# Patient Record
Sex: Female | Born: 1960 | Race: White | Hispanic: No | Marital: Married | Smoking: Former smoker
Health system: Southern US, Community
[De-identification: ages and names within clinical notes are randomized; demographics above are authoritative.]

## PROBLEM LIST (undated history)

## (undated) DIAGNOSIS — N879 Dysplasia of cervix uteri, unspecified: Secondary | ICD-10-CM

## (undated) DIAGNOSIS — R51 Headache: Secondary | ICD-10-CM

## (undated) DIAGNOSIS — G894 Chronic pain syndrome: Secondary | ICD-10-CM

## (undated) DIAGNOSIS — R1110 Vomiting, unspecified: Secondary | ICD-10-CM

## (undated) DIAGNOSIS — I10 Essential (primary) hypertension: Secondary | ICD-10-CM

## (undated) DIAGNOSIS — J449 Chronic obstructive pulmonary disease, unspecified: Secondary | ICD-10-CM

## (undated) DIAGNOSIS — G2581 Restless legs syndrome: Secondary | ICD-10-CM

## (undated) DIAGNOSIS — N200 Calculus of kidney: Secondary | ICD-10-CM

## (undated) DIAGNOSIS — K219 Gastro-esophageal reflux disease without esophagitis: Secondary | ICD-10-CM

## (undated) DIAGNOSIS — E785 Hyperlipidemia, unspecified: Secondary | ICD-10-CM

## (undated) DIAGNOSIS — F329 Major depressive disorder, single episode, unspecified: Secondary | ICD-10-CM

## (undated) DIAGNOSIS — F419 Anxiety disorder, unspecified: Secondary | ICD-10-CM

## (undated) DIAGNOSIS — F32A Depression, unspecified: Secondary | ICD-10-CM

## (undated) DIAGNOSIS — K5792 Diverticulitis of intestine, part unspecified, without perforation or abscess without bleeding: Secondary | ICD-10-CM

## (undated) DIAGNOSIS — J189 Pneumonia, unspecified organism: Secondary | ICD-10-CM

## (undated) DIAGNOSIS — E119 Type 2 diabetes mellitus without complications: Secondary | ICD-10-CM

## (undated) DIAGNOSIS — M797 Fibromyalgia: Secondary | ICD-10-CM

## (undated) DIAGNOSIS — J42 Unspecified chronic bronchitis: Secondary | ICD-10-CM

## (undated) DIAGNOSIS — C541 Malignant neoplasm of endometrium: Secondary | ICD-10-CM

## (undated) HISTORY — DX: Pneumonia, unspecified organism: J18.9

## (undated) HISTORY — PX: PARTIAL HYSTERECTOMY: SHX80

## (undated) HISTORY — DX: Chronic obstructive pulmonary disease, unspecified: J44.9

## (undated) HISTORY — DX: Dysplasia of cervix uteri, unspecified: N87.9

## (undated) HISTORY — PX: COLOSTOMY: SHX63

## (undated) HISTORY — PX: APPENDECTOMY: SHX54

## (undated) HISTORY — DX: Gastro-esophageal reflux disease without esophagitis: K21.9

## (undated) HISTORY — PX: CHOLECYSTECTOMY: SHX55

## (undated) HISTORY — PX: ABDOMINAL HYSTERECTOMY: SHX81

---

## 2003-10-21 ENCOUNTER — Other Ambulatory Visit: Admission: RE | Admit: 2003-10-21 | Discharge: 2003-10-21 | Payer: Self-pay | Admitting: *Deleted

## 2007-03-08 ENCOUNTER — Emergency Department (HOSPITAL_COMMUNITY): Admission: EM | Admit: 2007-03-08 | Discharge: 2007-03-08 | Payer: Self-pay | Admitting: Emergency Medicine

## 2007-03-10 ENCOUNTER — Inpatient Hospital Stay (HOSPITAL_COMMUNITY): Admission: AD | Admit: 2007-03-10 | Discharge: 2007-03-14 | Payer: Self-pay | Admitting: Endocrinology

## 2009-06-22 ENCOUNTER — Inpatient Hospital Stay (HOSPITAL_COMMUNITY): Admission: EM | Admit: 2009-06-22 | Discharge: 2009-07-10 | Payer: Self-pay | Admitting: Emergency Medicine

## 2009-07-04 ENCOUNTER — Encounter (INDEPENDENT_AMBULATORY_CARE_PROVIDER_SITE_OTHER): Payer: Self-pay | Admitting: General Surgery

## 2009-11-20 ENCOUNTER — Encounter: Admission: RE | Admit: 2009-11-20 | Discharge: 2009-11-20 | Payer: Self-pay | Admitting: General Surgery

## 2010-03-20 ENCOUNTER — Emergency Department (HOSPITAL_COMMUNITY): Admission: EM | Admit: 2010-03-20 | Discharge: 2010-03-20 | Payer: Self-pay | Admitting: Emergency Medicine

## 2010-03-23 ENCOUNTER — Emergency Department (HOSPITAL_COMMUNITY): Admission: EM | Admit: 2010-03-23 | Discharge: 2010-03-23 | Payer: Self-pay | Admitting: Emergency Medicine

## 2010-04-26 ENCOUNTER — Emergency Department (HOSPITAL_COMMUNITY): Admission: EM | Admit: 2010-04-26 | Discharge: 2010-04-26 | Payer: Self-pay | Admitting: Emergency Medicine

## 2010-04-30 ENCOUNTER — Emergency Department (HOSPITAL_COMMUNITY): Admission: EM | Admit: 2010-04-30 | Discharge: 2010-04-30 | Payer: Self-pay | Admitting: Emergency Medicine

## 2010-10-11 ENCOUNTER — Encounter: Payer: Self-pay | Admitting: General Surgery

## 2010-11-30 ENCOUNTER — Emergency Department (HOSPITAL_COMMUNITY)
Admission: EM | Admit: 2010-11-30 | Discharge: 2010-11-30 | Disposition: A | Discharge status: Home / Self Care | Payer: 59 | Attending: Emergency Medicine | Admitting: Emergency Medicine

## 2010-11-30 ENCOUNTER — Emergency Department (HOSPITAL_COMMUNITY): Payer: 59

## 2010-11-30 DIAGNOSIS — R109 Unspecified abdominal pain: Secondary | ICD-10-CM | POA: Insufficient documentation

## 2010-11-30 DIAGNOSIS — Z79899 Other long term (current) drug therapy: Secondary | ICD-10-CM | POA: Insufficient documentation

## 2010-11-30 DIAGNOSIS — N201 Calculus of ureter: Secondary | ICD-10-CM | POA: Insufficient documentation

## 2010-11-30 DIAGNOSIS — R112 Nausea with vomiting, unspecified: Secondary | ICD-10-CM | POA: Insufficient documentation

## 2010-11-30 DIAGNOSIS — N200 Calculus of kidney: Secondary | ICD-10-CM | POA: Insufficient documentation

## 2010-11-30 DIAGNOSIS — E669 Obesity, unspecified: Secondary | ICD-10-CM | POA: Insufficient documentation

## 2010-11-30 DIAGNOSIS — K219 Gastro-esophageal reflux disease without esophagitis: Secondary | ICD-10-CM | POA: Insufficient documentation

## 2010-11-30 LAB — DIFFERENTIAL
Basophils Absolute: 0 10*3/uL (ref 0.0–0.1)
Basophils Relative: 0 % (ref 0–1)
Eosinophils Absolute: 0.1 10*3/uL (ref 0.0–0.7)
Eosinophils Relative: 1 % (ref 0–5)
Lymphocytes Relative: 16 % (ref 12–46)
Lymphs Abs: 1.5 10*3/uL (ref 0.7–4.0)
Monocytes Absolute: 0.8 10*3/uL (ref 0.1–1.0)
Monocytes Relative: 8 % (ref 3–12)
Neutro Abs: 7.3 10*3/uL (ref 1.7–7.7)
Neutrophils Relative %: 75 % (ref 43–77)

## 2010-11-30 LAB — BASIC METABOLIC PANEL
BUN: 12 mg/dL (ref 6–23)
CO2: 22 mEq/L (ref 19–32)
Calcium: 8.6 mg/dL (ref 8.4–10.5)
Chloride: 109 mEq/L (ref 96–112)
Creatinine, Ser: 0.72 mg/dL (ref 0.4–1.2)
GFR calc Af Amer: >60 mL/min (ref >60)
GFR calc non Af Amer: >60 mL/min (ref >60)
Glucose, Bld: 147 mg/dL — ABNORMAL HIGH (ref 70–99)
Potassium: 3.7 mEq/L (ref 3.5–5.1)
Sodium: 139 mEq/L (ref 135–145)

## 2010-11-30 LAB — CBC
HCT: 40.6 % (ref 36.0–46.0)
Hemoglobin: 13.9 g/dL (ref 12.0–15.0)
MCH: 29.8 pg (ref 26.0–34.0)
MCHC: 34.2 g/dL (ref 30.0–36.0)
MCV: 86.9 fL (ref 78.0–100.0)
Platelets: 268 10*3/uL (ref 150–400)
RBC: 4.67 MIL/uL (ref 3.87–5.11)
RDW: 13.9 % (ref 11.5–15.5)
WBC: 9.8 10*3/uL (ref 4.0–10.5)

## 2010-11-30 LAB — URINALYSIS, ROUTINE W REFLEX MICROSCOPIC
Bilirubin Urine: NEGATIVE
Glucose, UA: NEGATIVE mg/dL
Ketones, ur: 15 mg/dL — AB
Nitrite: NEGATIVE
Protein, ur: 100 mg/dL — AB
Specific Gravity, Urine: 1.021 (ref 1.005–1.030)
Urobilinogen, UA: 0.2 mg/dL (ref 0.0–1.0)
pH: 5 (ref 5.0–8.0)

## 2010-11-30 LAB — URINE MICROSCOPIC-ADD ON

## 2010-12-06 LAB — LIPASE, BLOOD: Lipase: 36 U/L (ref 11–59)

## 2010-12-06 LAB — COMPREHENSIVE METABOLIC PANEL
ALT: <8 U/L (ref 0–35)
AST: 18 U/L (ref 0–37)
Albumin: 3.6 g/dL (ref 3.5–5.2)
Alkaline Phosphatase: 83 U/L (ref 39–117)
BUN: 13 mg/dL (ref 6–23)
CO2: 26 mEq/L (ref 19–32)
Calcium: 9.3 mg/dL (ref 8.4–10.5)
Chloride: 109 mEq/L (ref 96–112)
Creatinine, Ser: 1.05 mg/dL (ref 0.4–1.2)
GFR calc Af Amer: >60 mL/min (ref >60)
GFR calc non Af Amer: 55 mL/min — ABNORMAL LOW (ref >60)
Glucose, Bld: 118 mg/dL — ABNORMAL HIGH (ref 70–99)
Potassium: 3.8 mEq/L (ref 3.5–5.1)
Sodium: 144 mEq/L (ref 135–145)
Total Bilirubin: 0.5 mg/dL (ref 0.3–1.2)
Total Protein: 6.6 g/dL (ref 6.0–8.3)

## 2010-12-06 LAB — DIFFERENTIAL
Basophils Absolute: 0.1 10*3/uL (ref 0.0–0.1)
Basophils Relative: 1 % (ref 0–1)
Eosinophils Absolute: 0.1 10*3/uL (ref 0.0–0.7)
Eosinophils Relative: 1 % (ref 0–5)
Lymphocytes Relative: 26 % (ref 12–46)
Lymphs Abs: 2.1 10*3/uL (ref 0.7–4.0)
Monocytes Absolute: 0.6 10*3/uL (ref 0.1–1.0)
Monocytes Relative: 8 % (ref 3–12)
Neutro Abs: 5.3 10*3/uL (ref 1.7–7.7)
Neutrophils Relative %: 65 % (ref 43–77)

## 2010-12-06 LAB — URINALYSIS, ROUTINE W REFLEX MICROSCOPIC
Bilirubin Urine: NEGATIVE
Glucose, UA: NEGATIVE mg/dL
Hgb urine dipstick: NEGATIVE
Ketones, ur: NEGATIVE mg/dL
Nitrite: NEGATIVE
Protein, ur: NEGATIVE mg/dL
Specific Gravity, Urine: 1.023 (ref 1.005–1.030)
Urobilinogen, UA: 0.2 mg/dL (ref 0.0–1.0)
pH: 5 (ref 5.0–8.0)

## 2010-12-06 LAB — CBC
HCT: 42.8 % (ref 36.0–46.0)
Hemoglobin: 14.2 g/dL (ref 12.0–15.0)
MCH: 29.9 pg (ref 26.0–34.0)
MCHC: 33.2 g/dL (ref 30.0–36.0)
MCV: 90 fL (ref 78.0–100.0)
Platelets: 262 10*3/uL (ref 150–400)
RBC: 4.75 MIL/uL (ref 3.87–5.11)
RDW: 13.4 % (ref 11.5–15.5)
WBC: 8.2 10*3/uL (ref 4.0–10.5)

## 2010-12-06 LAB — URINE CULTURE
Colony Count: NO GROWTH
Culture: NO GROWTH

## 2010-12-21 ENCOUNTER — Emergency Department (HOSPITAL_COMMUNITY)
Admission: EM | Admit: 2010-12-21 | Discharge: 2010-12-21 | Disposition: A | Discharge status: Home / Self Care | Payer: 59 | Attending: Emergency Medicine | Admitting: Emergency Medicine

## 2010-12-21 DIAGNOSIS — N23 Unspecified renal colic: Secondary | ICD-10-CM | POA: Insufficient documentation

## 2010-12-21 DIAGNOSIS — R110 Nausea: Secondary | ICD-10-CM | POA: Insufficient documentation

## 2010-12-21 DIAGNOSIS — Z79899 Other long term (current) drug therapy: Secondary | ICD-10-CM | POA: Insufficient documentation

## 2010-12-21 DIAGNOSIS — R109 Unspecified abdominal pain: Secondary | ICD-10-CM | POA: Insufficient documentation

## 2010-12-21 DIAGNOSIS — R319 Hematuria, unspecified: Secondary | ICD-10-CM | POA: Insufficient documentation

## 2010-12-21 DIAGNOSIS — IMO0001 Reserved for inherently not codable concepts without codable children: Secondary | ICD-10-CM | POA: Insufficient documentation

## 2010-12-21 DIAGNOSIS — K219 Gastro-esophageal reflux disease without esophagitis: Secondary | ICD-10-CM | POA: Insufficient documentation

## 2010-12-21 LAB — POCT I-STAT, CHEM 8
BUN: 18 mg/dL (ref 6–23)
Calcium, Ion: 1.18 mmol/L (ref 1.12–1.32)
Chloride: 107 mEq/L (ref 96–112)
Creatinine, Ser: 0.9 mg/dL (ref 0.4–1.2)
Glucose, Bld: 105 mg/dL — ABNORMAL HIGH (ref 70–99)
HCT: 43 % (ref 36.0–46.0)
Hemoglobin: 14.6 g/dL (ref 12.0–15.0)
Potassium: 3.8 mEq/L (ref 3.5–5.1)
Sodium: 141 mEq/L (ref 135–145)
TCO2: 24 mmol/L (ref 0–100)

## 2010-12-21 LAB — POCT PREGNANCY, URINE: Preg Test, Ur: NEGATIVE

## 2010-12-21 LAB — URINALYSIS, ROUTINE W REFLEX MICROSCOPIC
Bilirubin Urine: NEGATIVE
Glucose, UA: NEGATIVE mg/dL
Ketones, ur: NEGATIVE mg/dL
Leukocytes, UA: NEGATIVE
Nitrite: NEGATIVE
Protein, ur: NEGATIVE mg/dL
Specific Gravity, Urine: 1.025 (ref 1.005–1.030)
Urobilinogen, UA: 0.2 mg/dL (ref 0.0–1.0)
pH: 5.5 (ref 5.0–8.0)

## 2010-12-21 LAB — URINE MICROSCOPIC-ADD ON

## 2010-12-24 LAB — CBC
HCT: 26.7 % — ABNORMAL LOW (ref 36.0–46.0)
HCT: 28.9 % — ABNORMAL LOW (ref 36.0–46.0)
HCT: 33.8 % — ABNORMAL LOW (ref 36.0–46.0)
HCT: 29.7 % — ABNORMAL LOW (ref 36.0–46.0)
HCT: 30 % — ABNORMAL LOW (ref 36.0–46.0)
HCT: 30.5 % — ABNORMAL LOW (ref 36.0–46.0)
HCT: 30.7 % — ABNORMAL LOW (ref 36.0–46.0)
HCT: 31.6 % — ABNORMAL LOW (ref 36.0–46.0)
HCT: 32.2 % — ABNORMAL LOW (ref 36.0–46.0)
HCT: 33 % — ABNORMAL LOW (ref 36.0–46.0)
HCT: 33.7 % — ABNORMAL LOW (ref 36.0–46.0)
HCT: 34.8 % — ABNORMAL LOW (ref 36.0–46.0)
HCT: 34.8 % — ABNORMAL LOW (ref 36.0–46.0)
HCT: 39.7 % (ref 36.0–46.0)
Hemoglobin: 11.4 g/dL — ABNORMAL LOW (ref 12.0–15.0)
Hemoglobin: 8.9 g/dL — ABNORMAL LOW (ref 12.0–15.0)
Hemoglobin: 9.5 g/dL — ABNORMAL LOW (ref 12.0–15.0)
Hemoglobin: 10 g/dL — ABNORMAL LOW (ref 12.0–15.0)
Hemoglobin: 10.2 g/dL — ABNORMAL LOW (ref 12.0–15.0)
Hemoglobin: 10.3 g/dL — ABNORMAL LOW (ref 12.0–15.0)
Hemoglobin: 10.4 g/dL — ABNORMAL LOW (ref 12.0–15.0)
Hemoglobin: 10.7 g/dL — ABNORMAL LOW (ref 12.0–15.0)
Hemoglobin: 11 g/dL — ABNORMAL LOW (ref 12.0–15.0)
Hemoglobin: 11.1 g/dL — ABNORMAL LOW (ref 12.0–15.0)
Hemoglobin: 11.5 g/dL — ABNORMAL LOW (ref 12.0–15.0)
Hemoglobin: 11.6 g/dL — ABNORMAL LOW (ref 12.0–15.0)
Hemoglobin: 13.2 g/dL (ref 12.0–15.0)
Hemoglobin: 9.9 g/dL — ABNORMAL LOW (ref 12.0–15.0)
MCHC: 32.8 g/dL (ref 30.0–36.0)
MCHC: 33.2 g/dL (ref 30.0–36.0)
MCHC: 33.7 g/dL (ref 30.0–36.0)
MCHC: 33 g/dL (ref 30.0–36.0)
MCHC: 33 g/dL (ref 30.0–36.0)
MCHC: 33 g/dL (ref 30.0–36.0)
MCHC: 33.2 g/dL (ref 30.0–36.0)
MCHC: 33.2 g/dL (ref 30.0–36.0)
MCHC: 33.3 g/dL (ref 30.0–36.0)
MCHC: 33.3 g/dL (ref 30.0–36.0)
MCHC: 33.3 g/dL (ref 30.0–36.0)
MCHC: 33.4 g/dL (ref 30.0–36.0)
MCHC: 33.5 g/dL (ref 30.0–36.0)
MCHC: 33.7 g/dL (ref 30.0–36.0)
MCV: 84.8 fL (ref 78.0–100.0)
MCV: 85.1 fL (ref 78.0–100.0)
MCV: 85.1 fL (ref 78.0–100.0)
MCV: 84.5 fL (ref 78.0–100.0)
MCV: 84.8 fL (ref 78.0–100.0)
MCV: 84.8 fL (ref 78.0–100.0)
MCV: 84.9 fL (ref 78.0–100.0)
MCV: 85 fL (ref 78.0–100.0)
MCV: 85.1 fL (ref 78.0–100.0)
MCV: 85.3 fL (ref 78.0–100.0)
MCV: 85.3 fL (ref 78.0–100.0)
MCV: 85.5 fL (ref 78.0–100.0)
MCV: 85.8 fL (ref 78.0–100.0)
MCV: 86 fL (ref 78.0–100.0)
Platelets: 308 10*3/uL (ref 150–400)
Platelets: 390 10*3/uL (ref 150–400)
Platelets: ADEQUATE 10*3/uL (ref 150–400)
Platelets: 197 10*3/uL (ref 150–400)
Platelets: 202 10*3/uL (ref 150–400)
Platelets: 282 10*3/uL (ref 150–400)
Platelets: 294 10*3/uL (ref 150–400)
Platelets: 301 10*3/uL (ref 150–400)
Platelets: 302 10*3/uL (ref 150–400)
Platelets: 314 10*3/uL (ref 150–400)
Platelets: 326 10*3/uL (ref 150–400)
Platelets: 346 10*3/uL (ref 150–400)
Platelets: 360 10*3/uL (ref 150–400)
Platelets: 377 10*3/uL (ref 150–400)
RBC: 3.15 MIL/uL — ABNORMAL LOW (ref 3.87–5.11)
RBC: 3.39 MIL/uL — ABNORMAL LOW (ref 3.87–5.11)
RBC: 3.97 MIL/uL (ref 3.87–5.11)
RBC: 3.49 MIL/uL — ABNORMAL LOW (ref 3.87–5.11)
RBC: 3.55 MIL/uL — ABNORMAL LOW (ref 3.87–5.11)
RBC: 3.59 MIL/uL — ABNORMAL LOW (ref 3.87–5.11)
RBC: 3.61 MIL/uL — ABNORMAL LOW (ref 3.87–5.11)
RBC: 3.7 MIL/uL — ABNORMAL LOW (ref 3.87–5.11)
RBC: 3.74 MIL/uL — ABNORMAL LOW (ref 3.87–5.11)
RBC: 3.86 MIL/uL — ABNORMAL LOW (ref 3.87–5.11)
RBC: 3.93 MIL/uL (ref 3.87–5.11)
RBC: 4.08 MIL/uL (ref 3.87–5.11)
RBC: 4.09 MIL/uL (ref 3.87–5.11)
RBC: 4.68 MIL/uL (ref 3.87–5.11)
RDW: 14.1 % (ref 11.5–15.5)
RDW: 14.4 % (ref 11.5–15.5)
RDW: 14.5 % (ref 11.5–15.5)
RDW: 14 % (ref 11.5–15.5)
RDW: 14 % (ref 11.5–15.5)
RDW: 14 % (ref 11.5–15.5)
RDW: 14.1 % (ref 11.5–15.5)
RDW: 14.1 % (ref 11.5–15.5)
RDW: 14.1 % (ref 11.5–15.5)
RDW: 14.1 % (ref 11.5–15.5)
RDW: 14.2 % (ref 11.5–15.5)
RDW: 14.2 % (ref 11.5–15.5)
RDW: 14.4 % (ref 11.5–15.5)
RDW: 14.5 % (ref 11.5–15.5)
WBC: 18.3 10*3/uL — ABNORMAL HIGH (ref 4.0–10.5)
WBC: 7.5 10*3/uL (ref 4.0–10.5)
WBC: 9 10*3/uL (ref 4.0–10.5)
WBC: 11.3 10*3/uL — ABNORMAL HIGH (ref 4.0–10.5)
WBC: 12 10*3/uL — ABNORMAL HIGH (ref 4.0–10.5)
WBC: 18 10*3/uL — ABNORMAL HIGH (ref 4.0–10.5)
WBC: 7.6 10*3/uL (ref 4.0–10.5)
WBC: 7.7 10*3/uL (ref 4.0–10.5)
WBC: 7.9 10*3/uL (ref 4.0–10.5)
WBC: 8.6 10*3/uL (ref 4.0–10.5)
WBC: 9.1 10*3/uL (ref 4.0–10.5)
WBC: 9.4 10*3/uL (ref 4.0–10.5)
WBC: 9.4 10*3/uL (ref 4.0–10.5)
WBC: 9.8 10*3/uL (ref 4.0–10.5)

## 2010-12-24 LAB — BASIC METABOLIC PANEL
BUN: 5 mg/dL — ABNORMAL LOW (ref 6–23)
BUN: 6 mg/dL (ref 6–23)
BUN: 7 mg/dL (ref 6–23)
BUN: 5 mg/dL — ABNORMAL LOW (ref 6–23)
BUN: 5 mg/dL — ABNORMAL LOW (ref 6–23)
BUN: 6 mg/dL (ref 6–23)
BUN: 6 mg/dL (ref 6–23)
BUN: 6 mg/dL (ref 6–23)
BUN: 7 mg/dL (ref 6–23)
BUN: 7 mg/dL (ref 6–23)
BUN: 7 mg/dL (ref 6–23)
CO2: 27 mEq/L (ref 19–32)
CO2: 27 mEq/L (ref 19–32)
CO2: 29 mEq/L (ref 19–32)
CO2: 26 mEq/L (ref 19–32)
CO2: 26 mEq/L (ref 19–32)
CO2: 27 mEq/L (ref 19–32)
CO2: 28 mEq/L (ref 19–32)
CO2: 28 mEq/L (ref 19–32)
CO2: 29 mEq/L (ref 19–32)
CO2: 30 mEq/L (ref 19–32)
CO2: 30 mEq/L (ref 19–32)
Calcium: 8 mg/dL — ABNORMAL LOW (ref 8.4–10.5)
Calcium: 8.1 mg/dL — ABNORMAL LOW (ref 8.4–10.5)
Calcium: 8.7 mg/dL (ref 8.4–10.5)
Calcium: 8.2 mg/dL — ABNORMAL LOW (ref 8.4–10.5)
Calcium: 8.3 mg/dL — ABNORMAL LOW (ref 8.4–10.5)
Calcium: 8.5 mg/dL (ref 8.4–10.5)
Calcium: 8.6 mg/dL (ref 8.4–10.5)
Calcium: 8.7 mg/dL (ref 8.4–10.5)
Calcium: 8.7 mg/dL (ref 8.4–10.5)
Calcium: 8.7 mg/dL (ref 8.4–10.5)
Calcium: 8.8 mg/dL (ref 8.4–10.5)
Chloride: 101 mEq/L (ref 96–112)
Chloride: 104 mEq/L (ref 96–112)
Chloride: 105 mEq/L (ref 96–112)
Chloride: 100 mEq/L (ref 96–112)
Chloride: 101 mEq/L (ref 96–112)
Chloride: 101 mEq/L (ref 96–112)
Chloride: 102 mEq/L (ref 96–112)
Chloride: 103 mEq/L (ref 96–112)
Chloride: 104 mEq/L (ref 96–112)
Chloride: 104 mEq/L (ref 96–112)
Chloride: 107 mEq/L (ref 96–112)
Creatinine, Ser: 0.57 mg/dL (ref 0.4–1.2)
Creatinine, Ser: 0.6 mg/dL (ref 0.4–1.2)
Creatinine, Ser: 0.74 mg/dL (ref 0.4–1.2)
Creatinine, Ser: 0.61 mg/dL (ref 0.4–1.2)
Creatinine, Ser: 0.62 mg/dL (ref 0.4–1.2)
Creatinine, Ser: 0.65 mg/dL (ref 0.4–1.2)
Creatinine, Ser: 0.71 mg/dL (ref 0.4–1.2)
Creatinine, Ser: 0.76 mg/dL (ref 0.4–1.2)
Creatinine, Ser: 0.79 mg/dL (ref 0.4–1.2)
Creatinine, Ser: 0.86 mg/dL (ref 0.4–1.2)
Creatinine, Ser: 0.86 mg/dL (ref 0.4–1.2)
GFR calc Af Amer: >60 mL/min (ref >60)
GFR calc Af Amer: >60 mL/min (ref >60)
GFR calc Af Amer: >60 mL/min (ref >60)
GFR calc Af Amer: >60 mL/min (ref >60)
GFR calc Af Amer: >60 mL/min (ref >60)
GFR calc Af Amer: >60 mL/min (ref >60)
GFR calc Af Amer: >60 mL/min (ref >60)
GFR calc Af Amer: >60 mL/min (ref >60)
GFR calc Af Amer: >60 mL/min (ref >60)
GFR calc Af Amer: >60 mL/min (ref >60)
GFR calc Af Amer: >60 mL/min (ref >60)
GFR calc non Af Amer: >60 mL/min (ref >60)
GFR calc non Af Amer: >60 mL/min (ref >60)
GFR calc non Af Amer: >60 mL/min (ref >60)
GFR calc non Af Amer: >60 mL/min (ref >60)
GFR calc non Af Amer: >60 mL/min (ref >60)
GFR calc non Af Amer: >60 mL/min (ref >60)
GFR calc non Af Amer: >60 mL/min (ref >60)
GFR calc non Af Amer: >60 mL/min (ref >60)
GFR calc non Af Amer: >60 mL/min (ref >60)
GFR calc non Af Amer: >60 mL/min (ref >60)
GFR calc non Af Amer: >60 mL/min (ref >60)
Glucose, Bld: 127 mg/dL — ABNORMAL HIGH (ref 70–99)
Glucose, Bld: 87 mg/dL (ref 70–99)
Glucose, Bld: 97 mg/dL (ref 70–99)
Glucose, Bld: 100 mg/dL — ABNORMAL HIGH (ref 70–99)
Glucose, Bld: 105 mg/dL — ABNORMAL HIGH (ref 70–99)
Glucose, Bld: 106 mg/dL — ABNORMAL HIGH (ref 70–99)
Glucose, Bld: 112 mg/dL — ABNORMAL HIGH (ref 70–99)
Glucose, Bld: 117 mg/dL — ABNORMAL HIGH (ref 70–99)
Glucose, Bld: 120 mg/dL — ABNORMAL HIGH (ref 70–99)
Glucose, Bld: 123 mg/dL — ABNORMAL HIGH (ref 70–99)
Glucose, Bld: 132 mg/dL — ABNORMAL HIGH (ref 70–99)
Potassium: 3.1 mEq/L — ABNORMAL LOW (ref 3.5–5.1)
Potassium: 3.5 mEq/L (ref 3.5–5.1)
Potassium: 4.2 mEq/L (ref 3.5–5.1)
Potassium: 3.2 mEq/L — ABNORMAL LOW (ref 3.5–5.1)
Potassium: 3.4 mEq/L — ABNORMAL LOW (ref 3.5–5.1)
Potassium: 3.6 mEq/L (ref 3.5–5.1)
Potassium: 3.7 mEq/L (ref 3.5–5.1)
Potassium: 3.8 mEq/L (ref 3.5–5.1)
Potassium: 4 mEq/L (ref 3.5–5.1)
Potassium: 4 mEq/L (ref 3.5–5.1)
Potassium: 4.1 mEq/L (ref 3.5–5.1)
Sodium: 137 mEq/L (ref 135–145)
Sodium: 139 mEq/L (ref 135–145)
Sodium: 141 mEq/L (ref 135–145)
Sodium: 135 mEq/L (ref 135–145)
Sodium: 136 mEq/L (ref 135–145)
Sodium: 137 mEq/L (ref 135–145)
Sodium: 138 mEq/L (ref 135–145)
Sodium: 138 mEq/L (ref 135–145)
Sodium: 139 mEq/L (ref 135–145)
Sodium: 139 mEq/L (ref 135–145)
Sodium: 140 mEq/L (ref 135–145)

## 2010-12-24 LAB — COMPREHENSIVE METABOLIC PANEL
ALT: <8 U/L (ref 0–35)
ALT: <8 U/L (ref 0–35)
ALT: <8 U/L (ref 0–35)
AST: 11 U/L (ref 0–37)
AST: 12 U/L (ref 0–37)
AST: 13 U/L (ref 0–37)
Albumin: 2.2 g/dL — ABNORMAL LOW (ref 3.5–5.2)
Albumin: 2.4 g/dL — ABNORMAL LOW (ref 3.5–5.2)
Albumin: 3.1 g/dL — ABNORMAL LOW (ref 3.5–5.2)
Alkaline Phosphatase: 54 U/L (ref 39–117)
Alkaline Phosphatase: 60 U/L (ref 39–117)
Alkaline Phosphatase: 89 U/L (ref 39–117)
BUN: 10 mg/dL (ref 6–23)
BUN: 5 mg/dL — ABNORMAL LOW (ref 6–23)
BUN: 6 mg/dL (ref 6–23)
CO2: 27 mEq/L (ref 19–32)
CO2: 29 mEq/L (ref 19–32)
CO2: 29 mEq/L (ref 19–32)
Calcium: 8.5 mg/dL (ref 8.4–10.5)
Calcium: 8.9 mg/dL (ref 8.4–10.5)
Calcium: 9.2 mg/dL (ref 8.4–10.5)
Chloride: 102 mEq/L (ref 96–112)
Chloride: 104 mEq/L (ref 96–112)
Chloride: 106 mEq/L (ref 96–112)
Creatinine, Ser: 0.74 mg/dL (ref 0.4–1.2)
Creatinine, Ser: 0.78 mg/dL (ref 0.4–1.2)
Creatinine, Ser: 0.86 mg/dL (ref 0.4–1.2)
GFR calc Af Amer: >60 mL/min (ref >60)
GFR calc Af Amer: >60 mL/min (ref >60)
GFR calc Af Amer: >60 mL/min (ref >60)
GFR calc non Af Amer: >60 mL/min (ref >60)
GFR calc non Af Amer: >60 mL/min (ref >60)
GFR calc non Af Amer: >60 mL/min (ref >60)
Glucose, Bld: 115 mg/dL — ABNORMAL HIGH (ref 70–99)
Glucose, Bld: 121 mg/dL — ABNORMAL HIGH (ref 70–99)
Glucose, Bld: 94 mg/dL (ref 70–99)
Potassium: 3.6 mEq/L (ref 3.5–5.1)
Potassium: 3.9 mEq/L (ref 3.5–5.1)
Potassium: 4.1 mEq/L (ref 3.5–5.1)
Sodium: 137 mEq/L (ref 135–145)
Sodium: 140 mEq/L (ref 135–145)
Sodium: 141 mEq/L (ref 135–145)
Total Bilirubin: 0.3 mg/dL (ref 0.3–1.2)
Total Bilirubin: 0.4 mg/dL (ref 0.3–1.2)
Total Bilirubin: 0.6 mg/dL (ref 0.3–1.2)
Total Protein: 5.5 g/dL — ABNORMAL LOW (ref 6.0–8.3)
Total Protein: 6.3 g/dL (ref 6.0–8.3)
Total Protein: 7.6 g/dL (ref 6.0–8.3)

## 2010-12-24 LAB — URINALYSIS, ROUTINE W REFLEX MICROSCOPIC
Glucose, UA: NEGATIVE mg/dL
Ketones, ur: NEGATIVE mg/dL
Leukocytes, UA: NEGATIVE
Nitrite: NEGATIVE
Protein, ur: NEGATIVE mg/dL
Specific Gravity, Urine: 1.022 (ref 1.005–1.030)
Urobilinogen, UA: 2 mg/dL — ABNORMAL HIGH (ref 0.0–1.0)
pH: 5.5 (ref 5.0–8.0)

## 2010-12-24 LAB — CULTURE, ROUTINE-ABSCESS

## 2010-12-24 LAB — URINE MICROSCOPIC-ADD ON

## 2010-12-24 LAB — RETICULOCYTES
RBC.: 4.26 MIL/uL (ref 3.87–5.11)
Retic Count, Absolute: 59.6 10*3/uL (ref 19.0–186.0)
Retic Ct Pct: 1.4 % (ref 0.4–3.1)

## 2010-12-24 LAB — ABO/RH: ABO/RH(D): A POS

## 2010-12-24 LAB — DIFFERENTIAL
Basophils Absolute: 0.1 10*3/uL (ref 0.0–0.1)
Basophils Relative: 1 % (ref 0–1)
Eosinophils Absolute: 0.1 10*3/uL (ref 0.0–0.7)
Eosinophils Relative: 1 % (ref 0–5)
Lymphocytes Relative: 9 % — ABNORMAL LOW (ref 12–46)
Lymphs Abs: 1.6 10*3/uL (ref 0.7–4.0)
Monocytes Absolute: 1.2 10*3/uL — ABNORMAL HIGH (ref 0.1–1.0)
Monocytes Relative: 7 % (ref 3–12)
Neutro Abs: 14.9 10*3/uL — ABNORMAL HIGH (ref 1.7–7.7)
Neutrophils Relative %: 83 % — ABNORMAL HIGH (ref 43–77)

## 2010-12-24 LAB — LIPASE, BLOOD: Lipase: 15 U/L (ref 11–59)

## 2010-12-24 LAB — TYPE AND SCREEN
ABO/RH(D): A POS
Antibody Screen: NEGATIVE

## 2010-12-24 LAB — ANAEROBIC CULTURE

## 2010-12-24 LAB — IRON AND TIBC
Iron: 24 ug/dL — ABNORMAL LOW (ref 42–135)
Saturation Ratios: 12 % — ABNORMAL LOW (ref 20–55)
TIBC: 203 ug/dL — ABNORMAL LOW (ref 250–470)
UIBC: 179 ug/dL

## 2010-12-24 LAB — PROTIME-INR
INR: 1.05 (ref 0.00–1.49)
Prothrombin Time: 13.6 seconds (ref 11.6–15.2)

## 2010-12-24 LAB — FERRITIN: Ferritin: 157 ng/mL (ref 10–291)

## 2010-12-24 LAB — FOLATE: Folate: 10.6 ng/mL

## 2010-12-24 LAB — VITAMIN B12: Vitamin B-12: 362 pg/mL (ref 211–911)

## 2010-12-24 LAB — APTT: aPTT: 39 seconds — ABNORMAL HIGH (ref 24–37)

## 2011-02-02 NOTE — Consult Note (Signed)
NAMEBETUL, [name] NO.:  [id number]   MEDICAL RECORD NO.:  [id number]          PATIENT TYPE:  INP   LOCATION:  6703                         FACILITY:  [facility]   PHYSICIAN:  Cherylynn Ridges, M.D.    DATE OF BIRTH:  Jun 09, 1961   DATE OF CONSULTATION:  03/10/2007  DATE OF DISCHARGE:                                 CONSULTATION   REASON FOR CONSULTATION:  Thank you for asking me to see [name], a  50 year old recently diagnosed with acute diverticulitis, who comes in  because of pain, unresponsive to outpatient management of  diverticulitis.   HISTORY OF PRESENT ILLNESS:  The patient started having these discovered  2-3 days ago, has worsened, even with outpatient oral antibiotic  management.  Repeat CAT scan has not done, but plain films recently  showed no free air or evidence of peritonitis.  A surgical consultation  was obtained.   PAST MEDICAL HISTORY:  1. Reflux disease.  2. Irritable bowel syndrome.  3. Asthma.  4. Fibromyalgia.   MEDICATIONS:  1. Albuterol p.r.n.  2. Hydrocodone.  3. Some type of medicine for reflux disease.   PAST SURGICAL HISTORY:  1. Appendectomy.  2. Partial oophorectomy and salpingectomy on the right side.  3. Partial hysterectomy.   REVIEW OF SYSTEMS:  No blood in the stool, no constipation.  She wants  to eat and has a normal appetite.  She says the pain is severe.  However, she laughs and coughs and jumps around in bed as if she were  normal.   PHYSICAL EXAMINATION:  VITAL SIGNS:  Afebrile.  Vital signs stable.  GENERAL:  She does not look toxic.  SKIN:  Normal.  LUNGS:  Clear to auscultation.  CARDIAC:  Regular rhythm and rate with no murmurs, gallops, thrills or  heaves.  ABDOMEN:  Soft with no palpable masses.  She is minimally tender in the  left lower quadrant without rebound or guarding.  RECTAL:  Exam was not performed.   LABORATORIES:  Normal white blood cell count of 9.2, hemoglobin of 14.5,  hematocrit  42.  Her electrolytes were within normal.   IMPRESSION:  Very mild diverticulitis with pain out of proportion to her  objective findings.  With a history of other processes requiring pain  medication, her response to this acute inflammation may be more than  what would normally be seen.  She certainly does not fit into the  category of someone needing acute surgical intervention.  However, we  will follow her peripherally.  If she should change in her presentation,  she may require operative management.  At this point, if it is acute  disease perforation, would require a colostomy.  I did not discuss that  with the patient this time as she seems totally benign on our  examination, likely not even requiring an elective procedure.  She has  been placed on antibiotics, and this can be continued.  However, I would  be prudent with the use it in that the patient does not appear to be  acutely infected.Cherylynn Ridges, M.D.  Electronically Signed     JOW/MEDQ  D:  03/10/2007  T:  03/11/2007  Job:  [id number]   cc:   Jeannett Senior A. Evlyn Kanner, M.D.

## 2011-02-02 NOTE — H&P (Signed)
NAMESKYELER, SCALESE NO.:  [id number]   MEDICAL RECORD NO.:  [id number]          PATIENT TYPE:  INP   LOCATION:  6703                         FACILITY:  [facility]   PHYSICIAN:  Tera Mater. Evlyn Kanner, M.D. DATE OF BIRTH:  October 08, 1960   DATE OF ADMISSION:  03/10/2007  DATE OF DISCHARGE:                              HISTORY & PHYSICAL   [name] is a 50 year old white female who presents to the office  today.  She was seen two days ago in the emergency room as well and  yesterday in the office.  She awakened early Wednesday morning at 2:00  a.m. with abdominal pain, nausea and vomiting.  Her emergency room  evaluation was several hours and included a CT which showed a left lower  quadrant diverticulitis.  She was given Cipro, Flagyl and fluids as well  as pain medicines at that time.  Yesterday in our office, she had an  increase in her Cipro and Flagyl dosing, pain medicines were increased  and she was to call if she got worse.  In fact, she slept very little  last night.  She has had little p.o. intake except for a little fluids  to take her medicines.  She had no solid intake.  She is still  nauseated.  Her left lower quadrant pain has caused her significant  difficulties.  She is unable to really sit still for an extended period  of time.  She has had  some sweats, but no chills or fevers measured.  Her breathing has been stable.  She has no pleuritic chest pain.  She  has no cardiac chest pain.  She has no neurologic pain.  She has had  little relief with this.  Lying down is about the same as sitting up.  Her bowels have not moved.  She has had no unusual travels or exposure.   PAST MEDICAL HISTORY:  Includes:  1. Chronic bronchitis.  2. Gastroesophageal reflux.  3. Anxiety.  4. Restless legs.  5. Prior abnormal blood pressure.  6. Nephrolithiasis.  7. Fibromyalgia.  8. Hyperlipidemia.   SURGICAL HISTORY:  Includes cholecystectomy and USO as well as cervical  dysplasia.   SOCIAL HISTORY:  She is married without children.  She smokes two packs  a day and continues to.   FAMILY HISTORY:  Mom has diabetes, dad is an alcohol abuser, two sisters  are drug abusers.   MEDICATIONS:  Include Cipro, Flagyl, hydrocodone, Zegerid.   ALLERGIES:  None.   PHYSICAL EXAMINATION:  VITAL SIGNS:  Blood pressure 166/98, pulse 64,  respirations 18, temperature 98, weight is 218 pounds.  GENERAL:  We have an older than stated age-appearing white female  sitting up in mild distress.  HEENT:  Sclerae anicteric.  Face is symmetric.  Extraocular movements  are intact.  No nystagmus.  Oral mucous membranes are moist without  lesions.  Dentition is in fair repair.  NECK:  Supple without JVD, bruits or thyromegaly.  LUNGS:  Clear without wheezes, rales or rhonchi.  No accessory muscle  use.  HEART:  Regular without murmurs.  ABDOMEN:  Minimally distended.  Hypoactive present bowel sounds are  present in all quadrants.  She is tender mildly in the left lower  quadrant.  No rebound is present.  No masses or pulsations could be  detected.  EXTREMITIES:  Shows strong distal pulses with no edema.  Nailbed  perfusion is good.  NEUROLOGICAL:  Patient is awake, alert, maintaining well.  Speech is  clear.  Slight tremor is present.  Mentation appears intact.  SKIN:  Tan with multiple tattoos, it is also warm and dry.   LAB DATA:  We do not have any at the present.   In summary, a 50 year old white female presenting with known  diverticulitis now in a worsening status.  She clearly is not responding  to outpatient therapy.  Fortunately, there is no evidence of perforation  or surgical abdomen at the moment on clinical exam, but clearly an x-ray  needs to be done to rule out free air.  I am going to change her  medications to Zosyn, continue the Flagyl, get a surgical consult  tonight.  She may need a repeat CT depending what the x-ray shows.  She  will have bowel rest,  IV Dilaudid, IV Zofran and Nicotine patch.  She  was given Demerol 50 and 25 Phenergan here in the office for comfort.  I  will be seeing her and following her as the hospitalization continues.           ______________________________  Tera Mater Evlyn Kanner, M.D.     SAS/MEDQ  D:  03/10/2007  T:  03/11/2007  Job:  [id number]

## 2011-02-05 NOTE — Discharge Summary (Signed)
Christina, Braun NO.:  [id number]   MEDICAL RECORD NO.:  [id number]          PATIENT TYPE:  INP   LOCATION:  6703                         FACILITY:  [facility]   PHYSICIAN:  [name], M.D.  DATE OF BIRTH:  Jan 25, 1961   DATE OF ADMISSION:  03/10/2007  DATE OF DISCHARGE:  03/14/2007                               DISCHARGE SUMMARY   DISCHARGE DIAGNOSES:  1. Acute diverticulitis, improving.  2. Fibromyalgia/chronic pain syndrome.  3. Nicotine addiction.  4. Chronic bronchitis.  5. Gastroesophageal reflux disease.  6. Anxiety disorder.  7. Restless leg syndrome.  8. History of elevated blood pressure.  9. History of nephrolithiasis.  10.Hyperlipidemia.   DISCHARGE MEDICATIONS:  1. Cipro 500 mg b.i.d. for 8 days.  2. Flagyl 500 mg q.8 h. for 8 days.  3. Phenergan 25 mg q.6 h. p.r.n. nausea.  4. Vicodin one q.6 h. p.r.n. severe pain.  5. Tylenol 650 mg p.r.n. mild to moderate pain.  6. Zegerid daily.   HOSPITAL PROCEDURES:  CT of the abdomen and pelvis with contrast (March 11, 2007):  Stable distal descending colon diverticulitis without  evidence of perforation or abscess.  No change compared to prior CT.   HISTORY OF PRESENT ILLNESS:  For full details, please see dictated  history and physical by Dr. Evlyn Kanner.  Briefly, [name] is a 50 year old  white female with a history of nicotine addiction, fibromyalgia/chronic  pain syndrome, and history of nephrolithiasis, who presented to the  office on June 20 with left lower quadrant abdominal pain.  She was seen  in the emergency department 2 days earlier with similar symptoms and had  a CT performed at that time that showed findings consistent with  diverticulitis without perforation.  She was given Cipro and Flagyl at  that time.  However, her symptoms persisted despite treatment.  She had  severe pain, prompting her to return to the office.  She was admitted  for further management.   HOSPITAL COURSE:   The patient was admitted to the medical bed.  Her  antibiotics were initially changed to IV Zosyn and Flagyl.  Given the  severity of her pain, a repeat CT and surgical consult were obtained.  She was requiring IV Dilaudid and Zofran for her pain initially.  General Surgery saw the patient and felt that she did have mild  diverticulitis which was not surgical in nature.  They recommended  continuing conservative management.  The patient ultimately did show  improvement with IV antibiotics and Zofran.  She was ambulating in the  halls.  At times, she still requested pain medications, despite her  appearance of adequate pain control.  She was requesting to leave the  floor to smoke; this was discouraged and a nicotine patch was placed.  Smoking cessation was advised.  With conservative management and slow  advancing of her diet, her pain improved to the point that she was able  to return home.  Her p.o. antibiotics were resumed prior to admission  and she will complete 14 days of antibiotics.   DISCHARGE INSTRUCTIONS:  She was instructed to call  if she has  increasing abdominal pain, fever, blood in stools.  She was instructed  to stop smoking.   HOSPITAL FOLLOWUP:  She will follow up with Dr. Clelia Croft in 1 week.  She was  given a note to remain out of work until this followup visit due to the  severity of her pain.   DISPOSITION:  To home.      [name], M.D.  Electronically Signed     WS/MEDQ  D:  04/06/2007  T:  04/07/2007  Job:  [id number]

## 2011-07-07 LAB — CBC
HCT: 38
HCT: 38.7
HCT: 41.4
HCT: 42.9
HCT: 45.6
Hemoglobin: 12.8
Hemoglobin: 12.9
Hemoglobin: 13.8
Hemoglobin: 14.5
Hemoglobin: 15.3 — ABNORMAL HIGH
MCHC: 33.3
MCHC: 33.3
MCHC: 33.5
MCHC: 33.8
MCHC: 33.8
MCV: 88.7
MCV: 89
MCV: 89.9
MCV: 90.1
MCV: 90.6
Platelets: 217
Platelets: 230
Platelets: 245
Platelets: 256
Platelets: 277
RBC: 4.27
RBC: 4.27
RBC: 4.6
RBC: 4.84
RBC: 5.07
RDW: 12.9
RDW: 13
RDW: 13.1
RDW: 13.1
RDW: 13.3
WBC: 6
WBC: 6.8
WBC: 7.5
WBC: 7.8
WBC: 9.2

## 2011-07-07 LAB — HEPATIC FUNCTION PANEL
ALT: 9
ALT: <8
ALT: <8
AST: 12
AST: 15
AST: 20
Albumin: 2.8 — ABNORMAL LOW
Albumin: 3 — ABNORMAL LOW
Albumin: 3.5
Alkaline Phosphatase: 55
Alkaline Phosphatase: 58
Alkaline Phosphatase: 66
Bilirubin, Direct: 0.1
Bilirubin, Direct: 0.1
Bilirubin, Direct: 0.2
Indirect Bilirubin: 0.1 — ABNORMAL LOW
Indirect Bilirubin: 0.1 — ABNORMAL LOW
Indirect Bilirubin: 0.5
Total Bilirubin: 0.2 — ABNORMAL LOW
Total Bilirubin: 0.2 — ABNORMAL LOW
Total Bilirubin: 0.7
Total Protein: 5.4 — ABNORMAL LOW
Total Protein: 5.5 — ABNORMAL LOW
Total Protein: 6.4

## 2011-07-07 LAB — BASIC METABOLIC PANEL
BUN: 11
BUN: 14
BUN: 14
BUN: 7
CO2: 25
CO2: 28
CO2: 29
CO2: 29
Calcium: 8.7
Calcium: 8.7
Calcium: 8.9
Calcium: 9.4
Chloride: 105
Chloride: 107
Chloride: 107
Chloride: 108
Creatinine, Ser: 0.73
Creatinine, Ser: 0.79
Creatinine, Ser: 0.84
Creatinine, Ser: 0.91
GFR calc Af Amer: >60
GFR calc Af Amer: >60
GFR calc Af Amer: >60
GFR calc Af Amer: >60
GFR calc non Af Amer: >60
GFR calc non Af Amer: >60
GFR calc non Af Amer: >60
GFR calc non Af Amer: >60
Glucose, Bld: 103 — ABNORMAL HIGH
Glucose, Bld: 115 — ABNORMAL HIGH
Glucose, Bld: 129 — ABNORMAL HIGH
Glucose, Bld: 94
Potassium: 3.5
Potassium: 3.9
Potassium: 4
Potassium: 4
Sodium: 136
Sodium: 140
Sodium: 143
Sodium: 144

## 2011-07-07 LAB — DIFFERENTIAL
Basophils Absolute: 0
Basophils Relative: 1
Eosinophils Absolute: 0.1
Eosinophils Relative: 1
Lymphocytes Relative: 24
Lymphs Abs: 1.8
Monocytes Absolute: 0.5
Monocytes Relative: 7
Neutro Abs: 5.3
Neutrophils Relative %: 68

## 2011-07-07 LAB — CULTURE, BLOOD (ROUTINE X 2)
Culture: NO GROWTH
Culture: NO GROWTH

## 2011-07-07 LAB — I-STAT 8, (EC8 V) (CONVERTED LAB)
Acid-Base Excess: 1
BUN: 15
Bicarbonate: 27.6 — ABNORMAL HIGH
Chloride: 108
Glucose, Bld: 95
HCT: 49 — ABNORMAL HIGH
Hemoglobin: 16.7 — ABNORMAL HIGH
Operator id: 285841
Potassium: 4.3
Sodium: 141
TCO2: 29
pCO2, Ven: 50.7 — ABNORMAL HIGH
pH, Ven: 7.344 — ABNORMAL HIGH

## 2011-07-07 LAB — URINALYSIS, ROUTINE W REFLEX MICROSCOPIC
Bilirubin Urine: NEGATIVE
Glucose, UA: NEGATIVE
Hgb urine dipstick: NEGATIVE
Ketones, ur: NEGATIVE
Nitrite: NEGATIVE
Protein, ur: NEGATIVE
Specific Gravity, Urine: 1.018
Urobilinogen, UA: 0.2
pH: 6.5

## 2011-07-07 LAB — POCT I-STAT CREATININE
Creatinine, Ser: 0.8
Operator id: 285841

## 2011-08-23 ENCOUNTER — Emergency Department (HOSPITAL_COMMUNITY)
Admission: EM | Admit: 2011-08-23 | Discharge: 2011-08-24 | Disposition: A | Discharge status: Home / Self Care | Payer: 59 | Attending: Emergency Medicine | Admitting: Emergency Medicine

## 2011-08-23 ENCOUNTER — Encounter: Payer: Self-pay | Admitting: *Deleted

## 2011-08-23 DIAGNOSIS — K529 Noninfective gastroenteritis and colitis, unspecified: Secondary | ICD-10-CM

## 2011-08-23 DIAGNOSIS — R1032 Left lower quadrant pain: Secondary | ICD-10-CM | POA: Insufficient documentation

## 2011-08-23 DIAGNOSIS — Z933 Colostomy status: Secondary | ICD-10-CM | POA: Insufficient documentation

## 2011-08-23 DIAGNOSIS — Z79899 Other long term (current) drug therapy: Secondary | ICD-10-CM | POA: Insufficient documentation

## 2011-08-23 DIAGNOSIS — K922 Gastrointestinal hemorrhage, unspecified: Secondary | ICD-10-CM | POA: Insufficient documentation

## 2011-08-23 DIAGNOSIS — K5289 Other specified noninfective gastroenteritis and colitis: Secondary | ICD-10-CM | POA: Insufficient documentation

## 2011-08-23 DIAGNOSIS — R10819 Abdominal tenderness, unspecified site: Secondary | ICD-10-CM | POA: Insufficient documentation

## 2011-08-23 DIAGNOSIS — L98499 Non-pressure chronic ulcer of skin of other sites with unspecified severity: Secondary | ICD-10-CM | POA: Insufficient documentation

## 2011-08-23 DIAGNOSIS — R110 Nausea: Secondary | ICD-10-CM | POA: Insufficient documentation

## 2011-08-23 HISTORY — DX: Diverticulitis of intestine, part unspecified, without perforation or abscess without bleeding: K57.92

## 2011-08-23 LAB — CBC
HCT: 41.7 % (ref 36.0–46.0)
Hemoglobin: 14.2 g/dL (ref 12.0–15.0)
MCH: 29.8 pg (ref 26.0–34.0)
MCHC: 34.1 g/dL (ref 30.0–36.0)
MCV: 87.4 fL (ref 78.0–100.0)
Platelets: 221 10*3/uL (ref 150–400)
RBC: 4.77 MIL/uL (ref 3.87–5.11)
RDW: 14.3 % (ref 11.5–15.5)
WBC: 8.9 10*3/uL (ref 4.0–10.5)

## 2011-08-23 LAB — DIFFERENTIAL
Basophils Absolute: 0.1 10*3/uL (ref 0.0–0.1)
Basophils Relative: 1 % (ref 0–1)
Eosinophils Absolute: 0.2 10*3/uL (ref 0.0–0.7)
Eosinophils Relative: 2 % (ref 0–5)
Lymphocytes Relative: 35 % (ref 12–46)
Lymphs Abs: 3.1 10*3/uL (ref 0.7–4.0)
Monocytes Absolute: 0.7 10*3/uL (ref 0.1–1.0)
Monocytes Relative: 8 % (ref 3–12)
Neutro Abs: 4.8 10*3/uL (ref 1.7–7.7)
Neutrophils Relative %: 54 % (ref 43–77)

## 2011-08-23 LAB — OCCULT BLOOD, POC DEVICE: Fecal Occult Bld: POSITIVE

## 2011-08-23 LAB — PROTIME-INR
INR: 0.98 (ref 0.00–1.49)
Prothrombin Time: 13.2 seconds (ref 11.6–15.2)

## 2011-08-23 MED ORDER — FENTANYL CITRATE 0.05 MG/ML IJ SOLN
100.0000 ug | Freq: Once | INTRAMUSCULAR | Status: AC
  Administered 2011-08-23: 100 ug via INTRAVENOUS
  Filled 2011-08-23: qty 2

## 2011-08-23 MED ORDER — ONDANSETRON HCL 4 MG/2ML IJ SOLN
4.0000 mg | Freq: Once | INTRAMUSCULAR | Status: AC
  Administered 2011-08-23: 4 mg via INTRAVENOUS
  Filled 2011-08-23: qty 2

## 2011-08-23 MED ORDER — SODIUM CHLORIDE 0.9 % IV SOLN
20.0000 mL | INTRAVENOUS | Status: DC
  Administered 2011-08-23: 20 mL via INTRAVENOUS

## 2011-08-23 NOTE — ED Notes (Signed)
Patient complaining of bleeding at ostomy site; states that she has had ostomy for two years.  Patient denies taking blood thinners/medications.  States that the bleeding began last night -- patient began to have a stomach ache and diarrhea when the bleeding started.  Patient has a history of diverticulitis, which is the reason she has the ostomy in place (25 inches of colon removed).  Patient describes abdominal pain as "generalized all over", with the worse pain in the lower right and left quadrants.  Describes pain as "cramping", "burning", and "sharp".  Rates pain 10/10 on the numerical pain scale; also reports nausea.  Denies vomiting.  Patient alert and oriented x4; PERRL present.  Upon arrival to room, patient placed in gown and connected to continuous cardiac, pulse ox, and blood pressure monitor.  Will continue to monitor.

## 2011-08-23 NOTE — ED Notes (Signed)
Dr Otter at bedside  

## 2011-08-23 NOTE — ED Notes (Signed)
Patient currently resting quietly in bed; no respiratory or acute distress noted.  Family present at bedside.  Patient updated on plan of care; informed patient that we are currently waiting on the EDP to come and see patient.  Patient has no other questions or concerns at this time.  Will continue to monitor.

## 2011-08-23 NOTE — ED Notes (Signed)
Patient currently sitting up in bed; no respiratory or acute distress noted.  Patient updated on plan of care; informed patient that labs have been put in for blood work.  Dr. Norlene Campbell currently at bedside.  Will continue to monitor.

## 2011-08-23 NOTE — ED Notes (Signed)
To ed for eval of 'straight blood' in colostomy. Pain in lower abd and around ostomy. Pt states this pain started after eating last night.

## 2011-08-24 ENCOUNTER — Emergency Department (HOSPITAL_COMMUNITY): Payer: 59

## 2011-08-24 LAB — COMPREHENSIVE METABOLIC PANEL
ALT: <5 U/L (ref 0–35)
AST: 11 U/L (ref 0–37)
Albumin: 3.3 g/dL — ABNORMAL LOW (ref 3.5–5.2)
Alkaline Phosphatase: 85 U/L (ref 39–117)
BUN: 16 mg/dL (ref 6–23)
CO2: 24 mEq/L (ref 19–32)
Calcium: 9.2 mg/dL (ref 8.4–10.5)
Chloride: 107 mEq/L (ref 96–112)
Creatinine, Ser: 0.77 mg/dL (ref 0.50–1.10)
GFR calc Af Amer: >90 mL/min (ref >90)
GFR calc non Af Amer: >90 mL/min (ref >90)
Glucose, Bld: 142 mg/dL — ABNORMAL HIGH (ref 70–99)
Potassium: 3.2 mEq/L — ABNORMAL LOW (ref 3.5–5.1)
Sodium: 143 mEq/L (ref 135–145)
Total Bilirubin: 0.1 mg/dL — ABNORMAL LOW (ref 0.3–1.2)
Total Protein: 6.5 g/dL (ref 6.0–8.3)

## 2011-08-24 LAB — TYPE AND SCREEN
ABO/RH(D): A POS
Antibody Screen: NEGATIVE

## 2011-08-24 MED ORDER — METRONIDAZOLE 500 MG PO TABS: 500.0000 mg | ORAL_TABLET | Freq: Four times a day (QID) | ORAL | Status: AC

## 2011-08-24 MED ORDER — OXYCODONE-ACETAMINOPHEN 5-325 MG PO TABS: 2.0000 | ORAL_TABLET | ORAL | Status: AC | PRN

## 2011-08-24 MED ORDER — ONDANSETRON HCL 4 MG PO TABS: 4.0000 mg | ORAL_TABLET | Freq: Four times a day (QID) | ORAL | Status: AC

## 2011-08-24 MED ORDER — IOHEXOL 300 MG/ML  SOLN
100.0000 mL | Freq: Once | INTRAMUSCULAR | Status: AC | PRN
  Administered 2011-08-24: 100 mL via INTRAVENOUS

## 2011-08-24 MED ORDER — METRONIDAZOLE IN NACL 5-0.79 MG/ML-% IV SOLN
500.0000 mg | Freq: Once | INTRAVENOUS | Status: AC
  Administered 2011-08-24: 500 mg via INTRAVENOUS
  Filled 2011-08-24: qty 100

## 2011-08-24 MED ORDER — CIPROFLOXACIN IN D5W 400 MG/200ML IV SOLN
400.0000 mg | Freq: Once | INTRAVENOUS | Status: AC
  Administered 2011-08-24: 400 mg via INTRAVENOUS
  Filled 2011-08-24: qty 200

## 2011-08-24 MED ORDER — FENTANYL CITRATE 0.05 MG/ML IJ SOLN
INTRAMUSCULAR | Status: AC
  Filled 2011-08-24: qty 2

## 2011-08-24 MED ORDER — CIPROFLOXACIN HCL 500 MG PO TABS: 750.0000 mg | ORAL_TABLET | Freq: Two times a day (BID) | ORAL | Status: AC

## 2011-08-24 MED ORDER — OXYCODONE-ACETAMINOPHEN 5-325 MG PO TABS
2.0000 | ORAL_TABLET | Freq: Once | ORAL | Status: AC
  Administered 2011-08-24: 2 via ORAL
  Filled 2011-08-24: qty 2

## 2011-08-24 MED ORDER — FENTANYL CITRATE 0.05 MG/ML IJ SOLN
100.0000 ug | Freq: Once | INTRAMUSCULAR | Status: AC
  Administered 2011-08-24: 100 ug via INTRAVENOUS

## 2011-08-24 NOTE — ED Notes (Signed)
Patient being moved from exam room 07 to CDU 01.  Patient updated on plan of care; informed patient that she will be sent for a CT scan in about 30-45 minutes.  Patient has no other questions or concerns at this time.  Family present at bedside.  Will continue to monitor.

## 2011-08-24 NOTE — ED Notes (Signed)
Pt requested pain medication.  Dr. Norlene Campbell notified and orders received.  Pt eating at this time.

## 2011-08-24 NOTE — ED Provider Notes (Addendum)
History     CSN: [id number] Arrival date & time: 08/23/2011  5:25 PM   First MD Initiated Contact with Patient 08/23/11 2304      Chief Complaint  Patient presents with  . GI Bleeding    (Consider location/radiation/quality/duration/timing/severity/associated sxs/prior treatment) HPI 50 year old female presents to emergency department with left lower quadrant pain for 2 days and onset of bleeding per her ostomy. Patient reports she had a meal yesterday and soon after developed painful cramps. Patient initially had very loose stools through her ostomy followed by blood. Patient is also noted bleeding from her umbilicus today. Patient reports had ostomy done 2 years ago secondary to diverticulitis. She's has had one week of mild abdominal pain that she feels like her prior diverticulitis. No fevers, no vomiting, but has had nausea. She has not had diverticulitis since her surgery 2 years ago. Patient is unsure what part of her colon was removed. Surgery was done by Dr. Derrell Lolling. Patient does not have a gastroenterologist. She is followed by Dr. Tiburcio Pea for her primary care. Past Medical History  Diagnosis Date  . Diverticulitis     Past Surgical History  Procedure Date  . Abdominal surgery   . Colostomy     History reviewed. No pertinent family history.  History  Substance Use Topics  . Smoking status: Not on file  . Smokeless tobacco: Not on file  . Alcohol Use:     OB History    Grav Para Term Preterm Abortions TAB SAB Ect Mult Living                  Review of Systems  All other systems reviewed and are negative.    Allergies  Review of patient's allergies indicates no known allergies.  Home Medications   Current Outpatient Rx  Name Route Sig Dispense Refill  . AMITRIPTYLINE HCL 25 MG PO TABS Oral Take 25 mg by mouth at bedtime.      Marland Kitchen DIAZEPAM 10 MG PO TABS Oral Take 10 mg by mouth 3 (three) times daily.      Marland Kitchen OMEPRAZOLE 20 MG PO CPDR Oral Take 20 mg by mouth  daily.      . TRAMADOL HCL 50 MG PO TABS Oral Take 50 mg by mouth every 8 (eight) hours as needed. Maximum dose= 8 tablets per day. pain       BP 128/70  Pulse 82  Temp(Src) 98.2 F (36.8 C) (Oral)  Resp 18  Ht 5\' 5"  (1.651 m)  SpO2 98%  Physical Exam  Nursing note and vitals reviewed. Constitutional: She is oriented to person, place, and time. She appears well-developed and well-nourished.  HENT:  Head: Normocephalic and atraumatic.  Right Ear: External ear normal.  Left Ear: External ear normal.  Nose: Nose normal.  Mouth/Throat: Oropharynx is clear and moist.  Eyes: Conjunctivae and EOM are normal. Pupils are equal, round, and reactive to light.  Neck: Normal range of motion. Neck supple. No JVD present. No tracheal deviation present. No thyromegaly present.  Cardiovascular: Normal rate, regular rhythm, normal heart sounds and intact distal pulses.  Exam reveals no gallop and no friction rub.   No murmur heard. Pulmonary/Chest: Effort normal and breath sounds normal. No stridor. No respiratory distress. She has no wheezes. She has no rales. She exhibits no tenderness.  Abdominal: Soft. Bowel sounds are normal. She exhibits no distension and no mass. There is tenderness (diffuse tenderness across the abdomen slightly more in the left lower quadrant). There  is no rebound and no guarding.       Dried blood noted around the umbilicus, small ulceration seen at the base of the umbilicus  Musculoskeletal: Normal range of motion. She exhibits no edema and no tenderness.  Lymphadenopathy:    She has no cervical adenopathy.  Neurological: She is oriented to person, place, and time. She exhibits normal muscle tone. Coordination normal.  Skin: Skin is dry. No rash noted. No erythema. No pallor.  Psychiatric: She has a normal mood and affect. Her behavior is normal. Judgment and thought content normal.    ED Course  Procedures (including critical care time)  Labs Reviewed  COMPREHENSIVE  METABOLIC PANEL - Abnormal; Notable for the following:    Potassium 3.2 (*)    Glucose, Bld 142 (*)    Albumin 3.3 (*)    Total Bilirubin 0.1 (*)    All other components within normal limits  CBC  DIFFERENTIAL  TYPE AND SCREEN  PROTIME-INR  OCCULT BLOOD, POC DEVICE  POCT OCCULT BLOOD STOOL, DEVICE   Ct Abdomen Pelvis W Contrast  08/24/2011  *RADIOLOGY REPORT*  Clinical Data: Left lower quadrant abdominal pain; history of diverticulitis.  Bright red blood from ostomy.  CT ABDOMEN AND PELVIS WITH CONTRAST  Technique:  Multidetector CT imaging of the abdomen and pelvis was performed following the standard protocol during bolus administration of intravenous contrast.  Contrast: OMNIPAQUE IOHEXOL 300 MG/ML IV SOLN  Comparison: CT of the abdomen and pelvis performed 11/30/2010  Findings: The visualized lung bases are clear.  The liver and spleen are unremarkable in appearance. The patient is status post cholecystectomy, with clips noted at the gallbladder fossa.  The pancreas and adrenal glands are unremarkable.  There is minimal right renal scarring.  A 3 mm stone is noted at the interpole region of the left kidney.  A 0.8 cm cyst is noted at the interpole region of the left kidney on delayed images.  The kidneys are otherwise unremarkable in appearance.  There is no evidence of hydronephrosis.  No ureteral stones are seen.  Note is again made of herniation of a large amount of the small bowel into the patient's left lower quadrant colostomy defect. These loops are relatively stable in appearance, without evidence for obstruction.  The stomach is filled with contrast and is within normal limits. No acute vascular abnormalities are seen.  Mild scattered calcification is noted along the abdominal aorta and its branches.  The appendix is not definitely seen; the patient may be status post appendectomy, or the appendix may be decompressed.  There is no evidence for appendicitis.  There is mild segmental  wall thickening involving the distal transverse colon within the patient's colostomy herniation, with mild associated soft tissue stranding; this could reflect mild colitis.  Given adjacent diverticula, mild diverticulitis might have a similar appearance, though no significant diverticular inflammation is seen.  The bladder is decompressed and not well assessed.  The uterus is grossly unremarkable in appearance.  The ovaries are relatively symmetric, though the right ovary is difficult to fully characterize.  No suspicious adnexal masses are seen.  No inguinal lymphadenopathy is seen.  No acute osseous abnormalities are identified.  IMPRESSION:  1.  Mild segmental wall thickening involving only the distal transverse colon within the patient's colostomy herniation, with mild associated soft tissue stranding; this could reflect mild colitis.  Given adjacent diverticula, mild diverticulitis might have a similar appearance. 2.  Stable herniation of a large amount of the small bowel  into the colostomy defect; no evidence for obstruction. 3.  Small 3 mm left renal stone and small left renal cyst noted. 4.  Mild scattered calcification along the abdominal aorta and its branches.  Original Report Authenticated By: Tonia Ghent, M.D.     No diagnosis found.    MDM  50 year old female with blood in ostomy bag and abdominal pain. Will get full labs and CT abdomen pelvis for further evaluation. The differential includes diverticulitis, worsening of chronic hernia, small bowel obstruction, among others        Olivia Mackie, MD 08/24/11 0126  2:03 AM Patient has had CT scan, awaiting results. Patient with return of pain will redose fentanyl  Olivia Mackie, MD 08/24/11 [id number]  Olivia Mackie, MD 08/24/11 [phone]

## 2011-08-24 NOTE — ED Notes (Signed)
Patient currently resting quietly in bed; no respiratory or acute distress noted.  Family present at bedside.  Will continue to monitor. 

## 2011-08-24 NOTE — ED Notes (Signed)
Gave report to Karen, RN  

## 2011-08-24 NOTE — ED Notes (Signed)
Called CT and notified them that patient has finished drinking her contrast/water cups.  States that they will be on their way to transport patient in 30-45 minutes.

## 2011-08-24 NOTE — ED Notes (Signed)
Ambulatory to bathroom without assist after returning from CT.

## 2011-09-21 HISTORY — PX: COLONOSCOPY: SHX174

## 2011-10-25 ENCOUNTER — Other Ambulatory Visit (HOSPITAL_COMMUNITY): Payer: 59

## 2011-12-13 ENCOUNTER — Inpatient Hospital Stay (HOSPITAL_COMMUNITY): Admission: RE | Admit: 2011-12-13 | Payer: 59 | Source: Ambulatory Visit

## 2011-12-14 ENCOUNTER — Encounter (HOSPITAL_COMMUNITY)
Admission: RE | Admit: 2011-12-14 | Discharge: 2011-12-14 | Disposition: A | Discharge status: Home / Self Care | Payer: 59 | Source: Ambulatory Visit | Attending: Gastroenterology | Admitting: Gastroenterology

## 2011-12-14 ENCOUNTER — Encounter (HOSPITAL_COMMUNITY): Payer: Self-pay | Admitting: *Deleted

## 2011-12-14 NOTE — Anesthesia Preprocedure Evaluation (Addendum)
Anesthesia Evaluation    Airway       Dental   Pulmonary asthma , Current Smoker,          Cardiovascular     Neuro/Psych  Headaches, Anxiety  Neuromuscular disease    GI/Hepatic GERD-  Medicated,  Endo/Other    Renal/GU      Musculoskeletal  (+) Fibromyalgia -  Abdominal (+) + obese,   Peds  Hematology   Anesthesia Other Findings   Reproductive/Obstetrics                           Anesthesia Physical Anesthesia Plan  ASA: II  Anesthesia Plan: MAC   Post-op Pain Management:    Induction:   Airway Management Planned: Nasal Cannula  Additional Equipment:   Intra-op Plan:   Post-operative Plan:   Informed Consent:   Plan Discussed with:   Anesthesia Plan Comments:         Anesthesia Quick Evaluation

## 2011-12-15 ENCOUNTER — Encounter (HOSPITAL_COMMUNITY): Payer: Self-pay | Admitting: *Deleted

## 2011-12-15 ENCOUNTER — Encounter (HOSPITAL_COMMUNITY): Payer: Self-pay | Admitting: Anesthesiology

## 2011-12-15 ENCOUNTER — Encounter (HOSPITAL_COMMUNITY)
Admission: RE | Disposition: A | Discharge status: Home / Self Care | Payer: Self-pay | Source: Ambulatory Visit | Attending: Gastroenterology

## 2011-12-15 ENCOUNTER — Ambulatory Visit (HOSPITAL_COMMUNITY): Payer: 59 | Admitting: Certified Registered Nurse Anesthetist

## 2011-12-15 ENCOUNTER — Ambulatory Visit (HOSPITAL_COMMUNITY)
Admission: RE | Admit: 2011-12-15 | Discharge: 2011-12-15 | Disposition: A | Discharge status: Home / Self Care | Payer: 59 | Source: Ambulatory Visit | Attending: Gastroenterology | Admitting: Gastroenterology

## 2011-12-15 ENCOUNTER — Ambulatory Visit (HOSPITAL_COMMUNITY): Admission: RE | Admit: 2011-12-15 | Payer: 59 | Source: Ambulatory Visit | Admitting: Gastroenterology

## 2011-12-15 ENCOUNTER — Encounter (HOSPITAL_COMMUNITY): Admission: RE | Payer: Self-pay | Source: Ambulatory Visit

## 2011-12-15 DIAGNOSIS — Z79899 Other long term (current) drug therapy: Secondary | ICD-10-CM | POA: Insufficient documentation

## 2011-12-15 DIAGNOSIS — IMO0001 Reserved for inherently not codable concepts without codable children: Secondary | ICD-10-CM | POA: Insufficient documentation

## 2011-12-15 DIAGNOSIS — Z933 Colostomy status: Secondary | ICD-10-CM | POA: Insufficient documentation

## 2011-12-15 DIAGNOSIS — K5730 Diverticulosis of large intestine without perforation or abscess without bleeding: Secondary | ICD-10-CM | POA: Insufficient documentation

## 2011-12-15 DIAGNOSIS — K219 Gastro-esophageal reflux disease without esophagitis: Secondary | ICD-10-CM | POA: Insufficient documentation

## 2011-12-15 DIAGNOSIS — R58 Hemorrhage, not elsewhere classified: Secondary | ICD-10-CM | POA: Insufficient documentation

## 2011-12-15 DIAGNOSIS — F17200 Nicotine dependence, unspecified, uncomplicated: Secondary | ICD-10-CM | POA: Insufficient documentation

## 2011-12-15 HISTORY — DX: Anxiety disorder, unspecified: F41.9

## 2011-12-15 HISTORY — DX: Gastro-esophageal reflux disease without esophagitis: K21.9

## 2011-12-15 HISTORY — DX: Calculus of kidney: N20.0

## 2011-12-15 HISTORY — DX: Unspecified chronic bronchitis: J42

## 2011-12-15 HISTORY — DX: Fibromyalgia: M79.7

## 2011-12-15 HISTORY — DX: Headache: R51

## 2011-12-15 HISTORY — DX: Restless legs syndrome: G25.81

## 2011-12-15 SURGERY — COLONOSCOPY WITH PROPOFOL
Anesthesia: Monitor Anesthesia Care

## 2011-12-15 MED ORDER — FENTANYL CITRATE 0.05 MG/ML IJ SOLN
INTRAMUSCULAR | Status: DC | PRN
  Administered 2011-12-15: 100 ug via INTRAVENOUS

## 2011-12-15 MED ORDER — LACTATED RINGERS IV SOLN
INTRAVENOUS | Status: DC | PRN
  Administered 2011-12-15: 08:00:00 via INTRAVENOUS

## 2011-12-15 MED ORDER — PROPOFOL 10 MG/ML IV EMUL
INTRAVENOUS | Status: DC | PRN
  Administered 2011-12-15: 140 ug/kg/min via INTRAVENOUS

## 2011-12-15 MED ORDER — KETAMINE HCL 10 MG/ML IJ SOLN
INTRAMUSCULAR | Status: DC | PRN
  Administered 2011-12-15: 20 mg via INTRAVENOUS

## 2011-12-15 MED ORDER — LACTATED RINGERS IV SOLN
INTRAVENOUS | Status: DC
  Administered 2011-12-15: 08:00:00 via INTRAVENOUS

## 2011-12-15 MED ORDER — MIDAZOLAM HCL 5 MG/5ML IJ SOLN
INTRAMUSCULAR | Status: DC | PRN
  Administered 2011-12-15: 2 mg via INTRAVENOUS

## 2011-12-15 SURGICAL SUPPLY — 22 items

## 2011-12-15 NOTE — Op Note (Signed)
[facility] [address] [place], [place]  [address]  COLONOSCOPY PROCEDURE REPORT  PATIENT:  [name], [name]  MR#:  [id number] BIRTHDATE:  05/21/1961, 50 yrs. old  GENDER:  female ENDOSCOPIST:  Willis Modena REF. BY:  [name], M.D. Claud Kelp, M.D. PROCEDURE DATE:  12/15/2011 PROCEDURE:  Colon through ostomy ASA CLASS:  Class II INDICATIONS:  blood per ostomy MEDICATIONS:  MAC per CRNA DESCRIPTION OF PROCEDURE:   After the risks benefits and alternatives of the procedure were thoroughly explained, informed consent was obtained.  Digital rectal exam was performed and revealed no abnormalities.   The [id number] ([id number]) endoscope was introduced through the anus and advanced to the cecum, which was identified by both the appendix and ileocecal valve, without limitations.  The quality of the prep was fair.  The instrument was then slowly withdrawn as the colon was fully examined. <<PROCEDUREIMAGES>> FINDINGS:  Somewhat diminished anal sphincter tone, otherwise normal digital rectal exam.  First, evaluation transanally of the Hartman's pouch was undertaken.  Rectal mucosa, including retroflexed views, was normal.  There was lots of mucous plugging in the proximal portion of the pouch, precluding complete views of this area.  Subsequently, colonoscopy  via the colostomy was done. Prep quality was fair, especially in the proximal colon, where diminutive or subtle lesions could certainly have been missed. About 10 cm from the colostomy orifice, there was a tight angulation, which required use of an endoscope to traverse.  Once this angulation was traversed, the cecum was subsequently  reached with ease with the endoscope.  There were few residual left-sided diverticula seen.  No polyps, masses, vascular ectasias, or inflammatory changes were sen.  ENDOSCOPIC IMPRESSION:    1.  Tight angulation near colostomy insertion, requiring use of endoscope to reach  cecum. 2.  Few residual left-sided diverticula. 3.  Otherwise normal colonoscopy, with limitations from fair bowel prep quality.  RECOMMENDATIONS:      1.  Watch for potential complications of procedure. 2.  High fiber diet. 3.  OKto reverse colostomy from my perspective. 4.  Repeat colonoscopy in 5 years.  REPEAT EXAM:  5 years  ______________________________ Willis Modena  CC:  n. eSIGNEDWillis Modena at 12/15/2011 09:25 AM  Fredna Dow, [phone]

## 2011-12-15 NOTE — H&P (Signed)
Patient interval history reviewed.  Patient examined again.  There has been no change from documented H/P dated 12/14/11 and 10/04/11 (scanned into chart from our office) except as documented above.  Assessment:  Blood per colostomy.  Plan:  Colonoscopy per Hartmann's pouch and via colostomy.  Risks (bleeding, infection, bowel perforation that could require surgery, sedation-related changes in cardiopulmonary systems), benefits (identification and possible treatment of source of symptoms, exclusion of certain causes of symptoms), and alternatives (watchful waiting, radiographic imaging studies, empiric medical treatment) of colonoscopy were explained to patient in detail and she wishes to proceed.

## 2011-12-15 NOTE — Anesthesia Postprocedure Evaluation (Signed)
  Anesthesia Post-op Note  Patient: [name]  Procedure(s) Performed: Procedure(s) (LRB): COLONOSCOPY WITH PROPOFOL (N/A)  Patient Location: PACU  Anesthesia Type: MAC  Level of Consciousness: oriented and sedated  Airway and Oxygen Therapy: Patient Spontanous Breathing  Post-op Pain: none  Post-op Assessment: Post-op Vital signs reviewed, Patient's Cardiovascular Status Stable, Respiratory Function Stable and Patent Airway  Post-op Vital Signs: stable  Complications: No apparent anesthesia complications

## 2011-12-15 NOTE — Transfer of Care (Signed)
Immediate Anesthesia Transfer of Care Note  Patient: [name]  Procedure(s) Performed: Procedure(s) (LRB): COLONOSCOPY WITH PROPOFOL (N/A)  Patient Location: PACU, PICU and Endoscopy Unit  Anesthesia Type: MAC  Level of Consciousness: awake and alert   Airway & Oxygen Therapy: Patient Spontanous Breathing and Patient connected to face mask oxygen  Post-op Assessment: Report given to PACU RN and Post -op Vital signs reviewed and stable  Post vital signs: Reviewed and stable  Complications: No apparent anesthesia complications

## 2011-12-15 NOTE — Discharge Instructions (Signed)
Colonoscopy Care After Read the instructions outlined below and refer to this sheet in the next few weeks. These discharge instructions provide you with general information on caring for yourself after you leave the hospital. Your doctor may also give you specific instructions. While your treatment has been planned according to the most current medical practices available, unavoidable complications occasionally occur. If you have any problems or questions after discharge, call your doctor. HOME CARE INSTRUCTIONS ACTIVITY:  You may resume your regular activity, but move at a slower pace for the next 24 hours.   Take frequent rest periods for the next 24 hours.   Walking will help get rid of the air and reduce the bloated feeling in your belly (abdomen).   No driving for 24 hours (because of the medicine (anesthesia) used during the test).   You may shower.   Do not sign any important legal documents or operate any machinery for 24 hours (because of the anesthesia used during the test).  NUTRITION:  Drink plenty of fluids.   You may resume your normal diet as instructed by your doctor.   Begin with a light meal and progress to your normal diet. Heavy or fried foods are harder to digest and may make you feel sick to your stomach (nauseated).   Avoid alcoholic beverages for 24 hours or as instructed.  MEDICATIONS:  You may resume your normal medications unless your doctor tells you otherwise.  WHAT TO EXPECT TODAY:  Some feelings of bloating in the abdomen.   Passage of more gas than usual.   Spotting of blood in your stool or on the toilet paper.  IF YOU HAD POLYPS REMOVED DURING THE COLONOSCOPY:  No aspirin products for 7 days or as instructed.   No alcohol for 7 days or as instructed.   Eat a soft diet for the next 24 hours.  FINDING OUT THE RESULTS OF YOUR TEST Not all test results are available during your visit. If your test results are not back during the visit, make an  appointment with your caregiver to find out the results. Do not assume everything is normal if you have not heard from your caregiver or the medical facility. It is important for you to follow up on all of your test results.  SEEK IMMEDIATE MEDICAL CARE IF:  You have more than a spotting of blood in your stool.   Your belly is swollen (abdominal distention).   You are nauseated or vomiting.   You have a fever.   You have abdominal pain or discomfort that is severe or gets worse throughout the day.

## 2012-10-31 ENCOUNTER — Encounter (HOSPITAL_COMMUNITY): Payer: Self-pay | Admitting: Emergency Medicine

## 2012-10-31 ENCOUNTER — Inpatient Hospital Stay (HOSPITAL_COMMUNITY)
Admission: EM | Admit: 2012-10-31 | Discharge: 2012-11-11 | DRG: 335 | Disposition: A | Discharge status: Home / Self Care | Payer: 59 | Attending: Surgery | Admitting: Surgery

## 2012-10-31 ENCOUNTER — Emergency Department (HOSPITAL_COMMUNITY): Payer: 59

## 2012-10-31 DIAGNOSIS — R5381 Other malaise: Secondary | ICD-10-CM | POA: Diagnosis present

## 2012-10-31 DIAGNOSIS — R5383 Other fatigue: Secondary | ICD-10-CM | POA: Diagnosis present

## 2012-10-31 DIAGNOSIS — K219 Gastro-esophageal reflux disease without esophagitis: Secondary | ICD-10-CM | POA: Diagnosis present

## 2012-10-31 DIAGNOSIS — IMO0001 Reserved for inherently not codable concepts without codable children: Secondary | ICD-10-CM | POA: Diagnosis present

## 2012-10-31 DIAGNOSIS — K432 Incisional hernia without obstruction or gangrene: Secondary | ICD-10-CM | POA: Diagnosis present

## 2012-10-31 DIAGNOSIS — N179 Acute kidney failure, unspecified: Secondary | ICD-10-CM

## 2012-10-31 DIAGNOSIS — Z7982 Long term (current) use of aspirin: Secondary | ICD-10-CM

## 2012-10-31 DIAGNOSIS — J45909 Unspecified asthma, uncomplicated: Secondary | ICD-10-CM | POA: Diagnosis present

## 2012-10-31 DIAGNOSIS — G2581 Restless legs syndrome: Secondary | ICD-10-CM

## 2012-10-31 DIAGNOSIS — J9601 Acute respiratory failure with hypoxia: Secondary | ICD-10-CM

## 2012-10-31 DIAGNOSIS — F17200 Nicotine dependence, unspecified, uncomplicated: Secondary | ICD-10-CM | POA: Diagnosis present

## 2012-10-31 DIAGNOSIS — F411 Generalized anxiety disorder: Secondary | ICD-10-CM

## 2012-10-31 DIAGNOSIS — J189 Pneumonia, unspecified organism: Secondary | ICD-10-CM

## 2012-10-31 DIAGNOSIS — Z79899 Other long term (current) drug therapy: Secondary | ICD-10-CM

## 2012-10-31 DIAGNOSIS — R739 Hyperglycemia, unspecified: Secondary | ICD-10-CM

## 2012-10-31 DIAGNOSIS — D72829 Elevated white blood cell count, unspecified: Secondary | ICD-10-CM

## 2012-10-31 DIAGNOSIS — J9600 Acute respiratory failure, unspecified whether with hypoxia or hypercapnia: Secondary | ICD-10-CM | POA: Diagnosis present

## 2012-10-31 DIAGNOSIS — L98499 Non-pressure chronic ulcer of skin of other sites with unspecified severity: Secondary | ICD-10-CM | POA: Diagnosis present

## 2012-10-31 DIAGNOSIS — IMO0002 Reserved for concepts with insufficient information to code with codable children: Secondary | ICD-10-CM

## 2012-10-31 DIAGNOSIS — J9819 Other pulmonary collapse: Secondary | ICD-10-CM | POA: Diagnosis present

## 2012-10-31 DIAGNOSIS — J9811 Atelectasis: Secondary | ICD-10-CM

## 2012-10-31 DIAGNOSIS — F19939 Other psychoactive substance use, unspecified with withdrawal, unspecified: Secondary | ICD-10-CM | POA: Diagnosis present

## 2012-10-31 DIAGNOSIS — E876 Hypokalemia: Secondary | ICD-10-CM | POA: Diagnosis not present

## 2012-10-31 DIAGNOSIS — J441 Chronic obstructive pulmonary disease with (acute) exacerbation: Secondary | ICD-10-CM

## 2012-10-31 DIAGNOSIS — Y95 Nosocomial condition: Secondary | ICD-10-CM

## 2012-10-31 DIAGNOSIS — F19239 Other psychoactive substance dependence with withdrawal, unspecified: Secondary | ICD-10-CM | POA: Diagnosis present

## 2012-10-31 DIAGNOSIS — Z6838 Body mass index (BMI) 38.0-38.9, adult: Secondary | ICD-10-CM

## 2012-10-31 DIAGNOSIS — R109 Unspecified abdominal pain: Secondary | ICD-10-CM

## 2012-10-31 DIAGNOSIS — M797 Fibromyalgia: Secondary | ICD-10-CM

## 2012-10-31 DIAGNOSIS — K660 Peritoneal adhesions (postprocedural) (postinfection): Secondary | ICD-10-CM | POA: Diagnosis present

## 2012-10-31 DIAGNOSIS — K435 Parastomal hernia without obstruction or  gangrene: Secondary | ICD-10-CM

## 2012-10-31 DIAGNOSIS — R7309 Other abnormal glucose: Secondary | ICD-10-CM | POA: Diagnosis present

## 2012-10-31 DIAGNOSIS — E669 Obesity, unspecified: Secondary | ICD-10-CM

## 2012-10-31 DIAGNOSIS — T17408A Unspecified foreign body in trachea causing other injury, initial encounter: Secondary | ICD-10-CM | POA: Diagnosis present

## 2012-10-31 DIAGNOSIS — J45901 Unspecified asthma with (acute) exacerbation: Secondary | ICD-10-CM | POA: Diagnosis present

## 2012-10-31 DIAGNOSIS — F17213 Nicotine dependence, cigarettes, with withdrawal: Secondary | ICD-10-CM

## 2012-10-31 DIAGNOSIS — J810 Acute pulmonary edema: Secondary | ICD-10-CM

## 2012-10-31 DIAGNOSIS — K56609 Unspecified intestinal obstruction, unspecified as to partial versus complete obstruction: Secondary | ICD-10-CM

## 2012-10-31 LAB — URINALYSIS, MICROSCOPIC ONLY
Bilirubin Urine: NEGATIVE
Glucose, UA: NEGATIVE mg/dL
Hgb urine dipstick: NEGATIVE
Ketones, ur: NEGATIVE mg/dL
Nitrite: NEGATIVE
Protein, ur: NEGATIVE mg/dL
Specific Gravity, Urine: 1.018 (ref 1.005–1.030)
Urobilinogen, UA: 0.2 mg/dL (ref 0.0–1.0)
pH: 5 (ref 5.0–8.0)

## 2012-10-31 LAB — CBC WITH DIFFERENTIAL/PLATELET
Basophils Absolute: 0 10*3/uL (ref 0.0–0.1)
Basophils Relative: 0 % (ref 0–1)
Eosinophils Absolute: 0.1 10*3/uL (ref 0.0–0.7)
Eosinophils Relative: 1 % (ref 0–5)
HCT: 46.3 % — ABNORMAL HIGH (ref 36.0–46.0)
Hemoglobin: 15.7 g/dL — ABNORMAL HIGH (ref 12.0–15.0)
Lymphocytes Relative: 26 % (ref 12–46)
Lymphs Abs: 2.7 10*3/uL (ref 0.7–4.0)
MCH: 30.3 pg (ref 26.0–34.0)
MCHC: 33.9 g/dL (ref 30.0–36.0)
MCV: 89.2 fL (ref 78.0–100.0)
Monocytes Absolute: 0.9 10*3/uL (ref 0.1–1.0)
Monocytes Relative: 9 % (ref 3–12)
Neutro Abs: 6.6 10*3/uL (ref 1.7–7.7)
Neutrophils Relative %: 64 % (ref 43–77)
Platelets: 266 10*3/uL (ref 150–400)
RBC: 5.19 MIL/uL — ABNORMAL HIGH (ref 3.87–5.11)
RDW: 13.1 % (ref 11.5–15.5)
WBC: 10.3 10*3/uL (ref 4.0–10.5)

## 2012-10-31 LAB — COMPREHENSIVE METABOLIC PANEL
ALT: <5 U/L (ref 0–35)
AST: 12 U/L (ref 0–37)
Albumin: 3.6 g/dL (ref 3.5–5.2)
Alkaline Phosphatase: 90 U/L (ref 39–117)
BUN: 20 mg/dL (ref 6–23)
CO2: 23 mEq/L (ref 19–32)
Calcium: 9 mg/dL (ref 8.4–10.5)
Chloride: 102 mEq/L (ref 96–112)
Creatinine, Ser: 0.71 mg/dL (ref 0.50–1.10)
GFR calc Af Amer: >90 mL/min (ref >90)
GFR calc non Af Amer: >90 mL/min (ref >90)
Glucose, Bld: 128 mg/dL — ABNORMAL HIGH (ref 70–99)
Potassium: 3.5 mEq/L (ref 3.5–5.1)
Sodium: 137 mEq/L (ref 135–145)
Total Bilirubin: 0.2 mg/dL — ABNORMAL LOW (ref 0.3–1.2)
Total Protein: 7.3 g/dL (ref 6.0–8.3)

## 2012-10-31 LAB — PROTIME-INR
INR: 1.01 (ref 0.00–1.49)
Prothrombin Time: 13.2 seconds (ref 11.6–15.2)

## 2012-10-31 LAB — LIPASE, BLOOD: Lipase: 38 U/L (ref 11–59)

## 2012-10-31 LAB — APTT: aPTT: 33 seconds (ref 24–37)

## 2012-10-31 MED ORDER — HYDROMORPHONE HCL PF 1 MG/ML IJ SOLN
1.0000 mg | Freq: Once | INTRAMUSCULAR | Status: AC
  Administered 2012-10-31: 1 mg via INTRAVENOUS
  Filled 2012-10-31: qty 1

## 2012-10-31 MED ORDER — ONDANSETRON HCL 4 MG/2ML IJ SOLN
4.0000 mg | Freq: Once | INTRAMUSCULAR | Status: AC
  Administered 2012-10-31: 4 mg via INTRAVENOUS
  Filled 2012-10-31: qty 2

## 2012-10-31 MED ORDER — SODIUM CHLORIDE 0.9 % IV SOLN
INTRAVENOUS | Status: DC
  Administered 2012-10-31 – 2012-11-02 (×4): via INTRAVENOUS

## 2012-10-31 MED ORDER — IOHEXOL 300 MG/ML  SOLN
50.0000 mL | Freq: Once | INTRAMUSCULAR | Status: AC | PRN
  Administered 2012-10-31: 50 mL via ORAL

## 2012-10-31 MED ORDER — MORPHINE SULFATE 2 MG/ML IJ SOLN
1.0000 mg | INTRAMUSCULAR | Status: DC | PRN
  Administered 2012-10-31 – 2012-11-01 (×3): 4 mg via INTRAVENOUS
  Filled 2012-10-31 (×3): qty 2

## 2012-10-31 MED ORDER — ALVIMOPAN 12 MG PO CAPS
12.0000 mg | ORAL_CAPSULE | Freq: Once | ORAL | Status: AC
  Administered 2012-11-01: 12 mg via ORAL
  Filled 2012-10-31: qty 1

## 2012-10-31 MED ORDER — INFLUENZA VIRUS VACC SPLIT PF IM SUSP
0.5000 mL | INTRAMUSCULAR | Status: AC
  Filled 2012-10-31 (×2): qty 0.5

## 2012-10-31 MED ORDER — IOHEXOL 300 MG/ML  SOLN
100.0000 mL | Freq: Once | INTRAMUSCULAR | Status: AC | PRN
  Administered 2012-10-31: 100 mL via INTRAVENOUS

## 2012-10-31 MED ORDER — ONDANSETRON HCL 4 MG/2ML IJ SOLN
4.0000 mg | Freq: Four times a day (QID) | INTRAMUSCULAR | Status: DC | PRN
  Administered 2012-10-31 – 2012-11-01 (×2): 4 mg via INTRAVENOUS
  Filled 2012-10-31 (×2): qty 2

## 2012-10-31 MED ORDER — DEXTROSE 5 % IV SOLN: 2.0000 g | INTRAVENOUS | Status: DC

## 2012-10-31 MED ORDER — ACETAMINOPHEN 650 MG RE SUPP: 650.0000 mg | Freq: Four times a day (QID) | RECTAL | Status: DC | PRN

## 2012-10-31 MED ORDER — DIPHENHYDRAMINE HCL 50 MG/ML IJ SOLN: 12.5000 mg | Freq: Four times a day (QID) | INTRAMUSCULAR | Status: DC | PRN

## 2012-10-31 MED ORDER — ACETAMINOPHEN 325 MG PO TABS: 650.0000 mg | ORAL_TABLET | Freq: Four times a day (QID) | ORAL | Status: DC | PRN

## 2012-10-31 MED ORDER — DIPHENHYDRAMINE HCL 12.5 MG/5ML PO ELIX: 12.5000 mg | ORAL_SOLUTION | Freq: Four times a day (QID) | ORAL | Status: DC | PRN

## 2012-10-31 MED ORDER — HEPARIN SODIUM (PORCINE) 5000 UNIT/ML IJ SOLN
5000.0000 [IU] | Freq: Three times a day (TID) | INTRAMUSCULAR | Status: DC
  Administered 2012-10-31 – 2012-11-01 (×2): 5000 [IU] via SUBCUTANEOUS
  Filled 2012-10-31 (×5): qty 1

## 2012-10-31 MED ORDER — NICOTINE 14 MG/24HR TD PT24
14.0000 mg | MEDICATED_PATCH | Freq: Every day | TRANSDERMAL | Status: DC
  Administered 2012-10-31: 14 mg via TRANSDERMAL
  Filled 2012-10-31 (×2): qty 1

## 2012-10-31 MED ORDER — KCL IN DEXTROSE-NACL 40-5-0.45 MEQ/L-%-% IV SOLN
INTRAVENOUS | Status: DC
  Administered 2012-10-31 – 2012-11-01 (×2): via INTRAVENOUS
  Filled 2012-10-31 (×4): qty 1000

## 2012-10-31 MED ORDER — PANTOPRAZOLE SODIUM 40 MG IV SOLR
40.0000 mg | Freq: Every day | INTRAVENOUS | Status: DC
  Administered 2012-10-31: 40 mg via INTRAVENOUS
  Filled 2012-10-31 (×2): qty 40

## 2012-10-31 NOTE — ED Notes (Signed)
Patient's husband requesting to get to waiting room for vending machine.  Patient's husband came back in room and closed the door and curtain.  Patient and husband informed that the curtain in the window must stay open.

## 2012-10-31 NOTE — Progress Notes (Signed)
[facility] ED CM noted CM consult Spoke with pt about Cm Consult Pt informed CM she did not need home health and her mother would be available to take care of her and her wound at home if needed.  Services refused.  Pt states this "surgery should not be like my last one where I was left open" Pt states [facility] assisted after previous surgery. Cm provided pt with list of [place] home health agencies for choice if admitting MD orders services prior to d/c Pt asked CM about who to call for a "complaint"  CM provided [facility] main number. CM spoke with ED patient advocate about pt request Cm updated pt that ED patient advocate would speak with her further.  CM spoke with ED patient advocate and TCU RN.

## 2012-10-31 NOTE — ED Notes (Signed)
Patient transported to CT 

## 2012-10-31 NOTE — ED Notes (Signed)
Pt c/o abd pain, N/V from hernia and diverticulitis. Pt has a colostomy in LLQ.pt belives she is having a flare up.

## 2012-10-31 NOTE — ED Provider Notes (Signed)
History     CSN: [id number]  Arrival date & time 10/31/12  0446   First MD Initiated Contact with Patient 10/31/12 [phone]      Chief Complaint  Patient presents with  . Abdominal Pain    (Consider location/radiation/quality/duration/timing/severity/associated sxs/prior treatment) HPI Hx per PT. LLQ ABD pain and N/V with h/o hernia and colostomy. She passed blood a few days ago but not since. Pain is sharp and not radiating, mod to severe. She has had many CT scans in the past and after a discussion regarding the same, she very much wants to have another CT scan tonight. No F/C. Pain is constant without known alleviating factors.  Past Medical History  Diagnosis Date  . Diverticulitis   . Fibromyalgia   . Restless leg syndrome     Bilateral  . Asthma   . Bronchitis, chronic     Effects worse with URI  . Kidney stones     at least 6 stones in past  . Headache     migraine headache  . Acid reflux disease   . Anxiety     Past Surgical History  Procedure Laterality Date  . Colostomy    . Appendectomy    . Cholecystectomy    . Partial hysterectomy      partial    No family history on file.  History  Substance Use Topics  . Smoking status: Current Every Day Smoker -- 1.00 packs/day  . Smokeless tobacco: Not on file  . Alcohol Use: No    OB History   Grav Para Term Preterm Abortions TAB SAB Ect Mult Living                  Review of Systems  Constitutional: Negative for fever and chills.  HENT: Negative for neck pain and neck stiffness.   Eyes: Negative for pain.  Respiratory: Negative for shortness of breath.   Cardiovascular: Negative for chest pain.  Gastrointestinal: Positive for nausea, vomiting and abdominal pain.  Genitourinary: Negative for dysuria.  Musculoskeletal: Negative for back pain.  Skin: Negative for rash.  Neurological: Negative for headaches.  All other systems reviewed and are negative.    Allergies  Review of patient's allergies  indicates no known allergies.  Home Medications   Current Outpatient Rx  Name  Route  Sig  Dispense  Refill  . amitriptyline (ELAVIL) 25 MG tablet   Oral   Take 25 mg by mouth at bedtime.           . Aspirin-Salicylamide-Caffeine (BC HEADACHE POWDER PO)   Oral   Take 1 packet by mouth daily as needed. Back pain         . diazepam (VALIUM) 10 MG tablet   Oral   Take 10 mg by mouth every 8 (eight) hours as needed for anxiety.          Marland Kitchen omeprazole (PRILOSEC) 20 MG capsule   Oral   Take 20 mg by mouth daily.           . traMADol (ULTRAM) 50 MG tablet   Oral   Take 50 mg by mouth every 8 (eight) hours as needed. Maximum dose= 8 tablets per day. pain            BP 159/93  Pulse 94  Temp(Src) 98 F (36.7 C) (Oral)  Resp 18  SpO2 100%  LMP 09/21/2011  Physical Exam  Constitutional: She is oriented to person, place, and time. She appears well-developed and  well-nourished.  HENT:  Head: Normocephalic and atraumatic.  Eyes: EOM are normal. Pupils are equal, round, and reactive to light. No scleral icterus.  Neck: Neck supple.  Cardiovascular: Normal rate, regular rhythm and intact distal pulses.   Pulmonary/Chest: Effort normal and breath sounds normal. No respiratory distress.  Abdominal: Soft. Bowel sounds are normal. She exhibits no distension. There is no rebound and no guarding.  LLQ tenderness in region of ostomy which appears well without erythema/ warmth or skin breakdown - there is an area adjacent to this which is nontender with open area of granulation tissue that PT describes as chronic.   Musculoskeletal: Normal range of motion. She exhibits no edema.  Neurological: She is alert and oriented to person, place, and time.  Skin: Skin is warm and dry.    ED Course  Procedures (including critical care time)  Results for orders placed during the hospital encounter of 10/31/12  CBC WITH DIFFERENTIAL      Result Value Range   WBC 10.3  4.0 - 10.5 K/uL    RBC 5.19 (*) 3.87 - 5.11 MIL/uL   Hemoglobin 15.7 (*) 12.0 - 15.0 g/dL   HCT 16.1 (*) 09.6 - 04.5 %   MCV 89.2  78.0 - 100.0 fL   MCH 30.3  26.0 - 34.0 pg   MCHC 33.9  30.0 - 36.0 g/dL   RDW 40.9  81.1 - 91.4 %   Platelets 266  150 - 400 K/uL   Neutrophils Relative 64  43 - 77 %   Neutro Abs 6.6  1.7 - 7.7 K/uL   Lymphocytes Relative 26  12 - 46 %   Lymphs Abs 2.7  0.7 - 4.0 K/uL   Monocytes Relative 9  3 - 12 %   Monocytes Absolute 0.9  0.1 - 1.0 K/uL   Eosinophils Relative 1  0 - 5 %   Eosinophils Absolute 0.1  0.0 - 0.7 K/uL   Basophils Relative 0  0 - 1 %   Basophils Absolute 0.0  0.0 - 0.1 K/uL  COMPREHENSIVE METABOLIC PANEL      Result Value Range   Sodium 137  135 - 145 mEq/L   Potassium 3.5  3.5 - 5.1 mEq/L   Chloride 102  96 - 112 mEq/L   CO2 23  19 - 32 mEq/L   Glucose, Bld 128 (*) 70 - 99 mg/dL   BUN 20  6 - 23 mg/dL   Creatinine, Ser 7.82  0.50 - 1.10 mg/dL   Calcium 9.0  8.4 - 95.6 mg/dL   Total Protein 7.3  6.0 - 8.3 g/dL   Albumin 3.6  3.5 - 5.2 g/dL   AST 12  0 - 37 U/L   ALT <5  0 - 35 U/L   Alkaline Phosphatase 90  39 - 117 U/L   Total Bilirubin 0.2 (*) 0.3 - 1.2 mg/dL   GFR calc non Af Amer >90  >90 mL/min   GFR calc Af Amer >90  >90 mL/min  LIPASE, BLOOD      Result Value Range   Lipase 38  11 - 59 U/L    IV Dilaudid. IV fluids   MDM  ABD pain evaluated with labs and Ct scan, pending at 7am  treated IVfs and IV narcotics        Sunnie Nielsen, MD 10/31/12 [phone]

## 2012-10-31 NOTE — ED Notes (Addendum)
Patient requesting to eat.  Patient's husband yelling at nurses station, states, "My wife has been here since 4 o'clock this morning.  She needs to eat.  Ya'll aren't telling us nothing."  This nurse informed patient that she is NPO and is receiving IVF for hydration.  This nurse informed patient that we were waiting on a room to transfer her to and that the doctor would be by to discuss when her surgery would take place.  On the way out of the room, the patient states, "Don't talk to her.  She's a smart ass."  This nurse informed the patient that her language would not be tolerated and she could leave if she felt like she needed to resort to name calling."

## 2012-10-31 NOTE — H&P (Signed)
Pt seen and examined.  PSBO  from large chronic parastomal hernia.  Given obstruction,  Not candidate for colostomy closure.  Abdomen is non acute but she will require ex lap in am for reapair or move colostomy.  WOCN has marked  Pt.  Risk of bleeding,  Infection,  Open wound,  DVT,  Bowel injury.  She agrees to proceed.

## 2012-10-31 NOTE — H&P (Signed)
[name] is an 52 y.o. female.   Chief Complaint: Abdominal pain nausea and vomiting.  Nonhealing skin ulcer left lower quadrant Primary care: Dr. Buren Kos HPI: Patient is a 52 year old female with a history of diverticulitis. She was 06/22/09 by Dr. Sherryll Burger with diverticulitis and underwent drainage by IR.  This was followed later with a left colectomy and colostomy take down the splenic flexure on 07/04/09 by Dr. Claud Kelp. Since that time she has done well. She never returned for a reversal of her colostomy, for financial reasons. Following her E. 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 It  has progressed on CT scan 11/30/10 and again on 08/23/11. She never had an obstruction from this.    She presents today with complaints of worsening abdominal pain. She notes this started approximately 2 weeks ago. It is in the midportion of her abdomen and extends to both the left and the right lower quadrants. It's fairly nonspecific. She's had episodes of nausea for 2 weeks, and some intermittent episodes of vomiting, approximately 4 times over the last 2 weeks. She complains of ongoing problems with eating and then being bloated for the last 4-5 months.  She reports taking double doses of Metamucil and MiraLAX. She will have small bowel movements before the MiraLAX, and then large volume movements which are quite loose in character. This occurs every 3-4 days. Her last BM was last night . She also reports a recurring skin ulcer to the left of her ostomy. Is usually smaller than the current ulcer. This is also very painful area and a part of her current symptoms. It is approximately 4 cm x 5 cm oblong in shape and is just left of the ostomy. It is not in the area of the ostomy appliance. This is been present before but much smaller and she says Dr. Clelia Croft believes it is secondary to ischemia. Because her ongoing pain, nausea and intermittent vomiting she felt she was  obstructed, and presented to the ER for evaluation. She apparently has not returned to see Dr. Derrell Lolling who did her original colostomy secondary to financial issues.  Past Medical History  Diagnosis Date  . Diverticulitis 2010 with percutaneous drain and followed by colostomy/left colectomy.   . Fibromyalgia Chronic pain mostly in legs and back   . Restless leg syndrome/insomnia-  Denies sleep apnea     Bilateral  . Asthma   . Bronchitis, chronic     Effects worse with URI  None for 5 years  . Kidney stones     at least 6 stones in past  . Headache     migraine headache  . Acid reflux disease/ stable on PPI   . Anxiety     Past Surgical History  Procedure Laterality Date  . Colostomy Colonoscopy showed tight angulation near colostomy insertion, few diverticula, nothing else was abnormal  12/15/11 Dr. Dulce Sellar  . Appendectomy    . Cholecystectomy    . Partial hysterectomy      partial    No family history on file. Social History:  reports that she has been smoking.  She does not have any smokeless tobacco history on file. She reports that she does not drink alcohol or use illicit drugs.  Allergies: No Known Allergies Prior to Admission medications   Medication Sig Start Date End Date Taking? Authorizing Provider  amitriptyline (ELAVIL) 25 MG tablet Take 25 mg by mouth at bedtime.     Yes  Historical Provider, MD  Aspirin-Salicylamide-Caffeine (BC HEADACHE POWDER PO) Take 1 packet by mouth daily as needed. Back pain   Yes Historical Provider, MD  diazepam (VALIUM) 10 MG tablet Take 10 mg by mouth every 8 (eight) hours as needed for anxiety.    Yes Historical Provider, MD  omeprazole (PRILOSEC) 20 MG capsule Take 20 mg by mouth daily.     Yes Historical Provider, MD  traMADol (ULTRAM) 50 MG tablet Take 50 mg by mouth every 8 (eight) hours as needed. Maximum dose= 8 tablets per day. pain    Yes Historical Provider, MD     (Not in a hospital admission)  Results for orders placed  during the hospital encounter of 10/31/12 (from the past 48 hour(s))  CBC WITH DIFFERENTIAL     Status: Abnormal   Collection Time    10/31/12  5:45 AM      Result Value Range   WBC 10.3  4.0 - 10.5 K/uL   RBC 5.19 (*) 3.87 - 5.11 MIL/uL   Hemoglobin 15.7 (*) 12.0 - 15.0 g/dL   HCT 16.1 (*) 09.6 - 04.5 %   MCV 89.2  78.0 - 100.0 fL   MCH 30.3  26.0 - 34.0 pg   MCHC 33.9  30.0 - 36.0 g/dL   RDW 40.9  81.1 - 91.4 %   Platelets 266  150 - 400 K/uL   Neutrophils Relative 64  43 - 77 %   Neutro Abs 6.6  1.7 - 7.7 K/uL   Lymphocytes Relative 26  12 - 46 %   Lymphs Abs 2.7  0.7 - 4.0 K/uL   Monocytes Relative 9  3 - 12 %   Monocytes Absolute 0.9  0.1 - 1.0 K/uL   Eosinophils Relative 1  0 - 5 %   Eosinophils Absolute 0.1  0.0 - 0.7 K/uL   Basophils Relative 0  0 - 1 %   Basophils Absolute 0.0  0.0 - 0.1 K/uL  COMPREHENSIVE METABOLIC PANEL     Status: Abnormal   Collection Time    10/31/12  5:45 AM      Result Value Range   Sodium 137  135 - 145 mEq/L   Potassium 3.5  3.5 - 5.1 mEq/L   Chloride 102  96 - 112 mEq/L   CO2 23  19 - 32 mEq/L   Glucose, Bld 128 (*) 70 - 99 mg/dL   BUN 20  6 - 23 mg/dL   Creatinine, Ser 7.82  0.50 - 1.10 mg/dL   Calcium 9.0  8.4 - 95.6 mg/dL   Total Protein 7.3  6.0 - 8.3 g/dL   Albumin 3.6  3.5 - 5.2 g/dL   AST 12  0 - 37 U/L   ALT <5  0 - 35 U/L   Comment: REPEATED TO VERIFY   Alkaline Phosphatase 90  39 - 117 U/L   Total Bilirubin 0.2 (*) 0.3 - 1.2 mg/dL   GFR calc non Af Amer >90  >90 mL/min   GFR calc Af Amer >90  >90 mL/min   Comment:            The eGFR has been calculated     using the CKD EPI equation.     This calculation has not been     validated in all clinical     situations.     eGFR's persistently     <90 mL/min signify     possible Chronic Kidney Disease.  LIPASE, BLOOD  Status: None   Collection Time    10/31/12  5:45 AM      Result Value Range   Lipase 38  11 - 59 U/L  URINALYSIS, MICROSCOPIC ONLY     Status:  Abnormal   Collection Time    10/31/12  7:20 AM      Result Value Range   Color, Urine YELLOW  YELLOW   APPearance CLOUDY (*) CLEAR   Specific Gravity, Urine 1.018  1.005 - 1.030   pH 5.0  5.0 - 8.0   Glucose, UA NEGATIVE  NEGATIVE mg/dL   Hgb urine dipstick NEGATIVE  NEGATIVE   Bilirubin Urine NEGATIVE  NEGATIVE   Ketones, ur NEGATIVE  NEGATIVE mg/dL   Protein, ur NEGATIVE  NEGATIVE mg/dL   Urobilinogen, UA 0.2  0.0 - 1.0 mg/dL   Nitrite NEGATIVE  NEGATIVE   Leukocytes, UA SMALL (*) NEGATIVE   WBC, UA 3-6  <3 WBC/hpf   RBC / HPF 0-2  <3 RBC/hpf   Bacteria, UA FEW (*) RARE   Squamous Epithelial / LPF RARE  RARE   Crystals CA OXALATE CRYSTALS (*) NEGATIVE   Ct Abdomen Pelvis W Contrast  10/31/2012  *RADIOLOGY REPORT*  Clinical Data: Left lower quadrant pain  CT ABDOMEN AND PELVIS WITH CONTRAST  Technique:  Multidetector CT imaging of the abdomen and pelvis was performed following the standard protocol during bolus administration of intravenous contrast.  Contrast: OMNIPAQUE IOHEXOL 300 MG/ML  SOLN  Comparison: 08/24/2011  Findings: No pleural or pericardial effusion.  Scattered small pulmonary nodules are identified within the lung bases. These appears similar to the previous exam.  Similar appearance of the low attenuation structure within the right hepatic lobe measuring 5 mm, image 25.  Previous cholecystectomy.  Mild prominence of the common bile duct which measures up to 6 mm, image 31.   Normal appearance of the pancreas. The spleen is normal.  Bilateral adrenal glands are unremarkable.  The kidneys are both unremarkable. The urinary bladder is normal.  Uterus containing two fibroids identified, similar to previous exam.  There is no free fluid or fluid collections.  Calcified atherosclerotic change affects the abdominal aorta and its branches.  No aneurysm. There is no adenopathy within the upper abdomen.  No pelvic or inguinal adenopathy.  The stomach appears normal.  Abnormal  dilatation of the proximal and mid small bowel loops identified.  The patient  has a left lower quadrant colostomy.  There is a large parastomal hernia containing obstructed loops of small bowel.  Transition point is identified, on image 52/series 2.  The colon appears normal in caliber.  No evidence for bowel perforation or abscess formation. No pneumatosis or free intraperitoneal air noted.  No complications associated with the rectal pouch.  Review of the visualized osseous structures is unremarkable.  IMPRESSION:  1. Small bowel obstruction.  There is a left lower quadrant colostomy with peristomal hernia containing obstructed loops of small bowel. 2.  Similar appearance of innumerable small pulmonary nodules within the lung bases. 3.  Multiple small pulmonary nodules within the lung bases are stable from previous exam.   Original Report Authenticated By: Signa Kell, M.D.     Review of Systems  Constitutional: Negative for fever, chills, weight loss, malaise/fatigue and diaphoresis.  HENT: Negative.   Eyes: Negative.   Respiratory: Negative for cough, hemoptysis, sputum production, shortness of breath and wheezing.   Cardiovascular: Positive for leg swelling (she stands for 8-9 hours per day.).  Gastrointestinal: Positive for  heartburn (OK on PPI), vomiting (4 episodes of vomiting over last 4 weeks with intermitent nausea), abdominal pain (2 areas of pain, first is mid abdomen, and hurts on and off.   2nd is the LLQ, which has had skin breakdown that has enlarged over the last 2 weeks), diarrhea (she has days where she has a small amount of stool, then she will have an episode of large amout of discharge that is very loose.) and blood in stool (she thinks she may have had some last week with an episode of diarrhea). Negative for nausea (on and off for 2 weeks), constipation and melena.  Genitourinary: Negative.   Musculoskeletal: Positive for back pain and joint pain.       Chronic pain legs and  back with being up and walking.   Skin:       She has an area that ulcerates every so often, and it has reoccurred and is larger than it has ever been before.  Neurological: Negative.  Negative for weakness.  Endo/Heme/Allergies: Negative.   Psychiatric/Behavioral: The patient is nervous/anxious (says this is worse with pain last couple weeks.).     Blood pressure 112/91, pulse 64, temperature 98 F (36.7 C), temperature source Oral, resp. rate 18, last menstrual period 09/21/2011, SpO2 97.00%. Physical Exam  Constitutional: She is oriented to person, place, and time. She appears well-developed and well-nourished.  Obese, BMI is about 38, she's not sure of weight.   HENT:  Head: Normocephalic and atraumatic.  Nose: Nose normal.  Eyes: Conjunctivae and EOM are normal. Pupils are equal, round, and reactive to light. Right eye exhibits no discharge. Left eye exhibits no discharge. No scleral icterus.  Neck: Normal range of motion. Neck supple. No JVD present. No tracheal deviation present. No thyromegaly present.  Cardiovascular: Normal rate, regular rhythm, normal heart sounds and intact distal pulses.  Exam reveals no gallop.   No murmur heard. Respiratory: Effort normal and breath sounds normal. No respiratory distress. She has no wheezes. She has no rales. She exhibits no tenderness.  GI: Soft. She exhibits no distension. There is tenderness. There is guarding. There is no rebound.  Bowel sounds are hypoactive She has an ostomy that I can place finger in, currently, no stool. She has bag off ostomy currently. She has an ulcerated looking round area LLQ to the left of the ostomy oblong that is 5 x 4 cm, with some drainage on her no stick dressing.  Musculoskeletal: She exhibits no edema.  Lymphadenopathy:    She has no cervical adenopathy.  Neurological: She is alert and oriented to person, place, and time. No cranial nerve deficit.  Skin: Skin is warm and dry. No rash noted. No  erythema. No pallor.  Psychiatric: She has a normal mood and affect. Her behavior is normal. Judgment and thought content normal.     Assessment/Plan 1. 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 Claud Kelp. 2. 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 3. Nonhealing skin ulcer left lower quadrant. 4. Fibromyalgia 5. Anxiety 6. Restless leg syndrome 7. Ongoing tobacco 8. History of nephrolithiasis 9. BMI of approximately 38.  Plan: By CT is clear that there is a parastomal hernia with loops of small bowel with in the hernia. We know this is ongoing issue, but the  patient's symptoms currently appear to be more acute. Currently she is not having any significant pain or nausea. We'll plan  to admit her and consider elective repair of her ostomy and parastomal hernia tomorrow.  [facility] physician assistant for Dr. Marca Ancona.  Harlo Jaso 10/31/2012, 12:22 PM

## 2012-10-31 NOTE — Consult Note (Addendum)
WOC ostomy consult  Stoma type/location: LLQ Colostomy-patient is partially obstructed and is seen today with Zola Button, PA for preoperative stoma site selection and wound care input Stomal assessment/size: 1 and 3/4 inch round, flat stoma with no function.  Pale, tacky. Peristomal assessment: intact.  Large parastomal hernia laterally with surface wound measuring 4 x 3 x .02cm Treatment options for stomal/peristomal skin: we will implement a twice daily saline dressing to the hernia wound and a petrolatum gauze over the LLQ colostomy until and unless function returns.  If not function, I suspect surgery is imminent. Output None Ostomy pouching: Patient wears a two-piece system at home, but I will implement a 1-piece here for flexibility if function returns.  Stoma site selected on right side should relocation be required.  Site selected is 8.5cm to the right of the umbilicus and 1cm below-essentially in line with her existing stoma. Skin marking pen used and site marking covered with a transparent thin film dressing. I will not follow.  Please re-consult if needed. Thanks, Ladona Mow, MSN, RN, [facility], CWOCN [phone])

## 2012-10-31 NOTE — ED Notes (Signed)
Per pt IV need to be removed, "significant amount of pain".
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
## 2012-10-31 NOTE — ED Notes (Signed)
Ophelia Shoulder, Director at bedside to discuss complaint

## 2012-11-01 ENCOUNTER — Inpatient Hospital Stay (HOSPITAL_COMMUNITY): Payer: 59

## 2012-11-01 ENCOUNTER — Inpatient Hospital Stay (HOSPITAL_COMMUNITY): Payer: 59 | Admitting: Anesthesiology

## 2012-11-01 ENCOUNTER — Encounter (HOSPITAL_COMMUNITY): Payer: Self-pay | Admitting: Anesthesiology

## 2012-11-01 ENCOUNTER — Encounter (HOSPITAL_COMMUNITY): Admission: EM | Disposition: A | Discharge status: Home / Self Care | Payer: Self-pay | Source: Home / Self Care

## 2012-11-01 DIAGNOSIS — K432 Incisional hernia without obstruction or gangrene: Secondary | ICD-10-CM

## 2012-11-01 DIAGNOSIS — K660 Peritoneal adhesions (postprocedural) (postinfection): Secondary | ICD-10-CM

## 2012-11-01 HISTORY — PX: LAPAROTOMY: SHX154

## 2012-11-01 HISTORY — PX: LYSIS OF ADHESION: SHX5961

## 2012-11-01 HISTORY — PX: APPLICATION OF WOUND VAC: SHX5189

## 2012-11-01 HISTORY — PX: INCISIONAL HERNIA REPAIR: SHX193

## 2012-11-01 HISTORY — PX: COLOSTOMY CLOSURE: SHX1381

## 2012-11-01 HISTORY — PX: PARASTOMAL HERNIA REPAIR: SHX2162

## 2012-11-01 LAB — CBC
HCT: 41.7 % (ref 36.0–46.0)
Hemoglobin: 13.8 g/dL (ref 12.0–15.0)
MCH: 29.9 pg (ref 26.0–34.0)
MCHC: 33.1 g/dL (ref 30.0–36.0)
MCV: 90.5 fL (ref 78.0–100.0)
Platelets: 235 10*3/uL (ref 150–400)
RBC: 4.61 MIL/uL (ref 3.87–5.11)
RDW: 13.1 % (ref 11.5–15.5)
WBC: 5.8 10*3/uL (ref 4.0–10.5)

## 2012-11-01 LAB — BASIC METABOLIC PANEL
BUN: 9 mg/dL (ref 6–23)
CO2: 25 mEq/L (ref 19–32)
Calcium: 8.5 mg/dL (ref 8.4–10.5)
Chloride: 108 mEq/L (ref 96–112)
Creatinine, Ser: 0.71 mg/dL (ref 0.50–1.10)
GFR calc Af Amer: >90 mL/min (ref >90)
GFR calc non Af Amer: >90 mL/min (ref >90)
Glucose, Bld: 127 mg/dL — ABNORMAL HIGH (ref 70–99)
Potassium: 4.1 mEq/L (ref 3.5–5.1)
Sodium: 138 mEq/L (ref 135–145)

## 2012-11-01 LAB — URINE CULTURE
Colony Count: NO GROWTH
Culture: NO GROWTH

## 2012-11-01 LAB — SURGICAL PCR SCREEN
MRSA, PCR: NEGATIVE
Staphylococcus aureus: NEGATIVE

## 2012-11-01 LAB — MAGNESIUM: Magnesium: 1.8 mg/dL (ref 1.5–2.5)

## 2012-11-01 SURGERY — COLOSTOMY CLOSURE
Anesthesia: General | Site: Abdomen

## 2012-11-01 MED ORDER — DEXAMETHASONE SODIUM PHOSPHATE 4 MG/ML IJ SOLN
INTRAMUSCULAR | Status: DC | PRN
  Administered 2012-11-01: 10 mg via INTRAVENOUS

## 2012-11-01 MED ORDER — MIDAZOLAM HCL 5 MG/5ML IJ SOLN
INTRAMUSCULAR | Status: DC | PRN
  Administered 2012-11-01: 1 mg via INTRAVENOUS

## 2012-11-01 MED ORDER — ENOXAPARIN SODIUM 40 MG/0.4ML ~~LOC~~ SOLN
40.0000 mg | SUBCUTANEOUS | Status: DC
  Administered 2012-11-02 – 2012-11-06 (×5): 40 mg via SUBCUTANEOUS
  Filled 2012-11-01 (×7): qty 0.4

## 2012-11-01 MED ORDER — PROPOFOL 10 MG/ML IV EMUL
INTRAVENOUS | Status: DC | PRN
  Administered 2012-11-01: 200 mg via INTRAVENOUS

## 2012-11-01 MED ORDER — OXYCODONE HCL 5 MG/5ML PO SOLN: 5.0000 mg | Freq: Once | ORAL | Status: DC | PRN

## 2012-11-01 MED ORDER — FENTANYL CITRATE 0.05 MG/ML IJ SOLN
INTRAMUSCULAR | Status: DC | PRN
  Administered 2012-11-01 (×2): 50 ug via INTRAVENOUS
  Administered 2012-11-01: 25 ug via INTRAVENOUS
  Administered 2012-11-01 (×2): 50 ug via INTRAVENOUS
  Administered 2012-11-01: 25 ug via INTRAVENOUS

## 2012-11-01 MED ORDER — 0.9 % SODIUM CHLORIDE (POUR BTL) OPTIME
TOPICAL | Status: DC | PRN
  Administered 2012-11-01: 3000 mL

## 2012-11-01 MED ORDER — HYDROMORPHONE HCL PF 1 MG/ML IJ SOLN
0.2500 mg | INTRAMUSCULAR | Status: DC | PRN
  Administered 2012-11-01 (×2): 0.5 mg via INTRAVENOUS

## 2012-11-01 MED ORDER — PROMETHAZINE HCL 25 MG/ML IJ SOLN
6.2500 mg | INTRAMUSCULAR | Status: DC | PRN
  Administered 2012-11-01: 12.5 mg via INTRAVENOUS

## 2012-11-01 MED ORDER — NALOXONE HCL 0.4 MG/ML IJ SOLN: 0.4000 mg | INTRAMUSCULAR | Status: DC | PRN

## 2012-11-01 MED ORDER — NEOSTIGMINE METHYLSULFATE 1 MG/ML IJ SOLN
INTRAMUSCULAR | Status: DC | PRN
  Administered 2012-11-01: 3 mg via INTRAVENOUS

## 2012-11-01 MED ORDER — MENTHOL 3 MG MT LOZG: 1.0000 | LOZENGE | OROMUCOSAL | Status: DC | PRN

## 2012-11-01 MED ORDER — SUCCINYLCHOLINE CHLORIDE 20 MG/ML IJ SOLN
INTRAMUSCULAR | Status: DC | PRN
  Administered 2012-11-01: 140 mg via INTRAVENOUS

## 2012-11-01 MED ORDER — KETOROLAC TROMETHAMINE 15 MG/ML IJ SOLN
15.0000 mg | Freq: Four times a day (QID) | INTRAMUSCULAR | Status: DC | PRN
  Administered 2012-11-03 (×2): 15 mg via INTRAVENOUS
  Filled 2012-11-01 (×2): qty 1

## 2012-11-01 MED ORDER — OXYCODONE HCL 5 MG PO TABS: 5.0000 mg | ORAL_TABLET | Freq: Once | ORAL | Status: DC | PRN

## 2012-11-01 MED ORDER — CEFOXITIN SODIUM 1 G IV SOLR
1.0000 g | Freq: Four times a day (QID) | INTRAVENOUS | Status: AC
  Administered 2012-11-01 – 2012-11-02 (×2): 1 g via INTRAVENOUS
  Filled 2012-11-01 (×6): qty 1

## 2012-11-01 MED ORDER — DIPHENHYDRAMINE HCL 12.5 MG/5ML PO ELIX: 12.5000 mg | ORAL_SOLUTION | Freq: Four times a day (QID) | ORAL | Status: DC | PRN

## 2012-11-01 MED ORDER — ONDANSETRON HCL 4 MG/2ML IJ SOLN
INTRAMUSCULAR | Status: DC | PRN
  Administered 2012-11-01 (×2): 2 mg via INTRAVENOUS

## 2012-11-01 MED ORDER — MEPERIDINE HCL 50 MG/ML IJ SOLN: 6.2500 mg | INTRAMUSCULAR | Status: DC | PRN

## 2012-11-01 MED ORDER — LIDOCAINE HCL (CARDIAC) 20 MG/ML IV SOLN
INTRAVENOUS | Status: DC | PRN
  Administered 2012-11-01: 30 mg via INTRAVENOUS

## 2012-11-01 MED ORDER — ONDANSETRON HCL 4 MG/2ML IJ SOLN
4.0000 mg | Freq: Four times a day (QID) | INTRAMUSCULAR | Status: DC | PRN
  Administered 2012-11-01: 4 mg via INTRAVENOUS
  Filled 2012-11-01: qty 2

## 2012-11-01 MED ORDER — LACTATED RINGERS IV SOLN
INTRAVENOUS | Status: DC | PRN
  Administered 2012-11-01 (×2): via INTRAVENOUS

## 2012-11-01 MED ORDER — ACETAMINOPHEN 10 MG/ML IV SOLN: 1000.0000 mg | Freq: Once | INTRAVENOUS | Status: DC | PRN

## 2012-11-01 MED ORDER — HYDROMORPHONE 0.3 MG/ML IV SOLN
INTRAVENOUS | Status: DC
  Administered 2012-11-01: 0.9 mg via INTRAVENOUS
  Administered 2012-11-01: 0.6 mg via INTRAVENOUS
  Administered 2012-11-01: 13:00:00 via INTRAVENOUS
  Administered 2012-11-02: 2.1 mg via INTRAVENOUS

## 2012-11-01 MED ORDER — ACETAMINOPHEN 10 MG/ML IV SOLN
INTRAVENOUS | Status: DC | PRN
  Administered 2012-11-01: 1000 mg via INTRAVENOUS

## 2012-11-01 MED ORDER — ONDANSETRON HCL 4 MG/2ML IJ SOLN
4.0000 mg | Freq: Four times a day (QID) | INTRAMUSCULAR | Status: DC | PRN
  Administered 2012-11-02 (×2): 4 mg via INTRAVENOUS
  Filled 2012-11-01: qty 2

## 2012-11-01 MED ORDER — LIP MEDEX EX OINT
TOPICAL_OINTMENT | CUTANEOUS | Status: DC | PRN
  Administered 2012-11-03: 18:00:00 via TOPICAL
  Filled 2012-11-01 (×2): qty 7

## 2012-11-01 MED ORDER — ROCURONIUM BROMIDE 100 MG/10ML IV SOLN
INTRAVENOUS | Status: DC | PRN
  Administered 2012-11-01: 10 mg via INTRAVENOUS
  Administered 2012-11-01: 20 mg via INTRAVENOUS
  Administered 2012-11-01 (×2): 10 mg via INTRAVENOUS

## 2012-11-01 MED ORDER — DEXTROSE 5 % IV SOLN
2.0000 g | INTRAVENOUS | Status: DC | PRN
  Administered 2012-11-01: 2 g via INTRAVENOUS

## 2012-11-01 MED ORDER — GLYCOPYRROLATE 0.2 MG/ML IJ SOLN
INTRAMUSCULAR | Status: DC | PRN
  Administered 2012-11-01: .5 mg via INTRAVENOUS

## 2012-11-01 MED ORDER — ONDANSETRON HCL 4 MG PO TABS: 4.0000 mg | ORAL_TABLET | Freq: Four times a day (QID) | ORAL | Status: DC | PRN

## 2012-11-01 MED ORDER — HYDROMORPHONE HCL PF 1 MG/ML IJ SOLN
INTRAMUSCULAR | Status: DC | PRN
  Administered 2012-11-01 (×2): 1 mg via INTRAVENOUS

## 2012-11-01 MED ORDER — LACTATED RINGERS IV SOLN
INTRAVENOUS | Status: DC | PRN
  Administered 2012-11-01: 09:00:00 via INTRAVENOUS

## 2012-11-01 MED ORDER — DIPHENHYDRAMINE HCL 50 MG/ML IJ SOLN: 12.5000 mg | Freq: Four times a day (QID) | INTRAMUSCULAR | Status: DC | PRN

## 2012-11-01 MED ORDER — PANTOPRAZOLE SODIUM 40 MG IV SOLR
40.0000 mg | INTRAVENOUS | Status: DC
  Administered 2012-11-01 – 2012-11-06 (×6): 40 mg via INTRAVENOUS
  Filled 2012-11-01 (×9): qty 40

## 2012-11-01 MED ORDER — LORAZEPAM 2 MG/ML IJ SOLN
1.0000 mg | Freq: Four times a day (QID) | INTRAMUSCULAR | Status: DC | PRN
  Administered 2012-11-03: 1 mg via INTRAVENOUS
  Filled 2012-11-01: qty 1

## 2012-11-01 MED ORDER — SODIUM CHLORIDE 0.9 % IJ SOLN: 9.0000 mL | INTRAMUSCULAR | Status: DC | PRN

## 2012-11-01 SURGICAL SUPPLY — 59 items
APPLICATOR COTTON TIP 6IN STRL (MISCELLANEOUS) ×8 IMPLANT
BLADE EXTENDED COATED 6.5IN (ELECTRODE) ×1 IMPLANT
BLADE HEX COATED 2.75 (ELECTRODE) ×4 IMPLANT
BLADE SURG SZ10 CARB STEEL (BLADE) ×3 IMPLANT
CANISTER SUCTION 2500CC (MISCELLANEOUS) ×3 IMPLANT
CLAMP POUCH DRAINAGE QUIET (OSTOMY) ×1 IMPLANT
CLIP TI LARGE 6 (CLIP) IMPLANT
CLOTH BEACON ORANGE TIMEOUT ST (SAFETY) ×4 IMPLANT
COVER MAYO STAND STRL (DRAPES) ×4 IMPLANT
COVER SURGICAL LIGHT HANDLE (MISCELLANEOUS) ×3 IMPLANT
DRAIN CHANNEL 19F RND (DRAIN) ×1 IMPLANT
DRAPE LAPAROSCOPIC ABDOMINAL (DRAPES) ×4 IMPLANT
DRAPE LG THREE QUARTER DISP (DRAPES) IMPLANT
DRAPE WARM FLUID 44X44 (DRAPE) ×4 IMPLANT
DRSG VAC ATS MED SENSATRAC (GAUZE/BANDAGES/DRESSINGS) ×2 IMPLANT
ELECT REM PT RETURN 9FT ADLT (ELECTROSURGICAL) ×4
ELECTRODE REM PT RTRN 9FT ADLT (ELECTROSURGICAL) ×3 IMPLANT
EVACUATOR SILICONE 100CC (DRAIN) ×1 IMPLANT
GLOVE BIOGEL PI IND STRL 7.0 (GLOVE) ×3 IMPLANT
GLOVE BIOGEL PI INDICATOR 7.0 (GLOVE) ×1
GLOVE INDICATOR 8.0 STRL GRN (GLOVE) ×8 IMPLANT
GLOVE SS BIOGEL STRL SZ 8 (GLOVE) ×3 IMPLANT
GLOVE SUPERSENSE BIOGEL SZ 8 (GLOVE) ×1
GOWN STRL NON-REIN LRG LVL3 (GOWN DISPOSABLE) ×3 IMPLANT
GOWN STRL REIN XL XLG (GOWN DISPOSABLE) ×9 IMPLANT
KIT BASIN OR (CUSTOM PROCEDURE TRAY) ×4 IMPLANT
LEGGING LITHOTOMY PAIR STRL (DRAPES) IMPLANT
LIGASURE IMPACT 36 18CM CVD LR (INSTRUMENTS) IMPLANT
MANIFOLD NEPTUNE II (INSTRUMENTS) ×1 IMPLANT
NS IRRIG 1000ML POUR BTL (IV SOLUTION) ×8 IMPLANT
PACK GENERAL/GYN (CUSTOM PROCEDURE TRAY) ×4 IMPLANT
POUCH OSTOMY 1 3/4  H (OSTOMY) ×1 IMPLANT
SCALPEL HARMONIC ACE (MISCELLANEOUS) IMPLANT
SHEARS FOC LG CVD HARMONIC 17C (MISCELLANEOUS) IMPLANT
SPONGE DRAIN TRACH 4X4 STRL 2S (GAUZE/BANDAGES/DRESSINGS) ×1 IMPLANT
SPONGE GAUZE 4X4 12PLY (GAUZE/BANDAGES/DRESSINGS) ×3 IMPLANT
STAPLER VISISTAT 35W (STAPLE) ×4 IMPLANT
SUCTION POOLE TIP (SUCTIONS) ×4 IMPLANT
SUT ETHILON 2 0 PS N (SUTURE) ×1 IMPLANT
SUT NOVA 1 T20/GS 25DT (SUTURE) ×2 IMPLANT
SUT PDS AB 1 CTX 36 (SUTURE) IMPLANT
SUT PDS AB 1 TP1 96 (SUTURE) ×2 IMPLANT
SUT PDS AB 3-0 SH 27 (SUTURE) IMPLANT
SUT PDS AB 4-0 SH 27 (SUTURE) IMPLANT
SUT PROLENE 2 0 BLUE (SUTURE) IMPLANT
SUT SILK 2 0 (SUTURE) ×4
SUT SILK 2 0 SH CR/8 (SUTURE) ×1 IMPLANT
SUT SILK 2 0SH CR/8 30 (SUTURE) IMPLANT
SUT SILK 2-0 18XBRD TIE 12 (SUTURE) ×3 IMPLANT
SUT SILK 2-0 30XBRD TIE 12 (SUTURE) IMPLANT
SUT SILK 3 0 (SUTURE) ×4
SUT SILK 3 0 SH CR/8 (SUTURE) ×1 IMPLANT
SUT SILK 3-0 18XBRD TIE 12 (SUTURE) ×3 IMPLANT
SUT VIC AB 2-0 SH 18 (SUTURE) ×5 IMPLANT
SUT VIC AB 3-0 SH 18 (SUTURE) ×3 IMPLANT
TOWEL OR 17X26 10 PK STRL BLUE (TOWEL DISPOSABLE) ×4 IMPLANT
TOWEL OR NON WOVEN STRL DISP B (DISPOSABLE) ×1 IMPLANT
TRAY FOLEY CATH 14FRSI W/METER (CATHETERS) ×4 IMPLANT
YANKAUER SUCT BULB TIP NO VENT (SUCTIONS) ×4 IMPLANT

## 2012-11-01 NOTE — Brief Op Note (Signed)
10/31/2012 - 11/01/2012  11:55 AM  PATIENT:  [name]  52 y.o. female  PRE-OPERATIVE DIAGNOSIS:  Parastomal hernia and small bowel obstruction  POST-OPERATIVE DIAGNOSIS:  Parastomal hernia and small bowel obstruction  PROCEDURE:  Procedure(s) with comments: COLOSTOMY CLOSURE (N/A) - REPAIR PARASTOMAL HERNIA; REVISION OF COLOSTOMY EXPLORATORY LAPAROTOMY - exploratory laparotomy,repair of parastomal hernia and incisional hernia, lysis of adhesions, and application of wound V.A.C. HERNIA REPAIR PARASTOMAL (N/A) HERNIA REPAIR INCISIONAL (N/A) LYSIS OF ADHESION (N/A) APPLICATION OF WOUND VAC (N/A)  SURGEON:  Surgeon(s) and Role:    * Raun Routh A. Nakkia Mackiewicz, MD - Primary    * Emelia Loron, MD - Assisting   ASSISTANTS: none   ANESTHESIA:   general  EBL:  Total I/O In: 1500 [I.V.:1500] Out: 700 [Urine:700]  BLOOD ADMINISTERED:none  DRAINS: (19 F) Jackson-Pratt drain(s) with closed bulb suction in the LEFT LOWER QUADRANT   LOCAL MEDICATIONS USED:  NONE  SPECIMEN:  No Specimen  DISPOSITION OF SPECIMEN:  N/A  COUNTS:  YES  TOURNIQUET:  * No tourniquets in log *  DICTATION: .Other Dictation: Dictation Number  [id number]  PLAN OF CARE: Admit to inpatient   PATIENT DISPOSITION:  PACU - hemodynamically stable.   Delay start of Pharmacological VTE agent (>24hrs) due to surgical blood loss or risk of bleeding: no

## 2012-11-01 NOTE — Transfer of Care (Signed)
Immediate Anesthesia Transfer of Care Note  Patient: [name]  Procedure(s) Performed: Procedure(s) with comments: COLOSTOMY CLOSURE (N/A) - REPAIR PARASTOMAL HERNIA; REVISION OF COLOSTOMY EXPLORATORY LAPAROTOMY - exploratory laparotomy,repair of parastomal hernia and incisional hernia, lysis of adhesions, and application of wound V.A.C. HERNIA REPAIR PARASTOMAL (N/A) HERNIA REPAIR INCISIONAL (N/A) LYSIS OF ADHESION (N/A) APPLICATION OF WOUND VAC (N/A)  Patient Location: PACU  Anesthesia Type:General  Level of Consciousness: awake, oriented and patient cooperative  Airway & Oxygen Therapy: Patient Spontanous Breathing and Patient connected to face mask oxygen  Post-op Assessment: Report given to PACU RN, Post -op Vital signs reviewed and stable and Patient moving all extremities  Post vital signs: Reviewed and stable  Complications: No apparent anesthesia complications

## 2012-11-01 NOTE — Progress Notes (Signed)
Spoke to PACU Nurse about EKG with sinus rhythm with arrhythmia, awaiting call back from Dub Mikes RN 11-01-2012 8:13am

## 2012-11-01 NOTE — Interval H&P Note (Signed)
History and Physical Interval Note:  11/01/2012 8:56 AM  [name]  has presented today for surgery, with the diagnosis of Parastomal hernia  The various methods of treatment have been discussed with the patient and family. After consideration of risks, benefits and other options for treatment, the patient has consented to  Procedure(s) with comments: COLOSTOMY CLOSURE (N/A) - REPAIR PARASTOMAL HERNIA; REVISION OF COLOSTOMY as a surgical intervention .  The patient's history has been reviewed, patient examined, no change in status, stable for surgery.  I have reviewed the patient's chart and labs.  Questions were answered to the patient's satisfaction.     [name] A.

## 2012-11-01 NOTE — Op Note (Signed)
NAMESHERROL, VICARS NO.:  [id number]  MEDICAL RECORD NO.:  [id number]  LOCATION:  1529                         FACILITY:  [facility]  PHYSICIAN:  Maisie Fus A. 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  POSTOPERATIVE DIAGNOSIS: 1. 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 2. Incisional hernia. 3. Dense intra-abdominal adhesions.  PROCEDURE: 1. Exploratory laparotomy with closure of parastomal hernia primarily. 2. Extensive lysis of adhesions taking 1 hour. 3. Repair of multiple small incisional hernia, which was primary.  SURGEON:  Maisie Fus A. Generoso Cropper, M.D.  ASSISTANT:  Juanetta Gosling, MD  ESTIMATED BLOOD LOSS:  Approximately 300 mL.  SPECIMENS:  None.  DRAINS:  A 19 round Blake drain to the left lower quadrant hernia sac region.  IV FLUIDS:  Approximately 2.5 L crystalloid.  SPECIMENS:  None.  INDICATIONS FOR PROCEDURE:  The patient is a 52 year old female, who underwent a sigmoid colectomy and colostomy in 2010 for perforated diverticulitis.  She never had her colostomy reversed due to financial reasons.  She has been seen off and on throughout the last 2-3 years for intermittent abdominal pain and partial small bowel obstructions.  CT scans have been obtained, which shows a chronic peristomal hernia with a significant amount of small bowel within it.  This has resolved without operation, and she has not desired repair.  She came to the emergency room with increasing abdominal pain for 2 weeks.  She has had more bloating at the site of her ostomy.  CT scan showed a large amount of small bowel incarcerated in her peristomal hernia in the left lower quadrant with small bowel obstruction.  I met  with the patient. Discussed her options.  This could be managed non-operatively to see if this would resolve.  Once that she could be prepped and potentially have her colostomy reversed.  Since she had a bowel obstruction, this was not possible.  I recommended exploratory laparotomy with reduction of her hernia and either relocation of it or primary closure with the hope that we could close her colostomy sometime in the next year or so.  Risk of bleeding, infection, bowel injury, enterocutaneous fistula, wound breakdown, recurrent hernia, bowel obstruction, death, DVT, sepsis, respiratory failure, organ failure, the need for further operative procedures were discussed with the patient and her husband at the bedside.  After lengthy discussion of the above, they wished to proceed since she did have a bowel obstruction ans was having considerable amount of pain.  DESCRIPTION OF PROCEDURE:  The patient was brought to the operating room.  Questions were answered.  She was taken back and placed supine on the operating table.  After induction of anesthesia, Foley catheter was placed under sterile conditions, and then the stoma was closed with pursestring suture of 2-0 silk.  She had an ulceration to the lateral of this, which is where her large parastomal hernia was.  The abdomen was prepped and draped in the sterile fashion.  Cefoxitin was given 1 g. Time-out was done.  Midline incision was then used.  Fascia was encountered, and she had multiple small incisional hernias with incarcerated omentum.  We irrigated the abdominal cavity, and then it took about an hour to take all the adhesions down around this large parastomal hernia.  I reduced the small bowel out of this, and she had a small bowel obstruction from adhesions right at the small bowel into the parastomal hernia, which looked like it was adhered into the hernia sac. We carefully took all this down.  I then ran the small bowel from  the ligament of Treitz to the ileocecal valve.  She had some pelvic adhesions, but these were not obstructing.  Given the condition of her abdominal wall, I had some concerns about moving her ostomy since it was very thin, and she had multiple incisional hernia.  Ideally, it would be best to close her colostomy, but given her obstruction, I did not feel it was prudent.  She was not prepped either.  I felt that closure of the parastomal hernia was her best option and felt that using sutures realizing this would breakdown, but with the hope that this would be performed after all if we could come back and close her colostomy in 3-6 months.  We were able to grasp the edges of the parastomal hernia.  Care was taken to protect the colostomy itself, and then sutures of #1 Novafil were used to narrow down the opening.  We put these on both the inferior and superior sides of the parastomal hernia.  It was narrowed down to where I can place the 2 fingers through the new fascial defect. Care was taken not to injure the colon.  Once this was narrowed down, I felt it was adequate enough to where this could by sometime let this heal up and then return down the road to take all this down and hopefully close all this at a later time.  The transition point was identified in the hernia sac.  We then examined the small bowel carefully.  There was a small area where the serosa was bleeding, and I over sewed this with 3-0 Vicryl pop offs.  Remainder of small bowel was without injury.  The transverse colon and descending colon were intact. She had adequate colonic length.  The irrigation was used and suctioned out.  We then carefully closed the fascia with #1 running PDS suture. Care was taken not to get too close the colostomy, and I placed my finger by to make sure the suture was well away from that.  It did actually pull the colostomy over and narrowed the opening through the abdominal wall a little bit  more, which made very snug but not obstructing opening for the colon.  Once the fascia was then closed, we also closed the multiple small hernia defects using the suture, which she had 3 along the inferior portion of her fascia.  These were all about 1.5 cm in size.  We then irrigated the wound and placed a wound VAC.  She had a large hernia sac, therefore with separate stab incision just lateral, placed a 19 round Blake drain in this hernia sac area, which was adjacent to the colon care taken not to injure the colon.  I then carefully took out my pursestring suture at the colostomy site and used retractors to make sure we had not snagged the colon in our repair. I could feel the suture line, but  upon examining the mucosa, was intact over the sutures, and there was ample room for my finger to slide through the defect, but it was not too loose to where I felt it might hernia around it.  This was carefully examined with no signs of any violation of the colonic wall mucosa, but I could feel the sutures at the very bottom of the colostomy at the fascia but did not feel or could not see that these were penetrating the colon, but this was examined very carefully at the end of the case.  We then reapplied the appliance. She had small skin ulceration, which was probably from pressure from the colostomy, and this was cleaned and placed antibiotic ointment over and dry dressing.  Drain was placed to bulb suction.  All final counts of sponge, needle, and instruments were found to be correct at this portion of the case.  The patient was then awoke, extubated, taken to recovery in stable condition.     Kerington Hildebrant A. Maebell Lyvers, M.D.     TAC/MEDQ  D:  11/01/2012  T:  11/01/2012  Job:  [id number]

## 2012-11-01 NOTE — Anesthesia Postprocedure Evaluation (Signed)
Anesthesia Post Note  Patient: [name]  Procedure(s) Performed: Procedure(s) (LRB): COLOSTOMY CLOSURE (N/A) EXPLORATORY LAPAROTOMY HERNIA REPAIR PARASTOMAL (N/A) HERNIA REPAIR INCISIONAL (N/A) LYSIS OF ADHESION (N/A) APPLICATION OF WOUND VAC (N/A)  Anesthesia type: General  Patient location: PACU  Post pain: Pain level controlled  Post assessment: Post-op Vital signs reviewed  Last Vitals: BP 155/79  Pulse 80  Temp(Src) 36.7 C (Oral)  Resp 19  Ht 5\' 9"  (1.753 m)  Wt 230 lb (104.327 kg)  BMI 33.95 kg/m2  SpO2 94%  LMP 09/21/2011  Post vital signs: Reviewed  Level of consciousness: sedated  Complications: No apparent anesthesia complications

## 2012-11-01 NOTE — Progress Notes (Signed)
Paged MD Daphine Deutscher regarding patient's EKG result Stanford Breed RN 11-01-2012 7:55pm

## 2012-11-01 NOTE — Anesthesia Preprocedure Evaluation (Addendum)
Anesthesia Evaluation  Patient identified by MRN, date of birth, ID band Patient awake    Reviewed: Allergy & Precautions, H&P , NPO status , Patient's Chart, lab work & pertinent test results  Airway       Dental  (+) Dental Advisory Given and Missing All except two teeth missing upper.  Nothing loose:   Pulmonary asthma , Current Smoker,    Pulmonary exam normal       Cardiovascular - Past MI     Neuro/Psych  Headaches, Anxiety  Neuromuscular disease    GI/Hepatic Neg liver ROS, GERD-  Medicated and Controlled,  Endo/Other  negative endocrine ROS  Renal/GU Renal diseasenegative Renal ROS     Musculoskeletal  (+) Fibromyalgia -  Abdominal (+) + obese,   Peds  Hematology negative hematology ROS (+)   Anesthesia Other Findings   Reproductive/Obstetrics                         Anesthesia Physical  Anesthesia Plan  ASA: II  Anesthesia Plan: General   Post-op Pain Management:    Induction: Intravenous  Airway Management Planned: Oral ETT  Additional Equipment:   Intra-op Plan:   Post-operative Plan: Extubation in OR  Informed Consent: I have reviewed the patients History and Physical, chart, labs and discussed the procedure including the risks, benefits and alternatives for the proposed anesthesia with the patient or authorized representative who has indicated his/her understanding and acceptance.   Dental advisory given  Plan Discussed with: CRNA  Anesthesia Plan Comments:       Anesthesia Quick Evaluation

## 2012-11-01 NOTE — Progress Notes (Deleted)
Patient complains of sore throat and would like lozenges ordered, ordered received from [place] MD Stanford Breed RN 11-01-2012 16:24pm

## 2012-11-02 ENCOUNTER — Inpatient Hospital Stay (HOSPITAL_COMMUNITY): Payer: 59

## 2012-11-02 ENCOUNTER — Encounter (HOSPITAL_COMMUNITY): Payer: Self-pay | Admitting: Internal Medicine

## 2012-11-02 DIAGNOSIS — K435 Parastomal hernia without obstruction or  gangrene: Secondary | ICD-10-CM

## 2012-11-02 DIAGNOSIS — F411 Generalized anxiety disorder: Secondary | ICD-10-CM

## 2012-11-02 DIAGNOSIS — F19239 Other psychoactive substance dependence with withdrawal, unspecified: Secondary | ICD-10-CM

## 2012-11-02 DIAGNOSIS — M797 Fibromyalgia: Secondary | ICD-10-CM

## 2012-11-02 DIAGNOSIS — R7309 Other abnormal glucose: Secondary | ICD-10-CM

## 2012-11-02 DIAGNOSIS — J9601 Acute respiratory failure with hypoxia: Secondary | ICD-10-CM

## 2012-11-02 DIAGNOSIS — J441 Chronic obstructive pulmonary disease with (acute) exacerbation: Secondary | ICD-10-CM

## 2012-11-02 DIAGNOSIS — F17200 Nicotine dependence, unspecified, uncomplicated: Secondary | ICD-10-CM

## 2012-11-02 DIAGNOSIS — E66812 Obesity, class 2: Secondary | ICD-10-CM

## 2012-11-02 DIAGNOSIS — D72829 Elevated white blood cell count, unspecified: Secondary | ICD-10-CM

## 2012-11-02 DIAGNOSIS — J9600 Acute respiratory failure, unspecified whether with hypoxia or hypercapnia: Secondary | ICD-10-CM

## 2012-11-02 DIAGNOSIS — R0902 Hypoxemia: Secondary | ICD-10-CM

## 2012-11-02 DIAGNOSIS — J810 Acute pulmonary edema: Secondary | ICD-10-CM

## 2012-11-02 DIAGNOSIS — F17213 Nicotine dependence, cigarettes, with withdrawal: Secondary | ICD-10-CM

## 2012-11-02 DIAGNOSIS — R739 Hyperglycemia, unspecified: Secondary | ICD-10-CM

## 2012-11-02 DIAGNOSIS — G2581 Restless legs syndrome: Secondary | ICD-10-CM

## 2012-11-02 DIAGNOSIS — E669 Obesity, unspecified: Secondary | ICD-10-CM

## 2012-11-02 DIAGNOSIS — J45901 Unspecified asthma with (acute) exacerbation: Secondary | ICD-10-CM

## 2012-11-02 DIAGNOSIS — J9811 Atelectasis: Secondary | ICD-10-CM

## 2012-11-02 DIAGNOSIS — IMO0001 Reserved for inherently not codable concepts without codable children: Secondary | ICD-10-CM

## 2012-11-02 DIAGNOSIS — F19939 Other psychoactive substance use, unspecified with withdrawal, unspecified: Secondary | ICD-10-CM

## 2012-11-02 DIAGNOSIS — K56609 Unspecified intestinal obstruction, unspecified as to partial versus complete obstruction: Secondary | ICD-10-CM

## 2012-11-02 LAB — BASIC METABOLIC PANEL
BUN: 8 mg/dL (ref 6–23)
CO2: 25 mEq/L (ref 19–32)
Calcium: 8.7 mg/dL (ref 8.4–10.5)
Chloride: 101 mEq/L (ref 96–112)
Creatinine, Ser: 0.58 mg/dL (ref 0.50–1.10)
GFR calc Af Amer: >90 mL/min (ref >90)
GFR calc non Af Amer: >90 mL/min (ref >90)
Glucose, Bld: 169 mg/dL — ABNORMAL HIGH (ref 70–99)
Potassium: 3.8 mEq/L (ref 3.5–5.1)
Sodium: 135 mEq/L (ref 135–145)

## 2012-11-02 LAB — CBC
HCT: 45.1 % (ref 36.0–46.0)
Hemoglobin: 15 g/dL (ref 12.0–15.0)
MCH: 29.6 pg (ref 26.0–34.0)
MCHC: 33.3 g/dL (ref 30.0–36.0)
MCV: 89 fL (ref 78.0–100.0)
Platelets: 251 10*3/uL (ref 150–400)
RBC: 5.07 MIL/uL (ref 3.87–5.11)
RDW: 12.6 % (ref 11.5–15.5)
WBC: 19.1 10*3/uL — ABNORMAL HIGH (ref 4.0–10.5)

## 2012-11-02 LAB — URINALYSIS, ROUTINE W REFLEX MICROSCOPIC
Bilirubin Urine: NEGATIVE
Glucose, UA: NEGATIVE mg/dL
Ketones, ur: NEGATIVE mg/dL
Leukocytes, UA: NEGATIVE
Nitrite: NEGATIVE
Protein, ur: NEGATIVE mg/dL
Specific Gravity, Urine: 1.031 — ABNORMAL HIGH (ref 1.005–1.030)
Urobilinogen, UA: 0.2 mg/dL (ref 0.0–1.0)
pH: 5 (ref 5.0–8.0)

## 2012-11-02 LAB — TROPONIN I
Troponin I: <0.3 ng/mL (ref <0.30)
Troponin I: <0.3 ng/mL (ref <0.30)

## 2012-11-02 LAB — GLUCOSE, CAPILLARY
Glucose-Capillary: 121 mg/dL — ABNORMAL HIGH (ref 70–99)
Glucose-Capillary: 117 mg/dL — ABNORMAL HIGH (ref 70–99)

## 2012-11-02 LAB — URINE MICROSCOPIC-ADD ON

## 2012-11-02 MED ORDER — HYDROMORPHONE 0.3 MG/ML IV SOLN: INTRAVENOUS | Status: DC

## 2012-11-02 MED ORDER — IOHEXOL 350 MG/ML SOLN
100.0000 mL | Freq: Once | INTRAVENOUS | Status: AC | PRN
  Administered 2012-11-02: 100 mL via INTRAVENOUS

## 2012-11-02 MED ORDER — ONDANSETRON HCL 4 MG/2ML IJ SOLN: 4.0000 mg | Freq: Four times a day (QID) | INTRAMUSCULAR | Status: DC | PRN

## 2012-11-02 MED ORDER — INSULIN ASPART 100 UNIT/ML ~~LOC~~ SOLN
0.0000 [IU] | Freq: Four times a day (QID) | SUBCUTANEOUS | Status: DC
  Administered 2012-11-02 – 2012-11-03 (×4): 1 [IU] via SUBCUTANEOUS
  Administered 2012-11-06: 2 [IU] via SUBCUTANEOUS
  Administered 2012-11-06 – 2012-11-10 (×10): 1 [IU] via SUBCUTANEOUS
  Administered 2012-11-11: 2 [IU] via SUBCUTANEOUS
  Administered 2012-11-11: 1 [IU] via SUBCUTANEOUS

## 2012-11-02 MED ORDER — DIPHENHYDRAMINE HCL 12.5 MG/5ML PO ELIX: 12.5000 mg | ORAL_SOLUTION | Freq: Four times a day (QID) | ORAL | Status: DC | PRN

## 2012-11-02 MED ORDER — NALOXONE HCL 0.4 MG/ML IJ SOLN: 0.4000 mg | INTRAMUSCULAR | Status: DC | PRN

## 2012-11-02 MED ORDER — DIPHENHYDRAMINE HCL 50 MG/ML IJ SOLN: 12.5000 mg | Freq: Four times a day (QID) | INTRAMUSCULAR | Status: DC | PRN

## 2012-11-02 MED ORDER — SODIUM CHLORIDE 0.9 % IJ SOLN: 9.0000 mL | INTRAMUSCULAR | Status: DC | PRN

## 2012-11-02 MED ORDER — HYDROMORPHONE HCL PF 1 MG/ML IJ SOLN
0.5000 mg | INTRAMUSCULAR | Status: DC | PRN
  Administered 2012-11-02 – 2012-11-10 (×25): 0.5 mg via INTRAVENOUS
  Filled 2012-11-02 (×25): qty 1

## 2012-11-02 MED ORDER — HYDROMORPHONE 0.3 MG/ML IV SOLN
INTRAVENOUS | Status: DC
  Administered 2012-11-02: 1.09 mg via INTRAVENOUS

## 2012-11-02 MED ORDER — ALBUTEROL SULFATE (5 MG/ML) 0.5% IN NEBU
2.5000 mg | INHALATION_SOLUTION | Freq: Once | RESPIRATORY_TRACT | Status: AC
  Administered 2012-11-02: 2.5 mg via RESPIRATORY_TRACT
  Filled 2012-11-02: qty 0.5

## 2012-11-02 MED ORDER — MIDAZOLAM HCL 5 MG/ML IJ SOLN
INTRAMUSCULAR | Status: AC
  Filled 2012-11-02: qty 1

## 2012-11-02 MED ORDER — IPRATROPIUM BROMIDE 0.02 % IN SOLN
0.5000 mg | RESPIRATORY_TRACT | Status: DC
  Administered 2012-11-02 – 2012-11-07 (×31): 0.5 mg via RESPIRATORY_TRACT
  Filled 2012-11-02 (×30): qty 2.5

## 2012-11-02 MED ORDER — BUDESONIDE 0.5 MG/2ML IN SUSP
0.5000 mg | Freq: Two times a day (BID) | RESPIRATORY_TRACT | Status: DC
  Administered 2012-11-03 – 2012-11-11 (×17): 0.5 mg via RESPIRATORY_TRACT
  Filled 2012-11-02 (×24): qty 2

## 2012-11-02 MED ORDER — ALBUTEROL SULFATE (5 MG/ML) 0.5% IN NEBU
2.5000 mg | INHALATION_SOLUTION | RESPIRATORY_TRACT | Status: DC | PRN
  Filled 2012-11-02: qty 0.5

## 2012-11-02 MED ORDER — MIDAZOLAM HCL 5 MG/ML IJ SOLN
INTRAMUSCULAR | Status: AC
  Administered 2012-11-02: 4 mg
  Filled 2012-11-02: qty 1

## 2012-11-02 MED ORDER — FUROSEMIDE 10 MG/ML IJ SOLN
20.0000 mg | Freq: Once | INTRAMUSCULAR | Status: AC
  Administered 2012-11-02: 20 mg via INTRAVENOUS
  Filled 2012-11-02: qty 2

## 2012-11-02 MED ORDER — ONDANSETRON HCL 4 MG/2ML IJ SOLN
4.0000 mg | Freq: Four times a day (QID) | INTRAMUSCULAR | Status: DC | PRN
  Filled 2012-11-02: qty 2

## 2012-11-02 MED ORDER — ACETAMINOPHEN 10 MG/ML IV SOLN
1000.0000 mg | Freq: Four times a day (QID) | INTRAVENOUS | Status: AC
  Administered 2012-11-02 (×4): 1000 mg via INTRAVENOUS
  Filled 2012-11-02 (×4): qty 100

## 2012-11-02 MED ORDER — ALBUTEROL SULFATE (5 MG/ML) 0.5% IN NEBU
2.5000 mg | INHALATION_SOLUTION | RESPIRATORY_TRACT | Status: DC
  Administered 2012-11-02 – 2012-11-07 (×31): 2.5 mg via RESPIRATORY_TRACT
  Filled 2012-11-02 (×29): qty 0.5

## 2012-11-02 MED ORDER — FENTANYL CITRATE 0.05 MG/ML IJ SOLN
INTRAMUSCULAR | Status: AC
  Administered 2012-11-02: 100 ug
  Filled 2012-11-02: qty 4

## 2012-11-02 NOTE — Procedures (Signed)
Bronchoscopy Procedure Note [name] [id number] 1961-08-28  Procedure: Bronchoscopy Indications: Remove secretions  Procedure Details Consent: Risks of procedure as well as the alternatives and risks of each were explained to the (patient/caregiver).  Consent for procedure obtained.  Time Out: Verified patient identification, verified procedure, site/side was marked, verified correct patient position, special equipment/implants available, medications/allergies/relevent history reviewed, required imaging and test results available.  Performed  In preparation for procedure, Given nebulized lidocaine.. Sedation: 4 mg versed, 100 mcg fentanyl  Bronchoscope entered orally.  Vocal cord visualized with normal motion.  1% lidocaine instilled as needed for topical anesthesia of airways.  Bronchoscope entered into trachea, and mucosa was normal.  Bronchoscope entered into left main bronchus.  Left upper, lingular, and lower lobe orifices visualized.  No endobronchial lesions, and normal mucosa.    Bronchoscope entered into right main bronchus.  Thick, yellow mucus eminating from Rt upper/middle/lower lobes.   Vigorous suction done with removal of mucus.  Rt upper/middle/lower lobe orifices visualized w/o endobronchial lesions and normal mucosa.  Bronchoscope wedged into right middle lobe for BAL.  Instilled 60 ml of saline with 15 ml of cloudy white to yellow fluid returned.  She had transient decrease in oxygenation after procedure.  Otherwise no complications.    Will send BAL for gram stain and culture.  Updated family about findings.  Coralyn Helling, MD [facility] Pulmonary/Critical Care 11/02/2012, 2:02 PM Pager:  [phone] After 3pm call: [phone]

## 2012-11-02 NOTE — Progress Notes (Signed)
Will Marlyne Beards, PA with [facility] has been up to see patient this am, currently Dr. Elesa Hacker at bedside, informed that patient was assisted up to [facility] and desated to 76% on venti-mask at 50% and lips became cyanotic, the highest her O2 sat came up to while sitting on BSC was 85% and patient returned to bed, tachypnic and c/o SOB. Once back in bed lips pinkened and O2 sat up to 92%. RT called and in room to assess, MD states will write for further nebs and move patient to step-down unit

## 2012-11-02 NOTE — Progress Notes (Signed)
1 Day Post-Op  Subjective: Sedated, not breathing very deeply, unhappy with nurses trying to move her.  Still on venti mask.  Objective: Vital signs in last 24 hours: Temp:  [97.4 F (36.3 C)-98.8 F (37.1 C)] 98.8 F (37.1 C) (02/13 0527) Pulse Rate:  [72-96] 76 (02/13 0527) Resp:  [13-20] 18 (02/13 0527) BP: (138-176)/(63-96) 138/75 mmHg (02/13 0527) SpO2:  [87 %-100 %] 93 % (02/13 0527) FiO2 (%):  [50 %] 50 % (02/13 0406) Last BM Date: 10/30/12  O2 sats down last PM, Nothing from drain, 150 recorded from NG. Afebrile, BP was up with low sats, WBC is up BMP is normal except for her glucose 122/71  RR 20, sat 91%, HR 93  On ventimask. Intake/Output from previous day: 02/12 0701 - 02/13 0700 In: 3710 [I.V.:3500; NG/GT:110; IV Piggyback:100] Out: 2675 [Urine:2525; Emesis/NG output:150] Intake/Output this shift:    General appearance: alert and sedated and very unhappy.  Complaining of pain does not want to move. Resp: clear to auscultation bilaterally and BS down in both bases. GI: soft nothing from the drain or the ostomy.  No bowel sounds, wound vac in place  Lab Results:   Recent Labs  11/01/12 0439 11/02/12 0428  WBC 5.8 19.1*  HGB 13.8 15.0  HCT 41.7 45.1  PLT 235 251    BMET  Recent Labs  11/01/12 0439 11/02/12 0428  NA 138 135  K 4.1 3.8  CL 108 101  CO2 25 25  GLUCOSE 127* 169*  BUN 9 8  CREATININE 0.71 0.58  CALCIUM 8.5 8.7   PT/INR  Recent Labs  10/31/12 1322  LABPROT 13.2  INR 1.01     Recent Labs Lab 10/31/12 0545  AST 12  ALT <5  ALKPHOS 90  BILITOT 0.2*  PROT 7.3  ALBUMIN 3.6     Lipase     Component Value Date/Time   LIPASE 38 10/31/2012 0545     Studies/Results: Dg Chest 2 View  11/01/2012  *RADIOLOGY REPORT*  Clinical Data: Preop.  Tobacco use.  CHEST - 2 VIEW  Comparison: 07/04/2009  Findings: Normal heart size. Left upper lobe calcified granuloma. Chronic pleural changes at the lung apices.  No pneumothorax.  No  pleural effusion. Left subclavian central venous catheter removed. Mild hyperaeration.  IMPRESSION: No active cardiopulmonary disease.   Original Report Authenticated By: Jolaine Click, M.D.    Ct Abdomen Pelvis W Contrast  10/31/2012  *RADIOLOGY REPORT*  Clinical Data: Left lower quadrant pain  CT ABDOMEN AND PELVIS WITH CONTRAST  Technique:  Multidetector CT imaging of the abdomen and pelvis was performed following the standard protocol during bolus administration of intravenous contrast.  Contrast: OMNIPAQUE IOHEXOL 300 MG/ML  SOLN  Comparison: 08/24/2011  Findings: No pleural or pericardial effusion.  Scattered small pulmonary nodules are identified within the lung bases. These appears similar to the previous exam.  Similar appearance of the low attenuation structure within the right hepatic lobe measuring 5 mm, image 25.  Previous cholecystectomy.  Mild prominence of the common bile duct which measures up to 6 mm, image 31.   Normal appearance of the pancreas. The spleen is normal.  Bilateral adrenal glands are unremarkable.  The kidneys are both unremarkable. The urinary bladder is normal.  Uterus containing two fibroids identified, similar to previous exam.  There is no free fluid or fluid collections.  Calcified atherosclerotic change affects the abdominal aorta and its branches.  No aneurysm. There is no adenopathy within the upper abdomen.  No pelvic or inguinal adenopathy.  The stomach appears normal.  Abnormal dilatation of the proximal and mid small bowel loops identified.  The patient  has a left lower quadrant colostomy.  There is a large parastomal hernia containing obstructed loops of small bowel.  Transition point is identified, on image 52/series 2.  The colon appears normal in caliber.  No evidence for bowel perforation or abscess formation. No pneumatosis or free intraperitoneal air noted.  No complications associated with the rectal pouch.  Review of the visualized osseous structures is  unremarkable.  IMPRESSION:  1. Small bowel obstruction.  There is a left lower quadrant colostomy with peristomal hernia containing obstructed loops of small bowel. 2.  Similar appearance of innumerable small pulmonary nodules within the lung bases. 3.  Multiple small pulmonary nodules within the lung bases are stable from previous exam.   Original Report Authenticated By: Signa Kell, M.D.     Medications: . cefOXitin  1 g Intravenous Q6H  . enoxaparin  40 mg Subcutaneous Q24H  . HYDROmorphone PCA 0.3 mg/mL   Intravenous Q4H  . influenza  inactive virus vaccine  0.5 mL Intramuscular Tomorrow-1000  . pantoprazole (PROTONIX) IV  40 mg Intravenous Q24H    Assessment/Plan 1. 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  2. Incisional hernia. 3. Dense intra-abdominal adhesions.                     S/P 1. Exploratory laparotomy with closure of parastomal hernia primarily.  2. Extensive lysis of adhesions taking 1 hour.  3. Repair of multiple small incisional hernia, which was primary.  1. 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 Claud Kelp.  2. 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  3. Nonhealing skin ulcer left lower quadrant.  4. Fibromyalgia  5. Anxiety  6. Restless leg syndrome  7. Ongoing tobacco  8. History of nephrolithiasis  9. BMI of approximately 38.  Plan:  Decrease PCA, add IV tylenol, IS, Flutter valve, OOB, CXR, lasix 20, d/c foley, ask medicine to see and assist.  Labs in AM.    LOS: 2 days    [name] 11/02/2012

## 2012-11-02 NOTE — Consult Note (Addendum)
[facility] Medical Consultation  [name] ZOX:[id number] DOB: November 03, 1960 DOA: 10/31/2012 PCP: [name], MD   Requesting physician: Sherrie George, PA Date of consultation: 11/02/12 Reason for consultation: Hypoxia, difficulty breathing  Impression/Recommendations Principal Problem:   Acute exacerbation of COPD with asthma Active Problems:   Acute respiratory failure with hypoxia   Parastomal hernia   Fibromyalgia   Generalized anxiety disorder   Restless legs syndrome   Obesity (BMI 35.0-39.9 without comorbidity)   Leukocytosis, unspecified   Hyperglycemia   Cigarette nicotine dependence with withdrawal   Acute hypoxic respiratory failure.  Differential includes acute COPD exacerbation due to chronic smoking and has history of asthma/chronic bronchitis, pulmonary edema due to diastolic dysfunction or third spacing post-operatively, aspiration pneumonia, splinting with atelectasis, or PE.  Given her obesity, she may also have some underlying OSA or OHS.  She has never had a asleep study.  Her pulmonary exam is remarkably clear given her level of hypoxia if COPD were the only cause of her symptoms.  Given her acute decompensation and currently requiring venti mask at 50% and unclear etiology, will transfer to stepdown unit for further evaluation and management.  Would avoid bipap if possible given recent abdominal surgery.  If decompensates further, would recommend PCCM consult.  **CT demonstrates mucous plug obstructing the RLL and RML.  Ordered chest PT with right side up and spoke with PCCM about whether bronchoscopy might be needed.**  Acute COPD exacerbation -  Avoid steroids if possible -  Peak flow -  Duonebs q4h with albuterol prn -  Budesonide -  Pt received perioperative antibiotics and will defer starting more abx at this time pending CXR  ?PNA that is not audible on exam or pulmonary edema -  CXR demonstrates collapse of the right ML and RLL. -   Lasix 20mg  IV once (given prior to results above)  Rule out PE.  Pt has been on DVT prophylaxis, however, she was immobilized for a prolonged surgery yesterday.  No FHx or personal hx of PE/DVT. -  D-dimer would be elevated post-operatively -  CTa neg for PE  Consider ACS -  ECG pending -  Cycle troponins (d/c given CXR and CT findings)  Atelectasis/splinting -  Patient is able to take deep breaths on her current pain regimen -  Continue flutter/incentive spirometry.  -  Encourage OOB as able given degree of hypoxia -  Control pain.  Okay to increase pain medication keeping RASS around 1.  Large parastomal hernia with small bowel obstruction secondary to adhesions and partial incarceration in the hernia sac.  POD 1 s/p lysis of adhesions and primary closure of hernias.   -  Agree with management per surgery -  Continue PPI and NG to LIS  Fibromyalgia:   -  Hold amitryptiline  Anxiety: -  Continue ativan prn -  Consider CIWA if starting to exhibit signs of withdrawal  Restless Legs Syndrome:   -  Defer to PCP.  No anemia or microcytosis to suggest iron deficiency  Cigarette abuse:  Encouraged smoking cessation -  Nursing to provide education  Obesity -  In acute setting, especially post-operatively, pt at risk for protein malnutrition.   -  Defer weight loss counseling at this time, but pt would benefit from weight loss in future.   -  Consider outpatient sleep study.  Leukocytosis, likely reactive post-operatively -  F/u CXR -  Repeat UA  Hyperglycemia, mild and may be stress related post-operatively -  A1c -  SSI low dose q6h  I will followup again tomorrow. Please contact me if I can be of assistance in the meanwhile. Thank you for this consultation.  Chief Complaint: SOB  HPI:  The patient is a 52 year old female with history of asthma, RLS, fibromyalgia, anxiety, obesity and history of diverticulitis in 2010 resulting in left colectomy and colostomy.  She did  not return for subsequent reanastomosis to clinic.  She developed a parastomal hernia that has been monitored for the last several years and which has been getting bigger.  She presented to the ER with a 2-week history of increasing abdominal pain with nausea and vomiting due to possible incarceration of small bowel in the parastomal hernia.  She underwent surgery on 2/12 with lysis of adhesions and primary correction of the hernia and several other smaller incisional hernias.  On 2/12 to 2/13, she developed intermittent hypoxia that improved with coughing and deep breathing, but then she suddenly desaturated to the mid-80s and was unable to get her oxygen levels to the 90s with nasal canula.  She was started on venturi mask 50% and her O2 saturations increased to the low tho mid 90s.  She was given an albuterol treatment which helped symptomatically.  She later attempted to get to the bedside commode with nursing staff and desaturated to the mid-70s.  [facility] was consulted to assist with management of her acute hypoxic respiratory failure.  CXR and lasix 20mg  have been ordered and are pending.    Review of Systems:   Denies fevers, chills.  Has some nausea without vomiting.  Denies sore throat, sinus congestion.  Denies wheeze.  Has been coughing some.   Denies CP/palpitations.  No blood in stools.  No dysuria and making large amount of urine while on IVF.  Healing ulcer on abdomen.  No focal weakness or numbness.  States that breathing is getting better somewhat.    Past Medical History  Diagnosis Date  . Diverticulitis   . Fibromyalgia   . Restless leg syndrome     Bilateral  . Asthma   . Bronchitis, chronic     Effects worse with URI  . Kidney stones     at least 6 stones in past  . Headache     migraine headache  . Acid reflux disease   . Anxiety    Past Surgical History  Procedure Laterality Date  . Colostomy    . Appendectomy    . Cholecystectomy    . Partial hysterectomy       partial   Social History:  reports that she has been smoking.  She has never used smokeless tobacco. She reports that she does not drink alcohol or use illicit drugs.  No Known Allergies Family History  Problem Relation Age of Onset  . Deep vein thrombosis Neg Hx   . Pulmonary embolism Neg Hx     Prior to Admission medications   Medication Sig Start Date End Date Taking? Authorizing Provider  amitriptyline (ELAVIL) 25 MG tablet Take 25 mg by mouth at bedtime.     Yes Historical Provider, MD  Aspirin-Salicylamide-Caffeine (BC HEADACHE POWDER PO) Take 1 packet by mouth daily as needed. Back pain   Yes Historical Provider, MD  diazepam (VALIUM) 10 MG tablet Take 10 mg by mouth every 8 (eight) hours as needed for anxiety.    Yes Historical Provider, MD  omeprazole (PRILOSEC) 20 MG capsule Take 20 mg by mouth daily.     Yes Historical Provider, MD  traMADol (ULTRAM) 50 MG tablet Take 50 mg by mouth every 8 (eight) hours as needed. Maximum dose= 8 tablets per day. pain    Yes Historical Provider, MD   Physical Exam: Blood pressure 138/75, pulse 76, temperature 98.8 F (37.1 C), temperature source Axillary, resp. rate 21, height 5\' 9"  (1.753 m), weight 104.327 kg (230 lb), last menstrual period 09/21/2011, SpO2 93.00%. Filed Vitals:   11/02/12 0406 11/02/12 0446 11/02/12 0527 11/02/12 0756  BP:   138/75   Pulse:   76   Temp:   98.8 F (37.1 C)   TempSrc:   Axillary   Resp:  20 18 21   Height:      Weight:      SpO2: 93% 93% 93% 93%     General:  Obese CF, moderate respiratory distress with pursed lip breathing, mild perioral cyanosis and tachypnea  Eyes: PERRL, anicteric, noninjected  ENT: venturi mask in place with CO2 monitor in place and NG in the right nare  Neck: supple, no discernable thyromegaly  Cardiovascular: RRR, no mrg, 2+ pulses, warm extremities  Respiratory:  Diminished at the bilateral bases, but otherwise, no focal rales, rhonchi, or wheeze.  Excellent breath  sounds heard anteriorly.    Abdomen:  Hypoactive BS, soft, but TTP without rebound or guarding.  Ostomy intake, drain in place  Skin: warm and dry  Musculoskeletal: normal tone and bulk, no LEE  Psychiatric: A&Ox4  Neurologic: grossly intact  Labs on Admission:  Basic Metabolic Panel:  Recent Labs Lab 10/31/12 0545 11/01/12 0439 11/02/12 0428  NA 137 138 135  K 3.5 4.1 3.8  CL 102 108 101  CO2 23 25 25   GLUCOSE 128* 127* 169*  BUN 20 9 8   CREATININE 0.71 0.71 0.58  CALCIUM 9.0 8.5 8.7  MG  --  1.8  --    Liver Function Tests:  Recent Labs Lab 10/31/12 0545  AST 12  ALT <5  ALKPHOS 90  BILITOT 0.2*  PROT 7.3  ALBUMIN 3.6    Recent Labs Lab 10/31/12 0545  LIPASE 38   No results found for this basename: AMMONIA,  in the last 168 hours CBC:  Recent Labs Lab 10/31/12 0545 11/01/12 0439 11/02/12 0428  WBC 10.3 5.8 19.1*  NEUTROABS 6.6  --   --   HGB 15.7* 13.8 15.0  HCT 46.3* 41.7 45.1  MCV 89.2 90.5 89.0  PLT 266 235 251   Cardiac Enzymes: No results found for this basename: CKTOTAL, CKMB, CKMBINDEX, TROPONINI,  in the last 168 hours BNP: No components found with this basename: POCBNP,  CBG: No results found for this basename: GLUCAP,  in the last 168 hours  Radiological Exams on Admission: Dg Chest 2 View  11/01/2012  *RADIOLOGY REPORT*  Clinical Data: Preop.  Tobacco use.  CHEST - 2 VIEW  Comparison: 07/04/2009  Findings: Normal heart size. Left upper lobe calcified granuloma. Chronic pleural changes at the lung apices.  No pneumothorax.  No pleural effusion. Left subclavian central venous catheter removed. Mild hyperaeration.  IMPRESSION: No active cardiopulmonary disease.   Original Report Authenticated By: Jolaine Click, M.D.    Time spent: 60 min  Renae Fickle [facility] Pager [phone]  If 7PM-7AM, please contact night-coverage [email or web address] Password [facility] 11/02/2012, 9:35 AM

## 2012-11-02 NOTE — Progress Notes (Signed)
Care of patient turned over to Izell Garwin, RN

## 2012-11-02 NOTE — Consult Note (Signed)
PULMONARY  / CRITICAL CARE MEDICINE  Name: [name] MRN: [id number] DOB: Sep 23, 1960    ADMISSION DATE:  10/31/2012 CONSULTATION DATE:  11/02/2012  REFERRING MD :  [facility]  CHIEF COMPLAINT:  Short of breath  BRIEF PATIENT DESCRIPTION:  52 yo smoker admitted with abdominal pain, nausea/vomiting from partial SBP 2nd to chronic parastomal hernia, and had repair 2/12.  Transfer to SDU 2/13 due to lethargy, hypoxia and found to have mucus plugging of Rt middle and Rt lower lobes, and PCCM consulted.  SIGNIFICANT EVENTS / STUDIES:  2/12 repair of hernia and SBO 2/13 transfer to SDU, hypoxia  LINES / TUBES: None  CULTURES: Urine 2/11 >> negative  ANTIBIOTICS: Cefoxitin 2/12 >> 2/12   PAST MEDICAL HISTORY :  Past Medical History  Diagnosis Date  . Diverticulitis   . Fibromyalgia   . Restless leg syndrome     Bilateral  . Asthma   . Bronchitis, chronic     Effects worse with URI  . Kidney stones     at least 6 stones in past  . Headache     migraine headache  . Acid reflux disease   . Anxiety    Past Surgical History  Procedure Laterality Date  . Colostomy    . Appendectomy    . Cholecystectomy    . Partial hysterectomy      partial   Prior to Admission medications   Medication Sig Start Date End Date Taking? Authorizing Provider  amitriptyline (ELAVIL) 25 MG tablet Take 25 mg by mouth at bedtime.     Yes Historical Provider, MD  Aspirin-Salicylamide-Caffeine (BC HEADACHE POWDER PO) Take 1 packet by mouth daily as needed. Back pain   Yes Historical Provider, MD  diazepam (VALIUM) 10 MG tablet Take 10 mg by mouth every 8 (eight) hours as needed for anxiety.    Yes Historical Provider, MD  omeprazole (PRILOSEC) 20 MG capsule Take 20 mg by mouth daily.     Yes Historical Provider, MD  traMADol (ULTRAM) 50 MG tablet Take 50 mg by mouth every 8 (eight) hours as needed. Maximum dose= 8 tablets per day. pain    Yes Historical Provider, MD   No Known Allergies  FAMILY  HISTORY:  Family History  Problem Relation Age of Onset  . Deep vein thrombosis Neg Hx   . Pulmonary embolism Neg Hx    SOCIAL HISTORY:  reports that she has been smoking.  She has never used smokeless tobacco. She reports that she does not drink alcohol or use illicit drugs.  REVIEW OF SYSTEMS: Still has abdominal pain.  Denies chest pain, fever.  SUBJECTIVE:  Has trouble with cough >> causes abd pain.  VITAL SIGNS: Temp:  [97.4 F (36.3 C)-98.8 F (37.1 C)] 98.1 F (36.7 C) (02/13 1030) Pulse Rate:  [72-96] 91 (02/13 1100) Resp:  [13-22] 22 (02/13 1100) BP: (124-176)/(63-96) 126/76 mmHg (02/13 1100) SpO2:  [87 %-100 %] 94 % (02/13 1106) FiO2 (%):  [50 %] 50 % (02/13 1106) HEMODYNAMICS:   VENTILATOR SETTINGS: Vent Mode:  [-]  FiO2 (%):  [50 %] 50 % INTAKE / OUTPUT: Intake/Output     02/12 0701 - 02/13 0700 02/13 0701 - 02/14 0700   P.O. 0    I.V. (mL/kg) 3500 (33.5) 400.2 (3.8)   NG/GT 110    IV Piggyback 100 100   Total Intake(mL/kg) 3710 (35.6) 500.2 (4.8)   Urine (mL/kg/hr) 2525 (1)    Emesis/NG output 150 (0.1) 300 (0.6)  Total Output 2675 300   Net +1035 +200.2        Urine Occurrence  1 x     PHYSICAL EXAMINATION: General:  Ill appearing, speaks in full sentences Neuro:  Alert, follows commands, normal strength HEENT:  No sinus tenderness, MP 3 airway Cardiovascular:  s1s2 regular, no murmur Lungs:  Decreased breath sounds Rt base, faint b/l wheezing Abdomen:  Wound vac and LLQ colostomy in place Musculoskeletal:  No edema Skin:  No rashes  LABS:  Recent Labs Lab 10/31/12 0545 10/31/12 1322 11/01/12 0439 11/02/12 0428  HGB 15.7*  --  13.8 15.0  WBC 10.3  --  5.8 19.1*  PLT 266  --  235 251  NA 137  --  138 135  K 3.5  --  4.1 3.8  CL 102  --  108 101  CO2 23  --  25 25  GLUCOSE 128*  --  127* 169*  BUN 20  --  9 8  CREATININE 0.71  --  0.71 0.58  CALCIUM 9.0  --  8.5 8.7  MG  --   --  1.8  --   AST 12  --   --   --   ALT <5  --   --    --   ALKPHOS 90  --   --   --   BILITOT 0.2*  --   --   --   PROT 7.3  --   --   --   ALBUMIN 3.6  --   --   --   APTT  --  33  --   --   INR  --  1.01  --   --    No results found for this basename: GLUCAP,  in the last 168 hours  Imaging: Dg Chest 1 View  11/02/2012  *RADIOLOGY REPORT*  Clinical Data: Postoperative hypoxia.  CHEST - 1 VIEW  Comparison: CT chest 11/02/2012 and chest radiograph 11/01/2012.  Findings: Nasogastric tube terminates in the stomach.  Trachea is midline.  Heart size normal.  There is collapse of the right middle and right lower lobes.  Scattered air space disease in the left lung.  No definite left pleural fluid.  IMPRESSION:  1.  Interval right middle and right lower lobe collapse, secondary to a mucous plugging. 2.  Scattered left lung air space disease.   Original Report Authenticated By: Leanna Battles, M.D.    Dg Chest 2 View  11/01/2012  *RADIOLOGY REPORT*  Clinical Data: Preop.  Tobacco use.  CHEST - 2 VIEW  Comparison: 07/04/2009  Findings: Normal heart size. Left upper lobe calcified granuloma. Chronic pleural changes at the lung apices.  No pneumothorax.  No pleural effusion. Left subclavian central venous catheter removed. Mild hyperaeration.  IMPRESSION: No active cardiopulmonary disease.   Original Report Authenticated By: Jolaine Click, M.D.    Ct Angio Chest Pe W/cm &/or Wo Cm  11/02/2012  *RADIOLOGY REPORT*  Clinical Data: acute onset of hypoxia  CT ANGIOGRAPHY CHEST  Technique:  Multidetector CT imaging of the chest using the standard protocol during bolus administration of intravenous contrast. Multiplanar reconstructed images including MIPs were obtained and reviewed to evaluate the vascular anatomy.  Contrast: OMNIPAQUE IOHEXOL 350 MG/ML SOLN the  Comparison: None  Findings: Lungs/pleura: There is no pleural effusion.  There is complete collapse / consolidation of the right lower lobe and right middle lobe. There is occlusion of the airway to the  right middle lobe and  right lower lobe.  Patchy areas of ground-glass attenuation are identified within the basilar portion of the right upper lobe and scattered throughout the left lung.  Small nonspecific pulmonary nodules are identified bilaterally.  Index nodule within the left lower lobe measures 3 mm, image 56.  Within the right upper lobe there is a 3 mm nodule, image number 36. Calcified granuloma in the superior segment left lower lobe identified.  Heart/Mediastinum: Main pulmonary artery and its branches are patent.  No evidence for acute pulmonary embolus.Normal heart size. No pericardial effusion.  Prominent mediastinal lymph nodes are identified.  Index right paratracheal lymph node measures 1.3 cm, image 34.  There is a prevascular lymph node which measures 9 mm, image 31. No enlarged axillary or supraclavicular lymph nodes identified.  Thyroid gland appears normal.  Upper abdomen: Mild diffuse low attenuation throughout the liver parenchyma noted.  Bones/Musculoskeletal:  Review of the visualized osseous structures is significant for mild thoracic spondylosis.  IMPRESSION:  1.  No evidence for acute pulmonary embolus. 2. Occlusion of the airway to the right middle lobe and right lower lobe with complete collapse of these areas.  Findings may be due to aspiration and/or mucous plugging.  This is a new finding since 10/31/2012. 3.  Scattered nonspecific pulmonary nodules.If the patient is at high risk for bronchogenic carcinoma, follow-up chest CT at 1 year is recommended.  If the patient is at low risk, no follow-up is needed.  This recommendation follows the consensus statement: Guidelines for Management of Small Pulmonary Nodules Detected on CT Scans:  A Statement from the Fleischner Society as published in Radiology 2005; 237:395-400.  4.  Prior granulomatous disease 5.  Hepatic steatosis.   Original Report Authenticated By: Signa Kell, M.D.      ASSESSMENT / PLAN:  A: Acute hypoxic  respiratory failure 2nd to mucus plugging. Hx of asthma and tobacco abuse. P:   Will need bronchoscopy 2/13 at 130 pm Will send BAL for culture >> defer Abx for now Bronchial hygiene with flutter valve Continue albuterol/ipratopium/pulmicort F/u CXR  A:  Partial SBO s/p repair. P:   Post-op care, nutrition per [facility]  A:  Lethargy likely related to narcotics and hypoxia P:  Limit narcotics Monitor mental status  Bronchoscopy procedure explained to pt.  Risks detailed as bleeding, infection, and pneumothorax.  Coralyn Helling, MD [facility] Pulmonary/Critical Care 11/02/2012, 12:03 PM Pager:  [phone] After 3pm call: [phone]

## 2012-11-02 NOTE — Progress Notes (Signed)
Pulmonary toilet and try non opiate analgesia.

## 2012-11-02 NOTE — Progress Notes (Signed)
Report called and given to ICU nurse, patient to go from radiology to room 1224 Stanford Breed RN 11-02-2012

## 2012-11-02 NOTE — Procedures (Signed)
Bedside Bronchoscopy Procedure Note [name] [id number] 08-11-1961  Procedure: Bronchoscopy Indications: Remove secretions  Procedure Details: ET Tube Size: ET Tube secured at lip (cm): Bite block in place: Yes In preparation for procedure, Patient hyper-oxygenated with 50 % FiO2 Airway entered and the following bronchi were examined: Bronchi.   Bronchoscope removed.    Evaluation BP 120/66  Pulse 101  Temp(Src) 99.4 F (37.4 C) (Axillary)  Resp 21  Ht 5\' 9"  (1.753 m)  Wt 230 lb (104.327 kg)  BMI 33.95 kg/m2  SpO2 99%  LMP 09/21/2011 Breath Sounds:Diminished O2 sats: stable throughout Patient's Current Condition: stable Specimens:  BAL specimen given to RN Complications: No apparent complications Patient did tolerate procedure well.   Jennette Kettle 11/02/2012, 2:26 PM

## 2012-11-02 NOTE — Progress Notes (Signed)
Came back from break and nurse let me know she was unable to get her O2 sats above 85%.  Day nurse had let me know that she did desat during the day but this was easily fixed by having her cough.  Was on nasal cannula at 4L/min.  Earlier this evening, pt did drop down to 89%.  Had pt cough, O2 went up to 95%.  After having pt cough, use IS, sit up, deep breathing, O2 sats would not budge above 85%.  Paged MD on call.  Ordered an albuterol treatment and if that did not maintain O2 at 92% or above, able to apply face mask.  During albuterol treatment, O2 sats reached up to 96%.  After treatment, O2 went down to 90%.  Venti-mask applied with 50% O2.  Pt's current O2 sats are 93%.  Talked with RT, will continue to monitor patient.  If sats hold, will wait for regular MD to put in orders.  If unable to keep sats at 92% or above with Venti-mask, will call [facility].    Sherron Monday

## 2012-11-03 ENCOUNTER — Encounter (HOSPITAL_COMMUNITY): Payer: Self-pay | Admitting: Surgery

## 2012-11-03 ENCOUNTER — Inpatient Hospital Stay (HOSPITAL_COMMUNITY): Payer: 59

## 2012-11-03 DIAGNOSIS — J810 Acute pulmonary edema: Secondary | ICD-10-CM

## 2012-11-03 DIAGNOSIS — J189 Pneumonia, unspecified organism: Secondary | ICD-10-CM

## 2012-11-03 DIAGNOSIS — J9819 Other pulmonary collapse: Secondary | ICD-10-CM

## 2012-11-03 DIAGNOSIS — Y95 Nosocomial condition: Secondary | ICD-10-CM

## 2012-11-03 LAB — BASIC METABOLIC PANEL
BUN: 9 mg/dL (ref 6–23)
CO2: 30 mEq/L (ref 19–32)
Calcium: 8.8 mg/dL (ref 8.4–10.5)
Chloride: 101 mEq/L (ref 96–112)
Creatinine, Ser: 0.66 mg/dL (ref 0.50–1.10)
GFR calc Af Amer: >90 mL/min (ref >90)
GFR calc non Af Amer: >90 mL/min (ref >90)
Glucose, Bld: 138 mg/dL — ABNORMAL HIGH (ref 70–99)
Potassium: 3.7 mEq/L (ref 3.5–5.1)
Sodium: 138 mEq/L (ref 135–145)

## 2012-11-03 LAB — HEMOGLOBIN A1C
Hgb A1c MFr Bld: 6.2 % — ABNORMAL HIGH (ref <5.7)
Mean Plasma Glucose: 131 mg/dL — ABNORMAL HIGH (ref <117)

## 2012-11-03 LAB — GLUCOSE, CAPILLARY
Glucose-Capillary: 128 mg/dL — ABNORMAL HIGH (ref 70–99)
Glucose-Capillary: 137 mg/dL — ABNORMAL HIGH (ref 70–99)
Glucose-Capillary: 139 mg/dL — ABNORMAL HIGH (ref 70–99)
Glucose-Capillary: 96 mg/dL (ref 70–99)

## 2012-11-03 LAB — CBC
HCT: 43.4 % (ref 36.0–46.0)
Hemoglobin: 14.6 g/dL (ref 12.0–15.0)
MCH: 30.3 pg (ref 26.0–34.0)
MCHC: 33.6 g/dL (ref 30.0–36.0)
MCV: 90 fL (ref 78.0–100.0)
Platelets: 223 10*3/uL (ref 150–400)
RBC: 4.82 MIL/uL (ref 3.87–5.11)
RDW: 12.9 % (ref 11.5–15.5)
WBC: 17.6 10*3/uL — ABNORMAL HIGH (ref 4.0–10.5)

## 2012-11-03 LAB — TROPONIN I: Troponin I: <0.3 ng/mL (ref <0.30)

## 2012-11-03 LAB — MAGNESIUM: Magnesium: 1.7 mg/dL (ref 1.5–2.5)

## 2012-11-03 MED ORDER — CHLORHEXIDINE GLUCONATE 0.12 % MT SOLN
15.0000 mL | Freq: Two times a day (BID) | OROMUCOSAL | Status: DC
  Administered 2012-11-04 – 2012-11-06 (×5): 15 mL via OROMUCOSAL
  Filled 2012-11-03 (×5): qty 15

## 2012-11-03 MED ORDER — LEVOFLOXACIN IN D5W 750 MG/150ML IV SOLN
750.0000 mg | INTRAVENOUS | Status: DC
  Filled 2012-11-03: qty 150

## 2012-11-03 MED ORDER — VANCOMYCIN HCL IN DEXTROSE 1-5 GM/200ML-% IV SOLN
1000.0000 mg | Freq: Two times a day (BID) | INTRAVENOUS | Status: DC
  Administered 2012-11-03 – 2012-11-04 (×2): 1000 mg via INTRAVENOUS
  Filled 2012-11-03 (×3): qty 200

## 2012-11-03 MED ORDER — PHENOL 1.4 % MT LIQD
1.0000 | OROMUCOSAL | Status: DC | PRN
  Administered 2012-11-03 (×3): 1 via OROMUCOSAL
  Filled 2012-11-03: qty 177

## 2012-11-03 MED ORDER — VANCOMYCIN HCL 10 G IV SOLR
2000.0000 mg | Freq: Once | INTRAVENOUS | Status: AC
  Administered 2012-11-03: 2000 mg via INTRAVENOUS
  Filled 2012-11-03 (×2): qty 2000

## 2012-11-03 MED ORDER — FUROSEMIDE 10 MG/ML IJ SOLN
40.0000 mg | Freq: Once | INTRAMUSCULAR | Status: AC
  Administered 2012-11-03: 40 mg via INTRAVENOUS
  Filled 2012-11-03: qty 4

## 2012-11-03 MED ORDER — PIPERACILLIN-TAZOBACTAM 3.375 G IVPB
3.3750 g | Freq: Three times a day (TID) | INTRAVENOUS | Status: DC
  Administered 2012-11-03 – 2012-11-10 (×22): 3.375 g via INTRAVENOUS
  Filled 2012-11-03 (×24): qty 50

## 2012-11-03 MED ORDER — BIOTENE DRY MOUTH MT LIQD
15.0000 mL | Freq: Two times a day (BID) | OROMUCOSAL | Status: DC
  Administered 2012-11-04 – 2012-11-05 (×4): 15 mL via OROMUCOSAL

## 2012-11-03 MED ORDER — ONDANSETRON HCL 4 MG/2ML IJ SOLN
4.0000 mg | Freq: Three times a day (TID) | INTRAMUSCULAR | Status: DC | PRN
  Administered 2012-11-03 – 2012-11-08 (×6): 4 mg via INTRAVENOUS
  Filled 2012-11-03 (×6): qty 2

## 2012-11-03 NOTE — Progress Notes (Signed)
Talked with husband.  Pulmonary an issue.  Vac change on sat.  Ostomy with small amount of output.

## 2012-11-03 NOTE — Consult Note (Signed)
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  Familiar with pouching routines, application, and emptying prior to admission.  Hernia repair surgery has changed appearance of stoma. Stomal assessment/size: 11/4inches, red and viable, flush with skin level.  Size is unchanged since surgery.  Pt states previously, stoma was above skin level.  Peristomal assessment: Intact skin surrounding.  Pt has full thickness wound next to stoma site which [facility] is following for assessment and plan of care. Bedside nurse states [facility] is also following for vac to midline abd and this team plans to change on Sat. Output Small amt liquid brown stool in pouch. Ostomy pouching: 1pc with barrier ring Education provided: Applied barrier ring and one piece pouch.  This is the same system pt was using prior to admission.  She states she has tried convex pouches and had breakdown to her skin so she is unable to use this type of appliance. Supplies at bedside for pt and bedside nurse use.  Pt denies further questions at this time.  Cammie Mcgee, RN, MSN, [employer]  [phone]

## 2012-11-03 NOTE — Progress Notes (Signed)
[facility] PROGRESS NOTE  [name] WUJ:[id number] DOB: 1961/01/27 DOA: 10/31/2012 PCP: [name], MD  Assessment/Plan  Acute hypoxic respiratory failure due to mucous plugging.  CXR from this morning demonstrates a possible aspiration pneumonia versus diffuse atelectasis or pulmonary edema.  Still requiring venturi mask to keep saturations in the high 80s and low 90s.  Continue to treat for mild acute COPD exacerbation.  Troponins neg and telemetry unremarkable  Mucous plug with atelectasis -  S/p bronchoscopy with removal of mucous plug on 2/13 -  CXR appears improved in terms of RML and RLL collapse this AM  Acute COPD exacerbation  - Avoid systemic steroids if possible  - Duonebs q4h with albuterol prn  - Budesonide   Bilateral pneumonia, possible aspiration pneumonia versus HAP - Start vanc and zosyn  Pulmonary edema: - Lasix 40 mg IV once  Atelectasis/splinting  - Continue flutter/incentive spirometry.  - Encourage OOB as able given degree of hypoxia  - Control pain. Okay to increase pain medication keeping RASS around 1.   Large parastomal hernia with small bowel obstruction secondary to adhesions and partial incarceration in the hernia sac. POD 1 s/p lysis of adhesions and primary closure of hernias.  - Agree with management per surgery  - Continue PPI and NG to LIS   Fibromyalgia, stable.  - Hold amitryptiline   Anxiety, stable.  - Continue ativan prn  - Consider CIWA if starting to exhibit signs of withdrawal   Restless Legs Syndrome, stable:  - Defer to PCP. No anemia or microcytosis to suggest iron deficiency   Cigarette abuse: - Nursing to provide education   Obesity  - In acute setting, especially post-operatively, pt at risk for protein malnutrition.  - Defer weight loss counseling at this time, but pt would benefit from weight loss in future.  - Consider outpatient sleep study.   Leukocytosis, trending down.  likely reactive  post-operatively vs. Pneumonia as above - trend  Hyperglycemia, mild and may be stress related post-operatively, well controlled. - A1c pending - SSI low dose q6h   I will sign off as PCCM is managing respiratory problems at this time. Thank you for this consultation and please reconsult if needed.     HPI/Subjective: Patient states that she continues to have uncontrolled abdominal pain.  She denies chest pain.  Feels somewhat [name] of breath and cannot breath through her nose.  Cough.  Has had nausea that improved with zofran.    Objective: Filed Vitals:   11/03/12 0400 11/03/12 0447 11/03/12 0600 11/03/12 0803  BP: 143/81  124/66   Pulse: 100  104   Temp: 97.6 F (36.4 C)     TempSrc: Oral     Resp: 24  27   Height:      Weight:      SpO2: 96% 98% 98% 95%    Intake/Output Summary (Last 24 hours) at 11/03/12 0805 Last data filed at 11/03/12 0700  Gross per 24 hour  Intake 890.17 ml  Output    925 ml  Net -34.83 ml   Filed Weights   10/31/12 1700  Weight: 104.327 kg (230 lb)    Exam:  General: Obese CF, moderate respiratory distress with tachypnea.   O2 saturations on 6L [place] are in the mid-80s.  Increased to face mask during exam.   Eyes: PERRL, anicteric, noninjected  ENT:  NG in the right nare  Neck: supple, no discernable thyromegaly  Cardiovascular: RRR, no mrg, 2+ pulses, warm extremities  Respiratory:  Diminished at the bilateral bases, Course rales to the apices.  No wheeze, diffuse rhonchi.   Abdomen:  Hypoactive BS, soft, but TTP without rebound or guarding diffusely. Ostomy in place, wound vac in place.  Wound edges are c/d/i.  No erythema  Skin: warm and dry  Musculoskeletal: normal tone and bulk, no LEE  Psychiatric: Somewhat sleepy and groggy, but able to answer questions.   Neurologic: grossly intact   Data Reviewed: Basic Metabolic Panel:  Recent Labs Lab 10/31/12 0545 11/01/12 0439 11/02/12 0428 11/03/12 0337  NA 137 138 135 138  K 3.5  4.1 3.8 3.7  CL 102 108 101 101  CO2 23 25 25 30   GLUCOSE 128* 127* 169* 138*  BUN 20 9 8 9   CREATININE 0.71 0.71 0.58 0.66  CALCIUM 9.0 8.5 8.7 8.8  MG  --  1.8  --  1.7   Liver Function Tests:  Recent Labs Lab 10/31/12 0545  AST 12  ALT <5  ALKPHOS 90  BILITOT 0.2*  PROT 7.3  ALBUMIN 3.6    Recent Labs Lab 10/31/12 0545  LIPASE 38   No results found for this basename: AMMONIA,  in the last 168 hours CBC:  Recent Labs Lab 10/31/12 0545 11/01/12 0439 11/02/12 0428 11/03/12 0337  WBC 10.3 5.8 19.1* 17.6*  NEUTROABS 6.6  --   --   --   HGB 15.7* 13.8 15.0 14.6  HCT 46.3* 41.7 45.1 43.4  MCV 89.2 90.5 89.0 90.0  PLT 266 235 251 223   Cardiac Enzymes:  Recent Labs Lab 11/02/12 1850 11/02/12 2050 11/02/12 2358  TROPONINI <0.30 <0.30 <0.30   BNP (last 3 results) No results found for this basename: PROBNP,  in the last 8760 hours CBG:  Recent Labs Lab 11/02/12 1155 11/02/12 1814 11/02/12 2345 11/03/12 0542  GLUCAP 121* 117* 128* 139*    Recent Results (from the past 240 hour(s))  URINE CULTURE     Status: None   Collection Time    10/31/12  7:20 AM      Result Value Range Status   Specimen Description URINE, CLEAN CATCH   Final   Special Requests NONE   Final   Culture  Setup Time 10/31/2012 15:12   Final   Colony Count NO GROWTH   Final   Culture NO GROWTH   Final   Report Status 11/01/2012 FINAL   Final  SURGICAL PCR SCREEN     Status: None   Collection Time    11/01/12  5:22 AM      Result Value Range Status   MRSA, PCR NEGATIVE  NEGATIVE Final   Staphylococcus aureus NEGATIVE  NEGATIVE Final   Comment:            The Xpert SA Assay (FDA     approved for NASAL specimens     in patients over 39 years of age),     is one component of     a comprehensive surveillance     program.  Test performance has     been validated by [employer] for patients greater     than or equal to 79 year old.     It is not intended     to diagnose  infection nor to     guide or monitor treatment.  CULTURE, BAL-QUANTITATIVE     Status: None   Collection Time    11/02/12  3:05 PM  Result Value Range Status   Specimen Description BRONCHIAL ALVEOLAR LAVAGE   Final   Special Requests NONE   Final   Gram Stain     Final   Value: ABUNDANT WBC PRESENT, PREDOMINANTLY PMN     NO SQUAMOUS EPITHELIAL CELLS SEEN     RARE GRAM POSITIVE COCCI     IN PAIRS IN CHAINS   Colony Count PENDING   Incomplete   Culture PENDING   Incomplete   Report Status PENDING   Incomplete     Studies: Dg Chest 1 View  11/02/2012  *RADIOLOGY REPORT*  Clinical Data: Postoperative hypoxia.  CHEST - 1 VIEW  Comparison: CT chest 11/02/2012 and chest radiograph 11/01/2012.  Findings: Nasogastric tube terminates in the stomach.  Trachea is midline.  Heart size normal.  There is collapse of the right middle and right lower lobes.  Scattered air space disease in the left lung.  No definite left pleural fluid.  IMPRESSION:  1.  Interval right middle and right lower lobe collapse, secondary to a mucous plugging. 2.  Scattered left lung air space disease.   Original Report Authenticated By: Leanna Battles, M.D.    Dg Chest 2 View  11/01/2012  *RADIOLOGY REPORT*  Clinical Data: Preop.  Tobacco use.  CHEST - 2 VIEW  Comparison: 07/04/2009  Findings: Normal heart size. Left upper lobe calcified granuloma. Chronic pleural changes at the lung apices.  No pneumothorax.  No pleural effusion. Left subclavian central venous catheter removed. Mild hyperaeration.  IMPRESSION: No active cardiopulmonary disease.   Original Report Authenticated By: Jolaine Click, M.D.    Ct Angio Chest Pe W/cm &/or Wo Cm  11/02/2012  *RADIOLOGY REPORT*  Clinical Data: acute onset of hypoxia  CT ANGIOGRAPHY CHEST  Technique:  Multidetector CT imaging of the chest using the standard protocol during bolus administration of intravenous contrast. Multiplanar reconstructed images including MIPs were obtained and  reviewed to evaluate the vascular anatomy.  Contrast: OMNIPAQUE IOHEXOL 350 MG/ML SOLN the  Comparison: None  Findings: Lungs/pleura: There is no pleural effusion.  There is complete collapse / consolidation of the right lower lobe and right middle lobe. There is occlusion of the airway to the right middle lobe and right lower lobe.  Patchy areas of ground-glass attenuation are identified within the basilar portion of the right upper lobe and scattered throughout the left lung.  Small nonspecific pulmonary nodules are identified bilaterally.  Index nodule within the left lower lobe measures 3 mm, image 56.  Within the right upper lobe there is a 3 mm nodule, image number 36. Calcified granuloma in the superior segment left lower lobe identified.  Heart/Mediastinum: Main pulmonary artery and its branches are patent.  No evidence for acute pulmonary embolus.Normal heart size. No pericardial effusion.  Prominent mediastinal lymph nodes are identified.  Index right paratracheal lymph node measures 1.3 cm, image 34.  There is a prevascular lymph node which measures 9 mm, image 31. No enlarged axillary or supraclavicular lymph nodes identified.  Thyroid gland appears normal.  Upper abdomen: Mild diffuse low attenuation throughout the liver parenchyma noted.  Bones/Musculoskeletal:  Review of the visualized osseous structures is significant for mild thoracic spondylosis.  IMPRESSION:  1.  No evidence for acute pulmonary embolus. 2. Occlusion of the airway to the right middle lobe and right lower lobe with complete collapse of these areas.  Findings may be due to aspiration and/or mucous plugging.  This is a new finding since 10/31/2012. 3.  Scattered nonspecific pulmonary  nodules.If the patient is at high risk for bronchogenic carcinoma, follow-up chest CT at 1 year is recommended.  If the patient is at low risk, no follow-up is needed.  This recommendation follows the consensus statement: Guidelines for Management  of Small Pulmonary Nodules Detected on CT Scans:  A Statement from the Fleischner Society as published in Radiology 2005; 237:395-400.  4.  Prior granulomatous disease 5.  Hepatic steatosis.   Original Report Authenticated By: Signa Kell, M.D.    Dg Chest Port 1 View  11/03/2012  *RADIOLOGY REPORT*  Clinical Data: Mucous plugging  PORTABLE CHEST - 1 VIEW  Comparison: Chest radiograph 11/02/2012  Findings: Endotracheal tube unchanged.  There is significant improvement in atelectasis at the right lung base.  There is increased perihilar air space disease compared to prior.  Low lung volumes.  No pneumothorax.  IMPRESSION:  1.  Significant improvement in right lower lobe atelectasis. 2.  New bilateral air space disease consistent with pulmonary edema versus infiltrates or potentially aspiration pneumonitis.   Original Report Authenticated By: Genevive Bi, M.D.     Scheduled Meds: . ipratropium  0.5 mg Nebulization Q4H   And  . albuterol  2.5 mg Nebulization Q4H  . budesonide (PULMICORT) nebulizer solution  0.5 mg Nebulization BID  . enoxaparin  40 mg Subcutaneous Q24H  . furosemide  40 mg Intravenous Once  . insulin aspart  0-9 Units Subcutaneous Q6H  . levofloxacin (LEVAQUIN) IV  750 mg Intravenous Q24H  . pantoprazole (PROTONIX) IV  40 mg Intravenous Q24H   Continuous Infusions: . sodium chloride 10 mL/hr at 11/02/12 0900    Principal Problem:   Acute exacerbation of COPD with asthma Active Problems:   Acute respiratory failure with hypoxia   Parastomal hernia   Fibromyalgia   Generalized anxiety disorder   Restless legs syndrome   Obesity (BMI 35.0-39.9 without comorbidity)   Leukocytosis, unspecified   Hyperglycemia   Cigarette nicotine dependence with withdrawal   Complete atelectasis    Time spent: 30 min    Otniel Hoe  [facility] Pager [phone]. If 7PM-7AM, please contact night-coverage at [email or web address], password [facility] 11/03/2012, 8:05 AM  LOS: 3  days

## 2012-11-03 NOTE — Progress Notes (Signed)
eLink Physician-Brief Progress Note Patient Name: [name] DOB: Feb 07, 1961 MRN: [id number]  Date of Service  11/03/2012   HPI/Events of Note     eICU Interventions  PRN zofran ordered for nausea   Intervention Category Minor Interventions: Routine modifications to care plan (e.g. PRN medications for pain, fever)  Icyss Skog 11/03/2012, 2:46 AM

## 2012-11-03 NOTE — Consult Note (Signed)
PULMONARY  / CRITICAL CARE MEDICINE  Name: [name] MRN: [id number] DOB: August 30, 1961    ADMISSION DATE:  10/31/2012 CONSULTATION DATE:  11/02/2012  REFERRING MD :  [facility]S  CHIEF COMPLAINT:  Short of breath  BRIEF PATIENT DESCRIPTION:  52 yo smoker admitted with abdominal pain, nausea/vomiting from partial SBP 2nd to chronic parastomal hernia, and had repair 2/12.  Transfer to SDU 2/13 due to lethargy, hypoxia and found to have mucus plugging of Rt middle and Rt lower lobes, and PCCM consulted.  SIGNIFICANT EVENTS / STUDIES:  2/12 repair of hernia and SBO 2/13 transfer to SDU, hypoxia, Bronchoscopy 2/14 Add Abx  LINES / TUBES: None  CULTURES: Urine 2/11 >> negative BAL 2/13 >> GPC cocci/chains >>  ANTIBIOTICS: Cefoxitin 2/12 >> 2/12  Vancomycin 2/14 >> Zosyn 2/14 >>   SUBJECTIVE:  Breathing okay.  Still sore in abdomen.  Has some cough, but still trouble expectorating.  VITAL SIGNS: Temp:  [97.5 F (36.4 C)-100.2 F (37.9 C)] 97.6 F (36.4 C) (02/14 0400) Pulse Rate:  [91-107] 103 (02/14 0800) Resp:  [17-28] 25 (02/14 0800) BP: (119-143)/(62-81) 119/73 mmHg (02/14 0800) SpO2:  [89 %-100 %] 93 % (02/14 0845) FiO2 (%):  [40 %-50 %] 40 % (02/14 0845) HEMODYNAMICS:   VENTILATOR SETTINGS: Vent Mode:  [-]  FiO2 (%):  [40 %-50 %] 40 % INTAKE / OUTPUT: Intake/Output     02/13 0701 - 02/14 0700 02/14 0701 - 02/15 0700   I.V. (mL/kg) 590.2 (5.7) 10 (0.1)   NG/GT     IV Piggyback 300    Total Intake(mL/kg) 890.2 (8.5) 10 (0.1)   Urine (mL/kg/hr) 250 (0.1) 250 (1.1)   Emesis/NG output 675 (0.3)    Total Output 925 250   Net -34.8 -240        Urine Occurrence 5 x      PHYSICAL EXAMINATION: General:  Ill appearing, speaks in full sentences Neuro:  Alert, follows commands, normal strength HEENT:  No sinus tenderness, MP 3 airway Cardiovascular:  s1s2 regular, no murmur Lungs: b/l rhonchi, no wheeze, better air entry Rt base Abdomen:  Wound vac and LLQ  colostomy in place Musculoskeletal:  No edema Skin:  No rashes  LABS:  Recent Labs Lab 10/31/12 0545 10/31/12 1322 11/01/12 0439 11/02/12 0428 11/02/12 1850 11/02/12 2050 11/02/12 2358 11/03/12 0337  HGB 15.7*  --  13.8 15.0  --   --   --  14.6  WBC 10.3  --  5.8 19.1*  --   --   --  17.6*  PLT 266  --  235 251  --   --   --  223  NA 137  --  138 135  --   --   --  138  K 3.5  --  4.1 3.8  --   --   --  3.7  CL 102  --  108 101  --   --   --  101  CO2 23  --  25 25  --   --   --  30  GLUCOSE 128*  --  127* 169*  --   --   --  138*  BUN 20  --  9 8  --   --   --  9  CREATININE 0.71  --  0.71 0.58  --   --   --  0.66  CALCIUM 9.0  --  8.5 8.7  --   --   --  8.8  MG  --   --  1.8  --   --   --   --  1.7  AST 12  --   --   --   --   --   --   --   ALT <5  --   --   --   --   --   --   --   ALKPHOS 90  --   --   --   --   --   --   --   BILITOT 0.2*  --   --   --   --   --   --   --   PROT 7.3  --   --   --   --   --   --   --   ALBUMIN 3.6  --   --   --   --   --   --   --   APTT  --  33  --   --   --   --   --   --   INR  --  1.01  --   --   --   --   --   --   TROPONINI  --   --   --   --  <0.30 <0.30 <0.30  --     Recent Labs Lab 11/02/12 1155 11/02/12 1814 11/02/12 2345 11/03/12 0542  GLUCAP 121* 117* 128* 139*    Imaging: Dg Chest 1 View  11/02/2012  *RADIOLOGY REPORT*  Clinical Data: Postoperative hypoxia.  CHEST - 1 VIEW  Comparison: CT chest 11/02/2012 and chest radiograph 11/01/2012.  Findings: Nasogastric tube terminates in the stomach.  Trachea is midline.  Heart size normal.  There is collapse of the right middle and right lower lobes.  Scattered air space disease in the left lung.  No definite left pleural fluid.  IMPRESSION:  1.  Interval right middle and right lower lobe collapse, secondary to a mucous plugging. 2.  Scattered left lung air space disease.   Original Report Authenticated By: Leanna Battles, M.D.    Ct Angio Chest Pe W/cm &/or Wo  Cm  11/02/2012  *RADIOLOGY REPORT*  Clinical Data: acute onset of hypoxia  CT ANGIOGRAPHY CHEST  Technique:  Multidetector CT imaging of the chest using the standard protocol during bolus administration of intravenous contrast. Multiplanar reconstructed images including MIPs were obtained and reviewed to evaluate the vascular anatomy.  Contrast: OMNIPAQUE IOHEXOL 350 MG/ML SOLN the  Comparison: None  Findings: Lungs/pleura: There is no pleural effusion.  There is complete collapse / consolidation of the right lower lobe and right middle lobe. There is occlusion of the airway to the right middle lobe and right lower lobe.  Patchy areas of ground-glass attenuation are identified within the basilar portion of the right upper lobe and scattered throughout the left lung.  Small nonspecific pulmonary nodules are identified bilaterally.  Index nodule within the left lower lobe measures 3 mm, image 56.  Within the right upper lobe there is a 3 mm nodule, image number 36. Calcified granuloma in the superior segment left lower lobe identified.  Heart/Mediastinum: Main pulmonary artery and its branches are patent.  No evidence for acute pulmonary embolus.Normal heart size. No pericardial effusion.  Prominent mediastinal lymph nodes are identified.  Index right paratracheal lymph node measures 1.3 cm, image 34.  There is a prevascular lymph node which measures 9 mm, image 31. No enlarged axillary  or supraclavicular lymph nodes identified.  Thyroid gland appears normal.  Upper abdomen: Mild diffuse low attenuation throughout the liver parenchyma noted.  Bones/Musculoskeletal:  Review of the visualized osseous structures is significant for mild thoracic spondylosis.  IMPRESSION:  1.  No evidence for acute pulmonary embolus. 2. Occlusion of the airway to the right middle lobe and right lower lobe with complete collapse of these areas.  Findings may be due to aspiration and/or mucous plugging.  This is a new finding since  10/31/2012. 3.  Scattered nonspecific pulmonary nodules.If the patient is at high risk for bronchogenic carcinoma, follow-up chest CT at 1 year is recommended.  If the patient is at low risk, no follow-up is needed.  This recommendation follows the consensus statement: Guidelines for Management of Small Pulmonary Nodules Detected on CT Scans:  A Statement from the Fleischner Society as published in Radiology 2005; 237:395-400.  4.  Prior granulomatous disease 5.  Hepatic steatosis.   Original Report Authenticated By: Signa Kell, M.D.    Dg Chest Port 1 View  11/03/2012  *RADIOLOGY REPORT*  Clinical Data: Mucous plugging  PORTABLE CHEST - 1 VIEW  Comparison: Chest radiograph 11/02/2012  Findings: Endotracheal tube unchanged.  There is significant improvement in atelectasis at the right lung base.  There is increased perihilar air space disease compared to prior.  Low lung volumes.  No pneumothorax.  IMPRESSION:  1.  Significant improvement in right lower lobe atelectasis. 2.  New bilateral air space disease consistent with pulmonary edema versus infiltrates or potentially aspiration pneumonitis.   Original Report Authenticated By: Genevive Bi, M.D.      ASSESSMENT / PLAN:  A: Acute hypoxic respiratory failure 2nd to mucus plugging >> develop b/l pulmonary infiltrates 2/14 concerning for aspiration. Hx of asthma and tobacco abuse. P:   Oxygen to keep SpO2 > 92% Agree with lasix x 1 on 2/14 to keep in negative fluid balance Agree with addition of vancomycin/zosyn pending BAL results Bronchial hygiene with flutter valve Continue albuterol/ipratopium/pulmicort F/u CXR  A:  Partial SBO s/p repair. Hx of GERD. P:   Post-op care, nutrition per [facility]  A:  Lethargy likely related to narcotics and hypoxia >> improved 2/14. Hx of RLS, fibromyalgia. P:  Limit narcotics Monitor mental status  PCCM will continue to assist with medical and pulmonary management while pt in SDU.  Coralyn Helling,  MD [facility] Pulmonary/Critical Care 11/03/2012, 9:16 AM Pager:  [phone] After 3pm call: [phone]

## 2012-11-03 NOTE — Progress Notes (Signed)
[id number] Christina Plater, RN, BSN, CCM:  CHART REVIEWED AND UPDATED.  Next chart review due on [id number]. NO DISCHARGE NEEDS PRESENT AT THIS TIME. CASE MANAGEMENT [phone]

## 2012-11-03 NOTE — Progress Notes (Signed)
2 Days Post-Op  Subjective: Wheezing and rales on pulmonary exam. She isn't coughing up anything and sounds congested.  Objective: Vital signs in last 24 hours: Temp:  [97.5 F (36.4 C)-100.2 F (37.9 C)] 97.6 F (36.4 C) (02/14 0400) Pulse Rate:  [91-107] 104 (02/14 0600) Resp:  [17-28] 27 (02/14 0600) BP: (120-143)/(62-81) 124/66 mmHg (02/14 0600) SpO2:  [89 %-100 %] 98 % (02/14 0600) FiO2 (%):  [50 %] 50 % (02/14 0600) Last BM Date:  (Colostomy) 675 per NG, TM100.2, Back on the Venturi mask early this AM, BP stable, WBC is still up  Intake/Output from previous day: 02/13 0701 - 02/14 0700 In: 890.2 [I.V.:590.2; IV Piggyback:300] Out: 925 [Urine:250; Emesis/NG output:675] Intake/Output this shift:    General appearance: alert, no distress and congested doesn't want to move much. Resp: Bilat rales and wheezing GI: soft, no bowel sounds nothing in the ostomy bag, wound vac in place  Lab Results:   Recent Labs  11/02/12 0428 11/03/12 0337  WBC 19.1* 17.6*  HGB 15.0 14.6  HCT 45.1 43.4  PLT 251 223    BMET  Recent Labs  11/02/12 0428 11/03/12 0337  NA 135 138  K 3.8 3.7  CL 101 101  CO2 25 30  GLUCOSE 169* 138*  BUN 8 9  CREATININE 0.58 0.66  CALCIUM 8.7 8.8   PT/INR  Recent Labs  10/31/12 1322  LABPROT 13.2  INR 1.01     Recent Labs Lab 10/31/12 0545  AST 12  ALT <5  ALKPHOS 90  BILITOT 0.2*  PROT 7.3  ALBUMIN 3.6     Lipase     Component Value Date/Time   LIPASE 38 10/31/2012 0545     Studies/Results: Dg Chest 1 View  11/02/2012  *RADIOLOGY REPORT*  Clinical Data: Postoperative hypoxia.  CHEST - 1 VIEW  Comparison: CT chest 11/02/2012 and chest radiograph 11/01/2012.  Findings: Nasogastric tube terminates in the stomach.  Trachea is midline.  Heart size normal.  There is collapse of the right middle and right lower lobes.  Scattered air space disease in the left lung.  No definite left pleural fluid.  IMPRESSION:  1.  Interval right  middle and right lower lobe collapse, secondary to a mucous plugging. 2.  Scattered left lung air space disease.   Original Report Authenticated By: Leanna Battles, M.D.    Dg Chest 2 View  11/01/2012  *RADIOLOGY REPORT*  Clinical Data: Preop.  Tobacco use.  CHEST - 2 VIEW  Comparison: 07/04/2009  Findings: Normal heart size. Left upper lobe calcified granuloma. Chronic pleural changes at the lung apices.  No pneumothorax.  No pleural effusion. Left subclavian central venous catheter removed. Mild hyperaeration.  IMPRESSION: No active cardiopulmonary disease.   Original Report Authenticated By: Jolaine Click, M.D.    Ct Angio Chest Pe W/cm &/or Wo Cm  11/02/2012  *RADIOLOGY REPORT*  Clinical Data: acute onset of hypoxia  CT ANGIOGRAPHY CHEST  Technique:  Multidetector CT imaging of the chest using the standard protocol during bolus administration of intravenous contrast. Multiplanar reconstructed images including MIPs were obtained and reviewed to evaluate the vascular anatomy.  Contrast: OMNIPAQUE IOHEXOL 350 MG/ML SOLN the  Comparison: None  Findings: Lungs/pleura: There is no pleural effusion.  There is complete collapse / consolidation of the right lower lobe and right middle lobe. There is occlusion of the airway to the right middle lobe and right lower lobe.  Patchy areas of ground-glass attenuation are identified within the basilar portion  of the right upper lobe and scattered throughout the left lung.  Small nonspecific pulmonary nodules are identified bilaterally.  Index nodule within the left lower lobe measures 3 mm, image 56.  Within the right upper lobe there is a 3 mm nodule, image number 36. Calcified granuloma in the superior segment left lower lobe identified.  Heart/Mediastinum: Main pulmonary artery and its branches are patent.  No evidence for acute pulmonary embolus.Normal heart size. No pericardial effusion.  Prominent mediastinal lymph nodes are identified.  Index right paratracheal  lymph node measures 1.3 cm, image 34.  There is a prevascular lymph node which measures 9 mm, image 31. No enlarged axillary or supraclavicular lymph nodes identified.  Thyroid gland appears normal.  Upper abdomen: Mild diffuse low attenuation throughout the liver parenchyma noted.  Bones/Musculoskeletal:  Review of the visualized osseous structures is significant for mild thoracic spondylosis.  IMPRESSION:  1.  No evidence for acute pulmonary embolus. 2. Occlusion of the airway to the right middle lobe and right lower lobe with complete collapse of these areas.  Findings may be due to aspiration and/or mucous plugging.  This is a new finding since 10/31/2012. 3.  Scattered nonspecific pulmonary nodules.If the patient is at high risk for bronchogenic carcinoma, follow-up chest CT at 1 year is recommended.  If the patient is at low risk, no follow-up is needed.  This recommendation follows the consensus statement: Guidelines for Management of Small Pulmonary Nodules Detected on CT Scans:  A Statement from the Fleischner Society as published in Radiology 2005; 237:395-400.  4.  Prior granulomatous disease 5.  Hepatic steatosis.   Original Report Authenticated By: Signa Kell, M.D.    Dg Chest Port 1 View  11/03/2012  *RADIOLOGY REPORT*  Clinical Data: Mucous plugging  PORTABLE CHEST - 1 VIEW  Comparison: Chest radiograph 11/02/2012  Findings: Endotracheal tube unchanged.  There is significant improvement in atelectasis at the right lung base.  There is increased perihilar air space disease compared to prior.  Low lung volumes.  No pneumothorax.  IMPRESSION:  1.  Significant improvement in right lower lobe atelectasis. 2.  New bilateral air space disease consistent with pulmonary edema versus infiltrates or potentially aspiration pneumonitis.   Original Report Authenticated By: Genevive Bi, M.D.     Medications: . ipratropium  0.5 mg Nebulization Q4H   And  . albuterol  2.5 mg Nebulization Q4H  .  budesonide (PULMICORT) nebulizer solution  0.5 mg Nebulization BID  . enoxaparin  40 mg Subcutaneous Q24H  . furosemide  40 mg Intravenous Once  . insulin aspart  0-9 Units Subcutaneous Q6H  . pantoprazole (PROTONIX) IV  40 mg Intravenous Q24H   . sodium chloride 10 mL/hr at 11/02/12 0900   Assessment/Plan 1. 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 2. Incisional hernia. 3. Dense intra-abdominal adhesions.  S/P 1. Exploratory laparotomy with closure of parastomal hernia primarily.  2. Extensive lysis of adhesions taking 1 hour.  3. Repair of multiple small incisional hernia, which was primary.  Post op hypoxia with mucus plugging RML AND RLL- Bronchoscopy by DR. SOOD 11/02/12 CXR Shows new infiltrates possible pulmonary edema vs aspiration pnemonia, bilateral. Medicine is following with [place] for this 1. 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 Claud Kelp.  2. 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  3. Nonhealing skin ulcer left lower  quadrant.  4. Fibromyalgia  5. Anxiety  6. Restless leg syndrome  7. Ongoing tobacco  8. History of nephrolithiasis  9. BMI of approximately 38.  Plan: Medicine is seeing her with [place] for her hypoxia, but with current CXR I will start some Vancomycin and Zosyn.  Medicine can adjust.  Lasix has already been ordered by Dr. Malachi Bonds.  Get her up to chair and work on pulmonary toilet  LOS: 3 days    [name] 11/03/2012

## 2012-11-03 NOTE — Progress Notes (Signed)
ANTIBIOTIC CONSULT NOTE - INITIAL  Pharmacy Consult for Vancomycin, Zosyn Indication: rule out pneumonia  No Known Allergies  Patient Measurements: Height: 5\' 9"  (175.3 cm) Weight: 230 lb (104.327 kg) IBW/kg (Calculated) : 66.2  Vital Signs: Temp: 97.6 F (36.4 C) (02/14 0400) Temp src: Oral (02/14 0400) BP: 124/66 mmHg (02/14 0600) Pulse Rate: 104 (02/14 0600) Intake/Output from previous day: 02/13 0701 - 02/14 0700 In: 890.2 [I.V.:590.2; IV Piggyback:300] Out: 925 [Urine:250; Emesis/NG output:675] Intake/Output from this shift:    Labs:  Recent Labs  11/01/12 0439 11/02/12 0428 11/03/12 0337  WBC 5.8 19.1* 17.6*  HGB 13.8 15.0 14.6  PLT 235 251 223  CREATININE 0.71 0.58 0.66   Estimated Creatinine Clearance: 104.5 ml/min (by C-G formula based on Cr of 0.66). No results found for this basename: VANCOTROUGH, Leodis Binet, VANCORANDOM, GENTTROUGH, GENTPEAK, GENTRANDOM, TOBRATROUGH, TOBRAPEAK, TOBRARND, AMIKACINPEAK, AMIKACINTROU, AMIKACIN,  in the last 72 hours   Microbiology: Recent Results (from the past 720 hour(s))  URINE CULTURE     Status: None   Collection Time    10/31/12  7:20 AM      Result Value Range Status   Specimen Description URINE, CLEAN CATCH   Final   Special Requests NONE   Final   Culture  Setup Time 10/31/2012 15:12   Final   Colony Count NO GROWTH   Final   Culture NO GROWTH   Final   Report Status 11/01/2012 FINAL   Final  SURGICAL PCR SCREEN     Status: None   Collection Time    11/01/12  5:22 AM      Result Value Range Status   MRSA, PCR NEGATIVE  NEGATIVE Final   Staphylococcus aureus NEGATIVE  NEGATIVE Final   Comment:            The Xpert SA Assay (FDA     approved for NASAL specimens     in patients over 54 years of age),     is one component of     a comprehensive surveillance     program.  Test performance has     been validated by [employer] for patients greater     than or equal to 67 year old.     It is not  intended     to diagnose infection nor to     guide or monitor treatment.    Medical History: Past Medical History  Diagnosis Date  . Diverticulitis   . Fibromyalgia   . Restless leg syndrome     Bilateral  . Asthma   . Bronchitis, chronic     Effects worse with URI  . Kidney stones     at least 6 stones in past  . Headache     migraine headache  . Acid reflux disease   . Anxiety     Medications:  Scheduled:  . [COMPLETED] acetaminophen  1,000 mg Intravenous Q6H  . ipratropium  0.5 mg Nebulization Q4H   And  . albuterol  2.5 mg Nebulization Q4H  . budesonide (PULMICORT) nebulizer solution  0.5 mg Nebulization BID  . [EXPIRED] cefOXitin  1 g Intravenous Q6H  . enoxaparin  40 mg Subcutaneous Q24H  . [COMPLETED] fentaNYL      . [COMPLETED] furosemide  20 mg Intravenous Once  . furosemide  40 mg Intravenous Once  . [EXPIRED] influenza  inactive virus vaccine  0.5 mL Intramuscular Tomorrow-1000  . insulin aspart  0-9 Units Subcutaneous Q6H  .  levofloxacin (LEVAQUIN) IV  750 mg Intravenous Q24H  . [EXPIRED] midazolam      . [COMPLETED] midazolam      . pantoprazole (PROTONIX) IV  40 mg Intravenous Q24H  . [DISCONTINUED] HYDROmorphone PCA 0.3 mg/mL   Intravenous Q4H  . [DISCONTINUED] HYDROmorphone PCA 0.3 mg/mL   Intravenous Q4H  . [DISCONTINUED] HYDROmorphone PCA 0.3 mg/mL   Intravenous Q4H   Assessment: 52 yoF admitted for SBO, 2/12 underwent expl Lap with LOA, hernia repair. Hx of diverticulitis, COPD.  Aspiration PNA vs atalectasis or pulm edema.  Begin Vancomycin and Zosyn  Clearance wnl, pt with large body habitus.  Goal of Therapy:  Vancomycin trough level 15-20 mcg/ml  Plan:  Vancomycin 2gm x 1 dose, then 1gm q12 Zosyn 3.375 gm q8h, 4 hr infusion Trough at steady state if needed.  Otho Bellows PharmD Pager [phone] 11/03/2012,7:49 AM

## 2012-11-04 ENCOUNTER — Inpatient Hospital Stay (HOSPITAL_COMMUNITY): Payer: 59

## 2012-11-04 DIAGNOSIS — N179 Acute kidney failure, unspecified: Secondary | ICD-10-CM | POA: Diagnosis not present

## 2012-11-04 LAB — BASIC METABOLIC PANEL
BUN: 17 mg/dL (ref 6–23)
BUN: 23 mg/dL (ref 6–23)
CO2: 30 mEq/L (ref 19–32)
CO2: 30 mEq/L (ref 19–32)
Calcium: 9 mg/dL (ref 8.4–10.5)
Calcium: 9 mg/dL (ref 8.4–10.5)
Chloride: 101 mEq/L (ref 96–112)
Chloride: 99 mEq/L (ref 96–112)
Creatinine, Ser: 1.4 mg/dL — ABNORMAL HIGH (ref 0.50–1.10)
Creatinine, Ser: 1.65 mg/dL — ABNORMAL HIGH (ref 0.50–1.10)
GFR calc Af Amer: 49 mL/min — ABNORMAL LOW (ref >90)
GFR calc Af Amer: 40 mL/min — ABNORMAL LOW (ref >90)
GFR calc non Af Amer: 42 mL/min — ABNORMAL LOW (ref >90)
GFR calc non Af Amer: 34 mL/min — ABNORMAL LOW (ref >90)
Glucose, Bld: 101 mg/dL — ABNORMAL HIGH (ref 70–99)
Glucose, Bld: 117 mg/dL — ABNORMAL HIGH (ref 70–99)
Potassium: 3.2 mEq/L — ABNORMAL LOW (ref 3.5–5.1)
Potassium: 3.4 mEq/L — ABNORMAL LOW (ref 3.5–5.1)
Sodium: 141 mEq/L (ref 135–145)
Sodium: 140 mEq/L (ref 135–145)

## 2012-11-04 LAB — GLUCOSE, CAPILLARY
Glucose-Capillary: 102 mg/dL — ABNORMAL HIGH (ref 70–99)
Glucose-Capillary: 106 mg/dL — ABNORMAL HIGH (ref 70–99)
Glucose-Capillary: 87 mg/dL (ref 70–99)
Glucose-Capillary: 97 mg/dL (ref 70–99)

## 2012-11-04 LAB — CBC
HCT: 41.7 % (ref 36.0–46.0)
Hemoglobin: 13.8 g/dL (ref 12.0–15.0)
MCH: 29.9 pg (ref 26.0–34.0)
MCHC: 33.1 g/dL (ref 30.0–36.0)
MCV: 90.5 fL (ref 78.0–100.0)
Platelets: 230 10*3/uL (ref 150–400)
RBC: 4.61 MIL/uL (ref 3.87–5.11)
RDW: 13.5 % (ref 11.5–15.5)
WBC: 12.8 10*3/uL — ABNORMAL HIGH (ref 4.0–10.5)

## 2012-11-04 LAB — MAGNESIUM: Magnesium: 1.8 mg/dL (ref 1.5–2.5)

## 2012-11-04 MED ORDER — FENTANYL CITRATE 0.05 MG/ML IJ SOLN
50.0000 ug | Freq: Once | INTRAMUSCULAR | Status: AC
  Administered 2012-11-04: 50 ug via INTRAVENOUS

## 2012-11-04 MED ORDER — FENTANYL CITRATE 0.05 MG/ML IJ SOLN
INTRAMUSCULAR | Status: AC
  Filled 2012-11-04: qty 2

## 2012-11-04 MED ORDER — MIDAZOLAM HCL 5 MG/ML IJ SOLN
2.0000 mg | Freq: Once | INTRAMUSCULAR | Status: AC
  Administered 2012-11-04: 2 mg via INTRAVENOUS

## 2012-11-04 MED ORDER — SODIUM CHLORIDE 0.9 % IV BOLUS (SEPSIS)
1000.0000 mL | Freq: Once | INTRAVENOUS | Status: AC
  Administered 2012-11-04: 1000 mL via INTRAVENOUS

## 2012-11-04 MED ORDER — SODIUM CHLORIDE 0.9 % IV SOLN
INTRAVENOUS | Status: DC
  Administered 2012-11-04: 100 mL/h via INTRAVENOUS
  Administered 2012-11-05 – 2012-11-06 (×2): via INTRAVENOUS

## 2012-11-04 MED ORDER — VANCOMYCIN HCL 500 MG IV SOLR
500.0000 mg | Freq: Once | INTRAVENOUS | Status: AC
  Administered 2012-11-04: 500 mg via INTRAVENOUS
  Filled 2012-11-04: qty 500

## 2012-11-04 MED ORDER — POTASSIUM CHLORIDE 10 MEQ/100ML IV SOLN
10.0000 meq | INTRAVENOUS | Status: AC
  Administered 2012-11-04 (×2): 10 meq via INTRAVENOUS
  Filled 2012-11-04: qty 200

## 2012-11-04 MED ORDER — LORAZEPAM 2 MG/ML IJ SOLN
1.0000 mg | Freq: Four times a day (QID) | INTRAMUSCULAR | Status: DC | PRN
  Administered 2012-11-04 – 2012-11-05 (×3): 1 mg via INTRAVENOUS
  Filled 2012-11-04 (×3): qty 1

## 2012-11-04 MED ORDER — MIDAZOLAM HCL 5 MG/ML IJ SOLN
INTRAMUSCULAR | Status: AC
  Filled 2012-11-04: qty 1

## 2012-11-04 MED ORDER — SODIUM CHLORIDE 0.9 % IV SOLN: 1500.0000 mg | INTRAVENOUS | Status: DC

## 2012-11-04 NOTE — Progress Notes (Signed)
Tachycardia post op day 2; acute onset hypoxia today due to mucus plugging;   Repeat CXr stat and am labs now.

## 2012-11-04 NOTE — Progress Notes (Signed)
ANTIBIOTIC CONSULT NOTE - FOLLOW UP  Pharmacy Consult for Vancomycin, Zosyn Indication: r/o pneumonia  Allergies  Allergen Reactions  . Tramadol     Per Dr. Threasa Beards is renal failure    Patient Measurements: Height: 5\' 9"  (175.3 cm) Weight: 230 lb (104.327 kg) IBW/kg (Calculated) : 66.2  Vital Signs: Temp: 98 F (36.7 C) (02/15 1200) Temp src: Oral (02/15 1200) BP: 146/75 mmHg (02/15 1605) Pulse Rate: 99 (02/15 1605) Intake/Output from previous day: 02/14 0701 - 02/15 0700 In: 1382.5 [I.V.:220; NG/GT:50; IV Piggyback:1112.5] Out: 1400 [Urine:1000; Emesis/NG output:400] Intake/Output from this shift: Total I/O In: 1960 [I.V.:1710; IV Piggyback:250] Out: 1075 [Urine:1075]  Labs:  Recent Labs  11/02/12 0428 11/03/12 0337 11/04/12 0210 11/04/12 1550  WBC 19.1* 17.6* 12.8*  --   HGB 15.0 14.6 13.8  --   PLT 251 223 230  --   CREATININE 0.58 0.66 1.40* 1.65*   Estimated Creatinine Clearance: 50.7 ml/min (by C-G formula based on Cr of 1.65). No results found for this basename: Rolm Gala, VANCORANDOM, GENTTROUGH, GENTPEAK, GENTRANDOM, TOBRATROUGH, TOBRAPEAK, TOBRARND, AMIKACINPEAK, AMIKACINTROU, AMIKACIN,  in the last 72 hours    Assessment:  29 yof s/p chronic parastomal hernia, s/p repair 2/12 transferred to SDU 2/13 secondary to lethargy, hypoxia.  Vanc, Zosyn on board given bilateral pulmonary infiltrates concerning for aspiration.    2/11 urine cx negative, 2/13 BAL with > 100K of normal flora (rare GPC in pairs/chains on gram stain). Scr trended up today to 1.4 this AM and 1.65 this afternoon.  CG CG 51, N 45 ml/min.   Tmax 101, WBc down to 12.8K.   Patient was receiving Vanc 1gm IV q12h with last dose at 1300 today.  Given rise in Scr - will adjust Vancomycin dose empirically.  Goal of Therapy:  Vancomycin trough level 15-20 mcg/ml  Plan:   Discontinue Vanc 1g IV q12h  Give another Vancomycin 500 mg IV x 1 now for a total of Vanc  1500 mg IV today  Then Vancomycin 1500 mg IV q24h starting tomorrow at 1800.    Continue Zosyn 3.375 gm VI q8h as ordered  Pharmacy will f/u  Geoffry Paradise, PharmD, BCPS Pager: [phone] 5:42 PM Pharmacy #: [phone]

## 2012-11-04 NOTE — Progress Notes (Signed)
3 Days Post-Op  Subjective: Min UOP overnight, pain controlled, Min NG output (clear)  Objective: Vital signs in last 24 hours: Temp:  [98.3 F (36.8 C)-101 F (38.3 C)] 98.4 F (36.9 C) (02/15 0400) Pulse Rate:  [95-123] 98 (02/15 0400) Resp:  [19-29] 27 (02/15 0400) BP: (105-132)/(60-79) 107/60 mmHg (02/15 0400) SpO2:  [92 %-96 %] 94 % (02/15 0432) FiO2 (%):  [40 %] 40 % (02/14 1000) Last BM Date:  (Colostomy) 675 per NG, TM100.2, Back on the Venturi mask early this AM, BP stable, WBC is still up  Intake/Output from previous day: 02/14 0701 - 02/15 0700 In: 1372.5 [I.V.:210; NG/GT:50; IV Piggyback:1112.5] Out: 1400 [Urine:1000; Emesis/NG output:400] Intake/Output this shift: Total I/O In: -  Out: 325 [Urine:325]  General appearance: alert, no distress and congested doesn't want to move much. Resp: Bilat rales and wheezing GI: soft, no bowel sounds nothing in the ostomy bag, wound vac in place Wound: clean  Lab Results:   Recent Labs  11/03/12 0337 11/04/12 0210  WBC 17.6* 12.8*  HGB 14.6 13.8  HCT 43.4 41.7  PLT 223 230    BMET  Recent Labs  11/03/12 0337 11/04/12 0210  NA 138 141  K 3.7 3.2*  CL 101 101  CO2 30 30  GLUCOSE 138* 101*  BUN 9 17  CREATININE 0.66 1.40*  CALCIUM 8.8 9.0   PT/INR No results found for this basename: LABPROT, INR,  in the last 72 hours   Recent Labs Lab 10/31/12 0545  AST 12  ALT <5  ALKPHOS 90  BILITOT 0.2*  PROT 7.3  ALBUMIN 3.6     Lipase     Component Value Date/Time   LIPASE 38 10/31/2012 0545     Studies/Results: Dg Chest 1 View  11/02/2012  *RADIOLOGY REPORT*  Clinical Data: Postoperative hypoxia.  CHEST - 1 VIEW  Comparison: CT chest 11/02/2012 and chest radiograph 11/01/2012.  Findings: Nasogastric tube terminates in the stomach.  Trachea is midline.  Heart size normal.  There is collapse of the right middle and right lower lobes.  Scattered air space disease in the left lung.  No definite left  pleural fluid.  IMPRESSION:  1.  Interval right middle and right lower lobe collapse, secondary to a mucous plugging. 2.  Scattered left lung air space disease.   Original Report Authenticated By: Leanna Battles, M.D.    Ct Angio Chest Pe W/cm &/or Wo Cm  11/02/2012  *RADIOLOGY REPORT*  Clinical Data: acute onset of hypoxia  CT ANGIOGRAPHY CHEST  Technique:  Multidetector CT imaging of the chest using the standard protocol during bolus administration of intravenous contrast. Multiplanar reconstructed images including MIPs were obtained and reviewed to evaluate the vascular anatomy.  Contrast: OMNIPAQUE IOHEXOL 350 MG/ML SOLN the  Comparison: None  Findings: Lungs/pleura: There is no pleural effusion.  There is complete collapse / consolidation of the right lower lobe and right middle lobe. There is occlusion of the airway to the right middle lobe and right lower lobe.  Patchy areas of ground-glass attenuation are identified within the basilar portion of the right upper lobe and scattered throughout the left lung.  Small nonspecific pulmonary nodules are identified bilaterally.  Index nodule within the left lower lobe measures 3 mm, image 56.  Within the right upper lobe there is a 3 mm nodule, image number 36. Calcified granuloma in the superior segment left lower lobe identified.  Heart/Mediastinum: Main pulmonary artery and its branches are patent.  No evidence  for acute pulmonary embolus.Normal heart size. No pericardial effusion.  Prominent mediastinal lymph nodes are identified.  Index right paratracheal lymph node measures 1.3 cm, image 34.  There is a prevascular lymph node which measures 9 mm, image 31. No enlarged axillary or supraclavicular lymph nodes identified.  Thyroid gland appears normal.  Upper abdomen: Mild diffuse low attenuation throughout the liver parenchyma noted.  Bones/Musculoskeletal:  Review of the visualized osseous structures is significant for mild thoracic spondylosis.   IMPRESSION:  1.  No evidence for acute pulmonary embolus. 2. Occlusion of the airway to the right middle lobe and right lower lobe with complete collapse of these areas.  Findings may be due to aspiration and/or mucous plugging.  This is a new finding since 10/31/2012. 3.  Scattered nonspecific pulmonary nodules.If the patient is at high risk for bronchogenic carcinoma, follow-up chest CT at 1 year is recommended.  If the patient is at low risk, no follow-up is needed.  This recommendation follows the consensus statement: Guidelines for Management of Small Pulmonary Nodules Detected on CT Scans:  A Statement from the Fleischner Society as published in Radiology 2005; 237:395-400.  4.  Prior granulomatous disease 5.  Hepatic steatosis.   Original Report Authenticated By: Signa Kell, M.D.    Dg Chest Port 1 View  11/04/2012  *RADIOLOGY REPORT*  Clinical Data: Follow up respiratory failure.  PORTABLE CHEST - 1 VIEW  Comparison: Chest radiograph performed 11/03/2012  Findings: The lungs are hypoexpanded.  Persistent bilateral airspace opacities are noted, perhaps slightly improved from the prior study.  Underlying vascular congestion is again seen.  No definite pleural effusion or pneumothorax is identified.  The cardiomediastinal silhouette is borderline normal in size.  The patient's enteric tube is noted ending at the body of the stomach, with the sideport noted about the gastroesophageal junction.  No acute osseous abnormalities are seen.  IMPRESSION:  1.  Lungs hypoexpanded.  Persistent bilateral airspace opacities, perhaps slightly improved from the prior study, with underlying vascular congestion.  This may reflect either pulmonary edema or pneumonia. 2.  Enteric tube noted ending at the body of the stomach, with the sideport noted about the gastroesophageal junction.   Original Report Authenticated By: Tonia Ghent, M.D.    Dg Chest Port 1 View  11/03/2012  *RADIOLOGY REPORT*  Clinical Data: Mucous  plugging  PORTABLE CHEST - 1 VIEW  Comparison: Chest radiograph 11/02/2012  Findings: Endotracheal tube unchanged.  There is significant improvement in atelectasis at the right lung base.  There is increased perihilar air space disease compared to prior.  Low lung volumes.  No pneumothorax.  IMPRESSION:  1.  Significant improvement in right lower lobe atelectasis. 2.  New bilateral air space disease consistent with pulmonary edema versus infiltrates or potentially aspiration pneumonitis.   Original Report Authenticated By: Genevive Bi, M.D.     Medications: . ipratropium  0.5 mg Nebulization Q4H   And  . albuterol  2.5 mg Nebulization Q4H  . antiseptic oral rinse  15 mL Mouth Rinse q12n4p  . budesonide (PULMICORT) nebulizer solution  0.5 mg Nebulization BID  . chlorhexidine  15 mL Mouth Rinse BID  . enoxaparin  40 mg Subcutaneous Q24H  . fentaNYL      . fentaNYL  50 mcg Intravenous Once  . insulin aspart  0-9 Units Subcutaneous Q6H  . midazolam      . midazolam  2 mg Intravenous Once  . pantoprazole (PROTONIX) IV  40 mg Intravenous Q24H  . piperacillin-tazobactam (ZOSYN)  IV  3.375 g Intravenous Q8H  . sodium chloride  1,000 mL Intravenous Once  . vancomycin  1,000 mg Intravenous Q12H   . sodium chloride 10 mL/hr at 11/02/12 0900  . sodium chloride     Assessment/Plan 1. 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 2. Incisional hernia. 3. Dense intra-abdominal adhesions.  S/P 1. Exploratory laparotomy with closure of parastomal hernia primarily.  2. Extensive lysis of adhesions taking 1 hour.  3. Repair of multiple small incisional hernia, which was primary.  Post op hypoxia with mucus plugging RML AND RLL- Bronchoscopy by DR. SOOD 11/02/12 CXR Shows new infiltrates possible pulmonary edema vs aspiration pnemonia, bilateral. Medicine is following with [place] for this 1. History of diverticulitis with a left  colectomy and colostomy 07/04/09 Dr. Claud Kelp.  2. 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  3. Nonhealing skin ulcer left lower quadrant.  4. Fibromyalgia  5. Anxiety  6. Restless leg syndrome  7. Ongoing tobacco  8. History of nephrolithiasis  9. BMI of approximately 38.  Plan: Post op ileus- d/c NG, cont NPO, await return of bowel function Pt appears to be dehydrated, min UOP, Cr elevated- pt refuses foley replacement, agree with bolus from CCM, hold lasix today Wound vac changed with sedation- pt did very well, can probably be changed by wound RN with premedication on Tues   LOS: 4 days    Grace Haggart C. 11/04/2012

## 2012-11-04 NOTE — Progress Notes (Signed)
PULMONARY  / CRITICAL CARE MEDICINE  Name: [name] MRN: [id number] DOB: [birth year]    ADMISSION DATE:  10/31/2012 CONSULTATION DATE:  11/02/2012  REFERRING MD :  [facility]  CHIEF COMPLAINT:  Short of breath  BRIEF PATIENT DESCRIPTION:  52 yo smoker admitted with abdominal pain, nausea/vomiting from partial SBP 2nd to chronic parastomal hernia, and had repair 2/12.  Transfer to SDU 2/13 due to lethargy, hypoxia and found to have mucus plugging of Rt middle and Rt lower lobes, and PCCM consulted.  SIGNIFICANT EVENTS / STUDIES:  2/12 repair of hernia and SBO 2/13 transfer to SDU, hypoxia, Bronchoscopy 2/14 Add Abx  LINES / TUBES: None  CULTURES: Urine 2/11 >> negative BAL 2/13 >> GPC cocci/chains >> MRSA PCR and MSSA PCR 10/31/2012 > negative    ANTIBIOTICS: Cefoxitin 2/12 >> 2/12  Vancomycin 2/14 >> Zosyn 2/14 >>   EVENTS 11/04/11: Breathing okay.  Still sore in abdomen.  Has some cough, but still trouble expectorating.    SUBJECTIVE/OVERNIGHT/INTERVAL HX 11/04/2012: Received 2 doses of Toradol past 24 hours. Past 12 hours no urine output and has rising creatinine 1.4 mg percent. She complains of some orthostatic dizziness and some increased thirst. Had one episode of fever last night but resolved. No respiratory issues  VITAL SIGNS: Temp:  [98.3 F (36.8 C)-101 F (38.3 C)] 98.4 F (36.9 C) (02/15 0400) Pulse Rate:  [95-123] 98 (02/15 0400) Resp:  [19-29] 27 (02/15 0400) BP: (105-132)/(60-79) 107/60 mmHg (02/15 0400) SpO2:  [92 %-96 %] 94 % (02/15 0432) FiO2 (%):  [40 %-50 %] 40 % (02/14 1000) HEMODYNAMICS:   VENTILATOR SETTINGS: Vent Mode:  [-]  FiO2 (%):  [40 %-50 %] 40 % INTAKE / OUTPUT: Intake/Output     02/14 0701 - 02/15 0700 02/15 0701 - 02/16 0700   I.V. (mL/kg) 210 (2)    NG/GT 50    IV Piggyback 1112.5    Total Intake(mL/kg) 1372.5 (13.2)    Urine (mL/kg/hr) 1000 (0.4)    Emesis/NG output 400 (0.2)    Total Output 1400     Net -27.5           Urine Occurrence 2 x      PHYSICAL EXAMINATION: General:  Ill appearing, speaks in full sentences Neuro:  Alert, follows commands, normal strength. 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 Musculoskeletal:  No edema Skin:  No rashes  LABS:  Recent Labs Lab 10/31/12 0545 10/31/12 1322 11/01/12 0439 11/02/12 0428 11/02/12 1850 11/02/12 2050 11/02/12 2358 11/03/12 0337 11/04/12 0210  HGB 15.7*  --  13.8 15.0  --   --   --  14.6 13.8  WBC 10.3  --  5.8 19.1*  --   --   --  17.6* 12.8*  PLT 266  --  235 251  --   --   --  223 230  NA 137  --  138 135  --   --   --  138 141  K 3.5  --  4.1 3.8  --   --   --  3.7 3.2*  CL 102  --  108 101  --   --   --  101 101  CO2 23  --  25 25  --   --   --  30 30  GLUCOSE 128*  --  127* 169*  --   --   --  138* 101*  BUN 20  --  9 8  --   --   --  9 17  CREATININE 0.71  --  0.71 0.58  --   --   --  0.66 1.40*  CALCIUM 9.0  --  8.5 8.7  --   --   --  8.8 9.0  MG  --   --  1.8  --   --   --   --  1.7 1.8  AST 12  --   --   --   --   --   --   --   --   ALT <5  --   --   --   --   --   --   --   --   ALKPHOS 90  --   --   --   --   --   --   --   --   BILITOT 0.2*  --   --   --   --   --   --   --   --   PROT 7.3  --   --   --   --   --   --   --   --   ALBUMIN 3.6  --   --   --   --   --   --   --   --   APTT  --  33  --   --   --   --   --   --   --   INR  --  1.01  --   --   --   --   --   --   --   TROPONINI  --   --   --   --  <0.30 <0.30 <0.30  --   --     Recent Labs Lab 11/03/12 0542 11/03/12 1112 11/03/12 1728 11/04/12 0010 11/04/12 0600  GLUCAP 139* 137* 96 102* 106*    Imaging: Dg Chest 1 View  11/02/2012  *RADIOLOGY REPORT*  Clinical Data: Postoperative hypoxia.  CHEST - 1 VIEW  Comparison: CT chest 11/02/2012 and chest radiograph 11/01/2012.   Findings: Nasogastric tube terminates in the stomach.  Trachea is midline.  Heart size normal.  There is collapse of the right middle and right lower lobes.  Scattered air space disease in the left lung.  No definite left pleural fluid.  IMPRESSION:  1.  Interval right middle and right lower lobe collapse, secondary to a mucous plugging. 2.  Scattered left lung air space disease.   Original Report Authenticated By: Leanna Battles, M.D.    Ct Angio Chest Pe W/cm &/or Wo Cm  11/02/2012  *RADIOLOGY REPORT*  Clinical Data: acute onset of hypoxia  CT ANGIOGRAPHY CHEST  Technique:  Multidetector CT imaging of the chest using the standard protocol during bolus administration of intravenous contrast. Multiplanar reconstructed images including MIPs were obtained and reviewed to evaluate the vascular anatomy.  Contrast: OMNIPAQUE IOHEXOL 350 MG/ML SOLN the  Comparison: None  Findings: Lungs/pleura: There is no pleural effusion.  There is complete collapse / consolidation of the right lower lobe and right middle lobe. There is occlusion of the airway to the right middle lobe and right lower lobe.  Patchy areas of ground-glass attenuation are identified within the basilar portion of the right upper lobe and  scattered throughout the left lung.  Small nonspecific pulmonary nodules are identified bilaterally.  Index nodule within the left lower lobe measures 3 mm, image 56.  Within the right upper lobe there is a 3 mm nodule, image number 36. Calcified granuloma in the superior segment left lower lobe identified.  Heart/Mediastinum: Main pulmonary artery and its branches are patent.  No evidence for acute pulmonary embolus.Normal heart size. No pericardial effusion.  Prominent mediastinal lymph nodes are identified.  Index right paratracheal lymph node measures 1.3 cm, image 34.  There is a prevascular lymph node which measures 9 mm, image 31. No enlarged axillary or supraclavicular lymph nodes identified.  Thyroid gland  appears normal.  Upper abdomen: Mild diffuse low attenuation throughout the liver parenchyma noted.  Bones/Musculoskeletal:  Review of the visualized osseous structures is significant for mild thoracic spondylosis.  IMPRESSION:  1.  No evidence for acute pulmonary embolus. 2. Occlusion of the airway to the right middle lobe and right lower lobe with complete collapse of these areas.  Findings may be due to aspiration and/or mucous plugging.  This is a new finding since 10/31/2012. 3.  Scattered nonspecific pulmonary nodules.If the patient is at high risk for bronchogenic carcinoma, follow-up chest CT at 1 year is recommended.  If the patient is at low risk, no follow-up is needed.  This recommendation follows the consensus statement: Guidelines for Management of Small Pulmonary Nodules Detected on CT Scans:  A Statement from the Fleischner Society as published in Radiology 2005; 237:395-400.  4.  Prior granulomatous disease 5.  Hepatic steatosis.   Original Report Authenticated By: Signa Kell, M.D.    Dg Chest Port 1 View  11/04/2012  *RADIOLOGY REPORT*  Clinical Data: Follow up respiratory failure.  PORTABLE CHEST - 1 VIEW  Comparison: Chest radiograph performed 11/03/2012  Findings: The lungs are hypoexpanded.  Persistent bilateral airspace opacities are noted, perhaps slightly improved from the prior study.  Underlying vascular congestion is again seen.  No definite pleural effusion or pneumothorax is identified.  The cardiomediastinal silhouette is borderline normal in size.  The patient's enteric tube is noted ending at the body of the stomach, with the sideport noted about the gastroesophageal junction.  No acute osseous abnormalities are seen.  IMPRESSION:  1.  Lungs hypoexpanded.  Persistent bilateral airspace opacities, perhaps slightly improved from the prior study, with underlying vascular congestion.  This may reflect either pulmonary edema or pneumonia. 2.  Enteric tube noted ending at the body of  the stomach, with the sideport noted about the gastroesophageal junction.   Original Report Authenticated By: Tonia Ghent, M.D.    Dg Chest Port 1 View  11/03/2012  *RADIOLOGY REPORT*  Clinical Data: Mucous plugging  PORTABLE CHEST - 1 VIEW  Comparison: Chest radiograph 11/02/2012  Findings: Endotracheal tube unchanged.  There is significant improvement in atelectasis at the right lung base.  There is increased perihilar air space disease compared to prior.  Low lung volumes.  No pneumothorax.  IMPRESSION:  1.  Significant improvement in right lower lobe atelectasis. 2.  New bilateral air space disease consistent with pulmonary edema versus infiltrates or potentially aspiration pneumonitis.   Original Report Authenticated By: Genevive Bi, M.D.      ASSESSMENT / PLAN:  A: Acute hypoxic respiratory failure 2nd to mucus plugging >> develop b/l pulmonary infiltrates 2/14 concerning for aspiration with right main bronchus collapse and nonspecific pulmonary nodules. CT angiogram chest 11/03/2012 also negative for pulmonary embolism  Hx of asthma and tobacco  abuse.   - On 11/04/2012: Respiratory issues not a dominant seen   PLAN Oxygen to keep SpO2 > 92% Agree with lasix x 1 on 2/14 to keep in negative fluid balance Agree with addition of vancomycin/zosyn pending BAL results Bronchial hygiene with flutter valve Continue albuterol/ipratopium/pulmicort F/u CXR Will need outpatient CT chest followup with pulmonary for nodules  A:  Partial SBO s/p repair. Hx of GERD. P:   Post-op care, nutrition per [facility]  A:  Lethargy likely related to narcotics and hypoxia >> improved 2/14. Hx of RLS, fibromyalgia. P:  Use  narcotics Monitor mental status Avoid nonsteroidals due to renal failure   A new onset renal failure 11/04/2012 following 2 doses of Toradol prior 24 hours P - Avoid all nonsteroidals - List Toradol and allergies Fluid bolus - Accurate I.'s and O. Is Close monitoring-    recheck creatinine at 3 PM 11/04/2012    PCCM will continue to assist with medical and pulmonary management while pt in SDU.    Dr. Kalman Shan, M.D., [facility].C.P Pulmonary and Critical Care Medicine Staff Physician [facility] Pager: [phone], If no answer or between  15:00h - 7:00h: call [phone]  11/04/2012 7:49 AM

## 2012-11-04 NOTE — Progress Notes (Signed)
ANTICOAGULATION CONSULT NOTE - Initial Consult  Pharmacy Consult for Lovenox Indication: VTE prophylaxis  No Known Allergies  Patient Measurements: Height: 5\' 9"  (175.3 cm) Weight: 230 lb (104.327 kg) IBW/kg (Calculated) : 66.2 Heparin Dosing Weight:   Vital Signs: Temp: 98.4 F (36.9 C) (02/15 0400) Temp src: Oral (02/15 0400) BP: 107/60 mmHg (02/15 0400) Pulse Rate: 98 (02/15 0400)  Labs:  Recent Labs  11/02/12 0428 11/02/12 1850 11/02/12 2050 11/02/12 2358 11/03/12 0337 11/04/12 0210  HGB 15.0  --   --   --  14.6 13.8  HCT 45.1  --   --   --  43.4 41.7  PLT 251  --   --   --  223 230  CREATININE 0.58  --   --   --  0.66 1.40*  TROPONINI  --  <0.30 <0.30 <0.30  --   --     Estimated Creatinine Clearance: 59.7 ml/min (by C-G formula based on Cr of 1.4).   Medical History: Past Medical History  Diagnosis Date  . Diverticulitis   . Fibromyalgia   . Restless leg syndrome     Bilateral  . Asthma   . Bronchitis, chronic     Effects worse with URI  . Kidney stones     at least 6 stones in past  . Headache     migraine headache  . Acid reflux disease   . Anxiety     Medications:  Scheduled:  . ipratropium  0.5 mg Nebulization Q4H   And  . albuterol  2.5 mg Nebulization Q4H  . antiseptic oral rinse  15 mL Mouth Rinse q12n4p  . budesonide (PULMICORT) nebulizer solution  0.5 mg Nebulization BID  . chlorhexidine  15 mL Mouth Rinse BID  . enoxaparin  40 mg Subcutaneous Q24H  . fentaNYL      . insulin aspart  0-9 Units Subcutaneous Q6H  . midazolam      . pantoprazole (PROTONIX) IV  40 mg Intravenous Q24H  . piperacillin-tazobactam (ZOSYN)  IV  3.375 g Intravenous Q8H  . [COMPLETED] potassium chloride  10 mEq Intravenous Q1 Hr x 2  . sodium chloride  1,000 mL Intravenous Once  . [COMPLETED] vancomycin  2,000 mg Intravenous Once  . vancomycin  1,000 mg Intravenous Q12H   Infusions:  . sodium chloride 10 mL/hr at 11/02/12 0900  . sodium chloride      PRN: albuterol, HYDROmorphone (DILAUDID) injection, lip balm, ondansetron, phenol, [DISCONTINUED] diphenhydrAMINE, [DISCONTINUED] ketorolac, [DISCONTINUED] LORazepam  Assessment: 52 yo F , smoker Patient has a BMI > 33.  Goal of Therapy:  Heparin level(LMWH)= 0.3-0.6 units/ml  Monitor platelets by anticoagulation protocol: Yes   Plan:  Lovenox40mg  SQ q 24 hours. Patient has been on this and could receive Lovenox 50mg  q 24 hours given size (>100kg) and BMI =33 Will continue with Lovenox 40mg  SQ q 24 however due to spike from 0.66 to 1.40 in the SCr. May consider increasing to 50mg  q 24 if  Serum creatinine improves.  Loletta Specter 11/04/2012,8:32 AM

## 2012-11-04 NOTE — Progress Notes (Signed)
eLink Physician-Brief Progress Note Patient Name: [name] DOB: 1961/07/05 MRN: [id number]  Date of Service  11/04/2012   HPI/Events of Note   Anxiety, ativan d/c'd  eICU Interventions  On home valium for anxiety, reordered ativan   Intervention Category Minor Interventions: Agitation / anxiety - evaluation and management  Rivky Clendenning J 11/04/2012, 4:26 PM

## 2012-11-05 LAB — PHOSPHORUS: Phosphorus: 3.4 mg/dL (ref 2.3–4.6)

## 2012-11-05 LAB — PRO B NATRIURETIC PEPTIDE: Pro B Natriuretic peptide (BNP): 313.3 pg/mL — ABNORMAL HIGH (ref 0–125)

## 2012-11-05 LAB — CULTURE, BAL-QUANTITATIVE W GRAM STAIN: Colony Count: >=100000

## 2012-11-05 LAB — GLUCOSE, CAPILLARY
Glucose-Capillary: 106 mg/dL — ABNORMAL HIGH (ref 70–99)
Glucose-Capillary: 103 mg/dL — ABNORMAL HIGH (ref 70–99)
Glucose-Capillary: 86 mg/dL (ref 70–99)
Glucose-Capillary: 94 mg/dL (ref 70–99)

## 2012-11-05 LAB — BASIC METABOLIC PANEL
BUN: 21 mg/dL (ref 6–23)
CO2: 27 mEq/L (ref 19–32)
Calcium: 8.6 mg/dL (ref 8.4–10.5)
Chloride: 104 mEq/L (ref 96–112)
Creatinine, Ser: 1.49 mg/dL — ABNORMAL HIGH (ref 0.50–1.10)
GFR calc Af Amer: 45 mL/min — ABNORMAL LOW (ref >90)
GFR calc non Af Amer: 39 mL/min — ABNORMAL LOW (ref >90)
Glucose, Bld: 105 mg/dL — ABNORMAL HIGH (ref 70–99)
Potassium: 3.3 mEq/L — ABNORMAL LOW (ref 3.5–5.1)
Sodium: 142 mEq/L (ref 135–145)

## 2012-11-05 LAB — MAGNESIUM: Magnesium: 2 mg/dL (ref 1.5–2.5)

## 2012-11-05 LAB — PROCALCITONIN: Procalcitonin: 0.48 ng/mL

## 2012-11-05 MED ORDER — POTASSIUM CHLORIDE 10 MEQ/100ML IV SOLN
10.0000 meq | INTRAVENOUS | Status: AC
  Administered 2012-11-05 (×4): 10 meq via INTRAVENOUS
  Filled 2012-11-05 (×4): qty 100

## 2012-11-05 NOTE — Progress Notes (Signed)
eLink Physician-Brief Progress Note Patient Name: [name]  Date of Service  11/05/2012   HPI/Events of Note     eICU Interventions  Hypokalemia, repleted AKI noted, UOP adequate   Intervention Category Intermediate Interventions: Electrolyte abnormality - evaluation and management  Adrain Butrick 11/05/2012, 6:17 AM

## 2012-11-05 NOTE — Progress Notes (Signed)
4 Days Post-Op  Subjective: UOP better, pain controlled, no ostomy function yet  Objective: Vital signs in last 24 hours: Temp:  [97.6 F (36.4 C)-99.7 F (37.6 C)] 98.2 F (36.8 C) (02/16 0400) Pulse Rate:  [88-103] 88 (02/16 0405) Resp:  [22-31] 24 (02/16 0405) BP: (115-146)/(58-78) 136/68 mmHg (02/16 0405) SpO2:  [91 %-96 %] 94 % (02/16 0405) Last BM Date:  (Colostomy)   Intake/Output from previous day: 02/15 0701 - 02/16 0700 In: 3435 [I.V.:3010; IV Piggyback:425] Out: 1650 [Urine:1650] Intake/Output this shift:    General appearance: alert, no distress and congested doesn't want to move much. Resp: Bilat rales and wheezing GI: soft, no bowel sounds nothing in the ostomy bag, wound vac in place  Lab Results:   Recent Labs  11/03/12 0337 11/04/12 0210  WBC 17.6* 12.8*  HGB 14.6 13.8  HCT 43.4 41.7  PLT 223 230    BMET  Recent Labs  11/04/12 1550 11/05/12 0341  NA 140 142  K 3.4* 3.3*  CL 99 104  CO2 30 27  GLUCOSE 117* 105*  BUN 23 21  CREATININE 1.65* 1.49*  CALCIUM 9.0 8.6   PT/INR No results found for this basename: LABPROT, INR,  in the last 72 hours   Recent Labs Lab 10/31/12 0545  AST 12  ALT <5  ALKPHOS 90  BILITOT 0.2*  PROT 7.3  ALBUMIN 3.6     Lipase     Component Value Date/Time   LIPASE 38 10/31/2012 0545     Studies/Results: Dg Chest Port 1 View  11/04/2012  *RADIOLOGY REPORT*  Clinical Data: Follow up respiratory failure.  PORTABLE CHEST - 1 VIEW  Comparison: Chest radiograph performed 11/03/2012  Findings: The lungs are hypoexpanded.  Persistent bilateral airspace opacities are noted, perhaps slightly improved from the prior study.  Underlying vascular congestion is again seen.  No definite pleural effusion or pneumothorax is identified.  The cardiomediastinal silhouette is borderline normal in size.  The patient's enteric tube is noted ending at the body of the stomach, with the sideport noted about the gastroesophageal  junction.  No acute osseous abnormalities are seen.  IMPRESSION:  1.  Lungs hypoexpanded.  Persistent bilateral airspace opacities, perhaps slightly improved from the prior study, with underlying vascular congestion.  This may reflect either pulmonary edema or pneumonia. 2.  Enteric tube noted ending at the body of the stomach, with the sideport noted about the gastroesophageal junction.   Original Report Authenticated By: Tonia Ghent, M.D.     Medications: . ipratropium  0.5 mg Nebulization Q4H   And  . albuterol  2.5 mg Nebulization Q4H  . antiseptic oral rinse  15 mL Mouth Rinse q12n4p  . budesonide (PULMICORT) nebulizer solution  0.5 mg Nebulization BID  . chlorhexidine  15 mL Mouth Rinse BID  . enoxaparin  40 mg Subcutaneous Q24H  . insulin aspart  0-9 Units Subcutaneous Q6H  . pantoprazole (PROTONIX) IV  40 mg Intravenous Q24H  . piperacillin-tazobactam (ZOSYN)  IV  3.375 g Intravenous Q8H  . potassium chloride  10 mEq Intravenous Q1 Hr x 4  . vancomycin  1,500 mg Intravenous Q24H   . sodium chloride 10 mL/hr at 11/02/12 0900  . sodium chloride 100 mL/hr at 11/05/12 0042   Assessment/Plan 1. 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 2. Incisional hernia. 3. Dense intra-abdominal adhesions.  S/P 1. Exploratory laparotomy with closure of parastomal hernia primarily.  2.  Extensive lysis of adhesions taking 1 hour.  3. Repair of multiple small incisional hernia, which was primary.  Post op hypoxia with mucus plugging RML AND RLL- Bronchoscopy by DR. SOOD 11/02/12 CXR Shows new infiltrates possible pulmonary edema vs aspiration pnemonia, bilateral. Medicine is following with [place] for this 1. 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 Claud Kelp.  2. 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  3.  Nonhealing skin ulcer left lower quadrant.  4. Fibromyalgia  5. Anxiety  6. Restless leg syndrome  7. Ongoing tobacco  8. History of nephrolithiasis  9. BMI of approximately 38.  Plan: Post op ileus- cont NPO, await return of bowel function Cont IV fluids, Cr better today Wound vac can probably be changed by wound RN with premedication on Tues   LOS: 5 days    [name] C. 11/05/2012

## 2012-11-05 NOTE — Progress Notes (Addendum)
PULMONARY  / CRITICAL CARE MEDICINE  Name: [name] MRN: [id number] DOB: 20-Apr-1961    ADMISSION DATE:  10/31/2012 CONSULTATION DATE:  11/02/2012  REFERRING MD :  CCS  CHIEF COMPLAINT:  Short of breath  BRIEF PATIENT DESCRIPTION:  52 yo smoker admitted with abdominal pain, nausea/vomiting from partial SBP 2nd to chronic parastomal hernia, and had repair 2/12.  Transfer to SDU 2/13 due to lethargy, hypoxia and found to have mucus plugging of Rt middle and Rt lower lobes, and PCCM consulted.  SIGNIFICANT EVENTS / STUDIES:  2/12 repair of hernia and SBO 2/13 transfer to SDU, hypoxia, Bronchoscopy 2/14 Add Abx  LINES / TUBES: None  CULTURES: Urine 2/11 >> negative BAL 2/13 >> GPC cocci/chains >> MRSA PCR and MSSA PCR 10/31/2012 > negative    ANTIBIOTICS: Cefoxitin 2/12 >> 2/12  Vancomycin 2/14 >> Zosyn 2/14 >>   EVENTS 11/04/11: Breathing okay.  Still sore in abdomen.  Has some cough, but still trouble expectorating. 11/04/2012: Received 2 doses of Toradol past 24 hours. Past 12 hours no urine output and has rising creatinine 1.4 mg percent. She complains of some orthostatic dizziness and some increased thirst. Had one episode of fever last night but resolved. No respiratory issues    SUBJECTIVE/OVERNIGHT/INTERVAL HX  11/05/2012: Renal function is improving. Hypoxemia is better although we are unable to wean her below 2 L. No new issues  VITAL SIGNS: Temp:  [97.6 F (36.4 C)-99.7 F (37.6 C)] 97.9 F (36.6 C) (02/16 0800) Pulse Rate:  [88-99] 88 (02/16 0405) Resp:  [22-31] 24 (02/16 0405) BP: (115-146)/(58-75) 136/68 mmHg (02/16 0405) SpO2:  [91 %-95 %] 93 % (02/16 0821) HEMODYNAMICS:   VENTILATOR SETTINGS:   INTAKE / OUTPUT: Intake/Output     02/15 0701 - 02/16 0700 02/16 0701 - 02/17 0700   I.V. (mL/kg) 3010 (28.9)    NG/GT     IV Piggyback 425    Total Intake(mL/kg) 3435 (32.9)    Urine (mL/kg/hr) 1650 (0.7)    Emesis/NG output     Total Output  1650     Net +1785          Urine Occurrence 1 x      PHYSICAL EXAMINATION: General:  Ill appearing but looking increasingly better, speaks in full sentences Neuro:  Alert, follows commands, normal strength. 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 Looks deconditioned Skin:  No rashes  LABS:  Recent Labs Lab 10/31/12 0545 10/31/12 1322  11/02/12 0428 11/02/12 1850 11/02/12 2050 11/02/12 2358 11/03/12 0337 11/04/12 0210 11/04/12 1550 11/05/12 0341  HGB 15.7*  --   < > 15.0  --   --   --  14.6 13.8  --   --   WBC 10.3  --   < > 19.1*  --   --   --  17.6* 12.8*  --   --   PLT 266  --   < > 251  --   --   --  223 230  --   --   NA 137  --   < > 135  --   --   --  138 141 140 142  K 3.5  --   < > 3.8  --   --   --  3.7  3.2* 3.4* 3.3*  CL 102  --   < > 101  --   --   --  101 101 99 104  CO2 23  --   < > 25  --   --   --  30 30 30 27   GLUCOSE 128*  --   < > 169*  --   --   --  138* 101* 117* 105*  BUN 20  --   < > 8  --   --   --  9 17 23 21   CREATININE 0.71  --   < > 0.58  --   --   --  0.66 1.40* 1.65* 1.49*  CALCIUM 9.0  --   < > 8.7  --   --   --  8.8 9.0 9.0 8.6  MG  --   --   < >  --   --   --   --  1.7 1.8  --  2.0  PHOS  --   --   --   --   --   --   --   --   --   --  3.4  AST 12  --   --   --   --   --   --   --   --   --   --   ALT <5  --   --   --   --   --   --   --   --   --   --   ALKPHOS 90  --   --   --   --   --   --   --   --   --   --   BILITOT 0.2*  --   --   --   --   --   --   --   --   --   --   PROT 7.3  --   --   --   --   --   --   --   --   --   --   ALBUMIN 3.6  --   --   --   --   --   --   --   --   --   --   APTT  --  33  --   --   --   --   --   --   --   --   --   INR  --  1.01  --   --   --   --   --   --   --   --   --   TROPONINI  --   --   --    --  <0.30 <0.30 <0.30  --   --   --   --   PROBNP  --   --   --   --   --   --   --   --   --   --  313.3*  < > = values in this interval not displayed.  Recent Labs Lab 11/04/12 0600 11/04/12 1217 11/04/12 1746 11/05/12 0003 11/05/12 0609  GLUCAP 106* 87 97 106* 103*    Imaging: Dg Chest Port 1 View  11/04/2012  *RADIOLOGY REPORT*  Clinical Data: Follow up respiratory failure.  PORTABLE CHEST - 1 VIEW  Comparison: Chest radiograph performed 11/03/2012  Findings: The lungs are hypoexpanded.  Persistent bilateral airspace opacities are noted, perhaps slightly improved from the prior study.  Underlying vascular congestion is again seen.  No definite pleural effusion or pneumothorax is identified.  The cardiomediastinal silhouette is borderline normal in size.  The patient's enteric tube is noted ending at the body of the stomach, with the sideport noted about the gastroesophageal junction.  No acute osseous abnormalities are seen.  IMPRESSION:  1.  Lungs hypoexpanded.  Persistent bilateral airspace opacities, perhaps slightly improved from the prior study, with underlying vascular congestion.  This may reflect either pulmonary edema or pneumonia. 2.  Enteric tube noted ending at the body of the stomach, with the sideport noted about the gastroesophageal junction.   Original Report Authenticated By: Tonia Ghent, M.D.      ASSESSMENT / PLAN:  A: Acute hypoxic respiratory failure 2nd to mucus plugging >> develop b/l pulmonary infiltrates 2/14 concerning for aspiration with right main bronchus collapse and nonspecific pulmonary nodules. CT angiogram chest 11/03/2012 also negative for pulmonary embolism  Hx of asthma and tobacco abuse.   - On 11/04/2012: Respiratory issues not a dominant theme. Status post Lasix on 11/03/2012 - 11/05/2012: Dependent on 2 L the chest x-ray showing atelectasis  PLAN Oxygen to keep SpO2 > 92% Stop vancomycin due to MRSA PCR being negative Continue with  zosyn  . Stop Depending on pro-calcitonin algorithm Bronchial hygiene with flutter valve; emphasized on continued aggressive pulmonary toilet Continue albuterol/ipratopium/pulmicort F/u CXR periodically Will need outpatient CT chest followup with pulmonary for nodules  A:  Partial SBO s/p repair. Hx of GERD. P:   Post-op care, nutrition per CCS  A:  Lethargy likely related to narcotics and hypoxia >> improved 2/14. Hx of RLS, fibromyalgia. -On 11/05/2012: Pain and lethargy not an issue P:  Use  Narcotics judiciously Monitor mental status Avoid nonsteroidals due to renal failure on 11/04/2012   A new onset renal failure 11/04/2012 following 2 doses of Toradol prior 24 hours - On 11/05/2012: This is improved with fluid bolus P - Avoid all nonsteroidals - Listed Toradol as allergies - Continue hydration  PCCM will continue to assist with medical and pulmonary management while pt in SDU.    Dr. Kalman Shan, M.D., [facility].C.P Pulmonary and Critical Care Medicine Staff Physician  System [place] [facility] Pager: [phone], If no answer or between  15:00h - 7:00h: call [phone]  11/05/2012 9:30 AM

## 2012-11-05 NOTE — Progress Notes (Signed)
Short episodes of sinus tach ? SVT. Asymptomatic.  Notified [employer] and sent a copy of the strips.

## 2012-11-06 LAB — BASIC METABOLIC PANEL
BUN: 18 mg/dL (ref 6–23)
CO2: 24 mEq/L (ref 19–32)
Calcium: 8.5 mg/dL (ref 8.4–10.5)
Chloride: 107 mEq/L (ref 96–112)
Creatinine, Ser: 1.22 mg/dL — ABNORMAL HIGH (ref 0.50–1.10)
GFR calc Af Amer: 58 mL/min — ABNORMAL LOW (ref >90)
GFR calc non Af Amer: 50 mL/min — ABNORMAL LOW (ref >90)
Glucose, Bld: 98 mg/dL (ref 70–99)
Potassium: 3.4 mEq/L — ABNORMAL LOW (ref 3.5–5.1)
Sodium: 140 mEq/L (ref 135–145)

## 2012-11-06 LAB — GLUCOSE, CAPILLARY
Glucose-Capillary: 103 mg/dL — ABNORMAL HIGH (ref 70–99)
Glucose-Capillary: 137 mg/dL — ABNORMAL HIGH (ref 70–99)
Glucose-Capillary: 150 mg/dL — ABNORMAL HIGH (ref 70–99)
Glucose-Capillary: 166 mg/dL — ABNORMAL HIGH (ref 70–99)
Glucose-Capillary: 87 mg/dL (ref 70–99)

## 2012-11-06 LAB — PHOSPHORUS: Phosphorus: 3.5 mg/dL (ref 2.3–4.6)

## 2012-11-06 LAB — MAGNESIUM: Magnesium: 1.9 mg/dL (ref 1.5–2.5)

## 2012-11-06 LAB — PROCALCITONIN: Procalcitonin: 0.23 ng/mL

## 2012-11-06 MED ORDER — DIAZEPAM 5 MG PO TABS
5.0000 mg | ORAL_TABLET | Freq: Three times a day (TID) | ORAL | Status: DC | PRN
  Administered 2012-11-06 (×2): 5 mg via ORAL
  Administered 2012-11-07 – 2012-11-10 (×4): 10 mg via ORAL
  Administered 2012-11-11: 5 mg via ORAL
  Filled 2012-11-06: qty 1
  Filled 2012-11-06: qty 2
  Filled 2012-11-06: qty 1
  Filled 2012-11-06: qty 2
  Filled 2012-11-06: qty 1
  Filled 2012-11-06 (×2): qty 2

## 2012-11-06 MED ORDER — POTASSIUM CHLORIDE CRYS ER 20 MEQ PO TBCR
40.0000 meq | EXTENDED_RELEASE_TABLET | Freq: Once | ORAL | Status: AC
  Administered 2012-11-06: 40 meq via ORAL
  Filled 2012-11-06: qty 2

## 2012-11-06 MED ORDER — AMITRIPTYLINE HCL 25 MG PO TABS
25.0000 mg | ORAL_TABLET | Freq: Every day | ORAL | Status: DC
  Administered 2012-11-06 – 2012-11-10 (×5): 25 mg via ORAL
  Filled 2012-11-06 (×7): qty 1

## 2012-11-06 MED ORDER — ENOXAPARIN SODIUM 40 MG/0.4ML ~~LOC~~ SOLN
40.0000 mg | SUBCUTANEOUS | Status: DC
  Administered 2012-11-07 – 2012-11-11 (×5): 40 mg via SUBCUTANEOUS
  Filled 2012-11-06 (×6): qty 0.4

## 2012-11-06 NOTE — Progress Notes (Signed)
ANTIBIOTIC CONSULT NOTE - FOLLOW UP  Pharmacy Consult for Zosyn Indication: pneumonia  Allergies  Allergen Reactions  . Tramadol     Per Dr. Threasa Beards is renal failure    Patient Measurements: Height: 5\' 9"  (175.3 cm) Weight: 235 lb 10.8 oz (106.9 kg) IBW/kg (Calculated) : 66.2  Vital Signs: Temp: 97.4 F (36.3 C) (02/17 0800) Temp src: Oral (02/17 0800) BP: 138/68 mmHg (02/17 0800) Pulse Rate: 75 (02/17 0800)  Labs:  Recent Labs  11/04/12 0210 11/04/12 1550 11/05/12 0341 11/06/12 0334  WBC 12.8*  --   --   --   HGB 13.8  --   --   --   PLT 230  --   --   --   CREATININE 1.40* 1.65* 1.49* 1.22*   Estimated Creatinine Clearance: 69.5 ml/min (by C-G formula based on Cr of 1.22).  Microbiology: Recent Results (from the past 720 hour(s))  URINE CULTURE     Status: None   Collection Time    10/31/12  7:20 AM      Result Value Range Status   Specimen Description URINE, CLEAN CATCH   Final   Special Requests NONE   Final   Culture  Setup Time 10/31/2012 15:12   Final   Colony Count NO GROWTH   Final   Culture NO GROWTH   Final   Report Status 11/01/2012 FINAL   Final  SURGICAL PCR SCREEN     Status: None   Collection Time    11/01/12  5:22 AM      Result Value Range Status   MRSA, PCR NEGATIVE  NEGATIVE Final   Staphylococcus aureus NEGATIVE  NEGATIVE Final   Comment:            The Xpert SA Assay (FDA     approved for NASAL specimens     in patients over 22 years of age),     is one component of     a comprehensive surveillance     program.  Test performance has     been validated by [employer] for patients greater     than or equal to 68 year old.     It is not intended     to diagnose infection nor to     guide or monitor treatment.  CULTURE, BAL-QUANTITATIVE     Status: None   Collection Time    11/02/12  3:05 PM      Result Value Range Status   Specimen Description BRONCHIAL ALVEOLAR LAVAGE   Final   Special Requests NONE   Final    Gram Stain     Final   Value: ABUNDANT WBC PRESENT, PREDOMINANTLY PMN     NO SQUAMOUS EPITHELIAL CELLS SEEN     RARE GRAM POSITIVE COCCI     IN PAIRS IN CHAINS   Colony Count >=100,000 COLONIES/ML   Final   Culture Non-Pathogenic Oropharyngeal-type Flora Isolated.   Final   Report Status 11/05/2012 FINAL   Final    Anti-Infectives: 2/12 >> Cefoxitin >> 2/13 2/14 >> Vanc   >> 2/16 2/14 >> Zosyn  >>    Assessment:  52 yof w/ chronic parastomal hernia, s/p repair 2/12 transferred to SDU 2/13 secondary to lethargy, hypoxia. Broad spectrum antibiotics started on 2/14 given bilateral pulmonary infiltrates concerning for aspiration pneumonia  Day #4 Zosyn, Vancomycin d/c 2/16  SCr trending down, CrCl ~ 60 ml/min (normalized)   Goal of Therapy:  Appropriate abx  dosing, eradication of infection.   Plan:   Continue Zosyn 3.375g IV Q8H infused over 4hrs.  Noted plan for 7 days of abx therapy  Follow up renal function and cultures as available.  Lynann Beaver PharmD, BCPS Pager [phone] 11/06/2012 10:57 AM

## 2012-11-06 NOTE — Progress Notes (Signed)
PULMONARY  / CRITICAL CARE MEDICINE  Name: [name] MRN: [id number] DOB: October 27, 1960    ADMISSION DATE:  10/31/2012 CONSULTATION DATE:  11/02/2012  REFERRING MD :  CCS  CHIEF COMPLAINT:  Short of breath  BRIEF PATIENT DESCRIPTION:  53 yo smoker admitted with abdominal pain, nausea/vomiting from partial SBP 2nd to chronic parastomal hernia, and had repair 2/12.  Transfer to SDU 2/13 due to lethargy, hypoxia and found to have mucus plugging of Rt middle and Rt lower lobes, and PCCM consulted. EVENTS 11/04/11: Breathing okay.  Still sore in abdomen.  Has some cough, but still trouble expectorating. 11/04/2012: Received 2 doses of Toradol past 24 hours. Past 12 hours no urine output and has rising creatinine 1.4 mg percent. She complains of some orthostatic dizziness and some increased thirst. Had one episode of fever last night but resolved. No respiratory issues  SIGNIFICANT EVENTS / STUDIES:  2/12 repair of hernia and SBO 2/13 transfer to SDU, hypoxia, Bronchoscopy 2/14 Add Abx  LINES / TUBES: None  CULTURES: Urine 2/11 >> negative BAL 2/13: > 100K NPF MRSA PCR and MSSA PCR 10/31/2012 > negative    ANTIBIOTICS: Cefoxitin 2/12 >> 2/12  Vancomycin 2/14 >>2/16 Zosyn 2/14 >>    SUBJECTIVE/OVERNIGHT/INTERVAL HX No distress. Feels better.   VITAL SIGNS: Temp:  [97.4 F (36.3 C)-98.8 F (37.1 C)] 97.4 F (36.3 C) (02/17 0800) Pulse Rate:  [76-140] 76 (02/17 0345) Resp:  [24-28] 28 (02/17 0345) BP: (126-141)/(60-70) 126/69 mmHg (02/17 0345) SpO2:  [90 %-100 %] 95 % (02/17 0815) Weight:  [106.9 kg (235 lb 10.8 oz)] 106.9 kg (235 lb 10.8 oz) (02/17 0402) HEMODYNAMICS:   VENTILATOR SETTINGS:   INTAKE / OUTPUT: Intake/Output     02/16 0701 - 02/17 0700 02/17 0701 - 02/18 0700   I.V. (mL/kg) 800 (7.5)    IV Piggyback 525    Total Intake(mL/kg) 1325 (12.4)    Urine (mL/kg/hr) 2000 (0.8)    Stool 2 (0)    Total Output 2002     Net -677          Stool Occurrence 1  x      PHYSICAL EXAMINATION: General:  No distress Neuro:  Alert, follows commands, normal strength. 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 Looks deconditioned Skin:  No rashes  LABS:  Recent Labs Lab 11/04/12 1550 11/05/12 0341 11/06/12 0334  NA 140 142 140  K 3.4* 3.3* 3.4*  CL 99 104 107  CO2 30 27 24   BUN 23 21 18   CREATININE 1.65* 1.49* 1.22*  GLUCOSE 117* 105* 98    Recent Labs Lab 11/02/12 0428 11/03/12 0337 11/04/12 0210  HGB 15.0 14.6 13.8  HCT 45.1 43.4 41.7  WBC 19.1* 17.6* 12.8*  PLT 251 223 230    Recent Labs Lab 11/01/12 0439 11/02/12 0428 11/03/12 0337 11/04/12 0210 11/05/12 0341 11/06/12 0334  PROCALCITON  --   --   --   --  0.48 0.23  WBC 5.8 19.1* 17.6* 12.8*  --   --     Recent Labs Lab 11/05/12 0609 11/05/12 1230 11/05/12 1808 11/05/12 2348 11/06/12 0612  GLUCAP 103* 94 86 103* 87    Imaging: No results found.   ASSESSMENT / PLAN:  A:  Acute  hypoxic respiratory failure 2nd to mucus plugging/ aspiration PNA >> develop b/l pulmonary infiltrates 2/14 concerning for aspiration with right main bronchus collapse and nonspecific pulmonary nodules. She feels better.  Hx of asthma and tobacco abuse. PLAN Oxygen to keep SpO2 > 92% Continue with  Zosyn, will complete 7d total for NOS PNA  Bronchial hygiene with flutter valve; emphasized on continued aggressive pulmonary toilet Continue albuterol/ipratopium/pulmicort F/u CXR periodically Will need outpatient CT chest followup with pulmonary for nodules: have set her up in our office   Follow-up Information   Follow up with [facility], MD On 12/18/2012. (at 2pm)    Contact information:   [address] [place] [address] [phone]        A:    Partial SBO s/p repair. Hx of GERD. P:   Post-op care, nutrition per CCS  A:   Hx of RLS, fibromyalgia. Post-op pain P:  Use  Narcotics judiciously Monitor mental status Avoid nonsteroidals due to renal failure on 11/04/2012   A: Acute renal failure 11/04/2012 following 2 doses of Toradol prior 24 hours - 2/17 scr cont to normalize  P - Avoid all nonsteroidals - Listed Toradol as allergies - Continue hydration  A: Hypokalemia P: Replace and recheck PCCM will continue to assist with medical and pulmonary management while pt in SDU.   11/06/2012 8:44 AM  STAFF NOTE: I, Dr Lavinia Sharps have personally reviewed patient's available data, including medical history, events of note, physical examination and test results as part of my evaluation. I have discussed with resident/NP and other care providers such as pharmacist, RN and RRT.  In addition,  I personally evaluated patient and elicited key findings of pulmonary infiltrates and resolving renal failure. I will see her in the office as listed above. For now pulmonary critical care medicine will sign off*.  Rest per NP/medical resident whose note is outlined above and that I agree with     Dr. Kalman Shan, M.D., [facility].C.P Pulmonary and Critical Care Medicine Staff Physician [facility] [place] [facility] Pager: [phone], If no answer or between  15:00h - 7:00h: call [phone]  11/06/2012 11:46 AM

## 2012-11-06 NOTE — Consult Note (Signed)
WOC consult Note Plan for NPWT (V.A.C.) dressing change with Zola Button, PA tomorrow am.  Requested supplies to bedside this pm from RN. Thanks, Ladona Mow, MSN, RN, [facility], CWOCN [phone])

## 2012-11-06 NOTE — Progress Notes (Signed)
CARE MANAGEMENT NOTE 11/06/2012  Patient:  [name], [name]   Account Number:  [id number]  Date Initiated:  11/03/2012  Documentation initiated by:  Jaykub Mackins  Subjective/Objective Assessment:   sepsis in presense of bowel surg.     Action/Plan:   home   Anticipated DC Date:  11/09/2012   Anticipated DC Plan:  HOME/SELF CARE         Choice offered to / List presented to:             Status of service:  In process, will continue to follow Medicare Important Message given?  NO (If response is "NO", the following Medicare IM given date fields will be blank) Date Medicare IM given:   Date Additional Medicare IM given:    Discharge Disposition:    Per UR Regulation:  Reviewed for med. necessity/level of care/duration of stay  If discussed at Long Length of Stay Meetings, dates discussed:    Comments:  [id number]/Deric Bocock Earlene Plater, RN, BSN, CCM:  CHART REVIEWED AND UPDATED.  Next chart review due on [id number]. NO DISCHARGE NEEDS PRESENT AT THIS TIME.  Transferred to tele floor [id number]. CASE MANAGEMENT [phone]  [id number] Earlene Plater, RN, BSN, CCM:  CHART REVIEWED AND UPDATED.  Next chart review due on [id number]. NO DISCHARGE NEEDS PRESENT AT THIS TIME. CASE MANAGEMENT [phone]

## 2012-11-06 NOTE — Progress Notes (Signed)
5 Days Post-Op  Subjective: UOP good, pain controlled, ostomy functioning  Objective: Vital signs in last 24 hours: Temp:  [97.8 F (36.6 C)-98.8 F (37.1 C)] 98.8 F (37.1 C) (02/17 0402) Pulse Rate:  [76-140] 76 (02/17 0345) Resp:  [24-28] 28 (02/17 0345) BP: (126-141)/(60-70) 126/69 mmHg (02/17 0345) SpO2:  [90 %-100 %] 97 % (02/17 0345) Weight:  [235 lb 10.8 oz (106.9 kg)] 235 lb 10.8 oz (106.9 kg) (02/17 0402) Last BM Date: 11/05/12   Intake/Output from previous day: 02/16 0701 - 02/17 0700 In: 1325 [I.V.:800; IV Piggyback:525] Out: 2002 [Urine:2000; Stool:2] Intake/Output this shift:    General appearance: alert, no distress Resp: nonlabored GI: soft, stool in the ostomy bag, wound vac in place  Lab Results:   Recent Labs  11/04/12 0210  WBC 12.8*  HGB 13.8  HCT 41.7  PLT 230    BMET  Recent Labs  11/05/12 0341 11/06/12 0334  NA 142 140  K 3.3* 3.4*  CL 104 107  CO2 27 24  GLUCOSE 105* 98  BUN 21 18  CREATININE 1.49* 1.22*  CALCIUM 8.6 8.5   PT/INR No results found for this basename: LABPROT, INR,  in the last 72 hours   Recent Labs Lab 10/31/12 0545  AST 12  ALT <5  ALKPHOS 90  BILITOT 0.2*  PROT 7.3  ALBUMIN 3.6     Lipase     Component Value Date/Time   LIPASE 38 10/31/2012 0545     Studies/Results: No results found.  Medications: . ipratropium  0.5 mg Nebulization Q4H   And  . albuterol  2.5 mg Nebulization Q4H  . antiseptic oral rinse  15 mL Mouth Rinse q12n4p  . budesonide (PULMICORT) nebulizer solution  0.5 mg Nebulization BID  . chlorhexidine  15 mL Mouth Rinse BID  . enoxaparin  40 mg Subcutaneous Q24H  . insulin aspart  0-9 Units Subcutaneous Q6H  . pantoprazole (PROTONIX) IV  40 mg Intravenous Q24H  . piperacillin-tazobactam (ZOSYN)  IV  3.375 g Intravenous Q8H     Assessment/Plan 1. 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 2.  Incisional hernia. 3. Dense intra-abdominal adhesions.  S/P 1. Exploratory laparotomy with closure of parastomal hernia primarily.  2. Extensive lysis of adhesions taking 1 hour.  3. Repair of multiple small incisional hernia, which was primary.  Post op hypoxia with mucus plugging RML AND RLL- Bronchoscopy by DR. SOOD 11/02/12 CXR Shows new infiltrates possible pulmonary edema vs aspiration pnemonia, bilateral. Medicine is following with [place] for this 1. 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 Claud Kelp.  2. 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  3. Nonhealing skin ulcer left lower quadrant.  4. Fibromyalgia  5. Anxiety  6. Restless leg syndrome  7. Ongoing tobacco  8. History of nephrolithiasis  9. BMI of approximately 38.  Plan: Post op ileus- resolving, Full liquid diet SW IVF's, Cr better today Wound vac can probably be changed by wound RN with premedication on Tues   LOS: 6 days    [name] C. 11/06/2012

## 2012-11-06 NOTE — Plan of Care (Signed)
Problem: Phase II Progression Outcomes Goal: Tolerating diet Outcome: Progressing Started on full liquids today,has poor appetite today.

## 2012-11-07 LAB — GLUCOSE, CAPILLARY
Glucose-Capillary: 120 mg/dL — ABNORMAL HIGH (ref 70–99)
Glucose-Capillary: 136 mg/dL — ABNORMAL HIGH (ref 70–99)
Glucose-Capillary: 115 mg/dL — ABNORMAL HIGH (ref 70–99)
Glucose-Capillary: 127 mg/dL — ABNORMAL HIGH (ref 70–99)
Glucose-Capillary: 155 mg/dL — ABNORMAL HIGH (ref 70–99)

## 2012-11-07 LAB — BASIC METABOLIC PANEL
BUN: 14 mg/dL (ref 6–23)
CO2: 27 mEq/L (ref 19–32)
Calcium: 8.5 mg/dL (ref 8.4–10.5)
Chloride: 106 mEq/L (ref 96–112)
Creatinine, Ser: 1.27 mg/dL — ABNORMAL HIGH (ref 0.50–1.10)
GFR calc Af Amer: 55 mL/min — ABNORMAL LOW (ref >90)
GFR calc non Af Amer: 47 mL/min — ABNORMAL LOW (ref >90)
Glucose, Bld: 119 mg/dL — ABNORMAL HIGH (ref 70–99)
Potassium: 3 mEq/L — ABNORMAL LOW (ref 3.5–5.1)
Sodium: 143 mEq/L (ref 135–145)

## 2012-11-07 LAB — MAGNESIUM: Magnesium: 1.8 mg/dL (ref 1.5–2.5)

## 2012-11-07 LAB — PHOSPHORUS: Phosphorus: 3.2 mg/dL (ref 2.3–4.6)

## 2012-11-07 MED ORDER — OXYCODONE HCL 5 MG PO TABS
5.0000 mg | ORAL_TABLET | ORAL | Status: DC | PRN
  Administered 2012-11-07: 5 mg via ORAL
  Filled 2012-11-07: qty 1

## 2012-11-07 MED ORDER — IPRATROPIUM BROMIDE 0.02 % IN SOLN
0.5000 mg | Freq: Four times a day (QID) | RESPIRATORY_TRACT | Status: DC
  Administered 2012-11-07 – 2012-11-10 (×12): 0.5 mg via RESPIRATORY_TRACT
  Filled 2012-11-07 (×15): qty 2.5

## 2012-11-07 MED ORDER — PANTOPRAZOLE SODIUM 40 MG PO TBEC
40.0000 mg | DELAYED_RELEASE_TABLET | Freq: Every day | ORAL | Status: DC
  Administered 2012-11-07 – 2012-11-11 (×5): 40 mg via ORAL
  Filled 2012-11-07 (×6): qty 1

## 2012-11-07 MED ORDER — KCL IN DEXTROSE-NACL 40-5-0.45 MEQ/L-%-% IV SOLN
INTRAVENOUS | Status: DC
  Administered 2012-11-07 – 2012-11-11 (×3): via INTRAVENOUS
  Filled 2012-11-07 (×8): qty 1000

## 2012-11-07 MED ORDER — ALBUTEROL SULFATE (5 MG/ML) 0.5% IN NEBU
2.5000 mg | INHALATION_SOLUTION | Freq: Four times a day (QID) | RESPIRATORY_TRACT | Status: DC
  Administered 2012-11-07 – 2012-11-10 (×12): 2.5 mg via RESPIRATORY_TRACT
  Filled 2012-11-07 (×13): qty 0.5

## 2012-11-07 MED ORDER — IPRATROPIUM BROMIDE 0.02 % IN SOLN: 0.5000 mg | Freq: Four times a day (QID) | RESPIRATORY_TRACT | Status: DC

## 2012-11-07 MED ORDER — POTASSIUM CHLORIDE CRYS ER 20 MEQ PO TBCR
30.0000 meq | EXTENDED_RELEASE_TABLET | Freq: Once | ORAL | Status: AC
  Administered 2012-11-07: 30 meq via ORAL
  Filled 2012-11-07: qty 1

## 2012-11-07 MED ORDER — ALBUTEROL SULFATE (5 MG/ML) 0.5% IN NEBU: 2.5000 mg | INHALATION_SOLUTION | RESPIRATORY_TRACT | Status: DC

## 2012-11-07 MED ORDER — POTASSIUM CHLORIDE CRYS ER 20 MEQ PO TBCR
40.0000 meq | EXTENDED_RELEASE_TABLET | Freq: Once | ORAL | Status: DC
  Filled 2012-11-07: qty 2

## 2012-11-07 MED ORDER — POTASSIUM CHLORIDE CRYS ER 20 MEQ PO TBCR
40.0000 meq | EXTENDED_RELEASE_TABLET | Freq: Once | ORAL | Status: AC
  Administered 2012-11-07: 40 meq via ORAL
  Filled 2012-11-07: qty 2

## 2012-11-07 NOTE — Progress Notes (Signed)
ATTENDING ADDENDUM:  I personally reviewed patient's record, examined the patient, and formulated the following assessment and plan:  Did well with dressing changes.  Very motivated to ambulate.  Will have PT/OT assess if she needs placement.  Pt wants to go home with home health.  I anticipate she will be ready for d/c on Friday.    Cr still elevated from baseline but stable- per medicine, will keep on fluids.  Cont abx for a 7 day course per medicine consult- appreciate their assistance with this patient

## 2012-11-07 NOTE — Progress Notes (Signed)
OT Cancellation Note  Patient Details Name: [name] MRN: [id number] DOB: 1961-06-24   Cancelled Treatment:    Reason Eval/Treat Not Completed: Other (comment) (no needs: screen)  Pt plans to stay with family.  They have a 3:1 and tub seat.  They will help with ADLs if she needs any assistance.  She is moving well with PT.    Adaleah Forget 11/07/2012, 3:11 PM Marica Otter, OTR/L [phone] 11/07/2012

## 2012-11-07 NOTE — Consult Note (Addendum)
[facility] Medical Consultation  [name] WUJ:[id number] DOB: 08-23-1961 DOA: 10/31/2012 PCP: [name], MD   Requesting physician: Sherrie George, PA Date of consultation: 11/07/12 Reason for consultation: Medical management of COPD, hypoxia, renal failure.  Impression/Recommendations  Acute exacerbation of COPD with asthma -Continue empiric antibiotics for treatment of aspiration pneumonia. -Continue scheduled bronchodilator therapy treatments and when necessary albuterol for wheezing. -Continue Pulmicort nebulizer treatments twice a day. -Continue flutter valve and incentive spirometry.   Acute respiratory failure with hypoxia secondary to mucous plugging and atelectasis -Status post bronchoscopy with removal of mucous plug on 11/02/2012. -Followed by the critical care team post bronchoscopy. -Respiratory status appears to be stable on supplemental oxygen at this time.   Parastomal hernia -Status post lysis of adhesions and primary closure of hernias Per surgery. Patient has a a colostomy. -Bowel function appears to be slowly returning.   Fibromyalgia -Continue pain medications as needed.   Generalized anxiety disorder -Continue Valium as needed.   Restless legs syndrome -Deferred definitive treatment of the patient's primary care doctor. No evidence of anemia or microcytosis to suggest iron deficiency.   Obesity (BMI 35.0-39.9 without comorbidity) -Consult dietitian for diet education.   Leukocytosis, unspecified -White blood cell count trending down on empiric antibiotics for treatment of aspiration pneumonia.   Hyperglycemia -Hemoglobin A1c is 6.2%, consistent with impaired fasting glucose. Will get dietitian consultation for diet education. -CBGs 120-150.   Cigarette nicotine dependence with withdrawal -RN to provide tobacco cessation information.   HAP (hospital-acquired pneumonia) -Initially treated with vancomycin and Zosyn.  Now on Zosyn  monotherapy.  Would continue for 7 days total (continue antibiotics through 11/10/12).  BAL cultures only grew non-pathogenic bacteria.   Acute pulmonary edema -Diuresis. Current respiratory status stable.   Acute renal failure -Baseline creatinine 0.66. Creatinine reached a high of 1.65 before beginning to trend down. Creatinine still elevated over her usual baseline values but is expected to fully recover.   Hypokalemia -Potassium replaced by primary team.  One of my partners will followup again tomorrow. Please contact me if I can be of assistance in the meanwhile. Thank you for this consultation.  Chief Complaint: No current complaints.  HPI:  The patient is a 52 year old female with history of asthma, RLS, fibromyalgia, anxiety, obesity and history of diverticulitis in 2010 resulting in left colectomy and colostomy. She did not return for subsequent reanastomosis to clinic. She developed a parastomal hernia that has been monitored for the last several years and which has been getting bigger. She presented to the ER with a 2-week history of increasing abdominal pain with nausea and vomiting due to possible incarceration of small bowel in the parastomal hernia. She underwent surgery on 11/01/12 with lysis of adhesions and primary correction of the hernia and several other smaller incisional hernias. The patient's hospital course has been complicated by acute on chronic respiratory failure necessitating transfer to the step down unit and evaluation by the pulmonary critical care team. She underwent bronchoscopy 11/02/2012 and has been on therapy for aspiration pneumonia, and has been on antibiotics since 11/03/2012. She also has been treated for mucus plugging.    Review of Systems:  Constitutional: No fever or chills;  Appetite improved; No weight loss.  HEENT: No blurry vision or diplopia, no pharyngitis or dysphagia CV: No chest pain or arrhythmia.  Resp: Occasional SOB, no cough. GI: Occasional  nausea no vomiting, occasional abdominal discomfort and post incisional pain, no diarrhea, new colostomy with dark liquid stools in the pouch.  GU: No dysuria since Foley removed or hematuria.  MSK: no myalgias/arthralgias.  Neuro:  No headache or focal neurological deficits.  Psych: No depression, + occasional anxiety.  Endo: No thyroid disease or DM.  Skin: No rashes or lesions.  Heme: No anemia or blood dyscrasia   Past Medical History  Diagnosis Date  . Diverticulitis   . Fibromyalgia   . Restless leg syndrome     Bilateral  . Asthma   . Bronchitis, chronic     Effects worse with URI  . Kidney stones     at least 6 stones in past  . Headache     migraine headache  . Acid reflux disease   . Anxiety    Past Surgical History  Procedure Laterality Date  . Colostomy    . Appendectomy    . Cholecystectomy    . Partial hysterectomy      partial  . Colostomy closure N/A 11/01/2012    Procedure: COLOSTOMY CLOSURE;  Surgeon: Clovis Pu. Cornett, MD;  Location: [facility];  Service: General;  Laterality: N/A;  REPAIR PARASTOMAL HERNIA; REVISION OF COLOSTOMY  . Laparotomy  11/01/2012    Procedure: EXPLORATORY LAPAROTOMY;  Surgeon: Clovis Pu. Cornett, MD;  Location: [facility];  Service: General;;  exploratory laparotomy,repair of parastomal hernia and incisional hernia, lysis of adhesions, and application of wound V.A.C.  . Parastomal hernia repair N/A 11/01/2012    Procedure: HERNIA REPAIR PARASTOMAL;  Surgeon: Clovis Pu. Cornett, MD;  Location: [facility];  Service: General;  Laterality: N/A;  . Incisional hernia repair N/A 11/01/2012    Procedure: HERNIA REPAIR INCISIONAL;  Surgeon: Clovis Pu. Cornett, MD;  Location: [facility];  Service: General;  Laterality: N/A;  . Lysis of adhesion N/A 11/01/2012    Procedure: LYSIS OF ADHESION;  Surgeon: Clovis Pu. Cornett, MD;  Location: [facility];  Service: General;  Laterality: N/A;  . Application of wound vac N/A 11/01/2012    Procedure: APPLICATION OF WOUND VAC;  Surgeon:  Clovis Pu. Cornett, MD;  Location: [facility];  Service: General;  Laterality: N/A;   Social History:  reports that she has been smoking.  She has never used smokeless tobacco. She reports that she does not drink alcohol or use illicit drugs.  Allergies  Allergen Reactions  . Tramadol     Per Dr. Threasa Beards is renal failure   Family History  Problem Relation Age of Onset  . Deep vein thrombosis Neg Hx   . Pulmonary embolism Neg Hx     Prior to Admission medications   Medication Sig Start Date End Date Taking? Authorizing Provider  amitriptyline (ELAVIL) 25 MG tablet Take 25 mg by mouth at bedtime.     Yes Historical Provider, MD  Aspirin-Salicylamide-Caffeine (BC HEADACHE POWDER PO) Take 1 packet by mouth daily as needed. Back pain   Yes Historical Provider, MD  diazepam (VALIUM) 10 MG tablet Take 10 mg by mouth every 8 (eight) hours as needed for anxiety.    Yes Historical Provider, MD  omeprazole (PRILOSEC) 20 MG capsule Take 20 mg by mouth daily.     Yes Historical Provider, MD  traMADol (ULTRAM) 50 MG tablet Take 50 mg by mouth every 8 (eight) hours as needed. Maximum dose= 8 tablets per day. pain    Yes Historical Provider, MD   Physical Exam: Blood pressure 146/74, pulse 75, temperature 97.8 F (36.6 C), temperature source Oral, resp. rate 18, height 5\' 5"  (1.651 m), weight 106.142 kg (234 lb), last menstrual period  09/21/2011, SpO2 97.00%. Filed Vitals:   11/07/12 1255 11/07/12 1313 11/07/12 1416 11/07/12 1436  BP:   146/74   Pulse:   75   Temp:   97.8 F (36.6 C)   TempSrc:   Oral   Resp:   18   Height:      Weight:      SpO2: 87% 92% 95% 97%     General:  No acute distress. Sitting up in a chair.  Eyes: Pupils round, equally reactive to light and accommodation. Extraocular movements intact. Sclerae nonicteric.  ENT: Oropharynx clear with moist mucous membranes.  Neck: Supple, no thyromegaly, no lymphadenopathy, no jugular venous distention.  Cardiovascular:  Regular rate, and rhythm. No murmurs, rubs, or gallops.  Respiratory: Diminished breath sounds without wheezes, rhonchi, or rales.  Abdomen: Soft. Colostomy with liquid dark stools in the pouch.  Skin: Warm and dry.  Musculoskeletal: Normal bulk and tone.  Psychiatric: Mood and affect normal.  Neurologic: Non-focal.  Labs on Admission:  Basic Metabolic Panel:  Recent Labs Lab 11/03/12 0337 11/04/12 0210 11/04/12 1550 11/05/12 0341 11/06/12 0334 11/07/12 0430  NA 138 141 140 142 140 143  K 3.7 3.2* 3.4* 3.3* 3.4* 3.0*  CL 101 101 99 104 107 106  CO2 30 30 30 27 24 27   GLUCOSE 138* 101* 117* 105* 98 119*  BUN 9 17 23 21 18 14   CREATININE 0.66 1.40* 1.65* 1.49* 1.22* 1.27*  CALCIUM 8.8 9.0 9.0 8.6 8.5 8.5  MG 1.7 1.8  --  2.0 1.9 1.8  PHOS  --   --   --  3.4 3.5 3.2   CBC:  Recent Labs Lab 11/01/12 0439 11/02/12 0428 11/03/12 0337 11/04/12 0210  WBC 5.8 19.1* 17.6* 12.8*  HGB 13.8 15.0 14.6 13.8  HCT 41.7 45.1 43.4 41.7  MCV 90.5 89.0 90.0 90.5  PLT 235 251 223 230   Cardiac Enzymes:  Recent Labs Lab 11/02/12 1850 11/02/12 2050 11/02/12 2358  TROPONINI <0.30 <0.30 <0.30   CBG:  Recent Labs Lab 11/06/12 2354 11/07/12 0604 11/07/12 0749 11/07/12 1136 11/07/12 1638  GLUCAP 150* 120* 136* 115* 127*    Radiological Exams on Admission: No results found.  Time spent: 1 hour.  Keyandra Swenson [facility] Pager [phone]  If 7PM-7AM, please contact night-coverage [email or web address] Password [facility] 11/07/2012, 5:03 PM

## 2012-11-07 NOTE — Progress Notes (Signed)
Smoking cessation provided to pt per order

## 2012-11-07 NOTE — Consult Note (Signed)
WOC consult Note Reason for Consult:V.A.C. Dressing change (T-TH-Sat). Patient seen with Clance Boll, PA. Wound type:surgical Pressure Ulcer POA: Yes Measurement: 20 cm x 4 cm x 5 cm Wound bed: initially sealed over a false bottom; opened easily with sterile cotton-tipped applicators and gentle separation technique to reveal true depth Drainage (amount, consistency, odor) serosanguinous Periwound: with small area of erythema due to sponge lying on skin.  Also noted is small 1cm serum-filled blister at unbilicus Dressing procedure/placement/frequency: 1 piece of black granufoam used in wound filling (Medium dressing kit).  Seal achieved without difficulty and continuous negative pressure tolerated well.  Next scheduled change is Thursday. Staff may perform if I am not available.  WOC ostomy consult  Stoma type/location: LLQ colostomy with healing wound located at 4 o'clock Stomal assessment/size: 1 and 1/4 inches round, flush with skin.  1 black suture noted on stomal mucosa Peristomal assessment: intact, clear.  Wound healing at 4o'clock-patient is "amazed" that it is recovering so quickly and that it is no longer painful. Treatment options for stomal/peristomal skin: None indicated Output 50ml soft dark green effluent Ostomy pouching: 1pc.pouch (Lawson#[id number]) with a skin barrier ring [facility] [place] [phone]) Education provided: Patient used to wear both 1-piece with convexity and 2 piece pouches, so this 1 piece flat pouch is not a new experience for her.  She is independent in emptying and in general management principles of ostomy.  I will remain available to this patient throughout her admission. Please re-consult if needed in between visits. Thanks, Ladona Mow, MSN, RN, [facility], CWOCN [phone])

## 2012-11-07 NOTE — Evaluation (Signed)
Physical Therapy Evaluation Patient Details Name: [name] MRN: [id number] DOB: October 09, 1960 Today's Date: 11/07/2012 Time: 4098-1191 PT Time Calculation (min): 21 min  PT Assessment / Plan / Recommendation Clinical Impression  52 yo female admitted for surgical repair of hernia who developed respiratory problems post op.  Pt is doing well with ambulation without device, but need supplemental O2 while walking to maintain O2 sats. Do not feel she will need follow up PT or DME at d/c.  Will follow while she is an inpatient for stair training and ambulation.  Recommend pt ambulatie with nursing and O2 tank several times a day .    PT Assessment  Patient needs continued PT services    Follow Up Recommendations  No PT follow up    Does the patient have the potential to tolerate intense rehabilitation      Barriers to Discharge        Equipment Recommendations  None recommended by PT    Recommendations for Other Services     Frequency Min 3X/week    Precautions / Restrictions     Pertinent Vitals/Pain Pt with some abdominal pain with activity      Mobility  Bed Mobility Details for Bed Mobility Assistance: pt sitting up in chair upon arrival Transfers Transfers: Sit to Stand;Stand to Sit Sit to Stand: 7: Independent Stand to Sit: 7: Independent Ambulation/Gait Ambulation/Gait Assistance: 4: Min guard Ambulation Distance (Feet): 200 Feet (leaned against wall to rest and get O2 sats > 90%) Assistive device: None Ambulation/Gait Assistance Details: assist for  IV pole and O2 tank Gait Pattern: Within Functional Limits;Trunk flexed Gait velocity: decreased to manage O2 sats General Gait Details: pt maintains slight trunk flexion to protect anterior abdomen.  She has no  loss of balance, but must walk slowly to control O2 sats which tend to decrease with increased speed and increased distance Stairs: No Wheelchair Mobility Wheelchair Mobility: No    Exercises      PT Diagnosis: Difficulty walking;Generalized weakness  PT Problem List: Decreased activity tolerance;Pain PT Treatment Interventions: Gait training;Stair training   PT Goals Acute Rehab PT Goals PT Goal Formulation: With patient Time For Goal Achievement: 11/21/12 Potential to Achieve Goals: Good Pt will go Supine/Side to Sit: Independently PT Goal: Supine/Side to Sit - Progress: Goal set today Pt will go Sit to Supine/Side: Independently PT Goal: Sit to Supine/Side - Progress: Goal set today Pt will Ambulate: >150 feet;Independently;Other (comment) (with O2 sats . 90%) PT Goal: Ambulate - Progress: Goal set today Pt will Go Up / Down Stairs: 3-5 stairs;with supervision;with least restrictive assistive device;Other (comment) (with O2 sats > 90%) PT Goal: Up/Down Stairs - Progress: Goal set today  Visit Information  Last PT Received On: 11/07/12 Assistance Needed: +1 Reason Eval/Treat Not Completed: Patient at procedure or test/unavailable    Subjective Data  Subjective: "my legs are good" Patient Stated Goal: to go home with family   Prior Functioning  Home Living Lives With: Family Available Help at Discharge: Family Type of Home: House Home Access: Stairs to enter Secretary/administrator of Steps: 3 Home Layout: One level Prior Function Level of Independence: Independent Able to Take Stairs?: Yes Communication Communication: No difficulties    Cognition  Cognition Overall Cognitive Status: Appears within functional limits for tasks assessed/performed Arousal/Alertness: Awake/alert Orientation Level: Appears intact for tasks assessed Behavior During Session: [facility] for tasks performed    Extremity/Trunk Assessment Right Lower Extremity Assessment RLE ROM/Strength/Tone: Within functional levels Left Lower Extremity Assessment  LLE ROM/Strength/Tone: Within functional levels Trunk Assessment Trunk Assessment: Other exceptions Trunk Exceptions: obese with ostomy and  wound vac   Balance Static Sitting Balance Static Sitting - Balance Support: No upper extremity supported Static Sitting - Level of Assistance: 7: Independent Static Standing Balance Static Standing - Balance Support: No upper extremity supported;During functional activity Static Standing - Level of Assistance: 7: Independent  End of Session PT - End of Session Equipment Utilized During Treatment: Oxygen Activity Tolerance: Patient tolerated treatment well Patient left: in chair Nurse Communication: Mobility status  GP   Bayard Hugger. [place], [place] [phone] 11/07/2012, 1:28 PM

## 2012-11-07 NOTE — Progress Notes (Signed)
PHARMACIST - PHYSICIAN COMMUNICATION DR:   CCS CONCERNING: Proton Pump Inhibitor IV to Oral Route Change Policy  The patient is receiving Protonix by the intravenous route. Based on criteria approved by the Pharmacy and Therapeutics Committee and the Medical Executive Committee, the medication is being converted to the equivalent oral dose form.   These criteria include:  -No Active GI bleeding  -Able to tolerate diet of full liquids (or better) or tube feeding  -Able to tolerate other medications by the oral or enteral route   If you have any questions about this conversion, please contact the Pharmacy Department (ext [phone]). Thank you.  Loralee Pacas, PharmD, BCPS 11/07/2012 11:32 AM

## 2012-11-07 NOTE — Progress Notes (Signed)
Patient ID: [name], female   DOB: 20-Apr-1961, 52 y.o.   MRN: [id number] , 6 Days Post-Op  Subjective: Pt reports intermitent significant pain but overall doing well, colostomy with good output, tolerating diet well, denies n/v.  Objective: Vital signs in last 24 hours: Temp:  [97.4 F (36.3 C)-98.6 F (37 C)] 97.4 F (36.3 C) (02/18 0545) Pulse Rate:  [69-91] 69 (02/18 0545) Resp:  [18-29] 18 (02/18 0545) BP: (121-137)/(60-68) 137/66 mmHg (02/18 0545) SpO2:  [88 %-97 %] 94 % (02/18 0545) Weight:  [234 lb (106.142 kg)] 234 lb (106.142 kg) (02/17 2002) Last BM Date: 11/06/12  Intake/Output from previous day: 02/17 0701 - 02/18 0700 In: 452.5 [P.O.:240; I.V.:100; IV Piggyback:112.5] Out: 1400 [Urine:1400] Intake/Output this shift:    PE: Abd: soft, tender over incisions, +BS, ostomy with stool in bag General: well developed, NAD  Lab Results:  No results found for this basename: WBC, HGB, HCT, PLT,  in the last 72 hours BMET  Recent Labs  11/06/12 0334 11/07/12 0430  NA 140 143  K 3.4* 3.0*  CL 107 106  CO2 24 27  GLUCOSE 98 119*  BUN 18 14  CREATININE 1.22* 1.27*  CALCIUM 8.5 8.5   PT/INR No results found for this basename: LABPROT, INR,  in the last 72 hours CMP     Component Value Date/Time   NA 143 11/07/2012 0430   K 3.0* 11/07/2012 0430   CL 106 11/07/2012 0430   CO2 27 11/07/2012 0430   GLUCOSE 119* 11/07/2012 0430   BUN 14 11/07/2012 0430   CREATININE 1.27* 11/07/2012 0430   CALCIUM 8.5 11/07/2012 0430   PROT 7.3 10/31/2012 0545   ALBUMIN 3.6 10/31/2012 0545   AST 12 10/31/2012 0545   ALT <5 10/31/2012 0545   ALKPHOS 90 10/31/2012 0545   BILITOT 0.2* 10/31/2012 0545   GFRNONAA 47* 11/07/2012 0430   GFRAA 55* 11/07/2012 0430   Lipase     Component Value Date/Time   LIPASE 38 10/31/2012 0545       Studies/Results: No results found.  Anti-infectives: Anti-infectives   Start     Dose/Rate Route Frequency Ordered Stop   11/05/12 1800   vancomycin (VANCOCIN) 1,500 mg in sodium chloride 0.9 % 500 mL IVPB  Status:  Discontinued     1,500 mg 250 mL/hr over 120 Minutes Intravenous Every 24 hours 11/04/12 1736 11/05/12 1029   11/04/12 1745  vancomycin (VANCOCIN) 500 mg in sodium chloride 0.9 % 100 mL IVPB     500 mg 100 mL/hr over 60 Minutes Intravenous  Once 11/04/12 1735 11/04/12 1929   11/03/12 2000  vancomycin (VANCOCIN) IVPB 1000 mg/200 mL premix  Status:  Discontinued     1,000 mg 200 mL/hr over 60 Minutes Intravenous Every 12 hours 11/03/12 0824 11/04/12 1734   11/03/12 0900  vancomycin (VANCOCIN) 2,000 mg in sodium chloride 0.9 % 500 mL IVPB     2,000 mg 250 mL/hr over 120 Minutes Intravenous  Once 11/03/12 0824 11/03/12 1053   11/03/12 0830  piperacillin-tazobactam (ZOSYN) IVPB 3.375 g     3.375 g 12.5 mL/hr over 240 Minutes Intravenous 3 times per day 11/03/12 0823     11/03/12 0800  levofloxacin (LEVAQUIN) IVPB 750 mg  Status:  Discontinued     750 mg 100 mL/hr over 90 Minutes Intravenous Every 24 hours 11/03/12 0748 11/03/12 0816   11/01/12 1500  cefOXitin (MEFOXIN) 1 g in dextrose 5 % 50 mL IVPB  1 g 100 mL/hr over 30 Minutes Intravenous Every 6 hours 11/01/12 1338 11/02/12 0859   11/01/12 0600  cefOXitin (MEFOXIN) 2 g in dextrose 5 % 50 mL IVPB  Status:  Discontinued     2 g 100 mL/hr over 30 Minutes Intravenous On call to O.R. 10/31/12 1647 11/01/12 1312       Assessment/Plan 1. POD#6: Large parastomal hernia with small bowel obstruction secondary to  adhesions and partial incarceration in the hernia sac. 2. Incisional hernia. 3. Dense intra-abdominal adhesions.  S/P Exploratory laparotomy with closure of parastomal hernia primarily.  2. Extensive lysis of adhesions taking 1 hour.  3. Repair of multiple small incisional hernia, which was primary.  Post op hypoxia with mucus plugging RML AND RLL- Bronchoscopy by DR. SOOD 11/02/12 CXR Shows new infiltrates possible pulmonary edema vs aspiration pnemonia,  bilateral.  Medicine is following with [place] for this 1. 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 Claud Kelp.  2. 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  3. Nonhealing skin ulcer left lower quadrant.  4. Fibromyalgia  5. Anxiety  6. Restless leg syndrome  7. Ongoing tobacco  8. History of nephrolithiasis  9. BMI of approximately 38.  Plan: Post op ileus- resolving, regular diet today, tolerated wound vac change well with IV morphine, will plan change again on Thursday but with PO meds only,  Hypokalemia- will replace that today SW IVF's, Cr stable, will keep on some fluids    LOS: 7 days    Etan Vasudevan 11/07/2012

## 2012-11-08 ENCOUNTER — Inpatient Hospital Stay (HOSPITAL_COMMUNITY): Payer: 59

## 2012-11-08 LAB — GLUCOSE, CAPILLARY
Glucose-Capillary: 118 mg/dL — ABNORMAL HIGH (ref 70–99)
Glucose-Capillary: 129 mg/dL — ABNORMAL HIGH (ref 70–99)
Glucose-Capillary: 130 mg/dL — ABNORMAL HIGH (ref 70–99)
Glucose-Capillary: 138 mg/dL — ABNORMAL HIGH (ref 70–99)
Glucose-Capillary: 144 mg/dL — ABNORMAL HIGH (ref 70–99)

## 2012-11-08 LAB — BASIC METABOLIC PANEL
BUN: 13 mg/dL (ref 6–23)
CO2: 26 mEq/L (ref 19–32)
Calcium: 8.8 mg/dL (ref 8.4–10.5)
Chloride: 105 mEq/L (ref 96–112)
Creatinine, Ser: 1.38 mg/dL — ABNORMAL HIGH (ref 0.50–1.10)
GFR calc Af Amer: 50 mL/min — ABNORMAL LOW (ref >90)
GFR calc non Af Amer: 43 mL/min — ABNORMAL LOW (ref >90)
Glucose, Bld: 134 mg/dL — ABNORMAL HIGH (ref 70–99)
Potassium: 3.4 mEq/L — ABNORMAL LOW (ref 3.5–5.1)
Sodium: 141 mEq/L (ref 135–145)

## 2012-11-08 LAB — CBC
HCT: 36.1 % (ref 36.0–46.0)
Hemoglobin: 11.5 g/dL — ABNORMAL LOW (ref 12.0–15.0)
MCH: 29 pg (ref 26.0–34.0)
MCHC: 31.9 g/dL (ref 30.0–36.0)
MCV: 90.9 fL (ref 78.0–100.0)
Platelets: 254 10*3/uL (ref 150–400)
RBC: 3.97 MIL/uL (ref 3.87–5.11)
RDW: 13.1 % (ref 11.5–15.5)
WBC: 8.4 10*3/uL (ref 4.0–10.5)

## 2012-11-08 LAB — PHOSPHORUS: Phosphorus: 4.3 mg/dL (ref 2.3–4.6)

## 2012-11-08 LAB — MAGNESIUM: Magnesium: 1.7 mg/dL (ref 1.5–2.5)

## 2012-11-08 MED ORDER — ONDANSETRON HCL 4 MG/2ML IJ SOLN
4.0000 mg | Freq: Four times a day (QID) | INTRAMUSCULAR | Status: DC | PRN
  Administered 2012-11-08 – 2012-11-11 (×2): 4 mg via INTRAVENOUS
  Filled 2012-11-08 (×3): qty 2

## 2012-11-08 MED ORDER — OXYCODONE HCL 5 MG PO TABS
5.0000 mg | ORAL_TABLET | ORAL | Status: DC | PRN
  Administered 2012-11-08: 10 mg via ORAL
  Administered 2012-11-08 – 2012-11-10 (×7): 15 mg via ORAL
  Administered 2012-11-10: 5 mg via ORAL
  Administered 2012-11-10: 15 mg via ORAL
  Administered 2012-11-11: 5 mg via ORAL
  Administered 2012-11-11: 15 mg via ORAL
  Filled 2012-11-08 (×2): qty 3
  Filled 2012-11-08 (×2): qty 1
  Filled 2012-11-08 (×2): qty 3
  Filled 2012-11-08 (×2): qty 1
  Filled 2012-11-08 (×3): qty 3
  Filled 2012-11-08: qty 2
  Filled 2012-11-08: qty 3
  Filled 2012-11-08: qty 2
  Filled 2012-11-08: qty 1

## 2012-11-08 NOTE — Progress Notes (Signed)
Nutrition Brief Note  RD consulted for nutrition education regarding impaired fasting glucose and obesity.  Lab Results  Component Value Date   HGBA1C 6.2* 11/03/2012    RD provided "10 Tips to a Great Plate" handout from [employer] and "Using Nutrition Labels: Carbohydrates" from the Academy of Nutrition and Dietetics. Discussed different food groups and their effects on blood sugar, emphasizing carbohydrate-containing foods.   Provided examples of ways to balance meals/snacks and encouraged intake of low-sugar, low-fat foods. Pt was nauseous and tired at time of visit but, did listen and voiced some understanding. Pt states her primary concern is feeling better and being healthier. Emphasized how eating healthier and losing weight can help pt be healthier and feel better. Pt reports that she drinks 1 can of ginger ale daily but, is unwilling to cut this out or switch to diet. Pt reports that she can decrease her intake of chocolates and will switch to low fat dairy to help decrease sugar and fat in her diet. Praised pt for these choices and reinforced how doing so will improve her health. Expect good compliance.  Body mass index is 38.94 kg/(m^2). Pt meets criteria for Obesity based on current BMI.  Current diet order is regular, patient is consuming approximately 50 % of meals at this time. Labs and medications reviewed. No further nutrition interventions warranted at this time. RD contact information provided. If additional nutrition issues arise, please re-consult RD.  Ian Malkin RD, LDN Inpatient Clinical Dietitian Pager: [phone] After Hours Pager: [phone]

## 2012-11-08 NOTE — Progress Notes (Signed)
Patient ID: [name], female   DOB: May 27, 1961, 52 y.o.   MRN: [id number] , 7 Days Post-Op  Subjective: Pt reports intermitent significant pain and says the PO meds are not strong enough, colostomy with good output, tolerating diet well, denies n/v.  Objective: Vital signs in last 24 hours: Temp:  [97.8 F (36.6 C)-97.9 F (36.6 C)] 97.8 F (36.6 C) (02/19 0614) Pulse Rate:  [56-75] 70 (02/19 0614) Resp:  [18-20] 20 (02/19 0614) BP: (131-146)/(61-74) 138/68 mmHg (02/19 0614) SpO2:  [87 %-97 %] 97 % (02/19 0614) Last BM Date: 11/07/12  Intake/Output from previous day: 02/18 0701 - 02/19 0700 In: 2035 [P.O.:830; I.V.:1055; IV Piggyback:150] Out: 500 [Urine:500] Intake/Output this shift:    PE: Abd: soft, tender over incisions, +BS, ostomy with stool in bag, wound vac with good seal, wound yesterday was beefy red and healthy General: well developed, NAD  Lab Results:   Recent Labs  11/08/12 0430  WBC 8.4  HGB 11.5*  HCT 36.1  PLT 254   BMET  Recent Labs  11/07/12 0430 11/08/12 0430  NA 143 141  K 3.0* 3.4*  CL 106 105  CO2 27 26  GLUCOSE 119* 134*  BUN 14 13  CREATININE 1.27* 1.38*  CALCIUM 8.5 8.8   PT/INR No results found for this basename: LABPROT, INR,  in the last 72 hours CMP     Component Value Date/Time   NA 141 11/08/2012 0430   K 3.4* 11/08/2012 0430   CL 105 11/08/2012 0430   CO2 26 11/08/2012 0430   GLUCOSE 134* 11/08/2012 0430   BUN 13 11/08/2012 0430   CREATININE 1.38* 11/08/2012 0430   CALCIUM 8.8 11/08/2012 0430   PROT 7.3 10/31/2012 0545   ALBUMIN 3.6 10/31/2012 0545   AST 12 10/31/2012 0545   ALT <5 10/31/2012 0545   ALKPHOS 90 10/31/2012 0545   BILITOT 0.2* 10/31/2012 0545   GFRNONAA 43* 11/08/2012 0430   GFRAA 50* 11/08/2012 0430   Lipase     Component Value Date/Time   LIPASE 38 10/31/2012 0545       Studies/Results: No results found.  Anti-infectives: Anti-infectives   Start     Dose/Rate Route Frequency Ordered Stop    11/05/12 1800  vancomycin (VANCOCIN) 1,500 mg in sodium chloride 0.9 % 500 mL IVPB  Status:  Discontinued     1,500 mg 250 mL/hr over 120 Minutes Intravenous Every 24 hours 11/04/12 1736 11/05/12 1029   11/04/12 1745  vancomycin (VANCOCIN) 500 mg in sodium chloride 0.9 % 100 mL IVPB     500 mg 100 mL/hr over 60 Minutes Intravenous  Once 11/04/12 1735 11/04/12 1929   11/03/12 2000  vancomycin (VANCOCIN) IVPB 1000 mg/200 mL premix  Status:  Discontinued     1,000 mg 200 mL/hr over 60 Minutes Intravenous Every 12 hours 11/03/12 0824 11/04/12 1734   11/03/12 0900  vancomycin (VANCOCIN) 2,000 mg in sodium chloride 0.9 % 500 mL IVPB     2,000 mg 250 mL/hr over 120 Minutes Intravenous  Once 11/03/12 0824 11/03/12 1053   11/03/12 0830  piperacillin-tazobactam (ZOSYN) IVPB 3.375 g     3.375 g 12.5 mL/hr over 240 Minutes Intravenous 3 times per day 11/03/12 0823     11/03/12 0800  levofloxacin (LEVAQUIN) IVPB 750 mg  Status:  Discontinued     750 mg 100 mL/hr over 90 Minutes Intravenous Every 24 hours 11/03/12 0748 11/03/12 0816   11/01/12 1500  cefOXitin (MEFOXIN) 1 g in dextrose  5 % 50 mL IVPB     1 g 100 mL/hr over 30 Minutes Intravenous Every 6 hours 11/01/12 1338 11/02/12 0859   11/01/12 0600  cefOXitin (MEFOXIN) 2 g in dextrose 5 % 50 mL IVPB  Status:  Discontinued     2 g 100 mL/hr over 30 Minutes Intravenous On call to O.R. 10/31/12 1647 11/01/12 1312       Assessment/Plan 1. POD#7: Large parastomal hernia with small bowel obstruction secondary to  adhesions and partial incarceration in the hernia sac. 2. Incisional hernia. 3. Dense intra-abdominal adhesions.  S/P Exploratory laparotomy with closure of parastomal hernia primarily.  2. Extensive lysis of adhesions taking 1 hour.  3. Repair of multiple small incisional hernia, which was primary.  Post op hypoxia with mucus plugging RML AND RLL- Bronchoscopy by DR. SOOD 11/02/12 CXR Shows new infiltrates possible pulmonary edema vs  aspiration pnemonia, bilateral.  Medicine is following with [place] for this 1. 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 Claud Kelp.  2. 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  3. Nonhealing skin ulcer left lower quadrant.  4. Fibromyalgia  5. Anxiety  6. Restless leg syndrome  7. Ongoing tobacco  8. History of nephrolithiasis  9. BMI of approximately 38.  Plan: Post op ileus- resolving, regular diet, will increase PO pain med to help with wound vac change,  Hypokalemia- k replacement in fluids SW IVF's, Cr slightly higher today, will keep on some fluids, UO not recorded strictly   Will ask hospitalist to see patient to help with management of lungs, antibiotics and renal function.   LOS: 8 days    [name] 11/08/2012

## 2012-11-08 NOTE — Progress Notes (Signed)
ANTICOAGULATION FOLLOW-UP CONSULT NOTE  Pharmacy Consult for Lovenox Indication: VTE prophylaxis  Allergies  Allergen Reactions  . Tramadol     Per Dr. Threasa Beards is renal failure    Patient Measurements: Height: 5\' 5"  (165.1 cm) Weight: 234 lb (106.142 kg) IBW/kg (Calculated) : 57  Vital Signs: Temp: 97.8 F (36.6 C) (02/19 0614) Temp src: Oral (02/19 0614) BP: 138/68 mmHg (02/19 0614) Pulse Rate: 70 (02/19 0614)  Labs:  Recent Labs  11/06/12 0334 11/07/12 0430 11/08/12 0430  HGB  --   --  11.5*  HCT  --   --  36.1  PLT  --   --  254  CREATININE 1.22* 1.27* 1.38*    Estimated Creatinine Clearance: 57 ml/min (by C-G formula based on Cr of 1.38).   Medications:  Scheduled:  . albuterol  2.5 mg Nebulization Q6H   And  . ipratropium  0.5 mg Nebulization Q6H  . amitriptyline  25 mg Oral QHS  . budesonide (PULMICORT) nebulizer solution  0.5 mg Nebulization BID  . enoxaparin  40 mg Subcutaneous Q24H  . insulin aspart  0-9 Units Subcutaneous Q6H  . pantoprazole  40 mg Oral Daily  . piperacillin-tazobactam (ZOSYN)  IV  3.375 g Intravenous Q8H  . [COMPLETED] potassium chloride  30 mEq Oral Once  . [COMPLETED] potassium chloride  40 mEq Oral Once  . [DISCONTINUED] albuterol  2.5 mg Nebulization Q4H  . [DISCONTINUED] albuterol  2.5 mg Nebulization Q4H  . [DISCONTINUED] ipratropium  0.5 mg Nebulization Q4H  . [DISCONTINUED] ipratropium  0.5 mg Nebulization Q6H  . [DISCONTINUED] pantoprazole (PROTONIX) IV  40 mg Intravenous Q24H  . [DISCONTINUED] potassium chloride  40 mEq Oral Once   Infusions:  . dextrose 5 % and 0.45 % NaCl with KCl 40 mEq/L 75 mL/hr at 11/07/12 1636   PRN: albuterol, diazepam, HYDROmorphone (DILAUDID) injection, lip balm, ondansetron, oxyCODONE, phenol, [DISCONTINUED] oxyCODONE  Assessment:  10 yof with PSBO want to OR 2/12 for closure parastomal hernia, LOA and wound vac placement  Started on Lovenox 40mg  q24h on 2/14 for post op  VTE prophylaxis, Pharmacy consulted for dosing due to acute rise in SCr.  Ideally should receive Lovenox 50mg  (0.5mg /kg) q 24 hours given size (>100kg) and BMI =33  However, will continue with Lovenox 40mg  SQ q24h due to rising SCr  H/H ok, Pltc stable, no bleeding/complications reported.  Goal of Therapy:  Dose per weight and renal function Monitor platelets by anticoagulation protocol: Yes   Plan:   Continue Lovenox 40mg  SQ q 24 hours.  May consider increasing dose if SCr improves.  Loralee Pacas, PharmD, BCPS Pager: [phone] 11/08/2012,9:07 AM

## 2012-11-08 NOTE — Progress Notes (Signed)
I personally reviewed patient's record, examined the patient, and formulated the following assessment and plan:  The patient states that she is having more nausea today. Her physical exam is relatively unchanged. She is still having ostomy output. We have increased her Zofran interval to every 6 hours.  She will continue to ambulate in the hallway as tolerated.  Creatinine elevated today.  Appreciate Dr Izola Price seeing her.  I will continue her IV fluids for now.  We will recheck in AM.

## 2012-11-08 NOTE — Progress Notes (Signed)
Patient ID: [name], female   DOB: 01/26/1961, 52 y.o.   MRN: [id number]  [facility] PROGRESS NOTE  MARIANNA CID WUJ:[id number] DOB: Feb 09, 1961 DOA: 10/31/2012 PCP: [name], MD  Brief narrative: The patient is a 52 year old female with history of asthma, RLS, fibromyalgia, anxiety, obesity and history of diverticulitis in 2010 resulting in left colectomy and colostomy. She did not return for subsequent reanastomosis to clinic. She developed a parastomal hernia that has been monitored for the last several years and which has been getting bigger. She presented to the ER with a 2-week history of increasing abdominal pain with nausea and vomiting due to possible incarceration of small bowel in the parastomal hernia. She underwent surgery on 11/01/12 with lysis of adhesions and primary correction of the hernia and several other smaller incisional hernias. The patient's hospital course has been complicated by acute on chronic respiratory failure necessitating transfer to the step down unit and evaluation by the pulmonary critical care team. She underwent bronchoscopy 11/02/2012 and has been on therapy for aspiration pneumonia, and has been on antibiotics since 11/03/2012. She also has been treated for mucus plugging.   Impression/Recommendations  Acute exacerbation of COPD with asthma  -Continue empiric antibiotics for treatment of aspiration pneumonia.  -scheduled bronchodilator therapy treatments and when necessary albuterol for wheezing.  -Continue Pulmicort nebulizer treatments twice a day.  -Continue flutter valve and incentive spirometry.  Acute respiratory failure with hypoxia secondary to mucous plugging and atelectasis  -Status post bronchoscopy with removal of mucous plug on 11/02/2012.  -Pt maintaining oxygen saturations at target range  Parastomal hernia  -Status post lysis of adhesions and primary closure of hernias Per surgery.  -Patient has a a colostomy.   Fibromyalgia  -Continue pain medications as needed.  Generalized anxiety disorder  -Continue Valium as needed.  Restless legs syndrome  -Deferred definitive treatment of the patient's primary care doctor.  Obesity (BMI 35.0-39.9 without comorbidity)  -Appreciate nutritionist recommendations  Leukocytosis, unspecified  -White blood cell count within normal limits this AM Hyperglycemia  -Hemoglobin A1c is 6.2%, consistent with impaired fasting glucose.  -appreciate nutrition consultation Cigarette nicotine dependence with withdrawal  -RN to provide tobacco cessation information.  HAP (hospital-acquired pneumonia)  -Initially treated with vancomycin and Zosyn. Now on Zosyn monotherapy.  -Zosyn stop date 11/10/2102 Acute pulmonary edema  -Diuresis. Current respiratory status stable.  Acute renal failure  -Baseline creatinine 0.66. Creatinine reached a high of 1.65 before beginning to trend down. Creatinine still elevated. BMP in AM Hypokalemia  -Potassium replaced by primary team. BMP in AM.  Procedures/Studies: Dg Chest 2 View 11/08/2012   1.  Low lung volumes with mild diffuse bilateral air space disease, worst at the lung bases.  2.  Small bilateral pleural effusions.    Antibiotics:  Zosyn 2/14 -->   Code Status: Full Family Communication: Pt at bedside Disposition Plan: Home when medically stable  HPI/Subjective: No events overnight.   Objective: Filed Vitals:   11/08/12 0147 11/08/12 0614 11/08/12 1002 11/08/12 1521  BP:  138/68  138/69  Pulse:  70  75  Temp:  97.8 F (36.6 C)  98.1 F (36.7 C)  TempSrc:  Oral  Oral  Resp:  20  20  Height:      Weight:      SpO2: 94% 97% 95% 97%    Intake/Output Summary (Last 24 hours) at 11/08/12 1529 Last data filed at 11/08/12 1523  Gross per 24 hour  Intake   2105 ml  Output    500 ml  Net   1605 ml    Exam:   General:  Pt is alert, follows commands appropriately, not in acute distress  Cardiovascular:  Regular rate and rhythm, S1/S2, no murmurs, no rubs, no gallops  Respiratory: Clear to auscultation bilaterally, no wheezing, no crackles, no rhonchi  Abdomen: Soft, tender around incision site, non distended, bowel sounds present, wound vac in place  Extremities: No edema, pulses DP and PT palpable bilaterally  Neuro: Grossly nonfocal  Data Reviewed: Basic Metabolic Panel:  Recent Labs Lab 11/04/12 0210 11/04/12 1550 11/05/12 0341 11/06/12 0334 11/07/12 0430 11/08/12 0430  NA 141 140 142 140 143 141  K 3.2* 3.4* 3.3* 3.4* 3.0* 3.4*  CL 101 99 104 107 106 105  CO2 30 30 27 24 27 26   GLUCOSE 101* 117* 105* 98 119* 134*  BUN 17 23 21 18 14 13   CREATININE 1.40* 1.65* 1.49* 1.22* 1.27* 1.38*  CALCIUM 9.0 9.0 8.6 8.5 8.5 8.8  MG 1.8  --  2.0 1.9 1.8 1.7  PHOS  --   --  3.4 3.5 3.2 4.3   CBC:  Recent Labs Lab 11/02/12 0428 11/03/12 0337 11/04/12 0210 11/08/12 0430  WBC 19.1* 17.6* 12.8* 8.4  HGB 15.0 14.6 13.8 11.5*  HCT 45.1 43.4 41.7 36.1  MCV 89.0 90.0 90.5 90.9  PLT 251 223 230 254   Cardiac Enzymes:  Recent Labs Lab 11/02/12 1850 11/02/12 2050 11/02/12 2358  TROPONINI <0.30 <0.30 <0.30   BNP: No components found with this basename: POCBNP,  CBG:  Recent Labs Lab 11/07/12 1638 11/07/12 2059 11/07/12 2357 11/08/12 0611 11/08/12 1141  GLUCAP 127* 155* 144* 118* 129*    Recent Results (from the past 240 hour(s))  URINE CULTURE     Status: None   Collection Time    10/31/12  7:20 AM      Result Value Range Status   Specimen Description URINE, CLEAN CATCH   Final   Special Requests NONE   Final   Culture  Setup Time 10/31/2012 15:12   Final   Colony Count NO GROWTH   Final   Culture NO GROWTH   Final   Report Status 11/01/2012 FINAL   Final  SURGICAL PCR SCREEN     Status: None   Collection Time    11/01/12  5:22 AM      Result Value Range Status   MRSA, PCR NEGATIVE  NEGATIVE Final   Staphylococcus aureus NEGATIVE  NEGATIVE Final    Comment:            The Xpert SA Assay (FDA     approved for NASAL specimens     in patients over 12 years of age),     is one component of     a comprehensive surveillance     program.  Test performance has     been validated by [employer] for patients greater     than or equal to 43 year old.     It is not intended     to diagnose infection nor to     guide or monitor treatment.  CULTURE, BAL-QUANTITATIVE     Status: None   Collection Time    11/02/12  3:05 PM      Result Value Range Status   Specimen Description BRONCHIAL ALVEOLAR LAVAGE   Final   Special Requests NONE   Final   Gram Stain  Final   Value: ABUNDANT WBC PRESENT, PREDOMINANTLY PMN     NO SQUAMOUS EPITHELIAL CELLS SEEN     RARE GRAM POSITIVE COCCI     IN PAIRS IN CHAINS   Colony Count >=100,000 COLONIES/ML   Final   Culture Non-Pathogenic Oropharyngeal-type Flora Isolated.   Final   Report Status 11/05/2012 FINAL   Final     Scheduled Meds: . albuterol  2.5 mg Nebulization Q6H   And  . ipratropium  0.5 mg Nebulization Q6H  . amitriptyline  25 mg Oral QHS  . budesonide (PULMICORT) nebulizer solution  0.5 mg Nebulization BID  . enoxaparin  40 mg Subcutaneous Q24H  . insulin aspart  0-9 Units Subcutaneous Q6H  . pantoprazole  40 mg Oral Daily  . piperacillin-tazobactam (ZOSYN)  IV  3.375 g Intravenous Q8H   Continuous Infusions: . dextrose 5 % and 0.45 % NaCl with KCl 40 mEq/L 75 mL/hr at 11/07/12 1636     Debbora Presto, MD  [facility] Pager [phone]  If 7PM-7AM, please contact night-coverage [email or web address] Password [email or web address] 11/08/2012, 3:29 PM   LOS: 8 days

## 2012-11-08 NOTE — Progress Notes (Signed)
Talked to patient about HHC choices, patient requested [facility] because she has used them in the past and wants to use them again; Doristine Mango [facility] called concerning wound vac, if she wanted [facility] or [facility] to take care of the wound vac- CM was informed that she wanted [facility] to take care of the wound vac; [facility] contacted, paperwork placed on the patient's chart for the Attending MD to complete. Abelino Derrick RN,BSN,MHA

## 2012-11-09 ENCOUNTER — Inpatient Hospital Stay (HOSPITAL_COMMUNITY): Payer: 59

## 2012-11-09 LAB — GLUCOSE, CAPILLARY
Glucose-Capillary: 110 mg/dL — ABNORMAL HIGH (ref 70–99)
Glucose-Capillary: 118 mg/dL — ABNORMAL HIGH (ref 70–99)
Glucose-Capillary: 122 mg/dL — ABNORMAL HIGH (ref 70–99)
Glucose-Capillary: 133 mg/dL — ABNORMAL HIGH (ref 70–99)
Glucose-Capillary: 93 mg/dL (ref 70–99)

## 2012-11-09 LAB — BASIC METABOLIC PANEL
BUN: 12 mg/dL (ref 6–23)
CO2: 27 mEq/L (ref 19–32)
Calcium: 8.6 mg/dL (ref 8.4–10.5)
Chloride: 108 mEq/L (ref 96–112)
Creatinine, Ser: 1.42 mg/dL — ABNORMAL HIGH (ref 0.50–1.10)
GFR calc Af Amer: 48 mL/min — ABNORMAL LOW (ref >90)
GFR calc non Af Amer: 41 mL/min — ABNORMAL LOW (ref >90)
Glucose, Bld: 121 mg/dL — ABNORMAL HIGH (ref 70–99)
Potassium: 3.3 mEq/L — ABNORMAL LOW (ref 3.5–5.1)
Sodium: 143 mEq/L (ref 135–145)

## 2012-11-09 LAB — PHOSPHORUS: Phosphorus: 5.1 mg/dL — ABNORMAL HIGH (ref 2.3–4.6)

## 2012-11-09 LAB — CBC
HCT: 33.6 % — ABNORMAL LOW (ref 36.0–46.0)
Hemoglobin: 11.1 g/dL — ABNORMAL LOW (ref 12.0–15.0)
MCH: 29.9 pg (ref 26.0–34.0)
MCHC: 33 g/dL (ref 30.0–36.0)
MCV: 90.6 fL (ref 78.0–100.0)
Platelets: 240 10*3/uL (ref 150–400)
RBC: 3.71 MIL/uL — ABNORMAL LOW (ref 3.87–5.11)
RDW: 13.2 % (ref 11.5–15.5)
WBC: 8.2 10*3/uL (ref 4.0–10.5)

## 2012-11-09 LAB — MAGNESIUM: Magnesium: 1.6 mg/dL (ref 1.5–2.5)

## 2012-11-09 MED ORDER — SODIUM CHLORIDE 0.9 % IV BOLUS (SEPSIS)
500.0000 mL | Freq: Once | INTRAVENOUS | Status: AC
  Administered 2012-11-09: 500 mL via INTRAVENOUS

## 2012-11-09 NOTE — Progress Notes (Signed)
SATURATION QUALIFICATIONS: (This note is used to comply with regulatory documentation for home oxygen)  Patient Saturations on Room Air at Rest =88%  Patient Saturations on Room Air while Ambulating =86%  Patient Saturations on 3 Liters of oxygen while Ambulating =94%  Please briefly explain why patient needs home oxygen:

## 2012-11-09 NOTE — Progress Notes (Signed)
Paperwork / clinical information faxed to [facility] for approval of wound vac for home; Alexis Goodell [phone]

## 2012-11-09 NOTE — Progress Notes (Signed)
Patient ID: [name], female   DOB: 20-Dec-1960, 52 y.o.   MRN: [id number]  [facility] PROGRESS NOTE  [name] WUJ:[id number] DOB: Nov 12, 1960 DOA: 10/31/2012 PCP: [name], MD  Brief narrative:  The patient is a 52 year old female with history of asthma, RLS, fibromyalgia, anxiety, obesity and history of diverticulitis in 2010 resulting in left colectomy and colostomy. She did not return for subsequent reanastomosis to clinic. She developed a parastomal hernia that has been monitored for the last several years and which has been getting bigger. She presented to the ER with a 2-week history of increasing abdominal pain with nausea and vomiting due to possible incarceration of small bowel in the parastomal hernia. She underwent surgery on 11/01/12 with lysis of adhesions and primary correction of the hernia and several other smaller incisional hernias. The patient's hospital course has been complicated by acute on chronic respiratory failure necessitating transfer to the step down unit and evaluation by the pulmonary critical care team. She underwent bronchoscopy 11/02/2012 and has been on therapy for aspiration pneumonia, and has been on antibiotics since 11/03/2012. She also has been treated for mucus plugging.   Impression/Recommendations  Acute exacerbation of COPD with asthma, HCAP  - Continue Zosyn, stop date on Zosyn 11/10/2012 - scheduled bronchodilator therapy treatments and when necessary albuterol for wheezing.  Acute respiratory failure with hypoxia secondary to mucous plugging and atelectasis  - Status post bronchoscopy with removal of mucous plug on 11/02/2012.  Parastomal hernia  - Status post lysis of adhesions and primary closure of hernia Leukocytosis, unspecified  - White blood cell count within normal limits this AM  Hyperglycemia  - Hemoglobin A1c is 6.2%, consistent with impaired fasting glucose.  - Diet discussed with patient Acute renal failure   - Baseline creatinine 0.66. Creatinine reached a high of 1.65 before beginning to trend down. Creatinine still elevated.  - will give bolus NS today, renal US, BMP in AM Hypokalemia  - Potassium still low, continued to be supplemented, by primary  Procedures/Studies:  Dg Chest 2 View  11/08/2012  1. Low lung volumes with mild diffuse bilateral air space disease, worst at the lung bases.  2. Small bilateral pleural effusions.  Antibiotics:  Zosyn 2/14 --> 11/10/2012  Code Status: Full  Family Communication: Pt at bedside  Disposition Plan: Home when medically stable    HPI/Subjective: No events overnight.   Objective: Filed Vitals:   11/08/12 2011 11/08/12 2203 11/09/12 0118 11/09/12 0439  BP:  104/51  98/53  Pulse:  72  64  Temp:  97.9 F (36.6 C)  97.4 F (36.3 C)  TempSrc:  Oral  Oral  Resp:  18  18  Height:      Weight:      SpO2: 98% 96% 96% 97%    Intake/Output Summary (Last 24 hours) at 11/09/12 1246 Last data filed at 11/09/12 9562  Gross per 24 hour  Intake 2753.75 ml  Output    100 ml  Net 2653.75 ml    Exam:   General:  Pt is alert, follows commands appropriately, not in acute distress  Cardiovascular: Regular rate and rhythm, S1/S2, no murmurs, no rubs, no gallops  Respiratory: Clear to auscultation bilaterally, no wheezing, no crackles, no rhonchi  Abdomen: Soft, tender around incision site, non distended, bowel sounds present, no guarding, ostomy bag with good output   Extremities: No edema, pulses DP and PT palpable bilaterally  Neuro: Grossly nonfocal  Data Reviewed: Basic Metabolic Panel:  Recent  Labs Lab 11/05/12 0341 11/06/12 0334 11/07/12 0430 11/08/12 0430 11/09/12 0300  NA 142 140 143 141 143  K 3.3* 3.4* 3.0* 3.4* 3.3*  CL 104 107 106 105 108  CO2 27 24 27 26 27   GLUCOSE 105* 98 119* 134* 121*  BUN 21 18 14 13 12   CREATININE 1.49* 1.22* 1.27* 1.38* 1.42*  CALCIUM 8.6 8.5 8.5 8.8 8.6  MG 2.0 1.9 1.8 1.7 1.6  PHOS 3.4  3.5 3.2 4.3 5.1*   Liver Function Tests: No results found for this basename: AST, ALT, ALKPHOS, BILITOT, PROT, ALBUMIN,  in the last 168 hours No results found for this basename: LIPASE, AMYLASE,  in the last 168 hours No results found for this basename: AMMONIA,  in the last 168 hours CBC:  Recent Labs Lab 11/03/12 0337 11/04/12 0210 11/08/12 0430 11/09/12 0300  WBC 17.6* 12.8* 8.4 8.2  HGB 14.6 13.8 11.5* 11.1*  HCT 43.4 41.7 36.1 33.6*  MCV 90.0 90.5 90.9 90.6  PLT 223 230 254 240   Cardiac Enzymes:  Recent Labs Lab 11/02/12 1850 11/02/12 2050 11/02/12 2358  TROPONINI <0.30 <0.30 <0.30   BNP: No components found with this basename: POCBNP,  CBG:  Recent Labs Lab 11/08/12 1628 11/09/12 0001 11/09/12 0612 11/09/12 0807 11/09/12 1155  GLUCAP 130* 138* 133* 118* 110*    Recent Results (from the past 240 hour(s))  URINE CULTURE     Status: None   Collection Time    10/31/12  7:20 AM      Result Value Range Status   Specimen Description URINE, CLEAN CATCH   Final   Special Requests NONE   Final   Culture  Setup Time 10/31/2012 15:12   Final   Colony Count NO GROWTH   Final   Culture NO GROWTH   Final   Report Status 11/01/2012 FINAL   Final  SURGICAL PCR SCREEN     Status: None   Collection Time    11/01/12  5:22 AM      Result Value Range Status   MRSA, PCR NEGATIVE  NEGATIVE Final   Staphylococcus aureus NEGATIVE  NEGATIVE Final   Comment:            The Xpert SA Assay (FDA     approved for NASAL specimens     in patients over 37 years of age),     is one component of     a comprehensive surveillance     program.  Test performance has     been validated by [employer] for patients greater     than or equal to 49 year old.     It is not intended     to diagnose infection nor to     guide or monitor treatment.  CULTURE, BAL-QUANTITATIVE     Status: None   Collection Time    11/02/12  3:05 PM      Result Value Range Status   Specimen  Description BRONCHIAL ALVEOLAR LAVAGE   Final   Special Requests NONE   Final   Gram Stain     Final   Value: ABUNDANT WBC PRESENT, PREDOMINANTLY PMN     NO SQUAMOUS EPITHELIAL CELLS SEEN     RARE GRAM POSITIVE COCCI     IN PAIRS IN CHAINS   Colony Count >=100,000 COLONIES/ML   Final   Culture Non-Pathogenic Oropharyngeal-type Flora Isolated.   Final   Report Status 11/05/2012 FINAL   Final  Scheduled Meds: . albuterol  2.5 mg Nebulization Q6H   And  . ipratropium  0.5 mg Nebulization Q6H  . amitriptyline  25 mg Oral QHS  . budesonide (PULMICORT) nebulizer solution  0.5 mg Nebulization BID  . enoxaparin  40 mg Subcutaneous Q24H  . insulin aspart  0-9 Units Subcutaneous Q6H  . pantoprazole  40 mg Oral Daily  . piperacillin-tazobactam (ZOSYN)  IV  3.375 g Intravenous Q8H   Continuous Infusions: . dextrose 5 % and 0.45 % NaCl with KCl 40 mEq/L 75 mL/hr at 11/08/12 2010     Debbora Presto, MD  [facility] Pager [phone]  If 7PM-7AM, please contact night-coverage [email or web address] Password [facility] 11/09/2012, 12:46 PM   LOS: 9 days

## 2012-11-09 NOTE — Progress Notes (Signed)
ATTENDING ADDENDUM:  I personally reviewed patient's record, examined the patient, and formulated the following assessment and plan:  Doing well.  Tolerated dressing change.  Cr still elevated.  Will defer management to hospitalist.  Can d/c once cleared medically.

## 2012-11-09 NOTE — Progress Notes (Signed)
ANTIBIOTIC CONSULT NOTE - FOLLOW UP  Pharmacy Consult for Zosyn Indication: pneumonia  Allergies  Allergen Reactions  . Tramadol     Per Dr. Threasa Beards is renal failure    Patient Measurements: Height: 5\' 5"  (165.1 cm) Weight: 234 lb (106.142 kg) IBW/kg (Calculated) : 57  Vital Signs: Temp: 97.4 F (36.3 C) (02/20 0439) Temp src: Oral (02/20 0439) BP: 98/53 mmHg (02/20 0439) Pulse Rate: 64 (02/20 0439)  Labs:  Recent Labs  11/07/12 0430 11/08/12 0430 11/09/12 0300  WBC  --  8.4 8.2  HGB  --  11.5* 11.1*  PLT  --  254 240  CREATININE 1.27* 1.38* 1.42*   Estimated Creatinine Clearance: 55.4 ml/min (by C-G formula based on Cr of 1.42).  Microbiology: Recent Results (from the past 720 hour(s))  URINE CULTURE     Status: None   Collection Time    10/31/12  7:20 AM      Result Value Range Status   Specimen Description URINE, CLEAN CATCH   Final   Special Requests NONE   Final   Culture  Setup Time 10/31/2012 15:12   Final   Colony Count NO GROWTH   Final   Culture NO GROWTH   Final   Report Status 11/01/2012 FINAL   Final  SURGICAL PCR SCREEN     Status: None   Collection Time    11/01/12  5:22 AM      Result Value Range Status   MRSA, PCR NEGATIVE  NEGATIVE Final   Staphylococcus aureus NEGATIVE  NEGATIVE Final   Comment:            The Xpert SA Assay (FDA     approved for NASAL specimens     in patients over 54 years of age),     is one component of     a comprehensive surveillance     program.  Test performance has     been validated by [employer] for patients greater     than or equal to 69 year old.     It is not intended     to diagnose infection nor to     guide or monitor treatment.  CULTURE, BAL-QUANTITATIVE     Status: None   Collection Time    11/02/12  3:05 PM      Result Value Range Status   Specimen Description BRONCHIAL ALVEOLAR LAVAGE   Final   Special Requests NONE   Final   Gram Stain     Final   Value: ABUNDANT WBC  PRESENT, PREDOMINANTLY PMN     NO SQUAMOUS EPITHELIAL CELLS SEEN     RARE GRAM POSITIVE COCCI     IN PAIRS IN CHAINS   Colony Count >=100,000 COLONIES/ML   Final   Culture Non-Pathogenic Oropharyngeal-type Flora Isolated.   Final   Report Status 11/05/2012 FINAL   Final    Anti-Infectives: 2/12 >> Cefoxitin >> 2/13 2/14 >> Vanc   >> 2/16 2/14 >> Zosyn  >>    Assessment: 52 yof s/p exploratory laparotomy w/ closure of parastomal hernia, lysis of adhesions on D#7/7 Zosyn extended infusion for PNA.   Renal function: SCr slow trend up, current CrCL ~55 ml/min.  Dose of zosyn remains ok.  Afebrile  WBC WNL  BAL, urine cultures negative  Goal of Therapy:  Appropriate abx dosing, eradication of infection.   Plan:   Continue Zosyn 3.375g IV Q8H infused over 4hrs.  Noted plan for  7 days of abx therapy  Follow up renal function, stop date for zosyn  Haynes Hoehn, PharmD 11/09/2012 12:17 PM  Pager: [phone]

## 2012-11-09 NOTE — Progress Notes (Signed)
HHC arranged with [facility]; need paperwork to be completed by Medical team for approval for wound vac for home. Paperwork on patient's chart. Once paperwork is completed, it will be faxed off for approval. [name] [phone]

## 2012-11-09 NOTE — Progress Notes (Signed)
Physical Therapy Treatment Patient Details Name: [name] MRN: [id number] DOB: 02/15/1961 Today's Date: 11/09/2012 Time: 4098-1191 PT Time Calculation (min): 18 min  PT Assessment / Plan / Recommendation Comments on Treatment Session  Pt progressing well with mobility, however requires 3L O2 to maintain sats in 90's.  See ambulation section for O2 details.  Pt states she has no stairs at home (now going to mothers house).  PT to sign off on pt at this time.     Follow Up Recommendations  No PT follow up     Does the patient have the potential to tolerate intense rehabilitation     Barriers to Discharge        Equipment Recommendations  None recommended by PT    Recommendations for Other Services    Frequency Min 3X/week   Plan Discharge plan remains appropriate    Precautions / Restrictions Precautions Precaution Comments: monitor O2 Restrictions Weight Bearing Restrictions: No   Pertinent Vitals/Pain Pt continues to have increased pain in abdomen.  Was premedicated.     Mobility  Transfers Transfers: Sit to Stand;Stand to Sit Sit to Stand: 7: Independent Stand to Sit: 7: Independent Ambulation/Gait Ambulation/Gait Assistance: 5: Supervision;6: Modified independent (Device/Increase time) Ambulation Distance (Feet): 250 Feet Assistive device: None Ambulation/Gait Assistance Details: Requires increased time for ambulation and rest breaks needed when SOB. Min cues for pursed lip breathing.  Noted pt has tendency to hold breath or get into conversation and get SOB.  Upon PT entering room, pt was on RA with SaO2 at 86-88%.  Applied 2L O2 and ambulated with SaO2 still in upper 80's.  Increased O2 to 3L with SaO2 improving to 94%.  RN aware.  Gait Pattern: Within Functional Limits;Trunk flexed Gait velocity: decreased to manage O2 sats    Exercises General Exercises - Lower Extremity Ankle Circles/Pumps: AROM;Both;20 reps Quad Sets: Strengthening;Both;15 reps Heel  Slides: Strengthening;Both;10 reps   PT Diagnosis:    PT Problem List:   PT Treatment Interventions:     PT Goals Acute Rehab PT Goals PT Goal Formulation: With patient Time For Goal Achievement: 11/21/12 Potential to Achieve Goals: Good Pt will Ambulate: >150 feet;Independently;Other (comment) PT Goal: Ambulate - Progress: Progressing toward goal  Visit Information  Last PT Received On: 11/09/12 Assistance Needed: +1    Subjective Data  Subjective: I'm pretty strong.  I'm on my feet all day at work.  Patient Stated Goal: to go home with family   Cognition  Cognition Overall Cognitive Status: Appears within functional limits for tasks assessed/performed Arousal/Alertness: Awake/alert Orientation Level: Appears intact for tasks assessed Behavior During Session: [facility] for tasks performed    Balance     End of Session PT - End of Session Equipment Utilized During Treatment: Oxygen Activity Tolerance: Patient limited by fatigue;Patient tolerated treatment well Patient left: in chair;with call bell/phone within reach Nurse Communication: Mobility status   GP     Vista Deck 11/09/2012, 1:31 PM

## 2012-11-09 NOTE — Progress Notes (Signed)
8 Days Post-Op  Subjective: She is sitting in bed with O2, no complaints.  Tolerating regular diet.  Colostomy working well.  Objective: Vital signs in last 24 hours: Temp:  [97.4 F (36.3 C)-98.1 F (36.7 C)] 97.4 F (36.3 C) (02/20 0439) Pulse Rate:  [64-75] 64 (02/20 0439) Resp:  [18-20] 18 (02/20 0439) BP: (98-138)/(51-69) 98/53 mmHg (02/20 0439) SpO2:  [95 %-98 %] 97 % (02/20 0439) Last BM Date: 11/08/12 Afebrile, VSS, BP down some this AM, K+ low at 3.3, Creatinine remains elevated, Diet regular,  Intake/Output from previous day: 02/19 0701 - 02/20 0700 In: 2873.8 [P.O.:930; I.V.:1793.8; IV Piggyback:150] Out: 600 [Urine:500; Stool:100] Intake/Output this shift:    General appearance: alert, cooperative and no distress Resp: BS down some at bases, no wheezing some rales,  GI: soft, +BS, +BM in ostomy along with gas.  Wound vac in place.  Lab Results:   Recent Labs  11/08/12 0430 11/09/12 0300  WBC 8.4 8.2  HGB 11.5* 11.1*  HCT 36.1 33.6*  PLT 254 240    BMET  Recent Labs  11/08/12 0430 11/09/12 0300  NA 141 143  K 3.4* 3.3*  CL 105 108  CO2 26 27  GLUCOSE 134* 121*  BUN 13 12  CREATININE 1.38* 1.42*  CALCIUM 8.8 8.6   PT/INR No results found for this basename: LABPROT, INR,  in the last 72 hours  No results found for this basename: AST, ALT, ALKPHOS, BILITOT, PROT, ALBUMIN,  in the last 168 hours   Lipase     Component Value Date/Time   LIPASE 38 10/31/2012 0545     Studies/Results: Dg Chest 2 View  11/08/2012  *RADIOLOGY REPORT*  Clinical Data: Post treatment for hypoxia/pneumonia.  CHEST - 2 VIEW  Comparison: 11/04/2012 and CT chest 11/02/2012.  Findings: Trachea is midline.  Heart size normal.  Lungs are low in volume with mild diffuse bilateral air space disease, bibasilar predominant.  Biapical pleural thickening.  Small bilateral pleural effusions.  IMPRESSION:  1.  Low lung volumes with mild diffuse bilateral air space disease, worst at  the lung bases. 2.  Small bilateral pleural effusions.   Original Report Authenticated By: Leanna Battles, M.D.     Medications: . albuterol  2.5 mg Nebulization Q6H   And  . ipratropium  0.5 mg Nebulization Q6H  . amitriptyline  25 mg Oral QHS  . budesonide (PULMICORT) nebulizer solution  0.5 mg Nebulization BID  . enoxaparin  40 mg Subcutaneous Q24H  . insulin aspart  0-9 Units Subcutaneous Q6H  . pantoprazole  40 mg Oral Daily  . piperacillin-tazobactam (ZOSYN)  IV  3.375 g Intravenous Q8H    Assessment/Plan 1. POD#7: Large parastomal hernia with small bowel obstruction secondary to  adhesions and partial incarceration in the hernia sac. 2. Incisional hernia. 3. Dense intra-abdominal adhesions.  S/P Exploratory laparotomy with closure of parastomal hernia primarily.  2. Extensive lysis of adhesions taking 1 hour. 11/01/12 Dr.Cornett 3. Repair of multiple small incisional hernia, which was primary.  Post op hypoxia with mucus plugging RML AND RLL- Bronchoscopy by DR. SOOD 11/02/12  CXR Shows new infiltrates possible pulmonary edema vs aspiration pnemonia, bilateral.  Post op Renal insuffiencey  Medicine is following with [place] for this  1. 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 Claud Kelp.  2. 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  3. Nonhealing skin ulcer left lower quadrant.  4. Fibromyalgia  5. Anxiety  6. Restless leg syndrome  7. Ongoing tobacco  8. History of nephrolithiasis  9. BMI of approximately 38.   Plan:  Dr Izola Price is going to check a renal ultrasound and give her a fluid bolus for her BP.  She also needs some K+ replacement.  Tomorrow will be her last day of antibiotics for her pneumonia.  Pending Dr. Izola Price recommendation she may be able to go tomorrow.  Dr. Clelia Croft is her primary care.   Wound Vac taken down and she is healing well, but one side is  significantly higher than the other.  I am going to leave it open till Dr. Maisie Fus sees it. I did open it some and there are some areas of the fat looking less viable at the base.  Wound measures 20 x 4 x 5  LOS: 9 days    Ahmeer Tuman 11/09/2012

## 2012-11-10 LAB — BASIC METABOLIC PANEL
BUN: 13 mg/dL (ref 6–23)
CO2: 26 mEq/L (ref 19–32)
Calcium: 8.6 mg/dL (ref 8.4–10.5)
Chloride: 104 mEq/L (ref 96–112)
Creatinine, Ser: 1.48 mg/dL — ABNORMAL HIGH (ref 0.50–1.10)
GFR calc Af Amer: 46 mL/min — ABNORMAL LOW (ref >90)
GFR calc non Af Amer: 39 mL/min — ABNORMAL LOW (ref >90)
Glucose, Bld: 131 mg/dL — ABNORMAL HIGH (ref 70–99)
Potassium: 3.5 mEq/L (ref 3.5–5.1)
Sodium: 140 mEq/L (ref 135–145)

## 2012-11-10 LAB — GLUCOSE, CAPILLARY
Glucose-Capillary: 117 mg/dL — ABNORMAL HIGH (ref 70–99)
Glucose-Capillary: 124 mg/dL — ABNORMAL HIGH (ref 70–99)
Glucose-Capillary: 129 mg/dL — ABNORMAL HIGH (ref 70–99)
Glucose-Capillary: 142 mg/dL — ABNORMAL HIGH (ref 70–99)
Glucose-Capillary: 144 mg/dL — ABNORMAL HIGH (ref 70–99)

## 2012-11-10 LAB — MAGNESIUM: Magnesium: 1.7 mg/dL (ref 1.5–2.5)

## 2012-11-10 LAB — PHOSPHORUS: Phosphorus: 4.6 mg/dL (ref 2.3–4.6)

## 2012-11-10 MED ORDER — ACETAMINOPHEN 325 MG PO TABS: ORAL_TABLET | ORAL | Status: DC

## 2012-11-10 MED ORDER — POLYETHYLENE GLYCOL 3350 17 G PO PACK
17.0000 g | PACK | Freq: Every day | ORAL | Status: DC
  Administered 2012-11-10 – 2012-11-11 (×2): 17 g via ORAL
  Filled 2012-11-10 (×2): qty 1

## 2012-11-10 MED ORDER — PSYLLIUM 95 % PO PACK: 1.0000 | PACK | Freq: Every day | ORAL | Status: DC

## 2012-11-10 MED ORDER — DIAZEPAM 10 MG PO TABS: ORAL_TABLET | ORAL | Status: DC

## 2012-11-10 MED ORDER — POLYETHYLENE GLYCOL 3350 17 G PO PACK: PACK | ORAL | Status: DC

## 2012-11-10 MED ORDER — BUDESONIDE 0.5 MG/2ML IN SUSP: 0.5000 mg | Freq: Two times a day (BID) | RESPIRATORY_TRACT | Status: DC

## 2012-11-10 MED ORDER — SODIUM CHLORIDE 0.9 % IV BOLUS (SEPSIS)
1000.0000 mL | Freq: Once | INTRAVENOUS | Status: AC
  Administered 2012-11-10: 1000 mL via INTRAVENOUS

## 2012-11-10 MED ORDER — DOCUSATE SODIUM 100 MG PO CAPS
100.0000 mg | ORAL_CAPSULE | Freq: Every day | ORAL | Status: DC | PRN
  Filled 2012-11-10: qty 1

## 2012-11-10 MED ORDER — DSS 100 MG PO CAPS: ORAL_CAPSULE | ORAL | Status: DC

## 2012-11-10 MED ORDER — OXYCODONE HCL 5 MG PO TABS: 5.0000 mg | ORAL_TABLET | ORAL | Status: DC | PRN

## 2012-11-10 MED ORDER — PSYLLIUM 95 % PO PACK
1.0000 | PACK | Freq: Every day | ORAL | Status: DC
  Administered 2012-11-10 – 2012-11-11 (×2): 1 via ORAL
  Filled 2012-11-10 (×2): qty 1

## 2012-11-10 NOTE — Progress Notes (Signed)
Patient ID: [name], female   DOB: [birth year], 52 y.o.   MRN: [id number]  [facility] PROGRESS NOTE  BRINKLEY PEET NWG:[id number] DOB: 06-Jul-1961 DOA: 10/31/2012 PCP: [name], MD  Brief narrative:  The patient is a 52 year old female with history of asthma, RLS, fibromyalgia, anxiety, obesity and history of diverticulitis in 2010 resulting in left colectomy and colostomy. She did not return for subsequent reanastomosis to clinic. She developed a parastomal hernia that has been monitored for the last several years and which has been getting bigger. She presented to the ER with a 2-week history of increasing abdominal pain with nausea and vomiting due to possible incarceration of small bowel in the parastomal hernia. She underwent surgery on 11/01/12 with lysis of adhesions and primary correction of the hernia and several other smaller incisional hernias. The patient's hospital course has been complicated by acute on chronic respiratory failure necessitating transfer to the step down unit and evaluation by the pulmonary critical care team. She underwent bronchoscopy 11/02/2012 and has been on therapy for aspiration pneumonia, and has been on antibiotics since 11/03/2012. She also has been treated for mucus plugging.   Impression/Recommendations  Acute exacerbation of COPD with asthma, HCAP  - Will d/c Zosyn - scheduled bronchodilator therapy treatments and when necessary albuterol for wheezing.  Acute respiratory failure with hypoxia secondary to mucous plugging and atelectasis  - Status post bronchoscopy with removal of mucous plug on 11/02/2012.  - clinically stable from respiratory standpoint, maintaining oxygen saturations at target range  Parastomal hernia  - Status post lysis of adhesions and primary closure of hernia  Leukocytosis, unspecified  - White blood cell count within normal limits this AM  Hyperglycemia  - Hemoglobin A1c is 6.2%, consistent with impaired  fasting glucose.  - Diet discussed with patient  Acute renal failure  - creatinine slightly up from yesterday, renal US unremarkable  - discussed with Dr. Caryn Section, will proceed with obtaining SPEP, UPEP, ANA, C3, C4 level - give additional bolus of NS today, repeat BMP in AM - per nephrologist, an outpatient follow up reasonable Hypokalemia  - within normal limits this AM  Procedures/Studies:  US Renal 11/09/2012  Negative for hydronephrosis.  Normal-appearing kidneys.   Original Report Authenticated By: Holley Dexter, M.D.   Dg Chest 2 View  11/08/2012  1. Low lung volumes with mild diffuse bilateral air space disease, worst at the lung bases.  2. Small bilateral pleural effusions.  Antibiotics:  Zosyn 2/14 --> 11/10/2012  Code Status: Full  Family Communication: Pt at bedside  Disposition Plan: Home when medically stable   HPI/Subjective: No events overnight.   Objective: Filed Vitals:   11/09/12 2112 11/10/12 0144 11/10/12 0448 11/10/12 0819  BP: 125/78  97/50   Pulse: 87  60   Temp: 98.3 F (36.8 C)  97.8 F (36.6 C)   TempSrc: Oral  Oral   Resp: 18  18   Height:      Weight:      SpO2: 99% 98% 94% 93%    Intake/Output Summary (Last 24 hours) at 11/10/12 1006 Last data filed at 11/10/12 5784  Gross per 24 hour  Intake   2585 ml  Output      0 ml  Net   2585 ml    Exam:   General:  Pt is alert, follows commands appropriately, not in acute distress  Cardiovascular: Regular rate and rhythm, S1/S2, no murmurs, no rubs, no gallops  Respiratory: Clear to auscultation bilaterally, no  wheezing, no crackles, no rhonchi  Abdomen: Soft, non tender, non distended, bowel sounds present, no guarding  Extremities: No edema, pulses DP and PT palpable bilaterally  Neuro: Grossly nonfocal  Data Reviewed: Basic Metabolic Panel:  Recent Labs Lab 11/06/12 0334 11/07/12 0430 11/08/12 0430 11/09/12 0300 11/10/12 0420  NA 140 143 141 143 140  K 3.4* 3.0* 3.4* 3.3*  3.5  CL 107 106 105 108 104  CO2 24 27 26 27 26   GLUCOSE 98 119* 134* 121* 131*  BUN 18 14 13 12 13   CREATININE 1.22* 1.27* 1.38* 1.42* 1.48*  CALCIUM 8.5 8.5 8.8 8.6 8.6  MG 1.9 1.8 1.7 1.6 1.7  PHOS 3.5 3.2 4.3 5.1* 4.6   CBC:  Recent Labs Lab 11/04/12 0210 11/08/12 0430 11/09/12 0300  WBC 12.8* 8.4 8.2  HGB 13.8 11.5* 11.1*  HCT 41.7 36.1 33.6*  MCV 90.5 90.9 90.6  PLT 230 254 240   CBG:  Recent Labs Lab 11/09/12 0807 11/09/12 1155 11/09/12 1647 11/09/12 2352 11/10/12 0556  GLUCAP 118* 110* 93 122* 124*    Scheduled Meds: . albuterol  2.5 mg Nebulization Q6H  . ipratropium  0.5 mg Nebulization Q6H  . amitriptyline  25 mg Oral QHS  . budesonide nebulizer  0.5 mg Nebulization BID  . enoxaparin  40 mg Subcutaneous Q24H  . insulin aspart  0-9 Units Subcutaneous Q6H  . pantoprazole  40 mg Oral Daily  . ZOSYN  IV  3.375 g Intravenous Q8H  . polyethylene glycol  17 g Oral Daily  . psyllium  1 packet Oral Daily   Continuous Infusions: . dextrose 5 % and 0.45 % NaCl with KCl 40 mEq/L 75 mL/hr at 11/08/12 2010   Debbora Presto, MD  [facility] Pager [phone]  If 7PM-7AM, please contact night-coverage [email or web address] Password [email or web address] 11/10/2012, 10:06 AM   LOS: 10 days

## 2012-11-10 NOTE — Progress Notes (Signed)
ATTENDING ADDENDUM:  I personally reviewed patient's record, examined the patient, and formulated the following assessment and plan:  She feels good today.  We are unable to wean her O2.  Her Cr is stable.  Zosyn off today.  nephrology working on her elevated creatinine with some further testing.  Ok to d/c once plan for renal w/u in place.  She would need O2 and wound vac changes at home.

## 2012-11-10 NOTE — Progress Notes (Signed)
Offered to change patients ostomy pouch. Pt declined, stated she is going home today and will change it at home  [facility]. Clelia Croft, RN

## 2012-11-10 NOTE — Progress Notes (Addendum)
9 Days Post-Op  Subjective: She is doing nebulizer, and has a productive cough O2 sats are still being recorded with O2 and she was not on O2 prior to surgery/mucus plugging and bronchoscopy. She emptied her ostomy bag once yesterday.she was on miralax and metamucil daily at home before surgery. Complains abdomen hurts this AM  Objective: Vital signs in last 24 hours: Temp:  [97.8 F (36.6 C)-98.3 F (36.8 C)] 97.8 F (36.6 C) (02/21 0448) Pulse Rate:  [60-87] 60 (02/21 0448) Resp:  [18] 18 (02/21 0448) BP: (97-138)/(50-78) 97/50 mmHg (02/21 0448) SpO2:  [94 %-99 %] 94 % (02/21 0448) Last BM Date: 11/09/12 950 PO, afebrile, BP 138/69-97/50 yesterday and this AM Creatinine is still trending up, glucose well controlled Renal ultrasound shows normal appearing kidneys, and no hydronephrosis. Intake/Output from previous day: 02/20 0701 - 02/21 0700 In: 2705 [P.O.:950; I.V.:1605; IV Piggyback:150] Out: -  Intake/Output this shift:    General appearance: alert, cooperative and she is on nebulizer and still has a thick productive cough. Resp: BS down in bases few rales, no wheezing. GI: soft, ostomy working, wound vac in place,  Dr.thomas and I looked at the wound yesterday with the VAC off.  Lab Results:   Recent Labs  11/08/12 0430 11/09/12 0300  WBC 8.4 8.2  HGB 11.5* 11.1*  HCT 36.1 33.6*  PLT 254 240    BMET  Recent Labs  11/09/12 0300 11/10/12 0420  NA 143 140  K 3.3* 3.5  CL 108 104  CO2 27 26  GLUCOSE 121* 131*  BUN 12 13  CREATININE 1.42* 1.48*  CALCIUM 8.6 8.6   PT/INR No results found for this basename: LABPROT, INR,  in the last 72 hours  No results found for this basename: AST, ALT, ALKPHOS, BILITOT, PROT, ALBUMIN,  in the last 168 hours   Lipase     Component Value Date/Time   LIPASE 38 10/31/2012 0545     Studies/Results: Dg Chest 2 View  11/08/2012  *RADIOLOGY REPORT*  Clinical Data: Post treatment for hypoxia/pneumonia.  CHEST - 2 VIEW   Comparison: 11/04/2012 and CT chest 11/02/2012.  Findings: Trachea is midline.  Heart size normal.  Lungs are low in volume with mild diffuse bilateral air space disease, bibasilar predominant.  Biapical pleural thickening.  Small bilateral pleural effusions.  IMPRESSION:  1.  Low lung volumes with mild diffuse bilateral air space disease, worst at the lung bases. 2.  Small bilateral pleural effusions.   Original Report Authenticated By: Leanna Battles, M.D.    US Renal  11/09/2012  *RADIOLOGY REPORT*  Clinical Data: Acute on chronic renal failure.  RENAL/URINARY TRACT ULTRASOUND COMPLETE  Comparison:  CT abdomen and pelvis 10/31/2012.  Findings:  Right Kidney:  Measures 12.9 cm.  Small focus of scar in the mid pole is noted.  No stone, mass or hydronephrosis.  Left Kidney:  Measures 12.8 cm and appears normal without stone, mass or hydronephrosis.  Bladder:  Evaluation is limited but no abnormality is identified.  IMPRESSION: Negative for hydronephrosis.  Normal-appearing kidneys.   Original Report Authenticated By: Holley Dexter, M.D.     Medications: . albuterol  2.5 mg Nebulization Q6H   And  . ipratropium  0.5 mg Nebulization Q6H  . amitriptyline  25 mg Oral QHS  . budesonide (PULMICORT) nebulizer solution  0.5 mg Nebulization BID  . enoxaparin  40 mg Subcutaneous Q24H  . insulin aspart  0-9 Units Subcutaneous Q6H  . pantoprazole  40 mg Oral Daily  .  piperacillin-tazobactam (ZOSYN)  IV  3.375 g Intravenous Q8H    Assessment/Plan 1. POD#8: Large parastomal hernia with small bowel obstruction secondary to  adhesions and partial incarceration in the hernia sac. 2. Incisional hernia. 3. Dense intra-abdominal adhesions.  S/P Exploratory laparotomy with closure of parastomal hernia primarily.  2. Extensive lysis of adhesions taking 1 hour. 11/01/12 Dr.Cornett  3. Repair of multiple small incisional hernia, which was primary.  Post op hypoxia with mucus plugging RML AND RLL- Bronchoscopy by  DR. SOOD 11/02/12  CXR Shows new infiltrates possible pulmonary edema vs aspiration pnemonia, bilateral.  Post op Renal insuffiencey  Medicine is following with [place] for this.  1. 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 Claud Kelp.  2. 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  3. Nonhealing skin ulcer left lower quadrant.  4. Fibromyalgia  5. Anxiety  6. Restless leg syndrome  7. Ongoing tobacco  8. History of nephrolithiasis  9. BMI of approximately 38.   Plan:  Get her back on Miralax and metamucil. Add colace. Defer to medicine on renal function and further respiratory rx.  Do we need to send her home on O2 and nebs if she goes home. You can reach me all day at  [facility] [phone]  Dr. Caryn Section has given Dr. Izola Price some labs to follow up renal function with.  He will see as a PRN on an outpatient basis. She got some additional fluid today for her BP, and she's been encouraged to take more PO. If her renal function is better and she'd doing well she can go home in AM  She has home health, O2, wound vac stuff set up.  Everything should be set up for discharge in AM. BMP is ordered for tomorrow AM. I have done a d/c summary, let me know if she needs an addendum.  11/10/2012 8:39 AM         LOS: 10 days    Tushar Enns 11/10/2012

## 2012-11-10 NOTE — Progress Notes (Signed)
Patient has been approved for wound vac per [place] with [employer], delivery is for 1:30pm today; B Ave Filter RN,BSN,MHA

## 2012-11-10 NOTE — Discharge Summary (Signed)
Physician Discharge Summary  Patient ID: [name] MRN: [id number] DOB/AGE: [birth year] 52 y.o. Christina Baars, MD  Admit date: 10/31/2012 Discharge date: 11/10/2012  Admission Diagnoses: 1. 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 Claud Braun.  2. 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  3. Nonhealing skin ulcer left lower quadrant.  4. Fibromyalgia  5. Anxiety  6. Restless leg syndrome  7. Ongoing tobacco  8. History of nephrolithiasis  9. BMI of approximately 38.  Discharge Diagnoses: 1. 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 2. Incisional hernia. 3. Dense intra-abdominal adhesions.   S/P Exploratory laparotomy with closure of parastomal hernia primarily.  2. Extensive lysis of adhesions taking 1 hour. 11/01/12 Dr.Cornett  3. Repair of multiple small incisional hernia, which was primary.  4.  Post op hypoxia with mucus plugging RML AND RLL- Bronchoscopy by DR. SOOD 11/02/12   CXR Shows new infiltrates possible pulmonary edema vs aspiration pnemonia, bilateral.  5.  Post op Renal insuffiencey   Medicine is following with [place] for this.  1. 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 Claud Braun.  2. 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  3. Nonhealing skin ulcer left lower quadrant.  4. Fibromyalgia  5. Anxiety  6. Restless leg syndrome  7. Ongoing tobacco  8. History of nephrolithiasis  9. BMI of approximately 38.   Principal Problem:   Acute exacerbation of COPD with asthma Active Problems:   Acute respiratory failure with hypoxia   Parastomal hernia   Fibromyalgia   Generalized anxiety disorder   Restless legs syndrome   Obesity (BMI 35.0-39.9 without  comorbidity)   Leukocytosis, unspecified   Hyperglycemia   Cigarette nicotine dependence with withdrawal   Complete atelectasis   HAP (hospital-acquired pneumonia)   Acute pulmonary edema   Acute renal failure   PROCEDURES: 1. S/P Exploratory laparotomy with closure of parastomal hernia primarily.  2. Extensive lysis of adhesions taking 1 hour. 11/01/12 Dr.Cornett  3. Repair of multiple small incisional hernia, which was primary.  Dr. Maisie Fus [name] 11/01/12  2.  Post op hypoxia with mucus plugging RML AND RLL- Bronchoscopy by DR. SOOD 11/02/12    Hospital Course: Patient is a 52 year old female with a history of diverticulitis. She was 06/22/09 by Dr. Sherryll Braun with diverticulitis and underwent drainage by IR. This was followed later with a left colectomy and colostomy take down the splenic flexure on 07/04/09 by Dr. Claud Braun. Since that time she has done well. She never returned for a reversal of her colostomy, for financial reasons. Following her E. 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 It has progressed on CT scan 11/30/10 and again on 08/23/11. She never had an obstruction from this.  She presents today with complaints of worsening abdominal pain. She notes this started approximately 2 weeks ago. It is in the midportion of her abdomen and extends to both the left and the right lower quadrants. It's fairly nonspecific. She's had episodes of nausea for 2 weeks, and some intermittent episodes of vomiting, approximately 4 times over the last 2 weeks. She complains of ongoing problems with eating and then being bloated for the last 4-5 months. She reports taking double doses of Metamucil and MiraLAX. She will have small  bowel movements before the MiraLAX, and then large volume movements which are quite loose in character. This occurs every 3-4 days. Her last BM was last night .  She also reports a recurring skin ulcer to the left of her ostomy. Is usually smaller  than the current ulcer. This is also very painful area and a part of her current symptoms. It is approximately 4 cm x 5 cm oblong in shape and is just left of the ostomy. It is not in the area of the ostomy appliance. This is been present before but much smaller and she says Dr. Clelia Braun believes it is secondary to ischemia. Because her ongoing pain, nausea and intermittent vomiting she felt she was obstructed, and presented to the ER for evaluation. She apparently has not returned to see Dr. Derrell Braun who did her original colostomy secondary to financial issues. Pt was admitted to the hospital and taken to the OR for repair of her hernia.  She underwent the above procedure.  She did well with the surgery but had significant issues with pain and post op hypoxia.  He PCA was adjusted and she was seen by medicine.  Her xray post op showed significant collapse of the RML AND RLL.  She was transferred to the ICU and underwent bronchoscopy later the first post op day.  Follow up CXR showed infiltrates consistent with edema and possible pneumonia.  She was placed on antibiotics, and lasix.  Her respiratory status improved, but her renal function declined. She has been followed by medicine and her renal function although not normal is stable.  It went up some with the ending of her IV fluids, and her BP wound drop into the 90's.  She has received additional fluids for this and to help maintain her BP.  Dr. Caryn Braun discussed this with Dr. Izola Braun and some labs were obtained at his direction.  He said she could be seen as an OP if needed. She is wheezing less and her ventolin has been stopped, as has her Atrovent.  She is still on HHN steroids, and her sats on R/A walking dropped to the 85% range this afternoon so we plan to send her home on O2 at 2 liters min. Her abdominal wound has a wound vac and there have been some issues with the right side being higher than the right.  Dr. Maisie Fus has seen and it was her opinion we could  continue wound vac. Her colostomy is healing well and she is on a regular diet.  If she does well and cleared by medicine she can go home in AM 11/11/12.  Prior to Admission medications   Medication Sig Start Date End Date Taking? Authorizing Provider  amitriptyline (ELAVIL) 25 MG tablet Take 25 mg by mouth at bedtime.     Yes Historical Provider, MD  omeprazole (PRILOSEC) 20 MG capsule Take 20 mg by mouth daily.     Yes Historical Provider, MD  traMADol (ULTRAM) 50 MG tablet Take 50 mg by mouth every 8 (eight) hours as needed. Maximum dose= 8 tablets per day. pain    Yes Historical Provider, MD  acetaminophen (TYLENOL) 325 MG tablet Do not take more than 4000 mg per day. 11/10/12   Sherrie George, PA  budesonide (PULMICORT) 0.5 MG/2ML nebulizer solution Take 2 mLs (0.5 mg total) by nebulization 2 (two) times daily. 11/10/12   Sherrie George, PA  diazepam (VALIUM) 10 MG tablet Try to take just 5 mg for anxiety, this also depresses your respiratory  effort. 11/10/12   Sherrie George, PA  docusate sodium 100 MG CAPS Use as needed to keep your stool loose. 11/10/12   Sherrie George, PA  oxyCODONE (OXY IR/ROXICODONE) 5 MG immediate release tablet Take 1-3 tablets (5-15 mg total) by mouth every 4 (four) hours as needed. 11/10/12   Sherrie George, PA  polyethylene glycol [facility] Ethelene Hal) packet Use daily as needed to prevent constipation 11/10/12   Sherrie George, PA  psyllium (HYDROCIL/METAMUCIL) 95 % PACK Take 1 packet by mouth daily. 11/10/12   Sherrie George, PA   Condition on D/C: improved.    Disposition: 01-Home or Self Care   Future Appointments Provider Department Dept Phone   12/18/2012 2:00 PM Kalman Shan, MD [facility] [phone]       Medication List    STOP taking these medications       BC HEADACHE POWDER PO      TAKE these medications       acetaminophen 325 MG tablet  Commonly known as:  TYLENOL  Do not take more than 4000 mg per day.      amitriptyline 25 MG tablet  Commonly known as:  ELAVIL  Take 25 mg by mouth at bedtime.     budesonide 0.5 MG/2ML nebulizer solution  Commonly known as:  PULMICORT  Take 2 mLs (0.5 mg total) by nebulization 2 (two) times daily.     diazepam 10 MG tablet  Commonly known as:  VALIUM  Try to take just 5 mg for anxiety, this also depresses your respiratory effort.     DSS 100 MG Caps  Use as needed to keep your stool loose.     omeprazole 20 MG capsule  Commonly known as:  PRILOSEC  Take 20 mg by mouth daily.     oxyCODONE 5 MG immediate release tablet  Commonly known as:  Oxy IR/ROXICODONE  Take 1-3 tablets (5-15 mg total) by mouth every 4 (four) hours as needed.     polyethylene glycol packet  Commonly known as:  MIRALAX / GLYCOLAX  Use daily as needed to prevent constipation     psyllium 95 % Pack  Commonly known as:  HYDROCIL/METAMUCIL  Take 1 packet by mouth daily.     traMADol 50 MG tablet  Commonly known as:  ULTRAM  Take 50 mg by mouth every 8 (eight) hours as needed. Maximum dose= 8 tablets per day. pain           Follow-up Information   Follow up with [facility], MD On 12/18/2012. (at 2pm)    Contact information:   [address] [place] [address] [phone]       Signed: Sherrie George 11/10/2012, 4:36 PM  Agree with above.  Ovidio Kin, MD, [facility] Pager: [phone] Office phone:  [phone]

## 2012-11-10 NOTE — Progress Notes (Signed)
SATURATION QUALIFICATIONS: (This note is used to comply with regulatory documentation for home oxygen)  Patient Saturations on Room Air at Rest = 91%  Patient Saturations on Room Air while Ambulating = 85%  Patient Saturations on 2 Liters of oxygen while Ambulating = 93%  Please briefly explain why patient needs home oxygen: 

## 2012-11-11 ENCOUNTER — Inpatient Hospital Stay (HOSPITAL_COMMUNITY): Payer: 59

## 2012-11-11 LAB — BASIC METABOLIC PANEL
BUN: 10 mg/dL (ref 6–23)
CO2: 27 mEq/L (ref 19–32)
Calcium: 8.2 mg/dL — ABNORMAL LOW (ref 8.4–10.5)
Chloride: 106 mEq/L (ref 96–112)
Creatinine, Ser: 1.5 mg/dL — ABNORMAL HIGH (ref 0.50–1.10)
GFR calc Af Amer: 45 mL/min — ABNORMAL LOW (ref >90)
GFR calc non Af Amer: 39 mL/min — ABNORMAL LOW (ref >90)
Glucose, Bld: 146 mg/dL — ABNORMAL HIGH (ref 70–99)
Potassium: 3.6 mEq/L (ref 3.5–5.1)
Sodium: 141 mEq/L (ref 135–145)

## 2012-11-11 LAB — GLUCOSE, CAPILLARY
Glucose-Capillary: 148 mg/dL — ABNORMAL HIGH (ref 70–99)
Glucose-Capillary: 131 mg/dL — ABNORMAL HIGH (ref 70–99)
Glucose-Capillary: 119 mg/dL — ABNORMAL HIGH (ref 70–99)

## 2012-11-11 LAB — PHOSPHORUS: Phosphorus: 3.8 mg/dL (ref 2.3–4.6)

## 2012-11-11 LAB — C4 COMPLEMENT: Complement C4, Body Fluid: 31 mg/dL (ref 10–40)

## 2012-11-11 LAB — C3 COMPLEMENT: C3 Complement: 140 mg/dL (ref 90–180)

## 2012-11-11 LAB — MAGNESIUM: Magnesium: 1.8 mg/dL (ref 1.5–2.5)

## 2012-11-11 NOTE — Care Management (Signed)
CM confirmed HH orders for discharge with [facility]. Facesheet, D/C summary, MD orders faxed to [facility] at [phone]. Confirmation received. Wound vac for home use present in room. RN to hook pt to vac prior to discharge. MD order for home 02 and home nebulizer. DME delivery scheduled to room prior to discharge.  Pt's spouse to provide tx home. No other needs stated.   [name] [phone]

## 2012-11-11 NOTE — Progress Notes (Signed)
General Surgery Note  LOS: 11 days  POD -   10 Days Post-Op  Assessment/Plan: 1.  EXPLORATORY LAPAROTOMY, HERNIA REPAIR PARASTOMAL, HERNIA REPAIR INCISIONAL, LYSIS OF ADHESION, APPLICATION OF WOUND VAC - T. Cornett - 11/01/2012  For SBO  Doing well - ready to go home  1a.  Has VAC on midline wound  I removed JP drain lateral to ostomy.   2. Post op hypoxia and mucous plugging.  Bronchoscopy by Dr. Craige Cotta - 11/02/2012  Qualifies for home O2 - order in Epic (I think) 3. Nonhealing skin ulcer left lower quadrant.  4. Fibromyalgia  5. Anxiety  6. Restless leg syndrome  7. Ongoing tobacco  8. History of nephrolithiasis  9. BMI of approximately 38. 10.  DVT prophylaxis - Lovenox 11.  Increased creatinine to 1.50 - 11/11/2012  Subjective:  Doing well.  Ready for discharge home.  Has pain with VAC change, but has meds going forward. Objective:   Filed Vitals:   11/11/12 0831  BP: 134/69  Pulse: 66  Temp: 98 F (36.7 C)  Resp: 20     Intake/Output from previous day:  02/21 0701 - 02/22 0700 In: 3187.5 [P.O.:360; I.V.:1827.5; IV Piggyback:1000] Out: 0   Intake/Output this shift:      Physical Exam:   General: Obese WF who is alert and oriented.    HEENT: Normal. Pupils equal. .   Lungs: Clear.   Abdomen: Midline VAC.  Left sided ostomy.  JP drain lateral to ostomy (this has not drained in days.)   Wound: VAC - to change today.     Lab Results:    Recent Labs  11/09/12 0300  WBC 8.2  HGB 11.1*  HCT 33.6*  PLT 240    BMET   Recent Labs  11/10/12 0420 11/11/12 0443  NA 140 141  K 3.5 3.6  CL 104 106  CO2 26 27  GLUCOSE 131* 146*  BUN 13 10  CREATININE 1.48* 1.50*  CALCIUM 8.6 8.2*    PT/INR  No results found for this basename: LABPROT, INR,  in the last 72 hours  ABG  No results found for this basename: PHART, PCO2, PO2, HCO3,  in the last 72 hours   Studies/Results:  US Renal  11/09/2012  *RADIOLOGY REPORT*  Clinical Data: Acute on chronic renal  failure.  RENAL/URINARY TRACT ULTRASOUND COMPLETE  Comparison:  CT abdomen and pelvis 10/31/2012.  Findings:  Right Kidney:  Measures 12.9 cm.  Small focus of scar in the mid pole is noted.  No stone, mass or hydronephrosis.  Left Kidney:  Measures 12.8 cm and appears normal without stone, mass or hydronephrosis.  Bladder:  Evaluation is limited but no abnormality is identified.  IMPRESSION: Negative for hydronephrosis.  Normal-appearing kidneys.   Original Report Authenticated By: Holley Dexter, M.D.    Dg Chest Port 1 View  11/11/2012  *RADIOLOGY REPORT*  Clinical Data: Acute respiratory failure.  PORTABLE CHEST - 1 VIEW  Comparison: 11/08/2012  Findings: Portable view of the chest demonstrates persistent basilar densities, left side greater than right.  Cannot exclude left pleural fluid.  Heart size is stable.  Prominent lung markings may be related to the low lung volumes.  Trachea is midline.  IMPRESSION: Persistent basilar densities, left side greater than right. Minimal change from the previous examination.   Original Report Authenticated By: Richarda Overlie, M.D.      Anti-infectives:   Anti-infectives   Start     Dose/Rate Route Frequency Ordered Stop  11/05/12 1800  vancomycin (VANCOCIN) 1,500 mg in sodium chloride 0.9 % 500 mL IVPB  Status:  Discontinued     1,500 mg 250 mL/hr over 120 Minutes Intravenous Every 24 hours 11/04/12 1736 11/05/12 1029   11/04/12 1745  vancomycin (VANCOCIN) 500 mg in sodium chloride 0.9 % 100 mL IVPB     500 mg 100 mL/hr over 60 Minutes Intravenous  Once 11/04/12 1735 11/04/12 1929   11/03/12 2000  vancomycin (VANCOCIN) IVPB 1000 mg/200 mL premix  Status:  Discontinued     1,000 mg 200 mL/hr over 60 Minutes Intravenous Every 12 hours 11/03/12 0824 11/04/12 1734   11/03/12 0900  vancomycin (VANCOCIN) 2,000 mg in sodium chloride 0.9 % 500 mL IVPB     2,000 mg 250 mL/hr over 120 Minutes Intravenous  Once 11/03/12 0824 11/03/12 1053   11/03/12 0830   piperacillin-tazobactam (ZOSYN) IVPB 3.375 g  Status:  Discontinued     3.375 g 12.5 mL/hr over 240 Minutes Intravenous 3 times per day 11/03/12 0823 11/10/12 1338   11/03/12 0800  levofloxacin (LEVAQUIN) IVPB 750 mg  Status:  Discontinued     750 mg 100 mL/hr over 90 Minutes Intravenous Every 24 hours 11/03/12 0748 11/03/12 0816   11/01/12 1500  cefOXitin (MEFOXIN) 1 g in dextrose 5 % 50 mL IVPB     1 g 100 mL/hr over 30 Minutes Intravenous Every 6 hours 11/01/12 1338 11/02/12 0859   11/01/12 0600  cefOXitin (MEFOXIN) 2 g in dextrose 5 % 50 mL IVPB  Status:  Discontinued     2 g 100 mL/hr over 30 Minutes Intravenous On call to O.R. 10/31/12 1647 11/01/12 1312      Ovidio Kin, MD, FACS Pager: [phone],   [facility] Office: [phone] 11/11/2012

## 2012-11-11 NOTE — Progress Notes (Signed)
Patient ID: [name], female   DOB: 1961/03/20, 52 y.o.   MRN: [id number]  [facility] PROGRESS NOTE  ZELLA DEWAN JYN:[id number] DOB: August 10, 1961 DOA: 10/31/2012 PCP: [name], MD   Brief narrative:  The patient is a 52 year old female with history of asthma, RLS, fibromyalgia, anxiety, obesity and history of diverticulitis in 2010 resulting in left colectomy and colostomy. She did not return for subsequent reanastomosis to clinic. She developed a parastomal hernia that has been monitored for the last several years and which has been getting bigger. She presented to the ER with a 2-week history of increasing abdominal pain with nausea and vomiting due to possible incarceration of small bowel in the parastomal hernia. She underwent surgery on 11/01/12 with lysis of adhesions and primary correction of the hernia and several other smaller incisional hernias. The patient's hospital course has been complicated by acute on chronic respiratory failure necessitating transfer to the step down unit and evaluation by the pulmonary critical care team. She underwent bronchoscopy 11/02/2012 and has been on therapy for aspiration pneumonia, and has been on antibiotics since 11/03/2012. She also has been treated for mucus plugging.   Impression/Recommendations  Acute exacerbation of COPD with asthma, HCAP  - Completed therapy with Zosyn - scheduled bronchodilator therapy treatments and when necessary albuterol for wheezing.  Acute respiratory failure with hypoxia secondary to mucous plugging and atelectasis  - Status post bronchoscopy with removal of mucous plug on 11/02/2012.  - clinically stable from respiratory standpoint, maintaining oxygen saturations at target range  Parastomal hernia  - Status post lysis of adhesions and primary closure of hernia  Leukocytosis, unspecified  - White blood cell count within normal limits this AM  Hyperglycemia  - Hemoglobin A1c is 6.2%, consistent  with impaired fasting glucose.  - Diet discussed with patient  Acute renal failure  - creatinine slightly up from yesterday, renal US unremarkable  - discussed with Dr. Caryn Section, SPEP, UPEP, ANA, C3, C4 level obtained, C3/C4 within normal limits, rest of the blood work pending and will have to be follow up with PCP, I will route this note to PCP - please repeat BMP in 1 -2 weeks and see if nephrology referral needed - per nephrologist, an outpatient follow up reasonable  Hypokalemia  - within normal limits this AM   Procedures/Studies:  US Renal 11/09/2012  Negative for hydronephrosis. Normal-appearing kidneys. Original Report Authenticated By: Holley Dexter, M.D.  Dg Chest 2 View 11/08/2012  1. Low lung volumes with mild diffuse bilateral air space disease, worst at the lung bases.  2. Small bilateral pleural effusions.  Antibiotics:  Zosyn 2/14 --> 11/10/2012  Code Status: Full  Family Communication: Pt at bedside    HPI/Subjective: No events overnight.   Objective: Filed Vitals:   11/10/12 1510 11/10/12 1928 11/10/12 2100 11/11/12 0611  BP:   110/48 105/49  Pulse:   84 65  Temp:   98.8 F (37.1 C) 98.8 F (37.1 C)  TempSrc:   Oral Oral  Resp:  18 18 18   Height:      Weight:      SpO2: 98%  97% 97%    Intake/Output Summary (Last 24 hours) at 11/11/12 8657 Last data filed at 11/11/12 0202  Gross per 24 hour  Intake 2587.5 ml  Output      0 ml  Net 2587.5 ml    Exam:   General:  Pt is alert, follows commands appropriately, not in acute distress  Cardiovascular: Regular rate and  rhythm, S1/S2, no murmurs, no rubs, no gallops  Respiratory: Clear to auscultation bilaterally, no wheezing, no crackles, no rhonchi  Abdomen: Soft, non tender, non distended, bowel sounds present, no guarding  Extremities: No edema, pulses DP and PT palpable bilaterally  Neuro: Grossly nonfocal  Data Reviewed: Basic Metabolic Panel:  Recent Labs Lab 11/07/12 0430 11/08/12 0430  11/09/12 0300 11/10/12 0420 11/11/12 0443  NA 143 141 143 140 141  K 3.0* 3.4* 3.3* 3.5 3.6  CL 106 105 108 104 106  CO2 27 26 27 26 27   GLUCOSE 119* 134* 121* 131* 146*  BUN 14 13 12 13 10   CREATININE 1.27* 1.38* 1.42* 1.48* 1.50*  CALCIUM 8.5 8.8 8.6 8.6 8.2*  MG 1.8 1.7 1.6 1.7 1.8  PHOS 3.2 4.3 5.1* 4.6 3.8   CBC:  Recent Labs Lab 11/08/12 0430 11/09/12 0300  WBC 8.4 8.2  HGB 11.5* 11.1*  HCT 36.1 33.6*  MCV 90.9 90.6  PLT 254 240   CBG:  Recent Labs Lab 11/10/12 1203 11/10/12 1718 11/10/12 2143 11/11/12 0001 11/11/12 0610  GLUCAP 129* 117* 142* 144* 148*    Recent Results (from the past 240 hour(s))  CULTURE, BAL-QUANTITATIVE     Status: None   Collection Time    11/02/12  3:05 PM      Result Value Range Status   Specimen Description BRONCHIAL ALVEOLAR LAVAGE   Final   Special Requests NONE   Final   Gram Stain     Final   Value: ABUNDANT WBC PRESENT, PREDOMINANTLY PMN     NO SQUAMOUS EPITHELIAL CELLS SEEN     RARE GRAM POSITIVE COCCI     IN PAIRS IN CHAINS   Colony Count >=100,000 COLONIES/ML   Final   Culture Non-Pathogenic Oropharyngeal-type Flora Isolated.   Final   Report Status 11/05/2012 FINAL   Final     Scheduled Meds: . amitriptyline  25 mg Oral QHS  . budesonide (PULMICORT) nebulizer solution  0.5 mg Nebulization BID  . enoxaparin  40 mg Subcutaneous Q24H  . insulin aspart  0-9 Units Subcutaneous Q6H  . pantoprazole  40 mg Oral Daily  . polyethylene glycol  17 g Oral Daily  . psyllium  1 packet Oral Daily   Continuous Infusions: . dextrose 5 % and 0.45 % NaCl with KCl 40 mEq/L 75 mL/hr at 11/11/12 Lynann Bologna, MD  [facility] Pager [phone]  If 7PM-7AM, please contact night-coverage [email or web address] Password [email or web address] 11/11/2012, 6:59 AM   LOS: 11 days

## 2012-11-13 ENCOUNTER — Other Ambulatory Visit (INDEPENDENT_AMBULATORY_CARE_PROVIDER_SITE_OTHER): Payer: Self-pay | Admitting: Surgery

## 2012-11-13 ENCOUNTER — Telehealth (INDEPENDENT_AMBULATORY_CARE_PROVIDER_SITE_OTHER): Payer: Self-pay

## 2012-11-13 LAB — ANA: Anti Nuclear Antibody(ANA): NEGATIVE

## 2012-11-13 MED ORDER — PROMETHAZINE HCL 12.5 MG PO TABS: 12.5000 mg | ORAL_TABLET | Freq: Four times a day (QID) | ORAL | Status: DC | PRN

## 2012-11-13 NOTE — Telephone Encounter (Signed)
Called pt to let her know med sent to pharmacy and I gave her a post op appt.

## 2012-11-13 NOTE — Telephone Encounter (Signed)
The nurse with [facility] called to report the patient is not eating much.  She is nauseated.  She has not vomited.  She has no fever.  She would like something for nausea.  She uses [facility] on [address].

## 2012-11-14 LAB — PROTEIN ELECTROPHORESIS, SERUM
Albumin ELP: 48.2 % — ABNORMAL LOW (ref 55.8–66.1)
Alpha-1-Globulin: 9.3 % — ABNORMAL HIGH (ref 2.9–4.9)
Alpha-2-Globulin: 16.4 % — ABNORMAL HIGH (ref 7.1–11.8)
Beta 2: 5.7 % (ref 3.2–6.5)
Beta Globulin: 7.5 % — ABNORMAL HIGH (ref 4.7–7.2)
Gamma Globulin: 12.9 % (ref 11.1–18.8)
M-Spike, %: NOT DETECTED g/dL
Total Protein ELP: 6.1 g/dL (ref 6.0–8.3)

## 2012-11-14 LAB — UIFE/LIGHT CHAINS/TP QN, 24-HR UR
Albumin, U: DETECTED
Alpha 1, Urine: DETECTED — AB
Alpha 2, Urine: DETECTED — AB
Beta, Urine: DETECTED — AB
Free Kappa Lt Chains,Ur: 10.9 mg/dL — ABNORMAL HIGH (ref 0.14–2.42)
Free Kappa/Lambda Ratio: 7.41 ratio (ref 2.04–10.37)
Free Lambda Lt Chains,Ur: 1.47 mg/dL — ABNORMAL HIGH (ref 0.02–0.67)
Gamma Globulin, Urine: DETECTED — AB
Total Protein, Urine: 13.3 mg/dL

## 2012-11-15 MED FILL — Neomycin-Bacitracin-Polymyxin Oint: CUTANEOUS | Qty: 28 | Status: AC

## 2012-11-17 ENCOUNTER — Telehealth (INDEPENDENT_AMBULATORY_CARE_PROVIDER_SITE_OTHER): Payer: Self-pay | Admitting: General Surgery

## 2012-11-17 NOTE — Telephone Encounter (Signed)
[name] with [employer] called to say that [facility] denied wound vac and they requested a peer to peer to overturn decision, otherwise pt may have to pay for wound vac from hospital discharge to now. Pt  has wound care through advanced home care/ [facility]- insurance ID- [id number], Peer to Peer # [phone] Option 3. Please let [name] with [employer] know outcome, or call her if you have any questions/gy/ Barbara phone# [phone] Ext- [phone]

## 2012-11-20 ENCOUNTER — Telehealth (INDEPENDENT_AMBULATORY_CARE_PROVIDER_SITE_OTHER): Payer: Self-pay | Admitting: General Surgery

## 2012-11-20 NOTE — Telephone Encounter (Signed)
[name] with [employer] calling back about wound vac wanting to know outcome from her previous message: [name] with [employer] called to say that [facility] denied wound vac and they requested a peer to peer to overturn decision, otherwise pt may have to pay for wound vac from hospital discharge to now. Pt has wound care through advanced home care/ [facility]- insurance ID- [id number], Peer to Peer # [phone] Option 3. Please let [name] with [employer] know outcome, or call her if you have any questions/gy/ [name] phone# [phone] Ext- [phone]  I told her I would forward this again.

## 2012-11-21 ENCOUNTER — Ambulatory Visit (INDEPENDENT_AMBULATORY_CARE_PROVIDER_SITE_OTHER): Payer: 59 | Admitting: Surgery

## 2012-11-21 ENCOUNTER — Telehealth (INDEPENDENT_AMBULATORY_CARE_PROVIDER_SITE_OTHER): Payer: Self-pay

## 2012-11-21 ENCOUNTER — Encounter (INDEPENDENT_AMBULATORY_CARE_PROVIDER_SITE_OTHER): Payer: Self-pay | Admitting: Surgery

## 2012-11-21 VITALS — BP 160/88 | HR 90 | Temp 97.8°F | Resp 18 | Ht 64.0 in | Wt 226.4 lb

## 2012-11-21 DIAGNOSIS — Z9889 Other specified postprocedural states: Secondary | ICD-10-CM

## 2012-11-21 MED ORDER — OXYCODONE HCL 5 MG PO TABS: 5.0000 mg | ORAL_TABLET | ORAL | Status: DC | PRN

## 2012-11-21 MED ORDER — OXYCODONE-ACETAMINOPHEN 10-325 MG PO TABS: 1.0000 | ORAL_TABLET | Freq: Four times a day (QID) | ORAL | Status: DC | PRN

## 2012-11-21 NOTE — Progress Notes (Signed)
Pt returns after repair of parastomal hernia.  She has pain but is doing ok.   Exam:  Wound vac removed.  Wound clean.  Ostomy viable.    S/p repair of parastomal hernia with bowel obstruction Wet to dry dressing wound care BID Return 1 month

## 2012-11-21 NOTE — Patient Instructions (Signed)
Wet to dry two times a day.  Return 1 month

## 2012-11-21 NOTE — Telephone Encounter (Signed)
I returned phone call to [place]. I told her Dr Luisa Hart wanted to know if insurance would pay for any other days patient had wound vac. She said that would be up to the insurance company after the peer to peer. I let Dr Luisa Hart know and he said he was not going to do peer to peer because they would not approve it. I let Britta Mccreedy know and that patient needs to got to BID wet to dry dressing changes. She said okay.

## 2012-11-25 ENCOUNTER — Encounter (HOSPITAL_COMMUNITY): Payer: Self-pay

## 2012-11-25 ENCOUNTER — Emergency Department (HOSPITAL_COMMUNITY): Payer: 59

## 2012-11-25 ENCOUNTER — Observation Stay (HOSPITAL_COMMUNITY)
Admission: EM | Admit: 2012-11-25 | Discharge: 2012-11-28 | Disposition: A | Discharge status: Home / Self Care | Payer: 59 | Attending: Internal Medicine | Admitting: Internal Medicine

## 2012-11-25 DIAGNOSIS — F411 Generalized anxiety disorder: Secondary | ICD-10-CM | POA: Diagnosis present

## 2012-11-25 DIAGNOSIS — R739 Hyperglycemia, unspecified: Secondary | ICD-10-CM

## 2012-11-25 DIAGNOSIS — Z79899 Other long term (current) drug therapy: Secondary | ICD-10-CM | POA: Insufficient documentation

## 2012-11-25 DIAGNOSIS — K922 Gastrointestinal hemorrhage, unspecified: Secondary | ICD-10-CM

## 2012-11-25 DIAGNOSIS — J9811 Atelectasis: Secondary | ICD-10-CM

## 2012-11-25 DIAGNOSIS — K921 Melena: Secondary | ICD-10-CM | POA: Insufficient documentation

## 2012-11-25 DIAGNOSIS — R195 Other fecal abnormalities: Principal | ICD-10-CM | POA: Diagnosis present

## 2012-11-25 DIAGNOSIS — Z87891 Personal history of nicotine dependence: Secondary | ICD-10-CM | POA: Insufficient documentation

## 2012-11-25 DIAGNOSIS — Z9889 Other specified postprocedural states: Secondary | ICD-10-CM

## 2012-11-25 DIAGNOSIS — K219 Gastro-esophageal reflux disease without esophagitis: Secondary | ICD-10-CM | POA: Diagnosis present

## 2012-11-25 DIAGNOSIS — D72829 Elevated white blood cell count, unspecified: Secondary | ICD-10-CM

## 2012-11-25 DIAGNOSIS — Z9049 Acquired absence of other specified parts of digestive tract: Secondary | ICD-10-CM | POA: Insufficient documentation

## 2012-11-25 DIAGNOSIS — IMO0001 Reserved for inherently not codable concepts without codable children: Secondary | ICD-10-CM | POA: Insufficient documentation

## 2012-11-25 DIAGNOSIS — Z933 Colostomy status: Secondary | ICD-10-CM | POA: Insufficient documentation

## 2012-11-25 DIAGNOSIS — J449 Chronic obstructive pulmonary disease, unspecified: Secondary | ICD-10-CM | POA: Diagnosis present

## 2012-11-25 DIAGNOSIS — E669 Obesity, unspecified: Secondary | ICD-10-CM

## 2012-11-25 DIAGNOSIS — N179 Acute kidney failure, unspecified: Secondary | ICD-10-CM | POA: Insufficient documentation

## 2012-11-25 DIAGNOSIS — J441 Chronic obstructive pulmonary disease with (acute) exacerbation: Secondary | ICD-10-CM

## 2012-11-25 DIAGNOSIS — J189 Pneumonia, unspecified organism: Secondary | ICD-10-CM

## 2012-11-25 DIAGNOSIS — M797 Fibromyalgia: Secondary | ICD-10-CM

## 2012-11-25 DIAGNOSIS — Y95 Nosocomial condition: Secondary | ICD-10-CM

## 2012-11-25 DIAGNOSIS — R197 Diarrhea, unspecified: Secondary | ICD-10-CM | POA: Insufficient documentation

## 2012-11-25 DIAGNOSIS — E876 Hypokalemia: Secondary | ICD-10-CM | POA: Diagnosis present

## 2012-11-25 DIAGNOSIS — J810 Acute pulmonary edema: Secondary | ICD-10-CM

## 2012-11-25 DIAGNOSIS — J9601 Acute respiratory failure with hypoxia: Secondary | ICD-10-CM

## 2012-11-25 DIAGNOSIS — J45901 Unspecified asthma with (acute) exacerbation: Secondary | ICD-10-CM

## 2012-11-25 DIAGNOSIS — G2581 Restless legs syndrome: Secondary | ICD-10-CM

## 2012-11-25 DIAGNOSIS — K435 Parastomal hernia without obstruction or  gangrene: Secondary | ICD-10-CM

## 2012-11-25 DIAGNOSIS — F17213 Nicotine dependence, cigarettes, with withdrawal: Secondary | ICD-10-CM

## 2012-11-25 DIAGNOSIS — J4489 Other specified chronic obstructive pulmonary disease: Secondary | ICD-10-CM | POA: Insufficient documentation

## 2012-11-25 LAB — CBC WITH DIFFERENTIAL/PLATELET
Basophils Absolute: 0 10*3/uL (ref 0.0–0.1)
Basophils Relative: 1 % (ref 0–1)
Eosinophils Absolute: 0.4 10*3/uL (ref 0.0–0.7)
Eosinophils Relative: 6 % — ABNORMAL HIGH (ref 0–5)
HCT: 39.9 % (ref 36.0–46.0)
Hemoglobin: 13 g/dL (ref 12.0–15.0)
Lymphocytes Relative: 21 % (ref 12–46)
Lymphs Abs: 1.2 10*3/uL (ref 0.7–4.0)
MCH: 28.8 pg (ref 26.0–34.0)
MCHC: 32.6 g/dL (ref 30.0–36.0)
MCV: 88.3 fL (ref 78.0–100.0)
Monocytes Absolute: 0.6 10*3/uL (ref 0.1–1.0)
Monocytes Relative: 9 % (ref 3–12)
Neutro Abs: 3.8 10*3/uL (ref 1.7–7.7)
Neutrophils Relative %: 63 % (ref 43–77)
Platelets: 255 10*3/uL (ref 150–400)
RBC: 4.52 MIL/uL (ref 3.87–5.11)
RDW: 12.4 % (ref 11.5–15.5)
WBC: 5.9 10*3/uL (ref 4.0–10.5)

## 2012-11-25 LAB — COMPREHENSIVE METABOLIC PANEL
ALT: <5 U/L (ref 0–35)
AST: 13 U/L (ref 0–37)
Albumin: 3 g/dL — ABNORMAL LOW (ref 3.5–5.2)
Alkaline Phosphatase: 78 U/L (ref 39–117)
BUN: 15 mg/dL (ref 6–23)
CO2: 25 mEq/L (ref 19–32)
Calcium: 9.3 mg/dL (ref 8.4–10.5)
Chloride: 103 mEq/L (ref 96–112)
Creatinine, Ser: 1.04 mg/dL (ref 0.50–1.10)
GFR calc Af Amer: 70 mL/min — ABNORMAL LOW (ref >90)
GFR calc non Af Amer: 60 mL/min — ABNORMAL LOW (ref >90)
Glucose, Bld: 161 mg/dL — ABNORMAL HIGH (ref 70–99)
Potassium: 3.4 mEq/L — ABNORMAL LOW (ref 3.5–5.1)
Sodium: 139 mEq/L (ref 135–145)
Total Bilirubin: 0.3 mg/dL (ref 0.3–1.2)
Total Protein: 7.4 g/dL (ref 6.0–8.3)

## 2012-11-25 LAB — HEMOGLOBIN AND HEMATOCRIT, BLOOD
HCT: 39.5 % (ref 36.0–46.0)
HCT: 38 % (ref 36.0–46.0)
Hemoglobin: 13.1 g/dL (ref 12.0–15.0)
Hemoglobin: 12.4 g/dL (ref 12.0–15.0)

## 2012-11-25 LAB — APTT: aPTT: 39 seconds — ABNORMAL HIGH (ref 24–37)

## 2012-11-25 LAB — PROTIME-INR
INR: 1.04 (ref 0.00–1.49)
Prothrombin Time: 13.5 seconds (ref 11.6–15.2)

## 2012-11-25 LAB — OCCULT BLOOD, POC DEVICE: Fecal Occult Bld: POSITIVE — AB

## 2012-11-25 LAB — LIPASE, BLOOD: Lipase: 23 U/L (ref 11–59)

## 2012-11-25 LAB — CLOSTRIDIUM DIFFICILE BY PCR: Toxigenic C. Difficile by PCR: NEGATIVE

## 2012-11-25 MED ORDER — OXYCODONE HCL 5 MG PO TABS
5.0000 mg | ORAL_TABLET | ORAL | Status: DC | PRN
  Administered 2012-11-25 – 2012-11-28 (×9): 10 mg via ORAL
  Filled 2012-11-25 (×9): qty 2

## 2012-11-25 MED ORDER — ALBUTEROL SULFATE (5 MG/ML) 0.5% IN NEBU: 2.5000 mg | INHALATION_SOLUTION | RESPIRATORY_TRACT | Status: DC | PRN

## 2012-11-25 MED ORDER — PANTOPRAZOLE SODIUM 40 MG IV SOLR
80.0000 mg | Freq: Once | INTRAVENOUS | Status: DC
  Filled 2012-11-25: qty 80

## 2012-11-25 MED ORDER — DIAZEPAM 5 MG PO TABS
5.0000 mg | ORAL_TABLET | Freq: Two times a day (BID) | ORAL | Status: DC | PRN
  Administered 2012-11-25 – 2012-11-26 (×2): 5 mg via ORAL
  Filled 2012-11-25 (×3): qty 1

## 2012-11-25 MED ORDER — ONDANSETRON HCL 4 MG/2ML IJ SOLN: 4.0000 mg | Freq: Four times a day (QID) | INTRAMUSCULAR | Status: DC | PRN

## 2012-11-25 MED ORDER — HYDROCODONE-ACETAMINOPHEN 5-325 MG PO TABS
1.0000 | ORAL_TABLET | Freq: Once | ORAL | Status: AC
  Administered 2012-11-25: 1 via ORAL
  Filled 2012-11-25: qty 1

## 2012-11-25 MED ORDER — SODIUM CHLORIDE 0.9 % IJ SOLN
3.0000 mL | Freq: Two times a day (BID) | INTRAMUSCULAR | Status: DC
  Administered 2012-11-25 – 2012-11-27 (×4): 3 mL via INTRAVENOUS

## 2012-11-25 MED ORDER — SODIUM CHLORIDE 0.9 % IV SOLN: INTRAVENOUS | Status: DC

## 2012-11-25 MED ORDER — POTASSIUM CHLORIDE CRYS ER 20 MEQ PO TBCR
40.0000 meq | EXTENDED_RELEASE_TABLET | Freq: Once | ORAL | Status: AC
  Administered 2012-11-25: 40 meq via ORAL
  Filled 2012-11-25: qty 2

## 2012-11-25 MED ORDER — ACETAMINOPHEN 650 MG RE SUPP: 650.0000 mg | Freq: Four times a day (QID) | RECTAL | Status: DC | PRN

## 2012-11-25 MED ORDER — PSYLLIUM 95 % PO PACK
1.0000 | PACK | Freq: Every day | ORAL | Status: DC
  Administered 2012-11-25 – 2012-11-28 (×4): 1 via ORAL
  Filled 2012-11-25 (×4): qty 1

## 2012-11-25 MED ORDER — ONDANSETRON HCL 4 MG/2ML IJ SOLN: 4.0000 mg | Freq: Three times a day (TID) | INTRAMUSCULAR | Status: DC | PRN

## 2012-11-25 MED ORDER — DIAZEPAM 5 MG PO TABS: 5.0000 mg | ORAL_TABLET | Freq: Two times a day (BID) | ORAL | Status: DC | PRN

## 2012-11-25 MED ORDER — PANTOPRAZOLE SODIUM 40 MG PO TBEC
40.0000 mg | DELAYED_RELEASE_TABLET | Freq: Every day | ORAL | Status: DC
  Administered 2012-11-25 – 2012-11-26 (×2): 40 mg via ORAL
  Filled 2012-11-25 (×2): qty 1

## 2012-11-25 MED ORDER — ONDANSETRON HCL 4 MG PO TABS
4.0000 mg | ORAL_TABLET | Freq: Four times a day (QID) | ORAL | Status: DC | PRN
  Administered 2012-11-26 – 2012-11-27 (×2): 4 mg via ORAL
  Filled 2012-11-25 (×2): qty 1

## 2012-11-25 MED ORDER — ACETAMINOPHEN 325 MG PO TABS: 650.0000 mg | ORAL_TABLET | Freq: Four times a day (QID) | ORAL | Status: DC | PRN

## 2012-11-25 MED ORDER — ONDANSETRON 4 MG PO TBDP
4.0000 mg | ORAL_TABLET | Freq: Once | ORAL | Status: AC
  Administered 2012-11-25: 4 mg via ORAL
  Filled 2012-11-25: qty 1

## 2012-11-25 MED ORDER — AMITRIPTYLINE HCL 25 MG PO TABS
25.0000 mg | ORAL_TABLET | Freq: Every day | ORAL | Status: DC
  Administered 2012-11-25 – 2012-11-27 (×3): 25 mg via ORAL
  Filled 2012-11-25 (×4): qty 1

## 2012-11-25 NOTE — H&P (Signed)
[facility] History and Physical  [name] WUJ:[id number] DOB: 1961-08-18 DOA: 11/25/2012  Referring physician: Dr Ranae Palms PCP: [name], MD  Specialists:   Chief Complaint: black stools/diarrhea   HPI: [name] is a 52 y.o. female with PMH as listed below who presents with the above complaints. She states that she had diarrhea/ increased colostomy ouput for 4-5 days, and began taking peptobismol for it. She reports that she also noted that her stool has been black- initially she attributed it to some grape juice she drank but this has been persistent, so she came to the ED. She admits to nausea but no vomiting. She is S/P  L. Colectomy in 2010 with colostomy and in 10/2012 had surgery for parastomal hernia with bowel obstuction and states she has had some burning/pain aound the wound but was last seen by Dr Luisa Hart on 3/4 and woud found to be clean and ostomy viable. She was seen in the ED and abd x-ray neg for acute findings and guaiac+, hgb stable at 13. She is admitted for further eval and management.   Review of Systems: The patient denies anorexia, fever, weight loss,, vision loss, decreased hearing, hoarseness, chest pain, syncope, dyspnea on exertion, peripheral edema, balance deficits, hemoptysis, hematochezia, severe indigestion/heartburn, hematuria, incontinence,, muscle weakness, suspicious skin lesions, transient blindness, difficulty walking, depression, unusual weight change.    Past Medical History  Diagnosis Date  . Diverticulitis   . Fibromyalgia   . Restless leg syndrome     Bilateral  . Asthma   . Bronchitis, chronic     Effects worse with URI  . Kidney stones     at least 6 stones in past  . Headache     migraine headache  . Acid reflux disease   . Anxiety   . COPD (chronic obstructive pulmonary disease)   . Fibromyalgia    Past Surgical History  Procedure Laterality Date  . Colostomy    . Appendectomy    . Cholecystectomy     . Partial hysterectomy      partial  . Colostomy closure N/A 11/01/2012    Procedure: COLOSTOMY CLOSURE;  Surgeon: Clovis Pu. Cornett, MD;  Location: [facility];  Service: General;  Laterality: N/A;  REPAIR PARASTOMAL HERNIA; REVISION OF COLOSTOMY  . Laparotomy  11/01/2012    Procedure: EXPLORATORY LAPAROTOMY;  Surgeon: Clovis Pu. Cornett, MD;  Location: [facility];  Service: General;;  exploratory laparotomy,repair of parastomal hernia and incisional hernia, lysis of adhesions, and application of wound V.A.C.  . Parastomal hernia repair N/A 11/01/2012    Procedure: HERNIA REPAIR PARASTOMAL;  Surgeon: Clovis Pu. Cornett, MD;  Location: [facility];  Service: General;  Laterality: N/A;  . Incisional hernia repair N/A 11/01/2012    Procedure: HERNIA REPAIR INCISIONAL;  Surgeon: Clovis Pu. Cornett, MD;  Location: [facility];  Service: General;  Laterality: N/A;  . Lysis of adhesion N/A 11/01/2012    Procedure: LYSIS OF ADHESION;  Surgeon: Clovis Pu. Cornett, MD;  Location: [facility];  Service: General;  Laterality: N/A;  . Application of wound vac N/A 11/01/2012    Procedure: APPLICATION OF WOUND VAC;  Surgeon: Clovis Pu. Cornett, MD;  Location: [facility];  Service: General;  Laterality: N/A;   Social History:  reports that she quit smoking about 3 weeks ago. Her smoking use included Cigarettes. She smoked 2.00 packs per day. She has never used smokeless tobacco. She reports that she does not drink alcohol or use illicit drugs.  where does  patient live--home Can patient participate in ADLs  Allergies  Allergen Reactions  . Tramadol     Per Dr. Threasa Beards is renal failure    Family History  Problem Relation Age of Onset  . Deep vein thrombosis Neg Hx   . Pulmonary embolism Neg Hx   . Diabetes Mother     Prior to Admission medications   Medication Sig Start Date End Date Taking? Authorizing Provider  amitriptyline (ELAVIL) 25 MG tablet Take 25 mg by mouth at bedtime.     Yes Historical Provider, MD  diazepam  (VALIUM) 10 MG tablet Try to take just 5 mg for anxiety, this also depresses your respiratory effort. 11/10/12  Yes Sherrie George, PA-C  omeprazole (PRILOSEC) 20 MG capsule Take 20 mg by mouth daily.     Yes Historical Provider, MD  oxyCODONE (OXY IR/ROXICODONE) 5 MG immediate release tablet Take 1-3 tablets (5-15 mg total) by mouth every 4 (four) hours as needed. 11/21/12  Yes Thomas A. Cornett, MD  polyethylene glycol (MIRALAX / GLYCOLAX) packet Use daily as needed to prevent constipation 11/10/12  Yes Sherrie George, PA-C  promethazine (PHENERGAN) 12.5 MG tablet Take 1 tablet (12.5 mg total) by mouth every 6 (six) hours as needed for nausea. 11/13/12  Yes Thomas A. Cornett, MD  psyllium (HYDROCIL/METAMUCIL) 95 % PACK Take 1 packet by mouth daily. 11/10/12  Yes Sherrie George, PA-C   Physical Exam: Filed Vitals:   11/25/12 0130 11/25/12 0851 11/25/12 1154  BP: 137/79 153/77 121/60  Pulse: 67 84 57  Temp: 97.9 F (36.6 C) 97.5 F (36.4 C)   TempSrc: Oral Oral   Resp:  16 16  Height: 5\' 4"  (1.626 m) 5\' 4"  (1.626 m)   Weight: 100.273 kg (221 lb 1 oz)    SpO2: 98% 94% 93%    Constitutional: Vital signs reviewed.  Patient is a well-developed and well-nourished in no acute distress and cooperative with exam. Alert and oriented x3.  Head: Normocephalic and atraumatic Mouth: no erythema or exudates, MMM Eyes: PERRL, EOMI, conjunctivae normal, No scleral icterus.  Neck: Supple, Trachea midline normal ROM, No JVD, mass, thyromegaly, or carotid bruit present.  Cardiovascular: RRR, S1 normal, S2 normal, no MRG, pulses symmetric and intact bilaterally Pulmonary/Chest: CTAB, no wheezes, rales, or rhonchi Abdominal: Soft. On R. Side of abd Parastomal wound (s/p surgery) with dressing clean and dry. Colostomy bag on L. With brownish stool and some black clumps noted, Non-tender, non-distended, bowel sounds are normal, no masses, organomegaly, or guarding present.  GU: no CVA tenderness Extremities:  no cyanosis, no edema Neurological: A&O x3, Strength is normal and symmetric bilaterally, cranial nerve II-XII are grossly intact, no focal motor deficit, sensory intact to light touch bilaterally.  Skin: Warm, dry and intact. No rash, cyanosis, or clubbing.  Psychiatric: Normal mood and affect. speech and behavior is normal.     Labs on Admission:  Basic Metabolic Panel:  Recent Labs Lab 11/25/12 0930  NA 139  K 3.4*  CL 103  CO2 25  GLUCOSE 161*  BUN 15  CREATININE 1.04  CALCIUM 9.3   Liver Function Tests:  Recent Labs Lab 11/25/12 0930  AST 13  ALT <5  ALKPHOS 78  BILITOT 0.3  PROT 7.4  ALBUMIN 3.0*    Recent Labs Lab 11/25/12 1107  LIPASE 23   No results found for this basename: AMMONIA,  in the last 168 hours CBC:  Recent Labs Lab 11/25/12 0930  WBC 5.9  NEUTROABS 3.8  HGB  13.0  HCT 39.9  MCV 88.3  PLT 255   Cardiac Enzymes: No results found for this basename: CKTOTAL, CKMB, CKMBINDEX, TROPONINI,  in the last 168 hours  BNP (last 3 results)  Recent Labs  11/05/12 0341  PROBNP 313.3*   CBG: No results found for this basename: GLUCAP,  in the last 168 hours  Radiological Exams on Admission: Dg Abd Acute W/chest  11/25/2012  *RADIOLOGY REPORT*  Clinical Data: Abdominal cramping and diarrhea.  ACUTE ABDOMEN SERIES (ABDOMEN 2 VIEW & CHEST 1 VIEW)  Comparison: Chest x-ray 11/11/2012.  Chest CT 11/02/2012.CT abdomen and pelvis 10/31/2012.  Findings: Complete resolution of the previously noted bibasilar opacities.  Mild persistent interstitial prominence and slight nodularity in the upper lungs, likely reflect some evolving scarring from recent multilobar infection.  No pleural effusions. Pulmonary vasculature is within normal limits.  Heart size and mediastinal contours are unremarkable.  Atherosclerosis of the thoracic aorta.  Left lower quadrant ostomy noted.  Gas and stool are seen scattered throughout the colon.  No pathologic distension of small  bowel.  No pneumoperitoneum.  No air fluid levels.  Surgical clips in the right upper quadrant of the abdomen are related to prior cholecystectomy.  IMPRESSION: 1.  Nonobstructive bowel gas pattern.  No pneumoperitoneum. 2.  Status post cholecystectomy. 3.  Left lower quadrant ostomy. 4.  Improving aeration in the lungs bilaterally, most compatible with resolving multilobar pneumonia. 5.  Atherosclerosis.   Original Report Authenticated By: Trudie Reed, M.D.       Assessment/Plan Principal Problem:   Heme positive stool/Diarrhea - C.diff done in ED -pt was taking peptobismuth which is the likely cause of the black stools, but given that guaic is + will cycle h/h follow - I have consulted [facility] - Dr Madilyn Fireman to see for further recs -she states her colostomy output is now decreased to baseline Active Problems: Generalized anxiety disorder -continue valium   COPD (chronic obstructive pulmonary disease) -stable, prn bronchodilators   Acid reflux disease -Place on PPI Hypokalemia -replace k H/O Parastomal Hernia with bowel obstruction, S/P recent surgery -continue dressing changes -follow up with Dr Luisa Hart as previously directed   Code Status: full Family Communication: none at bedside Disposition Plan: admit to tele for obsv  Time spent: >66mins  Kela Millin [facility] Pager [phone]  If 7PM-7AM, please contact night-coverage [email or web address] Password [facility] 11/25/2012, 2:54 PM

## 2012-11-25 NOTE — ED Provider Notes (Signed)
History     CSN: [id number]  Arrival date & time 11/25/12  0844   First MD Initiated Contact with Patient 11/25/12 [phone]      Chief Complaint  Patient presents with  . Diarrhea    (Consider location/radiation/quality/duration/timing/severity/associated sxs/prior treatment) HPI Pt with ostomy and open abd wound presents with diarrhea and discolored stool for several days. No current abd pain. No fever or chills. No vomiting. Pt denies blood in ostomy. States she take pepto-bismol.  Past Medical History  Diagnosis Date  . Diverticulitis   . Fibromyalgia   . Restless leg syndrome     Bilateral  . Asthma   . Bronchitis, chronic     Effects worse with URI  . Kidney stones     at least 6 stones in past  . Headache     migraine headache  . Acid reflux disease   . Anxiety   . COPD (chronic obstructive pulmonary disease)   . Fibromyalgia     Past Surgical History  Procedure Laterality Date  . Colostomy    . Appendectomy    . Cholecystectomy    . Partial hysterectomy      partial  . Colostomy closure N/A 11/01/2012    Procedure: COLOSTOMY CLOSURE;  Surgeon: Clovis Pu. Cornett, MD;  Location: [facility];  Service: General;  Laterality: N/A;  REPAIR PARASTOMAL HERNIA; REVISION OF COLOSTOMY  . Laparotomy  11/01/2012    Procedure: EXPLORATORY LAPAROTOMY;  Surgeon: Clovis Pu. Cornett, MD;  Location: [facility];  Service: General;;  exploratory laparotomy,repair of parastomal hernia and incisional hernia, lysis of adhesions, and application of wound V.A.C.  . Parastomal hernia repair N/A 11/01/2012    Procedure: HERNIA REPAIR PARASTOMAL;  Surgeon: Clovis Pu. Cornett, MD;  Location: [facility];  Service: General;  Laterality: N/A;  . Incisional hernia repair N/A 11/01/2012    Procedure: HERNIA REPAIR INCISIONAL;  Surgeon: Clovis Pu. Cornett, MD;  Location: [facility];  Service: General;  Laterality: N/A;  . Lysis of adhesion N/A 11/01/2012    Procedure: LYSIS OF ADHESION;  Surgeon: Clovis Pu. Cornett, MD;   Location: [facility];  Service: General;  Laterality: N/A;  . Application of wound vac N/A 11/01/2012    Procedure: APPLICATION OF WOUND VAC;  Surgeon: Clovis Pu. Cornett, MD;  Location: [facility];  Service: General;  Laterality: N/A;    Family History  Problem Relation Age of Onset  . Deep vein thrombosis Neg Hx   . Pulmonary embolism Neg Hx   . Diabetes Mother     History  Substance Use Topics  . Smoking status: Former Smoker -- 2.00 packs/day    Types: Cigarettes    Quit date: 10/31/2012  . Smokeless tobacco: Never Used  . Alcohol Use: No    OB History   Grav Para Term Preterm Abortions TAB SAB Ect Mult Living                  Review of Systems  Constitutional: Negative for fever and chills.  Gastrointestinal: Positive for diarrhea. Negative for nausea, vomiting, abdominal pain and blood in stool.  Skin: Negative for rash and wound.  All other systems reviewed and are negative.    Allergies  Tramadol  Home Medications   Current Outpatient Rx  Name  Route  Sig  Dispense  Refill  . amitriptyline (ELAVIL) 25 MG tablet   Oral   Take 25 mg by mouth at bedtime.           Marland Kitchen  diazepam (VALIUM) 10 MG tablet      Try to take just 5 mg for anxiety, this also depresses your respiratory effort.   30 tablet      . omeprazole (PRILOSEC) 20 MG capsule   Oral   Take 20 mg by mouth daily.           Marland Kitchen oxyCODONE (OXY IR/ROXICODONE) 5 MG immediate release tablet   Oral   Take 1-3 tablets (5-15 mg total) by mouth every 4 (four) hours as needed.   50 tablet   0   . polyethylene glycol (MIRALAX / GLYCOLAX) packet      Use daily as needed to prevent constipation   14 each      . promethazine (PHENERGAN) 12.5 MG tablet   Oral   Take 1 tablet (12.5 mg total) by mouth every 6 (six) hours as needed for nausea.   30 tablet   0   . psyllium (HYDROCIL/METAMUCIL) 95 % PACK   Oral   Take 1 packet by mouth daily.   56 each        BP 121/60  Pulse 57  Temp(Src) 97.5 F  (36.4 C) (Oral)  Resp 16  Ht 5\' 4"  (1.626 m)  SpO2 93%  LMP 09/21/2011  Physical Exam  Nursing note and vitals reviewed. Constitutional: She is oriented to person, place, and time. She appears well-developed and well-nourished. No distress.  HENT:  Head: Normocephalic and atraumatic.  Mouth/Throat: Oropharynx is clear and moist.  Eyes: EOM are normal. Pupils are equal, round, and reactive to light.  Neck: Normal range of motion. Neck supple.  Cardiovascular: Normal rate and regular rhythm.   Pulmonary/Chest: Effort normal and breath sounds normal. No respiratory distress. She has no wheezes. She has no rales.  Abdominal: Soft. Bowel sounds are normal. She exhibits distension. She exhibits no mass. There is no tenderness. There is no rebound and no guarding.  Central abd wound with wet to dry dressing. No evidence of infection or d/c. LLQ ostomy with stool in bag. No gross blood. Ostomy pink  Musculoskeletal: Normal range of motion. She exhibits no edema and no tenderness.  Neurological: She is alert and oriented to person, place, and time.  Skin: Skin is warm and dry. No rash noted. No erythema.  Psychiatric: She has a normal mood and affect. Her behavior is normal.    ED Course  Procedures (including critical care time)  Labs Reviewed  COMPREHENSIVE METABOLIC PANEL - Abnormal; Notable for the following:    Potassium 3.4 (*)    Glucose, Bld 161 (*)    Albumin 3.0 (*)    GFR calc non Af Amer 60 (*)    GFR calc Af Amer 70 (*)    All other components within normal limits  CBC WITH DIFFERENTIAL - Abnormal; Notable for the following:    Eosinophils Relative 6 (*)    All other components within normal limits  APTT - Abnormal; Notable for the following:    aPTT 39 (*)    All other components within normal limits  OCCULT BLOOD, POC DEVICE - Abnormal; Notable for the following:    Fecal Occult Bld POSITIVE (*)    All other components within normal limits  LIPASE, BLOOD   PROTIME-INR  OCCULT BLOOD X 1 CARD TO LAB, STOOL   Dg Abd Acute W/chest  11/25/2012  *RADIOLOGY REPORT*  Clinical Data: Abdominal cramping and diarrhea.  ACUTE ABDOMEN SERIES (ABDOMEN 2 VIEW & CHEST 1 VIEW)  Comparison: Chest  x-ray 11/11/2012.  Chest CT 11/02/2012.CT abdomen and pelvis 10/31/2012.  Findings: Complete resolution of the previously noted bibasilar opacities.  Mild persistent interstitial prominence and slight nodularity in the upper lungs, likely reflect some evolving scarring from recent multilobar infection.  No pleural effusions. Pulmonary vasculature is within normal limits.  Heart size and mediastinal contours are unremarkable.  Atherosclerosis of the thoracic aorta.  Left lower quadrant ostomy noted.  Gas and stool are seen scattered throughout the colon.  No pathologic distension of small bowel.  No pneumoperitoneum.  No air fluid levels.  Surgical clips in the right upper quadrant of the abdomen are related to prior cholecystectomy.  IMPRESSION: 1.  Nonobstructive bowel gas pattern.  No pneumoperitoneum. 2.  Status post cholecystectomy. 3.  Left lower quadrant ostomy. 4.  Improving aeration in the lungs bilaterally, most compatible with resolving multilobar pneumonia. 5.  Atherosclerosis.   Original Report Authenticated By: Trudie Reed, M.D.      1. GI bleeding   2. Diarrhea       MDM  Discussed with [facility] who will admit        Loren Racer, MD 11/25/12 1210

## 2012-11-25 NOTE — ED Notes (Signed)
Wound to abdomen pink moist wound bed. Cleansed with soap and water. Rinsed with NS and wet to dry dressing applied. C-diff sample sent

## 2012-11-25 NOTE — ED Notes (Signed)
Patient has diarrhea from colostomy with "funny odor".  Had blockage of ostomy which was repaired. Patient has a abdominal surgical wound beside ostomy. Stool from ostomy has gotten into surgical site due malfunction of her ostomy equipment.Marland Kitchen

## 2012-11-25 NOTE — ED Notes (Signed)
Patient refused to take protonix. Stated she has already taken her nexium.

## 2012-11-25 NOTE — ED Notes (Signed)
Report called to susan rn

## 2012-11-26 DIAGNOSIS — J45901 Unspecified asthma with (acute) exacerbation: Secondary | ICD-10-CM

## 2012-11-26 DIAGNOSIS — J810 Acute pulmonary edema: Secondary | ICD-10-CM

## 2012-11-26 DIAGNOSIS — J441 Chronic obstructive pulmonary disease with (acute) exacerbation: Secondary | ICD-10-CM

## 2012-11-26 DIAGNOSIS — K219 Gastro-esophageal reflux disease without esophagitis: Secondary | ICD-10-CM

## 2012-11-26 DIAGNOSIS — K922 Gastrointestinal hemorrhage, unspecified: Secondary | ICD-10-CM

## 2012-11-26 LAB — CBC
HCT: 37.7 % (ref 36.0–46.0)
Hemoglobin: 12.2 g/dL (ref 12.0–15.0)
MCH: 28.8 pg (ref 26.0–34.0)
MCHC: 32.4 g/dL (ref 30.0–36.0)
MCV: 88.9 fL (ref 78.0–100.0)
Platelets: 253 10*3/uL (ref 150–400)
RBC: 4.24 MIL/uL (ref 3.87–5.11)
RDW: 12.6 % (ref 11.5–15.5)
WBC: 6 10*3/uL (ref 4.0–10.5)

## 2012-11-26 LAB — BASIC METABOLIC PANEL
BUN: 15 mg/dL (ref 6–23)
CO2: 25 mEq/L (ref 19–32)
Calcium: 9 mg/dL (ref 8.4–10.5)
Chloride: 104 mEq/L (ref 96–112)
Creatinine, Ser: 1.11 mg/dL — ABNORMAL HIGH (ref 0.50–1.10)
GFR calc Af Amer: 65 mL/min — ABNORMAL LOW (ref >90)
GFR calc non Af Amer: 56 mL/min — ABNORMAL LOW (ref >90)
Glucose, Bld: 123 mg/dL — ABNORMAL HIGH (ref 70–99)
Potassium: 3.7 mEq/L (ref 3.5–5.1)
Sodium: 139 mEq/L (ref 135–145)

## 2012-11-26 MED ORDER — PANTOPRAZOLE SODIUM 40 MG IV SOLR
40.0000 mg | Freq: Two times a day (BID) | INTRAVENOUS | Status: DC
  Administered 2012-11-26 – 2012-11-28 (×5): 40 mg via INTRAVENOUS
  Filled 2012-11-26 (×6): qty 40

## 2012-11-26 NOTE — Progress Notes (Signed)
Patient ID: [name], female   DOB: [birth year], 52 y.o.   MRN: [id number]  [facility] PROGRESS NOTE  DEZARAI PREW NWG:[id number] DOB: 04-Jan-1961 DOA: 11/25/2012 PCP: [name], MD  Brief narrative: Pt is 52 y.o. female who presented with main concern of ongoing  Watery diarrhea associated with black stools that she first noticed one day prior to admission. She has been taking pepto bismol and goody powders (10-15 per day) for past 2 weeks. She has not had EGD done in the past. 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  In ED, she was found to be heme positive with stable Hg.Hct and normal BUN. GI consulted.   Principal Problem:   Heme positive stool - appreciate GI input - plan for EGD in AM Active Problems:   Acute renal failure - likely secondary to pre renal etiology in the setting of GI bleed and dehydration - will provide IVF today - BMP in AM   COPD (chronic obstructive pulmonary disease) - clinically stable and maintaining oxygen saturations at target range    Acid reflux disease - continue protonix but will change to IV   Hypokalemia - supplemented and within normal limits this AM - BMP in AM  Consultants:  GI  Procedures/Studies: Dg Abd Acute W/chest 11/25/2012   1.  Nonobstructive bowel gas pattern.  No pneumoperitoneum.  2.  Status post cholecystectomy.  3.  Left lower quadrant ostomy.  4.  Improving aeration in the lungs bilaterally, most compatible with resolving multilobar pneumonia.  5.  Atherosclerosis.    Antibiotics:  None  Code Status: Full Family Communication: Pt and husband at bedside Disposition Plan: Home when medically stable  HPI/Subjective: No events overnight.   Objective: Filed Vitals:   11/25/12 0851 11/25/12 1154 11/25/12 2341 11/26/12 0631  BP: 153/77 121/60 112/68 119/69  Pulse: 84 57 98 65  Temp: 97.5 F (36.4 C)  97.9 F (36.6 C) 97.7 F (36.5 C)  TempSrc: Oral  Oral Oral  Resp:  16 16 16 16   Height: 5\' 4"  (1.626 m)     Weight:      SpO2: 94% 93% 95% 95%    Intake/Output Summary (Last 24 hours) at 11/26/12 1017 Last data filed at 11/26/12 5784  Gross per 24 hour  Intake    300 ml  Output    650 ml  Net   -350 ml    Exam:   General:  Pt is alert, follows commands appropriately, not in acute distress  Cardiovascular: Regular rate and rhythm, S1/S2, no murmurs, no rubs, no gallops  Respiratory: Clear to auscultation bilaterally, no wheezing, no crackles, no rhonchi  Abdomen: Soft, non tender, non distended, bowel sounds present, no guarding, colostomy bag in place with dark stool  Extremities: No edema, pulses DP and PT palpable bilaterally  Neuro: Grossly nonfocal  Data Reviewed: Basic Metabolic Panel:  Recent Labs Lab 11/25/12 0930 11/26/12 0335  NA 139 139  K 3.4* 3.7  CL 103 104  CO2 25 25  GLUCOSE 161* 123*  BUN 15 15  CREATININE 1.04 1.11*  CALCIUM 9.3 9.0   Liver Function Tests:  Recent Labs Lab 11/25/12 0930  AST 13  ALT <5  ALKPHOS 78  BILITOT 0.3  PROT 7.4  ALBUMIN 3.0*    Recent Labs Lab 11/25/12 1107  LIPASE 23   No results found for this basename: AMMONIA,  in the last 168 hours CBC:  Recent Labs Lab 11/25/12 0930 11/25/12 1544  11/25/12 2058 11/26/12 0335  WBC 5.9  --   --  6.0  NEUTROABS 3.8  --   --   --   HGB 13.0 13.1 12.4 12.2  HCT 39.9 39.5 38.0 37.7  MCV 88.3  --   --  88.9  PLT 255  --   --  253    Recent Results (from the past 240 hour(s))  CLOSTRIDIUM DIFFICILE BY PCR     Status: None   Collection Time    11/25/12  1:10 PM      Result Value Range Status   C difficile by pcr NEGATIVE  NEGATIVE Final     Scheduled Meds: . amitriptyline  25 mg Oral QHS  . pantoprazole  40 mg Oral Daily  . psyllium  1 packet Oral Daily  . sodium chloride  3 mL Intravenous Q12H   Continuous Infusions: . sodium chloride       Debbora Presto, MD  [facility] Pager [phone]  If 7PM-7AM, please  contact night-coverage [email or web address] Password [email or web address] 11/26/2012, 10:17 AM   LOS: 1 day

## 2012-11-26 NOTE — Consult Note (Signed)
[facility] Consult Note  Referring Provider: No ref. provider found Primary Care Physician:  [name], MD Primary Gastroenterologist:  Dr.  Antony Contras Complaint: Black stools HPI: [name] is an 52 y.o. white  female  who presents with a 4-5 day history of black stools with foul smell and diarrhea and cramps. She states that she began taking Pepto-Bismol twice a day at the onset of diarrhea this is when she noticed her black stools. She was found to be heme positive with stable hemoglobin of 13 and a normal BUN. She states that she was taking 10-15 goody powders a day up until about 2 weeks ago. She's never had a EGD before. She had a colonoscopy through her colostomy in 2013 which showed diverticulosis and was otherwise normal. She does take Prilosec daily  Past Medical History  Diagnosis Date  . Diverticulitis   . Fibromyalgia   . Restless leg syndrome     Bilateral  . Asthma   . Bronchitis, chronic     Effects worse with URI  . Kidney stones     at least 6 stones in past  . Headache     migraine headache  . Acid reflux disease   . Anxiety   . COPD (chronic obstructive pulmonary disease)   . Fibromyalgia     Past Surgical History  Procedure Laterality Date  . Colostomy    . Appendectomy    . Cholecystectomy    . Partial hysterectomy      partial  . Colostomy closure N/A 11/01/2012    Procedure: COLOSTOMY CLOSURE;  Surgeon: Clovis Pu. Cornett, MD;  Location: [facility];  Service: General;  Laterality: N/A;  REPAIR PARASTOMAL HERNIA; REVISION OF COLOSTOMY  . Laparotomy  11/01/2012    Procedure: EXPLORATORY LAPAROTOMY;  Surgeon: Clovis Pu. Cornett, MD;  Location: [facility];  Service: General;;  exploratory laparotomy,repair of parastomal hernia and incisional hernia, lysis of adhesions, and application of wound V.A.C.  . Parastomal hernia repair N/A 11/01/2012    Procedure: HERNIA REPAIR PARASTOMAL;  Surgeon: Clovis Pu. Cornett, MD;  Location: [facility];  Service:  General;  Laterality: N/A;  . Incisional hernia repair N/A 11/01/2012    Procedure: HERNIA REPAIR INCISIONAL;  Surgeon: Clovis Pu. Cornett, MD;  Location: [facility];  Service: General;  Laterality: N/A;  . Lysis of adhesion N/A 11/01/2012    Procedure: LYSIS OF ADHESION;  Surgeon: Clovis Pu. Cornett, MD;  Location: [facility];  Service: General;  Laterality: N/A;  . Application of wound vac N/A 11/01/2012    Procedure: APPLICATION OF WOUND VAC;  Surgeon: Clovis Pu. Cornett, MD;  Location: [facility];  Service: General;  Laterality: N/A;    Medications Prior to Admission  Medication Sig Dispense Refill  . amitriptyline (ELAVIL) 25 MG tablet Take 25 mg by mouth at bedtime.        . diazepam (VALIUM) 10 MG tablet Try to take just 5 mg for anxiety, this also depresses your respiratory effort.  30 tablet    . omeprazole (PRILOSEC) 20 MG capsule Take 20 mg by mouth daily.        Marland Kitchen oxyCODONE (OXY IR/ROXICODONE) 5 MG immediate release tablet Take 1-3 tablets (5-15 mg total) by mouth every 4 (four) hours as needed.  50 tablet  0  . polyethylene glycol (MIRALAX / GLYCOLAX) packet Use daily as needed to prevent constipation  14 each    . promethazine (PHENERGAN) 12.5 MG tablet Take 1 tablet (12.5 mg total) by mouth  every 6 (six) hours as needed for nausea.  30 tablet  0  . psyllium (HYDROCIL/METAMUCIL) 95 % PACK Take 1 packet by mouth daily.  56 each      Allergies:  Allergies  Allergen Reactions  . Tramadol     Per Dr. Threasa Beards is renal failure    Family History  Problem Relation Age of Onset  . Deep vein thrombosis Neg Hx   . Pulmonary embolism Neg Hx   . Diabetes Mother     Social History:  reports that she quit smoking about 3 weeks ago. Her smoking use included Cigarettes. She smoked 2.00 packs per day. She has never used smokeless tobacco. She reports that she does not drink alcohol or use illicit drugs.  Review of Systems: negative except as above   Blood pressure 119/69, pulse 65,  temperature 97.7 F (36.5 C), temperature source Oral, resp. rate 16, height 5\' 4"  (1.626 m), weight 100.273 kg (221 lb 1 oz), last menstrual period 09/21/2011, SpO2 95.00%. Head: Normocephalic, without obvious abnormality, atraumatic Neck: no adenopathy, no carotid bruit, no JVD, supple, symmetrical, trachea midline and thyroid not enlarged, symmetric, no tenderness/mass/nodules Resp: clear to auscultation bilaterally Cardio: regular rate and rhythm, S1, S2 normal, no murmur, click, rub or gallop GI: Colostomy has dark brown and some darker brown stool mixed in. There is a large bandage over an open wound just to the right of the colostomy Extremities: extremities normal, atraumatic, no cyanosis or edema  Results for orders placed during the hospital encounter of 11/25/12 (from the past 48 hour(s))  COMPREHENSIVE METABOLIC PANEL     Status: Abnormal   Collection Time    11/25/12  9:30 AM      Result Value Range   Sodium 139  135 - 145 mEq/L   Potassium 3.4 (*) 3.5 - 5.1 mEq/L   Chloride 103  96 - 112 mEq/L   CO2 25  19 - 32 mEq/L   Glucose, Bld 161 (*) 70 - 99 mg/dL   BUN 15  6 - 23 mg/dL   Creatinine, Ser 1.61  0.50 - 1.10 mg/dL   Calcium 9.3  8.4 - 09.6 mg/dL   Total Protein 7.4  6.0 - 8.3 g/dL   Albumin 3.0 (*) 3.5 - 5.2 g/dL   AST 13  0 - 37 U/L   ALT <5  0 - 35 U/L   Comment: REPEATED TO VERIFY   Alkaline Phosphatase 78  39 - 117 U/L   Total Bilirubin 0.3  0.3 - 1.2 mg/dL   GFR calc non Af Amer 60 (*) >90 mL/min   GFR calc Af Amer 70 (*) >90 mL/min   Comment:            The eGFR has been calculated     using the CKD EPI equation.     This calculation has not been     validated in all clinical     situations.     eGFR's persistently     <90 mL/min signify     possible Chronic Kidney Disease.  CBC WITH DIFFERENTIAL     Status: Abnormal   Collection Time    11/25/12  9:30 AM      Result Value Range   WBC 5.9  4.0 - 10.5 K/uL   RBC 4.52  3.87 - 5.11 MIL/uL   Hemoglobin  13.0  12.0 - 15.0 g/dL   HCT 04.5  40.9 - 81.1 %   MCV 88.3  78.0 -  100.0 fL   MCH 28.8  26.0 - 34.0 pg   MCHC 32.6  30.0 - 36.0 g/dL   RDW 16.1  09.6 - 04.5 %   Platelets 255  150 - 400 K/uL   Neutrophils Relative 63  43 - 77 %   Neutro Abs 3.8  1.7 - 7.7 K/uL   Lymphocytes Relative 21  12 - 46 %   Lymphs Abs 1.2  0.7 - 4.0 K/uL   Monocytes Relative 9  3 - 12 %   Monocytes Absolute 0.6  0.1 - 1.0 K/uL   Eosinophils Relative 6 (*) 0 - 5 %   Eosinophils Absolute 0.4  0.0 - 0.7 K/uL   Basophils Relative 1  0 - 1 %   Basophils Absolute 0.0  0.0 - 0.1 K/uL  OCCULT BLOOD, POC DEVICE     Status: Abnormal   Collection Time    11/25/12 11:01 AM      Result Value Range   Fecal Occult Bld POSITIVE (*) NEGATIVE  LIPASE, BLOOD     Status: None   Collection Time    11/25/12 11:07 AM      Result Value Range   Lipase 23  11 - 59 U/L  PROTIME-INR     Status: None   Collection Time    11/25/12 11:07 AM      Result Value Range   Prothrombin Time 13.5  11.6 - 15.2 seconds   INR 1.04  0.00 - 1.49  APTT     Status: Abnormal   Collection Time    11/25/12 11:07 AM      Result Value Range   aPTT 39 (*) 24 - 37 seconds   Comment:            IF BASELINE aPTT IS ELEVATED,     SUGGEST PATIENT RISK ASSESSMENT     BE USED TO DETERMINE APPROPRIATE     ANTICOAGULANT THERAPY.  CLOSTRIDIUM DIFFICILE BY PCR     Status: None   Collection Time    11/25/12  1:10 PM      Result Value Range   C difficile by pcr NEGATIVE  NEGATIVE  HEMOGLOBIN AND HEMATOCRIT, BLOOD     Status: None   Collection Time    11/25/12  3:44 PM      Result Value Range   Hemoglobin 13.1  12.0 - 15.0 g/dL   HCT 40.9  81.1 - 91.4 %  HEMOGLOBIN AND HEMATOCRIT, BLOOD     Status: None   Collection Time    11/25/12  8:58 PM      Result Value Range   Hemoglobin 12.4  12.0 - 15.0 g/dL   HCT 78.2  95.6 - 21.3 %  CBC     Status: None   Collection Time    11/26/12  3:35 AM      Result Value Range   WBC 6.0  4.0 - 10.5 K/uL   RBC  4.24  3.87 - 5.11 MIL/uL   Hemoglobin 12.2  12.0 - 15.0 g/dL   HCT 08.6  57.8 - 46.9 %   MCV 88.9  78.0 - 100.0 fL   MCH 28.8  26.0 - 34.0 pg   MCHC 32.4  30.0 - 36.0 g/dL   RDW 62.9  52.8 - 41.3 %   Platelets 253  150 - 400 K/uL  BASIC METABOLIC PANEL     Status: Abnormal   Collection Time    11/26/12  3:35 AM  Result Value Range   Sodium 139  135 - 145 mEq/L   Potassium 3.7  3.5 - 5.1 mEq/L   Chloride 104  96 - 112 mEq/L   CO2 25  19 - 32 mEq/L   Glucose, Bld 123 (*) 70 - 99 mg/dL   BUN 15  6 - 23 mg/dL   Creatinine, Ser 5.40 (*) 0.50 - 1.10 mg/dL   Calcium 9.0  8.4 - 98.1 mg/dL   GFR calc non Af Amer 56 (*) >90 mL/min   GFR calc Af Amer 65 (*) >90 mL/min   Comment:            The eGFR has been calculated     using the CKD EPI equation.     This calculation has not been     validated in all clinical     situations.     eGFR's persistently     <90 mL/min signify     possible Chronic Kidney Disease.   Dg Abd Acute W/chest  11/25/2012  *RADIOLOGY REPORT*  Clinical Data: Abdominal cramping and diarrhea.  ACUTE ABDOMEN SERIES (ABDOMEN 2 VIEW & CHEST 1 VIEW)  Comparison: Chest x-ray 11/11/2012.  Chest CT 11/02/2012.CT abdomen and pelvis 10/31/2012.  Findings: Complete resolution of the previously noted bibasilar opacities.  Mild persistent interstitial prominence and slight nodularity in the upper lungs, likely reflect some evolving scarring from recent multilobar infection.  No pleural effusions. Pulmonary vasculature is within normal limits.  Heart size and mediastinal contours are unremarkable.  Atherosclerosis of the thoracic aorta.  Left lower quadrant ostomy noted.  Gas and stool are seen scattered throughout the colon.  No pathologic distension of small bowel.  No pneumoperitoneum.  No air fluid levels.  Surgical clips in the right upper quadrant of the abdomen are related to prior cholecystectomy.  IMPRESSION: 1.  Nonobstructive bowel gas pattern.  No pneumoperitoneum. 2.   Status post cholecystectomy. 3.  Left lower quadrant ostomy. 4.  Improving aeration in the lungs bilaterally, most compatible with resolving multilobar pneumonia. 5.  Atherosclerosis.   Original Report Authenticated By: Trudie Reed, M.D.     Assessment: Black stools probably attributable to Pepto-Bismol for the most part but with heme positivity and the nonspecific GI symptoms as well as high intake of BC powders. Plan:  We'll proceed with EGD tomorrow based on her significant intake of NSAIDs. Brittiany Wiehe C 11/26/2012, 10:07 AM

## 2012-11-26 NOTE — Progress Notes (Signed)
UR completed 

## 2012-11-27 ENCOUNTER — Encounter (HOSPITAL_COMMUNITY): Payer: Self-pay | Admitting: *Deleted

## 2012-11-27 ENCOUNTER — Encounter (HOSPITAL_COMMUNITY)
Admission: EM | Disposition: A | Discharge status: Home / Self Care | Payer: Self-pay | Source: Home / Self Care | Attending: Emergency Medicine

## 2012-11-27 HISTORY — PX: ESOPHAGOGASTRODUODENOSCOPY: SHX5428

## 2012-11-27 LAB — CBC
HCT: 38.6 % (ref 36.0–46.0)
Hemoglobin: 12.4 g/dL (ref 12.0–15.0)
MCH: 28.6 pg (ref 26.0–34.0)
MCHC: 32.1 g/dL (ref 30.0–36.0)
MCV: 89.1 fL (ref 78.0–100.0)
Platelets: 252 10*3/uL (ref 150–400)
RBC: 4.33 MIL/uL (ref 3.87–5.11)
RDW: 12.4 % (ref 11.5–15.5)
WBC: 7.7 10*3/uL (ref 4.0–10.5)

## 2012-11-27 LAB — BASIC METABOLIC PANEL
BUN: 16 mg/dL (ref 6–23)
CO2: 24 mEq/L (ref 19–32)
Calcium: 8.9 mg/dL (ref 8.4–10.5)
Chloride: 106 mEq/L (ref 96–112)
Creatinine, Ser: 1.24 mg/dL — ABNORMAL HIGH (ref 0.50–1.10)
GFR calc Af Amer: 56 mL/min — ABNORMAL LOW (ref >90)
GFR calc non Af Amer: 49 mL/min — ABNORMAL LOW (ref >90)
Glucose, Bld: 125 mg/dL — ABNORMAL HIGH (ref 70–99)
Potassium: 3.7 mEq/L (ref 3.5–5.1)
Sodium: 141 mEq/L (ref 135–145)

## 2012-11-27 LAB — MAGNESIUM: Magnesium: 1.9 mg/dL (ref 1.5–2.5)

## 2012-11-27 SURGERY — EGD (ESOPHAGOGASTRODUODENOSCOPY)
Anesthesia: Moderate Sedation | Laterality: Left

## 2012-11-27 MED ORDER — DIPHENHYDRAMINE HCL 50 MG/ML IJ SOLN
INTRAMUSCULAR | Status: AC
  Filled 2012-11-27: qty 1

## 2012-11-27 MED ORDER — BUTAMBEN-TETRACAINE-BENZOCAINE 2-2-14 % EX AERO
INHALATION_SPRAY | CUTANEOUS | Status: DC | PRN
  Administered 2012-11-27: 2 via TOPICAL

## 2012-11-27 MED ORDER — MIDAZOLAM HCL 10 MG/2ML IJ SOLN
INTRAMUSCULAR | Status: DC | PRN
  Administered 2012-11-27 (×3): 2.5 mg via INTRAVENOUS

## 2012-11-27 MED ORDER — FENTANYL CITRATE 0.05 MG/ML IJ SOLN
INTRAMUSCULAR | Status: DC | PRN
  Administered 2012-11-27 (×3): 25 ug via INTRAVENOUS

## 2012-11-27 MED ORDER — SODIUM CHLORIDE 0.9 % IV BOLUS (SEPSIS)
500.0000 mL | Freq: Once | INTRAVENOUS | Status: AC
  Administered 2012-11-27: 500 mL via INTRAVENOUS

## 2012-11-27 MED ORDER — MIDAZOLAM HCL 10 MG/2ML IJ SOLN
INTRAMUSCULAR | Status: AC
  Filled 2012-11-27: qty 4

## 2012-11-27 MED ORDER — FENTANYL CITRATE 0.05 MG/ML IJ SOLN
INTRAMUSCULAR | Status: AC
  Filled 2012-11-27: qty 4

## 2012-11-27 MED ORDER — SODIUM CHLORIDE 0.9 % IV SOLN: INTRAVENOUS | Status: DC

## 2012-11-27 MED ORDER — ENSURE COMPLETE PO LIQD: 237.0000 mL | Freq: Two times a day (BID) | ORAL | Status: DC

## 2012-11-27 MED ORDER — DIPHENHYDRAMINE HCL 50 MG/ML IJ SOLN
INTRAMUSCULAR | Status: DC | PRN
  Administered 2012-11-27 (×2): 25 mg via INTRAVENOUS

## 2012-11-27 NOTE — Interval H&P Note (Signed)
History and Physical Interval Note:  11/27/2012 10:22 AM  [name]  has presented today for surgery, with the diagnosis of Melena, heme positive stools  The various methods of treatment have been discussed with the patient and family. After consideration of risks, benefits and other options for treatment, the patient has consented to  Procedure(s): ESOPHAGOGASTRODUODENOSCOPY (EGD) (Left) as a surgical intervention .  The patient's history has been reviewed, patient examined, no change in status, stable for surgery.  I have reviewed the patient's chart and labs.  Questions were answered to the patient's satisfaction.     Kahla Risdon JR,Paddy Neis L

## 2012-11-27 NOTE — Interval H&P Note (Signed)
History and Physical Interval Note:  11/27/2012 10:21 AM  [name]  has presented today for surgery, with the diagnosis of Melena, heme positive stools  The various methods of treatment have been discussed with the patient and family. After consideration of risks, benefits and other options for treatment, the patient has consented to  Procedure(s): ESOPHAGOGASTRODUODENOSCOPY (EGD) (Left) as a surgical intervention .  The patient's history has been reviewed, patient examined, no change in status, stable for surgery.  I have reviewed the patient's chart and labs.  Questions were answered to the patient's satisfaction.     Desire Fulp JR,Davine Sweney L

## 2012-11-27 NOTE — H&P (View-Only) (Signed)
UR completed 

## 2012-11-27 NOTE — Progress Notes (Signed)
Patient noted to have 8 beats of SVT on cardiac monitor.  VSS, patient asymptomatic in NAD.  Will continue to monitor.

## 2012-11-27 NOTE — Progress Notes (Signed)
INITIAL NUTRITION ASSESSMENT  DOCUMENTATION CODES Per approved criteria  -Obesity Unspecified   INTERVENTION: Provide Ensure BID Encouraged PO intake >75% of meals  NUTRITION DIAGNOSIS: Unintentional wt loss related to decreased appetite as evidenced by 6% wt loss in less than 1 month.   Goal: Pt to meet >/= 90% of their estimated nutrition needs  Monitor:  Po intake Wt  Reason for Assessment: MST  52 y.o. female  Admitting Dx: Heme positive stool  ASSESSMENT: 52 y.o. female who presented with main concern of ongoing Watery diarrhea associated with black stools that she first noticed one day prior to admission. She has been taking pepto bismol and goody powders (10-15 per day) for past 2 weeks. She has not had EGD done in the past. 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 Pt reports that she has had a poor appetite since surgery 10/2012. Pt reports having a decreased appetite for the past 4-5 months due to a hernia and states her usual body weight is 250 lbs. PTA pt was following a diabetic diet at home because her parent have diabetes and was eating 3 meals daily. Pt reports eating about 50% of her meals.   Height: Ht Readings from Last 1 Encounters:  11/25/12 5\' 4"  (1.626 m)    Weight: Wt Readings from Last 1 Encounters:  11/25/12 221 lb 1 oz (100.273 kg)    Ideal Body Weight: 120 lbs  % Ideal Body Weight: 184%  Wt Readings from Last 10 Encounters:  11/25/12 221 lb 1 oz (100.273 kg)  11/25/12 221 lb 1 oz (100.273 kg)  11/21/12 226 lb 6.4 oz (102.694 kg)  11/06/12 234 lb (106.142 kg)  11/06/12 234 lb (106.142 kg)  12/14/11 230 lb (104.327 kg)  12/14/11 230 lb (104.327 kg)    Usual Body Weight: 250 lbs  % Usual Body Weight: 88%  BMI:  Body mass index is 37.93 kg/(m^2).  Estimated Nutritional Needs: Kcal: 1990 - 2325 Protein: 100-120 grams Fluid: 2.7 L  Skin: RLE and LLE +1 edema; abdominal wound  Diet Order: General  EDUCATION  NEEDS: -No education needs identified at this time   Intake/Output Summary (Last 24 hours) at 11/27/12 1706 Last data filed at 11/27/12 1500  Gross per 24 hour  Intake   1700 ml  Output      0 ml  Net   1700 ml    Last BM: 3/8 (colostomy)  Labs:   Recent Labs Lab 11/25/12 0930 11/26/12 0335 11/27/12 0430  NA 139 139 141  K 3.4* 3.7 3.7  CL 103 104 106  CO2 25 25 24   BUN 15 15 16   CREATININE 1.04 1.11* 1.24*  CALCIUM 9.3 9.0 8.9  MG  --   --  1.9  GLUCOSE 161* 123* 125*    CBG (last 3)  No results found for this basename: GLUCAP,  in the last 72 hours  Scheduled Meds: . amitriptyline  25 mg Oral QHS  . pantoprazole (PROTONIX) IV  40 mg Intravenous Q12H  . psyllium  1 packet Oral Daily  . sodium chloride  3 mL Intravenous Q12H    Continuous Infusions: . sodium chloride      Past Medical History  Diagnosis Date  . Diverticulitis   . Fibromyalgia   . Restless leg syndrome     Bilateral  . Asthma   . Bronchitis, chronic     Effects worse with URI  . Kidney stones     at least 6  stones in past  . Headache     migraine headache  . Acid reflux disease   . Anxiety   . COPD (chronic obstructive pulmonary disease)   . Fibromyalgia     Past Surgical History  Procedure Laterality Date  . Colostomy    . Appendectomy    . Cholecystectomy    . Partial hysterectomy      partial  . Colostomy closure N/A 11/01/2012    Procedure: COLOSTOMY CLOSURE;  Surgeon: Clovis Pu. Cornett, MD;  Location: [facility];  Service: General;  Laterality: N/A;  REPAIR PARASTOMAL HERNIA; REVISION OF COLOSTOMY  . Laparotomy  11/01/2012    Procedure: EXPLORATORY LAPAROTOMY;  Surgeon: Clovis Pu. Cornett, MD;  Location: [facility];  Service: General;;  exploratory laparotomy,repair of parastomal hernia and incisional hernia, lysis of adhesions, and application of wound V.A.C.  . Parastomal hernia repair N/A 11/01/2012    Procedure: HERNIA REPAIR PARASTOMAL;  Surgeon: Clovis Pu. Cornett, MD;   Location: [facility];  Service: General;  Laterality: N/A;  . Incisional hernia repair N/A 11/01/2012    Procedure: HERNIA REPAIR INCISIONAL;  Surgeon: Clovis Pu. Cornett, MD;  Location: [facility];  Service: General;  Laterality: N/A;  . Lysis of adhesion N/A 11/01/2012    Procedure: LYSIS OF ADHESION;  Surgeon: Clovis Pu. Cornett, MD;  Location: [facility];  Service: General;  Laterality: N/A;  . Application of wound vac N/A 11/01/2012    Procedure: APPLICATION OF WOUND VAC;  Surgeon: Clovis Pu. Cornett, MD;  Location: [facility];  Service: General;  Laterality: N/A;    Ian Malkin RD, LDN Inpatient Clinical Dietitian Pager: [phone] After Hours Pager: [phone]

## 2012-11-27 NOTE — H&P (View-Only) (Signed)
[facility] Consult Note  Referring Provider: No ref. provider found Primary Care Physician:  SHAW,W DOUGLAS, MD Primary Gastroenterologist:  Dr.  Chief Complaint: Black stools HPI: [name] is an 51 y.o. white  female  who presents with a 4-5 day history of black stools with foul smell and diarrhea and cramps. She states that she began taking Pepto-Bismol twice a day at the onset of diarrhea this is when she noticed her black stools. She was found to be heme positive with stable hemoglobin of 13 and a normal BUN. She states that she was taking 10-15 goody powders a day up until about 2 weeks ago. She's never had a EGD before. She had a colonoscopy through her colostomy in 2013 which showed diverticulosis and was otherwise normal. She does take Prilosec daily  Past Medical History  Diagnosis Date  . Diverticulitis   . Fibromyalgia   . Restless leg syndrome     Bilateral  . Asthma   . Bronchitis, chronic     Effects worse with URI  . Kidney stones     at least 6 stones in past  . Headache     migraine headache  . Acid reflux disease   . Anxiety   . COPD (chronic obstructive pulmonary disease)   . Fibromyalgia     Past Surgical History  Procedure Laterality Date  . Colostomy    . Appendectomy    . Cholecystectomy    . Partial hysterectomy      partial  . Colostomy closure N/A 11/01/2012    Procedure: COLOSTOMY CLOSURE;  Surgeon: Thomas A. Cornett, MD;  Location: [facility];  Service: General;  Laterality: N/A;  REPAIR PARASTOMAL HERNIA; REVISION OF COLOSTOMY  . Laparotomy  11/01/2012    Procedure: EXPLORATORY LAPAROTOMY;  Surgeon: Thomas A. Cornett, MD;  Location: [facility];  Service: General;;  exploratory laparotomy,repair of parastomal hernia and incisional hernia, lysis of adhesions, and application of wound V.A.C.  . Parastomal hernia repair N/A 11/01/2012    Procedure: HERNIA REPAIR PARASTOMAL;  Surgeon: Thomas A. Cornett, MD;  Location: [facility];  Service:  General;  Laterality: N/A;  . Incisional hernia repair N/A 11/01/2012    Procedure: HERNIA REPAIR INCISIONAL;  Surgeon: Thomas A. Cornett, MD;  Location: [facility];  Service: General;  Laterality: N/A;  . Lysis of adhesion N/A 11/01/2012    Procedure: LYSIS OF ADHESION;  Surgeon: Thomas A. Cornett, MD;  Location: [facility];  Service: General;  Laterality: N/A;  . Application of wound vac N/A 11/01/2012    Procedure: APPLICATION OF WOUND VAC;  Surgeon: Thomas A. Cornett, MD;  Location: [facility];  Service: General;  Laterality: N/A;    Medications Prior to Admission  Medication Sig Dispense Refill  . amitriptyline (ELAVIL) 25 MG tablet Take 25 mg by mouth at bedtime.        . diazepam (VALIUM) 10 MG tablet Try to take just 5 mg for anxiety, this also depresses your respiratory effort.  30 tablet    . omeprazole (PRILOSEC) 20 MG capsule Take 20 mg by mouth daily.        . oxyCODONE (OXY IR/ROXICODONE) 5 MG immediate release tablet Take 1-3 tablets (5-15 mg total) by mouth every 4 (four) hours as needed.  50 tablet  0  . polyethylene glycol (MIRALAX / GLYCOLAX) packet Use daily as needed to prevent constipation  14 each    . promethazine (PHENERGAN) 12.5 MG tablet Take 1 tablet (12.5 mg total) by mouth   every 6 (six) hours as needed for nausea.  30 tablet  0  . psyllium (HYDROCIL/METAMUCIL) 95 % PACK Take 1 packet by mouth daily.  56 each      Allergies:  Allergies  Allergen Reactions  . Tramadol     Per Dr. Ramaswamy/reaction is renal failure    Family History  Problem Relation Age of Onset  . Deep vein thrombosis Neg Hx   . Pulmonary embolism Neg Hx   . Diabetes Mother     Social History:  reports that she quit smoking about 3 weeks ago. Her smoking use included Cigarettes. She smoked 2.00 packs per day. She has never used smokeless tobacco. She reports that she does not drink alcohol or use illicit drugs.  Review of Systems: negative except as above   Blood pressure 119/69, pulse 65,  temperature 97.7 F (36.5 C), temperature source Oral, resp. rate 16, height 5' 4" (1.626 m), weight 100.273 kg (221 lb 1 oz), last menstrual period 09/21/2011, SpO2 95.00%. Head: Normocephalic, without obvious abnormality, atraumatic Neck: no adenopathy, no carotid bruit, no JVD, supple, symmetrical, trachea midline and thyroid not enlarged, symmetric, no tenderness/mass/nodules Resp: clear to auscultation bilaterally Cardio: regular rate and rhythm, S1, S2 normal, no murmur, click, rub or gallop GI: Colostomy has dark brown and some darker brown stool mixed in. There is a large bandage over an open wound just to the right of the colostomy Extremities: extremities normal, atraumatic, no cyanosis or edema  Results for orders placed during the hospital encounter of 11/25/12 (from the past 48 hour(s))  COMPREHENSIVE METABOLIC PANEL     Status: Abnormal   Collection Time    11/25/12  9:30 AM      Result Value Range   Sodium 139  135 - 145 mEq/L   Potassium 3.4 (*) 3.5 - 5.1 mEq/L   Chloride 103  96 - 112 mEq/L   CO2 25  19 - 32 mEq/L   Glucose, Bld 161 (*) 70 - 99 mg/dL   BUN 15  6 - 23 mg/dL   Creatinine, Ser 1.04  0.50 - 1.10 mg/dL   Calcium 9.3  8.4 - 10.5 mg/dL   Total Protein 7.4  6.0 - 8.3 g/dL   Albumin 3.0 (*) 3.5 - 5.2 g/dL   AST 13  0 - 37 U/L   ALT <5  0 - 35 U/L   Comment: REPEATED TO VERIFY   Alkaline Phosphatase 78  39 - 117 U/L   Total Bilirubin 0.3  0.3 - 1.2 mg/dL   GFR calc non Af Amer 60 (*) >90 mL/min   GFR calc Af Amer 70 (*) >90 mL/min   Comment:            The eGFR has been calculated     using the CKD EPI equation.     This calculation has not been     validated in all clinical     situations.     eGFR's persistently     <90 mL/min signify     possible Chronic Kidney Disease.  CBC WITH DIFFERENTIAL     Status: Abnormal   Collection Time    11/25/12  9:30 AM      Result Value Range   WBC 5.9  4.0 - 10.5 K/uL   RBC 4.52  3.87 - 5.11 MIL/uL   Hemoglobin  13.0  12.0 - 15.0 g/dL   HCT 39.9  36.0 - 46.0 %   MCV 88.3  78.0 -   100.0 fL   MCH 28.8  26.0 - 34.0 pg   MCHC 32.6  30.0 - 36.0 g/dL   RDW 12.4  11.5 - 15.5 %   Platelets 255  150 - 400 K/uL   Neutrophils Relative 63  43 - 77 %   Neutro Abs 3.8  1.7 - 7.7 K/uL   Lymphocytes Relative 21  12 - 46 %   Lymphs Abs 1.2  0.7 - 4.0 K/uL   Monocytes Relative 9  3 - 12 %   Monocytes Absolute 0.6  0.1 - 1.0 K/uL   Eosinophils Relative 6 (*) 0 - 5 %   Eosinophils Absolute 0.4  0.0 - 0.7 K/uL   Basophils Relative 1  0 - 1 %   Basophils Absolute 0.0  0.0 - 0.1 K/uL  OCCULT BLOOD, POC DEVICE     Status: Abnormal   Collection Time    11/25/12 11:01 AM      Result Value Range   Fecal Occult Bld POSITIVE (*) NEGATIVE  LIPASE, BLOOD     Status: None   Collection Time    11/25/12 11:07 AM      Result Value Range   Lipase 23  11 - 59 U/L  PROTIME-INR     Status: None   Collection Time    11/25/12 11:07 AM      Result Value Range   Prothrombin Time 13.5  11.6 - 15.2 seconds   INR 1.04  0.00 - 1.49  APTT     Status: Abnormal   Collection Time    11/25/12 11:07 AM      Result Value Range   aPTT 39 (*) 24 - 37 seconds   Comment:            IF BASELINE aPTT IS ELEVATED,     SUGGEST PATIENT RISK ASSESSMENT     BE USED TO DETERMINE APPROPRIATE     ANTICOAGULANT THERAPY.  CLOSTRIDIUM DIFFICILE BY PCR     Status: None   Collection Time    11/25/12  1:10 PM      Result Value Range   C difficile by pcr NEGATIVE  NEGATIVE  HEMOGLOBIN AND HEMATOCRIT, BLOOD     Status: None   Collection Time    11/25/12  3:44 PM      Result Value Range   Hemoglobin 13.1  12.0 - 15.0 g/dL   HCT 39.5  36.0 - 46.0 %  HEMOGLOBIN AND HEMATOCRIT, BLOOD     Status: None   Collection Time    11/25/12  8:58 PM      Result Value Range   Hemoglobin 12.4  12.0 - 15.0 g/dL   HCT 38.0  36.0 - 46.0 %  CBC     Status: None   Collection Time    11/26/12  3:35 AM      Result Value Range   WBC 6.0  4.0 - 10.5 K/uL   RBC  4.24  3.87 - 5.11 MIL/uL   Hemoglobin 12.2  12.0 - 15.0 g/dL   HCT 37.7  36.0 - 46.0 %   MCV 88.9  78.0 - 100.0 fL   MCH 28.8  26.0 - 34.0 pg   MCHC 32.4  30.0 - 36.0 g/dL   RDW 12.6  11.5 - 15.5 %   Platelets 253  150 - 400 K/uL  BASIC METABOLIC PANEL     Status: Abnormal   Collection Time    11/26/12  3:35 AM        Result Value Range   Sodium 139  135 - 145 mEq/L   Potassium 3.7  3.5 - 5.1 mEq/L   Chloride 104  96 - 112 mEq/L   CO2 25  19 - 32 mEq/L   Glucose, Bld 123 (*) 70 - 99 mg/dL   BUN 15  6 - 23 mg/dL   Creatinine, Ser 1.11 (*) 0.50 - 1.10 mg/dL   Calcium 9.0  8.4 - 10.5 mg/dL   GFR calc non Af Amer 56 (*) >90 mL/min   GFR calc Af Amer 65 (*) >90 mL/min   Comment:            The eGFR has been calculated     using the CKD EPI equation.     This calculation has not been     validated in all clinical     situations.     eGFR's persistently     <90 mL/min signify     possible Chronic Kidney Disease.   Dg Abd Acute W/chest  11/25/2012  *RADIOLOGY REPORT*  Clinical Data: Abdominal cramping and diarrhea.  ACUTE ABDOMEN SERIES (ABDOMEN 2 VIEW & CHEST 1 VIEW)  Comparison: Chest x-ray 11/11/2012.  Chest CT 11/02/2012.CT abdomen and pelvis 10/31/2012.  Findings: Complete resolution of the previously noted bibasilar opacities.  Mild persistent interstitial prominence and slight nodularity in the upper lungs, likely reflect some evolving scarring from recent multilobar infection.  No pleural effusions. Pulmonary vasculature is within normal limits.  Heart size and mediastinal contours are unremarkable.  Atherosclerosis of the thoracic aorta.  Left lower quadrant ostomy noted.  Gas and stool are seen scattered throughout the colon.  No pathologic distension of small bowel.  No pneumoperitoneum.  No air fluid levels.  Surgical clips in the right upper quadrant of the abdomen are related to prior cholecystectomy.  IMPRESSION: 1.  Nonobstructive bowel gas pattern.  No pneumoperitoneum. 2.   Status post cholecystectomy. 3.  Left lower quadrant ostomy. 4.  Improving aeration in the lungs bilaterally, most compatible with resolving multilobar pneumonia. 5.  Atherosclerosis.   Original Report Authenticated By: Daniel Entrikin, M.D.     Assessment: Black stools probably attributable to Pepto-Bismol for the most part but with heme positivity and the nonspecific GI symptoms as well as high intake of BC powders. Plan:  We'll proceed with EGD tomorrow based on her significant intake of NSAIDs. Shyquan Stallbaumer C 11/26/2012, 10:07 AM    

## 2012-11-27 NOTE — Progress Notes (Signed)
Patient ID: [name], female   DOB: Sep 10, 1961, 52 y.o.   MRN: [id number]  [facility] PROGRESS NOTE  JUANETTE URIZAR JYN:[id number] DOB: [birth year] DOA: 11/25/2012 PCP: [name], MD Brief narrative:  Pt is 52 y.o. female who presented with main concern of ongoing Watery diarrhea associated with black stools that she first noticed one day prior to admission. She has been taking pepto bismol and goody powders (10-15 per day) for past 2 weeks. She has not had EGD done in the past. 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   In ED, she was found to be heme positive with stable Hg.Hct and normal BUN. GI consulted.   Principal Problem:  Heme positive stool  - appreciate GI input  - plan for EGD today, will follow up on results  Active Problems:  Acute renal failure  - likely secondary to pre renal etiology in the setting of GI bleed and dehydration  - will continue IVF as creatinine is trending up from admission value  - BMP in AM  COPD (chronic obstructive pulmonary disease)  - clinically stable and maintaining oxygen saturations at target range  Acid reflux disease  - continue protonix IV, follow up on EGD results  Hypokalemia  - supplemented and within normal limits this AM, BMP in AM   Consultants:  GI Procedures/Studies:  Dg Abd Acute W/chest 11/25/2012  1. Nonobstructive bowel gas pattern. No pneumoperitoneum.  2. Status post cholecystectomy.  3. Left lower quadrant ostomy.  4. Improving aeration in the lungs bilaterally, most compatible with resolving multilobar pneumonia.  5. Atherosclerosis.  Antibiotics:  None  Code Status: Full  Family Communication: Pt and husband at bedside  Disposition Plan: Home when medically stable   HPI/Subjective: No events overnight.   Objective: Filed Vitals:   11/27/12 1056 11/27/12 1100 11/27/12 1115 11/27/12 1130  BP: 117/65 117/65 108/66 128/77  Pulse:      Temp: 97.6 F (36.4 C)      TempSrc:      Resp: 13 15 14 24   Height:      Weight:      SpO2: 95% 97% 96% 94%    Intake/Output Summary (Last 24 hours) at 11/27/12 1256 Last data filed at 11/27/12 1100  Gross per 24 hour  Intake    100 ml  Output      0 ml  Net    100 ml    Exam:   General:  Pt is alert, follows commands appropriately, not in acute distress  Cardiovascular: Regular rate and rhythm, S1/S2, no murmurs, no rubs, no gallops  Respiratory: Clear to auscultation bilaterally, no wheezing, no crackles, no rhonchi  Abdomen: Soft, non tender, non distended, bowel sounds present, no guarding, ostomy bag in place   Extremities: No edema, pulses DP and PT palpable bilaterally  Neuro: Grossly nonfocal  Data Reviewed: Basic Metabolic Panel:  Recent Labs Lab 11/25/12 0930 11/26/12 0335 11/27/12 0430  NA 139 139 141  K 3.4* 3.7 3.7  CL 103 104 106  CO2 25 25 24   GLUCOSE 161* 123* 125*  BUN 15 15 16   CREATININE 1.04 1.11* 1.24*  CALCIUM 9.3 9.0 8.9  MG  --   --  1.9   Liver Function Tests:  Recent Labs Lab 11/25/12 0930  AST 13  ALT <5  ALKPHOS 78  BILITOT 0.3  PROT 7.4  ALBUMIN 3.0*    Recent Labs Lab 11/25/12 1107  LIPASE 23   CBC:  Recent Labs Lab 11/25/12 0930 11/25/12 1544 11/25/12 2058 11/26/12 0335 11/27/12 0430  WBC 5.9  --   --  6.0 7.7  NEUTROABS 3.8  --   --   --   --   HGB 13.0 13.1 12.4 12.2 12.4  HCT 39.9 39.5 38.0 37.7 38.6  MCV 88.3  --   --  88.9 89.1  PLT 255  --   --  253 252    Recent Results (from the past 240 hour(s))  CLOSTRIDIUM DIFFICILE BY PCR     Status: None   Collection Time    11/25/12  1:10 PM      Result Value Range Status   C difficile by pcr NEGATIVE  NEGATIVE Final     Scheduled Meds: . amitriptyline  25 mg Oral QHS  . pantoprazole (PROTONIX) IV  40 mg Intravenous Q12H  . psyllium  1 packet Oral Daily  . sodium chloride  3 mL Intravenous Q12H   Continuous Infusions: . sodium chloride       Debbora Presto,  MD  [facility] Pager [phone]  If 7PM-7AM, please contact night-coverage [email or web address] Password [email or web address] 11/27/2012, 12:56 PM   LOS: 2 days

## 2012-11-27 NOTE — Op Note (Signed)
Date: 11/27/12 Procedure: EGD Medications: Benadryl 50 mg, and they'll 75 mcg, Versed 7.5 mg, Cetacaine spray Indication: heme positive stool and woman who has been taking NSAIDs  Description procedure: procedure had been explained to the patient and consider obtain. This note is dictated in Epic because the endoscopy staff is unable to make and EndoPro functional. Pentax upper scope inserted finally and passed into the duodenum. Duodenum including the bulb and 2nd portion, pyloric channel, antrum and body of the stomach were normal. Retroflex view was normal. There was no ulceration or inflammation in the esophagus. The patient tolerated procedure well.   Assessment: Positive stool with out any obvious finding on EGD  Plan: will feed the patient and follow clinically.  CC: Dr. Buren Kos

## 2012-11-28 ENCOUNTER — Encounter (HOSPITAL_COMMUNITY): Payer: Self-pay | Admitting: Gastroenterology

## 2012-11-28 LAB — CBC
HCT: 34.9 % — ABNORMAL LOW (ref 36.0–46.0)
Hemoglobin: 11.3 g/dL — ABNORMAL LOW (ref 12.0–15.0)
MCH: 28.6 pg (ref 26.0–34.0)
MCHC: 32.4 g/dL (ref 30.0–36.0)
MCV: 88.4 fL (ref 78.0–100.0)
Platelets: 216 10*3/uL (ref 150–400)
RBC: 3.95 MIL/uL (ref 3.87–5.11)
RDW: 12.6 % (ref 11.5–15.5)
WBC: 6 10*3/uL (ref 4.0–10.5)

## 2012-11-28 LAB — BASIC METABOLIC PANEL
BUN: 14 mg/dL (ref 6–23)
CO2: 25 mEq/L (ref 19–32)
Calcium: 8.5 mg/dL (ref 8.4–10.5)
Chloride: 109 mEq/L (ref 96–112)
Creatinine, Ser: 1.11 mg/dL — ABNORMAL HIGH (ref 0.50–1.10)
GFR calc Af Amer: 65 mL/min — ABNORMAL LOW (ref >90)
GFR calc non Af Amer: 56 mL/min — ABNORMAL LOW (ref >90)
Glucose, Bld: 128 mg/dL — ABNORMAL HIGH (ref 70–99)
Potassium: 3.7 mEq/L (ref 3.5–5.1)
Sodium: 143 mEq/L (ref 135–145)

## 2012-11-28 MED ORDER — OXYCODONE HCL 5 MG PO TABS: 5.0000 mg | ORAL_TABLET | ORAL | Status: DC | PRN

## 2012-11-28 MED ORDER — DIAZEPAM 10 MG PO TABS: ORAL_TABLET | ORAL | Status: DC

## 2012-11-28 NOTE — Discharge Summary (Signed)
Physician Discharge Summary  [name] ZOX:[id number] DOB: 1961-01-26 DOA: 11/25/2012  PCP: [name], MD  Admit date: 11/25/2012 Discharge date: 11/28/2012  Recommendations for Outpatient Follow-up:  1. Pt will need to follow up with PCP in 2-3 weeks post discharge 2. Please obtain BMP to evaluate electrolytes and kidney function 3. Please also check CBC to evaluate Hg and Hct levels 4. Has follow up appointment scheduled with Dr. Randa Evens GI  Discharge Diagnoses:  Principal Problem:   Heme positive stool Active Problems:   Fibromyalgia   Generalized anxiety disorder   COPD (chronic obstructive pulmonary disease)   Acid reflux disease   Hypokalemia  Discharge Condition: Stable  Diet recommendation: Heart healthy diet discussed in details   Brief narrative:  Pt is 52 y.o. female who presented with main concern of ongoing Watery diarrhea associated with black stools that she first noticed one day prior to admission. She has been taking pepto bismol and goody powders (10-15 per day) for past 2 weeks. She has not had EGD done in the past. 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  In ED, she was found to be heme positive with stable Hg.Hct and normal BUN. GI consulted.   Principal Problem:  Heme positive stool  - appreciate GI input  - EGD done by Dr. Randa Evens and no obvious findings noted - per GI recommendations pt is ok for d/c with close follow up and needs repeat CBC - may need repeat colonoscopy or capsule endoscopy Active Problems:  Acute renal failure  - likely secondary to pre renal etiology in the setting of GI bleed and dehydration  - will continue IVF as creatinine is trending down from admission value  - BMP can be obtained on follow up  COPD (chronic obstructive pulmonary disease)  - clinically stable and maintaining oxygen saturations at target range  Acid reflux disease  - continue protonix Hypokalemia  - supplemented and within  normal limits this AM  Consultants:  GI Procedures/Studies:  Dg Abd Acute W/chest 11/25/2012  1. Nonobstructive bowel gas pattern. No pneumoperitoneum.  2. Status post cholecystectomy.  3. Left lower quadrant ostomy.  4. Improving aeration in the lungs bilaterally, most compatible with resolving multilobar pneumonia.  5. Atherosclerosis.  Antibiotics:  None  Code Status: Full  Family Communication: Pt and husband at bedside    Discharge Exam: Filed Vitals:   11/28/12 1348  BP: 122/58  Pulse: 74  Temp: 99 F (37.2 C)  Resp: 18   Filed Vitals:   11/27/12 1130 11/27/12 2039 11/28/12 0527 11/28/12 1348  BP: 128/77 116/58 108/61 122/58  Pulse:  71 66 74  Temp:  98.1 F (36.7 C) 98.2 F (36.8 C) 99 F (37.2 C)  TempSrc:  Oral Oral Oral  Resp: 24 20 20 18   Height:      Weight:      SpO2: 94% 97% 97% 98%    General: Pt is alert, follows commands appropriately, not in acute distress Cardiovascular: Regular rate and rhythm, S1/S2 +, no murmurs, no rubs, no gallops Respiratory: Clear to auscultation bilaterally, no wheezing, no crackles, no rhonchi Abdominal: Soft, non tender, non distended, bowel sounds +, no guarding Extremities: no edema, no cyanosis, pulses palpable bilaterally DP and PT Neuro: Grossly nonfocal  Discharge Instructions  Discharge Orders   Future Appointments Provider Department Dept Phone   12/18/2012 2:00 PM Kalman Shan, MD [facility] [phone]   12/22/2012 11:30 AM Maisie Fus A. Cornett, MD [facility]  [place] [phone]   Future Orders Complete By Expires     Diet - low sodium heart healthy  As directed     Increase activity slowly  As directed         Medication List    TAKE these medications       amitriptyline 25 MG tablet  Commonly known as:  ELAVIL  Take 25 mg by mouth at bedtime.     diazepam 10 MG tablet  Commonly known as:  VALIUM  Try to take just 5 mg for anxiety, this also depresses your respiratory  effort.     omeprazole 20 MG capsule  Commonly known as:  PRILOSEC  Take 20 mg by mouth daily.     oxyCODONE 5 MG immediate release tablet  Commonly known as:  Oxy IR/ROXICODONE  Take 1-3 tablets (5-15 mg total) by mouth every 4 (four) hours as needed for pain.     polyethylene glycol packet  Commonly known as:  MIRALAX / GLYCOLAX  Use daily as needed to prevent constipation     promethazine 12.5 MG tablet  Commonly known as:  PHENERGAN  Take 1 tablet (12.5 mg total) by mouth every 6 (six) hours as needed for nausea.     psyllium 95 % Pack  Commonly known as:  HYDROCIL/METAMUCIL  Take 1 packet by mouth daily.           Follow-up Information   Follow up with [name], MD In 2 weeks.   Contact information:   [address] [facility] [place] [address] [phone]        The results of significant diagnostics from this hospitalization (including imaging, microbiology, ancillary and laboratory) are listed below for reference.     Microbiology: Recent Results (from the past 240 hour(s))  CLOSTRIDIUM DIFFICILE BY PCR     Status: None   Collection Time    11/25/12  1:10 PM      Result Value Range Status   C difficile by pcr NEGATIVE  NEGATIVE Final     Labs: Basic Metabolic Panel:  Recent Labs Lab 11/25/12 0930 11/26/12 0335 11/27/12 0430 11/28/12 0427  NA 139 139 141 143  K 3.4* 3.7 3.7 3.7  CL 103 104 106 109  CO2 25 25 24 25   GLUCOSE 161* 123* 125* 128*  BUN 15 15 16 14   CREATININE 1.04 1.11* 1.24* 1.11*  CALCIUM 9.3 9.0 8.9 8.5  MG  --   --  1.9  --    Liver Function Tests:  Recent Labs Lab 11/25/12 0930  AST 13  ALT <5  ALKPHOS 78  BILITOT 0.3  PROT 7.4  ALBUMIN 3.0*    Recent Labs Lab 11/25/12 1107  LIPASE 23   No results found for this basename: AMMONIA,  in the last 168 hours CBC:  Recent Labs Lab 11/25/12 0930 11/25/12 1544 11/25/12 2058 11/26/12 0335 11/27/12 0430 11/28/12 0427  WBC  5.9  --   --  6.0 7.7 6.0  NEUTROABS 3.8  --   --   --   --   --   HGB 13.0 13.1 12.4 12.2 12.4 11.3*  HCT 39.9 39.5 38.0 37.7 38.6 34.9*  MCV 88.3  --   --  88.9 89.1 88.4  PLT 255  --   --  253 252 216   Cardiac Enzymes: No results found for this basename: CKTOTAL, CKMB, CKMBINDEX, TROPONINI,  in the last 168 hours BNP: BNP (last 3 results)  Recent Labs  11/05/12 0341  PROBNP 313.3*   CBG: No results found for this basename: GLUCAP,  in the last 168 hours   SIGNED: Time coordinating discharge: Over 30 minutes  Debbora Presto, MD  [facility] 11/28/2012, 1:51 PM Pager [phone]  If 7PM-7AM, please contact night-coverage [email or web address] Password [email or web address]

## 2012-11-28 NOTE — Op Note (Signed)
[facility] [address] [place], [address]   ENDOSCOPY PROCEDURE REPORT  PATIENT: [name], [name]  MR#: [id number] BIRTHDATE: [birth year] , 51  yrs. old GENDER: Female ENDOSCOPIST: Carman Ching, MD REFERRED BY: PROCEDURE DATE:  11/27/2012 PROCEDURE: ASA CLASS: INDICATIONS: MEDICATIONS: TOPICAL ANESTHETIC:  DESCRIPTION OF PROCEDURE: After the risks benefits and alternatives of the procedure were thoroughly explained, informed consent was obtained.  The Pentax Gastroscope [id number] endoscope was introduced through the mouth and advanced to the   . Without limitations.  The instrument was slowly withdrawn as the mucosa was fully examined.      The scope was then withdrawn from the patient and the procedure completed.  COMPLICATIONS: There were no complications. ENDOSCOPIC IMPRESSION:  RECOMMENDATIONS:  REPEAT EXAM:  eSigned:  Carman Ching, MD 11/28/2012 11:00 AM   CC:

## 2012-11-28 NOTE — Progress Notes (Signed)
[facility] PROGRESS NOTE Subjective patient doing well after EGD no real pain or bleeding.  Objective: Vital signs in last 24 hours: Temp:  [98.1 F (36.7 C)-98.2 F (36.8 C)] 98.2 F (36.8 C) (03/11 0527) Pulse Rate:  [66-71] 66 (03/11 0527) Resp:  [20] 20 (03/11 0527) BP: (108-116)/(58-61) 108/61 mmHg (03/11 0527) SpO2:  [97 %] 97 % (03/11 0527) Last BM Date: 11/28/12  Intake/Output from previous day: 03/10 0701 - 03/11 0700 In: 2600 [P.O.:100; I.V.:2500] Out: -  Intake/Output this shift: Total I/O In: 480 [P.O.:480] Out: -    Lab Results:  Recent Labs  11/25/12 1544 11/25/12 2058 11/26/12 0335 11/27/12 0430 11/28/12 0427  WBC  --   --  6.0 7.7 6.0  HGB 13.1 12.4 12.2 12.4 11.3*  HCT 39.5 38.0 37.7 38.6 34.9*  PLT  --   --  253 252 216   BMET  Recent Labs  11/26/12 0335 11/27/12 0430 11/28/12 0427  NA 139 141 143  K 3.7 3.7 3.7  CL 104 106 109  CO2 25 24 25   CREATININE 1.11* 1.24* 1.11*   LFT No results found for this basename: PROT, AST, ALT, ALKPHOS, BILITOT, BILIDIR, IBILI,  in the last 72 hours PT/INR No results found for this basename: LABPROT, INR,  in the last 72 hours PANCREAS No results found for this basename: LIPASE,  in the last 72 hours       Studies/Results: No results found.  Medications: I have reviewed the patient's current medications.  Assessment/Plan: 1. Heme positive stool. EGD negative. She had colonoscopy in the past and seems to be doing well. Would have her follow-up with her family doctor and for stool remains positive we may need to consider repeating her colonoscopy. Should be okay for discharge.   [name],[name] 11/28/2012, 12:10 PM

## 2012-12-18 ENCOUNTER — Ambulatory Visit (INDEPENDENT_AMBULATORY_CARE_PROVIDER_SITE_OTHER): Payer: 59 | Admitting: Internal Medicine

## 2012-12-18 ENCOUNTER — Encounter: Payer: Self-pay | Admitting: Internal Medicine

## 2012-12-18 VITALS — BP 124/80 | HR 69 | Ht 64.0 in | Wt 228.2 lb

## 2012-12-18 DIAGNOSIS — Z01811 Encounter for preprocedural respiratory examination: Secondary | ICD-10-CM

## 2012-12-18 DIAGNOSIS — J449 Chronic obstructive pulmonary disease, unspecified: Secondary | ICD-10-CM

## 2012-12-18 DIAGNOSIS — IMO0001 Reserved for inherently not codable concepts without codable children: Secondary | ICD-10-CM | POA: Insufficient documentation

## 2012-12-18 DIAGNOSIS — J9811 Atelectasis: Secondary | ICD-10-CM

## 2012-12-18 DIAGNOSIS — J9819 Other pulmonary collapse: Secondary | ICD-10-CM

## 2012-12-18 DIAGNOSIS — F17200 Nicotine dependence, unspecified, uncomplicated: Secondary | ICD-10-CM | POA: Insufficient documentation

## 2012-12-18 DIAGNOSIS — R911 Solitary pulmonary nodule: Secondary | ICD-10-CM

## 2012-12-18 NOTE — Patient Instructions (Addendum)
#  Lung collapse and nodule  - do CT scan chest mid may 2014  #Smoking  - quit e cigs as well  #Possible COPD  - glad cough is resolvd after quitting smoking  - have PFT mid [id number]; a breathing test  #Preoperative pulmonary risk assessment  - low - moderate risk for pulmponary complications with repeat abdominal surgery  #Followuo  - May 2014 with CT chest and PFT

## 2012-12-18 NOTE — Assessment & Plan Note (Signed)
She had acute right middle lobe and right lower lobe atelectasis following emergency laparotomy in mid February 2014. Follow chest x-ray early March 2014 shows this resolved. However given history of smoking and presence of lung nodule she needs a followup CT scan of the chest mid May 2014 which I have ordered

## 2012-12-18 NOTE — Assessment & Plan Note (Signed)
She quit conventional cigarettes after laparotomy in 2014. She was scared of the pulmonary complications. However she is smoking electronic cigarettes with the assumption that this is safe. I have warned her that this is no more safe then conventional cigarettes but is a good stepping stone towards quitting  Smoking. She has declined help with nicotine patch her medications and will work on her own to quit smoking  3 minutes face-to-face counseling about quitting smoking

## 2012-12-18 NOTE — Assessment & Plan Note (Signed)
Though she developed major respiratory complication with lung collapse following emergency laparotomy she should be at low-moderate risk for any future pulmonary complications from laparotomy because of quitting smoking, resolved cough, elective surgery and intact nutritional status and normal oxygen status. However her hospitalization in February March 2014 will be a risk factor against her.

## 2012-12-18 NOTE — Assessment & Plan Note (Signed)
She is at risk for COPD. However after to conventional cigarettes for cough and mucus production was all. Currently she has mild dyspnea that could easily be attributed to deconditioning. I will obtain pulmonary function test in a few months to determine diagnosis and extent of COPD is present

## 2012-12-18 NOTE — Progress Notes (Signed)
Subjective:    Patient ID: [name], female    DOB: April 09, 1961, 52 y.o.   MRN: [id number]  HPI   OV 12/18/2012  Obese smoker hospital followup following emergent hernia repair and peritonitis mid February 2014. Her postoperative course at that time was complicated by right middle and lower lobe collapse. CT scan of the chest mid February 2014 ruled out pulmonary embolism but did document a collapse along with less than 4 mm nodules in the right upper lobe and left lower lobe.  Since that time she has continued to do well. Her functional status is improved and her ECoG score is 1. His COPD cat score is 2. Walking desaturation test 185 feet x3 laps is normal without any desaturation. She's not using any inhalers. She does not have shortness of breath. She has some mild fatigue but she attributes to her postoperative state. She used to have chronic cough and chronic mucus production but since the surgery she quit conventional cigarettes and her cough and sputum production have resolved. She is currently smoking electronic cigarettes and she wonders if this is okay.  She did have a followup chest x-ray 11/25/2012 that she was hospitalized for some complications related to her colostomy. This chest x-ray showed complete resolution of the collapse. Of note, she scheduled for repeat laparotomy with reversal of colostomy sometime in the future.  She and her husband extremely concerned about risk of lung cancer    CAT COPD Symptom & Quality of Life Score ([employer] trademark) 0 is no burden. 5 is highest burden 12/18/2012   Never Cough -> Cough all the time 0  No phlegm in chest -> Chest is full of phlegm 0  No chest tightness -> Chest feels very tight 01  No dyspnea for 1 flight stairs/hill -> Very dyspneic for 1 flight of stairs 0  No limitations for ADL at home -> Very limited with ADL at home 0  Confident leaving home -> Not at all confident leaving home 0  Sleep soundly -> Do not sleep  soundly because of lung condition 0  Lots of Energy -> No energy at all 1  TOTAL Score (max 40)  2      Review of Systems  Constitutional: Negative for fever and unexpected weight change.  HENT: Negative for ear pain, nosebleeds, congestion, sore throat, rhinorrhea, sneezing, trouble swallowing, dental problem, postnasal drip and sinus pressure.   Eyes: Negative for redness and itching.  Respiratory: Negative for cough, chest tightness, shortness of breath and wheezing.   Cardiovascular: Negative for palpitations and leg swelling.  Gastrointestinal: Negative for nausea and vomiting.  Genitourinary: Negative for dysuria.  Musculoskeletal: Negative for joint swelling.  Skin: Negative for rash.  Neurological: Negative for headaches.  Hematological: Does not bruise/bleed easily.  Psychiatric/Behavioral: Negative for dysphoric mood. The patient is not nervous/anxious.        Objective:   Physical Exam  Vitals reviewed. Constitutional: She is oriented to person, place, and time. She appears well-developed and well-nourished. No distress.  Obese. Body mass index is 39.15 kg/(m^2). Looking more conditioned   HENT:  Head: Normocephalic and atraumatic.  Right Ear: External ear normal.  Left Ear: External ear normal.  Mouth/Throat: Oropharynx is clear and moist. No oropharyngeal exudate.  Eyes: Conjunctivae and EOM are normal. Pupils are equal, round, and reactive to light. Right eye exhibits no discharge. Left eye exhibits no discharge. No scleral icterus.  Neck: Normal range of motion. Neck supple. No JVD present. No tracheal  deviation present. No thyromegaly present.  Cardiovascular: Normal rate, regular rhythm, normal heart sounds and intact distal pulses.  Exam reveals no gallop and no friction rub.   No murmur heard. Pulmonary/Chest: Effort normal and breath sounds normal. No respiratory distress. She has no wheezes. She has no rales. She exhibits no tenderness.  Abdominal: Soft.  Bowel sounds are normal. She exhibits no distension and no mass. There is no tenderness. There is no rebound and no guarding.  Wound incision present  Musculoskeletal: Normal range of motion. She exhibits no edema and no tenderness.  Lymphadenopathy:    She has no cervical adenopathy.  Neurological: She is alert and oriented to person, place, and time. She has normal reflexes. No cranial nerve deficit. She exhibits normal muscle tone. Coordination normal.  Skin: Skin is warm and dry. No rash noted. She is not diaphoretic. No erythema. No pallor.  Psychiatric: She has a normal mood and affect. Her behavior is normal. Judgment and thought content normal.          Assessment & Plan:

## 2012-12-22 ENCOUNTER — Ambulatory Visit (INDEPENDENT_AMBULATORY_CARE_PROVIDER_SITE_OTHER): Payer: 59 | Admitting: Surgery

## 2012-12-22 ENCOUNTER — Encounter (INDEPENDENT_AMBULATORY_CARE_PROVIDER_SITE_OTHER): Payer: Self-pay | Admitting: Surgery

## 2012-12-22 VITALS — BP 128/84 | HR 71 | Temp 97.6°F | Resp 16 | Ht 64.0 in | Wt 228.8 lb

## 2012-12-22 DIAGNOSIS — Z933 Colostomy status: Secondary | ICD-10-CM

## 2012-12-22 MED ORDER — OXYCODONE-ACETAMINOPHEN 5-325 MG PO TABS: 1.0000 | ORAL_TABLET | ORAL | Status: DC | PRN

## 2012-12-22 NOTE — Progress Notes (Signed)
Patient returns in followup of her parastomal hernia and small bowel obstruction. She's doing well. She was admitted to the hospital with GI bleeding in a source was never found. She does take BC powder on a regular basis. She feels well today. She stopped smoking. Was seen by her pulmonologist who cleared her for surgery. Does have some right lower quadrant pain. Made worse with movement.  Exam: Midline wound is healing well. It is and is completely closed. Left lower quadrant colostomy noted. No evidence of recurrent hernia currently.  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 Patient desires colostomy closure  Plan: Barium enema. Return to clinic one month.

## 2012-12-22 NOTE — Patient Instructions (Signed)
Return 1 month.  Will schedule barium enema.

## 2013-01-26 ENCOUNTER — Encounter (INDEPENDENT_AMBULATORY_CARE_PROVIDER_SITE_OTHER): Payer: Self-pay | Admitting: Surgery

## 2013-01-26 ENCOUNTER — Ambulatory Visit (INDEPENDENT_AMBULATORY_CARE_PROVIDER_SITE_OTHER): Payer: 59 | Admitting: Surgery

## 2013-01-26 VITALS — BP 130/84 | HR 68 | Temp 97.2°F | Resp 16 | Ht 65.0 in | Wt 232.8 lb

## 2013-01-26 DIAGNOSIS — Z9889 Other specified postprocedural states: Secondary | ICD-10-CM

## 2013-01-26 NOTE — Progress Notes (Signed)
Patient returns in followup of her parastomal hernia and small bowel obstruction. She's doing well. She was admitted to the hospital with GI bleeding in a source was never found. She does take BC powder on a regular basis. She feels well today. She stopped smoking. Was seen by her pulmonologist who cleared her for surgery. Does have some right lower quadrant pain. Made worse with movement.  Exam: Midline wound is healing well. It is and is completely closed. Left lower quadrant colostomy noted. No evidence of recurrent hernia currently.  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 Patient desires colostomy closure  Plan: Barium enema. Return to clinic one month. 

## 2013-01-26 NOTE — Patient Instructions (Signed)
End Colostomy Reversal An end colostomy reversal is surgery that reverses an end colostomy. The large intestine is disconnected from the opening in the abdomen (stoma). It is then reconnected to the large intestine inside the body. A stoma and pouch are no longer needed. Bowel movements can resume through the rectum. LET YOUR CAREGIVER KNOW ABOUT:  Allergies to food or medicine.  Medicines taken, including vitamins, health supplements, herbs, eyedrops, over-the-counter medicines, and creams.  Use of steroids (by mouth or creams).  Previous problems with anesthetics or numbing medicines.  History of bleeding problems or blood clots.  Previous surgery.  Other health problems, including diabetes and kidney problems.  Possibility of pregnancy, if this applies. RISKS AND COMPLICATIONS General surgical complications may include:  Reaction to anesthesia.  Damage to surrounding nerves, tissues, or structures.  Blood clot.  Bleeding.  Scarring. Specific risks for colostomy reversal, while rare, may include:  Intestinal paralysis (ileus). This is a normal part of recovery. It usually goes away in 3 to 7 days. However, it can last longer in some people.  Leaking at the joined part of the intestine (anastamotic leak).  Infection of the surgical cut (incision) or the place where the stoma was located.  A collection of pus (abscess) in the abdomen or pelvis.  Intestinal blockage.  Narrowing at the joined part of the intestine (stricture).  Urinary and sexual dysfunction. BEFORE THE PROCEDURE It is important to follow your surgeon's instructions prior to your procedure. This will help you to avoid complications. Steps before your procedure may include:  A physical exam, rectal exam, X-rays, colonoscopy, and other procedures.  Chemotherapy or radiation therapy if the stoma was created for cancer.  A review of the procedure, the anesthesia being used, and what to expect after the  procedure. You may be asked to:  Stop taking certain medicines for several days prior to your procedure. This may include blood thinners (such as aspirin).  Take certain medicines, such as antibiotics or stool softeners.  Avoid eating and drinking after midnight the night before the procedure. This will help you to avoid complications from the anesthesia.  Quit smoking. Smoking increases the chances of a healing problem after your procedure. PROCEDURE  You will be given medicine that makes you sleep (general anesthetic). The procedure may be done as open surgery, with a large incision. It may also be done as laparoscopic surgery, with several smaller incisions. The surgeon will stitch or staple the intestine ends back together. This surgery takes several hours. AFTER THE PROCEDURE  You will be given pain medicine.  Your caregivers will slowly increase your diet and movement.  You can expect to be in the hospital for about 5 to 10 days.  You should arrange for someone to help you with activities at home while you recover. Document Released: 11/29/2011 Document Reviewed: 11/29/2011 [employer] Patient Information 2013 [employer].  

## 2013-01-30 ENCOUNTER — Encounter (INDEPENDENT_AMBULATORY_CARE_PROVIDER_SITE_OTHER): Payer: Self-pay | Admitting: Surgery

## 2013-01-30 ENCOUNTER — Ambulatory Visit (INDEPENDENT_AMBULATORY_CARE_PROVIDER_SITE_OTHER): Payer: 59 | Admitting: Surgery

## 2013-01-30 VITALS — BP 134/86 | HR 80 | Temp 99.3°F | Resp 16 | Ht 65.0 in | Wt 231.6 lb

## 2013-01-30 DIAGNOSIS — S31109A Unspecified open wound of abdominal wall, unspecified quadrant without penetration into peritoneal cavity, initial encounter: Secondary | ICD-10-CM

## 2013-01-30 DIAGNOSIS — T8131XA Disruption of external operation (surgical) wound, not elsewhere classified, initial encounter: Secondary | ICD-10-CM | POA: Insufficient documentation

## 2013-01-30 MED ORDER — CIPROFLOXACIN HCL 500 MG PO TABS: 500.0000 mg | ORAL_TABLET | Freq: Two times a day (BID) | ORAL | Status: DC

## 2013-01-30 NOTE — Patient Instructions (Signed)
Resume same saline dressings date you have previously using. Start on your antibiotics today. Let Dr. Luisa Hart know how you're doing in a few days.

## 2013-01-30 NOTE — Progress Notes (Signed)
Chief complaint: Wound is opened up and become somewhat painful  History of present illness: This patient has had a slowly healing midline abdominal wound following surgery a few months ago. She was here a few days ago and it appeared to have completely closed up. However she has now developed drainage again with some tenderness and pain around the lower right side of the midline wound. She has not had any definite fever.  Exam: Vital signs:BP 134/86  Pulse 80  Temp(Src) 99.3 F (37.4 C)  Resp 16  Ht 5\' 5"  (1.651 m)  Wt 231 lb 9.6 oz (105.053 kg)  BMI 38.54 kg/m2  LMP 09/21/2011 Gen.: Patient alert oriented healthy-appearing Abdomen: She has some mild diffuse tenderness. There is a 1.5 x 6 cm area to the right of the midline scar that has opened up but just has some old granulation tissue present. This will do redness on the other side but the skin is intact. Photodocumentation was then placed into the electronic medical record. There is not appear to be any fluctuance or abscess but there is purulent drainage on the surface.  Impression: Scan with dysfunction of uncertain etiology unlikely it is not some underlying infection  Plan: She'll go back to daily dressing change to saline as she was doing previously. I will put her on Cipro 500 twice a day and have her follow up with Dr. Harriette Bouillon next week.

## 2013-02-05 ENCOUNTER — Other Ambulatory Visit: Payer: 59

## 2013-02-05 ENCOUNTER — Telehealth (INDEPENDENT_AMBULATORY_CARE_PROVIDER_SITE_OTHER): Payer: Self-pay

## 2013-02-05 ENCOUNTER — Telehealth: Payer: Self-pay | Admitting: Internal Medicine

## 2013-02-05 NOTE — Telephone Encounter (Signed)
Patient is still having drainage from incision denies foul odor, fever Advised to keep area clean and dry to call if any foul odor , temp greater than 100.6 or any concerns or changes in condition

## 2013-02-05 NOTE — Telephone Encounter (Signed)
I will forward this message to MR so that he will be aware of this information.

## 2013-02-07 NOTE — Telephone Encounter (Signed)
lmtcb x1 for pt. 

## 2013-02-07 NOTE — Telephone Encounter (Signed)
Please ensure she has her scan   Dr. Kalman Shan, M.D., [facility].C.P Pulmonary and Critical Care Medicine Staff Physician [facility] Pager: [phone], If no answer or between  15:00h - 7:00h: call [phone]  02/07/2013 4:54 PM

## 2013-02-08 NOTE — Telephone Encounter (Signed)
LMTCBX2 on only # in chart.Carron Curie, CMA

## 2013-02-09 ENCOUNTER — Ambulatory Visit (INDEPENDENT_AMBULATORY_CARE_PROVIDER_SITE_OTHER): Payer: 59 | Admitting: Surgery

## 2013-02-09 ENCOUNTER — Encounter (INDEPENDENT_AMBULATORY_CARE_PROVIDER_SITE_OTHER): Payer: Self-pay | Admitting: Surgery

## 2013-02-09 VITALS — BP 142/84 | HR 78 | Temp 97.7°F | Resp 14 | Ht 64.0 in | Wt 233.4 lb

## 2013-02-09 DIAGNOSIS — T8131XD Disruption of external operation (surgical) wound, not elsewhere classified, subsequent encounter: Secondary | ICD-10-CM

## 2013-02-09 DIAGNOSIS — R1031 Right lower quadrant pain: Secondary | ICD-10-CM

## 2013-02-09 NOTE — Telephone Encounter (Signed)
LMOM TCB x3  Per 4.24.14 ov w/ MR: Complete atelectasis - Christina Shan, MD at 12/18/2012  2:44 PM        She had acute right middle lobe and right lower lobe atelectasis following emergency laparotomy in mid February 2014. Follow chest x-ray early March 2014 shows this resolved. However given history of smoking and presence of lung nodule she needs a followup CT scan of the chest mid May 2014 which I have ordered

## 2013-02-09 NOTE — Patient Instructions (Signed)
Return one month.

## 2013-02-09 NOTE — Progress Notes (Signed)
Patient returns in followup of her parastomal hernia and small bowel obstruction. She's doing well. She was admitted to the hospital with GI bleeding in a source was never found. She does take BC powder on a regular basis. She feels well today. She stopped smoking. Was seen by her pulmonologist who cleared her for surgery. Does have some right lower quadrant pain. Made worse with movement. Seen last week by Dr Jamey Ripa due to wound dehiscence and placed on antibiotics.  She is doing better.  Exam: two small areas of granulation notes in scar.  No redness or drainage. Left lower quadrant colostomy noted. No evidence of recurrent hernia currently.  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 Patient desires colostomy closure.  Small wound dehiscence.    Plan: Barium enema. Return to clinic 2 weeks.  Apply neosporin to wound daily.

## 2013-02-13 ENCOUNTER — Encounter: Payer: Self-pay | Admitting: *Deleted

## 2013-02-13 ENCOUNTER — Ambulatory Visit: Payer: 59 | Admitting: Internal Medicine

## 2013-02-13 NOTE — Telephone Encounter (Signed)
Per protocol will sign off on message and send letter. Carron Curie, CMA

## 2013-02-16 ENCOUNTER — Encounter (INDEPENDENT_AMBULATORY_CARE_PROVIDER_SITE_OTHER): Payer: 59 | Admitting: Surgery

## 2013-02-16 ENCOUNTER — Other Ambulatory Visit (INDEPENDENT_AMBULATORY_CARE_PROVIDER_SITE_OTHER): Payer: Self-pay | Admitting: Surgery

## 2013-02-16 ENCOUNTER — Ambulatory Visit
Admission: RE | Admit: 2013-02-16 | Discharge: 2013-02-16 | Disposition: A | Discharge status: Home / Self Care | Payer: 59 | Source: Ambulatory Visit | Attending: Surgery | Admitting: Surgery

## 2013-02-16 DIAGNOSIS — Z933 Colostomy status: Secondary | ICD-10-CM

## 2013-02-26 ENCOUNTER — Ambulatory Visit (INDEPENDENT_AMBULATORY_CARE_PROVIDER_SITE_OTHER): Payer: 59 | Admitting: Surgery

## 2013-02-26 ENCOUNTER — Encounter (INDEPENDENT_AMBULATORY_CARE_PROVIDER_SITE_OTHER): Payer: Self-pay | Admitting: Surgery

## 2013-02-26 VITALS — BP 128/72 | HR 78 | Temp 98.3°F | Resp 16 | Ht 65.0 in | Wt 234.2 lb

## 2013-02-26 DIAGNOSIS — Z933 Colostomy status: Secondary | ICD-10-CM

## 2013-02-26 MED ORDER — AMOXICILLIN-POT CLAVULANATE 875-125 MG PO TABS: 1.0000 | ORAL_TABLET | Freq: Two times a day (BID) | ORAL | Status: AC

## 2013-02-26 NOTE — Progress Notes (Signed)
Patient returns in followup of her parastomal hernia and small bowel obstruction. She's doing well. She was admitted to the hospital with GI bleeding in a source was never found. She does take BC powder on a regular basis. She feels well today. She stopped smoking. Was seen by her pulmonologist who cleared her for surgery. Does have some right lower quadrant pain. Made worse with movement. Seen last week by Dr Jamey Ripa due to wound dehiscence and placed on antibiotics.  She is doing better. Clinical Data:Colostomy, for reversal  BARIUM ENEMA THROUGH COLOSTOMY  Fluoroscopy Time: 4 minutes 18 seconds  Comparison: CT abdomen pelvis of 10/31/2012  Findings: A preliminary film of the abdomen shows a nonspecific  bowel gas pattern. Surgical clips are present in the right upper  quadrant from prior cholecystectomy.  Initially a barium enema via the rectum was performed. The small  portion of the remaining rectosigmoid colon is well visualized with  a few residual diverticula noted. Postevacuation film shows no  significant abnormality.  A barium enema was then performed via the ostomy. Distention is  not optimal, but there are scattered diverticula within the  transverse and right colon. There is considerable spasm of the  right colon but no persistent abnormality is evident on the images  obtained.  IMPRESSION:  1. Negative barium enema via the rectum with only a few  rectosigmoid diverticula remaining.  2. Suboptimal distention of the ascending colon, with diverticula  remaining throughout the transverse and hepatic flexure of colon.  Considerable spasm is noted within the right colon.  Original Report Authenticated By: Dwyane Dee, M.D.  Exam: two small areas of granulation notes in scar.  No redness or drainage. Left lower quadrant colostomy noted. No evidence of recurrent hernia currently.  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 Patient desires colostomy closure.  Small wound dehiscence.    Plan: Barium enema. Return to clinic 3 weeks.  Two weeks of augmentin.  Return 3 weeks to set up colostomy closure end of July.

## 2013-02-26 NOTE — Patient Instructions (Signed)
Return 2 - 3 weeks.  Take augmentin until gone. No smoking.

## 2013-03-19 ENCOUNTER — Encounter (INDEPENDENT_AMBULATORY_CARE_PROVIDER_SITE_OTHER): Payer: Self-pay | Admitting: Surgery

## 2013-03-19 ENCOUNTER — Ambulatory Visit (INDEPENDENT_AMBULATORY_CARE_PROVIDER_SITE_OTHER): Payer: 59 | Admitting: Surgery

## 2013-03-19 VITALS — BP 132/60 | HR 76 | Temp 98.0°F | Resp 16 | Ht 65.0 in | Wt 237.5 lb

## 2013-03-19 DIAGNOSIS — L03319 Cellulitis of trunk, unspecified: Secondary | ICD-10-CM

## 2013-03-19 DIAGNOSIS — L02219 Cutaneous abscess of trunk, unspecified: Secondary | ICD-10-CM

## 2013-03-19 MED ORDER — OXYCODONE-ACETAMINOPHEN 7.5-325 MG PO TABS: 2.0000 | ORAL_TABLET | ORAL | Status: DC | PRN

## 2013-03-19 MED ORDER — DOXYCYCLINE HYCLATE 50 MG PO CAPS: 100.0000 mg | ORAL_CAPSULE | Freq: Two times a day (BID) | ORAL | Status: DC

## 2013-03-19 NOTE — Progress Notes (Signed)
Subjective:     Patient ID: [name], female   DOB: 1961/06/20, 52 y.o.   MRN: [id number]  HPI Patient returns to clinic. She's developing swelling and pain just the right of her world midline incision. This started 3 days ago. No fever or chills. The pain is severe especially when you push on this area. She does have nausea with the pain. There is redness around this area.  Review of Systems  Constitutional: Positive for fatigue.  Gastrointestinal: Positive for abdominal pain.  Musculoskeletal: Negative.        Objective:   Physical Exam  Constitutional: She is oriented to person, place, and time.  HENT:  Head: Normocephalic.  Neck: Normal range of motion.  Abdominal:    Neurological: She is alert and oriented to person, place, and time.  Skin: Skin is warm and dry.       Assessment:     3 cm subcutaneous abdominal abscess    Plan:     Recommended incision and drainage in office today. Risks, benefits and alternatives discussed. She agreed. Under sterile conditions the area just the right of the old scar was prepped in a sterile fashion. 1% lidocaine was used without epinephrine injected. A 1 cm incision was made in the area of fullness and fluctuance. The cavity was encountered and turbid fluid expressed. No frank pus. The cavity was about 3-4 cm. This is packed with quarter-inch packing. Cultures sent. Procedure tolerated well. Will place on doxycycline 100 mg by mouth twice a day for 10 days. Percocet 7.5/325 given for pain #30 one to 2 tablets every 4 hours as needed. We'll schedule her CT scanning since she's had chronic right lower quadrant pain to exclude other complicating issue. If pain worsens inserted patient is to be admitted to the hospital. Return one week.

## 2013-03-19 NOTE — Patient Instructions (Signed)
Remove packing in 3 days.  Cover with clean band aid.  Take antibiotics until gone.  Will set up CT of abdomen.  If pain worsens you will require admission to hospital.

## 2013-03-20 ENCOUNTER — Ambulatory Visit
Admission: RE | Admit: 2013-03-20 | Discharge: 2013-03-20 | Disposition: A | Discharge status: Home / Self Care | Payer: 59 | Source: Ambulatory Visit | Attending: Surgery | Admitting: Surgery

## 2013-03-20 DIAGNOSIS — L02219 Cutaneous abscess of trunk, unspecified: Secondary | ICD-10-CM

## 2013-03-20 MED ORDER — IOHEXOL 300 MG/ML  SOLN
125.0000 mL | Freq: Once | INTRAMUSCULAR | Status: AC | PRN
  Administered 2013-03-20: 125 mL via INTRAVENOUS

## 2013-03-22 ENCOUNTER — Telehealth (INDEPENDENT_AMBULATORY_CARE_PROVIDER_SITE_OTHER): Payer: Self-pay

## 2013-03-22 LAB — WOUND CULTURE
Gram Stain: NONE SEEN
Gram Stain: NONE SEEN
Gram Stain: NONE SEEN
Organism ID, Bacteria: NO GROWTH

## 2013-03-22 NOTE — Telephone Encounter (Signed)
Called patient and gave CT results. Patient states still on abx but pain still there and no better. Moved her follow up appt up to Monday 7/7.

## 2013-03-22 NOTE — Telephone Encounter (Signed)
Message copied by Brennan Bailey on Thu Mar 22, 2013 11:20 AM ------      Message from: Harriette Bouillon A      Created: Wed Mar 21, 2013  1:07 PM       Looks ok.  Pain probably from the small infection at the site ------

## 2013-03-26 ENCOUNTER — Encounter (INDEPENDENT_AMBULATORY_CARE_PROVIDER_SITE_OTHER): Payer: Self-pay | Admitting: Surgery

## 2013-03-26 ENCOUNTER — Ambulatory Visit (INDEPENDENT_AMBULATORY_CARE_PROVIDER_SITE_OTHER): Payer: 59 | Admitting: Surgery

## 2013-03-26 ENCOUNTER — Telehealth (INDEPENDENT_AMBULATORY_CARE_PROVIDER_SITE_OTHER): Payer: Self-pay | Admitting: General Surgery

## 2013-03-26 VITALS — BP 138/82 | HR 80 | Temp 98.2°F | Resp 16 | Ht 66.0 in | Wt 241.6 lb

## 2013-03-26 DIAGNOSIS — IMO0001 Reserved for inherently not codable concepts without codable children: Secondary | ICD-10-CM

## 2013-03-26 DIAGNOSIS — Z933 Colostomy status: Secondary | ICD-10-CM

## 2013-03-26 DIAGNOSIS — T8149XA Infection following a procedure, other surgical site, initial encounter: Secondary | ICD-10-CM

## 2013-03-26 DIAGNOSIS — T889XXS Complication of surgical and medical care, unspecified, sequela: Secondary | ICD-10-CM

## 2013-03-26 MED ORDER — DOXYCYCLINE HYCLATE 50 MG PO CAPS: 100.0000 mg | ORAL_CAPSULE | Freq: Two times a day (BID) | ORAL | Status: DC

## 2013-03-26 MED ORDER — OXYCODONE-ACETAMINOPHEN 7.5-325 MG PO TABS: 2.0000 | ORAL_TABLET | ORAL | Status: DC | PRN

## 2013-03-26 NOTE — Telephone Encounter (Signed)
Patient called in requesting pain medicine. Okay per [place]. Up front for patient to pick up.

## 2013-03-26 NOTE — Patient Instructions (Signed)
End Colostomy Reversal An end colostomy reversal is surgery that reverses an end colostomy. The large intestine is disconnected from the opening in the abdomen (stoma). It is then reconnected to the large intestine inside the body. A stoma and pouch are no longer needed. Bowel movements can resume through the rectum. LET YOUR CAREGIVER KNOW ABOUT:  Allergies to food or medicine.  Medicines taken, including vitamins, health supplements, herbs, eyedrops, over-the-counter medicines, and creams.  Use of steroids (by mouth or creams).  Previous problems with anesthetics or numbing medicines.  History of bleeding problems or blood clots.  Previous surgery.  Other health problems, including diabetes and kidney problems.  Possibility of pregnancy, if this applies. RISKS AND COMPLICATIONS General surgical complications may include:  Reaction to anesthesia.  Damage to surrounding nerves, tissues, or structures.  Blood clot.  Bleeding.  Scarring. Specific risks for colostomy reversal, while rare, may include:  Intestinal paralysis (ileus). This is a normal part of recovery. It usually goes away in 3 to 7 days. However, it can last longer in some people.  Leaking at the joined part of the intestine (anastamotic leak).  Infection of the surgical cut (incision) or the place where the stoma was located.  A collection of pus (abscess) in the abdomen or pelvis.  Intestinal blockage.  Narrowing at the joined part of the intestine (stricture).  Urinary and sexual dysfunction. BEFORE THE PROCEDURE It is important to follow your surgeon's instructions prior to your procedure. This will help you to avoid complications. Steps before your procedure may include:  A physical exam, rectal exam, X-rays, colonoscopy, and other procedures.  Chemotherapy or radiation therapy if the stoma was created for cancer.  A review of the procedure, the anesthesia being used, and what to expect after the  procedure. You may be asked to:  Stop taking certain medicines for several days prior to your procedure. This may include blood thinners (such as aspirin).  Take certain medicines, such as antibiotics or stool softeners.  Avoid eating and drinking after midnight the night before the procedure. This will help you to avoid complications from the anesthesia.  Quit smoking. Smoking increases the chances of a healing problem after your procedure. PROCEDURE  You will be given medicine that makes you sleep (general anesthetic). The procedure may be done as open surgery, with a large incision. It may also be done as laparoscopic surgery, with several smaller incisions. The surgeon will stitch or staple the intestine ends back together. This surgery takes several hours. AFTER THE PROCEDURE  You will be given pain medicine.  Your caregivers will slowly increase your diet and movement.  You can expect to be in the hospital for about 5 to 10 days.  You should arrange for someone to help you with activities at home while you recover. Document Released: 11/29/2011 Document Reviewed: 11/29/2011 [employer] Patient Information 2014 [employer].  

## 2013-03-26 NOTE — Progress Notes (Signed)
Subjective:     Patient ID: [name], female   DOB: 1961/09/07, 52 y.o.   MRN: [id number]  HPI Patient returns to clinic. CT was done which shows no explanation for her right lower quadrant pain. She has small subcutaneous fluid collection drained last week and cultures were negative. She still has pain around the site. Review of Systems  Constitutional: Positive for fatigue.  Gastrointestinal: Positive for abdominal pain.  Musculoskeletal: Negative.        Objective:   Physical Exam  Constitutional: She is oriented to person, place, and time.  HENT:  Head: Normocephalic.  Neck: Normal range of motion.  Abdominal:    Neurological: She is alert and oriented to person, place, and time.  Skin: Skin is warm and dry.   Right lower quadrant wound is open and clean. No erythema. 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 Sagittal and coronal MPR  images reconstructed from axial data set.  Contrast: OMNIPAQUE IOHEXOL 300 MG/ML SOLN Dilute oral  contrast.  Comparison: None  Findings:  Lung bases clear.  Gallbladder surgically absent.  Liver, spleen, pancreas, kidneys, and adrenal glands normal  appearance.  Left lower quadrant colostomy with mild nonspecific infiltrative  changes of adjacent subcutaneous fat.  Small left periumbilical hernia containing fat.  Stomach and bowel loops grossly normal appearance.  Unremarkable uterus, adnexae, and decompressed bladder.  No mass, adenopathy, or free fluid.  Small periumbilical ventral wound on the right with minimal   subcutaneous fat infiltration and tiny foci of gas.  Ventral abdominal scar.  No acute osseous findings.  IMPRESSION:  Left lower quadrant colostomy with mild nonspecific peristomal  infiltrative changes of subcutaneous fat.  Tiny right periumbilical open wound with minimal subcutaneous  infiltration and gas.  Periumbilical ventral hernia on left containing fat.  No acute intra-abdominal or intrapelvic abnormalities Findings: A preliminary film of the abdomen shows a nonspecific  bowel gas pattern. Surgical clips are present in the right upper  quadrant from prior cholecystectomy.  Initially a barium enema via the rectum was performed. The small  portion of the remaining rectosigmoid colon is well visualized with  a few residual diverticula noted. Postevacuation film shows no  significant abnormality.  A barium enema was then performed via the ostomy. Distention is  not optimal, but there are scattered diverticula within the  transverse and right colon. There is considerable spasm of the  right colon but no persistent abnormality is evident on the images  obtained.  IMPRESSION:  1. Negative barium enema via the rectum with only a few  rectosigmoid diverticula remaining.  2. Suboptimal distention of the ascending colon, with diverticula  remaining throughout the transverse and hepatic flexure of colon.  Considerable spasm is noted within the right colon.       Assessment:     3 cm subcutaneous abdominal abscess cultures negative Colostomy in place Chronic pain COPD fibromyalgia    Plan:     .her wound looks better today. Her CT shows no the cause of her right lower pain. We'll go ahead and refill her doxycycline. We'll schedule for colostomy closure early next month.  Discussed risk of bleeding, infection, wound complications, hernia recurrence, anastomotic leak, colostomy formation organ injury, injury to blood vessels, ureters, nerves, chronic abdominal pain, and the need  for other operations. She is ready to proceed.  She has significant sigmoid colon remaining and this will need  to be removed the same time.

## 2013-03-26 NOTE — Telephone Encounter (Signed)
Patient called back after her appt to ask for pain medication. Dr Luisa Hart writing RX for percocet and will be at the front for patient pick up. Patient aware.

## 2013-03-30 ENCOUNTER — Encounter (INDEPENDENT_AMBULATORY_CARE_PROVIDER_SITE_OTHER): Payer: 59 | Admitting: Surgery

## 2013-04-09 ENCOUNTER — Other Ambulatory Visit (INDEPENDENT_AMBULATORY_CARE_PROVIDER_SITE_OTHER): Payer: Self-pay | Admitting: Surgery

## 2013-04-09 NOTE — Telephone Encounter (Deleted)
Refill request of doxy. Does she still need to take it? 

## 2013-04-09 NOTE — Telephone Encounter (Signed)
Refill request of doxy. Does she still need to take it?

## 2013-04-23 ENCOUNTER — Encounter (HOSPITAL_COMMUNITY): Payer: Self-pay | Admitting: Pharmacy Technician

## 2013-04-25 ENCOUNTER — Inpatient Hospital Stay (HOSPITAL_COMMUNITY): Admission: RE | Admit: 2013-04-25 | Payer: 59 | Source: Ambulatory Visit

## 2013-05-01 ENCOUNTER — Encounter (HOSPITAL_COMMUNITY)
Admission: RE | Admit: 2013-05-01 | Discharge: 2013-05-01 | Disposition: A | Discharge status: Home / Self Care | Payer: 59 | Source: Ambulatory Visit | Attending: Surgery | Admitting: Surgery

## 2013-05-01 ENCOUNTER — Encounter (HOSPITAL_COMMUNITY): Payer: Self-pay

## 2013-05-01 ENCOUNTER — Encounter (HOSPITAL_COMMUNITY)
Admission: RE | Admit: 2013-05-01 | Discharge: 2013-05-01 | Disposition: A | Discharge status: Home / Self Care | Payer: 59 | Source: Ambulatory Visit | Attending: Anesthesiology | Admitting: Anesthesiology

## 2013-05-01 DIAGNOSIS — Z01818 Encounter for other preprocedural examination: Secondary | ICD-10-CM | POA: Insufficient documentation

## 2013-05-01 DIAGNOSIS — Z01812 Encounter for preprocedural laboratory examination: Secondary | ICD-10-CM | POA: Insufficient documentation

## 2013-05-01 HISTORY — DX: Type 2 diabetes mellitus without complications: E11.9

## 2013-05-01 LAB — COMPREHENSIVE METABOLIC PANEL
ALT: 7 U/L (ref 0–35)
AST: 19 U/L (ref 0–37)
Albumin: 3.6 g/dL (ref 3.5–5.2)
Alkaline Phosphatase: 85 U/L (ref 39–117)
BUN: 16 mg/dL (ref 6–23)
CO2: 24 mEq/L (ref 19–32)
Calcium: 9.5 mg/dL (ref 8.4–10.5)
Chloride: 104 mEq/L (ref 96–112)
Creatinine, Ser: 0.72 mg/dL (ref 0.50–1.10)
GFR calc Af Amer: >90 mL/min (ref >90)
GFR calc non Af Amer: >90 mL/min (ref >90)
Glucose, Bld: 140 mg/dL — ABNORMAL HIGH (ref 70–99)
Potassium: 4.2 mEq/L (ref 3.5–5.1)
Sodium: 138 mEq/L (ref 135–145)
Total Bilirubin: 0.3 mg/dL (ref 0.3–1.2)
Total Protein: 7.4 g/dL (ref 6.0–8.3)

## 2013-05-01 LAB — CBC WITH DIFFERENTIAL/PLATELET
Basophils Absolute: 0 10*3/uL (ref 0.0–0.1)
Basophils Relative: 0 % (ref 0–1)
Eosinophils Absolute: 0.1 10*3/uL (ref 0.0–0.7)
Eosinophils Relative: 2 % (ref 0–5)
HCT: 42.7 % (ref 36.0–46.0)
Hemoglobin: 15.2 g/dL — ABNORMAL HIGH (ref 12.0–15.0)
Lymphocytes Relative: 34 % (ref 12–46)
Lymphs Abs: 2.6 10*3/uL (ref 0.7–4.0)
MCH: 30.1 pg (ref 26.0–34.0)
MCHC: 35.6 g/dL (ref 30.0–36.0)
MCV: 84.6 fL (ref 78.0–100.0)
Monocytes Absolute: 0.6 10*3/uL (ref 0.1–1.0)
Monocytes Relative: 8 % (ref 3–12)
Neutro Abs: 4.3 10*3/uL (ref 1.7–7.7)
Neutrophils Relative %: 56 % (ref 43–77)
Platelets: 233 10*3/uL (ref 150–400)
RBC: 5.05 MIL/uL (ref 3.87–5.11)
RDW: 12.5 % (ref 11.5–15.5)
WBC: 7.7 10*3/uL (ref 4.0–10.5)

## 2013-05-01 NOTE — Pre-Procedure Instructions (Signed)
[name]  05/01/2013   Your procedure is scheduled on:  May 03, 2013 at 11:00 AM  Report to [facility] at 9:00 AM.  Call this number if you have problems the morning of surgery: [phone]   Remember:   Do not eat food or drink liquids after midnight.   Take these medicines the morning of surgery with A SIP OF WATER: diazepam (VALIUM), and esomeprazole (NEXIUM)      Do not wear jewelry, make-up or nail polish.  Do not wear lotions, powders, or perfumes. You may wear deodorant.  Do not shave 48 hours prior to surgery.  Do not bring valuables to the hospital.  [facility] is not responsible  for any belongings or valuables.  Contacts, dentures or bridgework may not be worn into surgery.  Leave suitcase in the car. After surgery it may be brought to your room.  For patients admitted to the hospital, checkout time is 11:00 AM the day of discharge.    Special Instructions: Shower using CHG 2 nights before surgery and the night before surgery.  If you shower the day of surgery use CHG.  Use special wash - you have one bottle of CHG for all showers.  You should use approximately 1/3 of the bottle for each shower.   Please read over the following fact sheets that you were given: Pain Booklet, Coughing and Deep Breathing and Surgical Site Infection Prevention

## 2013-05-02 MED ORDER — ALVIMOPAN 12 MG PO CAPS
12.0000 mg | ORAL_CAPSULE | Freq: Once | ORAL | Status: AC
  Administered 2013-05-03: 12 mg via ORAL
  Filled 2013-05-02 (×2): qty 1

## 2013-05-02 MED ORDER — DEXTROSE 5 % IV SOLN
2.0000 g | INTRAVENOUS | Status: AC
  Administered 2013-05-03: 2 g via INTRAVENOUS
  Filled 2013-05-02 (×2): qty 2

## 2013-05-03 ENCOUNTER — Inpatient Hospital Stay (HOSPITAL_COMMUNITY)
Admission: RE | Admit: 2013-05-03 | Discharge: 2013-05-09 | DRG: 330 | Disposition: A | Discharge status: Home Health | Payer: 59 | Source: Ambulatory Visit | Attending: Surgery | Admitting: Surgery

## 2013-05-03 ENCOUNTER — Encounter (HOSPITAL_COMMUNITY)
Admission: RE | Disposition: A | Discharge status: Home Health | Payer: Self-pay | Source: Ambulatory Visit | Attending: Surgery

## 2013-05-03 ENCOUNTER — Inpatient Hospital Stay (HOSPITAL_COMMUNITY): Payer: 59 | Admitting: Certified Registered Nurse Anesthetist

## 2013-05-03 ENCOUNTER — Encounter (HOSPITAL_COMMUNITY): Payer: Self-pay | Admitting: Certified Registered Nurse Anesthetist

## 2013-05-03 DIAGNOSIS — Z433 Encounter for attention to colostomy: Secondary | ICD-10-CM

## 2013-05-03 DIAGNOSIS — J9811 Atelectasis: Secondary | ICD-10-CM

## 2013-05-03 DIAGNOSIS — J4489 Other specified chronic obstructive pulmonary disease: Secondary | ICD-10-CM | POA: Diagnosis present

## 2013-05-03 DIAGNOSIS — G8929 Other chronic pain: Secondary | ICD-10-CM | POA: Diagnosis present

## 2013-05-03 DIAGNOSIS — E876 Hypokalemia: Secondary | ICD-10-CM

## 2013-05-03 DIAGNOSIS — R739 Hyperglycemia, unspecified: Secondary | ICD-10-CM

## 2013-05-03 DIAGNOSIS — K660 Peritoneal adhesions (postprocedural) (postinfection): Secondary | ICD-10-CM

## 2013-05-03 DIAGNOSIS — Z01811 Encounter for preprocedural respiratory examination: Secondary | ICD-10-CM

## 2013-05-03 DIAGNOSIS — J189 Pneumonia, unspecified organism: Secondary | ICD-10-CM

## 2013-05-03 DIAGNOSIS — D72829 Elevated white blood cell count, unspecified: Secondary | ICD-10-CM

## 2013-05-03 DIAGNOSIS — J45901 Unspecified asthma with (acute) exacerbation: Secondary | ICD-10-CM

## 2013-05-03 DIAGNOSIS — K435 Parastomal hernia without obstruction or  gangrene: Secondary | ICD-10-CM

## 2013-05-03 DIAGNOSIS — F17213 Nicotine dependence, cigarettes, with withdrawal: Secondary | ICD-10-CM

## 2013-05-03 DIAGNOSIS — Y95 Nosocomial condition: Secondary | ICD-10-CM

## 2013-05-03 DIAGNOSIS — IMO0001 Reserved for inherently not codable concepts without codable children: Secondary | ICD-10-CM

## 2013-05-03 DIAGNOSIS — F411 Generalized anxiety disorder: Secondary | ICD-10-CM

## 2013-05-03 DIAGNOSIS — F17200 Nicotine dependence, unspecified, uncomplicated: Secondary | ICD-10-CM

## 2013-05-03 DIAGNOSIS — G2581 Restless legs syndrome: Secondary | ICD-10-CM

## 2013-05-03 DIAGNOSIS — J441 Chronic obstructive pulmonary disease with (acute) exacerbation: Secondary | ICD-10-CM

## 2013-05-03 DIAGNOSIS — N179 Acute kidney failure, unspecified: Secondary | ICD-10-CM

## 2013-05-03 DIAGNOSIS — Z9889 Other specified postprocedural states: Secondary | ICD-10-CM

## 2013-05-03 DIAGNOSIS — R195 Other fecal abnormalities: Secondary | ICD-10-CM

## 2013-05-03 DIAGNOSIS — E669 Obesity, unspecified: Secondary | ICD-10-CM

## 2013-05-03 DIAGNOSIS — E119 Type 2 diabetes mellitus without complications: Secondary | ICD-10-CM | POA: Diagnosis present

## 2013-05-03 DIAGNOSIS — J449 Chronic obstructive pulmonary disease, unspecified: Secondary | ICD-10-CM

## 2013-05-03 DIAGNOSIS — T8131XS Disruption of external operation (surgical) wound, not elsewhere classified, sequela: Secondary | ICD-10-CM

## 2013-05-03 DIAGNOSIS — Z01812 Encounter for preprocedural laboratory examination: Secondary | ICD-10-CM

## 2013-05-03 DIAGNOSIS — J9601 Acute respiratory failure with hypoxia: Secondary | ICD-10-CM

## 2013-05-03 DIAGNOSIS — M797 Fibromyalgia: Secondary | ICD-10-CM

## 2013-05-03 DIAGNOSIS — Z6841 Body Mass Index (BMI) 40.0 and over, adult: Secondary | ICD-10-CM

## 2013-05-03 DIAGNOSIS — J810 Acute pulmonary edema: Secondary | ICD-10-CM

## 2013-05-03 DIAGNOSIS — K219 Gastro-esophageal reflux disease without esophagitis: Secondary | ICD-10-CM

## 2013-05-03 HISTORY — DX: Vomiting, unspecified: R11.10

## 2013-05-03 HISTORY — PX: COLOSTOMY CLOSURE: SHX1381

## 2013-05-03 LAB — CBC
HCT: 39.8 % (ref 36.0–46.0)
Hemoglobin: 13.5 g/dL (ref 12.0–15.0)
MCH: 29.3 pg (ref 26.0–34.0)
MCHC: 33.9 g/dL (ref 30.0–36.0)
MCV: 86.3 fL (ref 78.0–100.0)
Platelets: 234 10*3/uL (ref 150–400)
RBC: 4.61 MIL/uL (ref 3.87–5.11)
RDW: 12.7 % (ref 11.5–15.5)
WBC: 18.3 10*3/uL — ABNORMAL HIGH (ref 4.0–10.5)

## 2013-05-03 LAB — CREATININE, SERUM
Creatinine, Ser: 0.81 mg/dL (ref 0.50–1.10)
GFR calc Af Amer: >90 mL/min (ref >90)
GFR calc non Af Amer: 81 mL/min — ABNORMAL LOW (ref >90)

## 2013-05-03 LAB — GLUCOSE, CAPILLARY: Glucose-Capillary: 139 mg/dL — ABNORMAL HIGH (ref 70–99)

## 2013-05-03 SURGERY — COLOSTOMY CLOSURE
Anesthesia: General | Wound class: Contaminated

## 2013-05-03 MED ORDER — LACTATED RINGERS IV SOLN: INTRAVENOUS | Status: DC

## 2013-05-03 MED ORDER — DEXTROSE 5 % IV SOLN
1.0000 g | Freq: Four times a day (QID) | INTRAVENOUS | Status: AC
  Administered 2013-05-03: 1 g via INTRAVENOUS
  Filled 2013-05-03: qty 1

## 2013-05-03 MED ORDER — HYDROMORPHONE HCL PF 1 MG/ML IJ SOLN: 0.2500 mg | INTRAMUSCULAR | Status: DC | PRN

## 2013-05-03 MED ORDER — ONDANSETRON HCL 4 MG/2ML IJ SOLN
4.0000 mg | Freq: Four times a day (QID) | INTRAMUSCULAR | Status: DC | PRN
  Administered 2013-05-05 – 2013-05-07 (×2): 4 mg via INTRAVENOUS
  Filled 2013-05-03: qty 2

## 2013-05-03 MED ORDER — LACTATED RINGERS IV SOLN
INTRAVENOUS | Status: DC | PRN
  Administered 2013-05-03 (×2): via INTRAVENOUS

## 2013-05-03 MED ORDER — NEOMYCIN SULFATE 500 MG PO TABS: 1000.0000 mg | ORAL_TABLET | ORAL | Status: DC

## 2013-05-03 MED ORDER — SODIUM CHLORIDE 0.9 % IJ SOLN: 9.0000 mL | INTRAMUSCULAR | Status: DC | PRN

## 2013-05-03 MED ORDER — LABETALOL HCL 5 MG/ML IV SOLN
INTRAVENOUS | Status: AC
  Filled 2013-05-03: qty 4

## 2013-05-03 MED ORDER — ARTIFICIAL TEARS OP OINT
TOPICAL_OINTMENT | OPHTHALMIC | Status: DC | PRN
  Administered 2013-05-03: 1 via OPHTHALMIC

## 2013-05-03 MED ORDER — OXYCODONE HCL 5 MG/5ML PO SOLN: 5.0000 mg | Freq: Once | ORAL | Status: DC | PRN

## 2013-05-03 MED ORDER — PROPOFOL 10 MG/ML IV BOLUS
INTRAVENOUS | Status: DC | PRN
  Administered 2013-05-03: 200 mg via INTRAVENOUS

## 2013-05-03 MED ORDER — GLYCOPYRROLATE 0.2 MG/ML IJ SOLN
INTRAMUSCULAR | Status: DC | PRN
  Administered 2013-05-03: .8 mg via INTRAVENOUS

## 2013-05-03 MED ORDER — ONDANSETRON HCL 4 MG/2ML IJ SOLN
4.0000 mg | Freq: Once | INTRAMUSCULAR | Status: AC | PRN
  Administered 2013-05-03: 4 mg via INTRAVENOUS

## 2013-05-03 MED ORDER — HYDROMORPHONE HCL PF 1 MG/ML IJ SOLN
INTRAMUSCULAR | Status: AC
  Filled 2013-05-03: qty 1

## 2013-05-03 MED ORDER — ERYTHROMYCIN BASE 250 MG PO TABS: 1000.0000 mg | ORAL_TABLET | ORAL | Status: DC

## 2013-05-03 MED ORDER — 0.9 % SODIUM CHLORIDE (POUR BTL) OPTIME
TOPICAL | Status: DC | PRN
  Administered 2013-05-03: 2000 mL
  Administered 2013-05-03: 1000 mL
  Administered 2013-05-03: 2000 mL

## 2013-05-03 MED ORDER — OXYCODONE HCL 5 MG PO TABS: 5.0000 mg | ORAL_TABLET | Freq: Once | ORAL | Status: DC | PRN

## 2013-05-03 MED ORDER — SUCCINYLCHOLINE CHLORIDE 20 MG/ML IJ SOLN
INTRAMUSCULAR | Status: DC | PRN
  Administered 2013-05-03: 60 mg via INTRAVENOUS

## 2013-05-03 MED ORDER — HYDROMORPHONE HCL PF 1 MG/ML IJ SOLN
0.2500 mg | INTRAMUSCULAR | Status: DC | PRN
  Administered 2013-05-03 (×2): 0.5 mg via INTRAVENOUS

## 2013-05-03 MED ORDER — ONDANSETRON HCL 4 MG/2ML IJ SOLN: 4.0000 mg | Freq: Four times a day (QID) | INTRAMUSCULAR | Status: DC | PRN

## 2013-05-03 MED ORDER — ONDANSETRON HCL 4 MG PO TABS
4.0000 mg | ORAL_TABLET | Freq: Four times a day (QID) | ORAL | Status: DC | PRN
  Administered 2013-05-07: 4 mg via ORAL
  Filled 2013-05-03: qty 1

## 2013-05-03 MED ORDER — NEOSTIGMINE METHYLSULFATE 1 MG/ML IJ SOLN
INTRAMUSCULAR | Status: DC | PRN
  Administered 2013-05-03: 5 mg via INTRAVENOUS

## 2013-05-03 MED ORDER — DIPHENHYDRAMINE HCL 12.5 MG/5ML PO ELIX: 12.5000 mg | ORAL_SOLUTION | Freq: Four times a day (QID) | ORAL | Status: DC | PRN

## 2013-05-03 MED ORDER — ENOXAPARIN SODIUM 40 MG/0.4ML ~~LOC~~ SOLN
40.0000 mg | SUBCUTANEOUS | Status: DC
  Administered 2013-05-04 – 2013-05-08 (×5): 40 mg via SUBCUTANEOUS
  Filled 2013-05-03 (×7): qty 0.4

## 2013-05-03 MED ORDER — ALBUMIN HUMAN 5 % IV SOLN
INTRAVENOUS | Status: DC | PRN
  Administered 2013-05-03: 13:00:00 via INTRAVENOUS

## 2013-05-03 MED ORDER — ROCURONIUM BROMIDE 100 MG/10ML IV SOLN
INTRAVENOUS | Status: DC | PRN
  Administered 2013-05-03: 10 mg via INTRAVENOUS
  Administered 2013-05-03: 50 mg via INTRAVENOUS
  Administered 2013-05-03 (×2): 10 mg via INTRAVENOUS

## 2013-05-03 MED ORDER — ONDANSETRON HCL 4 MG/2ML IJ SOLN
4.0000 mg | Freq: Four times a day (QID) | INTRAMUSCULAR | Status: DC | PRN
  Administered 2013-05-03 – 2013-05-04 (×3): 4 mg via INTRAVENOUS
  Filled 2013-05-03 (×4): qty 2

## 2013-05-03 MED ORDER — ONDANSETRON HCL 4 MG/2ML IJ SOLN
INTRAMUSCULAR | Status: AC
  Filled 2013-05-03: qty 2

## 2013-05-03 MED ORDER — HYDROMORPHONE 0.3 MG/ML IV SOLN
INTRAVENOUS | Status: AC
  Administered 2013-05-03: 16:00:00
  Filled 2013-05-03: qty 25

## 2013-05-03 MED ORDER — ONDANSETRON HCL 4 MG/2ML IJ SOLN
INTRAMUSCULAR | Status: DC | PRN
  Administered 2013-05-03: 4 mg via INTRAVENOUS

## 2013-05-03 MED ORDER — HYDROMORPHONE 0.3 MG/ML IV SOLN
INTRAVENOUS | Status: DC
  Administered 2013-05-03 (×2): 3.6 mg via INTRAVENOUS
  Administered 2013-05-04: 0.1 mg via INTRAVENOUS
  Administered 2013-05-04: 1.8 mg via INTRAVENOUS
  Administered 2013-05-04: 0.9 mg via INTRAVENOUS
  Administered 2013-05-04: 2.4 mg via INTRAVENOUS
  Administered 2013-05-04: 14:00:00 via INTRAVENOUS
  Administered 2013-05-04: 1 mg via INTRAVENOUS
  Administered 2013-05-05: 1.8 mg via INTRAVENOUS
  Administered 2013-05-05: 132 mg via INTRAVENOUS
  Administered 2013-05-05: 0.719 mg via INTRAVENOUS
  Administered 2013-05-05: 0.9 mg via INTRAVENOUS
  Administered 2013-05-05 (×2): 0.6 mg via INTRAVENOUS
  Administered 2013-05-06: 2.95 mg via INTRAVENOUS
  Administered 2013-05-06: 2.1 mg via INTRAVENOUS
  Administered 2013-05-07: 4.5 mg via INTRAVENOUS
  Administered 2013-05-07: 1.07 mg via INTRAVENOUS
  Administered 2013-05-07: 0.3 mg via INTRAVENOUS
  Administered 2013-05-08: 1.8 mg via INTRAVENOUS
  Administered 2013-05-08: 1.2 mg via INTRAVENOUS
  Filled 2013-05-03 (×5): qty 25

## 2013-05-03 MED ORDER — ACETAMINOPHEN 10 MG/ML IV SOLN
1000.0000 mg | Freq: Four times a day (QID) | INTRAVENOUS | Status: AC
  Administered 2013-05-03 – 2013-05-04 (×3): 1000 mg via INTRAVENOUS
  Filled 2013-05-03 (×4): qty 100

## 2013-05-03 MED ORDER — KCL IN DEXTROSE-NACL 20-5-0.9 MEQ/L-%-% IV SOLN
INTRAVENOUS | Status: DC
  Administered 2013-05-03 – 2013-05-05 (×3): via INTRAVENOUS
  Administered 2013-05-06 – 2013-05-07 (×2): 75 mL/h via INTRAVENOUS
  Filled 2013-05-03 (×11): qty 1000

## 2013-05-03 MED ORDER — ALVIMOPAN 12 MG PO CAPS
12.0000 mg | ORAL_CAPSULE | Freq: Two times a day (BID) | ORAL | Status: DC
  Administered 2013-05-04 – 2013-05-07 (×8): 12 mg via ORAL
  Filled 2013-05-03 (×10): qty 1

## 2013-05-03 MED ORDER — FENTANYL CITRATE 0.05 MG/ML IJ SOLN
INTRAMUSCULAR | Status: DC | PRN
  Administered 2013-05-03: 50 ug via INTRAVENOUS
  Administered 2013-05-03: 100 ug via INTRAVENOUS
  Administered 2013-05-03 (×2): 50 ug via INTRAVENOUS
  Administered 2013-05-03 (×2): 100 ug via INTRAVENOUS

## 2013-05-03 MED ORDER — MIDAZOLAM HCL 5 MG/5ML IJ SOLN
INTRAMUSCULAR | Status: DC | PRN
  Administered 2013-05-03: 2 mg via INTRAVENOUS

## 2013-05-03 MED ORDER — LIDOCAINE HCL 4 % MT SOLN
OROMUCOSAL | Status: DC | PRN
  Administered 2013-05-03: 4 mL via TOPICAL

## 2013-05-03 MED ORDER — LIDOCAINE HCL (CARDIAC) 20 MG/ML IV SOLN
INTRAVENOUS | Status: DC | PRN
  Administered 2013-05-03: 65 mg via INTRAVENOUS

## 2013-05-03 MED ORDER — DIPHENHYDRAMINE HCL 50 MG/ML IJ SOLN: 12.5000 mg | Freq: Four times a day (QID) | INTRAMUSCULAR | Status: DC | PRN

## 2013-05-03 MED ORDER — NALOXONE HCL 0.4 MG/ML IJ SOLN: 0.4000 mg | INTRAMUSCULAR | Status: DC | PRN

## 2013-05-03 SURGICAL SUPPLY — 64 items
BANDAGE GAUZE ELAST BULKY 4 IN (GAUZE/BANDAGES/DRESSINGS) ×1 IMPLANT
BLADE SURG ROTATE 9660 (MISCELLANEOUS) IMPLANT
CANISTER SUCTION 2500CC (MISCELLANEOUS) ×2 IMPLANT
CLOTH BEACON ORANGE TIMEOUT ST (SAFETY) ×2 IMPLANT
COVER MAYO STAND STRL (DRAPES) ×2 IMPLANT
COVER SURGICAL LIGHT HANDLE (MISCELLANEOUS) ×2 IMPLANT
DRAIN CHANNEL 19F RND (DRAIN) ×1 IMPLANT
DRAPE LAPAROSCOPIC ABDOMINAL (DRAPES) ×2 IMPLANT
DRAPE PROXIMA HALF (DRAPES) IMPLANT
DRAPE UTILITY 15X26 W/TAPE STR (DRAPE) ×6 IMPLANT
DRAPE WARM FLUID 44X44 (DRAPE) ×2 IMPLANT
DRSG OPSITE POSTOP 4X10 (GAUZE/BANDAGES/DRESSINGS) ×1 IMPLANT
DRSG OPSITE POSTOP 4X8 (GAUZE/BANDAGES/DRESSINGS) ×1 IMPLANT
DRSG PAD ABDOMINAL 8X10 ST (GAUZE/BANDAGES/DRESSINGS) ×1 IMPLANT
ELECT BLADE 6.5 EXT (BLADE) IMPLANT
ELECT REM PT RETURN 9FT ADLT (ELECTROSURGICAL) ×2
ELECTRODE REM PT RTRN 9FT ADLT (ELECTROSURGICAL) ×1 IMPLANT
EVACUATOR SILICONE 100CC (DRAIN) ×1 IMPLANT
GLOVE BIO SURGEON STRL SZ 6 (GLOVE) ×2 IMPLANT
GLOVE BIO SURGEON STRL SZ7.5 (GLOVE) ×3 IMPLANT
GLOVE BIO SURGEON STRL SZ8 (GLOVE) ×4 IMPLANT
GLOVE BIOGEL PI IND STRL 6.5 (GLOVE) IMPLANT
GLOVE BIOGEL PI IND STRL 7.5 (GLOVE) IMPLANT
GLOVE BIOGEL PI IND STRL 8 (GLOVE) ×2 IMPLANT
GLOVE BIOGEL PI INDICATOR 6.5 (GLOVE) ×2
GLOVE BIOGEL PI INDICATOR 7.5 (GLOVE) ×3
GLOVE BIOGEL PI INDICATOR 8 (GLOVE) ×4
GLOVE SURG SS PI 7.0 STRL IVOR (GLOVE) ×3 IMPLANT
GOWN STRL NON-REIN LRG LVL3 (GOWN DISPOSABLE) ×12 IMPLANT
GOWN STRL REIN 2XL LVL4 (GOWN DISPOSABLE) ×1 IMPLANT
GOWN STRL REIN XL XLG (GOWN DISPOSABLE) ×2 IMPLANT
KIT BASIN OR (CUSTOM PROCEDURE TRAY) ×2 IMPLANT
KIT ROOM TURNOVER OR (KITS) ×2 IMPLANT
LEGGING LITHOTOMY PAIR STRL (DRAPES) IMPLANT
NS IRRIG 1000ML POUR BTL (IV SOLUTION) ×4 IMPLANT
PACK GENERAL/GYN (CUSTOM PROCEDURE TRAY) ×2 IMPLANT
PAD ARMBOARD 7.5X6 YLW CONV (MISCELLANEOUS) ×4 IMPLANT
PAD SHARPS MAGNETIC DISPOSAL (MISCELLANEOUS) ×2 IMPLANT
SPONGE GAUZE 4X4 12PLY (GAUZE/BANDAGES/DRESSINGS) ×1 IMPLANT
SPONGE LAP 18X18 X RAY DECT (DISPOSABLE) ×4 IMPLANT
STAPLER CIRC CVD 29MM 37CM (STAPLE) ×1 IMPLANT
STAPLER CUT CVD 40MM BLUE (STAPLE) ×1 IMPLANT
STAPLER PROXIMATE 75MM BLUE (STAPLE) ×1 IMPLANT
STAPLER VISISTAT 35W (STAPLE) ×2 IMPLANT
SUCTION POOLE TIP (SUCTIONS) IMPLANT
SURGILUBE 2OZ TUBE FLIPTOP (MISCELLANEOUS) ×2 IMPLANT
SUT ETHILON 2 0 FS 18 (SUTURE) ×1 IMPLANT
SUT NOVA 1 T20/GS 25DT (SUTURE) ×1 IMPLANT
SUT NOVA NAB DX-16 0-1 5-0 T12 (SUTURE) ×2 IMPLANT
SUT PDS AB 1 TP1 96 (SUTURE) ×4 IMPLANT
SUT SILK 3 0 TIES 10X30 (SUTURE) ×2 IMPLANT
SUT SILK 3 0SH CR/8 30 (SUTURE) ×1 IMPLANT
SUT VIC AB 2-0 CTX 36 (SUTURE) ×2 IMPLANT
SUT VIC AB 2-0 SH 18 (SUTURE) ×2 IMPLANT
SUT VIC AB 3-0 SH 18 (SUTURE) ×2 IMPLANT
SUT VICRYL AB 2 0 TIES (SUTURE) ×2 IMPLANT
SUT VICRYL AB 3 0 TIES (SUTURE) ×2 IMPLANT
SYR BULB IRRIGATION 50ML (SYRINGE) ×2 IMPLANT
TAPE CLOTH SURG 6X10 WHT LF (GAUZE/BANDAGES/DRESSINGS) ×2 IMPLANT
TOWEL OR 17X26 10 PK STRL BLUE (TOWEL DISPOSABLE) ×4 IMPLANT
TRAY FOLEY CATH 14FRSI W/METER (CATHETERS) ×1 IMPLANT
TRAY PROCTOSCOPIC FIBER OPTIC (SET/KITS/TRAYS/PACK) ×1 IMPLANT
WATER STERILE IRR 1000ML POUR (IV SOLUTION) ×2 IMPLANT
YANKAUER SUCT BULB TIP NO VENT (SUCTIONS) ×2 IMPLANT

## 2013-05-03 NOTE — OR Nursing (Signed)
DR Noreene Larsson REVIEWED EKG/REQUESTS A 2ND EKG

## 2013-05-03 NOTE — Anesthesia Postprocedure Evaluation (Signed)
  Anesthesia Post-op Note  Patient: [name]  Procedure(s) Performed: Procedure(s): COLOSTOMY CLOSURE (N/A)  Patient Location: PACU  Anesthesia Type:General  Level of Consciousness: awake, alert  and oriented  Airway and Oxygen Therapy: Patient Spontanous Breathing and Patient connected to nasal cannula oxygen  Post-op Pain: mild  Post-op Assessment: Post-op Vital signs reviewed, Patient's Cardiovascular Status Stable and Respiratory Function Stable  Post-op Vital Signs: stable  Complications: No apparent anesthesia complications

## 2013-05-03 NOTE — OR Nursing (Signed)
2ND EKG REVIEWED BY DR JOSLIN/OK FOR PT TO TRANSFER TO FLOOR

## 2013-05-03 NOTE — Anesthesia Preprocedure Evaluation (Addendum)
Anesthesia Evaluation  Patient identified by MRN, date of birth, ID band Patient awake    Reviewed: Allergy & Precautions, H&P , NPO status , Patient's Chart, lab work & pertinent test results, reviewed documented beta blocker date and time   Airway Mallampati: II  Neck ROM: full    Dental  (+) Poor Dentition, Loose, Missing and Dental Advisory Given   Pulmonary asthma , COPDformer smoker,          Cardiovascular     Neuro/Psych  Headaches, Anxiety  Neuromuscular disease    GI/Hepatic GERD-  ,  Endo/Other  diabetes, Type obesity  Renal/GU      Musculoskeletal  (+) Fibromyalgia -  Abdominal   Peds  Hematology   Anesthesia Other Findings   Reproductive/Obstetrics                         Anesthesia Physical Anesthesia Plan  ASA: III  Anesthesia Plan: General   Post-op Pain Management:    Induction: Intravenous  Airway Management Planned: Oral ETT  Additional Equipment:   Intra-op Plan:   Post-operative Plan: Extubation in OR  Informed Consent: I have reviewed the patients History and Physical, chart, labs and discussed the procedure including the risks, benefits and alternatives for the proposed anesthesia with the patient or authorized representative who has indicated his/her understanding and acceptance.     Plan Discussed with: CRNA, Anesthesiologist and Surgeon  Anesthesia Plan Comments:         Anesthesia Quick Evaluation

## 2013-05-03 NOTE — Interval H&P Note (Signed)
History and Physical Interval Note:  05/03/2013 10:57 AM  [name]  has presented today for surgery, with the diagnosis of colostomy  The various methods of treatment have been discussed with the patient and family. After consideration of risks, benefits and other options for treatment, the patient has consented to  Procedure(s): COLOSTOMY CLOSURE (N/A) as a surgical intervention .  The patient's history has been reviewed, patient examined, no change in status, stable for surgery.  I have reviewed the patient's chart and labs.  Questions were answered to the patient's satisfaction.     Eldra Word A.

## 2013-05-03 NOTE — H&P (Signed)
Demographics [name] 52 year old female  Comm Pref: None [address]  [place] [address] [phone] (H)   Problem ListUnprioritized  Acute respiratory failure with hypoxia  Acute exacerbation of COPD with asthma  Parastomal hernia  Fibromyalgia  Generalized anxiety disorder  Restless legs syndrome  Obesity (BMI 35.0-39.9 without comorbidity)  Leukocytosis, unspecified  Hyperglycemia  Cigarette nicotine dependence with withdrawal  Complete atelectasis  HAP (hospital-acquired pneumonia)  Acute pulmonary edema  Acute renal failure  Post-operative state  COPD (chronic obstructive pulmonary disease)  Acid reflux disease  Heme positive stool  Hypokalemia  Preoperative respiratory examination  Smoking  Wound disruption, post-op, skin  Significant History/Details  Smoking: Former Smoker (Quit Date:10/31/2012), 2 ppd, 60 pack-years  Smokeless Tobacco: Never Used  Alcohol: No  1 open order  Preferred Language: English   Date of Birth1962-01-11Specialty CommentsEditShow AllReport3/4/14 PT SIGNED PHI FOR SPOUSE, [name], DOB [birth year]-BBK miralax with abx prep given to pt 03/26/13 DOS 05/03/13 TC/FB-[facility]-AMA-Colostomy closure/de 03/26/13 03/26/2013 patient scheduled for in-patient surgery 05/03/2013 @ [facility] authorization # [id number] per on-line. (chm)    MedicationsLong-TermOutpatient Medications Hospital Medications    amitriptyline (ELAVIL) 100 MG tablet    diazepam (VALIUM) 10 MG tablet   esomeprazole (NEXIUM) 40 MG capsule     Relevant Labs (3 years)  Na K Cl C02 WBC Hgb Hct Plts  05/01/13 1413 -- -- -- -- 7.7 15.2 42.7 233  05/01/13 1413 138 4.2 104 -- -- -- -- --  11/28/12 0427 -- -- -- -- 6.0 11.3 34.9 216  11/28/12 0427 143 3.7 109 -- -- -- -- --  11/27/12 0430 -- -- -- -- 7.7 12.4 38.6 252                     Relevant Encounters (Maximum of 5 visits)Date Type Department Provider Description  05/03/2013 [facility]  OPERATING ROOM Dortha Schwalbe., MD   03/26/2013 [facility], [place], [place]   03/26/2013 Office Visit [facility] Rokhaya Quinn A., MD Recurrent Abdominal Wound Abscess, Sequela (Primary Dx); Col...  03/22/2013 Telephone [facility], [facility] [place], [place]   03/19/2013 Office Visit [facility] [facility] [place] Harriette Bouillon A., MD Cellulitis and Abscess of Trunk (Primary Dx)          My Last Outpatient Progress NoteStatus Last Edited Encounter Date  Signed Mon Mar 26, 2013 12:32 PM EDT 03/26/2013  Subjective:      Patient ID: [name], female   DOB: Mar 29, 1961, 52 y.o.   MRN: [id number]   HPI Patient returns to clinic. CT was done which shows no explanation for her right lower quadrant pain. She has small subcutaneous fluid collection drained last week and cultures were negative. She still has pain around the site. Review of Systems  Constitutional: Positive for fatigue.  Gastrointestinal: Positive for abdominal pain.  Musculoskeletal: Negative.         Objective:    Physical Exam  Constitutional: She is oriented to person, place, and time.  HENT:   Head: Normocephalic.  Neck: Normal range of motion.  Abdominal:    Neurological: She is alert and oriented to person, place, and time.  Skin: Skin is warm and dry.   Right lower quadrant wound is open and clean. No erythema. Clinical Data: Abdominal pain, nausea, history kidney stones, had a   small right lower quadrant abscess glands on 03/19/2013, small   amount of drainage, on  antibiotics, history colostomy 2008 for   diverticular disease, small bowel obstruction, COPD, GERD   CT ABDOMEN AND PELVIS WITH CONTRAST   Technique: Multidetector CT imaging of the abdomen and pelvis was   performed following the standard protocol during bolus   administration of intravenous contrast. Sagittal and coronal MPR   images reconstructed from axial data set.    Contrast: OMNIPAQUE IOHEXOL 300 MG/ML SOLN Dilute oral   contrast.   Comparison: None   Findings:   Lung bases clear.   Gallbladder surgically absent.   Liver, spleen, pancreas, kidneys, and adrenal glands normal   appearance.   Left lower quadrant colostomy with mild nonspecific infiltrative   changes of adjacent subcutaneous fat.   Small left periumbilical hernia containing fat.   Stomach and bowel loops grossly normal appearance.   Unremarkable uterus, adnexae, and decompressed bladder.   No mass, adenopathy, or free fluid.   Small periumbilical ventral wound on the right with minimal   subcutaneous fat infiltration and tiny foci of gas.   Ventral abdominal scar.   No acute osseous findings.   IMPRESSION:   Left lower quadrant colostomy with mild nonspecific peristomal   infiltrative changes of subcutaneous fat.   Tiny right periumbilical open wound with minimal subcutaneous   infiltration and gas.   Periumbilical ventral hernia on left containing fat.   No acute intra-abdominal or intrapelvic abnormalities Findings: A preliminary film of the abdomen shows a nonspecific   bowel gas pattern. Surgical clips are present in the right upper   quadrant from prior cholecystectomy.   Initially a barium enema via the rectum was performed. The small   portion of the remaining rectosigmoid colon is well visualized with   a few residual diverticula noted. Postevacuation film shows no   significant abnormality.   A barium enema was then performed via the ostomy. Distention is   not optimal, but there are scattered diverticula within the   transverse and right colon. There is considerable spasm of the   right colon but no persistent abnormality is evident on the images   obtained.   IMPRESSION:   1. Negative barium enema via the rectum with only a few   rectosigmoid diverticula remaining.   2. Suboptimal distention of the ascending colon, with diverticula   remaining throughout  the transverse and hepatic flexure of colon.   Considerable spasm is noted within the right colon.        Assessment:    3 cm subcutaneous abdominal abscess cultures negative Colostomy in place Chronic pain COPD fibromyalgia   Plan:    .her wound looks better today. Her CT shows no the cause of her right lower pain. We'll go ahead and refill her doxycycline. We'll schedule for colostomy closure early next month. Discussed risk of bleeding, infection, wound complications, hernia recurrence, anastomotic leak, colostomy formation organ injury, injury to blood vessels, ureters, nerves, chronic abdominal pain, and the need for other operations. She is ready to proceed.  She has significant sigmoid colon remaining and this will need  to be removed the same time.

## 2013-05-03 NOTE — OR Nursing (Signed)
l hand PIV infiltrated/ about 6cms diameter / removed catheter/elevated same/ warm compress placed/ CRNA placed another PIV  r thumb and documented same

## 2013-05-03 NOTE — Preoperative (Signed)
Beta Blockers   Reason not to administer Beta Blockers:Not Applicable 

## 2013-05-03 NOTE — OR Nursing (Signed)
Dr Noreene Larsson notified of ekg of sinus tach, stable mentation and bp / states will see in a few minutes

## 2013-05-03 NOTE — Brief Op Note (Signed)
05/03/2013  2:56 PM  PATIENT:  [name]  52 y.o. female  PRE-OPERATIVE DIAGNOSIS:  colostomy  POST-OPERATIVE DIAGNOSIS:  Colostomy  PROCEDURE:  Procedure(s): COLOSTOMY CLOSURE (N/A)  SURGEON:  Surgeon(s) and Role:    * Calen Posch A. Somara Frymire, MD - Primary    * Almond Lint, MD - Assisting     ANESTHESIA:   general  EBL:  Total I/O In: 1250 [I.V.:1000; IV Piggyback:250] Out: 350 [Urine:150; Blood:200]  BLOOD ADMINISTERED:none  DRAINS: (19) Jackson-Pratt drain(s) with closed bulb suction in the pelvis   LOCAL MEDICATIONS USED:  NONE  SPECIMEN:  Source of Specimen:  sigmoid colon  DISPOSITION OF SPECIMEN:  PATHOLOGY  COUNTS:  YES  TOURNIQUET:  * No tourniquets in log *  DICTATION: .Other Dictation: Dictation Number  [id number]  PLAN OF CARE: Admit to inpatient   PATIENT DISPOSITION:  PACU - hemodynamically stable.   Delay start of Pharmacological VTE agent (>24hrs) due to surgical blood loss or risk of bleeding: no

## 2013-05-03 NOTE — Transfer of Care (Signed)
Immediate Anesthesia Transfer of Care Note  Patient: [name]  Procedure(s) Performed: Procedure(s): COLOSTOMY CLOSURE (N/A)  Patient Location: PACU  Anesthesia Type:General  Level of Consciousness: sedated and patient cooperative  Airway & Oxygen Therapy: Patient Spontanous Breathing and Patient connected to face mask oxygen  Post-op Assessment: Report given to PACU RN and Post -op Vital signs reviewed and stable  Post vital signs: Reviewed and stable  Complications: No apparent anesthesia complications

## 2013-05-03 NOTE — OR Nursing (Signed)
Dr singer notified of sustained hr 130's, appears sinus/ pt stable, asymptomatic/ will assess after ekg and call surgeon

## 2013-05-04 ENCOUNTER — Encounter (HOSPITAL_COMMUNITY): Payer: Self-pay | Admitting: Surgery

## 2013-05-04 LAB — BASIC METABOLIC PANEL
BUN: 20 mg/dL (ref 6–23)
CO2: 22 mEq/L (ref 19–32)
Calcium: 8.1 mg/dL — ABNORMAL LOW (ref 8.4–10.5)
Chloride: 106 mEq/L (ref 96–112)
Creatinine, Ser: 0.87 mg/dL (ref 0.50–1.10)
GFR calc Af Amer: 87 mL/min — ABNORMAL LOW (ref >90)
GFR calc non Af Amer: 75 mL/min — ABNORMAL LOW (ref >90)
Glucose, Bld: 272 mg/dL — ABNORMAL HIGH (ref 70–99)
Potassium: 5.1 mEq/L (ref 3.5–5.1)
Sodium: 139 mEq/L (ref 135–145)

## 2013-05-04 LAB — CBC
HCT: 38.7 % (ref 36.0–46.0)
Hemoglobin: 12.7 g/dL (ref 12.0–15.0)
MCH: 28.7 pg (ref 26.0–34.0)
MCHC: 32.8 g/dL (ref 30.0–36.0)
MCV: 87.6 fL (ref 78.0–100.0)
Platelets: 228 10*3/uL (ref 150–400)
RBC: 4.42 MIL/uL (ref 3.87–5.11)
RDW: 12.9 % (ref 11.5–15.5)
WBC: 17.1 10*3/uL — ABNORMAL HIGH (ref 4.0–10.5)

## 2013-05-04 MED ORDER — OXYCODONE HCL 5 MG PO TABS
10.0000 mg | ORAL_TABLET | ORAL | Status: DC | PRN
  Administered 2013-05-04 – 2013-05-09 (×6): 10 mg via ORAL
  Filled 2013-05-04 (×6): qty 2

## 2013-05-04 MED ORDER — ALUM & MAG HYDROXIDE-SIMETH 200-200-20 MG/5ML PO SUSP
30.0000 mL | Freq: Every day | ORAL | Status: DC | PRN
  Administered 2013-05-04: 30 mL via ORAL
  Filled 2013-05-04: qty 30

## 2013-05-04 MED ORDER — METOCLOPRAMIDE HCL 5 MG/ML IJ SOLN
10.0000 mg | Freq: Four times a day (QID) | INTRAMUSCULAR | Status: DC | PRN
  Administered 2013-05-04 – 2013-05-07 (×4): 10 mg via INTRAVENOUS
  Filled 2013-05-04 (×4): qty 2

## 2013-05-04 NOTE — Progress Notes (Signed)
UR COMPLETED  

## 2013-05-04 NOTE — Progress Notes (Signed)
[facility]/Sherrick Araki Progress Note 1 Day Post-Op  Subjective: Patient has had bad luck with IVs.  Nurse in the room now trying to start another IV.  (4ht in last 24 hours.)  May need a PICC.  Objective: Vital signs in last 24 hours: Temp:  [97.6 F (36.4 C)-98.8 F (37.1 C)] 98 F (36.7 C) (08/15 0524) Pulse Rate:  [1-137] 91 (08/15 0524) Resp:  [12-23] 12 (08/15 0943) BP: (111-151)/(58-88) 127/75 mmHg (08/15 0524) SpO2:  [93 %-99 %] 93 % (08/15 0943) Last BM Date: 05/03/13  Intake/Output from previous day: 08/14 0701 - 08/15 0700 In: 3050 [I.V.:2800; IV Piggyback:250] Out: 1315 [Urine:925; Drains:190; Blood:200] Intake/Output this shift:    General: Has NGT in place which has not been on suction.  Will remove  Lungs: Clear  Abd: Good bowel sounds.  Extremities: No clinical signs of DVT  Neuro: Intact  Lab Results:  @LABLAST2 (wbc:2,hgb:2,hct:2,plt:2) BMET  Recent Labs  05/01/13 1413 05/03/13 2000 05/04/13 0505  NA 138  --  139  K 4.2  --  5.1  CL 104  --  106  CO2 24  --  22  GLUCOSE 140*  --  272*  BUN 16  --  20  CREATININE 0.72 0.81 0.87  CALCIUM 9.5  --  8.1*   PT/INR No results found for this basename: LABPROT, INR,  in the last 72 hours ABG No results found for this basename: PHART, PCO2, PO2, HCO3,  in the last 72 hours  Studies/Results: No results found.  Anti-infectives: Anti-infectives   Start     Dose/Rate Route Frequency Ordered Stop   05/03/13 1930  cefOXitin (MEFOXIN) 1 g in dextrose 5 % 50 mL IVPB     1 g 100 mL/hr over 30 Minutes Intravenous 4 times per day 05/03/13 1920 05/03/13 2006   05/03/13 0852  neomycin (MYCIFRADIN) tablet 1,000 mg  Status:  Discontinued     1,000 mg Oral 3 times per day 05/03/13 0852 05/03/13 1850   05/03/13 0852  erythromycin (E-MYCIN) tablet 1,000 mg  Status:  Discontinued     1,000 mg Oral 3 times per day 05/03/13 0852 05/03/13 1850   05/03/13 0600  cefoTEtan (CEFOTAN) 2 g in dextrose 5 % 50 mL IVPB     2 g 100  mL/hr over 30 Minutes Intravenous On call to O.R. 05/02/13 1420 05/03/13 1138      Assessment/Plan: s/p Procedure(s): COLOSTOMY CLOSURE d/c foley Advance diet PICC line. Since patient has good bowel sounds and in on Entereg, I will start clear liquids.  But she will need a PICC line for her PCA at this point.  LOS: 1 day   Marta Lamas. Gae Bon, MD, FACS [phone] [facility] Surgery 05/04/2013

## 2013-05-04 NOTE — Progress Notes (Signed)
Nutrition Brief Note  Patient identified on the Malnutrition Screening Tool (MST) Report  Pt reports that she had weight at the beginning of the year prior to her surgery but none recently. Pt with good appetite PTA.   Wt Readings from Last 15 Encounters:  05/01/13 243 lb 8 oz (110.451 kg)  03/26/13 241 lb 9.6 oz (109.589 kg)  03/19/13 237 lb 8 oz (107.729 kg)  02/26/13 234 lb 3.2 oz (106.232 kg)  02/09/13 233 lb 6.4 oz (105.87 kg)  01/30/13 231 lb 9.6 oz (105.053 kg)  01/26/13 232 lb 12.8 oz (105.597 kg)  12/22/12 228 lb 12.8 oz (103.783 kg)  12/18/12 228 lb 3.2 oz (103.511 kg)  11/25/12 221 lb 1 oz (100.273 kg)  11/25/12 221 lb 1 oz (100.273 kg)  11/21/12 226 lb 6.4 oz (102.694 kg)  11/06/12 234 lb (106.142 kg)  11/06/12 234 lb (106.142 kg)  12/14/11 230 lb (104.327 kg)    BMI: 40.5 Patient meets criteria for Morbid Obesity Class III based on current BMI.   Current diet order is NPO. Labs and medications reviewed.   No nutrition interventions warranted at this time. If nutrition issues arise, please consult RD.   Kendell Bane RD, LDN, CNSC [phone] Pager [phone] After Hours Pager

## 2013-05-04 NOTE — Op Note (Unsigned)
[name], [name] NO.:  [id number]  MEDICAL RECORD NO.:  [id number]  LOCATION:  5N31C                        FACILITY:  [facility]  PHYSICIAN:  Maisie Fus A. 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  PROCEDURES: 1. Takedown of colostomy. 2. Rigid sigmoidoscopy. 3. Extensive lysis of adhesions taking 60 minutes.  SURGEON:  Maisie Fus A. Katelynd Blauvelt, M.D.  ASSISTANT:  Almond Lint, MD  EBL:  200 mL.  IV FLUIDS:  1.2 liters of crystalloid.  DRAINS:  Nineteen round Jackson-Pratt drain in the pelvis.  SPECIMEN:  Sigmoid colon to Pathology.  INDICATIONS FOR PROCEDURE:  The patient is a 52 year old obese female who underwent a sigmoid colectomy in 2011, secondary to perforated sigmoid diverticulitis and had an end-colostomy.  She did not have insurance at that time and did not seek to have reverse.  She developed a large parastomal hernia and eventually got insurance.  She was seen in February of this year with small-bowel obstruction due to an incarcerated peristomal hernia that I repaired emergently, but given her small-bowel distention and her acuteness of illness, reversal was not done.  She was recovered from all this and underwent evaluation and was found to have adequate rectal stump and actually some residual sigmoid colon left behind.  The remainder of her barium enema was normal.  She did have some scattered diverticula.  We discussed takedown of her colostomy.  This would be a difficult procedure for her.  Risk of bleeding, infection, death, anastomotic leakage, the need for the surgeries, replacement of ostomy, small-bowel obstruction, bowel  injury, injury to other internal viscera, the need for emergent surgery, pulmonary failure, cardiac failure, renal failure, and other potential outcomes depending on how she did was discussed.  We discussed all these in great length preoperatively.  She has stopped smoking, which I thought was a plus.  After explanation of all these, she wished to proceed.  DESCRIPTION OF PROCEDURE:  The patient was met in the holding area. Questions were answered.  She was taken back to the operating room and placed supine on the operating room table.  After induction of general anesthesia, she was placed in lithotomy, appropriately padded, and a Foley catheter was placed under sterile conditions.  Perineum and abdomen were prepped and draped in sterile fashion.  Time-out was done. Prior to this, we closed her ostomy with a pursestring suture of Vicryl. After time-out was done, then I excised her old scar and her midline wound.  Dissection was carried down to her fascia and abdominal cavity was entered.  She had dense intra-abdominal adhesions.  It took about an hour takedown by themselves.  Once we did this, we got around the colostomy circumferentially.  I then mobilized the small bowel of the pelvis.  We mobilized the small bowel quite extensively to create room in the pelvis.  We went ahead and then excised the colostomy using cautery down to the fascia.  We brought the bowel back inside the abdominal cavity.  We had plenty of length that look like.  There was some residual descending sigmoid colon.  We went ahead and excised this using the LigaSure for the mesentery down to the junction of the rectosigmoid.  Then, we divided it at this point with ECHELON stapling device.  The remainder of the sigmoid colon was sent to Pathology.  We then trimmed back the colostomy using a GIA-75 stapling device.  We had adequate length of the distal transverse colon into the pelvis.  We then used the EEA Sizers  and placed a pursestring suture across the proximal colon.  This was a 2-0 Prolene.  We excised the staple line and we were able to easily pass a 29 size stapler into the proximal colon.  Anvil was placed.  The pursestring suture was tied down.  Dr. Donell Beers went below.  We did significant dilation of her anal canal.  We passed a 25 Sizer through the rectal stump and passed it all the way to the staple line.  We then passed the 29 Sizer to dilate up the rectum as well.  We then placed EEA stapler per rectum.  It was guided up with spike deployed anterior to the staple line.  I then made sure that the colon was twisted, attached it down to the spike and we closed it down carefully making sure not to include any neighboring structures.  This was fired and withdrawn.  Two complete donuts were noted.  During the initial, we clamped the bowel proximally to anastomosis and insufflated some air.  Initially, there was no air, but then, we saw a few bubbles of air.  We suctioned out the fluid and saw this was anterior.  We oversewed this with silk suture 2-0.  We then insufflated again and there was no further air leak.  The air leak was seemed to be quite small on the anterior wall.  We tested it two or three more times by distending it quite tense with air and I could not get any further air leakage from the anastomosis.  I went ahead and placed a 19 round drain in the pelvis.  I did not think diversion at this point is necessary given her drainage tube in place in the fact that I could not recreate air leak after closing.  Remainder of her rigid sigmoidoscopy was normal.  The CO2 was removed.  Small bowel was examined and I saw no evidence of any serosal injury or tear.  Remainder of the irrigation was suctioned out.  A 19 round Blake drain was placed to right lower quadrant separate stab incision near the pelvis.  Secured to the skin with 2-0 nylon.  We then closed the ostomy site with two layers  with layer in the inside of #1 Novafil pop-offs.  The anterior defect was closed well through the actual ostomy site with #1 Novafil pop-offs. Sponges were found to be correct.  Prior to this, the NG tube was positioned in the stomach.  We closed the fascia with #1 running PDS. Skin staples were used for the skin.  We packed the ostomy site open. Dressings were applied.  Drain was placed to bulb suction.  All final counts of sponge, needle, and instruments were found to be correct at this portion of the case.  The patient was then awoke, extubated, and taken to recovery in satisfactory condition.     Alden Bensinger A. Kerri Kovacik, M.D.  TAC/MEDQ  D:  05/03/2013  T:  05/04/2013  Job:  [id number]

## 2013-05-05 NOTE — Progress Notes (Signed)
2 Days Post-Op  Subjective: Stable and alert. Afebrile. Good urine output.Nausea, no vomiting. Tolerating ice chips but does not want clear liquids. Voiding without difficulty. Pain control seems adequate. Ambulating in room. No flatus or stool.  Objective: Vital signs in last 24 hours: Temp:  [98.6 F (37 C)-99 F (37.2 C)] 98.8 F (37.1 C) (08/16 0600) Pulse Rate:  [93-104] 100 (08/16 0600) Resp:  [9-18] 16 (08/16 0800) BP: (116-142)/(56-67) 116/59 mmHg (08/16 0600) SpO2:  [93 %-100 %] 94 % (08/16 0800) Last BM Date: 05/03/13  Intake/Output from previous day: 08/15 0701 - 08/16 0700 In: 1520 [P.O.:240; I.V.:1200] Out: -  Intake/Output this shift:    General appearance: alert. Minimal distress. Mental status normal. Resp: clear to auscultation bilaterally GI: abdomen obese. Soft. Hypoactive bowel sounds. Wounds looked fine. JP drainage serosanguineous, odorless.  Lab Results:  No results found for this or any previous visit (from the past 24 hour(s)).   Studies/Results: @RISRSLT24 @  . alvimopan  12 mg Oral BID  . enoxaparin (LOVENOX) injection  40 mg Subcutaneous Q24H  . HYDROmorphone PCA 0.3 mg/mL   Intravenous Q4H     Assessment/Plan: s/p Procedure(s): COLOSTOMY CLOSURE  POD#2. Resection and closure of colostomy with EEA stapler. Stable. Continue entereg and clear liquids Await resolution of ileus Increase ambulation and incentive promptly. This was dressed.  @PROBHOSP @  LOS: 2 days    Tempie Gibeault M 05/05/2013  . .prob

## 2013-05-06 LAB — CBC
HCT: 30.3 % — ABNORMAL LOW (ref 36.0–46.0)
Hemoglobin: 10.2 g/dL — ABNORMAL LOW (ref 12.0–15.0)
MCH: 29.5 pg (ref 26.0–34.0)
MCHC: 33.7 g/dL (ref 30.0–36.0)
MCV: 87.6 fL (ref 78.0–100.0)
Platelets: 207 K/uL (ref 150–400)
RBC: 3.46 MIL/uL — ABNORMAL LOW (ref 3.87–5.11)
RDW: 13.1 % (ref 11.5–15.5)
WBC: 9.1 K/uL (ref 4.0–10.5)

## 2013-05-06 LAB — BASIC METABOLIC PANEL WITH GFR
BUN: 10 mg/dL (ref 6–23)
CO2: 26 meq/L (ref 19–32)
Calcium: 8.5 mg/dL (ref 8.4–10.5)
Chloride: 107 meq/L (ref 96–112)
Creatinine, Ser: 0.64 mg/dL (ref 0.50–1.10)
GFR calc Af Amer: >90 mL/min
GFR calc non Af Amer: >90 mL/min
Glucose, Bld: 147 mg/dL — ABNORMAL HIGH (ref 70–99)
Potassium: 3.7 meq/L (ref 3.5–5.1)
Sodium: 140 meq/L (ref 135–145)

## 2013-05-06 NOTE — Progress Notes (Addendum)
3 Days Post-Op  Subjective: Stable and alert. Afebrile. Ambulated in the hall  today. Getting up to chair. Voiding well with good urine output. Still nauseated and doesn't want to drink much. No flatus or stool.  Objective: Vital signs in last 24 hours: Temp:  [98 F (36.7 C)-98.6 F (37 C)] 98.6 F (37 C) (08/17 0712) Pulse Rate:  [94-97] 97 (08/17 0712) Resp:  [14-18] 17 (08/17 0712) BP: (131-139)/(61-71) 131/71 mmHg (08/17 0712) SpO2:  [95 %-99 %] 99 % (08/17 0712) Last BM Date: 05/03/13  Intake/Output from previous day:   Intake/Output this shift:    General appearance: alert. Cooperative. Mild distress, was recently up in chair. Mental status normal Resp: clear to auscultation bilaterally GI: abdomen soft. Minimal bowel sounds. Nondistended. Wounds looked fine.  Lab Results:  No results found for this or any previous visit (from the past 24 hour(s)).   Studies/Results: @RISRSLT24 @  . alvimopan  12 mg Oral BID  . enoxaparin (LOVENOX) injection  40 mg Subcutaneous Q24H  . HYDROmorphone PCA 0.3 mg/mL   Intravenous Q4H     Assessment/Plan: s/p Procedure(s): COLOSTOMY CLOSURE  POD#3. Resection and closure of colostomy with EEA stapler. Stable.  Continue entereg and clear liquids  Await resolution of ileus  Increase ambulation and incentive spirometry.  Wound care orders written Check labs   @PROBHOSP @  LOS: 3 days    Aveah Castell M 05/06/2013  . .prob

## 2013-05-07 NOTE — Progress Notes (Signed)
4 Days Post-Op  Subjective: HAVING FLATUS.  Pain under decent control.  Ambulating.   Objective: Vital signs in last 24 hours: Temp:  [98.3 F (36.8 C)-98.9 F (37.2 C)] 98.3 F (36.8 C) (08/18 0600) Pulse Rate:  [90-97] 93 (08/18 0600) Resp:  [10-18] 10 (08/18 0600) BP: (103-131)/(47-71) 103/47 mmHg (08/18 0600) SpO2:  [96 %-100 %] 99 % (08/18 0600) Last BM Date: 05/03/13  Intake/Output from previous day: 08/17 0701 - 08/18 0700 In: 690 [I.V.:600] Out: -  Intake/Output this shift: Total I/O In: 690 [I.V.:600; Other:90] Out: -   Resp: clear to auscultation bilaterally Cardio: regular rate and rhythm, S1, S2 normal, no murmur, click, rub or gallop Incision/Wound:clean dry intact.  Packed wound clean  Lab Results:   Recent Labs  05/06/13 1055  WBC 9.1  HGB 10.2*  HCT 30.3*  PLT 207   BMET  Recent Labs  05/06/13 1055  NA 140  K 3.7  CL 107  CO2 26  GLUCOSE 147*  BUN 10  CREATININE 0.64  CALCIUM 8.5   PT/INR No results found for this basename: LABPROT, INR,  in the last 72 hours ABG No results found for this basename: PHART, PCO2, PO2, HCO3,  in the last 72 hours  Studies/Results: No results found.  Anti-infectives: Anti-infectives   Start     Dose/Rate Route Frequency Ordered Stop   05/03/13 1930  cefOXitin (MEFOXIN) 1 g in dextrose 5 % 50 mL IVPB     1 g 100 mL/hr over 30 Minutes Intravenous 4 times per day 05/03/13 1920 05/03/13 2006   05/03/13 0852  neomycin (MYCIFRADIN) tablet 1,000 mg  Status:  Discontinued     1,000 mg Oral 3 times per day 05/03/13 0852 05/03/13 1850   05/03/13 0852  erythromycin (E-MYCIN) tablet 1,000 mg  Status:  Discontinued     1,000 mg Oral 3 times per day 05/03/13 0852 05/03/13 1850   05/03/13 0600  cefoTEtan (CEFOTAN) 2 g in dextrose 5 % 50 mL IVPB     2 g 100 mL/hr over 30 Minutes Intravenous On call to O.R. 05/02/13 1420 05/03/13 1138      Assessment/Plan: s/p Procedure(s): COLOSTOMY CLOSURE (N/A) On clears.   Will advance as she has more bowel function OOB WOUNDS OK Keep DRAIN UNTIL BM  LOS: 4 days    [name] A. 05/07/2013

## 2013-05-07 NOTE — Progress Notes (Signed)
Pt wanted PCA discontinued today.  Called MD x4, never an answer, voicemail or pager given.  Dressing changed.

## 2013-05-08 MED ORDER — HYDROMORPHONE HCL PF 1 MG/ML IJ SOLN
1.0000 mg | INTRAMUSCULAR | Status: DC | PRN
  Administered 2013-05-08 (×2): 1 mg via INTRAVENOUS
  Filled 2013-05-08 (×2): qty 1

## 2013-05-08 MED ORDER — POLYETHYLENE GLYCOL 3350 17 G PO PACK
17.0000 g | PACK | Freq: Every day | ORAL | Status: DC
  Administered 2013-05-08: 17 g via ORAL
  Filled 2013-05-08 (×2): qty 1

## 2013-05-08 NOTE — Progress Notes (Signed)
5 Days Post-Op  Subjective: Moving bowels.  Wants to get rid of PCA.    Objective: Vital signs in last 24 hours: Temp:  [97.7 F (36.5 C)-98.7 F (37.1 C)] 97.7 F (36.5 C) (08/19 0554) Pulse Rate:  [84-98] 84 (08/19 0554) Resp:  [14-18] 16 (08/19 0554) BP: (104-135)/(59-68) 109/60 mmHg (08/19 0554) SpO2:  [94 %-100 %] 96 % (08/19 0554) Last BM Date: 05/07/13  Intake/Output from previous day: 08/18 0701 - 08/19 0700 In: 1140 [P.O.:240; I.V.:900] Out: 30 [Drains:30] Intake/Output this shift:    Incision/Wound:CLEAN  ABDOMEN OBESE  BUT SOFT  Lab Results:   Recent Labs  05/06/13 1055  WBC 9.1  HGB 10.2*  HCT 30.3*  PLT 207   BMET  Recent Labs  05/06/13 1055  NA 140  K 3.7  CL 107  CO2 26  GLUCOSE 147*  BUN 10  CREATININE 0.64  CALCIUM 8.5   PT/INR No results found for this basename: LABPROT, INR,  in the last 72 hours ABG No results found for this basename: PHART, PCO2, PO2, HCO3,  in the last 72 hours  Studies/Results: No results found.  Anti-infectives: Anti-infectives   Start     Dose/Rate Route Frequency Ordered Stop   05/03/13 1930  cefOXitin (MEFOXIN) 1 g in dextrose 5 % 50 mL IVPB     1 g 100 mL/hr over 30 Minutes Intravenous 4 times per day 05/03/13 1920 05/03/13 2006   05/03/13 0852  neomycin (MYCIFRADIN) tablet 1,000 mg  Status:  Discontinued     1,000 mg Oral 3 times per day 05/03/13 0852 05/03/13 1850   05/03/13 0852  erythromycin (E-MYCIN) tablet 1,000 mg  Status:  Discontinued     1,000 mg Oral 3 times per day 05/03/13 0852 05/03/13 1850   05/03/13 0600  cefoTEtan (CEFOTAN) 2 g in dextrose 5 % 50 mL IVPB     2 g 100 mL/hr over 30 Minutes Intravenous On call to O.R. 05/02/13 1420 05/03/13 1138      Assessment/Plan: s/p Procedure(s): COLOSTOMY CLOSURE (N/A) Advance diet Plan for discharge tomorrow  LOS: 5 days    [name] A. 05/08/2013

## 2013-05-09 ENCOUNTER — Other Ambulatory Visit (INDEPENDENT_AMBULATORY_CARE_PROVIDER_SITE_OTHER): Payer: Self-pay | Admitting: *Deleted

## 2013-05-09 ENCOUNTER — Telehealth (INDEPENDENT_AMBULATORY_CARE_PROVIDER_SITE_OTHER): Payer: Self-pay

## 2013-05-09 MED ORDER — FENTANYL 50 MCG/HR TD PT72: 1.0000 | MEDICATED_PATCH | TRANSDERMAL | Status: DC

## 2013-05-09 MED ORDER — OXYCODONE HCL 10 MG PO TABS: 10.0000 mg | ORAL_TABLET | ORAL | Status: DC | PRN

## 2013-05-09 MED ORDER — OXYCODONE HCL 5 MG PO TABS: 10.0000 mg | ORAL_TABLET | Freq: Four times a day (QID) | ORAL | Status: DC | PRN

## 2013-05-09 MED ORDER — POLYETHYLENE GLYCOL 3350 17 G PO PACK: 17.0000 g | PACK | Freq: Every day | ORAL | Status: DC

## 2013-05-09 MED ORDER — FENTANYL 50 MCG/HR TD PT72
50.0000 ug | MEDICATED_PATCH | TRANSDERMAL | Status: DC
  Administered 2013-05-09: 50 ug via TRANSDERMAL
  Filled 2013-05-09: qty 1

## 2013-05-09 MED ORDER — FENTANYL 50 MCG/HR TD PT72: 10.0000 | MEDICATED_PATCH | TRANSDERMAL | Status: DC

## 2013-05-09 NOTE — Progress Notes (Signed)
6 Days Post-Op  Subjective: Moving bowels. Pain an issue when she moves. Tolerating diet.  Objective: Vital signs in last 24 hours: Temp:  [98.2 F (36.8 C)-98.8 F (37.1 C)] 98.2 F (36.8 C) (08/20 0600) Pulse Rate:  [84-92] 84 (08/20 0600) Resp:  [16-18] 18 (08/20 0600) BP: (110-136)/(55-74) 110/62 mmHg (08/20 0600) SpO2:  [92 %-96 %] 92 % (08/20 0600) Last BM Date: 05/08/13  Intake/Output from previous day: 08/19 0701 - 08/20 0700 In: 360 [P.O.:360] Out: -  Intake/Output this shift:    Incision/Wound:clean  Slight separation noted 5 mm central wound.  Ostomy wound open and clean. JP serous.   Lab Results:   Recent Labs  05/06/13 1055  WBC 9.1  HGB 10.2*  HCT 30.3*  PLT 207   BMET  Recent Labs  05/06/13 1055  NA 140  K 3.7  CL 107  CO2 26  GLUCOSE 147*  BUN 10  CREATININE 0.64  CALCIUM 8.5   PT/INR No results found for this basename: LABPROT, INR,  in the last 72 hours ABG No results found for this basename: PHART, PCO2, PO2, HCO3,  in the last 72 hours  Studies/Results: No results found.  Anti-infectives: Anti-infectives   Start     Dose/Rate Route Frequency Ordered Stop   05/03/13 1930  cefOXitin (MEFOXIN) 1 g in dextrose 5 % 50 mL IVPB     1 g 100 mL/hr over 30 Minutes Intravenous 4 times per day 05/03/13 1920 05/03/13 2006   05/03/13 0852  neomycin (MYCIFRADIN) tablet 1,000 mg  Status:  Discontinued     1,000 mg Oral 3 times per day 05/03/13 0852 05/03/13 1850   05/03/13 0852  erythromycin (E-MYCIN) tablet 1,000 mg  Status:  Discontinued     1,000 mg Oral 3 times per day 05/03/13 0852 05/03/13 1850   05/03/13 0600  cefoTEtan (CEFOTAN) 2 g in dextrose 5 % 50 mL IVPB     2 g 100 mL/hr over 30 Minutes Intravenous On call to O.R. 05/02/13 1420 05/03/13 1138      Assessment/Plan: s/p Procedure(s): COLOSTOMY CLOSURE (N/A) Discharge  LOS: 6 days    [name] A. 05/09/2013

## 2013-05-09 NOTE — Discharge Summary (Signed)
Physician Discharge Summary  Patient ID: [name] MRN: [id number] DOB/AGE: [id number] 54 y.o.  Admit date: 05/03/2013 Discharge date: 05/09/2013  Admission Diagnoses:colostomy present Patient Active Problem List   Diagnosis Date Noted  . Wound disruption, post-op, skin 01/30/2013  . Preoperative respiratory examination 12/18/2012  . Smoking 12/18/2012  . Heme positive stool 11/25/2012  . Hypokalemia 11/25/2012  . COPD (chronic obstructive pulmonary disease)   . Acid reflux disease   . Post-operative state 11/21/2012  . Acute renal failure 11/04/2012  . HAP (hospital-acquired pneumonia) 11/03/2012  . Acute pulmonary edema 11/03/2012  . Acute respiratory failure with hypoxia 11/02/2012  . Acute exacerbation of COPD with asthma 11/02/2012  . Parastomal hernia 11/02/2012  . Fibromyalgia 11/02/2012  . Generalized anxiety disorder 11/02/2012  . Restless legs syndrome 11/02/2012  . Obesity (BMI 35.0-39.9 without comorbidity) 11/02/2012  . Leukocytosis, unspecified 11/02/2012  . Hyperglycemia 11/02/2012  . Cigarette nicotine dependence with withdrawal 11/02/2012  . Complete atelectasis 11/02/2012    Discharge Diagnoses: same Active Problems:   * No active hospital problems. *  Patient Active Problem List   Diagnosis Date Noted  . Wound disruption, post-op, skin 01/30/2013  . Preoperative respiratory examination 12/18/2012  . Smoking 12/18/2012  . Heme positive stool 11/25/2012  . Hypokalemia 11/25/2012  . COPD (chronic obstructive pulmonary disease)   . Acid reflux disease   . Post-operative state 11/21/2012  . Acute renal failure 11/04/2012  . HAP (hospital-acquired pneumonia) 11/03/2012  . Acute pulmonary edema 11/03/2012  . Acute respiratory failure with hypoxia 11/02/2012  . Acute exacerbation of COPD with asthma 11/02/2012  . Parastomal hernia 11/02/2012  . Fibromyalgia 11/02/2012  . Generalized anxiety disorder 11/02/2012  . Restless legs syndrome  11/02/2012  . Obesity (BMI 35.0-39.9 without comorbidity) 11/02/2012  . Leukocytosis, unspecified 11/02/2012  . Hyperglycemia 11/02/2012  . Cigarette nicotine dependence with withdrawal 11/02/2012  . Complete atelectasis 11/02/2012   Discharged Condition: good  Hospital Course: patient underwent colostomy reversal. She did well. She had slow return of bowel function and diet started on postop day 3.bowel function returned postop day 4.she ambulated without difficulty. Wounds were clean. Abdomen soft. Pain with ambulation. Controlled with narcotics. Home health nursing set up at discharge. Vital signs are stable. Exam shows small separation a midline skin incision requires no care except dry dressing. No fever or chills. Labs are stable. Pulmonary Functions Testing Results:  No results found for this basename: fev1, fvc, fev84fvc, tlc, dlco    Consults: None  Significant Diagnostic Studies: labs:  CBC    Component Value Date/Time   WBC 9.1 05/06/2013 1055   RBC 3.46* 05/06/2013 1055   RBC 4.26 07/01/2009 0824   HGB 10.2* 05/06/2013 1055   HCT 30.3* 05/06/2013 1055   PLT 207 05/06/2013 1055   MCV 87.6 05/06/2013 1055   MCH 29.5 05/06/2013 1055   MCHC 33.7 05/06/2013 1055   RDW 13.1 05/06/2013 1055   LYMPHSABS 2.6 05/01/2013 1413   MONOABS 0.6 05/01/2013 1413   EOSABS 0.1 05/01/2013 1413   BASOSABS 0.0 05/01/2013 1413     Treatments: surgery: colostomy closure  Discharge Exam: Blood pressure 110/62, pulse 84, temperature 98.2 F (36.8 C), temperature source Oral, resp. rate 18, last menstrual period 09/21/2011, SpO2 92.00%. Resp: clear to auscultation bilaterally Cardio: regular rate and rhythm, S1, S2 normal, no murmur, click, rub or gallop Incision/Wound:clean with 5 mm area of separation noted mid incision. No signs of infection. Ostomy wound clean.JP serous  Disposition: 01-Home or Self Care  Discharge Orders   Future Appointments Provider Department Dept Phone   05/18/2013 11:20  AM Maisie Fus A. Breann Losano, MD [facility] [place] [phone]   Future Orders Complete By Expires   Diet general  As directed    Discharge instructions  As directed    Comments:     Contact Home health nursing when you get home.   Discharge wound care:  As directed    Comments:     Wound care per HHN.  Wet to dry dressing to ostomy site BID.  Empty drain daily.  Keep wound covered with clean gauze and change daily. Staple out next week.   Driving Restrictions  As directed    Comments:     No driving for 2 weeks   Increase activity slowly  As directed    Lifting restrictions  As directed    Comments:     No lifting for 4 weeks       Medication List         amitriptyline 100 MG tablet  Commonly known as:  ELAVIL  Take 100 mg by mouth at bedtime.     diazepam 10 MG tablet  Commonly known as:  VALIUM  Take 10 mg by mouth every 8 (eight) hours as needed for anxiety.     esomeprazole 40 MG capsule  Commonly known as:  NEXIUM  Take 40 mg by mouth daily before breakfast.     fentaNYL 50 MCG/HR  Commonly known as:  DURAGESIC - dosed mcg/hr  Place 10 patches (500 mcg total) onto the skin every 3 (three) days.     Oxycodone HCl 10 MG Tabs  Take 1 tablet (10 mg total) by mouth every 4 (four) hours as needed.     polyethylene glycol packet  Commonly known as:  MIRALAX / GLYCOLAX  Take 17 g by mouth daily.           Follow-up Information   Follow up with Traeton Bordas A., MD. Schedule an appointment as soon as possible for a visit in 1 week.   Specialty:  General Surgery   Contact information:   [address] [place] [address] [phone]       Signed: Dortha Schwalbe 05/09/2013, 6:49 AM

## 2013-05-09 NOTE — Telephone Encounter (Signed)
Called pt to check and make sure home health was set up. She said they were at her house right now doing dressing changes. She will follow up next Friday 8/29 with Dr Luisa Hart.

## 2013-05-09 NOTE — Telephone Encounter (Signed)
Message copied by Brennan Bailey on Wed May 09, 2013  3:49 PM ------      Message from: Harriette Bouillon A      Created: Wed May 09, 2013  6:56 AM       Needs HHN pt has spoken with advance.  Needs wet to dry dressing ostomy site wound BIB and drain care. ------

## 2013-05-10 ENCOUNTER — Emergency Department (HOSPITAL_COMMUNITY)
Admission: EM | Admit: 2013-05-10 | Discharge: 2013-05-10 | Discharge status: Against Medical Advice | Payer: 59 | Attending: Emergency Medicine | Admitting: Emergency Medicine

## 2013-05-10 ENCOUNTER — Encounter (HOSPITAL_COMMUNITY): Payer: Self-pay | Admitting: Emergency Medicine

## 2013-05-10 ENCOUNTER — Telehealth (INDEPENDENT_AMBULATORY_CARE_PROVIDER_SITE_OTHER): Payer: Self-pay | Admitting: General Surgery

## 2013-05-10 ENCOUNTER — Other Ambulatory Visit (INDEPENDENT_AMBULATORY_CARE_PROVIDER_SITE_OTHER): Payer: Self-pay | Admitting: Surgery

## 2013-05-10 DIAGNOSIS — J45909 Unspecified asthma, uncomplicated: Secondary | ICD-10-CM | POA: Insufficient documentation

## 2013-05-10 DIAGNOSIS — J449 Chronic obstructive pulmonary disease, unspecified: Secondary | ICD-10-CM | POA: Insufficient documentation

## 2013-05-10 DIAGNOSIS — J4489 Other specified chronic obstructive pulmonary disease: Secondary | ICD-10-CM | POA: Insufficient documentation

## 2013-05-10 DIAGNOSIS — Z8719 Personal history of other diseases of the digestive system: Secondary | ICD-10-CM | POA: Insufficient documentation

## 2013-05-10 DIAGNOSIS — Z79899 Other long term (current) drug therapy: Secondary | ICD-10-CM | POA: Insufficient documentation

## 2013-05-10 DIAGNOSIS — Z8669 Personal history of other diseases of the nervous system and sense organs: Secondary | ICD-10-CM | POA: Insufficient documentation

## 2013-05-10 DIAGNOSIS — K219 Gastro-esophageal reflux disease without esophagitis: Secondary | ICD-10-CM | POA: Insufficient documentation

## 2013-05-10 DIAGNOSIS — Z87442 Personal history of urinary calculi: Secondary | ICD-10-CM | POA: Insufficient documentation

## 2013-05-10 DIAGNOSIS — R110 Nausea: Secondary | ICD-10-CM | POA: Insufficient documentation

## 2013-05-10 DIAGNOSIS — Z9889 Other specified postprocedural states: Secondary | ICD-10-CM | POA: Insufficient documentation

## 2013-05-10 DIAGNOSIS — Z87891 Personal history of nicotine dependence: Secondary | ICD-10-CM | POA: Insufficient documentation

## 2013-05-10 DIAGNOSIS — R42 Dizziness and giddiness: Secondary | ICD-10-CM | POA: Insufficient documentation

## 2013-05-10 DIAGNOSIS — F411 Generalized anxiety disorder: Secondary | ICD-10-CM | POA: Insufficient documentation

## 2013-05-10 DIAGNOSIS — IMO0001 Reserved for inherently not codable concepts without codable children: Secondary | ICD-10-CM | POA: Insufficient documentation

## 2013-05-10 DIAGNOSIS — R509 Fever, unspecified: Secondary | ICD-10-CM | POA: Insufficient documentation

## 2013-05-10 DIAGNOSIS — E119 Type 2 diabetes mellitus without complications: Secondary | ICD-10-CM | POA: Insufficient documentation

## 2013-05-10 DIAGNOSIS — G2581 Restless legs syndrome: Secondary | ICD-10-CM | POA: Insufficient documentation

## 2013-05-10 MED ORDER — ONDANSETRON 4 MG PO TBDP: 4.0000 mg | ORAL_TABLET | Freq: Four times a day (QID) | ORAL | Status: DC | PRN

## 2013-05-10 NOTE — Telephone Encounter (Signed)
Spoke with [name] and she explained that she has been having nausea since right after surgery but that she forgot to mention it to Dr. Luisa Hart upon discharge.  I asked her if she has been vomiting and she explained that she had not.  She is not running a fever and is doing considerably well other than the nausea.  I explained that per protocol, Zofran 4mg  was called into her pharmacy.

## 2013-05-10 NOTE — ED Notes (Signed)
PT. REPORTS FEVER THIS EVENING AT HOME = 101.8 WITH SLIGHT NAUSEA AND DIZZINESS . PT. STATED COLOSTOMY REVERSAL SURGERY LAST WEEK BY DR. Mignon Pine.

## 2013-05-10 NOTE — Telephone Encounter (Signed)
Beth from [facility] : [facility] called to let us know that the patient is having some nausea post operatively.  I explained that I would call the patient to receive a little more information and then I would address the problem.

## 2013-05-11 ENCOUNTER — Telehealth (INDEPENDENT_AMBULATORY_CARE_PROVIDER_SITE_OTHER): Payer: Self-pay

## 2013-05-11 NOTE — Telephone Encounter (Signed)
Patient called in stating she had a couple staples comes loose and pop off. She states incision has not opened up where staples were in place. Patient denies drainage, redness, swelling and any signs of infection. I offered an appointment to come in this afternoon for a nurse visit to have it looked at and steri strips applied for extra support for incision. She states she will just keep and eye on incision for today and the weekend and call back if anything changes with it.

## 2013-05-18 ENCOUNTER — Emergency Department (HOSPITAL_COMMUNITY)
Admission: EM | Admit: 2013-05-18 | Discharge: 2013-05-18 | Disposition: A | Discharge status: Home / Self Care | Payer: 59 | Attending: Emergency Medicine | Admitting: Emergency Medicine

## 2013-05-18 ENCOUNTER — Encounter (INDEPENDENT_AMBULATORY_CARE_PROVIDER_SITE_OTHER): Payer: 59 | Admitting: Surgery

## 2013-05-18 DIAGNOSIS — Z87891 Personal history of nicotine dependence: Secondary | ICD-10-CM | POA: Insufficient documentation

## 2013-05-18 DIAGNOSIS — K219 Gastro-esophageal reflux disease without esophagitis: Secondary | ICD-10-CM | POA: Insufficient documentation

## 2013-05-18 DIAGNOSIS — G8929 Other chronic pain: Secondary | ICD-10-CM | POA: Insufficient documentation

## 2013-05-18 DIAGNOSIS — E119 Type 2 diabetes mellitus without complications: Secondary | ICD-10-CM | POA: Insufficient documentation

## 2013-05-18 DIAGNOSIS — R110 Nausea: Secondary | ICD-10-CM | POA: Insufficient documentation

## 2013-05-18 DIAGNOSIS — Z79899 Other long term (current) drug therapy: Secondary | ICD-10-CM | POA: Insufficient documentation

## 2013-05-18 DIAGNOSIS — J4489 Other specified chronic obstructive pulmonary disease: Secondary | ICD-10-CM | POA: Insufficient documentation

## 2013-05-18 DIAGNOSIS — Z8669 Personal history of other diseases of the nervous system and sense organs: Secondary | ICD-10-CM | POA: Insufficient documentation

## 2013-05-18 DIAGNOSIS — R21 Rash and other nonspecific skin eruption: Secondary | ICD-10-CM | POA: Insufficient documentation

## 2013-05-18 DIAGNOSIS — J449 Chronic obstructive pulmonary disease, unspecified: Secondary | ICD-10-CM | POA: Insufficient documentation

## 2013-05-18 DIAGNOSIS — R109 Unspecified abdominal pain: Secondary | ICD-10-CM | POA: Insufficient documentation

## 2013-05-18 DIAGNOSIS — T8131XA Disruption of external operation (surgical) wound, not elsewhere classified, initial encounter: Secondary | ICD-10-CM

## 2013-05-18 DIAGNOSIS — Z8719 Personal history of other diseases of the digestive system: Secondary | ICD-10-CM | POA: Insufficient documentation

## 2013-05-18 DIAGNOSIS — Y833 Surgical operation with formation of external stoma as the cause of abnormal reaction of the patient, or of later complication, without mention of misadventure at the time of the procedure: Secondary | ICD-10-CM | POA: Insufficient documentation

## 2013-05-18 DIAGNOSIS — IMO0001 Reserved for inherently not codable concepts without codable children: Secondary | ICD-10-CM | POA: Insufficient documentation

## 2013-05-18 DIAGNOSIS — Z90710 Acquired absence of both cervix and uterus: Secondary | ICD-10-CM | POA: Insufficient documentation

## 2013-05-18 DIAGNOSIS — Z87442 Personal history of urinary calculi: Secondary | ICD-10-CM | POA: Insufficient documentation

## 2013-05-18 DIAGNOSIS — Z9889 Other specified postprocedural states: Secondary | ICD-10-CM | POA: Insufficient documentation

## 2013-05-18 DIAGNOSIS — F411 Generalized anxiety disorder: Secondary | ICD-10-CM | POA: Insufficient documentation

## 2013-05-18 DIAGNOSIS — Z9089 Acquired absence of other organs: Secondary | ICD-10-CM | POA: Insufficient documentation

## 2013-05-18 LAB — CBC WITH DIFFERENTIAL/PLATELET
Basophils Absolute: 0 10*3/uL (ref 0.0–0.1)
Basophils Relative: 0 % (ref 0–1)
Eosinophils Absolute: 0.3 10*3/uL (ref 0.0–0.7)
Eosinophils Relative: 2 % (ref 0–5)
HCT: 31.4 % — ABNORMAL LOW (ref 36.0–46.0)
Hemoglobin: 10.1 g/dL — ABNORMAL LOW (ref 12.0–15.0)
Lymphocytes Relative: 13 % (ref 12–46)
Lymphs Abs: 1.3 10*3/uL (ref 0.7–4.0)
MCH: 28.5 pg (ref 26.0–34.0)
MCHC: 32.2 g/dL (ref 30.0–36.0)
MCV: 88.7 fL (ref 78.0–100.0)
Monocytes Absolute: 1.3 10*3/uL — ABNORMAL HIGH (ref 0.1–1.0)
Monocytes Relative: 12 % (ref 3–12)
Neutro Abs: 7.4 10*3/uL (ref 1.7–7.7)
Neutrophils Relative %: 72 % (ref 43–77)
Platelets: 407 10*3/uL — ABNORMAL HIGH (ref 150–400)
RBC: 3.54 MIL/uL — ABNORMAL LOW (ref 3.87–5.11)
RDW: 14.3 % (ref 11.5–15.5)
WBC: 10.3 10*3/uL (ref 4.0–10.5)

## 2013-05-18 LAB — POCT I-STAT, CHEM 8
BUN: 10 mg/dL (ref 6–23)
Calcium, Ion: 1.14 mmol/L (ref 1.12–1.23)
Chloride: 104 mEq/L (ref 96–112)
Creatinine, Ser: 0.8 mg/dL (ref 0.50–1.10)
Glucose, Bld: 167 mg/dL — ABNORMAL HIGH (ref 70–99)
HCT: 31 % — ABNORMAL LOW (ref 36.0–46.0)
Hemoglobin: 10.5 g/dL — ABNORMAL LOW (ref 12.0–15.0)
Potassium: 4.2 mEq/L (ref 3.5–5.1)
Sodium: 139 mEq/L (ref 135–145)
TCO2: 25 mmol/L (ref 0–100)

## 2013-05-18 MED ORDER — ONDANSETRON HCL 4 MG/2ML IJ SOLN: 4.0000 mg | Freq: Once | INTRAMUSCULAR | Status: DC

## 2013-05-18 MED ORDER — ONDANSETRON HCL 4 MG/2ML IJ SOLN
4.0000 mg | Freq: Once | INTRAMUSCULAR | Status: AC
  Administered 2013-05-18: 4 mg via INTRAVENOUS

## 2013-05-18 MED ORDER — ONDANSETRON HCL 4 MG/2ML IJ SOLN
INTRAMUSCULAR | Status: AC
  Filled 2013-05-18: qty 2

## 2013-05-18 MED ORDER — CEPHALEXIN 500 MG PO CAPS: 500.0000 mg | ORAL_CAPSULE | Freq: Four times a day (QID) | ORAL | Status: DC

## 2013-05-18 MED ORDER — OXYCODONE HCL 10 MG PO TABS: 10.0000 mg | ORAL_TABLET | ORAL | Status: DC | PRN

## 2013-05-18 MED ORDER — MORPHINE SULFATE 2 MG/ML IJ SOLN
2.0000 mg | Freq: Once | INTRAMUSCULAR | Status: AC
  Administered 2013-05-18: 2 mg via INTRAVENOUS
  Filled 2013-05-18: qty 1

## 2013-05-18 MED ORDER — AMOXICILLIN-POT CLAVULANATE 875-125 MG PO TABS: 1.0000 | ORAL_TABLET | Freq: Two times a day (BID) | ORAL | Status: DC

## 2013-05-18 MED ORDER — OXYCODONE HCL 10 MG PO TABS: 10.0000 mg | ORAL_TABLET | Freq: Four times a day (QID) | ORAL | Status: DC | PRN

## 2013-05-18 NOTE — ED Notes (Addendum)
Bleeding from colostomy reversal site that was performed on 05/03/13. Has an appointment with Dr. Luisa Hart At 11 am today. Drainage controlled. Pt. Reinforced dressing with more abd. Dressings.

## 2013-05-18 NOTE — ED Provider Notes (Signed)
Medical screening examination/treatment/procedure(s) were performed by non-physician practitioner and as supervising physician I was immediately available for consultation/collaboration.   Shanna Cisco, MD 05/18/13 2001

## 2013-05-18 NOTE — Consult Note (Signed)
agree

## 2013-05-18 NOTE — Consult Note (Signed)
Reason for Consult:incision site bleeding Referring Physician: Dr. Jodelle Green   HPI: [name] underwent a colostomy reversal by Dr. Luisa Hart on 05/03/13.  She was discharged on POD #4.  She presented to the ED this morning due to bleeding from the wound which apparently soaked an adult diaper.  No previous episodes of bleeding.  She reports taking 6 ibuprofen yesterday due to pain.  Pain is alleviated down to 5/10.  She has a history of fibromyalgia, chronic pain.  She denies fever, chills or sweats.  Complains of nausea, no vomiting.  Tolerating diet and having BMs.  Denies melena or hematochezia.    Past Medical History  Diagnosis Date  . Diverticulitis   . Fibromyalgia   . Restless leg syndrome     Bilateral  . Asthma   . Bronchitis, chronic     Effects worse with URI  . Kidney stones     at least 6 stones in past  . Headache(784.0)     migraine headache  . Acid reflux disease   . Anxiety   . COPD (chronic obstructive pulmonary disease)   . Fibromyalgia   . GERD (gastroesophageal reflux disease)   . Diabetes mellitus without complication     borderline  . Vomiting     last night at 2300 after last round of antibiotic    Past Surgical History  Procedure Laterality Date  . Colostomy    . Appendectomy    . Cholecystectomy    . Partial hysterectomy      partial  . Colostomy closure N/A 11/01/2012    Procedure: COLOSTOMY CLOSURE;  Surgeon: Clovis Pu. Cornett, MD;  Location: [facility];  Service: General;  Laterality: N/A;  REPAIR PARASTOMAL HERNIA; REVISION OF COLOSTOMY  . Laparotomy  11/01/2012    Procedure: EXPLORATORY LAPAROTOMY;  Surgeon: Clovis Pu. Cornett, MD;  Location: [facility];  Service: General;;  exploratory laparotomy,repair of parastomal hernia and incisional hernia, lysis of adhesions, and application of wound V.A.C.  . Parastomal hernia repair N/A 11/01/2012    Procedure: HERNIA REPAIR PARASTOMAL;  Surgeon: Clovis Pu. Cornett, MD;  Location: [facility];  Service:  General;  Laterality: N/A;  . Incisional hernia repair N/A 11/01/2012    Procedure: HERNIA REPAIR INCISIONAL;  Surgeon: Clovis Pu. Cornett, MD;  Location: [facility];  Service: General;  Laterality: N/A;  . Lysis of adhesion N/A 11/01/2012    Procedure: LYSIS OF ADHESION;  Surgeon: Clovis Pu. Cornett, MD;  Location: [facility];  Service: General;  Laterality: N/A;  . Application of wound vac N/A 11/01/2012    Procedure: APPLICATION OF WOUND VAC;  Surgeon: Clovis Pu. Cornett, MD;  Location: [facility];  Service: General;  Laterality: N/A;  . Esophagogastroduodenoscopy Left 11/27/2012    Procedure: ESOPHAGOGASTRODUODENOSCOPY (EGD);  Surgeon: Vertell Novak., MD;  Location: [facility];  Service: Gastroenterology;  Laterality: Left;  . Abdominal hysterectomy    . Colostomy closure N/A 05/03/2013    Procedure: COLOSTOMY CLOSURE;  Surgeon: Clovis Pu. Cornett, MD;  Location: [facility];  Service: General;  Laterality: N/A;    Family History  Problem Relation Age of Onset  . Deep vein thrombosis Neg Hx   . Pulmonary embolism Neg Hx   . Diabetes Mother     Social History:  reports that she quit smoking about 6 months ago. Her smoking use included Cigarettes. She has a 60 pack-year smoking history. She has never used smokeless tobacco. She reports that she does not drink alcohol or use illicit  drugs.  Allergies:  Allergies  Allergen Reactions  . Tramadol     Per Dr. Threasa Beards is renal failure  . Protonix [Pantoprazole Sodium] Other (See Comments)    Hair loss    Medications: {medication reviewed  Results for orders placed during the hospital encounter of 05/18/13 (from the past 48 hour(s))  CBC WITH DIFFERENTIAL     Status: Abnormal   Collection Time    05/18/13  7:29 AM      Result Value Range   WBC 10.3  4.0 - 10.5 K/uL   RBC 3.54 (*) 3.87 - 5.11 MIL/uL   Hemoglobin 10.1 (*) 12.0 - 15.0 g/dL   HCT 16.1 (*) 09.6 - 04.5 %   MCV 88.7  78.0 - 100.0 fL   MCH 28.5  26.0 - 34.0 pg   MCHC 32.2   30.0 - 36.0 g/dL   RDW 40.9  81.1 - 91.4 %   Platelets 407 (*) 150 - 400 K/uL   Neutrophils Relative % 72  43 - 77 %   Neutro Abs 7.4  1.7 - 7.7 K/uL   Lymphocytes Relative 13  12 - 46 %   Lymphs Abs 1.3  0.7 - 4.0 K/uL   Monocytes Relative 12  3 - 12 %   Monocytes Absolute 1.3 (*) 0.1 - 1.0 K/uL   Eosinophils Relative 2  0 - 5 %   Eosinophils Absolute 0.3  0.0 - 0.7 K/uL   Basophils Relative 0  0 - 1 %   Basophils Absolute 0.0  0.0 - 0.1 K/uL  POCT I-STAT, CHEM 8     Status: Abnormal   Collection Time    05/18/13  7:52 AM      Result Value Range   Sodium 139  135 - 145 mEq/L   Potassium 4.2  3.5 - 5.1 mEq/L   Chloride 104  96 - 112 mEq/L   BUN 10  6 - 23 mg/dL   Creatinine, Ser 7.82  0.50 - 1.10 mg/dL   Glucose, Bld 956 (*) 70 - 99 mg/dL   Calcium, Ion 2.13  0.86 - 1.23 mmol/L   TCO2 25  0 - 100 mmol/L   Hemoglobin 10.5 (*) 12.0 - 15.0 g/dL   HCT 57.8 (*) 46.9 - 62.9 %    No results found.  Review of Systems  Constitutional: Negative for fever and chills.  Gastrointestinal: Positive for nausea and abdominal pain. Negative for vomiting, diarrhea, constipation and blood in stool.  Neurological: Negative for weakness.   Blood pressure 113/64, pulse 76, temperature 97.3 F (36.3 C), temperature source Oral, resp. rate 18, last menstrual period 04/20/2013, SpO2 94.00%. Physical Exam  Cardiovascular: Normal rate, regular rhythm, normal heart sounds and intact distal pulses.  Exam reveals no gallop and no friction rub.   No murmur heard. Respiratory: Breath sounds normal.  GI: Soft. Bowel sounds are normal. She exhibits no distension and no mass. There is tenderness. There is no guarding.  Previous ostomy site-beefy red, no drainage, packed with guaze. Midline incision, superior aspect of the incision is non tender and without erythema with approximated edges.  Infraumbilical aspect of the incision with mild superficial erythema.  approximately 1 cm area was opened with  serosanguinous output, no purulent drainage.  She did no tolerate well and attempts to further open were unsuccessful despite premedicating with morphine.      Assessment/Plan: S/p colostomy takedown 05/03/13 Approximately 20ml of serosanguinous output when the wound was opened.  There is no evidence  of an abscess.  Will cover with augmentin for the cellulitis.  Mother to pack area BID, NS wet to dry which she has been doing for the stoma site.  We discussed warning signs that warrant ED versus calling our office.  Oxycodone 10-15mg  q4h, avoid NSAIDs.  Scheduled to see Dr. Luisa Hart on 05/25/13 at10am Encouraged to call with any questions or concerns.  Ashok Norris ANP-BC Pager [phone]  05/18/2013, 9:37 AM

## 2013-05-18 NOTE — ED Provider Notes (Signed)
CSN: [id number]     Arrival date & time 05/18/13  3086 History   First MD Initiated Contact with Patient 05/18/13 [id number]     Chief Complaint  Patient presents with  . Post-op Problem   (Consider location/radiation/quality/duration/timing/severity/associated sxs/prior Treatment) HPI  51 year old obese female presents complaining of bleeding from colostomy reversal site performed on 05-03-13 by Dr. Luisa Hart.  Pt who underwent a sigmoid colectomy in 2011 secondary to perforated sigmoid diverticulitis and has had an end-colostomy.  She underwent a take down of her colostomy.  Her post-op stay was unremarkable.  She was discharged with home-health care.  Patient states this morning she noticed bloody discharge from her surgical incision.  Discharge was moderate and soaked her adult diaper.  She continues to have drainage from her JP drain, but drainage has not increased.  Her abdominal pain is 8/10 and has been the same since discharge from hospital.  No associated fever, n/v/d, cp, sob.  She is scheduled to be seen by Dr. Luisa Hart at 11am today for regular follow up.       Past Medical History  Diagnosis Date  . Diverticulitis   . Fibromyalgia   . Restless leg syndrome     Bilateral  . Asthma   . Bronchitis, chronic     Effects worse with URI  . Kidney stones     at least 6 stones in past  . Headache(784.0)     migraine headache  . Acid reflux disease   . Anxiety   . COPD (chronic obstructive pulmonary disease)   . Fibromyalgia   . GERD (gastroesophageal reflux disease)   . Diabetes mellitus without complication     borderline  . Vomiting     last night at 2300 after last round of antibiotic   Past Surgical History  Procedure Laterality Date  . Colostomy    . Appendectomy    . Cholecystectomy    . Partial hysterectomy      partial  . Colostomy closure N/A 11/01/2012    Procedure: COLOSTOMY CLOSURE;  Surgeon: Clovis Pu. Cornett, MD;  Location: [facility];  Service: General;  Laterality:  N/A;  REPAIR PARASTOMAL HERNIA; REVISION OF COLOSTOMY  . Laparotomy  11/01/2012    Procedure: EXPLORATORY LAPAROTOMY;  Surgeon: Clovis Pu. Cornett, MD;  Location: [facility];  Service: General;;  exploratory laparotomy,repair of parastomal hernia and incisional hernia, lysis of adhesions, and application of wound V.A.C.  . Parastomal hernia repair N/A 11/01/2012    Procedure: HERNIA REPAIR PARASTOMAL;  Surgeon: Clovis Pu. Cornett, MD;  Location: [facility];  Service: General;  Laterality: N/A;  . Incisional hernia repair N/A 11/01/2012    Procedure: HERNIA REPAIR INCISIONAL;  Surgeon: Clovis Pu. Cornett, MD;  Location: [facility];  Service: General;  Laterality: N/A;  . Lysis of adhesion N/A 11/01/2012    Procedure: LYSIS OF ADHESION;  Surgeon: Clovis Pu. Cornett, MD;  Location: [facility];  Service: General;  Laterality: N/A;  . Application of wound vac N/A 11/01/2012    Procedure: APPLICATION OF WOUND VAC;  Surgeon: Clovis Pu. Cornett, MD;  Location: [facility];  Service: General;  Laterality: N/A;  . Esophagogastroduodenoscopy Left 11/27/2012    Procedure: ESOPHAGOGASTRODUODENOSCOPY (EGD);  Surgeon: Vertell Novak., MD;  Location: [facility];  Service: Gastroenterology;  Laterality: Left;  . Abdominal hysterectomy    . Colostomy closure N/A 05/03/2013    Procedure: COLOSTOMY CLOSURE;  Surgeon: Clovis Pu. Cornett, MD;  Location: [facility];  Service: General;  Laterality: N/A;  Family History  Problem Relation Age of Onset  . Deep vein thrombosis Neg Hx   . Pulmonary embolism Neg Hx   . Diabetes Mother    History  Substance Use Topics  . Smoking status: Former Smoker -- 2.00 packs/day for 30 years    Types: Cigarettes    Quit date: 10/31/2012  . Smokeless tobacco: Never Used  . Alcohol Use: No   OB History   Grav Para Term Preterm Abortions TAB SAB Ect Mult Living                 Review of Systems  Constitutional: Negative for fever.  Gastrointestinal: Positive for abdominal pain.  Skin: Positive for rash.   All other systems reviewed and are negative.    Allergies  Tramadol and Protonix  Home Medications   Current Outpatient Rx  Name  Route  Sig  Dispense  Refill  . amitriptyline (ELAVIL) 100 MG tablet   Oral   Take 100 mg by mouth at bedtime.         . diazepam (VALIUM) 10 MG tablet   Oral   Take 10 mg by mouth every 8 (eight) hours as needed for anxiety.         Marland Kitchen esomeprazole (NEXIUM) 40 MG capsule   Oral   Take 40 mg by mouth daily before breakfast.         . fentaNYL (DURAGESIC - DOSED MCG/HR) 50 MCG/HR   Transdermal   Place 1 patch (50 mcg total) onto the skin every 3 (three) days.   5 patch   0   . ondansetron (ZOFRAN ODT) 4 MG disintegrating tablet   Oral   Take 1 tablet (4 mg total) by mouth every 6 (six) hours as needed for nausea.   15 tablet   0   . oxyCODONE 10 MG TABS   Oral   Take 1 tablet (10 mg total) by mouth every 4 (four) hours as needed.   30 tablet   0   . polyethylene glycol (MIRALAX / GLYCOLAX) packet   Oral   Take 17 g by mouth daily.   14 each   0    BP 109/67  Pulse 85  Temp(Src) 97.3 F (36.3 C) (Oral)  Resp 18  SpO2 95%  LMP 04/20/2013 Physical Exam  Nursing note and vitals reviewed. Constitutional: She appears well-developed and well-nourished. No distress.  Morbidly obese, nontoxic in appearance  HENT:  Mouth/Throat: Oropharynx is clear and moist.  Eyes: Conjunctivae are normal.  Neck: Neck supple.  Cardiovascular: Normal rate and regular rhythm.   Pulmonary/Chest:  Decreased breath sounds bilateral lower base without rales, rhonchi, or wheezes heard.  Abdominal: Soft.  Mid abdominal surgical incision with serosanguineous discharge noted at the base of the incision. The surrounding skin erythema    ED Course  Procedures (including critical care time)  9:00 AM Patient has serosanguineous discharge which were noted at the inferior aspects of the surgical site. Surrounding skin erythema which may suggest early  cellulitis. No evidence of dehiscence. No increasing pain on palpation. Normal amount of discharge to the JP drain. Patient has no fever with normal stable vital signs. Her labs are at baseline. I have consult [facility], who agrees to see patient in the ED for further evaluation.  11:14 AM [facility] has evaluate pt.  Staples from wound has been removed, and wound was packed by [facility] nurse practitioner.  Pt will be discharge with abx and pain meds per NP.  Pt will f/u outpt in 1 week for further care.    11:39 AM Despite multiple attempts to get in contact with NP that has previously seen pt to verify the pain medication and abx that she prescribed we were unable to have a return call.  Pt request to leave.  Therefore, will prescribe keflex and oxycodone as treatment.    Labs Review Labs Reviewed  CBC WITH DIFFERENTIAL - Abnormal; Notable for the following:    RBC 3.54 (*)    Hemoglobin 10.1 (*)    HCT 31.4 (*)    Platelets 407 (*)    Monocytes Absolute 1.3 (*)    All other components within normal limits  POCT I-STAT, CHEM 8 - Abnormal; Notable for the following:    Glucose, Bld 167 (*)    Hemoglobin 10.5 (*)    HCT 31.0 (*)    All other components within normal limits   Imaging Review No results found.  MDM   1. Wound disruption, post-op, skin, initial encounter    BP 101/59  Pulse 92  Temp(Src) 97.3 F (36.3 C) (Oral)  Resp 20  SpO2 93%  LMP 04/20/2013     Fayrene Helper, PA-C 05/18/13 1141

## 2013-05-18 NOTE — ED Notes (Signed)
Pt given a Coke. Awaiting general surgery phone call.

## 2013-05-22 ENCOUNTER — Telehealth (INDEPENDENT_AMBULATORY_CARE_PROVIDER_SITE_OTHER): Payer: Self-pay

## 2013-05-22 NOTE — Telephone Encounter (Signed)
Beth w [facility] is asking for once a week dressing change home visit, mother is changing dressing the other times.Fax [phone] phone [phone] . Patient is also having nausea which Zofran makes her more nauseous

## 2013-05-23 ENCOUNTER — Telehealth (INDEPENDENT_AMBULATORY_CARE_PROVIDER_SITE_OTHER): Payer: Self-pay

## 2013-05-23 DIAGNOSIS — R112 Nausea with vomiting, unspecified: Secondary | ICD-10-CM

## 2013-05-23 DIAGNOSIS — Z9889 Other specified postprocedural states: Secondary | ICD-10-CM

## 2013-05-23 MED ORDER — PROMETHAZINE HCL 12.5 MG PO TABS: 12.5000 mg | ORAL_TABLET | Freq: Four times a day (QID) | ORAL | Status: DC | PRN

## 2013-05-23 NOTE — Telephone Encounter (Signed)
Patient called back and was given below message.

## 2013-05-23 NOTE — Telephone Encounter (Signed)
Continuation of note below patient can take phenergan . Is it ok to order RX ?

## 2013-05-23 NOTE — Telephone Encounter (Signed)
yes

## 2013-05-23 NOTE — Telephone Encounter (Signed)
Lmom. prescription for phenergan sent to pharmacy.

## 2013-05-23 NOTE — Telephone Encounter (Signed)
LMOM with Beth> Ok for weekly dressing changes per Dr Luisa Hart. LMOM with Jolene> Phenergan sent to pharmacy.

## 2013-05-25 ENCOUNTER — Ambulatory Visit (INDEPENDENT_AMBULATORY_CARE_PROVIDER_SITE_OTHER): Payer: 59 | Admitting: Surgery

## 2013-05-25 ENCOUNTER — Encounter (INDEPENDENT_AMBULATORY_CARE_PROVIDER_SITE_OTHER): Payer: Self-pay | Admitting: Surgery

## 2013-05-25 VITALS — BP 128/82 | HR 88 | Temp 98.2°F | Resp 15 | Ht 65.0 in | Wt 233.4 lb

## 2013-05-25 DIAGNOSIS — R112 Nausea with vomiting, unspecified: Secondary | ICD-10-CM

## 2013-05-25 DIAGNOSIS — IMO0001 Reserved for inherently not codable concepts without codable children: Secondary | ICD-10-CM

## 2013-05-25 DIAGNOSIS — Z9889 Other specified postprocedural states: Secondary | ICD-10-CM

## 2013-05-25 DIAGNOSIS — Z5189 Encounter for other specified aftercare: Secondary | ICD-10-CM

## 2013-05-25 DIAGNOSIS — T8149XA Infection following a procedure, other surgical site, initial encounter: Secondary | ICD-10-CM | POA: Insufficient documentation

## 2013-05-25 MED ORDER — FENTANYL 75 MCG/HR TD PT72: 1.0000 | MEDICATED_PATCH | TRANSDERMAL | Status: DC

## 2013-05-25 MED ORDER — OXYCODONE HCL 10 MG PO TABS: 10.0000 mg | ORAL_TABLET | Freq: Four times a day (QID) | ORAL | Status: DC | PRN

## 2013-05-25 MED ORDER — AMOXICILLIN-POT CLAVULANATE 875-125 MG PO TABS: 1.0000 | ORAL_TABLET | Freq: Two times a day (BID) | ORAL | Status: DC

## 2013-05-25 NOTE — Progress Notes (Signed)
Patient returns after colostomy closure. She is now 3 and half weeks out. She was seen last Friday due to some drainage from the inferior portion of her wound this was opened which showed an old seroma. She still is chronic pain issues secondary to her fibromyalgia incisions. She's been on Augmentin. She is moving her bowels. JP drainage is serous.  Exam: JP drain shows serous fluid was removed today. Staples removed from incision. Packing removed from bottom of incision in this area is very clean and not repack. Old colostomy site packing removed and cleaned. No erythema. Very tender to setup and laid down.  Impression: 3 and half weeks out from colostomy closure and repair of parastomal hernia with wound cerumen drain week ago  Plan: Patient is making slow progress. Continue wound care to old ostomy site and changed once a day. 2 not repack lower part of incision. Refill pain medicine since she is a high threshold for this. We'll change to 75 mcg Duragesic patch. Change every 3 days. Refill oxycodone for her. 20 tablets given at 10 mg. Return in 2-3 weeks.

## 2013-05-25 NOTE — Patient Instructions (Signed)
Change dressing change to once a day and stop packing lower part of incision.

## 2013-05-29 ENCOUNTER — Telehealth (INDEPENDENT_AMBULATORY_CARE_PROVIDER_SITE_OTHER): Payer: Self-pay

## 2013-05-29 NOTE — Telephone Encounter (Signed)
Packing was removed.  Ostomy site wet to dry

## 2013-05-29 NOTE — Telephone Encounter (Signed)
[name] with [facility] called requesting specific orders for wd care since wound was reopened. I advised her I will send request to Dr Luisa Hart and Marcelino Duster to review and advise. [name] can be reached at [phone].

## 2013-05-29 NOTE — Telephone Encounter (Signed)
Called [name] with verbal order wet to dry dressing at ostomy site and dry guaze over abdominal wound.

## 2013-06-04 ENCOUNTER — Ambulatory Visit (INDEPENDENT_AMBULATORY_CARE_PROVIDER_SITE_OTHER): Payer: 59 | Admitting: Surgery

## 2013-06-04 ENCOUNTER — Encounter (INDEPENDENT_AMBULATORY_CARE_PROVIDER_SITE_OTHER): Payer: Self-pay | Admitting: Surgery

## 2013-06-04 VITALS — BP 142/70 | HR 84 | Temp 97.4°F | Resp 20 | Ht 65.0 in | Wt 234.0 lb

## 2013-06-04 DIAGNOSIS — Z9889 Other specified postprocedural states: Secondary | ICD-10-CM

## 2013-06-04 NOTE — Patient Instructions (Signed)
Return 1 month.  No lifting  More than 10 lbs. Finish antibiotics.

## 2013-06-04 NOTE — Progress Notes (Signed)
Patient returns after colostomy closure. She is now 4 weeks out. . She still is chronic pain issues secondary to her fibromyalgia and  incisions. She's been on Augmentin. She is moving her bowels.  Exam:  Old colostomy site packing removed and cleaned. No erythema. Very tender to setup and laid down.  Wound otherwise intact.   Impression: 4 weeks out from colostomy closure and repair of parastomal hernia with wound disruption   Plan: Patient is making slow progress. Continue wound care to old ostomy site and changed once a day. Return in 4  Weeks. No heavy lifting yet.

## 2013-06-11 ENCOUNTER — Telehealth (INDEPENDENT_AMBULATORY_CARE_PROVIDER_SITE_OTHER): Payer: Self-pay | Admitting: *Deleted

## 2013-06-11 DIAGNOSIS — G8918 Other acute postprocedural pain: Secondary | ICD-10-CM

## 2013-06-11 NOTE — Telephone Encounter (Signed)
Kaitleen with [facility] called this morning as a FYI to states that when she called to setup for them to go out to see patient today patient refused.  Patient reported to them that she had nausea, vomiting, and diarrhea all night and doesn't feel up to having them come out today.  Donnamarie Rossetti reports they will try to go out tomorrow if patient will allow them too.  Patient reported to them she is going to try her Phenergan.  Donnamarie Rossetti states she encouraged patient to stay hydrated.

## 2013-06-12 MED ORDER — OXYCODONE HCL 10 MG PO TABS: 10.0000 mg | ORAL_TABLET | Freq: Four times a day (QID) | ORAL | Status: DC | PRN

## 2013-06-12 NOTE — Telephone Encounter (Signed)
Beth with [facility] called to state she did go out to see the patient this morning and all wounds look good.  Patient does report she has been vomiting for the last 24 hours due to a virus patient believes.  Beth stated that patient was asking about getting a refill on her pain medication.  Explained that Dr. Luisa Hart would have to approve this refill so a message would be sent to him to ask then we can let the patient know.  Beth states understanding and agreeable with the plan at this time.  Documentation shows that patient received Oxycodone 10mg  #20 on 05/25/13 being her last refill.  Awaiting response from Dr. Luisa Hart at this time.

## 2013-06-12 NOTE — Telephone Encounter (Signed)
Script written by Dr Jamey Ripa and put up front for pick up. Pt aware.

## 2013-06-12 NOTE — Addendum Note (Signed)
Addended by: Brennan Bailey on: 06/12/2013 03:05 PM   Modules accepted: Orders

## 2013-06-12 NOTE — Telephone Encounter (Signed)
Ok to refill 

## 2013-06-18 ENCOUNTER — Encounter (INDEPENDENT_AMBULATORY_CARE_PROVIDER_SITE_OTHER): Payer: Self-pay

## 2013-06-19 ENCOUNTER — Encounter (INDEPENDENT_AMBULATORY_CARE_PROVIDER_SITE_OTHER): Payer: Self-pay

## 2013-07-02 ENCOUNTER — Ambulatory Visit (INDEPENDENT_AMBULATORY_CARE_PROVIDER_SITE_OTHER): Payer: 59 | Admitting: Surgery

## 2013-07-02 ENCOUNTER — Encounter (INDEPENDENT_AMBULATORY_CARE_PROVIDER_SITE_OTHER): Payer: Self-pay | Admitting: Surgery

## 2013-07-02 VITALS — BP 140/78 | HR 88 | Resp 14 | Ht 64.0 in | Wt 228.2 lb

## 2013-07-02 DIAGNOSIS — Z9889 Other specified postprocedural states: Secondary | ICD-10-CM

## 2013-07-02 NOTE — Patient Instructions (Signed)
NO LIFTING MORE THAN 15 LBS FOR 1 YEAR.  RETURN 3 MONTHS.

## 2013-07-02 NOTE — Progress Notes (Signed)
Patient returns after colostomy closure. She is now 8 weeks out. . She still is chronic pain issues secondary to her fibromyalgia and  incisions. Had recent viral infections. She is moving her bowels.  Exam:  Old colostomy healed  No erythema. Very tender to setup and laid down.  Wound otherwise intact.  No hernias.  Large panus.   Impression: 8 weeks out from colostomy closure and repair of parastomal hernia with wound disruption   Plan: Patient is making slow progress. . Return in 12  Weeks. No heavy lifting for 1 year.  High hernia risk.

## 2013-07-23 ENCOUNTER — Encounter (INDEPENDENT_AMBULATORY_CARE_PROVIDER_SITE_OTHER): Payer: Self-pay | Admitting: Surgery

## 2013-07-23 ENCOUNTER — Ambulatory Visit (INDEPENDENT_AMBULATORY_CARE_PROVIDER_SITE_OTHER): Payer: 59 | Admitting: Surgery

## 2013-07-23 VITALS — BP 158/90 | HR 82 | Temp 98.2°F | Resp 18 | Ht 65.0 in | Wt 225.4 lb

## 2013-07-23 DIAGNOSIS — G8918 Other acute postprocedural pain: Secondary | ICD-10-CM

## 2013-07-23 MED ORDER — AMOXICILLIN-POT CLAVULANATE 875-125 MG PO TABS: 1.0000 | ORAL_TABLET | Freq: Two times a day (BID) | ORAL | Status: DC

## 2013-07-23 NOTE — Patient Instructions (Signed)
Return 1 month.  Take antibiotics until gone.

## 2013-07-23 NOTE — Progress Notes (Signed)
Patient returns to have her wound checked. She states has been draining in the midline. No fever or chills. Pain is improved.  Exam: Hard not just the right a lower midline incision. Under sterile conditions this was incised and fat necrosis noted. No pus. 1% lidocaine was used for local anesthesia.  Impression: Status post colostomy closure with fat necrosis  Plan: Cover with dry dressing and let close up. Return in 2 weeks.

## 2013-08-20 ENCOUNTER — Ambulatory Visit (INDEPENDENT_AMBULATORY_CARE_PROVIDER_SITE_OTHER): Payer: 59 | Admitting: Surgery

## 2013-08-20 ENCOUNTER — Encounter (INDEPENDENT_AMBULATORY_CARE_PROVIDER_SITE_OTHER): Payer: Self-pay | Admitting: Surgery

## 2013-08-20 VITALS — BP 138/86 | HR 84 | Temp 97.7°F | Resp 20 | Ht 65.0 in | Wt 233.6 lb

## 2013-08-20 DIAGNOSIS — IMO0001 Reserved for inherently not codable concepts without codable children: Secondary | ICD-10-CM

## 2013-08-20 DIAGNOSIS — Z5189 Encounter for other specified aftercare: Secondary | ICD-10-CM

## 2013-08-20 MED ORDER — DOXYCYCLINE HYCLATE 50 MG PO CAPS: 50.0000 mg | ORAL_CAPSULE | Freq: Two times a day (BID) | ORAL | Status: DC

## 2013-08-20 NOTE — Patient Instructions (Signed)
Take antibiotics for 1 month twice a day.  Return next month.

## 2013-08-20 NOTE — Progress Notes (Signed)
Subjective:     Patient ID: [name], female   DOB: Apr 10, 1961, 53 y.o.   MRN: [id number]  HPI  Patient returns 4 weeks after colostomy closure due to  history of diverticulitis. She had a small abscess drained from her lower incision one month ago. She has had recurrent abscess cc in this area in the past. This may be from fat necrosis. She's doing a lot better with her pain. She is in good spirits. The area were the incision and drainage took place is still sore but not red or draining. Review of Systems  Respiratory: Negative.   Cardiovascular: Negative.   Gastrointestinal: Negative.        Objective:   Physical Exam  Constitutional: She is oriented to person, place, and time. She appears well-developed and well-nourished.  Abdominal:    Neurological: She is alert and oriented to person, place, and time.  Skin: Skin is warm.  Psychiatric: She has a normal mood and affect. Her behavior is normal. Thought content normal.       Assessment:     History of diverticulitis  History of colostomy closure  History of chronic abdominal abscess    Plan:     Will place on doxycycline 50 mg by mouth twice a day for one month due to history of chronic abdominal wall abscesses. This is probably secondary to fat necrosis. May need debridement at some point but she is doing a lot better overall. Return to clinic next month. High risk of incisional hernia

## 2013-09-24 ENCOUNTER — Encounter (INDEPENDENT_AMBULATORY_CARE_PROVIDER_SITE_OTHER): Payer: Self-pay | Admitting: Surgery

## 2013-09-24 ENCOUNTER — Ambulatory Visit (INDEPENDENT_AMBULATORY_CARE_PROVIDER_SITE_OTHER): Payer: 59 | Admitting: Surgery

## 2013-09-24 VITALS — BP 130/72 | HR 82 | Temp 98.0°F | Resp 18 | Ht 65.0 in | Wt 237.0 lb

## 2013-09-24 DIAGNOSIS — G8918 Other acute postprocedural pain: Secondary | ICD-10-CM

## 2013-09-24 DIAGNOSIS — Z9889 Other specified postprocedural states: Secondary | ICD-10-CM

## 2013-09-24 NOTE — Patient Instructions (Signed)
Call with issues 

## 2013-09-25 NOTE — Progress Notes (Signed)
Pt returns for follow up after colostomy closure 2 months ago.  Doing better.  Bowels moving.  Still has some tenderness around small open area incision.   Exam:  Well healed incisions.  Soft abdomen  Non distended.  1 cm open area lateral to umbilicus noted.   Overall doing well RTC as needed No further ABX at this point for incision.

## 2014-01-18 ENCOUNTER — Telehealth (INDEPENDENT_AMBULATORY_CARE_PROVIDER_SITE_OTHER): Payer: Self-pay | Admitting: *Deleted

## 2014-01-18 ENCOUNTER — Ambulatory Visit (INDEPENDENT_AMBULATORY_CARE_PROVIDER_SITE_OTHER): Payer: 59 | Admitting: Surgery

## 2014-01-18 ENCOUNTER — Encounter (INDEPENDENT_AMBULATORY_CARE_PROVIDER_SITE_OTHER): Payer: Self-pay | Admitting: Surgery

## 2014-01-18 VITALS — BP 126/80 | HR 80 | Temp 97.6°F | Resp 14 | Ht 65.0 in | Wt 235.2 lb

## 2014-01-18 DIAGNOSIS — R1032 Left lower quadrant pain: Secondary | ICD-10-CM

## 2014-01-18 NOTE — Progress Notes (Signed)
Subjective:     Patient ID: [name], female   DOB: 12/01/1960, 53 y.o.   MRN: [id number]  HPI Patient returns for follow up of abdominal pain. She is a complex surgical history with a closure of a parastomal hernia and takedown colostomy year ago. She's been having more gas pain left lower quadrant pain after eating. Bowels are moving. She's losing weight he was diagnosed with uncontrolled diabetes per your this year though. No nausea or vomiting.  Review of Systems  Respiratory: Negative.   Cardiovascular: Negative.   Gastrointestinal: Positive for abdominal pain and abdominal distention.       Objective:   Physical Exam  Constitutional: She appears well-developed and well-nourished.  Abdominal:    Skin: Skin is warm and dry.  Psychiatric: She has a normal mood and affect. Her behavior is normal. Judgment and thought content normal.       Assessment:     Abdominal pain with history of diverticulitis with multiple abdominal operations    Plan:     Recommend CT scan for further evaluation. Decreased fiber intake to reduce bloating. Simethicone for bloating. Further recommendations wants testing done.

## 2014-01-18 NOTE — Telephone Encounter (Signed)
Pt returned call and was given all instructions.  Understands and will attend appt.

## 2014-01-18 NOTE — Patient Instructions (Signed)
Try Beano or gas x with meal.  Be careful with your fiber intake.  Continue weight loss and diabetes management.

## 2014-01-18 NOTE — Telephone Encounter (Signed)
LM for pt to return my call.  Please advise her of the apt for the CT Abdomen Pelvis @ [facility] [address] 01-23-14 arrive at 2:45  To start drinking the 1st bottle of contrast at 12:45 and second bottle at 1:45.  No solid foods 4 hours prior to the test.  Thanks!  Anderson Malta

## 2014-01-22 LAB — COMPREHENSIVE METABOLIC PANEL
ALT: <8 U/L (ref 0–35)
AST: 14 U/L (ref 0–37)
Albumin: 3.9 g/dL (ref 3.5–5.2)
Alkaline Phosphatase: 87 U/L (ref 39–117)
BUN: 17 mg/dL (ref 6–23)
CO2: 22 mEq/L (ref 19–32)
Calcium: 9.4 mg/dL (ref 8.4–10.5)
Chloride: 107 mEq/L (ref 96–112)
Creat: 0.7 mg/dL (ref 0.50–1.10)
Glucose, Bld: 90 mg/dL (ref 70–99)
Potassium: 4.7 mEq/L (ref 3.5–5.3)
Sodium: 141 mEq/L (ref 135–145)
Total Bilirubin: 0.3 mg/dL (ref 0.2–1.2)
Total Protein: 6.7 g/dL (ref 6.0–8.3)

## 2014-01-23 ENCOUNTER — Ambulatory Visit
Admission: RE | Admit: 2014-01-23 | Discharge: 2014-01-23 | Disposition: A | Discharge status: Home / Self Care | Payer: 59 | Source: Ambulatory Visit | Attending: Surgery | Admitting: Surgery

## 2014-01-23 DIAGNOSIS — R1032 Left lower quadrant pain: Secondary | ICD-10-CM

## 2014-01-25 ENCOUNTER — Telehealth (INDEPENDENT_AMBULATORY_CARE_PROVIDER_SITE_OTHER): Payer: Self-pay

## 2014-01-25 NOTE — Telephone Encounter (Signed)
LMOM> Ct shows incisional hernia that is likely causing her discomfort. Needs to make f/u appt to discuss surgery options.

## 2014-01-25 NOTE — Telephone Encounter (Signed)
Pt called back.  I relayed the message below and will schedule her a f/u appt.  Anderson Malta

## 2014-01-25 NOTE — Telephone Encounter (Signed)
Message copied by Carlene Coria on Fri Jan 25, 2014  1:50 PM ------      Message from: Erroll Luna A      Created: Fri Jan 25, 2014  9:32 AM       Incisional hernia probably causing pain.  Needs appt to talk about repair ------

## 2014-02-04 ENCOUNTER — Ambulatory Visit (INDEPENDENT_AMBULATORY_CARE_PROVIDER_SITE_OTHER): Payer: 59 | Admitting: Surgery

## 2014-02-04 ENCOUNTER — Encounter (INDEPENDENT_AMBULATORY_CARE_PROVIDER_SITE_OTHER): Payer: Self-pay | Admitting: Surgery

## 2014-02-04 VITALS — BP 124/80 | HR 90 | Temp 97.6°F | Ht 65.0 in | Wt 234.0 lb

## 2014-02-04 DIAGNOSIS — K432 Incisional hernia without obstruction or gangrene: Secondary | ICD-10-CM | POA: Insufficient documentation

## 2014-02-04 NOTE — Patient Instructions (Signed)
Return next month to set up surgery

## 2014-02-04 NOTE — Progress Notes (Signed)
Subjective:     Patient ID: [name], female   DOB: 04/02/1961, 52 y.o.   MRN: [id number]  HPI Patient returns for follow up of abdominal pain. She is a complex surgical history with a closure of a parastomal hernia and takedown colostomy year ago. She's been having more gas pain left lower quadrant pain after eating. Bowels are moving. She's losing weight he was diagnosed with uncontrolled diabetes per your this year though. No nausea or vomiting.Patient returns after CT scan which shows an incisional hernia that is not incarcerated. No evidence of obstruction.  Review of Systems  Respiratory: Negative.   Cardiovascular: Negative.   Gastrointestinal: Positive for abdominal pain and abdominal distention.       Objective:   Physical Exam  Constitutional: She appears well-developed and well-nourished.  Abdominal:    Skin: Skin is warm and dry.  Psychiatric: She has a normal mood and affect. Her behavior is normal. Judgment and thought content normal.       Assessment:     Abdominal pain with history of diverticulitis with multiple abdominal operations Incisional hernia reducible    Plan:     Recommend Repair of incisional hernia with mesh. Discussed both laparoscopic and open. Risk of conversion to open procedure is about 50%. Discussed open repair of mesh and laparoscopic repair. She would like to proceed with laparoscopic approach understanding the potential need for conversion.The risk of hernia repair include bleeding,  Infection,   Recurrence of the hernia,  Mesh use, chronic pain,  Organ injury,  Bowel injury,  Bladder injury,   nerve injury with numbness around the incision,  Death,  and worsening of preexisting  medical problems.  The alternatives to surgery have been discussed as well..  Long term expectations of both operative and non operative treatments have been discussed.   The patient agrees to proceed.      

## 2014-03-07 ENCOUNTER — Encounter (INDEPENDENT_AMBULATORY_CARE_PROVIDER_SITE_OTHER): Payer: Self-pay | Admitting: Surgery

## 2014-03-07 ENCOUNTER — Ambulatory Visit (INDEPENDENT_AMBULATORY_CARE_PROVIDER_SITE_OTHER): Payer: 59 | Admitting: Surgery

## 2014-03-07 VITALS — BP 125/65 | HR 71 | Temp 97.9°F | Resp 16 | Ht 65.0 in | Wt 239.2 lb

## 2014-03-07 DIAGNOSIS — K432 Incisional hernia without obstruction or gangrene: Secondary | ICD-10-CM

## 2014-03-07 NOTE — Progress Notes (Signed)
Subjective:     Patient ID: [name], female   DOB: [birth year], 53 y.o.   MRN: [id number]  HPI Patient returns for follow up of abdominal pain. She is a complex surgical history with a closure of a parastomal hernia and takedown colostomy year ago. She's been having more gas pain left lower quadrant pain after eating. Bowels are moving. She's losing weight he was diagnosed with uncontrolled diabetes per your this year though. No nausea or vomiting.Patient returns after CT scan which shows an incisional hernia that is not incarcerated. No evidence of obstruction.  Review of Systems  Respiratory: Negative.   Cardiovascular: Negative.   Gastrointestinal: Positive for abdominal pain and abdominal distention.       Objective:   Physical Exam  Constitutional: She appears well-developed and well-nourished.  Abdominal:    Skin: Skin is warm and dry.  Psychiatric: She has a normal mood and affect. Her behavior is normal. Judgment and thought content normal.       Assessment:     Abdominal pain with history of diverticulitis with multiple abdominal operations Incisional hernia reducible    Plan:     Recommend Repair of incisional hernia with mesh. Discussed both laparoscopic and open. Risk of conversion to open procedure is about 50%. Discussed open repair of mesh and laparoscopic repair. She would like to proceed with laparoscopic approach understanding the potential need for conversion.The risk of hernia repair include bleeding,  Infection,   Recurrence of the hernia,  Mesh use, chronic pain,  Organ injury,  Bowel injury,  Bladder injury,   nerve injury with numbness around the incision,  Death,  and worsening of preexisting  medical problems.  The alternatives to surgery have been discussed as well..  Long term expectations of both operative and non operative treatments have been discussed.   The patient agrees to proceed.

## 2014-03-07 NOTE — Patient Instructions (Signed)
Hernia Repair with Laparoscope A hernia occurs when an internal organ pushes out through a weak spot in the belly (abdominal) wall muscles. Hernias most commonly occur in the groin and around the navel. Hernias can also occur through a cut by the surgeon (incision) after an abdominal operation. A hernia may be caused by:  Lifting heavy objects.  Prolonged coughing.  Straining to move your bowels. Hernias can often be pushed back into place (reduced). Most hernias tend to get worse over time. Problems occur when abdominal contents get stuck in the opening and the blood supply is blocked or impaired (incarcerated hernia). Because of these risks, you require surgery to repair the hernia. Your hernia will be repaired using a laparoscope. Laparoscopic surgery is a type of minimally invasive surgery. It does not involve making a typical surgical cut (incision) in the skin. A laparoscope is a telescope-like rod and lens system. It is usually connected to a video camera and a light source so your caregiver can clearly see the operative area. The instruments are inserted through  to  inch (5 mm or 10 mm) openings in the skin at specific locations. A working and viewing space is created by blowing a small amount of carbon dioxide gas into the abdominal cavity. The abdomen is essentially blown up like a balloon (insufflated). This elevates the abdominal wall above the internal organs like a dome. The carbon dioxide gas is common to the human body and can be absorbed by tissue and removed by the respiratory system. Once the repair is completed, the small incisions will be closed with either stitches (sutures) or staples (just like a paper stapler only this staple holds the skin together). LET YOUR CAREGIVERS KNOW ABOUT:  Allergies.  Medications taken including herbs, eye drops, over the counter medications, and creams.  Use of steroids (by mouth or creams).  Previous problems with anesthetics or  Novocaine.  Possibility of pregnancy, if this applies.  History of blood clots (thrombophlebitis).  History of bleeding or blood problems.  Previous surgery.  Other health problems. BEFORE THE PROCEDURE  Laparoscopy can be done either in a hospital or out-patient clinic. You may be given a mild sedative to help you relax before the procedure. Once in the operating room, you will be given a general anesthesia to make you sleep (unless you and your caregiver choose a different anesthetic).  AFTER THE PROCEDURE  After the procedure you will be watched in a recovery area. Depending on what type of hernia was repaired, you might be admitted to the hospital or you might go home the same day. With this procedure you may have less pain and scarring. This usually results in a quicker recovery and less risk of infection. HOME CARE INSTRUCTIONS   Bed rest is not required. You may continue your normal activities but avoid heavy lifting (more than 10 pounds) or straining.  Cough gently. If you are a smoker it is best to stop, as even the best hernia repair can break down with the continual strain of coughing.  Avoid driving until given the OK by your surgeon.  There are no dietary restrictions unless given otherwise.  TAKE ALL MEDICATIONS AS DIRECTED.  Only take over-the-counter or prescription medicines for pain, discomfort, or fever as directed by your caregiver. SEEK MEDICAL CARE IF:   There is increasing abdominal pain or pain in your incisions.  There is more bleeding from incisions, other than minimal spotting.  You feel light headed or faint.  You   develop an unexplained fever, chills, and/or an oral temperature above 102° F (38.9° C). °· You have redness, swelling, or increasing pain in the wound. °· Pus coming from wound. °· A foul smell coming from the wound or dressings. °SEEK IMMEDIATE MEDICAL CARE IF:  °· You develop a rash. °· You have difficulty breathing. °· You have any  allergic problems. °MAKE SURE YOU:  °· Understand these instructions. °· Will watch your condition. °· Will get help right away if you are not doing well or get worse. °Document Released: 09/06/2005 Document Revised: 11/29/2011 Document Reviewed: 08/06/2009 °[employer]® Patient Information ©2015 [employer]. This information is not intended to replace advice given to you by your health care provider. Make sure you discuss any questions you have with your health care provider. ° ° °

## 2014-04-04 ENCOUNTER — Telehealth (INDEPENDENT_AMBULATORY_CARE_PROVIDER_SITE_OTHER): Payer: Self-pay | Admitting: Surgery

## 2014-04-04 NOTE — Telephone Encounter (Signed)
Left patient voice mail to call back on 03/25/14 @ 554pm never heard back from patient.

## 2014-05-13 ENCOUNTER — Encounter (INDEPENDENT_AMBULATORY_CARE_PROVIDER_SITE_OTHER): Payer: Self-pay | Admitting: Surgery

## 2014-05-13 ENCOUNTER — Ambulatory Visit (INDEPENDENT_AMBULATORY_CARE_PROVIDER_SITE_OTHER): Payer: 59 | Admitting: Surgery

## 2014-05-13 VITALS — BP 130/76 | HR 81 | Ht 65.0 in | Wt 247.0 lb

## 2014-05-13 DIAGNOSIS — K432 Incisional hernia without obstruction or gangrene: Secondary | ICD-10-CM

## 2014-05-13 NOTE — Patient Instructions (Signed)
Hernia Repair with Laparoscope A hernia occurs when an internal organ pushes out through a weak spot in the belly (abdominal) wall muscles. Hernias most commonly occur in the groin and around the navel. Hernias can also occur through a cut by the surgeon (incision) after an abdominal operation. A hernia may be caused by:  Lifting heavy objects.  Prolonged coughing.  Straining to move your bowels. Hernias can often be pushed back into place (reduced). Most hernias tend to get worse over time. Problems occur when abdominal contents get stuck in the opening and the blood supply is blocked or impaired (incarcerated hernia). Because of these risks, you require surgery to repair the hernia. Your hernia will be repaired using a laparoscope. Laparoscopic surgery is a type of minimally invasive surgery. It does not involve making a typical surgical cut (incision) in the skin. A laparoscope is a telescope-like rod and lens system. It is usually connected to a video camera and a light source so your caregiver can clearly see the operative area. The instruments are inserted through  to  inch (5 mm or 10 mm) openings in the skin at specific locations. A working and viewing space is created by blowing a small amount of carbon dioxide gas into the abdominal cavity. The abdomen is essentially blown up like a balloon (insufflated). This elevates the abdominal wall above the internal organs like a dome. The carbon dioxide gas is common to the human body and can be absorbed by tissue and removed by the respiratory system. Once the repair is completed, the small incisions will be closed with either stitches (sutures) or staples (just like a paper stapler only this staple holds the skin together). LET YOUR CAREGIVERS KNOW ABOUT:  Allergies.  Medications taken including herbs, eye drops, over the counter medications, and creams.  Use of steroids (by mouth or creams).  Previous problems with anesthetics or  Novocaine.  Possibility of pregnancy, if this applies.  History of blood clots (thrombophlebitis).  History of bleeding or blood problems.  Previous surgery.  Other health problems. BEFORE THE PROCEDURE  Laparoscopy can be done either in a hospital or out-patient clinic. You may be given a mild sedative to help you relax before the procedure. Once in the operating room, you will be given a general anesthesia to make you sleep (unless you and your caregiver choose a different anesthetic).  AFTER THE PROCEDURE  After the procedure you will be watched in a recovery area. Depending on what type of hernia was repaired, you might be admitted to the hospital or you might go home the same day. With this procedure you may have less pain and scarring. This usually results in a quicker recovery and less risk of infection. HOME CARE INSTRUCTIONS   Bed rest is not required. You may continue your normal activities but avoid heavy lifting (more than 10 pounds) or straining.  Cough gently. If you are a smoker it is best to stop, as even the best hernia repair can break down with the continual strain of coughing.  Avoid driving until given the OK by your surgeon.  There are no dietary restrictions unless given otherwise.  TAKE ALL MEDICATIONS AS DIRECTED.  Only take over-the-counter or prescription medicines for pain, discomfort, or fever as directed by your caregiver. SEEK MEDICAL CARE IF:   There is increasing abdominal pain or pain in your incisions.  There is more bleeding from incisions, other than minimal spotting.  You feel light headed or faint.  You   develop an unexplained fever, chills, and/or an oral temperature above 102° F (38.9° C). °· You have redness, swelling, or increasing pain in the wound. °· Pus coming from wound. °· A foul smell coming from the wound or dressings. °SEEK IMMEDIATE MEDICAL CARE IF:  °· You develop a rash. °· You have difficulty breathing. °· You have any  allergic problems. °MAKE SURE YOU:  °· Understand these instructions. °· Will watch your condition. °· Will get help right away if you are not doing well or get worse. °Document Released: 09/06/2005 Document Revised: 11/29/2011 Document Reviewed: 08/06/2009 °[employer]® Patient Information ©2015 [employer]. This information is not intended to replace advice given to you by your health care provider. Make sure you discuss any questions you have with your health care provider. ° ° °

## 2014-05-13 NOTE — Progress Notes (Signed)
Subjective:     Patient ID: [name], female   DOB: 1961/05/09, 53 y.o.   MRN: [id number]  HPI Patient returns for follow up of abdominal pain. She is a complex surgical history with a closure of a parastomal hernia and takedown colostomy year ago. She's been having more gas pain left lower quadrant pain after eating. Bowels are moving. She's losing weight he was diagnosed with uncontrolled diabetes per your this year though. No nausea or vomiting.Patient returns after CT scan which shows an incisional hernia that is not incarcerated. No evidence of obstruction.  Review of Systems  Respiratory: Negative.   Cardiovascular: Negative.   Gastrointestinal: Positive for abdominal pain and abdominal distention.       Objective:   Physical Exam  Constitutional: She appears well-developed and well-nourished.  Abdominal:    Skin: Skin is warm and dry.  Psychiatric: She has a normal mood and affect. Her behavior is normal. Judgment and thought content normal.       Assessment:     Abdominal pain with history of diverticulitis with multiple abdominal operations Incisional hernia reducible    Plan:     Recommend Repair of incisional hernia with mesh. Discussed both laparoscopic and open. Risk of conversion to open procedure is about 50%. Discussed open repair of mesh and laparoscopic repair. She would like to proceed with laparoscopic approach understanding the potential need for conversion.The risk of hernia repair include bleeding,  Infection,   Recurrence of the hernia,  Mesh use, chronic pain,  Organ injury,  Bowel injury,  Bladder injury,   nerve injury with numbness around the incision,  Death,  and worsening of preexisting  medical problems.  The alternatives to surgery have been discussed as well..  Long term expectations of both operative and non operative treatments have been discussed.   The patient agrees to proceed.

## 2014-07-04 ENCOUNTER — Inpatient Hospital Stay (HOSPITAL_COMMUNITY): Admission: RE | Admit: 2014-07-04 | Payer: 59 | Source: Ambulatory Visit

## 2014-08-12 ENCOUNTER — Inpatient Hospital Stay (HOSPITAL_COMMUNITY): Admission: RE | Admit: 2014-08-12 | Payer: 59 | Source: Ambulatory Visit

## 2014-08-21 ENCOUNTER — Encounter (HOSPITAL_COMMUNITY): Admission: RE | Payer: Self-pay | Source: Ambulatory Visit

## 2014-08-21 ENCOUNTER — Inpatient Hospital Stay (HOSPITAL_COMMUNITY): Admission: RE | Admit: 2014-08-21 | Payer: 59 | Source: Ambulatory Visit | Admitting: Surgery

## 2014-08-21 SURGERY — REPAIR, HERNIA, INCISIONAL, LAPAROSCOPIC
Anesthesia: General

## 2015-02-10 ENCOUNTER — Ambulatory Visit: Payer: Self-pay

## 2015-07-01 ENCOUNTER — Emergency Department (HOSPITAL_COMMUNITY)
Admission: EM | Admit: 2015-07-01 | Discharge: 2015-07-02 | Disposition: A | Discharge status: Other Acute Inpatient Hospital | Payer: 59 | Attending: Emergency Medicine | Admitting: Emergency Medicine

## 2015-07-01 ENCOUNTER — Encounter (HOSPITAL_COMMUNITY): Payer: Self-pay | Admitting: Emergency Medicine

## 2015-07-01 DIAGNOSIS — W260XXA Contact with knife, initial encounter: Secondary | ICD-10-CM | POA: Insufficient documentation

## 2015-07-01 DIAGNOSIS — J449 Chronic obstructive pulmonary disease, unspecified: Secondary | ICD-10-CM | POA: Diagnosis not present

## 2015-07-01 DIAGNOSIS — T404X2A Poisoning by other synthetic narcotics, intentional self-harm, initial encounter: Secondary | ICD-10-CM | POA: Diagnosis not present

## 2015-07-01 DIAGNOSIS — Z87442 Personal history of urinary calculi: Secondary | ICD-10-CM | POA: Insufficient documentation

## 2015-07-01 DIAGNOSIS — F1310 Sedative, hypnotic or anxiolytic abuse, uncomplicated: Secondary | ICD-10-CM | POA: Insufficient documentation

## 2015-07-01 DIAGNOSIS — Z7289 Other problems related to lifestyle: Secondary | ICD-10-CM

## 2015-07-01 DIAGNOSIS — Z87891 Personal history of nicotine dependence: Secondary | ICD-10-CM | POA: Insufficient documentation

## 2015-07-01 DIAGNOSIS — Y9389 Activity, other specified: Secondary | ICD-10-CM | POA: Diagnosis not present

## 2015-07-01 DIAGNOSIS — T6592XA Toxic effect of unspecified substance, intentional self-harm, initial encounter: Secondary | ICD-10-CM

## 2015-07-01 DIAGNOSIS — Z23 Encounter for immunization: Secondary | ICD-10-CM | POA: Insufficient documentation

## 2015-07-01 DIAGNOSIS — X58XXXA Exposure to other specified factors, initial encounter: Secondary | ICD-10-CM | POA: Insufficient documentation

## 2015-07-01 DIAGNOSIS — Z046 Encounter for general psychiatric examination, requested by authority: Secondary | ICD-10-CM

## 2015-07-01 DIAGNOSIS — Y998 Other external cause status: Secondary | ICD-10-CM | POA: Diagnosis not present

## 2015-07-01 DIAGNOSIS — Y9289 Other specified places as the place of occurrence of the external cause: Secondary | ICD-10-CM | POA: Diagnosis not present

## 2015-07-01 DIAGNOSIS — Z79899 Other long term (current) drug therapy: Secondary | ICD-10-CM | POA: Diagnosis not present

## 2015-07-01 DIAGNOSIS — T424X2A Poisoning by benzodiazepines, intentional self-harm, initial encounter: Secondary | ICD-10-CM | POA: Diagnosis not present

## 2015-07-01 DIAGNOSIS — F419 Anxiety disorder, unspecified: Secondary | ICD-10-CM | POA: Insufficient documentation

## 2015-07-01 DIAGNOSIS — T1491 Suicide attempt: Secondary | ICD-10-CM | POA: Diagnosis present

## 2015-07-01 DIAGNOSIS — K219 Gastro-esophageal reflux disease without esophagitis: Secondary | ICD-10-CM | POA: Diagnosis not present

## 2015-07-01 HISTORY — DX: Major depressive disorder, single episode, unspecified: F32.9

## 2015-07-01 HISTORY — DX: Depression, unspecified: F32.A

## 2015-07-01 LAB — RAPID URINE DRUG SCREEN, HOSP PERFORMED
Amphetamines: NOT DETECTED
Barbiturates: NOT DETECTED
Benzodiazepines: POSITIVE — AB
Cocaine: NOT DETECTED
Opiates: NOT DETECTED
Tetrahydrocannabinol: NOT DETECTED

## 2015-07-01 LAB — ETHANOL: Alcohol, Ethyl (B): <5 mg/dL (ref <5)

## 2015-07-01 LAB — COMPREHENSIVE METABOLIC PANEL
ALT: 6 U/L — ABNORMAL LOW (ref 14–54)
AST: 20 U/L (ref 15–41)
Albumin: 4.2 g/dL (ref 3.5–5.0)
Alkaline Phosphatase: 96 U/L (ref 38–126)
Anion gap: 11 (ref 5–15)
BUN: 15 mg/dL (ref 6–20)
CO2: 24 mmol/L (ref 22–32)
Calcium: 9.7 mg/dL (ref 8.9–10.3)
Chloride: 107 mmol/L (ref 101–111)
Creatinine, Ser: 0.95 mg/dL (ref 0.44–1.00)
GFR calc Af Amer: >60 mL/min (ref >60)
GFR calc non Af Amer: >60 mL/min (ref >60)
Glucose, Bld: 99 mg/dL (ref 65–99)
Potassium: 4 mmol/L (ref 3.5–5.1)
Sodium: 142 mmol/L (ref 135–145)
Total Bilirubin: 0.6 mg/dL (ref 0.3–1.2)
Total Protein: 7.6 g/dL (ref 6.5–8.1)

## 2015-07-01 LAB — CBC
HCT: 48.4 % — ABNORMAL HIGH (ref 36.0–46.0)
Hemoglobin: 16.1 g/dL — ABNORMAL HIGH (ref 12.0–15.0)
MCH: 29.5 pg (ref 26.0–34.0)
MCHC: 33.3 g/dL (ref 30.0–36.0)
MCV: 88.6 fL (ref 78.0–100.0)
Platelets: 273 10*3/uL (ref 150–400)
RBC: 5.46 MIL/uL — ABNORMAL HIGH (ref 3.87–5.11)
RDW: 14 % (ref 11.5–15.5)
WBC: 10.8 10*3/uL — ABNORMAL HIGH (ref 4.0–10.5)

## 2015-07-01 LAB — SALICYLATE LEVEL: Salicylate Lvl: 11 mg/dL (ref 2.8–30.0)

## 2015-07-01 LAB — ACETAMINOPHEN LEVEL: Acetaminophen (Tylenol), Serum: <10 ug/mL — ABNORMAL LOW (ref 10–30)

## 2015-07-01 MED ORDER — BACITRACIN ZINC 500 UNIT/GM EX OINT
TOPICAL_OINTMENT | Freq: Two times a day (BID) | CUTANEOUS | Status: DC
  Administered 2015-07-01: 21:00:00 via TOPICAL
  Filled 2015-07-01: qty 1.8

## 2015-07-01 MED ORDER — MOMETASONE FURO-FORMOTEROL FUM 100-5 MCG/ACT IN AERO
2.0000 | INHALATION_SPRAY | Freq: Two times a day (BID) | RESPIRATORY_TRACT | Status: DC
  Administered 2015-07-02: 2 via RESPIRATORY_TRACT
  Filled 2015-07-01: qty 8.8

## 2015-07-01 MED ORDER — ALBUTEROL SULFATE HFA 108 (90 BASE) MCG/ACT IN AERS: 2.0000 | INHALATION_SPRAY | Freq: Four times a day (QID) | RESPIRATORY_TRACT | Status: DC | PRN

## 2015-07-01 MED ORDER — LORAZEPAM 1 MG PO TABS: 1.0000 mg | ORAL_TABLET | Freq: Three times a day (TID) | ORAL | Status: DC | PRN

## 2015-07-01 MED ORDER — INSULIN ASPART 100 UNIT/ML ~~LOC~~ SOLN
0.0000 [IU] | SUBCUTANEOUS | Status: DC
  Administered 2015-07-02: 2 [IU] via SUBCUTANEOUS
  Administered 2015-07-02: 3 [IU] via SUBCUTANEOUS
  Filled 2015-07-01 (×2): qty 1

## 2015-07-01 MED ORDER — ATORVASTATIN CALCIUM 40 MG PO TABS
40.0000 mg | ORAL_TABLET | Freq: Every day | ORAL | Status: DC
  Administered 2015-07-02: 40 mg via ORAL
  Filled 2015-07-01 (×2): qty 1

## 2015-07-01 MED ORDER — PANTOPRAZOLE SODIUM 40 MG PO TBEC: 40.0000 mg | DELAYED_RELEASE_TABLET | Freq: Every day | ORAL | Status: DC

## 2015-07-01 MED ORDER — FAMOTIDINE 20 MG PO TABS
10.0000 mg | ORAL_TABLET | Freq: Every day | ORAL | Status: DC
  Administered 2015-07-02: 10 mg via ORAL
  Filled 2015-07-01: qty 1

## 2015-07-01 MED ORDER — ONDANSETRON HCL 4 MG PO TABS: 4.0000 mg | ORAL_TABLET | Freq: Three times a day (TID) | ORAL | Status: DC | PRN

## 2015-07-01 MED ORDER — METFORMIN HCL 500 MG PO TABS
1000.0000 mg | ORAL_TABLET | Freq: Two times a day (BID) | ORAL | Status: DC
  Administered 2015-07-02: 1000 mg via ORAL
  Filled 2015-07-01 (×3): qty 2

## 2015-07-01 MED ORDER — TETANUS-DIPHTH-ACELL PERTUSSIS 5-2.5-18.5 LF-MCG/0.5 IM SUSP
0.5000 mL | Freq: Once | INTRAMUSCULAR | Status: AC
  Administered 2015-07-01: 0.5 mL via INTRAMUSCULAR
  Filled 2015-07-01: qty 0.5

## 2015-07-01 MED ORDER — ZOLPIDEM TARTRATE 5 MG PO TABS: 5.0000 mg | ORAL_TABLET | Freq: Every evening | ORAL | Status: DC | PRN

## 2015-07-01 MED ORDER — SODIUM CHLORIDE 0.9 % IV BOLUS (SEPSIS)
1000.0000 mL | Freq: Once | INTRAVENOUS | Status: AC
  Administered 2015-07-01: 1000 mL via INTRAVENOUS

## 2015-07-01 MED ORDER — NICOTINE 21 MG/24HR TD PT24
21.0000 mg | MEDICATED_PATCH | Freq: Every day | TRANSDERMAL | Status: DC
  Filled 2015-07-01: qty 1

## 2015-07-01 MED ORDER — ACETAMINOPHEN 325 MG PO TABS
650.0000 mg | ORAL_TABLET | ORAL | Status: DC | PRN
  Filled 2015-07-01: qty 2

## 2015-07-01 MED ORDER — FAMOTIDINE IN NACL 20-0.9 MG/50ML-% IV SOLN
20.0000 mg | Freq: Once | INTRAVENOUS | Status: AC
  Administered 2015-07-01: 20 mg via INTRAVENOUS
  Filled 2015-07-01: qty 50

## 2015-07-01 MED ORDER — ALUM & MAG HYDROXIDE-SIMETH 200-200-20 MG/5ML PO SUSP: 30.0000 mL | ORAL | Status: DC | PRN

## 2015-07-01 MED ORDER — IBUPROFEN 200 MG PO TABS: 600.0000 mg | ORAL_TABLET | Freq: Three times a day (TID) | ORAL | Status: DC | PRN

## 2015-07-01 NOTE — ED Notes (Signed)
Bed: WA23 Expected date:  Expected time:  Means of arrival:  Comments: EMS-OD 

## 2015-07-01 NOTE — ED Notes (Signed)
Per EMS-patient took 30 Valium and 10 tramadol in an attempt to harm self-also has superficial lacerations to both forearms-
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
## 2015-07-01 NOTE — ED Notes (Signed)
Pt's husband at bedside. Pt openly discussing how she plans to attempt hurting herself when she leaves here. Pt states that if we take away her phone she will "kill herself even if she has to hang herself with a sheet". Despite this, pt's husband is helpful in providing support and redirecting patient.

## 2015-07-01 NOTE — ED Notes (Signed)
Bed: RC16 Expected date:  Expected time:  Means of arrival:  Comments: Switching rooms with 23

## 2015-07-01 NOTE — ED Notes (Signed)
Patient states a lot of situational stressors-husband lost his job, she is unable to work due to medical conditions-feels helpless

## 2015-07-01 NOTE — ED Notes (Signed)
Per poison control, salicylate level needs to be repeated. As soon as this level is 0, pt is cleared by poison control

## 2015-07-01 NOTE — ED Notes (Signed)
Pt was taken to the restroom and asked to provide sample of urine in a hat. Pt was able to urinate but before sample could be collected to send, specimen was thrown out. Will reassess pt urine needs soon again to see if we can get another sample to send for collection

## 2015-07-02 LAB — CBG MONITORING, ED
Glucose-Capillary: 114 mg/dL — ABNORMAL HIGH (ref 65–99)
Glucose-Capillary: 152 mg/dL — ABNORMAL HIGH (ref 65–99)
Glucose-Capillary: 192 mg/dL — ABNORMAL HIGH (ref 65–99)

## 2015-07-02 LAB — SALICYLATE LEVEL: Salicylate Lvl: 5.3 mg/dL (ref 2.8–30.0)

## 2015-07-02 NOTE — BH Assessment (Addendum)
Tele Assessment Note   [name] is a 54 y.o. female who presents via IVC petition, initiated by her sister.  Pt overdosed on approx 30 valium, 10 tramadol and 15 "goody powders" in an attempt to harm self.  Pt reported that she has been thinking about SI for several months and her thoughts worsened in the last 2 days.  Pt admits self harming behaviors(cutter) since march 2015 and says this is the worse it's been, she usually cuts "here and there" to relieve her stress.  Pt has several visible superficial lacerations on both arms that she completed prior to coming to the emerg dept.  She says she has stressors that "pushed me to my limit today"--(1) disabled ; (2) worsening health problems; (3) financial issues; (4) spouse lost his job; (5) marital discord and (6) fighting for Fish farm manager.  When this writer asked about SI/HI, pt replied "no comment" and "do I have to answer that", stating that she sometimes wants to hit her husband but has not thoughts of fatally hurting him--" he calls my name 100x's a day and I want to knock him the head and hit him".  Pt denies SA/AVH.  Pt admits that she has "rage issues" due to health problems and marital issues and was encouraged by her family physician to seek help.  Upon arrival at the [facility], pt.'s husband was at her bedside and she was openly discussing her plans to hurt herself and stated that if the medical staff removed her cell phone from her she would "kill herself even if she has to herself with a sheet and admitted she'd tried to kill herself by cutting both arms with a knife. This Probation officer discussed disposition with Patriciaann Clan, PA who recommends inpt admission.  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 Diverticulitis   . Fibromyalgia   . Restless leg syndrome     Bilateral  . Asthma   . Bronchitis, chronic (HCC)     Effects worse with URI  . Kidney stones     at least 6 stones in past  . Headache(784.0)     migraine headache  . Acid reflux disease   . Anxiety   . COPD (chronic obstructive pulmonary disease) ([place])   . Fibromyalgia   . GERD (gastroesophageal reflux disease)   . Diabetes mellitus without complication (HCC)     borderline  . Vomiting     last night at 2300 after last round of antibiotic  . Depression     Past Surgical History  Procedure Laterality Date  . Colostomy    . Appendectomy    .  Cholecystectomy    . Partial hysterectomy      partial  . Colostomy closure N/A 11/01/2012    Procedure: COLOSTOMY CLOSURE;  Surgeon: Joyice Faster. Cornett, MD;  Location: [facility];  Service: General;  Laterality: N/A;  REPAIR PARASTOMAL HERNIA; REVISION OF COLOSTOMY  . Laparotomy  11/01/2012    Procedure: EXPLORATORY LAPAROTOMY;  Surgeon: Joyice Faster. Cornett, MD;  Location: [facility];  Service: General;;  exploratory laparotomy,repair of parastomal hernia and incisional hernia, lysis of adhesions, and application of wound V.A.C.  . Parastomal hernia repair N/A 11/01/2012    Procedure: HERNIA REPAIR PARASTOMAL;  Surgeon: Joyice Faster. Cornett, MD;  [facility];  Service: General;  Laterality: N/A;  . Incisional hernia repair N/A 11/01/2012    Procedure: HERNIA REPAIR INCISIONAL;  Surgeon: Joyice Faster. Cornett, MD;  [facility];  Service: General;  Laterality: N/A;  . Lysis of adhesion N/A 11/01/2012    Procedure: LYSIS OF ADHESION;  Surgeon: Joyice Faster. Cornett, MD;  [facility];  Service: General;  Laterality: N/A;  . Application of wound vac N/A 11/01/2012    Procedure: APPLICATION OF WOUND VAC;  Surgeon: Joyice Faster.  Cornett, MD;  [facility];  Service: General;  Laterality: N/A;  . Esophagogastroduodenoscopy Left 11/27/2012    Procedure: ESOPHAGOGASTRODUODENOSCOPY (EGD);  Surgeon: Winfield Cunas., MD;  Location: [facility];  Service: Gastroenterology;  Laterality: Left;  . Abdominal hysterectomy    . Colostomy closure N/A 05/03/2013    Procedure: COLOSTOMY CLOSURE;  Surgeon: Joyice Faster. Cornett, MD;  Location: [facility];  Service: General;  Laterality: N/A;    Family History:  Family History  Problem Relation Age of Onset  . Deep vein thrombosis Neg Hx   . Pulmonary embolism Neg Hx   . Diabetes Mother     Social History:  reports that she quit smoking about 2 years ago. Her smoking use included Cigarettes. She has a 60 pack-year smoking history. She has never used smokeless tobacco. She reports that she does not drink alcohol or use illicit drugs.  Additional Social History:  Alcohol / Drug Use Pain Medications: See MAR  Prescriptions: See MAR  Over the Counter: See MAR  History of alcohol / drug use?: No history of alcohol / drug abuse Longest period of sobriety (when/how long): None   CIWA: CIWA-Ar BP: 118/55 mmHg Pulse Rate: 75 COWS:    PATIENT STRENGTHS: (choose at least two) Communication skills Supportive family/friends  Allergies:  Allergies  Allergen Reactions  . Tramadol     Per Dr. Sharyon Medicus is renal failure  . Protonix [Pantoprazole Sodium] Other (See Comments)    Hair loss    Home Medications:  (Not in a hospital admission)  OB/GYN Status:  Patient's last menstrual period was 04/20/2013.  General Assessment Data [facility]: [facility] T[facility] Assessment: In system Is this a Tele or Face-to-Face Assessment?: Tele Assessment Is this an Initial Assessment or a Re-assessment for this encounter?: Initial Assessment Marital status: Married Donaldson name: Unk  Is patient pregnant?: No Pregnancy Status: No Living Arrangements: Spouse/significant  other Can pt return to current living arrangement?: Yes Admission Status: Involuntary Is patient capable of signing voluntary admission?: No Referral Source: MD Insurance type: [place] Screening Exam ([place]) Medical Exam completed: No Reason for MSE not completed: Other: (None )  Crisis Care Plan Living Arrangements: Spouse/significant other Name of Psychiatrist: None  Name of Therapist: None   Education Status Is patient currently in school?:  No Current Grade: None  Highest grade of school patient has completed: None  Name of school: None  Contact person: None   Risk to self with the past 6 months Suicidal Ideation: Yes-Currently Present Has patient been a risk to self within the past 6 months prior to admission? : Yes Suicidal Intent: Yes-Currently Present Has patient had any suicidal intent within the past 6 months prior to admission? : Yes Is patient at risk for suicide?: Yes Suicidal Plan?: Yes-Currently Present Has patient had any suicidal plan within the past 6 months prior to admission? : Yes Specify Current Suicidal Plan: Overdose on medications  Access to Means: Yes Specify Access to Suicidal Means: Sharps, Pills  What has been your use of drugs/alcohol within the last 12 months?: Pt denies  Previous Attempts/Gestures: No How many times?: 0 Other Self Harm Risks: Cutting since 11/2013 Triggers for Past Attempts: None known Intentional Self Injurious Behavior: Cutting Comment - Self Injurious Behavior: Cutting behaviors since 11/2013 Family Suicide History: No Recent stressful life event(s): Conflict (Comment), Job Loss, Financial Problems, Recent negative physical changes (Pls see EPIC notes ) Persecutory voices/beliefs?: Yes Depression: Yes Depression Symptoms: Loss of interest in usual pleasures, Feeling angry/irritable, Feeling worthless/self pity, Insomnia, Tearfulness Substance abuse history and/or treatment for substance abuse?:  No Suicide prevention information given to non-admitted patients: Not applicable  Risk to Others within the past 6 months Homicidal Ideation: No Does patient have any lifetime risk of violence toward others beyond the six months prior to admission? : No Thoughts of Harm to Others: No Current Homicidal Intent: No Current Homicidal Plan: No Access to Homicidal Means: No Identified Victim: None  History of harm to others?: No Assessment of Violence: None Noted Violent Behavior Description: None  Does patient have access to weapons?: No Criminal Charges Pending?: No Does patient have a court date: No Is patient on probation?: No  Psychosis Hallucinations: None noted Delusions: None noted  Mental Status Report Appearance/Hygiene: Other (Comment) (Appropriate ) Eye Contact: Good Motor Activity: Unremarkable Speech: Logical/coherent Level of Consciousness: Alert Mood: Depressed, Sad, Helpless Affect: Depressed, Appropriate to circumstance, Sad Anxiety Level: Minimal Thought Processes: Coherent, Relevant Judgement: Impaired Orientation: Person, Place, Time, Situation Obsessive Compulsive Thoughts/Behaviors: None  Cognitive Functioning Concentration: Normal Memory: Recent Intact, Remote Intact IQ: Average Insight: Poor Impulse Control: Poor Appetite: Poor Weight Loss: 5 Weight Gain: 0 Sleep: Decreased Total Hours of Sleep: 2 Vegetative Symptoms: None  ADLScreening [facility] Assessment Services) Patient's cognitive ability adequate to safely complete daily activities?: Yes Patient able to express need for assistance with ADLs?: Yes Independently performs ADLs?: Yes (appropriate for developmental age)  Prior Inpatient Therapy Prior Inpatient Therapy: No Prior Therapy Dates: None  Prior Therapy Facilty/Provider(s): None  Reason for Treatment: None   Prior Outpatient Therapy Prior Outpatient Therapy: No Prior Therapy Dates: None  Prior Therapy Facilty/Provider(s): None   Reason for Treatment: None  Does patient have an ACCT team?: No Does patient have Intensive In-House Services?  : No Does patient have [facility] services? : No Does patient have P4CC services?: No  ADL Screening (condition at time of admission) Patient's cognitive ability adequate to safely complete daily activities?: Yes Is the patient deaf or have difficulty hearing?: No Does the patient have difficulty seeing, even when wearing glasses/contacts?: No Patient able to express need for assistance with ADLs?: Yes Does the patient have difficulty dressing or bathing?: No Independently performs ADLs?: Yes (appropriate for developmental age) Does the patient have difficulty walking or climbing stairs?: Yes (  Due to health problems, pt has limited mobility ) Weakness of Legs: Both (Due to medical problems, pt has limited mobility ) Weakness of Arms/Hands: None  Home Assistive Devices/Equipment Home Assistive Devices/Equipment: Eyeglasses  Therapy Consults (therapy consults require a physician order) PT Evaluation Needed: No OT Evalulation Needed: No SLP Evaluation Needed: No Abuse/Neglect Assessment (Assessment to be complete while patient is alone) Physical Abuse: Denies Verbal Abuse: Denies Sexual Abuse: Denies Exploitation of patient/patient's resources: Denies Self-Neglect: Denies Values / Beliefs Cultural Requests During Hospitalization: None Spiritual Requests During Hospitalization: None Consults Spiritual Care Consult Needed: No Social Work Consult Needed: No Regulatory affairs officer (For Healthcare) Does patient have an advance directive?: No Would patient like information on creating an advanced directive?: No - patient declined information    Additional Information 1:1 In Past 12 Months?: No CIRT Risk: No Elopement Risk: No Does patient have medical clearance?: Yes     Disposition:  Disposition Initial Assessment Completed for this Encounter: Yes Disposition of  Patient: Inpatient treatment program, Referred to (Per Patriciaann Clan, PA meets criteria for inpt admission ) Type of inpatient treatment program: Adult Patient referred to: Other (Comment) (Per Patriciaann Clan, PA meets criteria for inpt admission )  Girtha Rm 07/02/2015 12:05 AM

## 2015-07-02 NOTE — BHH Counselor (Addendum)
Pt accepted to [facility] per Flavia Shipper.  Pt can come after 9AM.  Attending Dr. Reece Levy.  Pt going to room [facility] B.  Call report to [phone].   Called to advise nurse [place].  Faylene Kurtz, MS, CRC, [place] Triage Specialist [facility]

## 2015-07-02 NOTE — ED Provider Notes (Signed)
EMTALA completed for transfer. Patient is resting quietly. She awakens to light voice. No respiratory distress. Patient denies any immediate complaints. She is made aware of transfer to [facility] for ongoing care.  Charlesetta Shanks, MD 07/02/15 [phone]

## 2015-07-02 NOTE — ED Notes (Signed)
Patient is involuntarily committed and does not have the form; EXAMINATION AND RECOMMENDATION TO DETERMINE NECESSITY FOR INVOLUNTARY COMMITMENT.

## 2015-11-18 ENCOUNTER — Emergency Department (HOSPITAL_COMMUNITY)
Admission: EM | Admit: 2015-11-18 | Discharge: 2015-11-18 | Disposition: A | Discharge status: Home / Self Care | Payer: BLUE CROSS/BLUE SHIELD | Attending: Emergency Medicine | Admitting: Emergency Medicine

## 2015-11-18 ENCOUNTER — Emergency Department (HOSPITAL_COMMUNITY): Payer: BLUE CROSS/BLUE SHIELD

## 2015-11-18 ENCOUNTER — Encounter (HOSPITAL_COMMUNITY): Payer: Self-pay | Admitting: Emergency Medicine

## 2015-11-18 DIAGNOSIS — G2581 Restless legs syndrome: Secondary | ICD-10-CM | POA: Insufficient documentation

## 2015-11-18 DIAGNOSIS — F419 Anxiety disorder, unspecified: Secondary | ICD-10-CM | POA: Diagnosis not present

## 2015-11-18 DIAGNOSIS — Z7951 Long term (current) use of inhaled steroids: Secondary | ICD-10-CM | POA: Diagnosis not present

## 2015-11-18 DIAGNOSIS — Z87891 Personal history of nicotine dependence: Secondary | ICD-10-CM | POA: Insufficient documentation

## 2015-11-18 DIAGNOSIS — Z7984 Long term (current) use of oral hypoglycemic drugs: Secondary | ICD-10-CM | POA: Insufficient documentation

## 2015-11-18 DIAGNOSIS — Y998 Other external cause status: Secondary | ICD-10-CM | POA: Diagnosis not present

## 2015-11-18 DIAGNOSIS — Z79899 Other long term (current) drug therapy: Secondary | ICD-10-CM | POA: Insufficient documentation

## 2015-11-18 DIAGNOSIS — M25461 Effusion, right knee: Secondary | ICD-10-CM | POA: Diagnosis not present

## 2015-11-18 DIAGNOSIS — M797 Fibromyalgia: Secondary | ICD-10-CM | POA: Insufficient documentation

## 2015-11-18 DIAGNOSIS — J449 Chronic obstructive pulmonary disease, unspecified: Secondary | ICD-10-CM | POA: Insufficient documentation

## 2015-11-18 DIAGNOSIS — F329 Major depressive disorder, single episode, unspecified: Secondary | ICD-10-CM | POA: Diagnosis not present

## 2015-11-18 DIAGNOSIS — W541XXA Struck by dog, initial encounter: Secondary | ICD-10-CM | POA: Diagnosis not present

## 2015-11-18 DIAGNOSIS — Z87442 Personal history of urinary calculi: Secondary | ICD-10-CM | POA: Insufficient documentation

## 2015-11-18 DIAGNOSIS — Y9389 Activity, other specified: Secondary | ICD-10-CM | POA: Insufficient documentation

## 2015-11-18 DIAGNOSIS — K219 Gastro-esophageal reflux disease without esophagitis: Secondary | ICD-10-CM | POA: Insufficient documentation

## 2015-11-18 DIAGNOSIS — S8991XA Unspecified injury of right lower leg, initial encounter: Secondary | ICD-10-CM | POA: Diagnosis present

## 2015-11-18 DIAGNOSIS — S8011XA Contusion of right lower leg, initial encounter: Secondary | ICD-10-CM | POA: Insufficient documentation

## 2015-11-18 DIAGNOSIS — Y9289 Other specified places as the place of occurrence of the external cause: Secondary | ICD-10-CM | POA: Diagnosis not present

## 2015-11-18 DIAGNOSIS — Z794 Long term (current) use of insulin: Secondary | ICD-10-CM | POA: Diagnosis not present

## 2015-11-18 MED ORDER — OXYCODONE-ACETAMINOPHEN 5-325 MG PO TABS
1.0000 | ORAL_TABLET | Freq: Once | ORAL | Status: AC
  Administered 2015-11-18: 1 via ORAL

## 2015-11-18 MED ORDER — OXYCODONE-ACETAMINOPHEN 5-325 MG PO TABS
ORAL_TABLET | ORAL | Status: AC
  Filled 2015-11-18: qty 1

## 2015-11-18 NOTE — ED Provider Notes (Signed)
CSN: [id number]     Arrival date & time 11/18/15  0520 History   First MD Initiated Contact with Patient 11/18/15 [phone]     Chief Complaint  Patient presents with  . Leg Injury     (Consider location/radiation/quality/duration/timing/severity/associated sxs/prior Treatment) HPI Comments: Pt comes in with c/o right lower leg pain that started yesterday after a pitbull ran into her leg. Denies numbness or weakness. States that the pain is in her lateral lower leg. No previous injury.Marland Kitchen Has been taking tramadol without relief.  The history is provided by the patient. No language interpreter was used.    Past Medical History  Diagnosis Date  . Diverticulitis   . Fibromyalgia   . Restless leg syndrome     Bilateral  . Asthma   . Bronchitis, chronic (HCC)     Effects worse with URI  . Kidney stones     at least 6 stones in past  . Headache(784.0)     migraine headache  . Acid reflux disease   . Anxiety   . COPD (chronic obstructive pulmonary disease) ([place])   . Fibromyalgia   . GERD (gastroesophageal reflux disease)   . Diabetes mellitus without complication (HCC)     borderline  . Vomiting     last night at 2300 after last round of antibiotic  . Depression    Past Surgical History  Procedure Laterality Date  . Colostomy    . Appendectomy    . Cholecystectomy    . Partial hysterectomy      partial  . Colostomy closure N/A 11/01/2012    Procedure: COLOSTOMY CLOSURE;  Surgeon: Joyice Faster. Cornett, MD;  [facility];  Service: General;  Laterality: N/A;  REPAIR PARASTOMAL HERNIA; REVISION OF COLOSTOMY  . Laparotomy  11/01/2012    Procedure: EXPLORATORY LAPAROTOMY;  Surgeon: Joyice Faster. Cornett, MD;  [facility];  Service: General;;  exploratory laparotomy,repair of parastomal hernia and incisional hernia, lysis of adhesions, and application of wound V.A.C.  . Parastomal hernia repair N/A 11/01/2012    Procedure: HERNIA REPAIR PARASTOMAL;  Surgeon: Joyice Faster. Cornett, MD;   [facility];  Service: General;  Laterality: N/A;  . Incisional hernia repair N/A 11/01/2012    Procedure: HERNIA REPAIR INCISIONAL;  Surgeon: Joyice Faster. Cornett, MD;  [facility];  Service: General;  Laterality: N/A;  . Lysis of adhesion N/A 11/01/2012    Procedure: LYSIS OF ADHESION;  Surgeon: Joyice Faster. Cornett, MD;  [facility];  Service: General;  Laterality: N/A;  . Application of wound vac N/A 11/01/2012    Procedure: APPLICATION OF WOUND VAC;  Surgeon: Joyice Faster. Cornett, MD;  [facility];  Service: General;  Laterality: N/A;  . Esophagogastroduodenoscopy Left 11/27/2012    Procedure: ESOPHAGOGASTRODUODENOSCOPY (EGD);  Surgeon: Winfield Cunas., MD;  Location: [facility];  Service: Gastroenterology;  Laterality: Left;  . Abdominal hysterectomy    . Colostomy closure N/A 05/03/2013    Procedure: COLOSTOMY CLOSURE;  Surgeon: Joyice Faster. Cornett, MD;  Location: [facility];  Service: General;  Laterality: N/A;   Family History  Problem Relation Age of Onset  . Deep vein thrombosis Neg Hx   . Pulmonary embolism Neg Hx   . Diabetes Mother    Social History  Substance Use Topics  . Smoking status: Former Smoker -- 2.00 packs/day for 30 years    Types: Cigarettes    Quit date: 10/31/2012  . Smokeless tobacco: Never Used  . Alcohol Use: No  OB History    No data available     Review of Systems  All other systems reviewed and are negative.     Allergies  Tramadol and Protonix  Home Medications   Prior to Admission medications   Medication Sig Start Date End Date Taking? Authorizing Provider  atorvastatin (LIPITOR) 40 MG tablet Take 40 mg by mouth daily at 6 PM.  01/17/14   Historical Provider, MD  diazepam (VALIUM) 10 MG tablet Take 10 mg by mouth every 8 (eight) hours as needed for anxiety.    Historical Provider, MD  esomeprazole (NEXIUM) 40 MG capsule Take 40 mg by mouth daily before breakfast.    Historical Provider, MD  Fluticasone-Salmeterol (ADVAIR)  100-50 MCG/DOSE AEPB Inhale 1 puff into the lungs 2 (two) times daily.    Historical Provider, MD  HUMALOG KWIKPEN 100 UNIT/ML KiwkPen Inject 5 Units into the skin daily.  11/07/13   Historical Provider, MD  LANTUS SOLOSTAR 100 UNIT/ML Solostar Pen Inject 32 Units into the skin daily at 10 pm.  01/03/14   Historical Provider, MD  metFORMIN (GLUCOPHAGE) 1000 MG tablet Take 1,000 mg by mouth 2 (two) times daily with a meal.  01/03/14   Historical Provider, MD  ranitidine (ZANTAC) 150 MG tablet Take 150 mg by mouth 3 (three) times daily.    Historical Provider, MD  traMADol (ULTRAM) 50 MG tablet TK 1 T PO Q 8 H PRN P 06/24/15   Historical Provider, MD  VENTOLIN HFA 108 (90 BASE) MCG/ACT inhaler Inhale 2 puffs into the lungs every 6 (six) hours as needed for wheezing or shortness of breath.  07/05/13   Historical Provider, MD   BP 131/74 mmHg  Pulse 115  Temp(Src) 97.5 F (36.4 C) (Oral)  Resp 22  Ht 5\' 6"  (1.676 m)  Wt 107.502 kg  BMI 38.27 kg/m2  SpO2 95%  LMP 04/20/2013 Physical Exam  Constitutional: She is oriented to person, place, and time. She appears well-developed and well-nourished.  Cardiovascular: Normal rate and regular rhythm.   Pulmonary/Chest: Effort normal and breath sounds normal.  Musculoskeletal:  Tender to the right lateral leg. Pulses intact. No swelling or deformity noted  Neurological: She is alert and oriented to person, place, and time. Coordination normal.  Skin: Skin is warm and dry.  Psychiatric: She has a normal mood and affect.  Nursing note and vitals reviewed.   ED Course  Procedures (including critical care time) Labs Review Labs Reviewed - No data to display  Imaging Review Dg Tibia/fibula Right  11/18/2015  CLINICAL DATA:  Dog ran into patient's right lower leg, with pain. Initial encounter. EXAM: RIGHT TIBIA AND FIBULA - 2 VIEW COMPARISON:  None. FINDINGS: The tibia and fibula are unremarkable in appearance. Visualized joint spaces are preserved. A  small knee joint effusion is noted. The ankle mortise is incompletely assessed, but appears grossly unremarkable. IMPRESSION: 1. No evidence of fracture or dislocation. 2. Small knee joint effusion noted. Electronically Signed   By: Garald Balding M.D.   On: 11/18/2015 06:07   I have personally reviewed and evaluated these images and lab results as part of my medical decision-making.   EKG Interpretation None      MDM   Final diagnoses:  Contusion of leg, right, initial encounter  Knee effusion, right    Noted on x-ray the knee effusion. Pt states that she can't have a immobilizer because it will make her fall:pt given crutches    Glendell Docker, NP 11/18/15 [phone]  Forde Dandy, MD 11/18/15 1932

## 2015-11-18 NOTE — Discharge Instructions (Signed)

## 2015-11-18 NOTE — ED Notes (Signed)
Patient here with complaint of right lower leg pain. States yesterday her 90lb pitbull was running at high speed and collided with her right leg. Explains that the dog struck her on the lateral aspect of that knee and knocked her down. Denies other injury.

## 2016-05-05 ENCOUNTER — Other Ambulatory Visit: Payer: Self-pay | Admitting: Internal Medicine

## 2016-05-05 DIAGNOSIS — Z1231 Encounter for screening mammogram for malignant neoplasm of breast: Secondary | ICD-10-CM

## 2017-01-25 DIAGNOSIS — R69 Illness, unspecified: Secondary | ICD-10-CM | POA: Diagnosis not present

## 2017-03-10 DIAGNOSIS — R69 Illness, unspecified: Secondary | ICD-10-CM | POA: Diagnosis not present

## 2017-03-21 DIAGNOSIS — R69 Illness, unspecified: Secondary | ICD-10-CM | POA: Diagnosis not present

## 2017-04-12 DIAGNOSIS — R69 Illness, unspecified: Secondary | ICD-10-CM | POA: Diagnosis not present

## 2017-04-20 DIAGNOSIS — R69 Illness, unspecified: Secondary | ICD-10-CM | POA: Diagnosis not present

## 2017-04-27 DIAGNOSIS — R8299 Other abnormal findings in urine: Secondary | ICD-10-CM | POA: Diagnosis not present

## 2017-04-27 DIAGNOSIS — I10 Essential (primary) hypertension: Secondary | ICD-10-CM | POA: Diagnosis not present

## 2017-04-27 DIAGNOSIS — E119 Type 2 diabetes mellitus without complications: Secondary | ICD-10-CM | POA: Diagnosis not present

## 2017-05-03 ENCOUNTER — Other Ambulatory Visit: Payer: Self-pay | Admitting: Internal Medicine

## 2017-05-03 DIAGNOSIS — I10 Essential (primary) hypertension: Secondary | ICD-10-CM | POA: Diagnosis not present

## 2017-05-03 DIAGNOSIS — E784 Other hyperlipidemia: Secondary | ICD-10-CM | POA: Diagnosis not present

## 2017-05-03 DIAGNOSIS — E119 Type 2 diabetes mellitus without complications: Secondary | ICD-10-CM | POA: Diagnosis not present

## 2017-05-03 DIAGNOSIS — J449 Chronic obstructive pulmonary disease, unspecified: Secondary | ICD-10-CM | POA: Diagnosis not present

## 2017-05-03 DIAGNOSIS — M797 Fibromyalgia: Secondary | ICD-10-CM | POA: Diagnosis not present

## 2017-05-03 DIAGNOSIS — E668 Other obesity: Secondary | ICD-10-CM | POA: Diagnosis not present

## 2017-05-03 DIAGNOSIS — F17211 Nicotine dependence, cigarettes, in remission: Secondary | ICD-10-CM

## 2017-05-03 DIAGNOSIS — Z0000 Encounter for general adult medical examination without abnormal findings: Secondary | ICD-10-CM | POA: Diagnosis not present

## 2017-05-03 DIAGNOSIS — E781 Pure hyperglyceridemia: Secondary | ICD-10-CM | POA: Diagnosis not present

## 2017-05-03 DIAGNOSIS — R69 Illness, unspecified: Secondary | ICD-10-CM | POA: Diagnosis not present

## 2017-05-09 ENCOUNTER — Inpatient Hospital Stay
Admission: RE | Admit: 2017-05-09 | Discharge: 2017-05-09 | Disposition: A | Discharge status: Home / Self Care | Payer: BLUE CROSS/BLUE SHIELD | Source: Ambulatory Visit | Attending: Internal Medicine | Admitting: Internal Medicine

## 2017-05-12 ENCOUNTER — Ambulatory Visit
Admission: RE | Admit: 2017-05-12 | Discharge: 2017-05-12 | Disposition: A | Discharge status: Home / Self Care | Payer: Medicare HMO | Source: Ambulatory Visit | Attending: Internal Medicine | Admitting: Internal Medicine

## 2017-05-12 DIAGNOSIS — Z1231 Encounter for screening mammogram for malignant neoplasm of breast: Secondary | ICD-10-CM | POA: Diagnosis not present

## 2017-05-12 DIAGNOSIS — Z1212 Encounter for screening for malignant neoplasm of rectum: Secondary | ICD-10-CM | POA: Diagnosis not present

## 2017-05-16 ENCOUNTER — Other Ambulatory Visit: Payer: Self-pay | Admitting: Internal Medicine

## 2017-05-16 DIAGNOSIS — R928 Other abnormal and inconclusive findings on diagnostic imaging of breast: Secondary | ICD-10-CM

## 2017-05-18 ENCOUNTER — Ambulatory Visit: Payer: Medicare HMO

## 2017-05-18 ENCOUNTER — Ambulatory Visit
Admission: RE | Admit: 2017-05-18 | Discharge: 2017-05-18 | Disposition: A | Discharge status: Home / Self Care | Payer: Medicare HMO | Source: Ambulatory Visit | Attending: Internal Medicine | Admitting: Internal Medicine

## 2017-05-18 ENCOUNTER — Other Ambulatory Visit: Payer: Self-pay | Admitting: Internal Medicine

## 2017-05-18 DIAGNOSIS — R921 Mammographic calcification found on diagnostic imaging of breast: Secondary | ICD-10-CM | POA: Diagnosis not present

## 2017-05-18 DIAGNOSIS — R928 Other abnormal and inconclusive findings on diagnostic imaging of breast: Secondary | ICD-10-CM

## 2017-05-18 DIAGNOSIS — R69 Illness, unspecified: Secondary | ICD-10-CM | POA: Diagnosis not present

## 2017-06-08 DIAGNOSIS — R69 Illness, unspecified: Secondary | ICD-10-CM | POA: Diagnosis not present

## 2017-07-13 ENCOUNTER — Encounter (HOSPITAL_COMMUNITY): Payer: Self-pay | Admitting: Psychiatry

## 2017-07-13 ENCOUNTER — Encounter (INDEPENDENT_AMBULATORY_CARE_PROVIDER_SITE_OTHER): Payer: Self-pay

## 2017-07-13 ENCOUNTER — Other Ambulatory Visit (HOSPITAL_COMMUNITY): Payer: Self-pay

## 2017-07-13 ENCOUNTER — Ambulatory Visit (INDEPENDENT_AMBULATORY_CARE_PROVIDER_SITE_OTHER): Payer: Medicare HMO | Admitting: Psychiatry

## 2017-07-13 VITALS — BP 128/82 | HR 116 | Ht 64.0 in | Wt 228.6 lb

## 2017-07-13 DIAGNOSIS — Z62810 Personal history of physical and sexual abuse in childhood: Secondary | ICD-10-CM

## 2017-07-13 DIAGNOSIS — R5383 Other fatigue: Secondary | ICD-10-CM

## 2017-07-13 DIAGNOSIS — Z630 Problems in relationship with spouse or partner: Secondary | ICD-10-CM

## 2017-07-13 DIAGNOSIS — R4582 Worries: Secondary | ICD-10-CM

## 2017-07-13 DIAGNOSIS — R4583 Excessive crying of child, adolescent or adult: Secondary | ICD-10-CM

## 2017-07-13 DIAGNOSIS — F419 Anxiety disorder, unspecified: Secondary | ICD-10-CM

## 2017-07-13 DIAGNOSIS — Z87891 Personal history of nicotine dependence: Secondary | ICD-10-CM

## 2017-07-13 DIAGNOSIS — F4010 Social phobia, unspecified: Secondary | ICD-10-CM

## 2017-07-13 DIAGNOSIS — F410 Panic disorder [episodic paroxysmal anxiety] without agoraphobia: Secondary | ICD-10-CM

## 2017-07-13 DIAGNOSIS — Z91411 Personal history of adult psychological abuse: Secondary | ICD-10-CM

## 2017-07-13 DIAGNOSIS — R69 Illness, unspecified: Secondary | ICD-10-CM | POA: Diagnosis not present

## 2017-07-13 DIAGNOSIS — Z560 Unemployment, unspecified: Secondary | ICD-10-CM

## 2017-07-13 DIAGNOSIS — F331 Major depressive disorder, recurrent, moderate: Secondary | ICD-10-CM

## 2017-07-13 DIAGNOSIS — R454 Irritability and anger: Secondary | ICD-10-CM | POA: Diagnosis not present

## 2017-07-13 DIAGNOSIS — F4310 Post-traumatic stress disorder, unspecified: Secondary | ICD-10-CM

## 2017-07-13 DIAGNOSIS — M549 Dorsalgia, unspecified: Secondary | ICD-10-CM

## 2017-07-13 DIAGNOSIS — M2550 Pain in unspecified joint: Secondary | ICD-10-CM

## 2017-07-13 MED ORDER — CLONAZEPAM 1 MG PO TABS: ORAL_TABLET | ORAL | 1 refills | Status: DC

## 2017-07-13 MED ORDER — BUSPIRONE HCL 30 MG PO TABS
ORAL_TABLET | ORAL | 1 refills | Status: DC
Start: 2017-07-13 — End: 2017-09-07

## 2017-07-13 MED ORDER — TRAZODONE HCL 300 MG PO TABS: 300.0000 mg | ORAL_TABLET | Freq: Every day | ORAL | 1 refills | Status: DC

## 2017-07-13 MED ORDER — FLUOXETINE HCL 40 MG PO CAPS: 40.0000 mg | ORAL_CAPSULE | Freq: Every day | ORAL | 1 refills | Status: DC

## 2017-07-13 MED ORDER — LAMOTRIGINE 200 MG PO TABS: ORAL_TABLET | ORAL | 1 refills | Status: DC

## 2017-07-13 MED ORDER — PRAZOSIN HCL 5 MG PO CAPS: 5.0000 mg | ORAL_CAPSULE | Freq: Every day | ORAL | 1 refills | Status: DC
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
## 2017-08-09 ENCOUNTER — Encounter (HOSPITAL_COMMUNITY): Payer: Self-pay | Admitting: Licensed Clinical Social Worker

## 2017-08-09 ENCOUNTER — Ambulatory Visit (INDEPENDENT_AMBULATORY_CARE_PROVIDER_SITE_OTHER): Payer: Medicare HMO | Admitting: Licensed Clinical Social Worker

## 2017-08-09 DIAGNOSIS — F411 Generalized anxiety disorder: Secondary | ICD-10-CM

## 2017-08-09 DIAGNOSIS — F332 Major depressive disorder, recurrent severe without psychotic features: Secondary | ICD-10-CM | POA: Diagnosis not present

## 2017-08-09 DIAGNOSIS — F4310 Post-traumatic stress disorder, unspecified: Secondary | ICD-10-CM

## 2017-08-09 DIAGNOSIS — R69 Illness, unspecified: Secondary | ICD-10-CM | POA: Diagnosis not present
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
## 2017-08-24 ENCOUNTER — Ambulatory Visit (HOSPITAL_COMMUNITY): Payer: Self-pay | Admitting: Psychiatry

## 2017-09-07 ENCOUNTER — Ambulatory Visit (INDEPENDENT_AMBULATORY_CARE_PROVIDER_SITE_OTHER): Payer: Medicare HMO | Admitting: Psychiatry

## 2017-09-07 ENCOUNTER — Encounter (HOSPITAL_COMMUNITY): Payer: Self-pay | Admitting: Psychiatry

## 2017-09-07 DIAGNOSIS — F331 Major depressive disorder, recurrent, moderate: Secondary | ICD-10-CM

## 2017-09-07 DIAGNOSIS — Z630 Problems in relationship with spouse or partner: Secondary | ICD-10-CM

## 2017-09-07 DIAGNOSIS — Z91411 Personal history of adult psychological abuse: Secondary | ICD-10-CM

## 2017-09-07 DIAGNOSIS — R51 Headache: Secondary | ICD-10-CM

## 2017-09-07 DIAGNOSIS — E119 Type 2 diabetes mellitus without complications: Secondary | ICD-10-CM | POA: Diagnosis not present

## 2017-09-07 DIAGNOSIS — F410 Panic disorder [episodic paroxysmal anxiety] without agoraphobia: Secondary | ICD-10-CM | POA: Diagnosis not present

## 2017-09-07 DIAGNOSIS — Z915 Personal history of self-harm: Secondary | ICD-10-CM

## 2017-09-07 DIAGNOSIS — M797 Fibromyalgia: Secondary | ICD-10-CM | POA: Diagnosis not present

## 2017-09-07 DIAGNOSIS — G8929 Other chronic pain: Secondary | ICD-10-CM

## 2017-09-07 DIAGNOSIS — I10 Essential (primary) hypertension: Secondary | ICD-10-CM

## 2017-09-07 DIAGNOSIS — E785 Hyperlipidemia, unspecified: Secondary | ICD-10-CM | POA: Diagnosis not present

## 2017-09-07 DIAGNOSIS — Z79899 Other long term (current) drug therapy: Secondary | ICD-10-CM

## 2017-09-07 DIAGNOSIS — F4310 Post-traumatic stress disorder, unspecified: Secondary | ICD-10-CM | POA: Diagnosis not present

## 2017-09-07 DIAGNOSIS — R69 Illness, unspecified: Secondary | ICD-10-CM | POA: Diagnosis not present

## 2017-09-07 MED ORDER — PRAZOSIN HCL 5 MG PO CAPS: 5.0000 mg | ORAL_CAPSULE | Freq: Every day | ORAL | 1 refills | Status: DC

## 2017-09-07 MED ORDER — TRAZODONE HCL 300 MG PO TABS: 300.0000 mg | ORAL_TABLET | Freq: Every day | ORAL | 1 refills | Status: DC

## 2017-09-07 MED ORDER — LAMOTRIGINE 150 MG PO TABS: 150.0000 mg | ORAL_TABLET | Freq: Two times a day (BID) | ORAL | 1 refills | Status: DC

## 2017-09-07 MED ORDER — BUSPIRONE HCL 30 MG PO TABS: ORAL_TABLET | ORAL | 1 refills | Status: DC

## 2017-09-07 MED ORDER — CLONAZEPAM 1 MG PO TABS: ORAL_TABLET | ORAL | 1 refills | Status: DC

## 2017-09-07 MED ORDER — FLUOXETINE HCL 40 MG PO CAPS: 40.0000 mg | ORAL_CAPSULE | Freq: Every day | ORAL | 1 refills | Status: DC
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
## 2017-09-20 DIAGNOSIS — J189 Pneumonia, unspecified organism: Secondary | ICD-10-CM

## 2017-09-20 HISTORY — DX: Pneumonia, unspecified organism: J18.9

## 2017-09-26 ENCOUNTER — Ambulatory Visit (HOSPITAL_COMMUNITY): Payer: Self-pay | Admitting: Licensed Clinical Social Worker

## 2017-09-30 DIAGNOSIS — R05 Cough: Secondary | ICD-10-CM | POA: Diagnosis not present

## 2017-09-30 DIAGNOSIS — J189 Pneumonia, unspecified organism: Secondary | ICD-10-CM | POA: Diagnosis not present

## 2017-09-30 DIAGNOSIS — H6122 Impacted cerumen, left ear: Secondary | ICD-10-CM | POA: Diagnosis not present

## 2017-09-30 DIAGNOSIS — E119 Type 2 diabetes mellitus without complications: Secondary | ICD-10-CM | POA: Diagnosis not present

## 2017-09-30 DIAGNOSIS — J449 Chronic obstructive pulmonary disease, unspecified: Secondary | ICD-10-CM | POA: Diagnosis not present

## 2017-10-13 ENCOUNTER — Ambulatory Visit (HOSPITAL_COMMUNITY)
Admission: RE | Admit: 2017-10-13 | Discharge: 2017-10-13 | Disposition: A | Discharge status: Home / Self Care | Payer: Medicare HMO | Source: Ambulatory Visit | Attending: Internal Medicine | Admitting: Internal Medicine

## 2017-10-13 ENCOUNTER — Other Ambulatory Visit (HOSPITAL_COMMUNITY): Payer: Self-pay | Admitting: Internal Medicine

## 2017-10-13 DIAGNOSIS — R0602 Shortness of breath: Secondary | ICD-10-CM | POA: Diagnosis not present

## 2017-10-13 DIAGNOSIS — R05 Cough: Secondary | ICD-10-CM | POA: Diagnosis not present

## 2017-10-13 DIAGNOSIS — J432 Centrilobular emphysema: Secondary | ICD-10-CM | POA: Diagnosis not present

## 2017-10-13 DIAGNOSIS — R590 Localized enlarged lymph nodes: Secondary | ICD-10-CM | POA: Diagnosis not present

## 2017-10-13 DIAGNOSIS — R062 Wheezing: Secondary | ICD-10-CM | POA: Diagnosis not present

## 2017-10-13 DIAGNOSIS — I700 Atherosclerosis of aorta: Secondary | ICD-10-CM | POA: Insufficient documentation

## 2017-10-13 DIAGNOSIS — R918 Other nonspecific abnormal finding of lung field: Secondary | ICD-10-CM | POA: Insufficient documentation

## 2017-10-13 DIAGNOSIS — J40 Bronchitis, not specified as acute or chronic: Secondary | ICD-10-CM | POA: Diagnosis not present

## 2017-10-13 DIAGNOSIS — R0600 Dyspnea, unspecified: Secondary | ICD-10-CM | POA: Diagnosis not present

## 2017-10-13 DIAGNOSIS — R0789 Other chest pain: Secondary | ICD-10-CM | POA: Diagnosis not present

## 2017-10-13 DIAGNOSIS — I281 Aneurysm of pulmonary artery: Secondary | ICD-10-CM | POA: Diagnosis not present

## 2017-10-13 LAB — POCT I-STAT CREATININE: Creatinine, Ser: 0.7 mg/dL (ref 0.44–1.00)

## 2017-10-13 MED ORDER — IOPAMIDOL (ISOVUE-370) INJECTION 76%
INTRAVENOUS | Status: AC
  Administered 2017-10-13: 100 mL
  Filled 2017-10-13: qty 100

## 2017-11-08 ENCOUNTER — Encounter (HOSPITAL_COMMUNITY): Payer: Self-pay | Admitting: Psychiatry

## 2017-11-08 ENCOUNTER — Other Ambulatory Visit (HOSPITAL_COMMUNITY): Payer: Self-pay | Admitting: Psychiatry

## 2017-11-08 ENCOUNTER — Ambulatory Visit (INDEPENDENT_AMBULATORY_CARE_PROVIDER_SITE_OTHER): Payer: Medicare HMO | Admitting: Psychiatry

## 2017-11-08 DIAGNOSIS — F419 Anxiety disorder, unspecified: Secondary | ICD-10-CM

## 2017-11-08 DIAGNOSIS — F331 Major depressive disorder, recurrent, moderate: Secondary | ICD-10-CM

## 2017-11-08 DIAGNOSIS — Z87891 Personal history of nicotine dependence: Secondary | ICD-10-CM

## 2017-11-08 DIAGNOSIS — Z915 Personal history of self-harm: Secondary | ICD-10-CM | POA: Diagnosis not present

## 2017-11-08 DIAGNOSIS — F4310 Post-traumatic stress disorder, unspecified: Secondary | ICD-10-CM

## 2017-11-08 DIAGNOSIS — F515 Nightmare disorder: Secondary | ICD-10-CM | POA: Diagnosis not present

## 2017-11-08 DIAGNOSIS — R69 Illness, unspecified: Secondary | ICD-10-CM | POA: Diagnosis not present

## 2017-11-08 MED ORDER — TRAZODONE HCL 300 MG PO TABS: 300.0000 mg | ORAL_TABLET | Freq: Every day | ORAL | 0 refills | Status: DC

## 2017-11-08 MED ORDER — PRAZOSIN HCL 5 MG PO CAPS: 5.0000 mg | ORAL_CAPSULE | Freq: Every day | ORAL | 0 refills | Status: DC

## 2017-11-08 MED ORDER — BUSPIRONE HCL 30 MG PO TABS: ORAL_TABLET | ORAL | 0 refills | Status: DC

## 2017-11-08 MED ORDER — LAMOTRIGINE 150 MG PO TABS: 150.0000 mg | ORAL_TABLET | Freq: Two times a day (BID) | ORAL | 1 refills | Status: DC

## 2017-11-08 MED ORDER — CLONAZEPAM 1 MG PO TABS: ORAL_TABLET | ORAL | 2 refills | Status: DC

## 2017-11-08 MED ORDER — FLUOXETINE HCL 40 MG PO CAPS: 40.0000 mg | ORAL_CAPSULE | Freq: Every day | ORAL | 0 refills | Status: DC

## 2017-11-08 NOTE — Progress Notes (Signed)
[facility] MD/PA/NP OP Progress Note  11/08/2017 3:35 PM [name]  MRN:  [id number]  Chief Complaint: I like to increase Lamictal.  It is helping my mood and irritability.  HPI: [name] came for her follow-up appointment.  On her last visit we increase Lamictal to help her mood swings and irritability.  She is feeling better since the dose increase.  She still have anxiety and nervousness and taking Klonopin and BuSpar.  She admitted not able to see Janett Billow for therapy because of the co-pay.  She is wondering if she can go back to [facility] to see a therapist because it was not as expensive.  She still have nightmares and flashback.  She is still have anxiety but denies any suicidal thoughts or homicidal thought.  She has chronic symptoms of lack of motivation, energy, fatigue and poor concentration.  She has multiple health issues including fibromyalgia, chronic pain, high blood pressure, diabetes and headaches.  We discussed in length about polypharmacy but patient is very reluctant to cut down her medication.  She does not want to change or reduce the medication.  She has no rash, itching, tremors or shakes.  Patient has no paranoia or any hallucination.  Patient denies drinking alcohol or using any illegal substances.  Visit Diagnosis:    ICD-10-CM   1. MDD (major depressive disorder), recurrent episode, moderate (HCC) F33.1 trazodone (DESYREL) 300 MG tablet    prazosin (MINIPRESS) 5 MG capsule    lamoTRIgine (LAMICTAL) 150 MG tablet    FLUoxetine (PROZAC) 40 MG capsule    clonazePAM (KLONOPIN) 1 MG tablet    busPIRone (BUSPAR) 30 MG tablet    Past Psychiatric History: Reviewed. Patient has history of depression and anxiety most of her life.  She was admitted 2 years ago at [facility] at hospital due to suicidal attempt after taking overdose on Valium and cut her wrist.  She was seeing Dr. Jordan Hawks at [facility].  She remember taking Abilify and Risperdal and some other medication which she  do not remember.  Patient denies any history of psychosis, paranoia, mania but had history of nightmares and flashback.  Past Medical History:  Past Medical History:  Diagnosis Date  . Acid reflux disease   . Anxiety   . Asthma   . Bronchitis, chronic (HCC)    Effects worse with URI  . COPD (chronic obstructive pulmonary disease) ([place])   . Depression   . 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  . Diverticulitis   . Fibromyalgia   . Fibromyalgia   . GERD (gastroesophageal reflux disease)   . Headache(784.0)    migraine headache  . Kidney stones    at least 6 stones in past  . Restless leg syndrome    Bilateral  . Vomiting    last night at 2300 after last round of antibiotic    Past Surgical History:  Procedure Laterality Date  . ABDOMINAL HYSTERECTOMY    . APPENDECTOMY    . APPLICATION OF WOUND VAC N/A 11/01/2012   Procedure: APPLICATION OF WOUND VAC;  Surgeon: Joyice Faster. Cornett, MD;  [facility];  Service: General;  Laterality: N/A;  . CHOLECYSTECTOMY    . COLOSTOMY    . COLOSTOMY CLOSURE N/A 11/01/2012   Procedure: COLOSTOMY CLOSURE;  Surgeon: Joyice Faster. Cornett, MD;  [facility];  Service: General;  Laterality: N/A;  REPAIR PARASTOMAL HERNIA; REVISION OF COLOSTOMY  . COLOSTOMY CLOSURE N/A 05/03/2013   Procedure: COLOSTOMY CLOSURE;  Surgeon:  Thomas A. Cornett, MD;  Location: [facility];  Service: General;  Laterality: N/A;  . ESOPHAGOGASTRODUODENOSCOPY Left 11/27/2012   Procedure: ESOPHAGOGASTRODUODENOSCOPY (EGD);  Surgeon: Winfield Cunas., MD;  Location: [facility];  Service: Gastroenterology;  Laterality: Left;  . INCISIONAL HERNIA REPAIR N/A 11/01/2012   Procedure: HERNIA REPAIR INCISIONAL;  Surgeon: Joyice Faster. Cornett, MD;  [facility];  Service: General;  Laterality: N/A;  . LAPAROTOMY  11/01/2012   Procedure: EXPLORATORY LAPAROTOMY;  Surgeon: Joyice Faster. Cornett, MD;  [facility];  Service: General;;  exploratory laparotomy,repair of  parastomal hernia and incisional hernia, lysis of adhesions, and application of wound V.A.C.  . LYSIS OF ADHESION N/A 11/01/2012   Procedure: LYSIS OF ADHESION;  Surgeon: Joyice Faster. Cornett, MD;  [facility];  Service: General;  Laterality: N/A;  . PARASTOMAL HERNIA REPAIR N/A 11/01/2012   Procedure: HERNIA REPAIR PARASTOMAL;  Surgeon: Joyice Faster. Cornett, MD;  [facility];  Service: General;  Laterality: N/A;  . PARTIAL HYSTERECTOMY     partial    Family Psychiatric History: Reviewed.  Family History:  Family History  Problem Relation Age of Onset  . Diabetes Mother   . Deep vein thrombosis Neg Hx   . Pulmonary embolism Neg Hx     Social History:  Social History   Socioeconomic History  . Marital status: Married    Spouse name: None  . Number of children: None  . Years of education: None  . Highest education level: None  Social Needs  . Financial resource strain: None  . Food insecurity - worry: None  . Food insecurity - inability: None  . Transportation needs - medical: None  . Transportation needs - non-medical: None  Occupational History  . None  Tobacco Use  . Smoking status: Former Smoker    Packs/day: 1.00    Types: Cigarettes  . Smokeless tobacco: Never Used  . Tobacco comment: about one pack a day on a bad day  Substance and Sexual Activity  . Alcohol use: No  . Drug use: No  . Sexual activity: No    Birth control/protection: None  Other Topics Concern  . None  Social History Narrative  . None    Allergies:  Allergies  Allergen Reactions  . Tramadol     Per Dr. Sharyon Medicus is renal failure  . Protonix [Pantoprazole Sodium] Other (See Comments)    Hair loss    Metabolic Disorder Labs: Lab Results  Component Value Date   HGBA1C 6.2 (H) 11/03/2012   MPG 131 (H) 11/03/2012   No results found for: PROLACTIN No results found for: CHOL, TRIG, HDL, CHOLHDL, VLDL, LDLCALC No results found for: TSH  Therapeutic Level Labs: No results  found for: LITHIUM No results found for: VALPROATE No components found for:  CBMZ  Current Medications: Current Outpatient Medications  Medication Sig Dispense Refill  . atorvastatin (LIPITOR) 40 MG tablet Take 40 mg by mouth daily at 6 PM.     . busPIRone (BUSPAR) 30 MG tablet One tab three times a day 90 tablet 1  . clonazePAM (KLONOPIN) 1 MG tablet Take 1 tab twice daily 60 tablet 1  . FLUoxetine (PROZAC) 40 MG capsule Take 1 capsule (40 mg total) by mouth daily. 30 capsule 1  . lamoTRIgine (LAMICTAL) 150 MG tablet Take 1 tablet (150 mg total) by mouth 2 (two) times daily. 60 tablet 1  . lisinopril (PRINIVIL,ZESTRIL) 20 MG tablet TK 1 T PO D  3  . metFORMIN (  GLUCOPHAGE) 1000 MG tablet Take 1,000 mg by mouth 2 (two) times daily with a meal.     . omeprazole (PRILOSEC) 40 MG capsule TK 1 C PO D  6  . ondansetron (ZOFRAN) 4 MG tablet TK 1 T PO Q 8 H PRF NAUSEA  2  . prazosin (MINIPRESS) 5 MG capsule Take 1 capsule (5 mg total) by mouth at bedtime. 30 capsule 1  . traMADol (ULTRAM) 50 MG tablet TK 1 T PO Q 8 H PRN P  2  . trazodone (DESYREL) 300 MG tablet Take 1 tablet (300 mg total) by mouth at bedtime. 30 tablet 1  . VENTOLIN HFA 108 (90 BASE) MCG/ACT inhaler Inhale 2 puffs into the lungs every 6 (six) hours as needed for wheezing or shortness of breath.      No current facility-administered medications for this visit.      Musculoskeletal: Strength & Muscle Tone: within normal limits Gait & Station: normal Patient leans: N/A  Psychiatric Specialty Exam: ROS  Blood pressure 137/82, pulse 78, height '5\' 3"'  (1.6 m), weight 230 lb 3.2 oz (104.4 kg), last menstrual period 04/20/2013.Body mass index is 40.78 kg/m.  General Appearance: Casual  Eye Contact:  Fair  Speech:  Slow  Volume:  Normal  Mood:  Dysphoric  Affect:  Congruent  Thought Process:  Goal Directed  Orientation:  Full (Time, Place, and Person)  Thought Content: Rumination   Suicidal Thoughts:  No  Homicidal  Thoughts:  No  Memory:  Immediate;   Good Recent;   Good Remote;   Good  Judgement:  Good  Insight:  Good  Psychomotor Activity:  Decreased  Concentration:  Concentration: Fair and Attention Span: Fair  Recall:  Good  Fund of Knowledge: Good  Language: Good  Akathisia:  No  Handed:  Right  AIMS (if indicated): not done  Assets:  Communication Skills Desire for Improvement Housing Resilience Social Support  ADL's:  Intact  Cognition: WNL  Sleep:  Good   Screenings:   Assessment and Plan: Major depressive disorder, recurrent.  Panic attacks.  Posttraumatic stress disorder.  Patient seems to be improved from increase Lamictal.  She has no rash, itching, tremors or shakes.  Discussed polypharmacy and patient is reluctant to cut down any other medication.  I encouraged to see therapist but due to high co-pay she cannot afford individual counseling.  I recommended to [facility] to [facility] a week group therapy.  She also met thinking to go back to [facility] for therapy as she can afford the co-pay.  I will continue trazodone 300 mg at bedtime, Prozac 40 mg daily, Klonopin 1 mg twice a day, BuSpar 30 mg 3 times a day and Minipress 5 mg at bedtime.  Discussed medication side effects and benefits.  Recommended to call us back if she has any question, concern or if you feel worsening of the symptoms.  Follow-up in 3 months.   Kathlee Nations, MD 11/08/2017, 3:35 PM

## 2017-11-16 DIAGNOSIS — R1084 Generalized abdominal pain: Secondary | ICD-10-CM | POA: Diagnosis not present

## 2017-11-16 DIAGNOSIS — Z6841 Body Mass Index (BMI) 40.0 and over, adult: Secondary | ICD-10-CM | POA: Diagnosis not present

## 2017-11-16 DIAGNOSIS — R921 Mammographic calcification found on diagnostic imaging of breast: Secondary | ICD-10-CM | POA: Diagnosis not present

## 2017-11-16 DIAGNOSIS — R69 Illness, unspecified: Secondary | ICD-10-CM | POA: Diagnosis not present

## 2017-11-16 DIAGNOSIS — E781 Pure hyperglyceridemia: Secondary | ICD-10-CM | POA: Diagnosis not present

## 2017-11-16 DIAGNOSIS — E7849 Other hyperlipidemia: Secondary | ICD-10-CM | POA: Diagnosis not present

## 2017-11-16 DIAGNOSIS — E119 Type 2 diabetes mellitus without complications: Secondary | ICD-10-CM | POA: Diagnosis not present

## 2017-11-16 DIAGNOSIS — J449 Chronic obstructive pulmonary disease, unspecified: Secondary | ICD-10-CM | POA: Diagnosis not present

## 2017-11-16 DIAGNOSIS — I10 Essential (primary) hypertension: Secondary | ICD-10-CM | POA: Diagnosis not present

## 2017-11-17 ENCOUNTER — Other Ambulatory Visit (HOSPITAL_COMMUNITY): Payer: Self-pay | Admitting: Internal Medicine

## 2017-11-17 DIAGNOSIS — R1084 Generalized abdominal pain: Secondary | ICD-10-CM

## 2017-11-21 ENCOUNTER — Ambulatory Visit
Admission: RE | Admit: 2017-11-21 | Discharge: 2017-11-21 | Disposition: A | Discharge status: Home / Self Care | Payer: Medicare HMO | Source: Ambulatory Visit | Attending: Internal Medicine | Admitting: Internal Medicine

## 2017-11-21 DIAGNOSIS — R921 Mammographic calcification found on diagnostic imaging of breast: Secondary | ICD-10-CM | POA: Diagnosis not present

## 2017-11-25 ENCOUNTER — Encounter (HOSPITAL_COMMUNITY)
Admission: RE | Admit: 2017-11-25 | Discharge: 2017-11-25 | Disposition: A | Discharge status: Home / Self Care | Payer: Medicare HMO | Source: Ambulatory Visit | Attending: Internal Medicine | Admitting: Internal Medicine

## 2017-11-25 DIAGNOSIS — R1013 Epigastric pain: Secondary | ICD-10-CM | POA: Diagnosis not present

## 2017-11-25 DIAGNOSIS — R1084 Generalized abdominal pain: Secondary | ICD-10-CM | POA: Insufficient documentation

## 2017-11-25 DIAGNOSIS — R112 Nausea with vomiting, unspecified: Secondary | ICD-10-CM | POA: Diagnosis not present

## 2017-11-25 MED ORDER — TECHNETIUM TC 99M SULFUR COLLOID
2.0000 | Freq: Once | INTRAVENOUS | Status: AC | PRN
  Administered 2017-11-25: 2 via ORAL

## 2017-12-13 ENCOUNTER — Other Ambulatory Visit (HOSPITAL_COMMUNITY): Payer: Self-pay

## 2017-12-13 DIAGNOSIS — F331 Major depressive disorder, recurrent, moderate: Secondary | ICD-10-CM

## 2017-12-13 MED ORDER — LAMOTRIGINE 150 MG PO TABS: 150.0000 mg | ORAL_TABLET | Freq: Two times a day (BID) | ORAL | 0 refills | Status: DC

## 2018-01-04 DIAGNOSIS — R109 Unspecified abdominal pain: Secondary | ICD-10-CM | POA: Diagnosis not present

## 2018-01-04 DIAGNOSIS — R197 Diarrhea, unspecified: Secondary | ICD-10-CM | POA: Diagnosis not present

## 2018-02-04 ENCOUNTER — Other Ambulatory Visit (HOSPITAL_COMMUNITY): Payer: Self-pay | Admitting: Psychiatry

## 2018-02-04 DIAGNOSIS — F331 Major depressive disorder, recurrent, moderate: Secondary | ICD-10-CM

## 2018-02-06 ENCOUNTER — Other Ambulatory Visit (HOSPITAL_COMMUNITY): Payer: Self-pay | Admitting: Psychiatry

## 2018-02-06 DIAGNOSIS — F331 Major depressive disorder, recurrent, moderate: Secondary | ICD-10-CM

## 2018-02-07 ENCOUNTER — Encounter (HOSPITAL_COMMUNITY): Payer: Self-pay | Admitting: Psychiatry

## 2018-02-07 ENCOUNTER — Ambulatory Visit (INDEPENDENT_AMBULATORY_CARE_PROVIDER_SITE_OTHER): Payer: Medicare HMO | Admitting: Psychiatry

## 2018-02-07 DIAGNOSIS — R450 Nervousness: Secondary | ICD-10-CM | POA: Diagnosis not present

## 2018-02-07 DIAGNOSIS — R69 Illness, unspecified: Secondary | ICD-10-CM | POA: Diagnosis not present

## 2018-02-07 DIAGNOSIS — F419 Anxiety disorder, unspecified: Secondary | ICD-10-CM

## 2018-02-07 DIAGNOSIS — Z87891 Personal history of nicotine dependence: Secondary | ICD-10-CM

## 2018-02-07 DIAGNOSIS — F331 Major depressive disorder, recurrent, moderate: Secondary | ICD-10-CM

## 2018-02-07 DIAGNOSIS — F4310 Post-traumatic stress disorder, unspecified: Secondary | ICD-10-CM

## 2018-02-07 DIAGNOSIS — F410 Panic disorder [episodic paroxysmal anxiety] without agoraphobia: Secondary | ICD-10-CM

## 2018-02-07 MED ORDER — FLUOXETINE HCL 40 MG PO CAPS: 40.0000 mg | ORAL_CAPSULE | Freq: Every day | ORAL | 0 refills | Status: DC

## 2018-02-07 MED ORDER — PRAZOSIN HCL 5 MG PO CAPS: 5.0000 mg | ORAL_CAPSULE | Freq: Every day | ORAL | 0 refills | Status: DC

## 2018-02-07 MED ORDER — LAMOTRIGINE 150 MG PO TABS: 150.0000 mg | ORAL_TABLET | Freq: Two times a day (BID) | ORAL | 0 refills | Status: DC

## 2018-02-07 MED ORDER — CLONAZEPAM 1 MG PO TABS: ORAL_TABLET | ORAL | 2 refills | Status: DC

## 2018-02-07 MED ORDER — TRAZODONE HCL 300 MG PO TABS: 300.0000 mg | ORAL_TABLET | Freq: Every day | ORAL | 0 refills | Status: DC

## 2018-02-07 NOTE — Progress Notes (Signed)
[facility] MD/PA/NP OP Progress Note  02/07/2018 2:40 PM [name]  MRN:  [id number]  Chief Complaint: I am taking my medication.  My mother diagnosed with throat cancer.  I am taking her to radiation treatment.  HPI: [name] came for her follow-up appointment.  She is taking her medication as prescribed.  She admitted lately more stressed out because her mother diagnosed with throat cancer and she is taking her to the doctor's appointment.  Her mother will start radiation and she has to bring her every day.  Patient told some time her husband is not happy because home spending too much time with my mother.  She is sleeping on and off.  She still have nightmares and flashback.  She still have anxiety and nervousness.  She has multiple health issues.  She has chronic symptoms of fatigue, poor attention, poor concentration, lack of energy.  She is taking multiple medication and we discussed polypharmacy.  I mention may be taking too much medication causing fatigue.  After some discussion she agreed to discontinue BuSpar.  She is taking BuSpar 30 mg up to 3 times a day.  Patient denies any paranoia, hallucination, suicidal thoughts or homicidal thought.  She denies drinking or using any illegal substances.  We have recommended to reconsider therapy but due to financial reason and being very busy she cannot come for counseling.  Visit Diagnosis:    ICD-10-CM   1. MDD (major depressive disorder), recurrent episode, moderate (HCC) F33.1 trazodone (DESYREL) 300 MG tablet    prazosin (MINIPRESS) 5 MG capsule    lamoTRIgine (LAMICTAL) 150 MG tablet    clonazePAM (KLONOPIN) 1 MG tablet    FLUoxetine (PROZAC) 40 MG capsule    Past Psychiatric History: Reviewed Patient has history of depression and anxiety most of her life. She was admitted 2 years ago at [facility] at hospital due to suicidal attempt after taking overdose on Valium and cut her wrist. She was [facility]. She remember  taking Abilify and Risperdal and some other medication which she do not remember. Patient denies any history of psychosis, paranoia, mania but had history of nightmares and flashback.  Past Medical History:  Past Medical History:  Diagnosis Date  . Acid reflux disease   . Anxiety   . Asthma   . Bronchitis, chronic (HCC)    Effects worse with URI  . COPD (chronic obstructive pulmonary disease) ([place])   . Depression   . 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  . Diverticulitis   . Fibromyalgia   . Fibromyalgia   . GERD (gastroesophageal reflux disease)   . Headache(784.0)    migraine headache  . Kidney stones    at least 6 stones in past  . Restless leg syndrome    Bilateral  . Vomiting    last night at 2300 after last round of antibiotic    Past Surgical History:  Procedure Laterality Date  . ABDOMINAL HYSTERECTOMY    . APPENDECTOMY    . APPLICATION OF WOUND VAC N/A 11/01/2012   Procedure: APPLICATION OF WOUND VAC;  Surgeon: Joyice Faster. Cornett, MD;  [facility];  Service: General;  Laterality: N/A;  . CHOLECYSTECTOMY    . COLOSTOMY    . COLOSTOMY CLOSURE N/A 11/01/2012   Procedure: COLOSTOMY CLOSURE;  Surgeon: Joyice Faster. Cornett, MD;  [facility];  Service: General;  Laterality: N/A;  REPAIR PARASTOMAL HERNIA; REVISION OF COLOSTOMY  . COLOSTOMY CLOSURE N/A 05/03/2013  Procedure: COLOSTOMY CLOSURE;  Surgeon: Joyice Faster. Cornett, MD;  Location: [facility];  Service: General;  Laterality: N/A;  . ESOPHAGOGASTRODUODENOSCOPY Left 11/27/2012   Procedure: ESOPHAGOGASTRODUODENOSCOPY (EGD);  Surgeon: Winfield Cunas., MD;  Location: [facility];  Service: Gastroenterology;  Laterality: Left;  . INCISIONAL HERNIA REPAIR N/A 11/01/2012   Procedure: HERNIA REPAIR INCISIONAL;  Surgeon: Joyice Faster. Cornett, MD;  [facility];  Service: General;  Laterality: N/A;  . LAPAROTOMY  11/01/2012   Procedure: EXPLORATORY LAPAROTOMY;  Surgeon: Joyice Faster. Cornett, MD;  [facility];  Service: General;;  exploratory laparotomy,repair of parastomal hernia and incisional hernia, lysis of adhesions, and application of wound V.A.C.  . LYSIS OF ADHESION N/A 11/01/2012   Procedure: LYSIS OF ADHESION;  Surgeon: Joyice Faster. Cornett, MD;  [facility];  Service: General;  Laterality: N/A;  . PARASTOMAL HERNIA REPAIR N/A 11/01/2012   Procedure: HERNIA REPAIR PARASTOMAL;  Surgeon: Joyice Faster. Cornett, MD;  [facility];  Service: General;  Laterality: N/A;  . PARTIAL HYSTERECTOMY     partial    Family Psychiatric History: Reviewed.  Family History:  Family History  Problem Relation Age of Onset  . Diabetes Mother   . Deep vein thrombosis Neg Hx   . Pulmonary embolism Neg Hx     Social History:  Social History   Socioeconomic History  . Marital status: Married    Spouse name: Not on file  . Number of children: Not on file  . Years of education: Not on file  . Highest education level: Not on file  Occupational History  . Not on file  Social Needs  . Financial resource strain: Not on file  . Food insecurity:    Worry: Not on file    Inability: Not on file  . Transportation needs:    Medical: Not on file    Non-medical: Not on file  Tobacco Use  . Smoking status: Former Smoker    Packs/day: 1.00    Types: Cigarettes  . Smokeless tobacco: Never Used  . Tobacco comment: about one pack a day on a bad day  Substance and Sexual Activity  . Alcohol use: No  . Drug use: No  . Sexual activity: Never    Birth control/protection: None  Lifestyle  . Physical activity:    Days per week: Not on file    Minutes per session: Not on file  . Stress: Not on file  Relationships  . Social connections:    Talks on phone: Not on file    Gets together: Not on file    Attends religious service: Not on file    Active member of club or organization: Not on file    Attends meetings of clubs or organizations: Not on file    Relationship status: Not on file  Other  Topics Concern  . Not on file  Social History Narrative  . Not on file    Allergies:  Allergies  Allergen Reactions  . Tramadol     Per Dr. Sharyon Medicus is renal failure    Metabolic Disorder Labs: Lab Results  Component Value Date   HGBA1C 6.2 (H) 11/03/2012   MPG 131 (H) 11/03/2012   No results found for: PROLACTIN No results found for: CHOL, TRIG, HDL, CHOLHDL, VLDL, LDLCALC No results found for: TSH  Therapeutic Level Labs: No results found for: LITHIUM No results found for: VALPROATE No components found for:  CBMZ  Current Medications: Current Outpatient Medications  Medication Sig  Dispense Refill  . atorvastatin (LIPITOR) 40 MG tablet Take 40 mg by mouth daily at 6 PM.     . busPIRone (BUSPAR) 30 MG tablet One tab three times a day 270 tablet 0  . clonazePAM (KLONOPIN) 1 MG tablet Take 1 tab twice daily 60 tablet 2  . FLUoxetine (PROZAC) 40 MG capsule Take 1 capsule (40 mg total) by mouth daily. 90 capsule 0  . lamoTRIgine (LAMICTAL) 150 MG tablet Take 1 tablet (150 mg total) by mouth 2 (two) times daily. 180 tablet 0  . lisinopril (PRINIVIL,ZESTRIL) 20 MG tablet TK 1 T PO D  3  . metFORMIN (GLUCOPHAGE) 1000 MG tablet Take 1,000 mg by mouth 2 (two) times daily with a meal.     . omeprazole (PRILOSEC) 40 MG capsule TK 1 C PO D  6  . prazosin (MINIPRESS) 5 MG capsule Take 1 capsule (5 mg total) by mouth at bedtime. 90 capsule 0  . traMADol (ULTRAM) 50 MG tablet TK 1 T PO Q 8 H PRN P  2  . trazodone (DESYREL) 300 MG tablet Take 1 tablet (300 mg total) by mouth at bedtime. 90 tablet 0  . VENTOLIN HFA 108 (90 BASE) MCG/ACT inhaler Inhale 2 puffs into the lungs every 6 (six) hours as needed for wheezing or shortness of breath.     . ondansetron (ZOFRAN) 4 MG tablet TK 1 T PO Q 8 H PRF NAUSEA  2   No current facility-administered medications for this visit.      Musculoskeletal: Strength & Muscle Tone: within normal limits Gait & Station: normal Patient leans:  N/A  Psychiatric Specialty Exam: Review of Systems  Constitutional:       Tired and fatigue  Psychiatric/Behavioral: The patient is nervous/anxious.     Blood pressure 124/74, pulse 89, height 5\' 3"  (1.6 m), weight 219 lb (99.3 kg), last menstrual period 04/20/2013, SpO2 94 %.Body mass index is 38.79 kg/m.  General Appearance: Casual  Eye Contact:  Fair  Speech:  Clear and Coherent  Volume:  Normal  Mood:  Anxious and Dysphoric  Affect:  Congruent  Thought Process:  Goal Directed  Orientation:  Full (Time, Place, and Person)  Thought Content: Rumination   Suicidal Thoughts:  No  Homicidal Thoughts:  No  Memory:  Immediate;   Good Recent;   Good Remote;   Good  Judgement:  Good  Insight:  Present  Psychomotor Activity:  Decreased  Concentration:  Concentration: Fair and Attention Span: Fair  Recall:  Good  Fund of Knowledge: Good  Language: Good  Akathisia:  No  Handed:  Right  AIMS (if indicated): not done  Assets:  Communication Skills Desire for Improvement Housing Resilience  ADL's:  Intact  Cognition: WNL  Sleep:  Good   Screenings:   Assessment and Plan: Major depressive disorder, recurrent.  Panic attack.  Posttraumatic stress disorder.  Patient complaining of chronic fatigue and feeling tired.  Discussed polypharmacy.  After some discussion she agreed to stop BuSpar.  She already taking trazodone, Prozac, Klonopin, Minipress and Lamictal.  She has no rash, itching.  She has no tremors or any shakes.  I recommended if her anxiety started to come back then we will consider putting her back on BuSpar.  However at this time polypharmacy may be contributing to her fatigue.  Continue Klonopin 1 mg twice a day, Prozac 40 mg daily, trazodone 300 mg at bedtime, Minipress 5 mg at bedtime and Lamictal 300 mg daily.  Recommended  to call us back if she has any question or any concern.  Follow-up in 3 months.   Kathlee Nations, MD 02/07/2018, 2:40 PM

## 2018-04-28 DIAGNOSIS — E7849 Other hyperlipidemia: Secondary | ICD-10-CM | POA: Diagnosis not present

## 2018-04-28 DIAGNOSIS — R82998 Other abnormal findings in urine: Secondary | ICD-10-CM | POA: Diagnosis not present

## 2018-04-28 DIAGNOSIS — E119 Type 2 diabetes mellitus without complications: Secondary | ICD-10-CM | POA: Diagnosis not present

## 2018-04-28 DIAGNOSIS — I10 Essential (primary) hypertension: Secondary | ICD-10-CM | POA: Diagnosis not present

## 2018-05-04 DIAGNOSIS — I10 Essential (primary) hypertension: Secondary | ICD-10-CM | POA: Diagnosis not present

## 2018-05-04 DIAGNOSIS — R69 Illness, unspecified: Secondary | ICD-10-CM | POA: Diagnosis not present

## 2018-05-04 DIAGNOSIS — E781 Pure hyperglyceridemia: Secondary | ICD-10-CM | POA: Diagnosis not present

## 2018-05-04 DIAGNOSIS — Z0000 Encounter for general adult medical examination without abnormal findings: Secondary | ICD-10-CM | POA: Diagnosis not present

## 2018-05-04 DIAGNOSIS — J449 Chronic obstructive pulmonary disease, unspecified: Secondary | ICD-10-CM | POA: Diagnosis not present

## 2018-05-04 DIAGNOSIS — I700 Atherosclerosis of aorta: Secondary | ICD-10-CM | POA: Diagnosis not present

## 2018-05-04 DIAGNOSIS — Z6841 Body Mass Index (BMI) 40.0 and over, adult: Secondary | ICD-10-CM | POA: Diagnosis not present

## 2018-05-04 DIAGNOSIS — E7849 Other hyperlipidemia: Secondary | ICD-10-CM | POA: Diagnosis not present

## 2018-05-08 ENCOUNTER — Other Ambulatory Visit (HOSPITAL_COMMUNITY): Payer: Self-pay | Admitting: Psychiatry

## 2018-05-08 DIAGNOSIS — F331 Major depressive disorder, recurrent, moderate: Secondary | ICD-10-CM

## 2018-05-10 ENCOUNTER — Ambulatory Visit (HOSPITAL_COMMUNITY): Payer: Self-pay | Admitting: Psychiatry

## 2018-05-12 DIAGNOSIS — Z1212 Encounter for screening for malignant neoplasm of rectum: Secondary | ICD-10-CM | POA: Diagnosis not present

## 2018-05-15 ENCOUNTER — Other Ambulatory Visit (HOSPITAL_COMMUNITY): Payer: Self-pay

## 2018-05-15 DIAGNOSIS — F331 Major depressive disorder, recurrent, moderate: Secondary | ICD-10-CM

## 2018-05-15 MED ORDER — TRAZODONE HCL 300 MG PO TABS: 300.0000 mg | ORAL_TABLET | Freq: Every day | ORAL | 0 refills | Status: DC

## 2018-05-25 ENCOUNTER — Other Ambulatory Visit (HOSPITAL_COMMUNITY): Payer: Self-pay | Admitting: Psychiatry

## 2018-05-25 DIAGNOSIS — F331 Major depressive disorder, recurrent, moderate: Secondary | ICD-10-CM

## 2018-05-29 ENCOUNTER — Other Ambulatory Visit (HOSPITAL_COMMUNITY): Payer: Self-pay | Admitting: Psychiatry

## 2018-05-29 DIAGNOSIS — F331 Major depressive disorder, recurrent, moderate: Secondary | ICD-10-CM

## 2018-05-30 ENCOUNTER — Encounter (INDEPENDENT_AMBULATORY_CARE_PROVIDER_SITE_OTHER): Payer: Medicare HMO | Admitting: Ophthalmology

## 2018-05-30 ENCOUNTER — Other Ambulatory Visit (HOSPITAL_COMMUNITY): Payer: Self-pay

## 2018-05-30 DIAGNOSIS — F331 Major depressive disorder, recurrent, moderate: Secondary | ICD-10-CM

## 2018-05-30 MED ORDER — CLONAZEPAM 1 MG PO TABS: ORAL_TABLET | ORAL | 0 refills | Status: DC

## 2018-06-19 ENCOUNTER — Other Ambulatory Visit (HOSPITAL_COMMUNITY): Payer: Self-pay | Admitting: Psychiatry

## 2018-06-19 DIAGNOSIS — F331 Major depressive disorder, recurrent, moderate: Secondary | ICD-10-CM

## 2018-06-23 ENCOUNTER — Other Ambulatory Visit (HOSPITAL_COMMUNITY): Payer: Self-pay

## 2018-06-23 DIAGNOSIS — F331 Major depressive disorder, recurrent, moderate: Secondary | ICD-10-CM

## 2018-06-23 MED ORDER — FLUOXETINE HCL 40 MG PO CAPS: 40.0000 mg | ORAL_CAPSULE | Freq: Every day | ORAL | 0 refills | Status: DC

## 2018-06-29 ENCOUNTER — Encounter (HOSPITAL_COMMUNITY): Payer: Self-pay | Admitting: Psychiatry

## 2018-06-29 ENCOUNTER — Ambulatory Visit (HOSPITAL_COMMUNITY): Payer: Medicare HMO | Admitting: Psychiatry

## 2018-06-29 VITALS — BP 110/63 | HR 90 | Ht 64.0 in | Wt 229.0 lb

## 2018-06-29 DIAGNOSIS — F411 Generalized anxiety disorder: Secondary | ICD-10-CM

## 2018-06-29 DIAGNOSIS — F331 Major depressive disorder, recurrent, moderate: Secondary | ICD-10-CM

## 2018-06-29 DIAGNOSIS — R69 Illness, unspecified: Secondary | ICD-10-CM | POA: Diagnosis not present

## 2018-06-29 DIAGNOSIS — F4310 Post-traumatic stress disorder, unspecified: Secondary | ICD-10-CM

## 2018-06-29 MED ORDER — PRAZOSIN HCL 5 MG PO CAPS: 5.0000 mg | ORAL_CAPSULE | Freq: Every day | ORAL | 0 refills | Status: DC

## 2018-06-29 MED ORDER — CLONAZEPAM 1 MG PO TABS: ORAL_TABLET | ORAL | 2 refills | Status: DC

## 2018-06-29 MED ORDER — LAMOTRIGINE 150 MG PO TABS: 150.0000 mg | ORAL_TABLET | Freq: Two times a day (BID) | ORAL | 0 refills | Status: DC

## 2018-06-29 MED ORDER — TRAZODONE HCL 300 MG PO TABS: 300.0000 mg | ORAL_TABLET | Freq: Every day | ORAL | 0 refills | Status: DC

## 2018-06-29 MED ORDER — FLUOXETINE HCL 40 MG PO CAPS: 40.0000 mg | ORAL_CAPSULE | Freq: Every day | ORAL | 0 refills | Status: DC

## 2018-06-29 NOTE — Progress Notes (Signed)
[place] MD/PA/NP OP Progress Note  06/29/2018 1:06 PM [name]  MRN:  [id number]  Chief Complaint:  I am pleased that my mother is doing better.  HPI: She came for her follow-up appointment.  She is pleased that her mother who diagnosed with cancer is now doing better after radiation and chemotherapy.  Patient is taking her medication.  Last visit we discontinue BuSpar because she was feeling tired and fatigued and we discussed polypharmacy.  Though she improved from the past but she still feels some time fatigue and lack of energy.  Patient present with chronic symptoms of depression and PTSD.  She still feels anxious and nervous.  But she feel the current medicine is working but there are days when she has nightmares, flashback and she feels very isolated and withdrawn.  She also endorsed marital issues from her husband who does not support her.  Patient denies any paranoia or any hallucination.  She endorsed chronic pain which she believe due to fibromyalgia.  She takes tramadol for that.  Patient like to continue her current medication which is keeping her stable.  Her energy level is fair.  Her appetite is okay.  She denies any major panic attack in recent weeks.  Visit Diagnosis:    ICD-10-CM   1. GAD (generalized anxiety disorder) F41.1 clonazePAM (KLONOPIN) 1 MG tablet  2. MDD (major depressive disorder), recurrent episode, moderate (HCC) F33.1 trazodone (DESYREL) 300 MG tablet    prazosin (MINIPRESS) 5 MG capsule    lamoTRIgine (LAMICTAL) 150 MG tablet    clonazePAM (KLONOPIN) 1 MG tablet    FLUoxetine (PROZAC) 40 MG capsule  3. PTSD (post-traumatic stress disorder) F43.10 prazosin (MINIPRESS) 5 MG capsule    Past Psychiatric History: Reviewed Patient has history of depression and anxiety most of her life. She was admitted 2 years ago at [facility] at hospital due to suicidal attempt after taking overdose on Valium and cut her wrist. She was seeing [facility]. She  remember taking Abilify and Risperdal and some other medication which she do not remember. Patient denies any history of psychosis, paranoia, mania but had history of nightmares and flashback.  Past Medical History:  Past Medical History:  Diagnosis Date  . Acid reflux disease   . Anxiety   . Asthma   . Bronchitis, chronic (HCC)    Effects worse with URI  . COPD (chronic obstructive pulmonary disease) ([place])   . Depression   . 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  . Diverticulitis   . Fibromyalgia   . Fibromyalgia   . GERD (gastroesophageal reflux disease)   . Headache(784.0)    migraine headache  . Kidney stones    at least 6 stones in past  . Restless leg syndrome    Bilateral  . Vomiting    last night at 2300 after last round of antibiotic    Past Surgical History:  Procedure Laterality Date  . ABDOMINAL HYSTERECTOMY    . APPENDECTOMY    . APPLICATION OF WOUND VAC N/A 11/01/2012   Procedure: APPLICATION OF WOUND VAC;  Surgeon: Joyice Faster. Cornett, MD;  [facility];  Service: General;  Laterality: N/A;  . CHOLECYSTECTOMY    . COLOSTOMY    . COLOSTOMY CLOSURE N/A 11/01/2012   Procedure: COLOSTOMY CLOSURE;  Surgeon: Joyice Faster. Cornett, MD;  [facility];  Service: General;  Laterality: N/A;  REPAIR PARASTOMAL HERNIA; REVISION OF COLOSTOMY  . COLOSTOMY CLOSURE N/A 05/03/2013  Procedure: COLOSTOMY CLOSURE;  Surgeon: Joyice Faster. Cornett, MD;  Location: [facility];  Service: General;  Laterality: N/A;  . ESOPHAGOGASTRODUODENOSCOPY Left 11/27/2012   Procedure: ESOPHAGOGASTRODUODENOSCOPY (EGD);  Surgeon: Winfield Cunas., MD;  Location: [facility];  Service: Gastroenterology;  Laterality: Left;  . INCISIONAL HERNIA REPAIR N/A 11/01/2012   Procedure: HERNIA REPAIR INCISIONAL;  Surgeon: Joyice Faster. Cornett, MD;  [facility];  Service: General;  Laterality: N/A;  . LAPAROTOMY  11/01/2012   Procedure: EXPLORATORY LAPAROTOMY;  Surgeon: Joyice Faster. Cornett, MD;   [facility];  Service: General;;  exploratory laparotomy,repair of parastomal hernia and incisional hernia, lysis of adhesions, and application of wound V.A.C.  . LYSIS OF ADHESION N/A 11/01/2012   Procedure: LYSIS OF ADHESION;  Surgeon: Joyice Faster. Cornett, MD;  [facility];  Service: General;  Laterality: N/A;  . PARASTOMAL HERNIA REPAIR N/A 11/01/2012   Procedure: HERNIA REPAIR PARASTOMAL;  Surgeon: Joyice Faster. Cornett, MD;  [facility];  Service: General;  Laterality: N/A;  . PARTIAL HYSTERECTOMY     partial    Family Psychiatric History: Reviewed  Family History:  Family History  Problem Relation Age of Onset  . Diabetes Mother   . Deep vein thrombosis Neg Hx   . Pulmonary embolism Neg Hx     Social History:  Social History   Socioeconomic History  . Marital status: Married    Spouse name: Not on file  . Number of children: Not on file  . Years of education: Not on file  . Highest education level: Not on file  Occupational History  . Not on file  Social Needs  . Financial resource strain: Not on file  . Food insecurity:    Worry: Not on file    Inability: Not on file  . Transportation needs:    Medical: Not on file    Non-medical: Not on file  Tobacco Use  . Smoking status: Former Smoker    Packs/day: 1.00    Types: Cigarettes  . Smokeless tobacco: Never Used  . Tobacco comment: about one pack a day on a bad day  Substance and Sexual Activity  . Alcohol use: No  . Drug use: No  . Sexual activity: Never    Birth control/protection: None  Lifestyle  . Physical activity:    Days per week: Not on file    Minutes per session: Not on file  . Stress: Not on file  Relationships  . Social connections:    Talks on phone: Not on file    Gets together: Not on file    Attends religious service: Not on file    Active member of club or organization: Not on file    Attends meetings of clubs or organizations: Not on file    Relationship status: Not on file   Other Topics Concern  . Not on file  Social History Narrative  . Not on file    Allergies:  Allergies  Allergen Reactions  . Tramadol     Per Dr. Sharyon Medicus is renal failure    Metabolic Disorder Labs: Lab Results  Component Value Date   HGBA1C 6.2 (H) 11/03/2012   MPG 131 (H) 11/03/2012   No results found for: PROLACTIN No results found for: CHOL, TRIG, HDL, CHOLHDL, VLDL, LDLCALC No results found for: TSH  Therapeutic Level Labs: No results found for: LITHIUM No results found for: VALPROATE No components found for:  CBMZ  Current Medications: Current Outpatient Medications  Medication Sig  Dispense Refill  . atorvastatin (LIPITOR) 40 MG tablet Take 40 mg by mouth daily at 6 PM.     . clonazePAM (KLONOPIN) 1 MG tablet Take 1 tab twice daily 60 tablet 0  . FLUoxetine (PROZAC) 40 MG capsule Take 1 capsule (40 mg total) by mouth daily. 90 capsule 0  . lamoTRIgine (LAMICTAL) 150 MG tablet Take 1 tablet (150 mg total) by mouth 2 (two) times daily. 180 tablet 0  . lisinopril (PRINIVIL,ZESTRIL) 20 MG tablet TK 1 T PO D  3  . metFORMIN (GLUCOPHAGE) 1000 MG tablet Take 1,000 mg by mouth 2 (two) times daily with a meal.     . omeprazole (PRILOSEC) 40 MG capsule TK 1 C PO D  6  . ondansetron (ZOFRAN) 4 MG tablet TK 1 T PO Q 8 H PRF NAUSEA  2  . prazosin (MINIPRESS) 5 MG capsule Take 1 capsule (5 mg total) by mouth at bedtime. 90 capsule 0  . promethazine (PHENERGAN) 25 MG tablet TAKE 1 TABLET BY MOUTH EVERY 8 HOURS AS NEEDED FOR NAUSEA AND VOMITING  3  . traMADol (ULTRAM) 50 MG tablet TK 1 T PO Q 8 H PRN P  2  . trazodone (DESYREL) 300 MG tablet Take 1 tablet (300 mg total) by mouth at bedtime. 90 tablet 0  . VENTOLIN HFA 108 (90 BASE) MCG/ACT inhaler Inhale 2 puffs into the lungs every 6 (six) hours as needed for wheezing or shortness of breath.      No current facility-administered medications for this visit.      Musculoskeletal: Strength & Muscle Tone: within  normal limits Gait & Station: normal Patient leans: N/A  Psychiatric Specialty Exam: ROS  Last menstrual period 04/20/2013.There is no height or weight on file to calculate BMI.  General Appearance: Casual  Eye Contact:  Fair  Speech:  Slow  Volume:  Normal  Mood:  Anxious and emotional  Affect:  Congruent  Thought Process:  Goal Directed  Orientation:  Full (Time, Place, and Person)  Thought Content: Rumination   Suicidal Thoughts:  No  Homicidal Thoughts:  No  Memory:  Immediate;   Good Recent;   Good Remote;   Good  Judgement:  Good  Insight:  Good  Psychomotor Activity:  Decreased  Concentration:  Concentration: Fair and Attention Span: Fair  Recall:  Good  Fund of Knowledge: Good  Language: Good  Akathisia:  No  Handed:  Right  AIMS (if indicated): not done  Assets:  Communication Skills Desire for Improvement Housing  ADL's:  Intact  Cognition: WNL  Sleep:  Fair   Screenings:   Assessment and Plan: Major depressive disorder, recurrent.  Generalized anxiety disorder.  Posttraumatic stress disorder.  Reassurance given.  Discussed psychosocial issues especially marital issues with her husband.  One more time encouraged to see a therapist and patient agree that she will look into that since she has financial concerns.  She has no rash, itching, tremors or shakes.  She wants to continue her current medication.  We also discussed polypharmacy at this time she does not want to change or reduce any medication.  Continue Klonopin 1 mg twice a day, Prozac 40 mg daily, trazodone 300 mg at bedtime, Lamictal 300 mg daily and Minipress 5 mg at bedtime.  Recommended to call us back if she has any question, concern if he feels worsening of the symptoms.  Patient seeing Dr. Manuella Ghazi at [facility] physician and she recently had blood work and she was told  everything is normal.  We will get records from her primary care physician.   Kathlee Nations, MD 06/29/2018, 1:06 PM

## 2018-07-07 DIAGNOSIS — M797 Fibromyalgia: Secondary | ICD-10-CM | POA: Diagnosis not present

## 2018-07-07 DIAGNOSIS — Z8719 Personal history of other diseases of the digestive system: Secondary | ICD-10-CM | POA: Diagnosis not present

## 2018-07-07 DIAGNOSIS — Z6841 Body Mass Index (BMI) 40.0 and over, adult: Secondary | ICD-10-CM | POA: Diagnosis not present

## 2018-07-07 DIAGNOSIS — R109 Unspecified abdominal pain: Secondary | ICD-10-CM | POA: Diagnosis not present

## 2018-07-12 ENCOUNTER — Other Ambulatory Visit: Payer: Self-pay | Admitting: Gastroenterology

## 2018-07-12 DIAGNOSIS — R109 Unspecified abdominal pain: Secondary | ICD-10-CM

## 2018-07-12 DIAGNOSIS — R197 Diarrhea, unspecified: Secondary | ICD-10-CM | POA: Diagnosis not present

## 2018-07-12 DIAGNOSIS — R1030 Lower abdominal pain, unspecified: Secondary | ICD-10-CM | POA: Diagnosis not present

## 2018-07-17 ENCOUNTER — Other Ambulatory Visit: Payer: Self-pay

## 2018-07-21 ENCOUNTER — Ambulatory Visit
Admission: RE | Admit: 2018-07-21 | Discharge: 2018-07-21 | Disposition: A | Discharge status: Home / Self Care | Payer: Medicare HMO | Source: Ambulatory Visit | Attending: Gastroenterology | Admitting: Gastroenterology

## 2018-07-21 ENCOUNTER — Other Ambulatory Visit: Payer: Self-pay

## 2018-07-21 DIAGNOSIS — K431 Incisional hernia with gangrene: Secondary | ICD-10-CM | POA: Diagnosis not present

## 2018-07-21 DIAGNOSIS — R109 Unspecified abdominal pain: Secondary | ICD-10-CM

## 2018-07-21 DIAGNOSIS — N281 Cyst of kidney, acquired: Secondary | ICD-10-CM | POA: Diagnosis not present

## 2018-07-21 DIAGNOSIS — K7689 Other specified diseases of liver: Secondary | ICD-10-CM | POA: Diagnosis not present

## 2018-07-21 MED ORDER — IOPAMIDOL (ISOVUE-300) INJECTION 61%
125.0000 mL | Freq: Once | INTRAVENOUS | Status: AC | PRN
  Administered 2018-07-21: 125 mL via INTRAVENOUS

## 2018-08-09 DIAGNOSIS — Z23 Encounter for immunization: Secondary | ICD-10-CM | POA: Diagnosis not present

## 2018-09-19 DIAGNOSIS — Z6838 Body mass index (BMI) 38.0-38.9, adult: Secondary | ICD-10-CM | POA: Diagnosis not present

## 2018-09-19 DIAGNOSIS — R112 Nausea with vomiting, unspecified: Secondary | ICD-10-CM | POA: Diagnosis not present

## 2018-09-19 DIAGNOSIS — R197 Diarrhea, unspecified: Secondary | ICD-10-CM | POA: Diagnosis not present

## 2018-09-19 DIAGNOSIS — R1084 Generalized abdominal pain: Secondary | ICD-10-CM | POA: Diagnosis not present

## 2018-09-21 DIAGNOSIS — R1084 Generalized abdominal pain: Secondary | ICD-10-CM | POA: Diagnosis not present

## 2018-09-21 DIAGNOSIS — R109 Unspecified abdominal pain: Secondary | ICD-10-CM | POA: Diagnosis not present

## 2018-09-22 ENCOUNTER — Other Ambulatory Visit: Payer: Self-pay | Admitting: Internal Medicine

## 2018-09-22 ENCOUNTER — Ambulatory Visit
Admission: RE | Admit: 2018-09-22 | Discharge: 2018-09-22 | Disposition: A | Discharge status: Home / Self Care | Payer: Medicare HMO | Source: Ambulatory Visit | Attending: Internal Medicine | Admitting: Internal Medicine

## 2018-09-22 DIAGNOSIS — R109 Unspecified abdominal pain: Secondary | ICD-10-CM

## 2018-09-22 DIAGNOSIS — K7689 Other specified diseases of liver: Secondary | ICD-10-CM | POA: Diagnosis not present

## 2018-09-22 DIAGNOSIS — R197 Diarrhea, unspecified: Secondary | ICD-10-CM | POA: Diagnosis not present

## 2018-09-22 MED ORDER — IOPAMIDOL (ISOVUE-300) INJECTION 61%
125.0000 mL | Freq: Once | INTRAVENOUS | Status: AC | PRN
  Administered 2018-09-22: 125 mL via INTRAVENOUS

## 2018-09-29 ENCOUNTER — Ambulatory Visit (INDEPENDENT_AMBULATORY_CARE_PROVIDER_SITE_OTHER): Payer: Medicare HMO | Admitting: Psychiatry

## 2018-09-29 DIAGNOSIS — R69 Illness, unspecified: Secondary | ICD-10-CM | POA: Diagnosis not present

## 2018-09-29 DIAGNOSIS — F331 Major depressive disorder, recurrent, moderate: Secondary | ICD-10-CM

## 2018-09-29 DIAGNOSIS — F411 Generalized anxiety disorder: Secondary | ICD-10-CM

## 2018-09-29 DIAGNOSIS — F4310 Post-traumatic stress disorder, unspecified: Secondary | ICD-10-CM

## 2018-09-29 MED ORDER — TRAZODONE HCL 300 MG PO TABS: 300.0000 mg | ORAL_TABLET | Freq: Every day | ORAL | 0 refills | Status: DC

## 2018-09-29 MED ORDER — PRAZOSIN HCL 5 MG PO CAPS: 5.0000 mg | ORAL_CAPSULE | Freq: Every day | ORAL | 0 refills | Status: DC

## 2018-09-29 MED ORDER — FLUOXETINE HCL 40 MG PO CAPS: 40.0000 mg | ORAL_CAPSULE | Freq: Every day | ORAL | 0 refills | Status: DC

## 2018-09-29 MED ORDER — CLONAZEPAM 1 MG PO TABS: ORAL_TABLET | ORAL | 2 refills | Status: DC

## 2018-09-29 MED ORDER — LAMOTRIGINE 150 MG PO TABS: 150.0000 mg | ORAL_TABLET | Freq: Two times a day (BID) | ORAL | 0 refills | Status: DC
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
## 2018-10-20 ENCOUNTER — Other Ambulatory Visit: Payer: Self-pay | Admitting: Internal Medicine

## 2018-10-20 DIAGNOSIS — F17201 Nicotine dependence, unspecified, in remission: Secondary | ICD-10-CM

## 2018-11-01 DIAGNOSIS — E7849 Other hyperlipidemia: Secondary | ICD-10-CM | POA: Diagnosis not present

## 2018-11-01 DIAGNOSIS — I10 Essential (primary) hypertension: Secondary | ICD-10-CM | POA: Diagnosis not present

## 2018-11-01 DIAGNOSIS — F319 Bipolar disorder, unspecified: Secondary | ICD-10-CM | POA: Diagnosis not present

## 2018-11-01 DIAGNOSIS — R1084 Generalized abdominal pain: Secondary | ICD-10-CM | POA: Diagnosis not present

## 2018-11-01 DIAGNOSIS — E1169 Type 2 diabetes mellitus with other specified complication: Secondary | ICD-10-CM | POA: Diagnosis not present

## 2018-11-01 DIAGNOSIS — J449 Chronic obstructive pulmonary disease, unspecified: Secondary | ICD-10-CM | POA: Diagnosis not present

## 2018-11-01 DIAGNOSIS — F3341 Major depressive disorder, recurrent, in partial remission: Secondary | ICD-10-CM | POA: Diagnosis not present

## 2018-11-01 DIAGNOSIS — Z6839 Body mass index (BMI) 39.0-39.9, adult: Secondary | ICD-10-CM | POA: Diagnosis not present

## 2018-11-01 DIAGNOSIS — E781 Pure hyperglyceridemia: Secondary | ICD-10-CM | POA: Diagnosis not present

## 2018-11-01 DIAGNOSIS — I700 Atherosclerosis of aorta: Secondary | ICD-10-CM | POA: Diagnosis not present

## 2018-11-06 ENCOUNTER — Inpatient Hospital Stay: Admission: RE | Admit: 2018-11-06 | Payer: Self-pay | Source: Ambulatory Visit

## 2018-11-08 ENCOUNTER — Other Ambulatory Visit: Payer: Self-pay | Admitting: Obstetrics & Gynecology

## 2018-11-08 DIAGNOSIS — R109 Unspecified abdominal pain: Secondary | ICD-10-CM | POA: Diagnosis not present

## 2018-11-08 DIAGNOSIS — C541 Malignant neoplasm of endometrium: Secondary | ICD-10-CM | POA: Diagnosis not present

## 2018-11-08 DIAGNOSIS — Z01419 Encounter for gynecological examination (general) (routine) without abnormal findings: Secondary | ICD-10-CM | POA: Diagnosis not present

## 2018-11-08 DIAGNOSIS — N950 Postmenopausal bleeding: Secondary | ICD-10-CM | POA: Diagnosis not present

## 2018-11-08 DIAGNOSIS — Z124 Encounter for screening for malignant neoplasm of cervix: Secondary | ICD-10-CM | POA: Diagnosis not present

## 2018-11-10 ENCOUNTER — Ambulatory Visit
Admission: RE | Admit: 2018-11-10 | Discharge: 2018-11-10 | Disposition: A | Discharge status: Home / Self Care | Payer: HMO | Source: Ambulatory Visit | Attending: Internal Medicine | Admitting: Internal Medicine

## 2018-11-10 DIAGNOSIS — F17210 Nicotine dependence, cigarettes, uncomplicated: Secondary | ICD-10-CM | POA: Diagnosis not present

## 2018-11-10 DIAGNOSIS — F17201 Nicotine dependence, unspecified, in remission: Secondary | ICD-10-CM

## 2018-11-14 ENCOUNTER — Telehealth: Payer: Self-pay | Admitting: *Deleted

## 2018-11-14 NOTE — Telephone Encounter (Signed)
Called and spoke with the patient regarding her referral from Dr. Benjie Karvonen. Patient scheduled to see Dr. [name] tomorrow

## 2018-11-15 ENCOUNTER — Encounter: Payer: Self-pay | Admitting: Gynecologic Oncology

## 2018-11-15 ENCOUNTER — Inpatient Hospital Stay: Payer: HMO | Attending: Gynecologic Oncology | Admitting: Gynecologic Oncology

## 2018-11-15 VITALS — BP 131/66 | HR 106 | Temp 98.0°F | Resp 20 | Ht 62.0 in | Wt 222.0 lb

## 2018-11-15 DIAGNOSIS — Z6841 Body Mass Index (BMI) 40.0 and over, adult: Secondary | ICD-10-CM | POA: Insufficient documentation

## 2018-11-15 DIAGNOSIS — E119 Type 2 diabetes mellitus without complications: Secondary | ICD-10-CM | POA: Insufficient documentation

## 2018-11-15 DIAGNOSIS — J449 Chronic obstructive pulmonary disease, unspecified: Secondary | ICD-10-CM | POA: Insufficient documentation

## 2018-11-15 DIAGNOSIS — K432 Incisional hernia without obstruction or gangrene: Secondary | ICD-10-CM

## 2018-11-15 DIAGNOSIS — F17210 Nicotine dependence, cigarettes, uncomplicated: Secondary | ICD-10-CM | POA: Insufficient documentation

## 2018-11-15 DIAGNOSIS — K219 Gastro-esophageal reflux disease without esophagitis: Secondary | ICD-10-CM | POA: Diagnosis not present

## 2018-11-15 DIAGNOSIS — C541 Malignant neoplasm of endometrium: Secondary | ICD-10-CM | POA: Insufficient documentation

## 2018-11-15 NOTE — Patient Instructions (Signed)
We will arrange for you to meet with Dr. Neysa Bonito, General Surgeon with [facility] to discuss hernia repair at the same time as your endometrial cancer surgery.  After you have met with Dr. Johney Maine, we will arrange for you to come back to the office to meet with Lenna Sciara, NP for a pre-op appointment to discuss surgery in more detail.  Some information that will be discussed is below.   Preparing for your Surgery  Plan for surgery (date to be determined) with Dr. [name] at [place] will be scheduled for a robotic assisted total hysterectomy, left salpingo-oophorectomy, sentinel lymph node biopsy, lysis of adhesions, possible laparotomy in addition to Dr. Clyda Greener procedure.   Pre-operative Testing -You will receive a phone call from presurgical testing at [facility] if you have not received a call already to arrange for a pre-operative testing appointment before your surgery.  This appointment normally occurs one to two weeks before your scheduled surgery.   -Bring your insurance card, copy of an advanced directive if applicable, medication list  -At that visit, you will be asked to sign a consent for a possible blood transfusion in case a transfusion becomes necessary during surgery.  The need for a blood transfusion is rare but having consent is a necessary part of your care.     -You should not be taking blood thinners or aspirin at least ten days prior to surgery unless instructed by your surgeon.  Day Before Surgery at [place] will be asked to take in a light diet the day before surgery.  Avoid carbonated beverages.  You will be advised to have nothing to eat or drink after midnight the evening before.    Eat a light diet the day before surgery.  Examples including soups, broths, toast, yogurt, mashed potatoes.  Things to avoid include carbonated beverages (fizzy beverages), raw fruits and raw vegetables, or beans.   If your bowels are filled  with gas, your surgeon will have difficulty visualizing your pelvic organs which increases your surgical risks.  Starting at 4pm the day before surgery, begin drinking two bottles of magnesium citrate and only take in clear liquids after that time.  Your role in recovery Your role is to become active as soon as directed by your doctor, while still giving yourself time to heal.  Rest when you feel tired. You will be asked to do the following in order to speed your recovery:  - Cough and breathe deeply. This helps toclear and expand your lungs and can prevent pneumonia. You may be given a spirometer to practice deep breathing. A staff member will show you how to use the spirometer. - Do mild physical activity. Walking or moving your legs help your circulation and body functions return to normal. A staff member will help you when you try to walk and will provide you with simple exercises. Do not try to get up or walk alone the first time. - Actively manage your pain. Managing your pain lets you move in comfort. We will ask you to rate your pain on a scale of zero to 10. It is your responsibility to tell your doctor or nurse where and how much you hurt so your pain can be treated.  Special Considerations -If you are diabetic, you may be placed on insulin after surgery to have closer control over your blood sugars to promote healing and recovery.  This does not mean that you will be discharged on  insulin.  If applicable, your oral antidiabetics will be resumed when you are tolerating a solid diet.  -Your final pathology results from surgery should be available around one week after surgery and the results will be relayed to you when available.  -Dr. Lahoma Crocker is the Surgeon that assists your GYN Oncologist with surgery.  The next day after your surgery you will either see Dr. [name] or Dr. Lahoma Crocker.  -FMLA forms can be faxed to [phone] and please allow 5-7 business days for  completion.   Blood Transfusion Information WHAT IS A BLOOD TRANSFUSION? A transfusion is the replacement of blood or some of its parts. Blood is made up of multiple cells which provide different functions.  Red blood cells carry oxygen and are used for blood loss replacement.  White blood cells fight against infection.  Platelets control bleeding.  Plasma helps clot blood.  Other blood products are available for specialized needs, such as hemophilia or other clotting disorders. BEFORE THE TRANSFUSION  Who gives blood for transfusions?   You may be able to donate blood to be used at a later date on yourself (autologous donation).  Relatives can be asked to donate blood. This is generally not any safer than if you have received blood from a stranger. The same precautions are taken to ensure safety when a relative's blood is donated.  Healthy volunteers who are fully evaluated to make sure their blood is safe. This is blood bank blood. Transfusion therapy is the safest it has ever been in the practice of medicine. Before blood is taken from a donor, a complete history is taken to make sure that person has no history of diseases nor engages in risky social behavior (examples are intravenous drug use or sexual activity with multiple partners). The donor's travel history is screened to minimize risk of transmitting infections, such as malaria. The donated blood is tested for signs of infectious diseases, such as HIV and hepatitis. The blood is then tested to be sure it is compatible with you in order to minimize the chance of a transfusion reaction. If you or a relative donates blood, this is often done in anticipation of surgery and is not appropriate for emergency situations. It takes many days to process the donated blood. RISKS AND COMPLICATIONS Although transfusion therapy is very safe and saves many lives, the main dangers of transfusion include:   Getting an infectious  disease.  Developing a transfusion reaction. This is an allergic reaction to something in the blood you were given. Every precaution is taken to prevent this. The decision to have a blood transfusion has been considered carefully by your caregiver before blood is given. Blood is not given unless the benefits outweigh the risks.

## 2018-11-15 NOTE — Progress Notes (Signed)
Consult Note: Gyn-Onc  Consult was requested by Dr. Benjie Karvonen for the evaluation of [name] 58 y.o. female  CC:  Chief Complaint  Patient presents with  . endometrial cancer grade 3    Assessment/Plan:  [name]  is a 58 y.o.  year old with grade 3 endometrial cancer, a complex incisional abdominal hernia, obesity (BMI 40kg/m2) and complex medical and surgical history.  I discussed that the preferred treatment for endometrial cancer is a hysterectomy with BSO (in her case LSO as the right has already been removed), and SLN biopsy. We prefer to perform this minimally invasively as this has been associated with improved perioperative outcomes. However, I discussed that her past surgical history with known bad adhesive disease and ventral hernia place her at a higher risk for needing conversion to laparotomy which would be associated with increased risk of visceral injury, blood loss, VTE risk, infection risk and wound healing risk.  I discussed that we will need to coordinate her surgery with a general surgeon from [place] to assist in hernia repair and lysis of adhesions. We will work on facilitating this and schedule a surgical date when this can be coordinated.  I will bring her back for a preoperative visit with our NP, Melissa Cross prior to her surgery.   I offered non-operative treatment with primary radiation, however, this is less definitive and associated with radiation toxicity. We typically reserve this for non-operative cases. While her surgical risk is very high, I would not deem her inoperable.   HPI: [name] is a 58 year old P0 who is seen in consultation at the request of Dr Benjie Karvonen for grade 3 endometrial cancer.    The patient reports history of stomach pains for approximately 2 years.  She feels that these began after her last surgery for hernias and reversal of a Hartman's pouch.  She also reports intermittent vaginal spotting for at least 2 years.  Due to  her persistent cramping abdominal pain she underwent a CT scan of the abdomen and pelvis on September 22, 2018.  This revealed no acute findings or explanation for the patient's symptoms.  There is a stable periumbilical hernia containing small bowel but no evidence of bowel incarceration or obstruction.  Uterine fibroids were present with prominence of the endometrium for age.  She was then seen by her gynecologist who performed an endometrial [place] biopsy on 30 19th 2020.  This revealed FIGO grade 3 endometrioid adenocarcinoma.  P53 negative stains.  ER and PR were negative.  The patient has a complex medical history including history of COPD and asthma from a smoking history.  She also has a history of diabetes mellitus type 2.  Her last hemoglobin A1c in January 2020 was normal at 6.3.  She has chronic pain and takes Ultram and Klonopin.  She is morbidly obese with a BMI of 40 kg/m.  Her surgical history is also complex.  She has a remote history of a cholecystectomy.  When she was a 76 year old child she underwent an exploratory laparotomy with right salpingo-oophorectomy for an ovarian cyst.   In 2010 she experienced ruptured diverticulitis and a Hartman's procedure was performed at [facility].  She developed a parastomal hernia with small bowel obstruction and incarceration in February 2014 and was taken to the operating room at [facility].  The operative note reveals 1 hour of complex adhesio lysis and primary repair of the hernia.  She was in the ICU with pneumonia for  2 weeks following the procedure.  In August 2014 she was taken back to the operating room for a laparotomy and extensive lysis of adhesions again and takedown of the ostomy with 3 anastomosis and closure of the parastomal hernia and ventral abdominal hernia primarily (no mesh was used).  Postoperatively from the surgery she developed surgical site infection in the incision with wound breakdown requiring a wound VAC.  Current Meds:   Outpatient Encounter Medications as of 11/15/2018  Medication Sig  . atorvastatin (LIPITOR) 40 MG tablet Take 40 mg by mouth daily at 6 PM.   . clonazePAM (KLONOPIN) 1 MG tablet Take 1 tab twice daily (Patient taking differently: Take 1 mg by mouth 2 (two) times daily. Take 1 tab twice daily)  . dicyclomine (BENTYL) 10 MG capsule Take 10 mg by mouth as needed for spasms.  Marland Kitchen FLUoxetine (PROZAC) 40 MG capsule Take 1 capsule (40 mg total) by mouth daily.  Marland Kitchen lamoTRIgine (LAMICTAL) 150 MG tablet Take 1 tablet (150 mg total) by mouth 2 (two) times daily.  Marland Kitchen lisinopril (PRINIVIL,ZESTRIL) 20 MG tablet TK 1 T PO D  . metFORMIN (GLUCOPHAGE) 1000 MG tablet Take 1,000 mg by mouth 2 (two) times daily with a meal.   . omeprazole (PRILOSEC) 40 MG capsule TK 1 C PO D  . ondansetron (ZOFRAN) 4 MG tablet TK 1 T PO Q 8 H PRF NAUSEA  . prazosin (MINIPRESS) 5 MG capsule Take 1 capsule (5 mg total) by mouth at bedtime.  . traMADol (ULTRAM) 50 MG tablet Take 100 mg by mouth as needed. Patient states she takes two pills every 8 hours as needed for pain.  . trazodone (DESYREL) 300 MG tablet Take 1 tablet (300 mg total) by mouth at bedtime.  . VENTOLIN HFA 108 (90 BASE) MCG/ACT inhaler Inhale 2 puffs into the lungs every 6 (six) hours as needed for wheezing or shortness of breath.   . [DISCONTINUED] promethazine (PHENERGAN) 25 MG tablet TAKE 1 TABLET BY MOUTH EVERY 8 HOURS AS NEEDED FOR NAUSEA AND VOMITING   No facility-administered encounter medications on file as of 11/15/2018.     Allergy:  Allergies  Allergen Reactions  . Tramadol     Per Dr. Sharyon Medicus is renal failure, patient states that she is currently taking this medication and she has not had a reaction to Tramadol since this time.    Social Hx:   Social History   Socioeconomic History  . Marital status: Married    Spouse name: Not on file  . Number of children: Not on file  . Years of education: Not on file  . Highest education level:  Not on file  Occupational History  . Not on file  Social Needs  . Financial resource strain: Not on file  . Food insecurity:    Worry: Not on file    Inability: Not on file  . Transportation needs:    Medical: Not on file    Non-medical: Not on file  Tobacco Use  . Smoking status: Current Some Day Smoker    Packs/day: 0.50    Types: Cigarettes  . Smokeless tobacco: Never Used  . Tobacco comment: about one pack a day on a bad day  Substance and Sexual Activity  . Alcohol use: No  . Drug use: No  . Sexual activity: Never    Birth control/protection: None  Lifestyle  . Physical activity:    Days per week: Not on file    Minutes per session: Not  on file  . Stress: Not on file  Relationships  . Social connections:    Talks on phone: Not on file    Gets together: Not on file    Attends religious service: Not on file    Active member of club or organization: Not on file    Attends meetings of clubs or organizations: Not on file    Relationship status: Not on file  . Intimate partner violence:    Fear of current or ex partner: Not on file    Emotionally abused: Not on file    Physically abused: Not on file    Forced sexual activity: Not on file  Other Topics Concern  . Not on file  Social History Narrative  . Not on file    Past Surgical Hx:  Past Surgical History:  Procedure Laterality Date  . ABDOMINAL HYSTERECTOMY    . APPENDECTOMY    . APPLICATION OF WOUND VAC N/A 11/01/2012   Procedure: APPLICATION OF WOUND VAC;  Surgeon: Joyice Faster. Cornett, MD;  Location: [facility];  Service: General;  Laterality: N/A;  . CHOLECYSTECTOMY    . COLOSTOMY    . COLOSTOMY CLOSURE N/A 11/01/2012   Procedure: COLOSTOMY CLOSURE;  Surgeon: Joyice Faster. Cornett, MD;  Location: [facility];  Service: General;  Laterality: N/A;  REPAIR PARASTOMAL HERNIA; REVISION OF COLOSTOMY  . COLOSTOMY CLOSURE N/A 05/03/2013   Procedure: COLOSTOMY CLOSURE;  Surgeon: Joyice Faster. Cornett, MD;  Location: [facility];  Service:  General;  Laterality: N/A;  . ESOPHAGOGASTRODUODENOSCOPY Left 11/27/2012   Procedure: ESOPHAGOGASTRODUODENOSCOPY (EGD);  Surgeon: Winfield Cunas., MD;  Location: [facility];  Service: Gastroenterology;  Laterality: Left;  . INCISIONAL HERNIA REPAIR N/A 11/01/2012   Procedure: HERNIA REPAIR INCISIONAL;  Surgeon: Joyice Faster. Cornett, MD;  Location: [facility];  Service: General;  Laterality: N/A;  . LAPAROTOMY  11/01/2012   Procedure: EXPLORATORY LAPAROTOMY;  Surgeon: Joyice Faster. Cornett, MD;  Location: [facility];  Service: General;;  exploratory laparotomy,repair of parastomal hernia and incisional hernia, lysis of adhesions, and application of wound V.A.C.  . LYSIS OF ADHESION N/A 11/01/2012   Procedure: LYSIS OF ADHESION;  Surgeon: Joyice Faster. Cornett, MD;  Location: [facility];  Service: General;  Laterality: N/A;  . PARASTOMAL HERNIA REPAIR N/A 11/01/2012   Procedure: HERNIA REPAIR PARASTOMAL;  Surgeon: Joyice Faster. Cornett, MD;  Location: [facility];  Service: General;  Laterality: N/A;  . PARTIAL HYSTERECTOMY     partial    Past Medical Hx:  Past Medical History:  Diagnosis Date  . Acid reflux disease   . Anxiety   . Asthma   . Bronchitis, chronic (HCC)    Effects worse with URI  . COPD (chronic obstructive pulmonary disease) ([place])   . Depression   . 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, type 2  . Diverticulitis   . Dysplasia of cervix (uteri)    patient states she had dysplasia of the uterus stage 4  . Fibromyalgia   . Fibromyalgia   . GERD (gastroesophageal reflux disease)   . Headache(784.0)    migraine headache  . Kidney stones    at least 6 stones in past  . Pneumonia 2019  . Restless leg syndrome    Bilateral  . Vomiting    last night at 2300 after last round of antibiotic    Past Gynecological History:  G0, no history of abnormal paps.  Patient's last menstrual period was 04/20/2013.  Family Hx:  Family  History  Problem Relation Age of Onset  . Diabetes  Mother   . Throat cancer Mother   . Uterine cancer Sister   . Deep vein thrombosis Neg Hx   . Pulmonary embolism Neg Hx     Review of Systems:  Constitutional  Feels well,    ENT Normal appearing ears and nares bilaterally Skin/Breast  No rash, sores, jaundice, itching, dryness Cardiovascular  No chest pain, shortness of breath, or edema  Pulmonary  No cough or wheeze.  Gastro Intestinal  No nausea, vomitting, or diarrhoea. No bright red blood per rectum, no abdominal pain, change in bowel movement, or constipation.  Genito Urinary  No frequency, urgency, dysuria, + postmenopausal bleeding.  Musculo Skeletal  No myalgia, arthralgia, joint swelling or pain  Neurologic  No weakness, numbness, change in gait,  Psychology  No depression, anxiety, insomnia.   Vitals:  Blood pressure 131/66, pulse (!) 106, temperature 98 F (36.7 C), temperature source Oral, resp. rate 20, height '5\' 2"'  (1.575 m), weight 222 lb (100.7 kg), last menstrual period 04/20/2013, SpO2 96 %.  Physical Exam: WD in NAD Neck  Supple NROM, without any enlargements.  Lymph Node Survey No cervical supraclavicular or inguinal adenopathy Cardiovascular  Pulse normal rate, regularity and rhythm. S1 and S2 normal.  Lungs  Clear to auscultation bilateraly, without wheezes/crackles/rhonchi. Good air movement.  Skin  No rash/lesions/breakdown  Psychiatry  Alert and oriented to person, place, and time  Abdomen  Normoactive bowel sounds, abdomen soft, non-tender and obese with tenderness in right lower/mid abdomen consistent with hernia. Complex abdominal incisions with skin puckering.  Back No CVA tenderness Genito Urinary  Vulva/vagina: Normal external female genitalia.  No lesions. No discharge or bleeding.  Bladder/urethra:  No lesions or masses, well supported bladder  Vagina: normal  Cervix: Normal appearing, no lesions.  Uterus:  Bulky, mobile, no parametrial involvement or nodularity.  Adnexa: no  palpable masses. Rectal  deferred Extremities  No bilateral cyanosis, clubbing or edema.   Thereasa Solo, MD  11/15/2018, 1:09 PM

## 2018-11-16 ENCOUNTER — Telehealth: Payer: Self-pay | Admitting: *Deleted

## 2018-11-16 NOTE — Telephone Encounter (Signed)
Fax referral and records to [facility] for Dr. Johney Maine for a joint case

## 2018-11-28 ENCOUNTER — Ambulatory Visit: Payer: Self-pay | Admitting: Surgery

## 2018-11-28 ENCOUNTER — Telehealth: Payer: Self-pay | Admitting: *Deleted

## 2018-11-28 ENCOUNTER — Telehealth: Payer: Self-pay | Admitting: Oncology

## 2018-11-28 DIAGNOSIS — K432 Incisional hernia without obstruction or gangrene: Secondary | ICD-10-CM | POA: Diagnosis not present

## 2018-11-28 DIAGNOSIS — Z720 Tobacco use: Secondary | ICD-10-CM | POA: Diagnosis not present

## 2018-11-28 NOTE — Telephone Encounter (Signed)
Elmo Putt from [facility] called regarding the referral and joint surgery case. Elmo Putt left a message stating that Dr. Johney Maine wants the patient to have cardiology clearance before her surgery. Appt with Dr. Harrell Gave on 4/6. Melissa APP and Dr. [name] notified

## 2018-11-28 NOTE — Telephone Encounter (Signed)
Left a message for patient regarding appointment with Dr. Gwenlyn Found on 12/12/18 for cardiac clearance.  Requested a return call.

## 2018-11-29 NOTE — Telephone Encounter (Signed)
[name] called back and confirmed appointment with Dr. Gwenlyn Found on 12/12/18 at 9:45 am.

## 2018-12-01 DIAGNOSIS — F321 Major depressive disorder, single episode, moderate: Secondary | ICD-10-CM | POA: Diagnosis not present

## 2018-12-01 DIAGNOSIS — I10 Essential (primary) hypertension: Secondary | ICD-10-CM | POA: Diagnosis not present

## 2018-12-01 DIAGNOSIS — H9201 Otalgia, right ear: Secondary | ICD-10-CM | POA: Diagnosis not present

## 2018-12-08 ENCOUNTER — Telehealth: Payer: Self-pay | Admitting: Oncology

## 2018-12-08 ENCOUNTER — Telehealth: Payer: Self-pay | Admitting: *Deleted

## 2018-12-08 NOTE — Telephone Encounter (Signed)
Patient called and stated "with everything going on would it be better for me to just start radiation and then have surgery?" Explained I would give the message to [facility] APP and we would call her back.

## 2018-12-08 NOTE — Telephone Encounter (Signed)
Called [place] and advised her that Dr. [name] is not recommending radiation before surgery because it makes surgery more risky.  [place] verbalized understanding and agreement.

## 2018-12-12 ENCOUNTER — Encounter: Payer: Self-pay | Admitting: Cardiovascular Disease

## 2018-12-12 ENCOUNTER — Ambulatory Visit: Payer: HMO | Admitting: Cardiovascular Disease

## 2018-12-12 ENCOUNTER — Other Ambulatory Visit: Payer: Self-pay

## 2018-12-12 VITALS — BP 120/70 | HR 79 | Ht 64.0 in | Wt 231.0 lb

## 2018-12-12 DIAGNOSIS — Z01818 Encounter for other preprocedural examination: Secondary | ICD-10-CM | POA: Insufficient documentation

## 2018-12-12 DIAGNOSIS — E782 Mixed hyperlipidemia: Secondary | ICD-10-CM

## 2018-12-12 DIAGNOSIS — F17200 Nicotine dependence, unspecified, uncomplicated: Secondary | ICD-10-CM | POA: Diagnosis not present

## 2018-12-12 DIAGNOSIS — I10 Essential (primary) hypertension: Secondary | ICD-10-CM | POA: Diagnosis not present

## 2018-12-12 DIAGNOSIS — R072 Precordial pain: Secondary | ICD-10-CM

## 2018-12-12 DIAGNOSIS — E785 Hyperlipidemia, unspecified: Secondary | ICD-10-CM | POA: Insufficient documentation

## 2018-12-12 NOTE — Assessment & Plan Note (Signed)
[name] was referred to me by Dr. Johney Maine for preoperative clearance before complex hernia and endometrial cancer surgery.  Her cardiac risk factor profile is notable for 45-pack-year tobacco abuse, treated hypertension, diabetes and hyperlipidemia.  She is never had a heart attack or stroke.  There is no family history for heart disease.  She does have an anxiety disorder and complains of chest pain lasting up to 15 minutes at a time 3-4 times a week with occasional left upper extremity radiation.  This is been going on for over a year.  She also has fibromyalgia and chronic pain syndrome.  I do not think she has the ability to exercise but feels she should have functional testing to screen her for high risk CAD in light of her upcoming fairly complex surgical procedure.  I am going to order a pharmacologic Myoview stress test to further evaluate and risk stratify.

## 2018-12-12 NOTE — Assessment & Plan Note (Signed)
45-pack-year tobacco abuse currently smoking 1 pack/day.

## 2018-12-12 NOTE — Progress Notes (Signed)
12/12/2018 CHARLANE Braun   [birth year]  [id number]  Primary Physician Marton Redwood, MD Primary Cardiologist: Lorretta Harp MD Lupe Carney, [place]  HPI:  [name] is a 58 y.o. moderately overweight married Caucasian female with no children referred by Dr. Alwyn Pea, general surgeon, for preoperative clearance before complex hernia and gynecologic surgery for endometrial cancer.  Her cardiac risk factor profile is notable for 45 pack years of tobacco abuse currently smoking 1 pack/day, treated hypertension, diabetes and hyperlipidemia.  There is no family history of heart disease.  She is never had a heart attack or stroke.  She does have a anxiety disorder as well as fibromyalgia and chronic pain syndrome.  She was recently diagnosed with endometrial cancer and apparently needs a combined GYN and hernia repair surgery.  She gets chest pain on a daily basis sometimes lasting up to 10 to 15 minutes at a time which she says is related to her anxiety.   Current Meds  Medication Sig  . atorvastatin (LIPITOR) 40 MG tablet Take 40 mg by mouth daily at 6 PM.   . clonazePAM (KLONOPIN) 1 MG tablet Take 1 tab twice daily (Patient taking differently: Take 1 mg by mouth 2 (two) times daily. Take 1 tab twice daily)  . dicyclomine (BENTYL) 10 MG capsule Take 10 mg by mouth as needed for spasms.  Marland Kitchen FLUoxetine (PROZAC) 40 MG capsule Take 1 capsule (40 mg total) by mouth daily.  Marland Kitchen HYDROcodone-acetaminophen (NORCO/VICODIN) 5-325 MG tablet Take 1 tablet by mouth every 6 (six) hours as needed for moderate pain.  Marland Kitchen lamoTRIgine (LAMICTAL) 150 MG tablet Take 1 tablet (150 mg total) by mouth 2 (two) times daily.  Marland Kitchen lisinopril (PRINIVIL,ZESTRIL) 20 MG tablet TK 1 T PO D  . metFORMIN (GLUCOPHAGE) 1000 MG tablet Take 1,000 mg by mouth 2 (two) times daily with a meal.   . omeprazole (PRILOSEC) 40 MG capsule TK 1 C PO D  . ondansetron (ZOFRAN) 8 MG tablet Take 8 mg by mouth as directed.   .  prazosin (MINIPRESS) 5 MG capsule Take 1 capsule (5 mg total) by mouth at bedtime.  . traMADol (ULTRAM) 50 MG tablet Take 100 mg by mouth as needed. Patient states she takes two pills every 8 hours as needed for pain.  . trazodone (DESYREL) 300 MG tablet Take 1 tablet (300 mg total) by mouth at bedtime.  . VENTOLIN HFA 108 (90 BASE) MCG/ACT inhaler Inhale 2 puffs into the lungs every 6 (six) hours as needed for wheezing or shortness of breath.      Allergies  Allergen Reactions  . Tramadol     Per Dr. Sharyon Medicus is renal failure, patient states that she is currently taking this medication and she has not had a reaction to Tramadol since this time.    Social History   Socioeconomic History  . Marital status: Married    Spouse name: Not on file  . Number of children: Not on file  . Years of education: Not on file  . Highest education level: Not on file  Occupational History  . Not on file  Social Needs  . Financial resource strain: Not on file  . Food insecurity:    Worry: Not on file    Inability: Not on file  . Transportation needs:    Medical: Not on file    Non-medical: Not on file  Tobacco Use  . Smoking status: Current Some Day Smoker  Packs/day: 0.50    Types: Cigarettes  . Smokeless tobacco: Never Used  . Tobacco comment: about one pack a day on a bad day  Substance and Sexual Activity  . Alcohol use: No  . Drug use: No  . Sexual activity: Never    Birth control/protection: None  Lifestyle  . Physical activity:    Days per week: Not on file    Minutes per session: Not on file  . Stress: Not on file  Relationships  . Social connections:    Talks on phone: Not on file    Gets together: Not on file    Attends religious service: Not on file    Active member of club or organization: Not on file    Attends meetings of clubs or organizations: Not on file    Relationship status: Not on file  . Intimate partner violence:    Fear of current or ex partner:  Not on file    Emotionally abused: Not on file    Physically abused: Not on file    Forced sexual activity: Not on file  Other Topics Concern  . Not on file  Social History Narrative  . Not on file     Review of Systems: General: negative for chills, fever, night sweats or weight changes.  Cardiovascular: negative for chest pain, dyspnea on exertion, edema, orthopnea, palpitations, paroxysmal nocturnal dyspnea or shortness of breath Dermatological: negative for rash Respiratory: negative for cough or wheezing Urologic: negative for hematuria Abdominal: negative for nausea, vomiting, diarrhea, bright red blood per rectum, melena, or hematemesis Neurologic: negative for visual changes, syncope, or dizziness All other systems reviewed and are otherwise negative except as noted above.    Blood pressure 120/70, pulse 79, height 5\' 4"  (1.626 m), weight 231 lb (104.8 kg), last menstrual period 04/20/2013.  General appearance: alert and no distress Neck: no adenopathy, no carotid bruit, no JVD, supple, symmetrical, trachea midline and thyroid not enlarged, symmetric, no tenderness/mass/nodules Lungs: clear to auscultation bilaterally Heart: regular rate and rhythm, S1, S2 normal, no murmur, click, rub or gallop Extremities: extremities normal, atraumatic, no cyanosis or edema Pulses: 2+ and symmetric Skin: Skin color, texture, turgor normal. No rashes or lesions Neurologic: Alert and oriented X 3, normal strength and tone. Normal symmetric reflexes. Normal coordination and gait  EKG normal sinus rhythm at 79 without ST or T wave changes.  I personally reviewed this EKG.  ASSESSMENT AND PLAN:   Smoking 45-pack-year tobacco abuse currently smoking 1 pack/day.  Essential hypertension History of essential hypertension with blood pressure measured today at 120/70.  She is on lisinopril and prazosin.  Continue current meds at current dosing  Hyperlipidemia History of hyperlipidemia on  statin therapy with lipid profile performed 04/28/2018 revealing total cholesterol 147, LDL 72 and HDL 42.  Preoperative clearance [name] was referred to me by Dr. Johney Maine for preoperative clearance before complex hernia and endometrial cancer surgery.  Her cardiac risk factor profile is notable for 45-pack-year tobacco abuse, treated hypertension, diabetes and hyperlipidemia.  She is never had a heart attack or stroke.  There is no family history for heart disease.  She does have an anxiety disorder and complains of chest pain lasting up to 15 minutes at a time 3-4 times a week with occasional left upper extremity radiation.  This is been going on for over a year.  She also has fibromyalgia and chronic pain syndrome.  I do not think she has the ability to exercise but  feels she should have functional testing to screen her for high risk CAD in light of her upcoming fairly complex surgical procedure.  I am going to order a pharmacologic Myoview stress test to further evaluate and risk stratify.      Lorretta Harp MD FACP,FACC,FAHA, [facility] 12/12/2018 9:51 AM

## 2018-12-12 NOTE — Assessment & Plan Note (Signed)
History of essential hypertension with blood pressure measured today at 120/70.  She is on lisinopril and prazosin.  Continue current meds at current dosing

## 2018-12-12 NOTE — Assessment & Plan Note (Signed)
History of hyperlipidemia on statin therapy with lipid profile performed 04/28/2018 revealing total cholesterol 147, LDL 72 and HDL 42.

## 2018-12-12 NOTE — Patient Instructions (Signed)
Medication Instructions:  NO CHANGE If you need a refill on your cardiac medications before your next appointment, please call your pharmacy.   Lab work: If you have labs (blood work) drawn today and your tests are completely normal, you will receive your results only by: Marland Kitchen MyChart Message (if you have MyChart) OR . A paper copy in the mail If you have any lab test that is abnormal or we need to change your treatment, we will call you to review the results.  Testing/Procedures: Your physician has requested that you have a lexiscan myoview. For further information please visit [email or web address]. Please follow instruction sheet, as given.NEEDED ASAP FOR SURGICAL CLEARANCE     Follow-Up: At [facility], you and your health needs are our priority.  As part of our continuing mission to provide you with exceptional heart care, we have created designated Provider Care Teams.  These Care Teams include your primary Cardiologist (physician) and Advanced Practice Providers (APPs -  Physician Assistants and Nurse Practitioners) who all work together to provide you with the care you need, when you need it. Your physician recommends that you schedule a follow-up appointment in: AS NEEDED PENDING TEST RESULTS

## 2018-12-13 ENCOUNTER — Telehealth: Payer: Self-pay | Admitting: Oncology

## 2018-12-13 NOTE — Telephone Encounter (Signed)
Called [facility] at [facility] and asked if patient's stress test on 4/21 and 4/22 could be moved up.  They said that they can't schedule before 01/01/19 due to the Covid-19 protocol and that there are no openings sooner.

## 2018-12-25 ENCOUNTER — Ambulatory Visit: Payer: Self-pay | Admitting: Cardiology

## 2018-12-29 ENCOUNTER — Ambulatory Visit (HOSPITAL_COMMUNITY): Payer: Medicare HMO | Admitting: Psychiatry

## 2018-12-29 ENCOUNTER — Other Ambulatory Visit: Payer: Self-pay

## 2019-01-01 ENCOUNTER — Other Ambulatory Visit: Payer: Self-pay

## 2019-01-01 ENCOUNTER — Ambulatory Visit (INDEPENDENT_AMBULATORY_CARE_PROVIDER_SITE_OTHER): Payer: Self-pay | Admitting: Psychiatry

## 2019-01-01 DIAGNOSIS — F331 Major depressive disorder, recurrent, moderate: Secondary | ICD-10-CM

## 2019-01-01 DIAGNOSIS — F411 Generalized anxiety disorder: Secondary | ICD-10-CM

## 2019-01-01 DIAGNOSIS — F4310 Post-traumatic stress disorder, unspecified: Secondary | ICD-10-CM

## 2019-01-01 MED ORDER — TRAZODONE HCL 300 MG PO TABS: 300.0000 mg | ORAL_TABLET | Freq: Every day | ORAL | 0 refills | Status: DC

## 2019-01-01 MED ORDER — CLONAZEPAM 1 MG PO TABS: ORAL_TABLET | ORAL | 2 refills | Status: DC

## 2019-01-01 MED ORDER — FLUOXETINE HCL 40 MG PO CAPS: 40.0000 mg | ORAL_CAPSULE | Freq: Every day | ORAL | 0 refills | Status: DC

## 2019-01-01 MED ORDER — PRAZOSIN HCL 5 MG PO CAPS: 5.0000 mg | ORAL_CAPSULE | Freq: Every day | ORAL | 0 refills | Status: DC

## 2019-01-01 MED ORDER — LAMOTRIGINE 150 MG PO TABS: 150.0000 mg | ORAL_TABLET | Freq: Two times a day (BID) | ORAL | 0 refills | Status: DC
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
## 2019-01-02 ENCOUNTER — Encounter (HOSPITAL_COMMUNITY): Payer: Self-pay

## 2019-01-05 ENCOUNTER — Other Ambulatory Visit: Payer: Self-pay

## 2019-01-05 NOTE — Patient Outreach (Signed)
  [place] [facility]) Care Management Chronic Special Needs Program  01/05/2019  Name: DEAH OTTAWAY DOB: 1960/10/01  MRN: [id number]  Ms. Jaye Saal is enrolled in a chronic special needs plan for Diabetes. Client called with no answer No answer and HIPAA compliant message left. Plan for 2nd outreach call in 2-3 business days. Chronic care management coordinator will attempt outreach .   Peter Garter RN, Jackquline Denmark, CDE Chronic Care Management Coordinator [facility] Care Management [phone]

## 2019-01-09 ENCOUNTER — Other Ambulatory Visit: Payer: Self-pay

## 2019-01-09 ENCOUNTER — Encounter (HOSPITAL_COMMUNITY): Payer: HMO

## 2019-01-09 ENCOUNTER — Encounter (HOSPITAL_COMMUNITY): Payer: Self-pay

## 2019-01-09 DIAGNOSIS — E1169 Type 2 diabetes mellitus with other specified complication: Secondary | ICD-10-CM | POA: Insufficient documentation

## 2019-01-09 DIAGNOSIS — E119 Type 2 diabetes mellitus without complications: Secondary | ICD-10-CM

## 2019-01-09 NOTE — Patient Outreach (Signed)
[place] [facility]) Care Management Chronic Special Needs Program  01/09/2019  Name: [name] DOB: 1961/06/08  MRN: [id number]  Ms. [name] is enrolled in a chronic special needs plan for Diabetes. Chronic Care Management Coordinator telephoned client to review health risk assessment and to develop individualized care plan.  Introduced the chronic care management program, importance of client participation, and taking their care plan to all provider appointments and inpatient facilities.  Reviewed the transition of care process and possible referral to community care management.  Subjective: Client states that she has been staying in due to [place] and she has been stress eating some.  States she does not check her blood sugars but she would like to get a new glucometer.  States she is to get surgery to repair a hernia and to remove her uterus when they start doing surgeries again.  States that in the past she has had trouble affording her medications.  Goals Addressed            This Visit's Progress   .  Acknowledge receipt of Programme researcher, broadcasting/film/video      . Advanced Care Planning complete by next 9 months      . Client understands the importance of follow-up with providers by attending scheduled visits      . Client will report abillity to obtain Medications within next 6 months       [employer] scientist (medical) will contact you    . Client will use Assistive Devices as needed and verbalize understanding of device use      . Client will verbalize knowledge of chronic lung disease as evidenced by no ED visits or Inpatient stays related to chronic lung disease       . Client will verbalize knowledge of self management of Hypertension as evidences by BP reading of 140/90 or less; or as defined by provider      . HEMOGLOBIN A1C < 7.0       Diabetes self management actions:  Glucose monitoring per provider recommendations  Perform Quality checks on  blood meter  Eat Healthy  Check feet daily  Visit provider every 3-6 months as directed  Hbg A1C level every 3-6 months.  Eye Exam yearly    . Maintain timely refills of diabetic medication as prescribed within the year .      Marland Kitchen Obtain annual  Lipid Profile, LDL-C      . Obtain Annual Eye (retinal)  Exam       . Obtain Annual Foot Exam      . Obtain annual screen for micro albuminuria (urine) , nephropathy (kidney problems)      . Obtain Hemoglobin A1C at least 2 times per year      . Visit Primary Care Provider or Endocrinologist at least 2 times per year        Client is  meeting diabetes self management goal of hemoglobin A1C of <7% with last reading of 6.3%.  Client needs a new glucometer and instructed to contact her provider to order a new meter.  Instructed on preferred brands that will be covered(One Touch, Freestyle or Precision) Client agreeable to referral to pharmacy for past issues with cost and >8 medications.  Declines social work referral for [employer] but would like more information and forms.  Plan:  Send successful outreach letter with a copy of their individualized care plan, Send individual care plan to provider and Send educational material- HTN, DM,  COPD, Advanced Directives  Messaged Dr. Brigitte Pulse to send order for covered glucometer to client's pharmacy  Chronic care management coordination will outreach in:  6 Months  Will refer client to:  Woodman RN, [facility], [place] Management [phone]

## 2019-01-10 ENCOUNTER — Ambulatory Visit (HOSPITAL_COMMUNITY): Payer: HMO

## 2019-01-10 ENCOUNTER — Other Ambulatory Visit: Payer: Self-pay | Admitting: Pharmacist

## 2019-01-10 NOTE — Patient Outreach (Signed)
[place] [facility]) Care Management  01/10/2019  [name] 04/07/1961 [id number]   Patient was called to complete a medication review and medication assistance evaluation.  Unfortunately, she did not answer the phone. HIPAA compliant message was left on her mobile voice mail.  The number listed as her home phone in the chart was not operational.  Plan: Send patient an unsuccessful outreach letter. Call patient back in 5-7 business days.  Elayne Guerin, PharmD, [place] Clinical Pharmacist [phone]

## 2019-01-11 ENCOUNTER — Ambulatory Visit (HOSPITAL_COMMUNITY): Payer: HMO | Attending: Cardiovascular Disease

## 2019-01-15 DIAGNOSIS — R05 Cough: Secondary | ICD-10-CM | POA: Diagnosis not present

## 2019-01-15 DIAGNOSIS — J181 Lobar pneumonia, unspecified organism: Secondary | ICD-10-CM | POA: Diagnosis not present

## 2019-01-15 DIAGNOSIS — J449 Chronic obstructive pulmonary disease, unspecified: Secondary | ICD-10-CM | POA: Diagnosis not present

## 2019-01-15 DIAGNOSIS — R918 Other nonspecific abnormal finding of lung field: Secondary | ICD-10-CM | POA: Diagnosis not present

## 2019-01-15 DIAGNOSIS — Z20818 Contact with and (suspected) exposure to other bacterial communicable diseases: Secondary | ICD-10-CM | POA: Diagnosis not present

## 2019-01-16 ENCOUNTER — Telehealth: Payer: Self-pay | Admitting: Oncology

## 2019-01-16 NOTE — Telephone Encounter (Signed)
Called [name] about her stress test.  She said she had to cancel the first on because she was sick.  She now has pneumonia and has been quarantined for 2 weeks.  She was tested for Covid-19 yesterday but does not know the results yet.  She said the stress test will be rescheduled when she is off of quarantine.

## 2019-01-22 ENCOUNTER — Other Ambulatory Visit: Payer: Self-pay | Admitting: Pharmacist

## 2019-01-22 ENCOUNTER — Ambulatory Visit: Payer: Self-pay | Admitting: Pharmacist

## 2019-01-22 NOTE — Patient Outreach (Signed)
[place] [facility]) Care Management  01/22/2019  [name] 10/09/1960 [id number]   Patient was called regarding medication review and assessment for medication assistance as she in [place]. Unfortunately, she did not answer the phone. Today's call is the second unsuccessful call.   Plan: Send patient an unsuccessful outreach letter. Call patient back in 3-4 weeks.  Elayne Guerin, PharmD, [place] Clinical Pharmacist [phone]

## 2019-01-31 ENCOUNTER — Telehealth (HOSPITAL_COMMUNITY): Payer: Self-pay

## 2019-01-31 NOTE — Telephone Encounter (Signed)
Encounter complete. 

## 2019-02-01 ENCOUNTER — Inpatient Hospital Stay (HOSPITAL_COMMUNITY): Admission: RE | Admit: 2019-02-01 | Payer: HMO | Source: Ambulatory Visit

## 2019-02-05 ENCOUNTER — Telehealth: Payer: Self-pay | Admitting: Oncology

## 2019-02-05 NOTE — Telephone Encounter (Signed)
Called [name] to check on her.  She said she has not had a stress test yet because she would have to wear a mask for the testing.  She said this gives her panic attacks.  Advised her to call Cardiology to see if there is anything that can be done so that her surgery is not delayed anymore.  She said she would and that she would call us back.

## 2019-02-09 ENCOUNTER — Telehealth: Payer: Self-pay | Admitting: Oncology

## 2019-02-09 NOTE — Telephone Encounter (Signed)
Left a message for patient regarding scheduling stress test/surgery.  Requested a return call.

## 2019-02-16 ENCOUNTER — Telehealth: Payer: Self-pay | Admitting: Oncology

## 2019-02-16 NOTE — Telephone Encounter (Signed)
Left a message for [name] to see if she has a stress test scheduled yet.  Requested a return call.

## 2019-02-21 ENCOUNTER — Ambulatory Visit: Payer: Self-pay | Admitting: Pharmacist

## 2019-02-21 ENCOUNTER — Other Ambulatory Visit: Payer: Self-pay | Admitting: Pharmacist

## 2019-02-21 NOTE — Patient Outreach (Addendum)
[place] [facility]) Care Management  02/21/2019  [name] 08/11/1961 [id number]   Patient was called regarding medication review and assessment for medication assistance as she in [place]. Unfortunately, she did not answer the phone. Today's call was the 3rd unsuccessful call.   Plan: Call patient back in 3 months.  Elayne Guerin, PharmD, [place] Clinical Pharmacist [phone]

## 2019-02-23 ENCOUNTER — Telehealth (HOSPITAL_COMMUNITY): Payer: Self-pay | Admitting: *Deleted

## 2019-02-23 ENCOUNTER — Telehealth: Payer: Self-pay | Admitting: Gynecologic Oncology

## 2019-02-23 ENCOUNTER — Telehealth: Payer: Self-pay | Admitting: *Deleted

## 2019-02-23 DIAGNOSIS — N939 Abnormal uterine and vaginal bleeding, unspecified: Secondary | ICD-10-CM

## 2019-02-23 DIAGNOSIS — R1030 Lower abdominal pain, unspecified: Secondary | ICD-10-CM

## 2019-02-23 DIAGNOSIS — C541 Malignant neoplasm of endometrium: Secondary | ICD-10-CM

## 2019-02-23 MED ORDER — MEGESTROL ACETATE 40 MG PO TABS: 80.0000 mg | ORAL_TABLET | Freq: Two times a day (BID) | ORAL | 1 refills | Status: DC

## 2019-02-23 MED ORDER — OXYCODONE HCL 5 MG PO TABS: 5.0000 mg | ORAL_TABLET | ORAL | 0 refills | Status: DC | PRN

## 2019-02-23 NOTE — Telephone Encounter (Signed)
Returned call to patient.  See previous telephone note. Patient reporting heavy bleeding and moderate to severe lower abdominal cramping.  States she has tramadol but is not having any relief from her symptoms with use.  Per Dr. [name], advised patient that we will prescribe 10 oxycodone that she is to take only for severe pain. Advised her not to take with the tramadol and to not take and drive. Per Dr. [name], also plan to begin taking Megace 80 mg BID for the bleeding. Advised her of the risks of Megace, esp since she is a smoker, and the signs and symptoms to watch out for for a DVT or PE.  She is to have her cardiac testing on Tues. Advised her that we will follow up on the results.  Plan is still to proceed with joint surgery with [facility] if clearance is obtained.  She will need a CT AP prior to surgery per Dr. [name] since it has been some time since she was saw last. No concerns voiced from the pt. Advised to call for any needs or concerns.

## 2019-02-23 NOTE — Telephone Encounter (Signed)
Close encounter 

## 2019-02-23 NOTE — Telephone Encounter (Signed)
Patient called and stated "I have my stress test on Tuesday 6/9. Dr. [name] gave me a one time offer for pain pills. I would like to take her up on that if it's still available. I am in a lot of pain. I am bleeding heavy this past week, like a period. Heavy with clots, bright red blood. A lot of cramping. I wear a diaper because pads would hold it. I change it three times a day. I have tramadol, but it doesn't help." Explained that I would give the message to [facility] APP and we would call her back.   Checked with the patient, she would like medicines sent to [facility]

## 2019-02-27 ENCOUNTER — Ambulatory Visit (HOSPITAL_COMMUNITY)
Admission: RE | Admit: 2019-02-27 | Discharge: 2019-02-27 | Disposition: A | Discharge status: Home / Self Care | Payer: HMO | Source: Ambulatory Visit | Attending: Internal Medicine | Admitting: Internal Medicine

## 2019-02-27 ENCOUNTER — Other Ambulatory Visit: Payer: Self-pay

## 2019-02-27 DIAGNOSIS — R072 Precordial pain: Secondary | ICD-10-CM | POA: Insufficient documentation

## 2019-02-27 DIAGNOSIS — Z01818 Encounter for other preprocedural examination: Secondary | ICD-10-CM | POA: Insufficient documentation

## 2019-02-27 LAB — MYOCARDIAL PERFUSION IMAGING
LV dias vol: 83 mL (ref 46–106)
LV sys vol: 26 mL
Peak HR: 96 {beats}/min
Rest HR: 69 {beats}/min
SDS: 6
SRS: 0
SSS: 6
TID: 1.05

## 2019-02-27 MED ORDER — AMINOPHYLLINE 25 MG/ML IV SOLN
75.0000 mg | Freq: Once | INTRAVENOUS | Status: AC
  Administered 2019-02-27: 75 mg via INTRAVENOUS

## 2019-02-27 MED ORDER — REGADENOSON 0.4 MG/5ML IV SOLN
0.4000 mg | Freq: Once | INTRAVENOUS | Status: AC
  Administered 2019-02-27: 0.4 mg via INTRAVENOUS

## 2019-02-27 MED ORDER — TECHNETIUM TC 99M TETROFOSMIN IV KIT
30.8000 | PACK | Freq: Once | INTRAVENOUS | Status: AC | PRN
  Administered 2019-02-27: 30.8 via INTRAVENOUS
  Filled 2019-02-27: qty 31

## 2019-02-27 MED ORDER — TECHNETIUM TC 99M TETROFOSMIN IV KIT
10.5000 | PACK | Freq: Once | INTRAVENOUS | Status: AC | PRN
  Administered 2019-02-27: 10.5 via INTRAVENOUS
  Filled 2019-02-27: qty 11

## 2019-03-03 ENCOUNTER — Other Ambulatory Visit: Payer: Self-pay | Admitting: Gynecologic Oncology

## 2019-03-03 DIAGNOSIS — N939 Abnormal uterine and vaginal bleeding, unspecified: Secondary | ICD-10-CM

## 2019-03-03 DIAGNOSIS — C541 Malignant neoplasm of endometrium: Secondary | ICD-10-CM

## 2019-03-06 ENCOUNTER — Telehealth: Payer: Self-pay | Admitting: Oncology

## 2019-03-06 ENCOUNTER — Telehealth: Payer: Self-pay | Admitting: Cardiovascular Disease

## 2019-03-06 NOTE — Telephone Encounter (Signed)
Her Myoview stress test was normal.  Cleared for her open OB/GYN surgery at low risk.

## 2019-03-06 NOTE — Telephone Encounter (Signed)
Is patient cleared for surgery? Please advise... Stress Test 02/27/19.

## 2019-03-06 NOTE — Telephone Encounter (Signed)
New Message   Santiago Glad is calling from the cancer center Dr. [name] office. Patient just had a stress test. They are wanting to know whether or not she is cleared for surgery. She had an initial appt with Dr. Gwenlyn Found in March but due to covid had to wait on the stress portion. Please advise.

## 2019-03-06 NOTE — Telephone Encounter (Signed)
Left a message with Dr. Kennon Holter office to see if patient is cleared for joint surgery with Dr. [name] and Dr. Johney Maine.

## 2019-03-07 ENCOUNTER — Telehealth: Payer: Self-pay | Admitting: Gynecologic Oncology

## 2019-03-07 ENCOUNTER — Telehealth: Payer: Self-pay | Admitting: *Deleted

## 2019-03-07 DIAGNOSIS — D4959 Neoplasm of unspecified behavior of other genitourinary organ: Secondary | ICD-10-CM

## 2019-03-07 DIAGNOSIS — C541 Malignant neoplasm of endometrium: Secondary | ICD-10-CM

## 2019-03-07 NOTE — Telephone Encounter (Signed)
Returned call to patient.  She states she is "not doing well."  "Before I have an operation, I want a scan. Over the last month, I have went downhill fast.  I will also need a month to gets things together and take care of business and figure out where I will go after surgery since my husband cannot take care of me."  Advised patient that our plan was to obtain imaging anyways since it had been a significant amount of time since she was diagnosed.  Per Dr. [name], plan for CT scan of the abdomen and pelvis and then plan a webex to discuss.

## 2019-03-07 NOTE — Telephone Encounter (Signed)
Called Santiago Glad back, advised of message from [facility]. No other questions or concerns at this time.

## 2019-03-07 NOTE — Telephone Encounter (Signed)
Patient called to speak with Melissa APP regarding surgery verus other alternatives. Message forwarded to [facility] APP

## 2019-03-09 ENCOUNTER — Inpatient Hospital Stay: Payer: HMO | Attending: Gynecologic Oncology

## 2019-03-09 ENCOUNTER — Other Ambulatory Visit: Payer: Self-pay

## 2019-03-09 DIAGNOSIS — F419 Anxiety disorder, unspecified: Secondary | ICD-10-CM | POA: Insufficient documentation

## 2019-03-09 DIAGNOSIS — Z79899 Other long term (current) drug therapy: Secondary | ICD-10-CM | POA: Diagnosis not present

## 2019-03-09 DIAGNOSIS — C541 Malignant neoplasm of endometrium: Secondary | ICD-10-CM | POA: Diagnosis not present

## 2019-03-09 DIAGNOSIS — F329 Major depressive disorder, single episode, unspecified: Secondary | ICD-10-CM | POA: Insufficient documentation

## 2019-03-09 DIAGNOSIS — G2581 Restless legs syndrome: Secondary | ICD-10-CM | POA: Insufficient documentation

## 2019-03-09 DIAGNOSIS — E119 Type 2 diabetes mellitus without complications: Secondary | ICD-10-CM | POA: Diagnosis not present

## 2019-03-09 DIAGNOSIS — Z7984 Long term (current) use of oral hypoglycemic drugs: Secondary | ICD-10-CM | POA: Diagnosis not present

## 2019-03-09 DIAGNOSIS — F17210 Nicotine dependence, cigarettes, uncomplicated: Secondary | ICD-10-CM | POA: Insufficient documentation

## 2019-03-09 DIAGNOSIS — Z9049 Acquired absence of other specified parts of digestive tract: Secondary | ICD-10-CM | POA: Diagnosis not present

## 2019-03-09 DIAGNOSIS — J449 Chronic obstructive pulmonary disease, unspecified: Secondary | ICD-10-CM | POA: Diagnosis not present

## 2019-03-09 LAB — BASIC METABOLIC PANEL
Anion gap: 8 (ref 5–15)
BUN: 18 mg/dL (ref 6–20)
CO2: 27 mmol/L (ref 22–32)
Calcium: 9.3 mg/dL (ref 8.9–10.3)
Chloride: 106 mmol/L (ref 98–111)
Creatinine, Ser: 0.81 mg/dL (ref 0.44–1.00)
GFR calc Af Amer: >60 mL/min (ref >60)
GFR calc non Af Amer: >60 mL/min (ref >60)
Glucose, Bld: 120 mg/dL — ABNORMAL HIGH (ref 70–99)
Potassium: 5.1 mmol/L (ref 3.5–5.1)
Sodium: 141 mmol/L (ref 135–145)

## 2019-03-13 ENCOUNTER — Other Ambulatory Visit: Payer: Self-pay

## 2019-03-13 ENCOUNTER — Ambulatory Visit (HOSPITAL_COMMUNITY)
Admission: RE | Admit: 2019-03-13 | Discharge: 2019-03-13 | Disposition: A | Discharge status: Home / Self Care | Payer: HMO | Source: Ambulatory Visit | Attending: Gynecologic Oncology | Admitting: Gynecologic Oncology

## 2019-03-13 DIAGNOSIS — D4959 Neoplasm of unspecified behavior of other genitourinary organ: Secondary | ICD-10-CM

## 2019-03-13 DIAGNOSIS — C541 Malignant neoplasm of endometrium: Secondary | ICD-10-CM | POA: Insufficient documentation

## 2019-03-13 DIAGNOSIS — N200 Calculus of kidney: Secondary | ICD-10-CM | POA: Diagnosis not present

## 2019-03-13 MED ORDER — SODIUM CHLORIDE (PF) 0.9 % IJ SOLN
INTRAMUSCULAR | Status: AC
  Filled 2019-03-13: qty 50

## 2019-03-13 MED ORDER — IOHEXOL 300 MG/ML  SOLN
100.0000 mL | Freq: Once | INTRAMUSCULAR | Status: AC | PRN
  Administered 2019-03-13: 100 mL via INTRAVENOUS

## 2019-03-15 ENCOUNTER — Telehealth: Payer: Self-pay | Admitting: Gynecologic Oncology

## 2019-03-15 NOTE — Telephone Encounter (Signed)
Confirmed appt and verified info. °

## 2019-03-16 ENCOUNTER — Inpatient Hospital Stay (HOSPITAL_BASED_OUTPATIENT_CLINIC_OR_DEPARTMENT_OTHER): Payer: HMO | Admitting: Gynecologic Oncology

## 2019-03-16 ENCOUNTER — Encounter: Payer: Self-pay | Admitting: Oncology

## 2019-03-16 ENCOUNTER — Encounter: Payer: Self-pay | Admitting: Gynecologic Oncology

## 2019-03-16 DIAGNOSIS — N939 Abnormal uterine and vaginal bleeding, unspecified: Secondary | ICD-10-CM | POA: Diagnosis not present

## 2019-03-16 DIAGNOSIS — Z6841 Body Mass Index (BMI) 40.0 and over, adult: Secondary | ICD-10-CM

## 2019-03-16 DIAGNOSIS — C541 Malignant neoplasm of endometrium: Secondary | ICD-10-CM | POA: Diagnosis not present

## 2019-03-16 DIAGNOSIS — K432 Incisional hernia without obstruction or gangrene: Secondary | ICD-10-CM

## 2019-03-16 DIAGNOSIS — Z90710 Acquired absence of both cervix and uterus: Secondary | ICD-10-CM | POA: Diagnosis not present

## 2019-03-16 DIAGNOSIS — Z7189 Other specified counseling: Secondary | ICD-10-CM

## 2019-03-16 NOTE — Progress Notes (Signed)
Sent inbasket with new referral information for Dr. Sondra Come to Radiation Oncology.

## 2019-03-16 NOTE — Progress Notes (Signed)
Gynecologic Oncology Telehealth Follow-up Note: Gyn-Onc  I connected with [name] on 03/16/19 at  9:00 AM EDT by telephone and verified that I am speaking with the correct person using two identifiers.  I discussed the limitations, risks, security and privacy concerns of performing an evaluation and management service by telemedicine and the availability of in-person appointments. I also discussed with the patient that there may be a patient responsible charge related to this service. The patient expressed understanding and agreed to proceed.  Other persons participating in the visit and their role in the encounter: none.  Patient's location: home Provider's location: [place]  Chief Complaint:  Chief Complaint  Patient presents with  . endometrial cancer    Assessment/Plan:  [name]  is a 58 y.o.  year old with grade 3 endometrial cancer (clinical stage I on repeat imaging), a complex incisional abdominal hernia, obesity (BMI 40kg/m2) and complex medical and surgical history. She has a poor performance status (ECOG 2-3) due to her comorbidities and recent pneumonia. Given the complexity of her surgical history and the complexity of the required procedure, I do not think that she is a good candidate for surgery at this time. I am recommending primary definitive radiation with external beam and brachytherapy (heyman's capsules). I feel this will give [place] the best chance of proceeding with therapy now, which is important given that she has already had delay in therapy for 3 months due to medical illness.  After hearing my explanation of the treatment and rationale, she is in agreement.   We will facilitate her seeing Dr Sondra Come.  HPI: [name] is a 58 year old P0 who is seen in consultation at the request of Dr Benjie Karvonen for grade 3 endometrial cancer.    The patient reports history of stomach pains for approximately 2 years.  She feels that these  began after her last surgery for hernias and reversal of a Hartman's pouch.  She also reports intermittent vaginal spotting for at least 2 years.  Due to her persistent cramping abdominal pain she underwent a CT scan of the abdomen and pelvis on September 22, 2018.  This revealed no acute findings or explanation for the patient's symptoms.  There is a stable periumbilical hernia containing small bowel but no evidence of bowel incarceration or obstruction.  Uterine fibroids were present with prominence of the endometrium for age.  She was then seen by her gynecologist who performed an endometrial Pipelle biopsy on 30 19th 2020.  This revealed FIGO grade 3 endometrioid adenocarcinoma.  P53 negative stains.  ER and PR were negative.  The patient has a complex medical history including history of COPD and asthma from a smoking history.  She also has a history of diabetes mellitus type 2.  Her last hemoglobin A1c in January 2020 was normal at 6.3.  She has chronic pain and takes Ultram and Klonopin.  She is morbidly obese with a BMI of 40 kg/m.  Her surgical history is also complex.  She has a remote history of a cholecystectomy.  When she was a 58 year old child she underwent an exploratory laparotomy with right salpingo-oophorectomy for an ovarian cyst.   In 2010 she experienced ruptured diverticulitis and a Hartman's procedure was performed at [facility].  She developed a parastomal hernia with small bowel obstruction and incarceration in February 2014 and was taken to the operating room at [facility].  The operative note reveals 1 hour of complex adhesio lysis  and primary repair of the hernia.  She was in the ICU with pneumonia for 2 weeks following the procedure.  In August 2014 she was taken back to the operating room for a laparotomy and extensive lysis of adhesions again and takedown of the ostomy with 3 anastomosis and closure of the parastomal hernia and ventral abdominal hernia primarily (no mesh was  used).  Postoperatively from the surgery she developed surgical site infection in the incision with wound breakdown requiring a wound VAC.  Interval Hx:  The patient was offered either primary surgery (hysterectomy, BSO, hernia repair) versus radiation and elected to proceed with surgery. She saw Dr Johney Maine in March, 2020 for an evaluation for a planned combined procedure (as hernia repair would be necessary at the time of hysterectomy - not elective). Dr Johney Maine explained the procedure but felt that she needed significant optimizaton and evaluation prior to proceeding given her complex medical history. During that intervening time, she developed pneumonia (non-COVID), which left her quite debilitated.  She received "clearance" from Dr Gwenlyn Found (cardiology) as "low risk" for an open procedure.  The patient elected to not proceed with surgery in May/June due to feeling too weak and fatigued from her illness.  A repeat CT scan was performed on 03/12/29 which showed a bulky, heterogeneous uterus, but no gross extrauterine disease (clinical stage I disease).  She continued to have bleeding symptoms and cramping pain.  Today's call was arranged to discuss the CT scan and make plans for definitive management of her cancer. She reported feeling too weak for major open surgery. She didn't think she would make it, or that her husband (also of poor health) could care for her postop. She was hoping to be considered for an alternative approach.   Current Meds:  Outpatient Encounter Medications as of 03/16/2019  Medication Sig  . atorvastatin (LIPITOR) 40 MG tablet Take 40 mg by mouth daily at 6 PM.   . clonazePAM (KLONOPIN) 1 MG tablet Take 1 tab twice daily  . dicyclomine (BENTYL) 10 MG capsule Take 10 mg by mouth as needed for spasms.  Marland Kitchen FLUoxetine (PROZAC) 40 MG capsule Take 1 capsule (40 mg total) by mouth daily.  Marland Kitchen HYDROcodone-acetaminophen (NORCO/VICODIN) 5-325 MG tablet Take 1 tablet by mouth every 6  (six) hours as needed for moderate pain.  Marland Kitchen lamoTRIgine (LAMICTAL) 150 MG tablet Take 1 tablet (150 mg total) by mouth 2 (two) times daily.  Marland Kitchen lisinopril (PRINIVIL,ZESTRIL) 20 MG tablet TK 1 T PO D  . megestrol (MEGACE) 40 MG tablet TAKE 2 TABLETS (80 MG TOTAL) BY MOUTH 2 (TWO) TIMES DAILY.  . metFORMIN (GLUCOPHAGE) 1000 MG tablet Take 1,000 mg by mouth 2 (two) times daily with a meal.   . omeprazole (PRILOSEC) 40 MG capsule TK 1 C PO D  . ondansetron (ZOFRAN) 8 MG tablet Take 8 mg by mouth as directed.   Marland Kitchen oxyCODONE (OXY IR/ROXICODONE) 5 MG immediate release tablet Take 1 tablet (5 mg total) by mouth every 4 (four) hours as needed for severe pain. Do not take with tramadol, do not take and drive  . prazosin (MINIPRESS) 5 MG capsule Take 1 capsule (5 mg total) by mouth at bedtime.  . traMADol (ULTRAM) 50 MG tablet Take 100 mg by mouth as needed. Patient states she takes two pills every 8 hours as needed for pain.  . trazodone (DESYREL) 300 MG tablet Take 1 tablet (300 mg total) by mouth at bedtime.  . VENTOLIN HFA 108 (90 BASE) MCG/ACT  inhaler Inhale 2 puffs into the lungs every 6 (six) hours as needed for wheezing or shortness of breath.    No facility-administered encounter medications on file as of 03/16/2019.     Allergy:  Allergies  Allergen Reactions  . Tramadol     Per Dr. Sharyon Medicus is renal failure, patient states that she is currently taking this medication and she has not had a reaction to Tramadol since this time.    Social Hx:   Social History   Socioeconomic History  . Marital status: Married    Spouse name: Not on file  . Number of children: Not on file  . Years of education: Not on file  . Highest education level: Not on file  Occupational History  . Not on file  Social Needs  . Financial resource strain: Not on file  . Food insecurity    Worry: Never true    Inability: Never true  . Transportation needs    Medical: No    Non-medical: No  Tobacco Use   . Smoking status: Current Some Day Smoker    Packs/day: 0.50    Types: Cigarettes  . Smokeless tobacco: Never Used  . Tobacco comment: about one pack a day on a bad day  Substance and Sexual Activity  . Alcohol use: No  . Drug use: No  . Sexual activity: Never    Birth control/protection: None  Lifestyle  . Physical activity    Days per week: Not on file    Minutes per session: Not on file  . Stress: Not on file  Relationships  . Social Herbalist on phone: Not on file    Gets together: Not on file    Attends religious service: Not on file    Active member of club or organization: Not on file    Attends meetings of clubs or organizations: Not on file    Relationship status: Not on file  . Intimate partner violence    Fear of current or ex partner: Not on file    Emotionally abused: Not on file    Physically abused: Not on file    Forced sexual activity: Not on file  Other Topics Concern  . Not on file  Social History Narrative  . Not on file    Past Surgical Hx:  Past Surgical History:  Procedure Laterality Date  . ABDOMINAL HYSTERECTOMY    . APPENDECTOMY    . APPLICATION OF WOUND VAC N/A 11/01/2012   Procedure: APPLICATION OF WOUND VAC;  Surgeon: Joyice Faster. Cornett, MD;  Location: [facility];  Service: General;  Laterality: N/A;  . CHOLECYSTECTOMY    . COLOSTOMY    . COLOSTOMY CLOSURE N/A 11/01/2012   Procedure: COLOSTOMY CLOSURE;  Surgeon: Joyice Faster. Cornett, MD;  Location: [facility];  Service: General;  Laterality: N/A;  REPAIR PARASTOMAL HERNIA; REVISION OF COLOSTOMY  . COLOSTOMY CLOSURE N/A 05/03/2013   Procedure: COLOSTOMY CLOSURE;  Surgeon: Joyice Faster. Cornett, MD;  Location: [facility];  Service: General;  Laterality: N/A;  . ESOPHAGOGASTRODUODENOSCOPY Left 11/27/2012   Procedure: ESOPHAGOGASTRODUODENOSCOPY (EGD);  Surgeon: Winfield Cunas., MD;  Location: [facility];  Service: Gastroenterology;  Laterality: Left;  . INCISIONAL HERNIA REPAIR N/A 11/01/2012    Procedure: HERNIA REPAIR INCISIONAL;  Surgeon: Joyice Faster. Cornett, MD;  Location: [facility];  Service: General;  Laterality: N/A;  . LAPAROTOMY  11/01/2012   Procedure: EXPLORATORY LAPAROTOMY;  Surgeon: Joyice Faster. Cornett, MD;  Location: [facility];  Service: General;;  exploratory laparotomy,repair of parastomal hernia and incisional hernia, lysis of adhesions, and application of wound V.A.C.  . LYSIS OF ADHESION N/A 11/01/2012   Procedure: LYSIS OF ADHESION;  Surgeon: Joyice Faster. Cornett, MD;  Location: [facility];  Service: General;  Laterality: N/A;  . PARASTOMAL HERNIA REPAIR N/A 11/01/2012   Procedure: HERNIA REPAIR PARASTOMAL;  Surgeon: Joyice Faster. Cornett, MD;  Location: [facility];  Service: General;  Laterality: N/A;  . PARTIAL HYSTERECTOMY     partial    Past Medical Hx:  Past Medical History:  Diagnosis Date  . Acid reflux disease   . Anxiety   . Asthma   . Bronchitis, chronic (HCC)    Effects worse with URI  . COPD (chronic obstructive pulmonary disease) ([place])   . Depression   . 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, type 2  . Diverticulitis   . Dysplasia of cervix (uteri)    patient states she had dysplasia of the uterus stage 4  . Fibromyalgia   . Fibromyalgia   . GERD (gastroesophageal reflux disease)   . Headache(784.0)    migraine headache  . Kidney stones    at least 6 stones in past  . Pneumonia 2019  . Restless leg syndrome    Bilateral  . Vomiting    last night at 2300 after last round of antibiotic    Past Gynecological History:  G0, no history of abnormal paps.  Patient's last menstrual period was 04/20/2013.  Family Hx:  Family History  Problem Relation Age of Onset  . Diabetes Mother   . Throat cancer Mother   . Uterine cancer Sister   . Deep vein thrombosis Neg Hx   . Pulmonary embolism Neg Hx     Review of Systems:  Constitutional  Feels well,    ENT Normal appearing ears and nares bilaterally Skin/Breast  No rash, sores, jaundice,  itching, dryness Cardiovascular  No chest pain, shortness of breath, or edema  Pulmonary  No cough or wheeze.  Gastro Intestinal  No nausea, vomitting, or diarrhoea. No bright red blood per rectum, no abdominal pain, change in bowel movement, or constipation.  Genito Urinary  No frequency, urgency, dysuria, + postmenopausal bleeding.  Musculo Skeletal  No myalgia, arthralgia, joint swelling or pain  Neurologic  No weakness, numbness, change in gait,  Psychology  No depression, anxiety, insomnia.   Vitals:  Last menstrual period 04/20/2013.  Physical Exam: Deferred due to phone visit.  I discussed the assessment and treatment plan with the patient. The patient was provided with an opportunity to ask questions and all were answered. The patient agreed with the plan and demonstrated an understanding of the instructions.   The patient was advised to call back or see an in-person evaluation if the symptoms worsen or if the condition fails to improve as anticipated.   I provided 20 minutes of non face-to-face telephone visit time during this encounter, and > 50% was spent counseling as documented under my assessment & plan.   Thereasa Solo, MD  03/16/2019, 9:23 AM

## 2019-03-20 ENCOUNTER — Telehealth: Payer: Self-pay | Admitting: Gynecologic Oncology

## 2019-03-20 ENCOUNTER — Telehealth: Payer: Self-pay

## 2019-03-20 ENCOUNTER — Telehealth: Payer: Self-pay | Admitting: Oncology

## 2019-03-20 NOTE — Progress Notes (Signed)
GYN Location of Tumor / Histology: Endometrial  [name] presented with symptoms of:The patient reports history of stomach pains for approximately 2 years.  She feels that these began after her last surgery for hernias and reversal of a Hartman's pouch.  She also reports intermittent vaginal spotting for at least 2 years.  Due to her persistent cramping abdominal pain she underwent a CT scan of the abdomen and pelvis on September 22, 2018.  This revealed no acute findings or explanation for the patient's symptoms.  There is a stable periumbilical hernia containing small bowel but no evidence of bowel incarceration or obstruction.  Uterine fibroids were present with prominence of the endometrium for age.  She was then seen by her gynecologist who performed an endometrial Pipelle biopsy on 30 19th 2020.  This revealed FIGO grade 3 endometrioid adenocarcinoma.  P53 negative stains.  ER and PR were negative.   Biopsies revealed: She was then seen by her gynecologist who performed an endometrial Pipelle biopsy on 30 19th 2020.  This revealed FIGO grade 3 endometrioid adenocarcinoma.  P53 negative stains.  ER and PR were negative.  Past/Anticipated interventions by Gyn/Onc surgery, if any: Per Dr. [name] 03/16/19:  Assessment/Plan:  [name]  is a 57 y.o.  year old with grade 3 endometrial cancer (clinical stage I on repeat imaging), a complex incisional abdominal hernia, obesity (BMI 40kg/m2) and complex medical and surgical history. She has a poor performance status (ECOG 2-3) due to her comorbidities and recent pneumonia. Given the complexity of her surgical history and the complexity of the required procedure, I do not think that she is a good candidate for surgery at this time. I am recommending primary definitive radiation with external beam and brachytherapy (heyman's capsules). I feel this will give [place] the best chance of proceeding with therapy now, which is important given that she  has already had delay in therapy for 3 months due to medical illness.  After hearing my explanation of the treatment and rationale, she is in agreement.   We will facilitate her seeing Dr Sondra Come  Past/Anticipated interventions by medical oncology, if any: None at this time.  Weight changes, if any:  Wt Readings from Last 3 Encounters:  03/21/19 226 lb (102.5 kg)  03/21/19 226 lb 14.4 oz (102.9 kg)  02/27/19 231 lb (104.8 kg)     Bowel/Bladder complaints, if any: Pt reports dysuria, "on the inside, not the outside". Denies hematuria. Pt reports that she has not had vaginal bleeding this week. Pt reports scant brown "slimy, sticky" vaginal discharge. Pt reports diarrhea at baseline. Pt denies rectal bleeding.  Nausea/Vomiting, if any: Pt denies abdominal bloating. Pt reports constant nausea related to the pain. Pt reports vomiting approximately 1-2 times per week.  Pain issues, if any:  Pt reports pain in uterine/vaginal area, rated a "10+"/10. Pt reports pain is cramping, sharp, and constant. Pain is worst with vaginal bleeding.  Pt reports severe pain that began approximately 2 months ago.  SAFETY ISSUES:  Prior radiation? No  Pacemaker/ICD? No  Possible current pregnancy? No  Is the patient on methotrexate? No  Current Complaints / other details:  Pt presents today for initial consult with Dr. Sondra Come for Radiation Oncology. Pt is tearful and frustrated from pain.  BP (!) 105/58 (Patient Position: Sitting)   Pulse 98   Temp 99.2 F (37.3 C) (Oral)   Resp 18   Ht '5\' 4"'  (1.626 m)   Wt 226 lb (102.5 kg)  LMP 04/20/2013   SpO2 97%   BMI 38.79 kg/m   Loma Sousa, RN BSN

## 2019-03-20 NOTE — Telephone Encounter (Signed)
Called [name] and advised her that the appointment with Dr. Sondra Come tomorrow is in person - not on the phone.  She verbalized agreement.  Asked if she would like to talk to Dr. Sondra Come about her pain and she said she would like to speak to Dr. [name] first and wants to keep the appointment with Dr. [name] tomorrow.

## 2019-03-20 NOTE — Telephone Encounter (Signed)
Patient called stating "I have to see Dr. [name] about this pain."

## 2019-03-20 NOTE — Telephone Encounter (Signed)
ENCOUNTER OPENED IN ERROR

## 2019-03-21 ENCOUNTER — Other Ambulatory Visit: Payer: Self-pay

## 2019-03-21 ENCOUNTER — Ambulatory Visit
Admission: RE | Admit: 2019-03-21 | Discharge: 2019-03-21 | Disposition: A | Discharge status: Home / Self Care | Payer: HMO | Source: Ambulatory Visit | Attending: Radiation Oncology | Admitting: Radiation Oncology

## 2019-03-21 ENCOUNTER — Encounter: Payer: Self-pay | Admitting: Gynecologic Oncology

## 2019-03-21 ENCOUNTER — Encounter: Payer: Self-pay | Admitting: Radiation Oncology

## 2019-03-21 ENCOUNTER — Inpatient Hospital Stay: Payer: HMO | Attending: Gynecologic Oncology | Admitting: Gynecologic Oncology

## 2019-03-21 VITALS — BP 105/58 | HR 98 | Temp 99.2°F | Resp 18 | Ht 64.0 in | Wt 226.0 lb

## 2019-03-21 VITALS — BP 105/58 | HR 98 | Temp 99.2°F | Resp 18 | Ht 64.0 in | Wt 226.9 lb

## 2019-03-21 DIAGNOSIS — R102 Pelvic and perineal pain: Secondary | ICD-10-CM

## 2019-03-21 DIAGNOSIS — Z808 Family history of malignant neoplasm of other organs or systems: Secondary | ICD-10-CM | POA: Diagnosis not present

## 2019-03-21 DIAGNOSIS — F329 Major depressive disorder, single episode, unspecified: Secondary | ICD-10-CM | POA: Insufficient documentation

## 2019-03-21 DIAGNOSIS — N200 Calculus of kidney: Secondary | ICD-10-CM | POA: Insufficient documentation

## 2019-03-21 DIAGNOSIS — C541 Malignant neoplasm of endometrium: Secondary | ICD-10-CM | POA: Insufficient documentation

## 2019-03-21 DIAGNOSIS — G893 Neoplasm related pain (acute) (chronic): Secondary | ICD-10-CM | POA: Insufficient documentation

## 2019-03-21 DIAGNOSIS — Z90710 Acquired absence of both cervix and uterus: Secondary | ICD-10-CM | POA: Insufficient documentation

## 2019-03-21 DIAGNOSIS — F17210 Nicotine dependence, cigarettes, uncomplicated: Secondary | ICD-10-CM | POA: Diagnosis not present

## 2019-03-21 DIAGNOSIS — K439 Ventral hernia without obstruction or gangrene: Secondary | ICD-10-CM | POA: Insufficient documentation

## 2019-03-21 DIAGNOSIS — J449 Chronic obstructive pulmonary disease, unspecified: Secondary | ICD-10-CM | POA: Insufficient documentation

## 2019-03-21 DIAGNOSIS — Z79899 Other long term (current) drug therapy: Secondary | ICD-10-CM | POA: Insufficient documentation

## 2019-03-21 DIAGNOSIS — Z7984 Long term (current) use of oral hypoglycemic drugs: Secondary | ICD-10-CM | POA: Diagnosis not present

## 2019-03-21 DIAGNOSIS — Z87442 Personal history of urinary calculi: Secondary | ICD-10-CM | POA: Diagnosis not present

## 2019-03-21 DIAGNOSIS — I700 Atherosclerosis of aorta: Secondary | ICD-10-CM | POA: Diagnosis not present

## 2019-03-21 DIAGNOSIS — K219 Gastro-esophageal reflux disease without esophagitis: Secondary | ICD-10-CM | POA: Insufficient documentation

## 2019-03-21 DIAGNOSIS — E119 Type 2 diabetes mellitus without complications: Secondary | ICD-10-CM | POA: Diagnosis not present

## 2019-03-21 MED ORDER — OXYCODONE HCL ER 10 MG PO T12A: 10.0000 mg | EXTENDED_RELEASE_TABLET | Freq: Two times a day (BID) | ORAL | 0 refills | Status: DC

## 2019-03-21 NOTE — Progress Notes (Signed)
Gynecologic Oncology Follow-up Note: Gyn-Onc  Chief Complaint:  Chief Complaint  Patient presents with  . endometrial cancer    Assessment/Plan:  Christina. [name]  is a 58 y.o.  year old with grade 3 endometrial cancer (clinical stage I on repeat imaging), a complex incisional abdominal hernia, obesity (BMI 40kg/m2) and complex medical and surgical history. She has a poor performance status (ECOG 2-3) due to her comorbidities and recent pneumonia. Given the complexity of her surgical history and the complexity of the required procedure, I do not think that she is a good candidate for surgery at this time. I am recommending primary definitive radiation with external beam and brachytherapy (heyman's capsules). I feel this will give [place] the best chance of proceeding with therapy now, which is important given that she has already had delay in therapy for 3 months due to medical illness.  After hearing my explanation of the treatment and rationale, she is in agreement.   I believe her pelvic pains are secondary to uterine cramping from her cancer. I feel they will likely improve with therapy. I have prescribed 10 tabs of oxycodone to take in the interim. I counseled her about side effects including addiction, constipation, nausea.  I also talked about altered mental status.  She revealed to me that she has not been taking her Megace and I recommended that she continue to do this until she starts radiation therapy.  HPI: [name] is a 58 year old P0 who is seen in consultation at the request of Dr Benjie Karvonen for grade 3 endometrial cancer.    The patient reports history of stomach pains for approximately 2 years.  She feels that these began after her last surgery for hernias and reversal of a Hartman's pouch.  She also reports intermittent vaginal spotting for at least 2 years.  Due to her persistent cramping abdominal pain she underwent a CT scan of the abdomen and pelvis on September 22, 2018.   This revealed no acute findings or explanation for the patient's symptoms.  There is a stable periumbilical hernia containing small bowel but no evidence of bowel incarceration or obstruction.  Uterine fibroids were present with prominence of the endometrium for age.  She was then seen by her gynecologist who performed an endometrial [place] biopsy on 30 19th 2020.  This revealed FIGO grade 3 endometrioid adenocarcinoma.  P53 negative stains.  ER and PR were negative.  The patient has a complex medical history including history of COPD and asthma from a smoking history.  She also has a history of diabetes mellitus type 2.  Her last hemoglobin A1c in January 2020 was normal at 6.3.  She has chronic pain and takes Ultram and Klonopin.  She is morbidly obese with a BMI of 40 kg/m.  Her surgical history is also complex.  She has a remote history of a cholecystectomy.  When she was a 17 year old child she underwent an exploratory laparotomy with right salpingo-oophorectomy for an ovarian cyst.   In 2010 she experienced ruptured diverticulitis and a Hartman's procedure was performed at [facility].  She developed a parastomal hernia with small bowel obstruction and incarceration in February 2014 and was taken to the operating room at [facility].  The operative note reveals 1 hour of complex adhesio lysis and primary repair of the hernia.  She was in the ICU with pneumonia for 2 weeks following the procedure.  In August 2014 she was taken back to the operating room for a  laparotomy and extensive lysis of adhesions again and takedown of the ostomy with 3 anastomosis and closure of the parastomal hernia and ventral abdominal hernia primarily (no mesh was used).  Postoperatively from the surgery she developed surgical site infection in the incision with wound breakdown requiring a wound VAC.  The patient was offered either primary surgery (hysterectomy, BSO, hernia repair) versus radiation and elected to proceed with  surgery. She saw Dr Johney Maine in March, 2020 for an evaluation for a planned combined procedure (as hernia repair would be necessary at the time of hysterectomy - not elective). Dr Johney Maine explained the procedure but felt that she needed significant optimizaton and evaluation prior to proceeding given her complex medical history. During that intervening time, she developed pneumonia (non-COVID), which left her quite debilitated.  She received "clearance" from Dr Gwenlyn Found (cardiology) as "low risk" for an open procedure.  The patient elected to not proceed with surgery in May/June due to feeling too weak and fatigued from her illness.  A repeat CT scan was performed on 03/12/29 which showed a bulky, heterogeneous uterus, but no gross extrauterine disease (clinical stage I disease).  She continued to have bleeding symptoms and cramping pain.  Interval Hx:  We discussed the CT scan and made plans for definitive management of her cancer. She elected for definitive radiation because she reported feeling too weak for major open surgery. She didn't think she would make it, or that her husband (also of poor health) could care for her postop.  She returned today with complaints of pelvic pains that the tramadol weren't helping. Central deep pelvic pains, constant.  She has not been taking Megace as prescribed by Dr Benjie Karvonen.    Current Meds:  Outpatient Encounter Medications as of 03/21/2019  Medication Sig  . atorvastatin (LIPITOR) 40 MG tablet Take 40 mg by mouth daily at 6 PM.   . clonazePAM (KLONOPIN) 1 MG tablet Take 1 tab twice daily  . dicyclomine (BENTYL) 10 MG capsule Take 10 mg by mouth as needed for spasms.  Marland Kitchen FLUoxetine (PROZAC) 40 MG capsule Take 1 capsule (40 mg total) by mouth daily.  Marland Kitchen HYDROcodone-acetaminophen (NORCO/VICODIN) 5-325 MG tablet Take 1 tablet by mouth every 6 (six) hours as needed for moderate pain.  Marland Kitchen lamoTRIgine (LAMICTAL) 150 MG tablet Take 1 tablet (150 mg total) by mouth 2 (two)  times daily.  Marland Kitchen lisinopril (PRINIVIL,ZESTRIL) 20 MG tablet TK 1 T PO D  . metFORMIN (GLUCOPHAGE) 1000 MG tablet Take 1,000 mg by mouth 2 (two) times daily with a meal.   . omeprazole (PRILOSEC) 40 MG capsule TK 1 C PO D  . ondansetron (ZOFRAN) 8 MG tablet Take 8 mg by mouth as directed.   Marland Kitchen oxyCODONE (OXYCONTIN) 10 mg 12 hr tablet Take 1 tablet (10 mg total) by mouth every 12 (twelve) hours.  . prazosin (MINIPRESS) 5 MG capsule Take 1 capsule (5 mg total) by mouth at bedtime.  . traMADol (ULTRAM) 50 MG tablet Take 100 mg by mouth as needed. Patient states she takes two pills every 8 hours as needed for pain.  . trazodone (DESYREL) 300 MG tablet Take 1 tablet (300 mg total) by mouth at bedtime.  . VENTOLIN HFA 108 (90 BASE) MCG/ACT inhaler Inhale 2 puffs into the lungs every 6 (six) hours as needed for wheezing or shortness of breath.   . [DISCONTINUED] megestrol (MEGACE) 40 MG tablet TAKE 2 TABLETS (80 MG TOTAL) BY MOUTH 2 (TWO) TIMES DAILY.  . [DISCONTINUED] oxyCODONE (OXY  IR/ROXICODONE) 5 MG immediate release tablet Take 1 tablet (5 mg total) by mouth every 4 (four) hours as needed for severe pain. Do not take with tramadol, do not take and drive   No facility-administered encounter medications on file as of 03/21/2019.     Allergy:  Allergies  Allergen Reactions  . Tramadol     Per Dr. Sharyon Medicus is renal failure, patient states that she is currently taking this medication and she has not had a reaction to Tramadol since this time.    Social Hx:   Social History   Socioeconomic History  . Marital status: Married    Spouse name: Not on file  . Number of children: Not on file  . Years of education: Not on file  . Highest education level: Not on file  Occupational History  . Not on file  Social Needs  . Financial resource strain: Not on file  . Food insecurity    Worry: Never true    Inability: Never true  . Transportation needs    Medical: No    Non-medical: No   Tobacco Use  . Smoking status: Current Some Day Smoker    Packs/day: 0.50    Types: Cigarettes  . Smokeless tobacco: Never Used  . Tobacco comment: about one pack a day on a bad day  Substance and Sexual Activity  . Alcohol use: No  . Drug use: No  . Sexual activity: Never    Birth control/protection: None  Lifestyle  . Physical activity    Days per week: Not on file    Minutes per session: Not on file  . Stress: Not on file  Relationships  . Social Herbalist on phone: Not on file    Gets together: Not on file    Attends religious service: Not on file    Active member of club or organization: Not on file    Attends meetings of clubs or organizations: Not on file    Relationship status: Not on file  . Intimate partner violence    Fear of current or ex partner: Not on file    Emotionally abused: Not on file    Physically abused: Not on file    Forced sexual activity: Not on file  Other Topics Concern  . Not on file  Social History Narrative  . Not on file    Past Surgical Hx:  Past Surgical History:  Procedure Laterality Date  . ABDOMINAL HYSTERECTOMY    . APPENDECTOMY    . APPLICATION OF WOUND VAC N/A 11/01/2012   Procedure: APPLICATION OF WOUND VAC;  Surgeon: Joyice Faster. Cornett, MD;  Location: [facility];  Service: General;  Laterality: N/A;  . CHOLECYSTECTOMY    . COLOSTOMY    . COLOSTOMY CLOSURE N/A 11/01/2012   Procedure: COLOSTOMY CLOSURE;  Surgeon: Joyice Faster. Cornett, MD;  Location: [facility];  Service: General;  Laterality: N/A;  REPAIR PARASTOMAL HERNIA; REVISION OF COLOSTOMY  . COLOSTOMY CLOSURE N/A 05/03/2013   Procedure: COLOSTOMY CLOSURE;  Surgeon: Joyice Faster. Cornett, MD;  Location: [place];  Service: General;  Laterality: N/A;  . ESOPHAGOGASTRODUODENOSCOPY Left 11/27/2012   Procedure: ESOPHAGOGASTRODUODENOSCOPY (EGD);  Surgeon: Winfield Cunas., MD;  Location: [facility];  Service: Gastroenterology;  Laterality: Left;  . INCISIONAL HERNIA REPAIR N/A  11/01/2012   Procedure: HERNIA REPAIR INCISIONAL;  Surgeon: Joyice Faster. Cornett, MD;  Location: [facility];  Service: General;  Laterality: N/A;  . LAPAROTOMY  11/01/2012   Procedure:  EXPLORATORY LAPAROTOMY;  Surgeon: Joyice Faster. Cornett, MD;  Location: [facility];  Service: General;;  exploratory laparotomy,repair of parastomal hernia and incisional hernia, lysis of adhesions, and application of wound V.A.C.  . LYSIS OF ADHESION N/A 11/01/2012   Procedure: LYSIS OF ADHESION;  Surgeon: Joyice Faster. Cornett, MD;  Location: [facility];  Service: General;  Laterality: N/A;  . PARASTOMAL HERNIA REPAIR N/A 11/01/2012   Procedure: HERNIA REPAIR PARASTOMAL;  Surgeon: Joyice Faster. Cornett, MD;  Location: [facility];  Service: General;  Laterality: N/A;  . PARTIAL HYSTERECTOMY     partial    Past Medical Hx:  Past Medical History:  Diagnosis Date  . Acid reflux disease   . Anxiety   . Asthma   . Bronchitis, chronic (HCC)    Effects worse with URI  . COPD (chronic obstructive pulmonary disease) ([place])   . Depression   . 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, type 2  . Diverticulitis   . Dysplasia of cervix (uteri)    patient states she had dysplasia of the uterus stage 4  . Fibromyalgia   . Fibromyalgia   . GERD (gastroesophageal reflux disease)   . Headache(784.0)    migraine headache  . Kidney stones    at least 6 stones in past  . Pneumonia 2019  . Restless leg syndrome    Bilateral  . Vomiting    last night at 2300 after last round of antibiotic    Past Gynecological History:  G0, no history of abnormal paps.  Patient's last menstrual period was 04/20/2013.  Family Hx:  Family History  Problem Relation Age of Onset  . Diabetes Mother   . Throat cancer Mother   . Uterine cancer Sister   . Deep vein thrombosis Neg Hx   . Pulmonary embolism Neg Hx     Review of Systems:  Constitutional  Feels well,    ENT Normal appearing ears and nares bilaterally Skin/Breast  No rash,  sores, jaundice, itching, dryness Cardiovascular  No chest pain, shortness of breath, or edema  Pulmonary  No cough or wheeze.  Gastro Intestinal  No nausea, vomitting, or diarrhoea. No bright red blood per rectum, no abdominal pain, change in bowel movement, or constipation.  Genito Urinary  No frequency, urgency, dysuria, + postmenopausal bleeding.  Musculo Skeletal  No myalgia, arthralgia, joint swelling or pain  Neurologic  No weakness, numbness, change in gait,  Psychology  No depression, anxiety, insomnia.   Vitals:  Blood pressure (!) 105/58, pulse 98, temperature 99.2 F (37.3 C), temperature source Oral, resp. rate 18, height '5\' 4"'  (1.626 m), weight 226 lb 14.4 oz (102.9 kg), last menstrual period 04/20/2013, SpO2 97 %.  Physical Exam: Physical Exam: WD in NAD Neck  Supple NROM, without any enlargements.  Lymph Node Survey No cervical supraclavicular or inguinal adenopathy Cardiovascular  Pulse normal rate, regularity and rhythm. S1 and S2 normal.  Lungs  Clear to auscultation bilateraly, without wheezes/crackles/rhonchi. Good air movement.  Skin  No rash/lesions/breakdown  Psychiatry  Alert and oriented to person, place, and time  Abdomen  Normoactive bowel sounds, abdomen soft, non-tender and obese without evidence of hernia.  Back No CVA tenderness Genito Urinary  Vulva/vagina: Normal external female genitalia.   No lesions. No discharge or bleeding.  Bladder/urethra:  No lesions or masses, well supported bladder  Vagina: normal  Cervix: Normal appearing, no lesions.  Uterus:  Small, mobile, no parametrial involvement or nodularity.  Adnexa: no palpable masses. Rectal  deferred Extremities  No bilateral cyanosis, clubbing or edema.  Thereasa Solo, MD  03/21/2019, 2:09 PM

## 2019-03-21 NOTE — Patient Instructions (Signed)
Coronavirus (COVID-19) Are you at risk?  Are you at risk for the Coronavirus (COVID-19)?  To be considered HIGH RISK for Coronavirus (COVID-19), you have to meet the following criteria:  . Traveled to [place], [place], [place], [place] or [place]; or in the [place] to [place], [place], [place], or [place]; and have fever, cough, and shortness of breath within the last 2 weeks of travel OR . Been in close contact with a person diagnosed with COVID-19 within the last 2 weeks and have fever, cough, and shortness of breath . IF YOU DO NOT MEET THESE CRITERIA, YOU ARE CONSIDERED LOW RISK FOR COVID-19.  What to do if you are HIGH RISK for COVID-19?  . If you are having a medical emergency, call 911. . Seek medical care right away. Before you go to a doctor's office, urgent care or emergency department, call ahead and tell them about your recent travel, contact with someone diagnosed with COVID-19, and your symptoms. You should receive instructions from your physician's office regarding next steps of care.  . When you arrive at healthcare provider, tell the healthcare staff immediately you have returned from visiting [place], [place], [place], [place] or [place]; or traveled in the [place] to [place], [place], [place], or [place]; in the last two weeks or you have been in close contact with a person diagnosed with COVID-19 in the last 2 weeks.   . Tell the health care staff about your symptoms: fever, cough and shortness of breath. . After you have been seen by a medical provider, you will be either: o Tested for (COVID-19) and discharged home on quarantine except to seek medical care if symptoms worsen, and asked to  - Stay home and avoid contact with others until you get your results (4-5 days)  - Avoid travel on public transportation if possible (such as bus, train, or airplane) or o Sent to the Emergency Department by EMS for evaluation, COVID-19 testing, and possible  admission depending on your condition and test results.  What to do if you are LOW RISK for COVID-19?  Reduce your risk of any infection by using the same precautions used for avoiding the common cold or flu:  . Wash your hands often with soap and warm water for at least 20 seconds.  If soap and water are not readily available, use an alcohol-based hand sanitizer with at least 60% alcohol.  . If coughing or sneezing, cover your mouth and nose by coughing or sneezing into the elbow areas of your shirt or coat, into a tissue or into your sleeve (not your hands). . Avoid shaking hands with others and consider head nods or verbal greetings only. . Avoid touching your eyes, nose, or mouth with unwashed hands.  . Avoid close contact with people who are sick. . Avoid places or events with large numbers of people in one location, like concerts or sporting events. . Carefully consider travel plans you have or are making. . If you are planning any travel outside or inside the [place], visit the CDC's Travelers' Health webpage for the latest health notices. . If you have some symptoms but not all symptoms, continue to monitor at home and seek medical attention if your symptoms worsen. . If you are having a medical emergency, call 911.   ADDITIONAL HEALTHCARE OPTIONS FOR PATIENTS  [facility] / e-Visit: [email or web address]         [facility]: [phone]  [facility]: [phone]                   [facility]: [phone]   

## 2019-03-21 NOTE — Progress Notes (Signed)
Radiation Oncology         [phone] ________________________________  Initial Outpatient Consultation  Name: [name] MRN: [id number]  Date: 03/21/2019  DOB: 06-22-1961  ZY:YQMG, Christina Saxon, MD  [name], MD   REFERRING PHYSICIAN: [name], MD  DIAGNOSIS: The encounter diagnosis was Endometrial cancer [facility]).   Clinical stage I,  grade 3 endometrial cancer  HISTORY OF PRESENT ILLNESS::[name] is a 58 y.o. female who is accompanied by . The patient presented with abdominal pain and intermittent vaginal spotting for approximately 2 years. She initially contributed the abdominal pain to her surgery for hernias and reversal of a Hartman's pouch. Since the cramping pain persisted, she underwent abdomen/pelvis CT on 09/22/2018 which showed uterine fibroids with prominence of the endometrium.   Her gyneocologist performed an endometrial [place] biopsy on 10/08/2018. Pathology revealed FIGO grade 3 endometrioid adenocarcinoma; P53 negative stains, ER and PR negative.  She initially opted to undergo surgery, but she developed pneumonia during the interim, which left her feeling too weak to undergo surgery.  Given the patient's poor performance status Comorbidities and Recent Pneumonia she would be a poor risk for surgery and the patient [name] felt that definitive radiation therapy would be a better option for the patient.  PREVIOUS RADIATION THERAPY: No  PAST MEDICAL HISTORY:  has a past medical history of Acid reflux disease, Anxiety, Asthma, Bronchitis, chronic (HCC), COPD (chronic obstructive pulmonary disease) ([place]), Depression, Diabetes mellitus without complication ([place]), Diverticulitis, Dysplasia of cervix (uteri), Fibromyalgia, Fibromyalgia, GERD (gastroesophageal reflux disease), Headache(784.0), Kidney stones, Pneumonia (2019), Restless leg syndrome, and Vomiting.    PAST SURGICAL HISTORY: Past Surgical History:  Procedure Laterality Date   ABDOMINAL  HYSTERECTOMY     APPENDECTOMY     APPLICATION OF WOUND VAC N/A 11/01/2012   Procedure: APPLICATION OF WOUND VAC;  Surgeon: Joyice Faster. Cornett, MD;  Location: [facility];  Service: General;  Laterality: N/A;   CHOLECYSTECTOMY     COLOSTOMY     COLOSTOMY CLOSURE N/A 11/01/2012   Procedure: COLOSTOMY CLOSURE;  Surgeon: Joyice Faster. Cornett, MD;  Location: [facility];  Service: General;  Laterality: N/A;  REPAIR PARASTOMAL HERNIA; REVISION OF COLOSTOMY   COLOSTOMY CLOSURE N/A 05/03/2013   Procedure: COLOSTOMY CLOSURE;  Surgeon: Joyice Faster. Cornett, MD;  Location: [place];  Service: General;  Laterality: N/A;   ESOPHAGOGASTRODUODENOSCOPY Left 11/27/2012   Procedure: ESOPHAGOGASTRODUODENOSCOPY (EGD);  Surgeon: Winfield Cunas., MD;  Location: [facility];  Service: Gastroenterology;  Laterality: Left;   INCISIONAL HERNIA REPAIR N/A 11/01/2012   Procedure: HERNIA REPAIR INCISIONAL;  Surgeon: Joyice Faster. Cornett, MD;  Location: [facility];  Service: General;  Laterality: N/A;   LAPAROTOMY  11/01/2012   Procedure: EXPLORATORY LAPAROTOMY;  Surgeon: Joyice Faster. Cornett, MD;  Location: [facility];  Service: General;;  exploratory laparotomy,repair of parastomal hernia and incisional hernia, lysis of adhesions, and application of wound V.A.C.   LYSIS OF ADHESION N/A 11/01/2012   Procedure: LYSIS OF ADHESION;  Surgeon: Joyice Faster. Cornett, MD;  Location: [facility];  Service: General;  Laterality: N/A;   PARASTOMAL HERNIA REPAIR N/A 11/01/2012   Procedure: HERNIA REPAIR PARASTOMAL;  Surgeon: Joyice Faster. Cornett, MD;  Location: [facility];  Service: General;  Laterality: N/A;   PARTIAL HYSTERECTOMY     partial    FAMILY HISTORY: family history includes Diabetes in her mother; Throat cancer in her mother; Uterine cancer in her sister.  SOCIAL HISTORY:  reports that she has been smoking cigarettes. She has been  smoking about 0.50 packs per day. She has never used smokeless tobacco. She reports that she does not drink alcohol or use  drugs.  ALLERGIES: Tramadol  MEDICATIONS:  Current Outpatient Medications  Medication Sig Dispense Refill   atorvastatin (LIPITOR) 40 MG tablet Take 40 mg by mouth daily at 6 PM.      clonazePAM (KLONOPIN) 1 MG tablet Take 1 tab twice daily 60 tablet 2   dicyclomine (BENTYL) 10 MG capsule Take 10 mg by mouth as needed for spasms.     FLUoxetine (PROZAC) 40 MG capsule Take 1 capsule (40 mg total) by mouth daily. 90 capsule 0   HYDROcodone-acetaminophen (NORCO/VICODIN) 5-325 MG tablet Take 1 tablet by mouth every 6 (six) hours as needed for moderate pain.     lamoTRIgine (LAMICTAL) 150 MG tablet Take 1 tablet (150 mg total) by mouth 2 (two) times daily. 180 tablet 0   lisinopril (PRINIVIL,ZESTRIL) 20 MG tablet TK 1 T PO D  3   metFORMIN (GLUCOPHAGE) 1000 MG tablet Take 1,000 mg by mouth 2 (two) times daily with a meal.      omeprazole (PRILOSEC) 40 MG capsule TK 1 C PO D  6   ondansetron (ZOFRAN) 8 MG tablet Take 8 mg by mouth as directed.   2   oxyCODONE (OXYCONTIN) 10 mg 12 hr tablet Take 1 tablet (10 mg total) by mouth every 12 (twelve) hours. 10 tablet 0   prazosin (MINIPRESS) 5 MG capsule Take 1 capsule (5 mg total) by mouth at bedtime. 90 capsule 0   traMADol (ULTRAM) 50 MG tablet Take 100 mg by mouth as needed. Patient states she takes two pills every 8 hours as needed for pain.  2   trazodone (DESYREL) 300 MG tablet Take 1 tablet (300 mg total) by mouth at bedtime. 90 tablet 0   VENTOLIN HFA 108 (90 BASE) MCG/ACT inhaler Inhale 2 puffs into the lungs every 6 (six) hours as needed for wheezing or shortness of breath.      No current facility-administered medications for this encounter.     REVIEW OF SYSTEMS:  A 10+ POINT REVIEW OF SYSTEMS WAS OBTAINED including neurology, dermatology, psychiatry, cardiac, respiratory, lymph, extremities, GI, GU, musculoskeletal, constitutional, reproductive, HEENT. She denies hematuria, rectal bleeding, and abdominal bloating. She  reports dysuria, scant brown vaginal discharge, uterine/vaginal pain rated a 10/10. She describes the dysuria as "on the inside, not the outside," and the discharge as "slimy and sticky." She reports diarrhea at baseline. She describes her pain as cramping, sharp, and constant, and is worst with vaginal bleeding. She states the pain began approximately 2 months ago and causes constant nausea and vomiting 1-2 times per week. She is tearful and frustrated from pain.   PHYSICAL EXAM:  height is 5' 4" (1.626 m) and weight is 226 lb (102.5 kg). Her oral temperature is 99.2 F (37.3 C). Her blood pressure is 105/58 (abnormal) and her pulse is 98. Her respiration is 18 and oxygen saturation is 97%.   General: Alert and oriented, in no acute distress HEENT: Head is normocephalic. Extraocular movements are intact. Oropharynx is clear. Neck: Neck is supple, no palpable cervical or supraclavicular lymphadenopathy. Heart: Regular in rate and rhythm with no murmurs, rubs, or gallops. Chest: Clear to auscultation bilaterally, with no rhonchi, wheezes, or rales. Abdomen: Soft, nontender, nondistended, with no rigidity or guarding. Extremities: No cyanosis or edema. Lymphatics: see Neck Exam Skin: No concerning lesions. Musculoskeletal: symmetric strength and muscle tone throughout. Neurologic: Cranial nerves  II through XII are grossly intact. No obvious focalities. Speech is fluent. Coordination is intact. Psychiatric: Judgment and insight are intact. Affect is appropriate. Pelvic exam not performed this afternoon in light of her exam earlier today by Dr. Luanna Salk = 2-3   LABORATORY DATA:  Lab Results  Component Value Date   WBC 10.8 (H) 07/01/2015   HGB 16.1 (H) 07/01/2015   HCT 48.4 (H) 07/01/2015   MCV 88.6 07/01/2015   PLT 273 07/01/2015   NEUTROABS 7.4 05/18/2013   Lab Results  Component Value Date   NA 141 03/09/2019   K 5.1 03/09/2019   CL 106 03/09/2019   CO2 27 03/09/2019   GLUCOSE  120 (H) 03/09/2019   CREATININE 0.81 03/09/2019   CALCIUM 9.3 03/09/2019      RADIOGRAPHY: Ct Abdomen Pelvis W Contrast  Result Date: 03/13/2019 CLINICAL DATA:  Endometrial cancer, diagnosed February 2020. Right lower quadrant pain with vaginal bleeding. History of partial colectomy status post reversal for diverticulitis. Prior oophorectomy, cholecystectomy, and appendectomy. EXAM: CT ABDOMEN AND PELVIS WITH CONTRAST TECHNIQUE: Multidetector CT imaging of the abdomen and pelvis was performed using the standard protocol following bolus administration of intravenous contrast. CONTRAST:  138m OMNIPAQUE IOHEXOL 300 MG/ML  SOLN COMPARISON:  09/22/2018 FINDINGS: Lower chest: Lung bases are clear. Hepatobiliary: Liver is within normal limits. Status post cholecystectomy. No intrahepatic ductal dilatation. Common duct measures 9 mm and smoothly tapers at the ampulla. Pancreas: Within normal limits. Spleen: Within normal limits. Adrenals/Urinary Tract: Adrenal glands are within normal limits. 3 mm nonobstructing right lower pole renal calculus (series 2/image 39). Left kidney is within normal limits. No hydronephrosis. Bladder is underdistended but unremarkable. Stomach/Bowel: Stomach is within normal limits. Right paramidline periumbilical hernia containing loops of nondilated small bowel (series 2/image 59), unchanged. Additional small infraumbilical midline ventral hernia containing loops of nondilated small bowel (series 2/image 70), unchanged. No evidence of bowel obstruction. Prior appendectomy. Status post left hemicolectomy with suture line in the lower pelvis (series 2/image 65). No colonic wall thickening or inflammatory changes. Vascular/Lymphatic: No evidence of abdominal aortic aneurysm. Atherosclerotic calcifications of the abdominal aorta and branch vessels. No suspicious abdominopelvic lymphadenopathy. Reproductive: Heterogeneous uterus, likely reflecting uterine fibroids, although with concomitant  thickened endometrium (sagittal image 71) in this patient with known endometrial cancer. Left ovary is within normal limits. Residual right ovarian tissue is suspected (series 2/image 69) in this patient with reported history of oophorectomy. Other: No abdominopelvic ascites. No peritoneal nodularity or omental caking is evident on CT. Musculoskeletal: Mild degenerative changes of the lower thoracic spine. IMPRESSION: Heterogeneous uterus with thickened endometrium, corresponding to the patient's known endometrial cancer. No findings suspicious for metastatic disease. Status post cholecystectomy, appendectomy, and left hemicolectomy. 3 mm nonobstructing right lower pole renal calculus. No hydronephrosis. Additional ancillary findings as above. Electronically Signed   By: SJulian HyM.D.   On: 03/13/2019 13:28      IMPRESSION: Clinical stage I,  grade 3 endometrial cancer (inoperable). Given the patients Performance status and comorbidities she is felt to be at high risk for surgery and therefore considered inoperable.  She would be a good candidate for a definitive course of radiation therapy including external beam radiation therapy and brachii therapy (with Heyman capsules).   We discussed the available radiation techniques, and focused on the details of logistics and delivery.  We reviewed the anticipated acute and late sequelae associated with radiation in this setting.  The patient was encouraged to ask questions that I answered  to the best of my ability.  A patient consent form was discussed and signed.  We retained a copy for our records.  The patient would like to proceed with radiation and will be scheduled for CT simulation.  PLAN: CT simulation next few days with treatment to begin soon afterward.  Anticipate 45 Gy directed at the pelvis region.  This will be followed by 2 brachii therapy treatments directed at the endometrium/uterus using iridium 192 as the high-dose-rate source with Heyman  capsules as the route to deliver the radiation treatment.    ------------------------------------------------  Blair Promise, PhD, MD  This document serves as a record of services personally performed by Gery Pray, MD. It was created on his behalf by Wilburn Mylar, a trained medical scribe. The creation of this record is based on the scribe's personal observations and the provider's statements to them. This document has been checked and approved by the attending provider.

## 2019-03-21 NOTE — Patient Instructions (Signed)
Dr [name] has prescribed 10 tablets of oxycodone. Take these every 4 hours as needed for severe pain.

## 2019-03-22 ENCOUNTER — Other Ambulatory Visit (HOSPITAL_COMMUNITY): Payer: Self-pay | Admitting: Psychiatry

## 2019-03-22 ENCOUNTER — Other Ambulatory Visit: Payer: Self-pay | Admitting: Radiation Oncology

## 2019-03-22 ENCOUNTER — Telehealth: Payer: Self-pay

## 2019-03-22 DIAGNOSIS — F4310 Post-traumatic stress disorder, unspecified: Secondary | ICD-10-CM

## 2019-03-22 DIAGNOSIS — F331 Major depressive disorder, recurrent, moderate: Secondary | ICD-10-CM

## 2019-03-22 DIAGNOSIS — F411 Generalized anxiety disorder: Secondary | ICD-10-CM

## 2019-03-22 MED ORDER — MORPHINE SULFATE ER 15 MG PO TBCR: 15.0000 mg | EXTENDED_RELEASE_TABLET | Freq: Two times a day (BID) | ORAL | 0 refills | Status: DC

## 2019-03-22 NOTE — Telephone Encounter (Signed)
Pt calling to report that pain medication prescribed yesterday was expensive "its $50 for those 10 pills".  Conveyed to pt that pain medication was not prescribed by Dr. Sondra Come and instructed pt to contact Dr. Serita Grit office for resolution. Pt verbalized understanding. Loma Sousa, RN BSN

## 2019-03-26 ENCOUNTER — Ambulatory Visit
Admission: RE | Admit: 2019-03-26 | Discharge: 2019-03-26 | Disposition: A | Discharge status: Home / Self Care | Payer: HMO | Source: Ambulatory Visit | Attending: Radiation Oncology | Admitting: Radiation Oncology

## 2019-03-26 ENCOUNTER — Other Ambulatory Visit: Payer: Self-pay

## 2019-03-26 DIAGNOSIS — R300 Dysuria: Secondary | ICD-10-CM | POA: Insufficient documentation

## 2019-03-26 DIAGNOSIS — Z510 Encounter for antineoplastic radiation therapy: Secondary | ICD-10-CM | POA: Insufficient documentation

## 2019-03-26 DIAGNOSIS — C541 Malignant neoplasm of endometrium: Secondary | ICD-10-CM | POA: Insufficient documentation

## 2019-03-26 NOTE — Progress Notes (Signed)
  Radiation Oncology         [phone] ________________________________  Name: [name] MRN: [id number]  Date: 03/26/2019  DOB: 25-Mar-1961  SIMULATION AND TREATMENT PLANNING NOTE    ICD-10-CM   1. Endometrial cancer ([place])  C54.1     DIAGNOSIS:  Clinical stage I,  grade 3 endometrial cancer (Inoperable)  NARRATIVE:  The patient was brought to the [place].  Identity was confirmed.  All relevant records and images related to the planned course of therapy were reviewed.  The patient freely provided informed written consent to proceed with treatment after reviewing the details related to the planned course of therapy. The consent form was witnessed and verified by the simulation staff.  Then, the patient was set-up in a stable reproducible  supine position for radiation therapy.  CT images were obtained.  Surface markings were placed.  The CT images were loaded into the planning software.  Then the target and avoidance structures were contoured.  Treatment planning then occurred.  The radiation prescription was entered and confirmed.  Then, I designed and supervised the construction of a total of 5 medically necessary complex treatment devices.  I have requested : 3D Simulation  I have requested a DVH of the following structures: Bladder, rectum, sigmoid colon,  uterus, small bowel.  I have ordered:dose calc.  PLAN:  The patient will receive 45 Gy in 25 fractions directed to the pelvis area.  The patient will then proceed with 2 intracavitary brachii therapy treatments directed at the endometrial cavity/uterus using iridium 192 is the high-dose-rate source. Heyman capsules will be used to deliver the radiation treatment inside the uterus  -----------------------------------  Blair Promise, PhD, MD  This document serves as a record of services personally performed by Gery Pray, MD. It was created on his behalf by Mary-Margaret Loma Messing, a trained medical scribe. The  creation of this record is based on the scribe's personal observations and the provider's statements to them. This document has been checked and approved by the attending provider.

## 2019-03-27 ENCOUNTER — Other Ambulatory Visit: Payer: Self-pay | Admitting: Radiation Oncology

## 2019-03-27 MED ORDER — MORPHINE SULFATE ER 15 MG PO TBCR: 15.0000 mg | EXTENDED_RELEASE_TABLET | Freq: Two times a day (BID) | ORAL | 0 refills | Status: DC

## 2019-03-28 ENCOUNTER — Telehealth: Payer: Self-pay | Admitting: Oncology

## 2019-03-28 ENCOUNTER — Other Ambulatory Visit: Payer: Self-pay | Admitting: Radiation Oncology

## 2019-03-28 DIAGNOSIS — Z510 Encounter for antineoplastic radiation therapy: Secondary | ICD-10-CM | POA: Diagnosis not present

## 2019-03-28 DIAGNOSIS — C541 Malignant neoplasm of endometrium: Secondary | ICD-10-CM | POA: Diagnosis not present

## 2019-03-28 MED ORDER — MORPHINE SULFATE ER 15 MG PO TBCR: 15.0000 mg | EXTENDED_RELEASE_TABLET | Freq: Two times a day (BID) | ORAL | 0 refills | Status: DC

## 2019-03-28 NOTE — Telephone Encounter (Signed)
Called [name] and discussed what side effects to expect with pelvic radiation including fatigue, bladder irritation, diarrhea, hair loss in the treatment area and skin irritation. Advised her that I will meet with her again on her first day of radiation and that she cal call anytime with questions.  She verbalized understanding and agreement.

## 2019-03-28 NOTE — Telephone Encounter (Signed)
[name] called and said [facility] did not receive the morphine prescription from yesterday.  Advised her that Dr. Sondra Come will have to send in another prescription since it didn't go through.  She verbalized agreement and Dr. Sondra Come was notified.

## 2019-04-02 ENCOUNTER — Ambulatory Visit
Admission: RE | Admit: 2019-04-02 | Discharge: 2019-04-02 | Disposition: A | Discharge status: Home / Self Care | Payer: HMO | Source: Ambulatory Visit | Attending: Radiation Oncology | Admitting: Radiation Oncology

## 2019-04-02 ENCOUNTER — Other Ambulatory Visit: Payer: Self-pay

## 2019-04-02 DIAGNOSIS — Z510 Encounter for antineoplastic radiation therapy: Secondary | ICD-10-CM | POA: Diagnosis not present

## 2019-04-02 DIAGNOSIS — C541 Malignant neoplasm of endometrium: Secondary | ICD-10-CM

## 2019-04-02 NOTE — Progress Notes (Signed)
  Radiation Oncology         [phone]) [phone] ________________________________  Name: [name] MRN: [id number]  Date: 04/02/2019  DOB: [birth year]  Simulation Verification Note    ICD-10-CM   1. Endometrial cancer ([place])  C54.1     Status: outpatient  NARRATIVE: The patient was brought to the treatment unit and placed in the planned treatment position. The clinical setup was verified. Then port films were obtained and uploaded to the radiation oncology medical record software.  The treatment beams were carefully compared against the planned radiation fields. The position location and shape of the radiation fields was reviewed. They targeted volume of tissue appears to be appropriately covered by the radiation beams. Organs at risk appear to be excluded as planned.  Based on my personal review, I approved the simulation verification. The patient's treatment will proceed as planned.  -----------------------------------  Blair Promise, PhD, MD  This document serves as a record of services personally performed by Gery Pray, MD. It was created on his behalf by Mary-Margaret Loma Messing, a trained medical scribe. The creation of this record is based on the scribe's personal observations and the provider's statements to them. This document has been checked and approved by the attending provider.

## 2019-04-03 ENCOUNTER — Other Ambulatory Visit: Payer: Self-pay | Admitting: Radiation Oncology

## 2019-04-03 ENCOUNTER — Ambulatory Visit
Admission: RE | Admit: 2019-04-03 | Discharge: 2019-04-03 | Disposition: A | Discharge status: Home / Self Care | Payer: HMO | Source: Ambulatory Visit | Attending: Radiation Oncology | Admitting: Radiation Oncology

## 2019-04-03 ENCOUNTER — Other Ambulatory Visit: Payer: Self-pay

## 2019-04-03 DIAGNOSIS — Z510 Encounter for antineoplastic radiation therapy: Secondary | ICD-10-CM | POA: Diagnosis not present

## 2019-04-03 DIAGNOSIS — C541 Malignant neoplasm of endometrium: Secondary | ICD-10-CM | POA: Diagnosis not present

## 2019-04-03 MED ORDER — PROCHLORPERAZINE MALEATE 10 MG PO TABS: 10.0000 mg | ORAL_TABLET | Freq: Four times a day (QID) | ORAL | 0 refills | Status: DC | PRN

## 2019-04-04 ENCOUNTER — Ambulatory Visit
Admission: RE | Admit: 2019-04-04 | Discharge: 2019-04-04 | Disposition: A | Discharge status: Home / Self Care | Payer: HMO | Source: Ambulatory Visit | Attending: Radiation Oncology | Admitting: Radiation Oncology

## 2019-04-04 ENCOUNTER — Other Ambulatory Visit: Payer: Self-pay

## 2019-04-04 DIAGNOSIS — C541 Malignant neoplasm of endometrium: Secondary | ICD-10-CM | POA: Diagnosis not present

## 2019-04-04 DIAGNOSIS — Z510 Encounter for antineoplastic radiation therapy: Secondary | ICD-10-CM | POA: Diagnosis not present

## 2019-04-05 ENCOUNTER — Other Ambulatory Visit: Payer: Self-pay

## 2019-04-05 ENCOUNTER — Ambulatory Visit
Admission: RE | Admit: 2019-04-05 | Discharge: 2019-04-05 | Disposition: A | Discharge status: Home / Self Care | Payer: HMO | Source: Ambulatory Visit | Attending: Radiation Oncology | Admitting: Radiation Oncology

## 2019-04-05 DIAGNOSIS — Z510 Encounter for antineoplastic radiation therapy: Secondary | ICD-10-CM | POA: Diagnosis not present

## 2019-04-05 DIAGNOSIS — C541 Malignant neoplasm of endometrium: Secondary | ICD-10-CM | POA: Diagnosis not present

## 2019-04-06 ENCOUNTER — Other Ambulatory Visit: Payer: Self-pay

## 2019-04-06 ENCOUNTER — Ambulatory Visit
Admission: RE | Admit: 2019-04-06 | Discharge: 2019-04-06 | Disposition: A | Discharge status: Home / Self Care | Payer: HMO | Source: Ambulatory Visit | Attending: Radiation Oncology | Admitting: Radiation Oncology

## 2019-04-06 DIAGNOSIS — C541 Malignant neoplasm of endometrium: Secondary | ICD-10-CM | POA: Diagnosis not present

## 2019-04-06 DIAGNOSIS — Z510 Encounter for antineoplastic radiation therapy: Secondary | ICD-10-CM | POA: Diagnosis not present

## 2019-04-09 ENCOUNTER — Other Ambulatory Visit: Payer: Self-pay

## 2019-04-09 ENCOUNTER — Telehealth: Payer: Self-pay | Admitting: Oncology

## 2019-04-09 ENCOUNTER — Ambulatory Visit
Admission: RE | Admit: 2019-04-09 | Discharge: 2019-04-09 | Disposition: A | Discharge status: Home / Self Care | Payer: HMO | Source: Ambulatory Visit | Attending: Radiation Oncology | Admitting: Radiation Oncology

## 2019-04-09 DIAGNOSIS — C541 Malignant neoplasm of endometrium: Secondary | ICD-10-CM | POA: Diagnosis not present

## 2019-04-09 DIAGNOSIS — Z510 Encounter for antineoplastic radiation therapy: Secondary | ICD-10-CM | POA: Diagnosis not present

## 2019-04-09 NOTE — Telephone Encounter (Signed)
Requested slides from [facility] for case [id number].  Faxed request to [phone].

## 2019-04-10 ENCOUNTER — Other Ambulatory Visit: Payer: Self-pay

## 2019-04-10 ENCOUNTER — Ambulatory Visit
Admission: RE | Admit: 2019-04-10 | Discharge: 2019-04-10 | Disposition: A | Discharge status: Home / Self Care | Payer: HMO | Source: Ambulatory Visit | Attending: Radiation Oncology | Admitting: Radiation Oncology

## 2019-04-10 DIAGNOSIS — R300 Dysuria: Secondary | ICD-10-CM

## 2019-04-10 DIAGNOSIS — Z510 Encounter for antineoplastic radiation therapy: Secondary | ICD-10-CM | POA: Diagnosis not present

## 2019-04-10 DIAGNOSIS — C541 Malignant neoplasm of endometrium: Secondary | ICD-10-CM | POA: Diagnosis not present

## 2019-04-10 LAB — URINALYSIS, COMPLETE (UACMP) WITH MICROSCOPIC
Bilirubin Urine: NEGATIVE
Glucose, UA: NEGATIVE mg/dL
Ketones, ur: NEGATIVE mg/dL
Nitrite: NEGATIVE
Protein, ur: 30 mg/dL — AB
RBC / HPF: >50 RBC/hpf — ABNORMAL HIGH (ref 0–5)
Specific Gravity, Urine: 1.025 (ref 1.005–1.030)
pH: 5 (ref 5.0–8.0)

## 2019-04-11 ENCOUNTER — Other Ambulatory Visit: Payer: Self-pay

## 2019-04-11 ENCOUNTER — Ambulatory Visit
Admission: RE | Admit: 2019-04-11 | Discharge: 2019-04-11 | Disposition: A | Discharge status: Home / Self Care | Payer: HMO | Source: Ambulatory Visit | Attending: Radiation Oncology | Admitting: Radiation Oncology

## 2019-04-11 DIAGNOSIS — Z510 Encounter for antineoplastic radiation therapy: Secondary | ICD-10-CM | POA: Diagnosis not present

## 2019-04-11 DIAGNOSIS — C541 Malignant neoplasm of endometrium: Secondary | ICD-10-CM | POA: Diagnosis not present

## 2019-04-11 LAB — URINE CULTURE: Culture: 20000 — AB

## 2019-04-12 ENCOUNTER — Ambulatory Visit (INDEPENDENT_AMBULATORY_CARE_PROVIDER_SITE_OTHER): Payer: HMO | Admitting: Psychiatry

## 2019-04-12 ENCOUNTER — Other Ambulatory Visit: Payer: Self-pay

## 2019-04-12 ENCOUNTER — Ambulatory Visit
Admission: RE | Admit: 2019-04-12 | Discharge: 2019-04-12 | Disposition: A | Discharge status: Home / Self Care | Payer: HMO | Source: Ambulatory Visit | Attending: Radiation Oncology | Admitting: Radiation Oncology

## 2019-04-12 ENCOUNTER — Encounter (HOSPITAL_COMMUNITY): Payer: Self-pay | Admitting: Psychiatry

## 2019-04-12 DIAGNOSIS — F331 Major depressive disorder, recurrent, moderate: Secondary | ICD-10-CM

## 2019-04-12 DIAGNOSIS — F411 Generalized anxiety disorder: Secondary | ICD-10-CM | POA: Diagnosis not present

## 2019-04-12 DIAGNOSIS — C541 Malignant neoplasm of endometrium: Secondary | ICD-10-CM | POA: Diagnosis not present

## 2019-04-12 DIAGNOSIS — F4310 Post-traumatic stress disorder, unspecified: Secondary | ICD-10-CM

## 2019-04-12 DIAGNOSIS — R300 Dysuria: Secondary | ICD-10-CM

## 2019-04-12 DIAGNOSIS — Z510 Encounter for antineoplastic radiation therapy: Secondary | ICD-10-CM | POA: Diagnosis not present

## 2019-04-12 MED ORDER — CLONAZEPAM 1 MG PO TABS: ORAL_TABLET | ORAL | 2 refills | Status: DC

## 2019-04-12 MED ORDER — LAMOTRIGINE 150 MG PO TABS: 150.0000 mg | ORAL_TABLET | Freq: Two times a day (BID) | ORAL | 0 refills | Status: DC

## 2019-04-12 MED ORDER — FLUOXETINE HCL 40 MG PO CAPS: 40.0000 mg | ORAL_CAPSULE | Freq: Every day | ORAL | 0 refills | Status: DC

## 2019-04-12 MED ORDER — TRAZODONE HCL 300 MG PO TABS: 300.0000 mg | ORAL_TABLET | Freq: Every day | ORAL | 0 refills | Status: DC

## 2019-04-12 MED ORDER — PRAZOSIN HCL 5 MG PO CAPS: 5.0000 mg | ORAL_CAPSULE | Freq: Every day | ORAL | 0 refills | Status: DC
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
## 2019-04-13 ENCOUNTER — Ambulatory Visit
Admission: RE | Admit: 2019-04-13 | Discharge: 2019-04-13 | Disposition: A | Discharge status: Home / Self Care | Payer: HMO | Source: Ambulatory Visit | Attending: Radiation Oncology | Admitting: Radiation Oncology

## 2019-04-13 ENCOUNTER — Other Ambulatory Visit: Payer: Self-pay

## 2019-04-13 DIAGNOSIS — R300 Dysuria: Secondary | ICD-10-CM

## 2019-04-13 DIAGNOSIS — C541 Malignant neoplasm of endometrium: Secondary | ICD-10-CM | POA: Diagnosis not present

## 2019-04-13 DIAGNOSIS — Z510 Encounter for antineoplastic radiation therapy: Secondary | ICD-10-CM | POA: Diagnosis not present

## 2019-04-13 LAB — URINALYSIS, COMPLETE (UACMP) WITH MICROSCOPIC
Bilirubin Urine: NEGATIVE
Glucose, UA: NEGATIVE mg/dL
Ketones, ur: NEGATIVE mg/dL
Nitrite: NEGATIVE
Protein, ur: 100 mg/dL — AB
RBC / HPF: >50 RBC/hpf — ABNORMAL HIGH (ref 0–5)
Specific Gravity, Urine: 1.028 (ref 1.005–1.030)
pH: 5 (ref 5.0–8.0)

## 2019-04-14 LAB — URINE CULTURE

## 2019-04-16 ENCOUNTER — Ambulatory Visit
Admission: RE | Admit: 2019-04-16 | Discharge: 2019-04-16 | Disposition: A | Discharge status: Home / Self Care | Payer: HMO | Source: Ambulatory Visit | Attending: Radiation Oncology | Admitting: Radiation Oncology

## 2019-04-16 ENCOUNTER — Telehealth: Payer: Self-pay | Admitting: Oncology

## 2019-04-16 ENCOUNTER — Encounter: Payer: Self-pay | Admitting: Oncology

## 2019-04-16 ENCOUNTER — Other Ambulatory Visit: Payer: Self-pay

## 2019-04-16 DIAGNOSIS — C541 Malignant neoplasm of endometrium: Secondary | ICD-10-CM | POA: Diagnosis not present

## 2019-04-16 DIAGNOSIS — Z510 Encounter for antineoplastic radiation therapy: Secondary | ICD-10-CM | POA: Diagnosis not present

## 2019-04-16 NOTE — Telephone Encounter (Signed)
Left a message regarding urine culture results from 04/13/19.

## 2019-04-16 NOTE — Progress Notes (Signed)
Gynecologic Oncology Multi-Disciplinary Disposition Conference Note  Date of the Conference: 04/16/2019  Patient Name: [name]  Referring Provider: Dr. Benjie Karvonen Primary GYN Oncologist: Dr. [name]  Stage/Disposition:  Clinical stage 1 grade 3 endometrial cancer. Disposition is to primary radiation treatment with external beam and brachytherapy (Heyman's capsules).    This Multidisciplinary conference took place involving physicians from [place], [place], Radiation Oncology, Pathology, Radiology along with the Gynecologic Oncology Nurse Practitioner and RN.  Comprehensive assessment of the patient's malignancy, staging, need for surgery, chemotherapy, radiation therapy, and need for further testing were reviewed. Supportive measures, both inpatient and following discharge were also discussed. The recommended plan of care is documented. Greater than 35 minutes were spent correlating and coordinating this patient's care.

## 2019-04-17 ENCOUNTER — Other Ambulatory Visit: Payer: Self-pay

## 2019-04-17 ENCOUNTER — Ambulatory Visit
Admission: RE | Admit: 2019-04-17 | Discharge: 2019-04-17 | Disposition: A | Discharge status: Home / Self Care | Payer: HMO | Source: Ambulatory Visit | Attending: Radiation Oncology | Admitting: Radiation Oncology

## 2019-04-17 DIAGNOSIS — Z510 Encounter for antineoplastic radiation therapy: Secondary | ICD-10-CM | POA: Diagnosis not present

## 2019-04-17 DIAGNOSIS — C541 Malignant neoplasm of endometrium: Secondary | ICD-10-CM | POA: Diagnosis not present

## 2019-04-18 ENCOUNTER — Ambulatory Visit
Admission: RE | Admit: 2019-04-18 | Discharge: 2019-04-18 | Disposition: A | Discharge status: Home / Self Care | Payer: HMO | Source: Ambulatory Visit | Attending: Radiation Oncology | Admitting: Radiation Oncology

## 2019-04-18 ENCOUNTER — Other Ambulatory Visit (HOSPITAL_COMMUNITY): Payer: Self-pay | Admitting: Radiation Oncology

## 2019-04-18 ENCOUNTER — Other Ambulatory Visit: Payer: Self-pay | Admitting: Radiation Oncology

## 2019-04-18 ENCOUNTER — Other Ambulatory Visit: Payer: Self-pay

## 2019-04-18 DIAGNOSIS — C541 Malignant neoplasm of endometrium: Secondary | ICD-10-CM | POA: Diagnosis not present

## 2019-04-18 DIAGNOSIS — Z510 Encounter for antineoplastic radiation therapy: Secondary | ICD-10-CM | POA: Diagnosis not present

## 2019-04-19 ENCOUNTER — Telehealth: Payer: Self-pay | Admitting: Oncology

## 2019-04-19 ENCOUNTER — Ambulatory Visit: Payer: HMO

## 2019-04-19 NOTE — Telephone Encounter (Signed)
[name] called and wants to cancel treatments for today and tomorrow because of diarrhea, nausea and pain. She said Dr. Sondra Come mentioned on Tuesday that she could take a long weekend to help regain her strength so she would like to do that. Notified [facility] of cancellation.

## 2019-04-20 ENCOUNTER — Ambulatory Visit: Payer: HMO

## 2019-04-23 ENCOUNTER — Other Ambulatory Visit: Payer: Self-pay | Admitting: Radiation Oncology

## 2019-04-23 ENCOUNTER — Ambulatory Visit
Admission: RE | Admit: 2019-04-23 | Discharge: 2019-04-23 | Disposition: A | Discharge status: Home / Self Care | Payer: HMO | Source: Ambulatory Visit | Attending: Radiation Oncology | Admitting: Radiation Oncology

## 2019-04-23 ENCOUNTER — Other Ambulatory Visit: Payer: Self-pay

## 2019-04-23 DIAGNOSIS — Z510 Encounter for antineoplastic radiation therapy: Secondary | ICD-10-CM | POA: Diagnosis not present

## 2019-04-23 DIAGNOSIS — C541 Malignant neoplasm of endometrium: Secondary | ICD-10-CM | POA: Diagnosis not present

## 2019-04-23 DIAGNOSIS — R300 Dysuria: Secondary | ICD-10-CM | POA: Diagnosis not present

## 2019-04-23 LAB — URINALYSIS, COMPLETE (UACMP) WITH MICROSCOPIC
Bilirubin Urine: NEGATIVE
Glucose, UA: NEGATIVE mg/dL
Ketones, ur: NEGATIVE mg/dL
Nitrite: NEGATIVE
Protein, ur: 30 mg/dL — AB
Specific Gravity, Urine: 1.024 (ref 1.005–1.030)
WBC, UA: >50 WBC/hpf — ABNORMAL HIGH (ref 0–5)
pH: 5 (ref 5.0–8.0)

## 2019-04-23 LAB — CBC (CANCER CENTER ONLY)
HCT: 34.9 % — ABNORMAL LOW (ref 36.0–46.0)
Hemoglobin: 10.1 g/dL — ABNORMAL LOW (ref 12.0–15.0)
MCH: 25.6 pg — ABNORMAL LOW (ref 26.0–34.0)
MCHC: 28.9 g/dL — ABNORMAL LOW (ref 30.0–36.0)
MCV: 88.6 fL (ref 80.0–100.0)
Platelet Count: 224 10*3/uL (ref 150–400)
RBC: 3.94 MIL/uL (ref 3.87–5.11)
RDW: 17.7 % — ABNORMAL HIGH (ref 11.5–15.5)
WBC Count: 5.3 10*3/uL (ref 4.0–10.5)
nRBC: 0.6 % — ABNORMAL HIGH (ref 0.0–0.2)

## 2019-04-23 LAB — COMPREHENSIVE METABOLIC PANEL
ALT: <6 U/L (ref 0–44)
AST: 9 U/L — ABNORMAL LOW (ref 15–41)
Albumin: 3.1 g/dL — ABNORMAL LOW (ref 3.5–5.0)
Alkaline Phosphatase: 75 U/L (ref 38–126)
Anion gap: 9 (ref 5–15)
BUN: 12 mg/dL (ref 6–20)
CO2: 26 mmol/L (ref 22–32)
Calcium: 9 mg/dL (ref 8.9–10.3)
Chloride: 108 mmol/L (ref 98–111)
Creatinine, Ser: 0.75 mg/dL (ref 0.44–1.00)
GFR calc Af Amer: >60 mL/min (ref >60)
GFR calc non Af Amer: >60 mL/min (ref >60)
Glucose, Bld: 118 mg/dL — ABNORMAL HIGH (ref 70–99)
Potassium: 3.9 mmol/L (ref 3.5–5.1)
Sodium: 143 mmol/L (ref 135–145)
Total Bilirubin: 0.2 mg/dL — ABNORMAL LOW (ref 0.3–1.2)
Total Protein: 6.4 g/dL — ABNORMAL LOW (ref 6.5–8.1)

## 2019-04-23 MED ORDER — FUROSEMIDE 20 MG PO TABS: 20.0000 mg | ORAL_TABLET | Freq: Every day | ORAL | 0 refills | Status: DC

## 2019-04-24 ENCOUNTER — Other Ambulatory Visit: Payer: Self-pay

## 2019-04-24 ENCOUNTER — Ambulatory Visit
Admission: RE | Admit: 2019-04-24 | Discharge: 2019-04-24 | Disposition: A | Discharge status: Home / Self Care | Payer: HMO | Source: Ambulatory Visit | Attending: Radiation Oncology | Admitting: Radiation Oncology

## 2019-04-24 DIAGNOSIS — Z510 Encounter for antineoplastic radiation therapy: Secondary | ICD-10-CM | POA: Diagnosis not present

## 2019-04-24 DIAGNOSIS — C541 Malignant neoplasm of endometrium: Secondary | ICD-10-CM | POA: Diagnosis not present

## 2019-04-24 LAB — URINE CULTURE: Culture: <10000 — AB

## 2019-04-25 ENCOUNTER — Ambulatory Visit
Admission: RE | Admit: 2019-04-25 | Discharge: 2019-04-25 | Disposition: A | Discharge status: Home / Self Care | Payer: HMO | Source: Ambulatory Visit | Attending: Radiation Oncology | Admitting: Radiation Oncology

## 2019-04-25 ENCOUNTER — Other Ambulatory Visit: Payer: Self-pay | Admitting: Radiation Oncology

## 2019-04-25 ENCOUNTER — Other Ambulatory Visit: Payer: Self-pay

## 2019-04-25 DIAGNOSIS — C541 Malignant neoplasm of endometrium: Secondary | ICD-10-CM | POA: Diagnosis not present

## 2019-04-25 DIAGNOSIS — Z510 Encounter for antineoplastic radiation therapy: Secondary | ICD-10-CM | POA: Diagnosis not present

## 2019-04-25 MED ORDER — MORPHINE SULFATE ER 15 MG PO TBCR: 15.0000 mg | EXTENDED_RELEASE_TABLET | Freq: Two times a day (BID) | ORAL | 0 refills | Status: DC

## 2019-04-26 ENCOUNTER — Other Ambulatory Visit: Payer: Self-pay

## 2019-04-26 ENCOUNTER — Ambulatory Visit
Admission: RE | Admit: 2019-04-26 | Discharge: 2019-04-26 | Disposition: A | Discharge status: Home / Self Care | Payer: HMO | Source: Ambulatory Visit | Attending: Radiation Oncology | Admitting: Radiation Oncology

## 2019-04-26 DIAGNOSIS — C541 Malignant neoplasm of endometrium: Secondary | ICD-10-CM | POA: Diagnosis not present

## 2019-04-26 DIAGNOSIS — Z510 Encounter for antineoplastic radiation therapy: Secondary | ICD-10-CM | POA: Diagnosis not present

## 2019-04-27 ENCOUNTER — Other Ambulatory Visit: Payer: Self-pay

## 2019-04-27 ENCOUNTER — Ambulatory Visit
Admission: RE | Admit: 2019-04-27 | Discharge: 2019-04-27 | Disposition: A | Discharge status: Home / Self Care | Payer: HMO | Source: Ambulatory Visit | Attending: Radiation Oncology | Admitting: Radiation Oncology

## 2019-04-27 DIAGNOSIS — Z510 Encounter for antineoplastic radiation therapy: Secondary | ICD-10-CM | POA: Diagnosis not present

## 2019-04-27 DIAGNOSIS — C541 Malignant neoplasm of endometrium: Secondary | ICD-10-CM | POA: Diagnosis not present

## 2019-04-30 ENCOUNTER — Ambulatory Visit: Payer: HMO

## 2019-05-01 ENCOUNTER — Ambulatory Visit: Payer: HMO

## 2019-05-02 ENCOUNTER — Ambulatory Visit
Admission: RE | Admit: 2019-05-02 | Discharge: 2019-05-02 | Disposition: A | Discharge status: Home / Self Care | Payer: HMO | Source: Ambulatory Visit | Attending: Radiation Oncology | Admitting: Radiation Oncology

## 2019-05-02 ENCOUNTER — Other Ambulatory Visit: Payer: Self-pay

## 2019-05-02 DIAGNOSIS — C541 Malignant neoplasm of endometrium: Secondary | ICD-10-CM | POA: Diagnosis not present

## 2019-05-02 DIAGNOSIS — Z510 Encounter for antineoplastic radiation therapy: Secondary | ICD-10-CM | POA: Diagnosis not present

## 2019-05-03 ENCOUNTER — Ambulatory Visit: Payer: HMO

## 2019-05-03 ENCOUNTER — Other Ambulatory Visit: Payer: Self-pay

## 2019-05-03 MED ORDER — PROCHLORPERAZINE MALEATE 10 MG PO TABS: 10.0000 mg | ORAL_TABLET | Freq: Four times a day (QID) | ORAL | 1 refills | Status: DC | PRN

## 2019-05-04 ENCOUNTER — Ambulatory Visit: Payer: HMO

## 2019-05-07 ENCOUNTER — Other Ambulatory Visit: Payer: Self-pay

## 2019-05-07 ENCOUNTER — Ambulatory Visit
Admission: RE | Admit: 2019-05-07 | Discharge: 2019-05-07 | Disposition: A | Discharge status: Home / Self Care | Payer: HMO | Source: Ambulatory Visit | Attending: Radiation Oncology | Admitting: Radiation Oncology

## 2019-05-07 DIAGNOSIS — E1169 Type 2 diabetes mellitus with other specified complication: Secondary | ICD-10-CM | POA: Diagnosis not present

## 2019-05-07 DIAGNOSIS — Z23 Encounter for immunization: Secondary | ICD-10-CM | POA: Diagnosis not present

## 2019-05-07 DIAGNOSIS — E7849 Other hyperlipidemia: Secondary | ICD-10-CM | POA: Diagnosis not present

## 2019-05-07 DIAGNOSIS — Z510 Encounter for antineoplastic radiation therapy: Secondary | ICD-10-CM | POA: Diagnosis not present

## 2019-05-07 DIAGNOSIS — C541 Malignant neoplasm of endometrium: Secondary | ICD-10-CM | POA: Diagnosis not present

## 2019-05-07 DIAGNOSIS — I10 Essential (primary) hypertension: Secondary | ICD-10-CM | POA: Diagnosis not present

## 2019-05-08 ENCOUNTER — Ambulatory Visit
Admission: RE | Admit: 2019-05-08 | Discharge: 2019-05-08 | Disposition: A | Discharge status: Home / Self Care | Payer: HMO | Source: Ambulatory Visit | Attending: Radiation Oncology | Admitting: Radiation Oncology

## 2019-05-08 ENCOUNTER — Other Ambulatory Visit: Payer: Self-pay

## 2019-05-08 ENCOUNTER — Ambulatory Visit: Payer: HMO

## 2019-05-08 DIAGNOSIS — C541 Malignant neoplasm of endometrium: Secondary | ICD-10-CM | POA: Diagnosis not present

## 2019-05-08 DIAGNOSIS — Z510 Encounter for antineoplastic radiation therapy: Secondary | ICD-10-CM | POA: Diagnosis not present

## 2019-05-09 ENCOUNTER — Ambulatory Visit: Payer: HMO

## 2019-05-09 ENCOUNTER — Other Ambulatory Visit: Payer: Self-pay

## 2019-05-09 ENCOUNTER — Ambulatory Visit
Admission: RE | Admit: 2019-05-09 | Discharge: 2019-05-09 | Disposition: A | Discharge status: Home / Self Care | Payer: HMO | Source: Ambulatory Visit | Attending: Radiation Oncology | Admitting: Radiation Oncology

## 2019-05-09 DIAGNOSIS — Z510 Encounter for antineoplastic radiation therapy: Secondary | ICD-10-CM | POA: Diagnosis not present

## 2019-05-09 DIAGNOSIS — C541 Malignant neoplasm of endometrium: Secondary | ICD-10-CM | POA: Diagnosis not present

## 2019-05-10 ENCOUNTER — Ambulatory Visit: Payer: HMO

## 2019-05-10 ENCOUNTER — Ambulatory Visit
Admission: RE | Admit: 2019-05-10 | Discharge: 2019-05-10 | Disposition: A | Discharge status: Home / Self Care | Payer: HMO | Source: Ambulatory Visit | Attending: Radiation Oncology | Admitting: Radiation Oncology

## 2019-05-10 ENCOUNTER — Other Ambulatory Visit: Payer: Self-pay

## 2019-05-10 DIAGNOSIS — E162 Hypoglycemia, unspecified: Secondary | ICD-10-CM | POA: Diagnosis not present

## 2019-05-10 DIAGNOSIS — C541 Malignant neoplasm of endometrium: Secondary | ICD-10-CM | POA: Diagnosis not present

## 2019-05-10 DIAGNOSIS — F321 Major depressive disorder, single episode, moderate: Secondary | ICD-10-CM | POA: Diagnosis not present

## 2019-05-10 DIAGNOSIS — I10 Essential (primary) hypertension: Secondary | ICD-10-CM | POA: Diagnosis not present

## 2019-05-10 DIAGNOSIS — I700 Atherosclerosis of aorta: Secondary | ICD-10-CM | POA: Diagnosis not present

## 2019-05-10 DIAGNOSIS — Z510 Encounter for antineoplastic radiation therapy: Secondary | ICD-10-CM | POA: Diagnosis not present

## 2019-05-10 DIAGNOSIS — Z0000 Encounter for general adult medical examination without abnormal findings: Secondary | ICD-10-CM | POA: Diagnosis not present

## 2019-05-10 DIAGNOSIS — G894 Chronic pain syndrome: Secondary | ICD-10-CM | POA: Diagnosis not present

## 2019-05-10 DIAGNOSIS — M797 Fibromyalgia: Secondary | ICD-10-CM | POA: Diagnosis not present

## 2019-05-10 DIAGNOSIS — F319 Bipolar disorder, unspecified: Secondary | ICD-10-CM | POA: Diagnosis not present

## 2019-05-10 DIAGNOSIS — J449 Chronic obstructive pulmonary disease, unspecified: Secondary | ICD-10-CM | POA: Diagnosis not present

## 2019-05-10 DIAGNOSIS — Z1331 Encounter for screening for depression: Secondary | ICD-10-CM | POA: Diagnosis not present

## 2019-05-11 ENCOUNTER — Ambulatory Visit
Admission: RE | Admit: 2019-05-11 | Discharge: 2019-05-11 | Disposition: A | Discharge status: Home / Self Care | Payer: HMO | Source: Ambulatory Visit | Attending: Radiation Oncology | Admitting: Radiation Oncology

## 2019-05-11 ENCOUNTER — Other Ambulatory Visit: Payer: Self-pay

## 2019-05-11 DIAGNOSIS — Z510 Encounter for antineoplastic radiation therapy: Secondary | ICD-10-CM | POA: Diagnosis not present

## 2019-05-11 DIAGNOSIS — C541 Malignant neoplasm of endometrium: Secondary | ICD-10-CM | POA: Diagnosis not present

## 2019-05-14 ENCOUNTER — Other Ambulatory Visit (HOSPITAL_COMMUNITY): Payer: HMO

## 2019-05-14 ENCOUNTER — Ambulatory Visit
Admission: RE | Admit: 2019-05-14 | Discharge: 2019-05-14 | Disposition: A | Discharge status: Home / Self Care | Payer: HMO | Source: Ambulatory Visit | Attending: Radiation Oncology | Admitting: Radiation Oncology

## 2019-05-14 ENCOUNTER — Other Ambulatory Visit: Payer: Self-pay

## 2019-05-14 DIAGNOSIS — C541 Malignant neoplasm of endometrium: Secondary | ICD-10-CM | POA: Diagnosis not present

## 2019-05-14 DIAGNOSIS — Z510 Encounter for antineoplastic radiation therapy: Secondary | ICD-10-CM | POA: Diagnosis not present

## 2019-05-16 NOTE — Patient Instructions (Addendum)
DUE TO COVID-19 ONLY ONE VISITOR IS ALLOWED TO COME WITH YOU AND STAY IN THE WAITING ROOM ONLY DURING  PROCEDURE DAY OF SURGERY.  THE 1 VISITOR MAY VISIT WITH YOU AFTER SURGERY IN YOUR PRIVATE ROOM DURING VISITING HOURS ONLY!  YOU NEED TO HAVE A COVID 19 TEST ON__8/31_____ @_______ , THIS TEST MUST BE DONE BEFORE SURGERY, COME  [place] , [address].  ([place]) ONCE YOUR COVID TEST IS COMPLETED, PLEASE BEGIN THE QUARANTINE INSTRUCTIONS AS OUTLINED IN YOUR HANDOUT.                URVI BLOCKER   Your procedure is scheduled on: Thursday 05/24/19   Report to [facility] Main  Entrance   Report to Short Stay at 5:30 AM     Call this number if you have problems the morning of surgery [phone]    Remember: Do not eat food or drink liquids :After Midnight .  BRUSH YOUR TEETH MORNING OF SURGERY AND RINSE YOUR MOUTH OUT, NO CHEWING GUM CANDY OR MINTS.     Take these medicines the morning of surgery with A SIP OF WATER: MS Contin, Klonapin, Lamictal,Prozac,Prilosec, Minipress, Macrobid.   Please use your inhaler if needed and bring it to the hospital with you   DO NOT TAKE ANY DIABETIC MEDICATIONS DAY OF YOUR SURGERY     How to Manage Your Diabetes Before and After Surgery  Why is it important to control my blood sugar before and after surgery? . Improving blood sugar levels before and after surgery helps healing and can limit problems. . A way of improving blood sugar control is eating a healthy diet by: o  Eating less sugar and carbohydrates o  Increasing activity/exercise o  Talking with your doctor about reaching your blood sugar goals . High blood sugars (greater than 180 mg/dL) can raise your risk of infections and slow your recovery, so you will need to focus on controlling your diabetes during the weeks before surgery. . Make sure that the doctor who takes care of your diabetes knows about your planned surgery including the  date and location.  How do I manage my blood sugar before surgery? . Check your blood sugar at least 4 times a day, starting 2 days before surgery, to make sure that the level is not too high or low. o Check your blood sugar the morning of your surgery when you wake up and every 2 hours until you get to the Short Stay unit. . If your blood sugar is less than 70 mg/dL, you will need to treat for low blood sugar: o Do not take insulin. o Treat a low blood sugar (less than 70 mg/dL) with  cup of clear juice (cranberry or apple), 4 glucose tablets, OR glucose gel. o Recheck blood sugar in 15 minutes after treatment (to make sure it is greater than 70 mg/dL). If your blood sugar is not greater than 70 mg/dL on recheck, call [phone] for further instructions. . Report your blood sugar to the short stay nurse when you get to Short Stay.  . If you are admitted to the hospital after surgery: o Your blood sugar will be checked by the staff and you will probably be given insulin after surgery (instead of oral diabetes medicines) to make sure you have good blood sugar levels. o The goal for blood sugar control after surgery is 80-180 mg/dL.   WHAT DO I DO ABOUT MY DIABETES MEDICATION?  Marland Kitchen  Do not take oral diabetes medicines (pills) the morning of surgery.                               You may not have any metal on your body including hair pins and              piercings             Do not wear jewelry, make-up, lotions, powders or perfumes, deodorant             Do not wear nail polish.  Do not shave  48 hours prior to surgery.           Do not bring valuables to the hospital. [place].  Contacts, dentures or bridgework may not be worn into surgery.       Patients discharged the day of surgery will not be allowed to drive home.  IF YOU ARE HAVING SURGERY AND GOING HOME THE SAME DAY, YOU MUST HAVE AN ADULT TO DRIVE YOU HOME AND BE WITH YOU  FOR 24 HOURS.  YOU MAY GO HOME BY TAXI OR UBER OR ORTHERWISE, BUT AN ADULT MUST ACCOMPANY YOU HOME AND STAY WITH YOU FOR 24 HOURS.  Name and phone number of your driver:  Special Instructions: N/A              Please read over the following fact sheets you were given: _____________________________________________________________________             [facility] - Preparing for Surgery Before surgery, you can play an important role .  Because skin is not sterile, your skin needs to be as free of germs as possible .  You can reduce the number of germs on your skin by washing with CHG (chlorahexidine gluconate) soap before surgery.   CHG is an antiseptic cleaner which kills germs and bonds with the skin to continue killing germs even after washing. Please DO NOT use if you have an allergy to CHG or antibacterial soaps.   If your skin becomes reddened/irritated stop using the CHG and inform your nurse when you arrive at Short Stay. Do not shave (including legs and underarms) for at least 48 hours prior to the first CHG shower.  You may shave your face/neck. Please follow these instructions carefully:  1.  Shower with CHG Soap the night before surgery and the  morning of Surgery.  2.  If you choose to wash your hair, wash your hair first as usual with your  normal  shampoo.  3.  After you shampoo, rinse your hair and body thoroughly to remove the  shampoo.                                        4.  Use CHG as you would any other liquid soap.  You can apply chg directly  to the skin and wash                       Gently with a scrungie or clean washcloth.  5.  Apply the CHG Soap to your body ONLY FROM THE NECK DOWN.   Do not use on face/ open  Wound or open sores. Avoid contact with eyes, ears mouth and genitals (private parts).                       Wash face,  Genitals (private parts) with your normal soap.             6.  Wash thoroughly, paying special attention to  the area where your surgery  will be performed.  7.  Thoroughly rinse your body with warm water from the neck down.  8.  DO NOT shower/wash with your normal soap after using and rinsing off  the CHG Soap.             9.  Pat yourself dry with a clean towel.            10.  Wear clean pajamas.            11.  Place clean sheets on your bed the night of your first shower and do not  sleep with pets. Day of Surgery : Do not apply any lotions/deodorants the morning of surgery.  Please wear clean clothes to the hospital/surgery center.  FAILURE TO FOLLOW THESE INSTRUCTIONS MAY RESULT IN THE CANCELLATION OF YOUR SURGERY PATIENT SIGNATURE_________________________________  NURSE SIGNATURE__________________________________  ________________________________________________________________________

## 2019-05-17 ENCOUNTER — Ambulatory Visit: Admit: 2019-05-17 | Payer: HMO | Admitting: Radiation Oncology

## 2019-05-17 ENCOUNTER — Ambulatory Visit: Payer: HMO | Admitting: Radiation Oncology

## 2019-05-17 ENCOUNTER — Other Ambulatory Visit (HOSPITAL_COMMUNITY): Payer: HMO

## 2019-05-17 SURGERY — INSERTION, UTERINE TANDEM AND RING OR CYLINDER, FOR BRACHYTHERAPY
Anesthesia: General

## 2019-05-18 ENCOUNTER — Encounter (INDEPENDENT_AMBULATORY_CARE_PROVIDER_SITE_OTHER): Payer: Self-pay

## 2019-05-18 ENCOUNTER — Other Ambulatory Visit (HOSPITAL_COMMUNITY): Payer: HMO

## 2019-05-18 ENCOUNTER — Encounter (HOSPITAL_COMMUNITY): Payer: Self-pay

## 2019-05-18 ENCOUNTER — Other Ambulatory Visit: Payer: Self-pay

## 2019-05-18 ENCOUNTER — Encounter (HOSPITAL_COMMUNITY)
Admission: RE | Admit: 2019-05-18 | Discharge: 2019-05-18 | Disposition: A | Discharge status: Home / Self Care | Payer: HMO | Source: Ambulatory Visit | Attending: Radiation Oncology | Admitting: Radiation Oncology

## 2019-05-18 DIAGNOSIS — Z20828 Contact with and (suspected) exposure to other viral communicable diseases: Secondary | ICD-10-CM | POA: Insufficient documentation

## 2019-05-18 DIAGNOSIS — K219 Gastro-esophageal reflux disease without esophagitis: Secondary | ICD-10-CM | POA: Insufficient documentation

## 2019-05-18 DIAGNOSIS — Z01812 Encounter for preprocedural laboratory examination: Secondary | ICD-10-CM | POA: Insufficient documentation

## 2019-05-18 DIAGNOSIS — J449 Chronic obstructive pulmonary disease, unspecified: Secondary | ICD-10-CM | POA: Insufficient documentation

## 2019-05-18 DIAGNOSIS — Z87891 Personal history of nicotine dependence: Secondary | ICD-10-CM | POA: Insufficient documentation

## 2019-05-18 DIAGNOSIS — Z7984 Long term (current) use of oral hypoglycemic drugs: Secondary | ICD-10-CM | POA: Insufficient documentation

## 2019-05-18 DIAGNOSIS — M797 Fibromyalgia: Secondary | ICD-10-CM | POA: Insufficient documentation

## 2019-05-18 DIAGNOSIS — F419 Anxiety disorder, unspecified: Secondary | ICD-10-CM | POA: Insufficient documentation

## 2019-05-18 DIAGNOSIS — Z79899 Other long term (current) drug therapy: Secondary | ICD-10-CM | POA: Diagnosis not present

## 2019-05-18 DIAGNOSIS — I10 Essential (primary) hypertension: Secondary | ICD-10-CM | POA: Diagnosis not present

## 2019-05-18 DIAGNOSIS — E119 Type 2 diabetes mellitus without complications: Secondary | ICD-10-CM | POA: Insufficient documentation

## 2019-05-18 DIAGNOSIS — C541 Malignant neoplasm of endometrium: Secondary | ICD-10-CM | POA: Insufficient documentation

## 2019-05-18 HISTORY — DX: Essential (primary) hypertension: I10

## 2019-05-18 LAB — CBC WITH DIFFERENTIAL/PLATELET
Abs Immature Granulocytes: 0.03 10*3/uL (ref 0.00–0.07)
Basophils Absolute: 0 10*3/uL (ref 0.0–0.1)
Basophils Relative: 0 %
Eosinophils Absolute: 0.3 10*3/uL (ref 0.0–0.5)
Eosinophils Relative: 4 %
HCT: 42.1 % (ref 36.0–46.0)
Hemoglobin: 12.3 g/dL (ref 12.0–15.0)
Immature Granulocytes: 0 %
Lymphocytes Relative: 5 %
Lymphs Abs: 0.4 10*3/uL — ABNORMAL LOW (ref 0.7–4.0)
MCH: 25.7 pg — ABNORMAL LOW (ref 26.0–34.0)
MCHC: 29.2 g/dL — ABNORMAL LOW (ref 30.0–36.0)
MCV: 87.9 fL (ref 80.0–100.0)
Monocytes Absolute: 0.6 10*3/uL (ref 0.1–1.0)
Monocytes Relative: 8 %
Neutro Abs: 6.3 10*3/uL (ref 1.7–7.7)
Neutrophils Relative %: 83 %
Platelets: 234 10*3/uL (ref 150–400)
RBC: 4.79 MIL/uL (ref 3.87–5.11)
RDW: 19.9 % — ABNORMAL HIGH (ref 11.5–15.5)
WBC: 7.7 10*3/uL (ref 4.0–10.5)
nRBC: 0 % (ref 0.0–0.2)

## 2019-05-18 LAB — BASIC METABOLIC PANEL
Anion gap: 9 (ref 5–15)
BUN: 22 mg/dL — ABNORMAL HIGH (ref 6–20)
CO2: 25 mmol/L (ref 22–32)
Calcium: 9.4 mg/dL (ref 8.9–10.3)
Chloride: 105 mmol/L (ref 98–111)
Creatinine, Ser: 1.05 mg/dL — ABNORMAL HIGH (ref 0.44–1.00)
GFR calc Af Amer: >60 mL/min (ref >60)
GFR calc non Af Amer: 58 mL/min — ABNORMAL LOW (ref >60)
Glucose, Bld: 126 mg/dL — ABNORMAL HIGH (ref 70–99)
Potassium: 4.6 mmol/L (ref 3.5–5.1)
Sodium: 139 mmol/L (ref 135–145)

## 2019-05-18 LAB — HEMOGLOBIN A1C
Hgb A1c MFr Bld: 6.8 % — ABNORMAL HIGH (ref 4.8–5.6)
Mean Plasma Glucose: 148.46 mg/dL

## 2019-05-18 LAB — GLUCOSE, CAPILLARY: Glucose-Capillary: 131 mg/dL — ABNORMAL HIGH (ref 70–99)

## 2019-05-18 NOTE — Progress Notes (Signed)
PCP - Dr. Dione Housekeeper Cardiologist - none  Chest x-ray - 11/10/18 EKG - 12/12/18 Stress Test - no ECHO - no Cardiac Cath - no  Sleep Study - no CPAP - no  Fasting Blood Sugar - 130 Checks Blood Sugar __2___ times a day  Blood Thinner Instructions:NA Aspirin Instructions: Last Dose:  Anesthesia review:   Patient denies shortness of breath, fever, cough and chest pain at PAT appointment  yes   Patient verbalized understanding of instructions that were given to them at the PAT appointment. Patient was also instructed that they will need to review over the PAT instructions again at home before surgery.  tes

## 2019-05-21 ENCOUNTER — Other Ambulatory Visit (HOSPITAL_COMMUNITY)
Admission: RE | Admit: 2019-05-21 | Discharge: 2019-05-21 | Disposition: A | Discharge status: Home / Self Care | Payer: HMO | Source: Ambulatory Visit | Attending: Radiation Oncology | Admitting: Radiation Oncology

## 2019-05-21 DIAGNOSIS — Z01812 Encounter for preprocedural laboratory examination: Secondary | ICD-10-CM | POA: Diagnosis not present

## 2019-05-21 LAB — SARS CORONAVIRUS 2 (TAT 6-24 HRS): SARS Coronavirus 2: NEGATIVE

## 2019-05-21 NOTE — Anesthesia Preprocedure Evaluation (Addendum)
Anesthesia Evaluation  Patient identified by MRN, date of birth, ID band Patient awake    Reviewed: Allergy & Precautions, NPO status , Patient's Chart, lab work & pertinent test results  Airway Mallampati: II  TM Distance: >3 FB Neck ROM: Full    Dental  (+) Poor Dentition, Loose, Missing,    Pulmonary asthma , pneumonia, COPD, former smoker,    Pulmonary exam normal breath sounds clear to auscultation       Cardiovascular hypertension, Pt. on medications Normal cardiovascular exam Rhythm:Regular Rate:Normal  LHC 02/2019  The left ventricular ejection fraction is hyperdynamic (>65%).  Nuclear stress EF: 68%.  There was no ST segment deviation noted during stress. Anterior wall mild perfusion defect seen at both rest and stress. Breast attenuation artifact.  This is a low risk study. No ischemia identified.     Neuro/Psych  Headaches, PSYCHIATRIC DISORDERS Anxiety Depression  Neuromuscular disease    GI/Hepatic Neg liver ROS, GERD  ,  Endo/Other  diabetes  Renal/GU Renal disease     Musculoskeletal  (+) Fibromyalgia -  Abdominal (+) + obese,   Peds  Hematology negative hematology ROS (+)   Anesthesia Other Findings   Reproductive/Obstetrics negative OB ROS                                                           Anesthesia Evaluation  Patient identified by MRN, date of birth, ID band Patient awake    Reviewed: Allergy & Precautions, H&P , NPO status , Patient's Chart, lab work & pertinent test results, reviewed documented beta blocker date and time   Airway Mallampati: II  Neck ROM: full    Dental  (+) Poor Dentition, Loose, Missing and Dental Advisory Given   Pulmonary asthma , COPDformer smoker,          Cardiovascular     Neuro/Psych  Headaches, Anxiety  Neuromuscular disease    GI/Hepatic GERD-  ,  Endo/Other  diabetes, Type 2Morbid obesity  Renal/GU       Musculoskeletal  (+) Fibromyalgia -  Abdominal   Peds  Hematology   Anesthesia Other Findings   Reproductive/Obstetrics                         Anesthesia Physical Anesthesia Plan  ASA: III  Anesthesia Plan: General   Post-op Pain Management:    Induction: Intravenous  Airway Management Planned: Oral ETT  Additional Equipment:   Intra-op Plan:   Post-operative Plan: Extubation in OR  Informed Consent: I have reviewed the patients History and Physical, chart, labs and discussed the procedure including the risks, benefits and alternatives for the proposed anesthesia with the patient or authorized representative who has indicated his/her understanding and acceptance.     Plan Discussed with: CRNA, Anesthesiologist and Surgeon  Anesthesia Plan Comments:         Anesthesia Quick Evaluation                                   Anesthesia Evaluation  Patient identified by MRN, date of birth, ID band Patient awake    Reviewed: Allergy & Precautions, H&P , NPO status , Patient's Chart, lab work & pertinent test results, reviewed  documented beta blocker date and time   Airway Mallampati: II  Neck ROM: full    Dental  (+) Poor Dentition, Loose, Missing and Dental Advisory Given   Pulmonary asthma , COPDformer smoker,          Cardiovascular     Neuro/Psych  Headaches, Anxiety  Neuromuscular disease    GI/Hepatic GERD-  ,  Endo/Other  diabetes, Type 2Morbid obesity  Renal/GU      Musculoskeletal  (+) Fibromyalgia -  Abdominal   Peds  Hematology   Anesthesia Other Findings   Reproductive/Obstetrics                         Anesthesia Physical Anesthesia Plan  ASA: III  Anesthesia Plan: General   Post-op Pain Management:    Induction: Intravenous  Airway Management Planned: Oral ETT  Additional Equipment:   Intra-op Plan:   Post-operative Plan: Extubation in OR  Informed  Consent: I have reviewed the patients History and Physical, chart, labs and discussed the procedure including the risks, benefits and alternatives for the proposed anesthesia with the patient or authorized representative who has indicated his/her understanding and acceptance.     Plan Discussed with: CRNA, Anesthesiologist and Surgeon  Anesthesia Plan Comments:         Anesthesia Quick Evaluation  Anesthesia Physical Anesthesia Plan  ASA: III  Anesthesia Plan: General   Post-op Pain Management:    Induction: Intravenous  PONV Risk Score and Plan: 4 or greater and Ondansetron, Dexamethasone and Treatment may vary due to age or medical condition  Airway Management Planned: LMA  Additional Equipment:   Intra-op Plan:   Post-operative Plan: Extubation in OR  Informed Consent: I have reviewed the patients History and Physical, chart, labs and discussed the procedure including the risks, benefits and alternatives for the proposed anesthesia with the patient or authorized representative who has indicated his/her understanding and acceptance.     Dental advisory given  Plan Discussed with: CRNA  Anesthesia Plan Comments: (Per cardiologist, Dr. Quay Burow, on 03/06/19, "Her Myoview stress test was normal.  Cleared for her open OB/GYN surgery at low risk"  Myoview 02/27/2019  The left ventricular ejection fraction is hyperdynamic (>65%).  Nuclear stress EF: 68%.  There was no ST segment deviation noted during stress. Anterior wall mild perfusion defect seen at both rest and stress. Breast attenuation artifact.  This is a low risk study. No ischemia identified.)      Anesthesia Quick Evaluation

## 2019-05-22 ENCOUNTER — Other Ambulatory Visit (HOSPITAL_COMMUNITY): Payer: Self-pay | Admitting: Radiation Oncology

## 2019-05-22 DIAGNOSIS — C541 Malignant neoplasm of endometrium: Secondary | ICD-10-CM

## 2019-05-22 NOTE — H&P (Signed)
Radiation Oncology         (336) [phone] ________________________________  History and physical examination  Name: [name] MRN: [id number]  Date: 05/10/2019  DOB: [birth year]   DIAGNOSIS: Clinical stage I,  grade 3 endometrial cancer (inoperable)  HISTORY OF PRESENT ILLNESS::[name] is a 58 y.o. female who is  presented with abdominal pain and intermittent vaginal spotting for approximately 2 years. She initially contributed the abdominal pain to her surgery for hernias and reversal of a Hartman's pouch. Since the cramping pain persisted, she underwent abdomen/pelvis CT on 09/22/2018 which showed uterine fibroids with prominence of the endometrium.   Her gyneocologist performed an endometrial Pipelle biopsy on 10/08/2018. Pathology revealed FIGO grade 3 endometrioid adenocarcinoma; P53 negative stains, ER and PR negative.  She initially opted to undergo surgery, but she developed pneumonia during the interim, which left her feeling too weak to undergo surgery.  Given the patient's poor performance status Comorbidities and Recent Pneumonia she would be a poor risk for surgery and the patient [name] felt that definitive radiation therapy would be a better option for the patient.  Patient has recently completed 45 Gy of external beam radiation therapy directed to the pelvis area.  She did experience fatigue as well as nausea and diarrhea with her radiation treatments but was able to complete the external beam portion successfully.  Patient now presents to the operating room for her first brachii therapy procedure.  She will have placement of Heyman capsules within the endometrial cavity which will then be used to deliver high-dose-rate radiation therapy with iridium 192.  PREVIOUS RADIATION THERAPY: No  PAST MEDICAL HISTORY:  has a past medical history of Acid reflux disease, Anxiety, Asthma, Bronchitis, chronic (HCC), COPD (chronic obstructive pulmonary disease) ([place]), Depression,  Diabetes mellitus without complication ([place]), Diverticulitis, Dysplasia of cervix (uteri), Fibromyalgia, Fibromyalgia, GERD (gastroesophageal reflux disease), Headache(784.0), Hypertension, Kidney stones, Pneumonia (2019), Restless leg syndrome, and Vomiting.    PAST SURGICAL HISTORY: Past Surgical History:  Procedure Laterality Date  . ABDOMINAL HYSTERECTOMY    . APPENDECTOMY    . APPLICATION OF WOUND VAC N/A 11/01/2012   Procedure: APPLICATION OF WOUND VAC;  Surgeon: Joyice Faster. Cornett, MD;  Location: [facility];  Service: General;  Laterality: N/A;  . CHOLECYSTECTOMY    . COLOSTOMY    . COLOSTOMY CLOSURE N/A 11/01/2012   Procedure: COLOSTOMY CLOSURE;  Surgeon: Joyice Faster. Cornett, MD;  Location: [facility];  Service: General;  Laterality: N/A;  REPAIR PARASTOMAL HERNIA; REVISION OF COLOSTOMY  . COLOSTOMY CLOSURE N/A 05/03/2013   Procedure: COLOSTOMY CLOSURE;  Surgeon: Joyice Faster. Cornett, MD;  Location: [place];  Service: General;  Laterality: N/A;  . ESOPHAGOGASTRODUODENOSCOPY Left 11/27/2012   Procedure: ESOPHAGOGASTRODUODENOSCOPY (EGD);  Surgeon: Winfield Cunas., MD;  Location: [facility];  Service: Gastroenterology;  Laterality: Left;  . INCISIONAL HERNIA REPAIR N/A 11/01/2012   Procedure: HERNIA REPAIR INCISIONAL;  Surgeon: Joyice Faster. Cornett, MD;  [facility]: [facility];  Service: General;  Laterality: N/A;  . LAPAROTOMY  11/01/2012   Procedure: EXPLORATORY LAPAROTOMY;  Surgeon: Joyice Faster. Cornett, MD;  Location: [facility];  Service: General;;  exploratory laparotomy,repair of parastomal hernia and incisional hernia, lysis of adhesions, and application of wound V.A.C.  . LYSIS OF ADHESION N/A 11/01/2012   Procedure: LYSIS OF ADHESION;  Surgeon: Joyice Faster. Cornett, MD;  [facility];  Service: General;  Laterality: N/A;  . PARASTOMAL HERNIA REPAIR N/A 11/01/2012   Procedure: HERNIA REPAIR PARASTOMAL;  Surgeon: Joyice Faster. Cornett,  MD;  [facility]: [facility];  Service: General;  Laterality: N/A;  . PARTIAL  HYSTERECTOMY     partial    FAMILY HISTORY: family history includes Diabetes in her mother; Throat cancer in her mother; Uterine cancer in her sister.  SOCIAL HISTORY:  reports that she quit smoking about 10 years ago. Her smoking use included cigarettes. She smoked 0.50 packs per day. She has never used smokeless tobacco. She reports that she does not drink alcohol or use drugs.  ALLERGIES: Tramadol  MEDICATIONS:  No current facility-administered medications for this encounter.    Current Outpatient Medications  Medication Sig Dispense Refill  . atorvastatin (LIPITOR) 40 MG tablet Take 40 mg by mouth at bedtime.     . clonazePAM (KLONOPIN) 1 MG tablet Take 1 tab twice daily (Patient taking differently: Take 1 mg by mouth 2 (two) times daily. ) 60 tablet 2  . dicyclomine (BENTYL) 10 MG capsule Take 10 mg by mouth every 8 (eight) hours as needed for spasms (abdominal spasms).     Marland Kitchen FLUoxetine (PROZAC) 40 MG capsule Take 1 capsule (40 mg total) by mouth daily. 90 capsule 0  . lamoTRIgine (LAMICTAL) 150 MG tablet Take 1 tablet (150 mg total) by mouth 2 (two) times daily. 180 tablet 0  . lisinopril (PRINIVIL,ZESTRIL) 20 MG tablet Take 20 mg by mouth daily.   3  . morphine (MS CONTIN) 15 MG 12 hr tablet Take 1 tablet (15 mg total) by mouth every 12 (twelve) hours. 60 tablet 0  . nitrofurantoin, macrocrystal-monohydrate, (MACROBID) 100 MG capsule Take 100 mg by mouth 2 (two) times daily.    Marland Kitchen omeprazole (PRILOSEC) 40 MG capsule Take 40 mg by mouth daily before breakfast.   6  . prazosin (MINIPRESS) 5 MG capsule Take 1 capsule (5 mg total) by mouth at bedtime. 90 capsule 0  . prochlorperazine (COMPAZINE) 10 MG tablet Take 1 tablet (10 mg total) by mouth every 6 (six) hours as needed for nausea or vomiting. 30 tablet 1  . trazodone (DESYREL) 300 MG tablet Take 1 tablet (300 mg total) by mouth at bedtime. 90 tablet 0  . VENTOLIN HFA 108 (90 BASE) MCG/ACT inhaler Inhale 2 puffs into the lungs every  6 (six) hours as needed for wheezing or shortness of breath.     . furosemide (LASIX) 20 MG tablet Take 1 tablet (20 mg total) by mouth daily. (Patient not taking: Reported on 05/16/2019) 3 tablet 0  . metFORMIN (GLUCOPHAGE) 1000 MG tablet Take 1,000 mg by mouth 2 (two) times daily with a meal.     . oxyCODONE (OXYCONTIN) 10 mg 12 hr tablet Take 1 tablet (10 mg total) by mouth every 12 (twelve) hours. (Patient not taking: Reported on 05/16/2019) 10 tablet 0  . traMADol (ULTRAM) 50 MG tablet Take 100 mg by mouth as needed. Patient states she takes two pills every 8 hours as needed for pain.  2    REVIEW OF SYSTEMS:  A 10+ POINT REVIEW OF SYSTEMS WAS OBTAINED including neurology, dermatology, psychiatry, cardiac, respiratory, lymph, extremities, GI, GU, musculoskeletal, constitutional, reproductive, HEENT. She denies hematuria, rectal bleeding, and abdominal bloating. She reports dysuria, scant brown vaginal discharge, uterine/vaginal pain rated a 10/10. She describes the dysuria as "on the inside, not the outside," and the discharge as "slimy and sticky." She reports diarrhea at baseline. She describes her pain as cramping, sharp, and constant, and is worst with vaginal bleeding. She states the pain began approximately 2 months ago and causes  constant nausea and vomiting 1-2 times per week.    PHYSICAL EXAM: General: Alert and oriented, in no acute distress HEENT: Head is normocephalic. Extraocular movements are intact. Oropharynx is clear. Neck: Neck is supple, no palpable cervical or supraclavicular lymphadenopathy. Heart: Regular in rate and rhythm with no murmurs, rubs, or gallops. Chest: Clear to auscultation bilaterally, with no rhonchi, wheezes, or rales. Abdomen: Soft, nontender, nondistended, with no rigidity or guarding. Extremities: No cyanosis or edema. Lymphatics: see Neck Exam Skin: No concerning lesions. Musculoskeletal: symmetric strength and muscle tone throughout. Neurologic:  Cranial nerves II through XII are grossly intact. No obvious focalities. Speech is fluent. Coordination is intact. Psychiatric: Judgment and insight are intact. Affect is appropriate. Pelvic exam to be performed in the operating room with general anesthesia  ECOG = 2-3   LABORATORY DATA:  Lab Results  Component Value Date   WBC 7.7 05/18/2019   HGB 12.3 05/18/2019   HCT 42.1 05/18/2019   MCV 87.9 05/18/2019   PLT 234 05/18/2019   NEUTROABS 6.3 05/18/2019   Lab Results  Component Value Date   NA 139 05/18/2019   K 4.6 05/18/2019   CL 105 05/18/2019   CO2 25 05/18/2019   GLUCOSE 126 (H) 05/18/2019   CREATININE 1.05 (H) 05/18/2019   CALCIUM 9.4 05/18/2019      RADIOGRAPHY: No results found.    IMPRESSION: Clinical stage I,  grade 3 endometrial cancer (inoperable). Given the patients Performance status and comorbidities she is felt to be at high risk for surgery and therefore considered inoperable.  Patient has completed her external beam treatments and is now ready to proceed with brachii therapy as part of her definitive course of radiation therapy inoperable endometrial cancer.  PLAN: Patient will be taken to the operating room on September 3 for exam under anesthesia and placement of Heyman capsules within the endometrial cavity in preparation for high-dose-rate radiation therapy with iridium 192 as the high-dose-rate source.  Dr. [name] will be present for the procedure who will dilate the cervical os and place  Heyman capsules.  Patient is scheduled to receive 2 brachii therapy treatments to complete her definitive course of radiation therapy.    ------------------------------------------------  Blair Promise, PhD, MD  This document serves as a record of services personally performed by Gery Pray, MD. It was created on his behalf by Wilburn Mylar, a trained medical scribe. The creation of this record is based on the scribe's personal observations and the provider's  statements to them. This document has been checked and approved by the attending provider.

## 2019-05-22 NOTE — H&P (View-Only) (Signed)
Radiation Oncology         [phone] ________________________________  History and physical examination  Name: [name] MRN: [id number]  Date: 05/10/2019  DOB: [birth year]   DIAGNOSIS: Clinical stage I,  grade 3 endometrial cancer (inoperable)  HISTORY OF PRESENT ILLNESS::[name] is a 58 y.o. female who is  presented with abdominal pain and intermittent vaginal spotting for approximately 2 years. She initially contributed the abdominal pain to her surgery for hernias and reversal of a Hartman's pouch. Since the cramping pain persisted, she underwent abdomen/pelvis CT on 09/22/2018 which showed uterine fibroids with prominence of the endometrium.   Her gyneocologist performed an endometrial Pipelle biopsy on 10/08/2018. Pathology revealed FIGO grade 3 endometrioid adenocarcinoma; P53 negative stains, ER and PR negative.  She initially opted to undergo surgery, but she developed pneumonia during the interim, which left her feeling too weak to undergo surgery.  Given the patient's poor performance status Comorbidities and Recent Pneumonia she would be a poor risk for surgery and the patient [name] felt that definitive radiation therapy would be a better option for the patient.  Patient has recently completed 45 Gy of external beam radiation therapy directed to the pelvis area.  She did experience fatigue as well as nausea and diarrhea with her radiation treatments but was able to complete the external beam portion successfully.  Patient now presents to the operating room for her first brachii therapy procedure.  She will have placement of Heyman capsules within the endometrial cavity which will then be used to deliver high-dose-rate radiation therapy with iridium 192.  PREVIOUS RADIATION THERAPY: No  PAST MEDICAL HISTORY:  has a past medical history of Acid reflux disease, Anxiety, Asthma, Bronchitis, chronic (HCC), COPD (chronic obstructive pulmonary disease) ([place]), Depression,  Diabetes mellitus without complication ([place]), Diverticulitis, Dysplasia of cervix (uteri), Fibromyalgia, Fibromyalgia, GERD (gastroesophageal reflux disease), Headache(784.0), Hypertension, Kidney stones, Pneumonia (2019), Restless leg syndrome, and Vomiting.    PAST SURGICAL HISTORY: Past Surgical History:  Procedure Laterality Date  . ABDOMINAL HYSTERECTOMY    . APPENDECTOMY    . APPLICATION OF WOUND VAC N/A 11/01/2012   Procedure: APPLICATION OF WOUND VAC;  Surgeon: Joyice Faster. Cornett, MD;  Location: [facility];  Service: General;  Laterality: N/A;  . CHOLECYSTECTOMY    . COLOSTOMY    . COLOSTOMY CLOSURE N/A 11/01/2012   Procedure: COLOSTOMY CLOSURE;  Surgeon: Joyice Faster. Cornett, MD;  Location[facility];  Service: General;  Laterality: N/A;  REPAIR PARASTOMAL HERNIA; REVISION OF COLOSTOMY  . COLOSTOMY CLOSURE N/A 05/03/2013   Procedure: COLOSTOMY CLOSURE;  Surgeon: Joyice Faster. Cornett, MD;  Location: [facility];  Service: General;  Laterality: N/A;  . ESOPHAGOGASTRODUODENOSCOPY Left 11/27/2012   Procedure: ESOPHAGOGASTRODUODENOSCOPY (EGD);  Surgeon: Winfield Cunas., MD;  Location: [facility];  Service: Gastroenterology;  Laterality: Left;  . INCISIONAL HERNIA REPAIR N/A 11/01/2012   Procedure: HERNIA REPAIR INCISIONAL;  Surgeon: Joyice Faster. Cornett, MD;  [facility];  Service: General;  Laterality: N/A;  . LAPAROTOMY  11/01/2012   Procedure: EXPLORATORY LAPAROTOMY;  Surgeon: Joyice Faster. Cornett, MD;  Location: [facility];  Service: General;;  exploratory laparotomy,repair of parastomal hernia and incisional hernia, lysis of adhesions, and application of wound V.A.C.  . LYSIS OF ADHESION N/A 11/01/2012   Procedure: LYSIS OF ADHESION;  Surgeon: Joyice Faster. Cornett, MD;  Location[facility];  Service: General;  Laterality: N/A;  . PARASTOMAL HERNIA REPAIR N/A 11/01/2012   Procedure: HERNIA REPAIR PARASTOMAL;  Surgeon: Joyice Faster. Cornett,  MD;  Location: [facility];  Service: General;  Laterality: N/A;  . PARTIAL  HYSTERECTOMY     partial    FAMILY HISTORY: family history includes Diabetes in her mother; Throat cancer in her mother; Uterine cancer in her sister.  SOCIAL HISTORY:  reports that she quit smoking about 10 years ago. Her smoking use included cigarettes. She smoked 0.50 packs per day. She has never used smokeless tobacco. She reports that she does not drink alcohol or use drugs.  ALLERGIES: Tramadol  MEDICATIONS:  No current facility-administered medications for this encounter.    Current Outpatient Medications  Medication Sig Dispense Refill  . atorvastatin (LIPITOR) 40 MG tablet Take 40 mg by mouth at bedtime.     . clonazePAM (KLONOPIN) 1 MG tablet Take 1 tab twice daily (Patient taking differently: Take 1 mg by mouth 2 (two) times daily. ) 60 tablet 2  . dicyclomine (BENTYL) 10 MG capsule Take 10 mg by mouth every 8 (eight) hours as needed for spasms (abdominal spasms).     Marland Kitchen FLUoxetine (PROZAC) 40 MG capsule Take 1 capsule (40 mg total) by mouth daily. 90 capsule 0  . lamoTRIgine (LAMICTAL) 150 MG tablet Take 1 tablet (150 mg total) by mouth 2 (two) times daily. 180 tablet 0  . lisinopril (PRINIVIL,ZESTRIL) 20 MG tablet Take 20 mg by mouth daily.   3  . morphine (MS CONTIN) 15 MG 12 hr tablet Take 1 tablet (15 mg total) by mouth every 12 (twelve) hours. 60 tablet 0  . nitrofurantoin, macrocrystal-monohydrate, (MACROBID) 100 MG capsule Take 100 mg by mouth 2 (two) times daily.    Marland Kitchen omeprazole (PRILOSEC) 40 MG capsule Take 40 mg by mouth daily before breakfast.   6  . prazosin (MINIPRESS) 5 MG capsule Take 1 capsule (5 mg total) by mouth at bedtime. 90 capsule 0  . prochlorperazine (COMPAZINE) 10 MG tablet Take 1 tablet (10 mg total) by mouth every 6 (six) hours as needed for nausea or vomiting. 30 tablet 1  . trazodone (DESYREL) 300 MG tablet Take 1 tablet (300 mg total) by mouth at bedtime. 90 tablet 0  . VENTOLIN HFA 108 (90 BASE) MCG/ACT inhaler Inhale 2 puffs into the lungs every  6 (six) hours as needed for wheezing or shortness of breath.     . furosemide (LASIX) 20 MG tablet Take 1 tablet (20 mg total) by mouth daily. (Patient not taking: Reported on 05/16/2019) 3 tablet 0  . metFORMIN (GLUCOPHAGE) 1000 MG tablet Take 1,000 mg by mouth 2 (two) times daily with a meal.     . oxyCODONE (OXYCONTIN) 10 mg 12 hr tablet Take 1 tablet (10 mg total) by mouth every 12 (twelve) hours. (Patient not taking: Reported on 05/16/2019) 10 tablet 0  . traMADol (ULTRAM) 50 MG tablet Take 100 mg by mouth as needed. Patient states she takes two pills every 8 hours as needed for pain.  2    REVIEW OF SYSTEMS:  A 10+ POINT REVIEW OF SYSTEMS WAS OBTAINED including neurology, dermatology, psychiatry, cardiac, respiratory, lymph, extremities, GI, GU, musculoskeletal, constitutional, reproductive, HEENT. She denies hematuria, rectal bleeding, and abdominal bloating. She reports dysuria, scant brown vaginal discharge, uterine/vaginal pain rated a 10/10. She describes the dysuria as "on the inside, not the outside," and the discharge as "slimy and sticky." She reports diarrhea at baseline. She describes her pain as cramping, sharp, and constant, and is worst with vaginal bleeding. She states the pain began approximately 2 months ago and causes  constant nausea and vomiting 1-2 times per week.    PHYSICAL EXAM: General: Alert and oriented, in no acute distress HEENT: Head is normocephalic. Extraocular movements are intact. Oropharynx is clear. Neck: Neck is supple, no palpable cervical or supraclavicular lymphadenopathy. Heart: Regular in rate and rhythm with no murmurs, rubs, or gallops. Chest: Clear to auscultation bilaterally, with no rhonchi, wheezes, or rales. Abdomen: Soft, nontender, nondistended, with no rigidity or guarding. Extremities: No cyanosis or edema. Lymphatics: see Neck Exam Skin: No concerning lesions. Musculoskeletal: symmetric strength and muscle tone throughout. Neurologic:  Cranial nerves II through XII are grossly intact. No obvious focalities. Speech is fluent. Coordination is intact. Psychiatric: Judgment and insight are intact. Affect is appropriate. Pelvic exam to be performed in the operating room with general anesthesia  ECOG = 2-3   LABORATORY DATA:  Lab Results  Component Value Date   WBC 7.7 05/18/2019   HGB 12.3 05/18/2019   HCT 42.1 05/18/2019   MCV 87.9 05/18/2019   PLT 234 05/18/2019   NEUTROABS 6.3 05/18/2019   Lab Results  Component Value Date   NA 139 05/18/2019   K 4.6 05/18/2019   CL 105 05/18/2019   CO2 25 05/18/2019   GLUCOSE 126 (H) 05/18/2019   CREATININE 1.05 (H) 05/18/2019   CALCIUM 9.4 05/18/2019      RADIOGRAPHY: No results found.    IMPRESSION: Clinical stage I,  grade 3 endometrial cancer (inoperable). Given the patients Performance status and comorbidities she is felt to be at high risk for surgery and therefore considered inoperable.  Patient has completed her external beam treatments and is now ready to proceed with brachii therapy as part of her definitive course of radiation therapy inoperable endometrial cancer.  PLAN: Patient will be taken to the operating room on September 3 for exam under anesthesia and placement of Heyman capsules within the endometrial cavity in preparation for high-dose-rate radiation therapy with iridium 192 as the high-dose-rate source.  Dr. Denman Braun will be present for the procedure who will dilate the cervical os and place  Heyman capsules.  Patient is scheduled to receive 2 brachii therapy treatments to complete her definitive course of radiation therapy.    ------------------------------------------------  Blair Promise, PhD, MD  This document serves as a record of services personally performed by Gery Pray, MD. It was created on his behalf by Wilburn Mylar, a trained medical scribe. The creation of this record is based on the scribe's personal observations and the provider's  statements to them. This document has been checked and approved by the attending provider.

## 2019-05-24 ENCOUNTER — Ambulatory Visit
Admission: RE | Admit: 2019-05-24 | Discharge: 2019-05-24 | Disposition: A | Discharge status: Home / Self Care | Payer: HMO | Source: Ambulatory Visit | Attending: Radiation Oncology | Admitting: Radiation Oncology

## 2019-05-24 ENCOUNTER — Ambulatory Visit: Admit: 2019-05-24 | Payer: HMO | Admitting: Radiation Oncology

## 2019-05-24 ENCOUNTER — Other Ambulatory Visit (HOSPITAL_COMMUNITY): Payer: HMO

## 2019-05-24 ENCOUNTER — Ambulatory Visit (HOSPITAL_COMMUNITY)
Admission: RE | Admit: 2019-05-24 | Discharge: 2019-05-24 | Disposition: A | Discharge status: Home / Self Care | Payer: HMO | Source: Ambulatory Visit | Attending: Radiation Oncology | Admitting: Radiation Oncology

## 2019-05-24 ENCOUNTER — Ambulatory Visit (HOSPITAL_COMMUNITY)
Admission: RE | Admit: 2019-05-24 | Discharge: 2019-05-24 | Disposition: A | Discharge status: Home / Self Care | Payer: HMO | Source: Other Acute Inpatient Hospital | Attending: Radiation Oncology | Admitting: Radiation Oncology

## 2019-05-24 ENCOUNTER — Ambulatory Visit (HOSPITAL_COMMUNITY): Payer: HMO | Admitting: Certified Registered Nurse Anesthetist

## 2019-05-24 ENCOUNTER — Encounter (HOSPITAL_COMMUNITY)
Admission: RE | Disposition: A | Discharge status: Home / Self Care | Payer: Self-pay | Source: Other Acute Inpatient Hospital | Attending: Radiation Oncology

## 2019-05-24 ENCOUNTER — Ambulatory Visit: Payer: HMO | Admitting: Radiation Oncology

## 2019-05-24 ENCOUNTER — Encounter (HOSPITAL_COMMUNITY): Payer: Self-pay

## 2019-05-24 ENCOUNTER — Ambulatory Visit (HOSPITAL_COMMUNITY): Payer: HMO | Admitting: Physician Assistant

## 2019-05-24 ENCOUNTER — Ambulatory Visit: Payer: Self-pay | Admitting: Pharmacist

## 2019-05-24 DIAGNOSIS — Z7984 Long term (current) use of oral hypoglycemic drugs: Secondary | ICD-10-CM | POA: Insufficient documentation

## 2019-05-24 DIAGNOSIS — F329 Major depressive disorder, single episode, unspecified: Secondary | ICD-10-CM | POA: Insufficient documentation

## 2019-05-24 DIAGNOSIS — C541 Malignant neoplasm of endometrium: Secondary | ICD-10-CM | POA: Insufficient documentation

## 2019-05-24 DIAGNOSIS — Z885 Allergy status to narcotic agent status: Secondary | ICD-10-CM | POA: Insufficient documentation

## 2019-05-24 DIAGNOSIS — J449 Chronic obstructive pulmonary disease, unspecified: Secondary | ICD-10-CM | POA: Diagnosis not present

## 2019-05-24 DIAGNOSIS — K219 Gastro-esophageal reflux disease without esophagitis: Secondary | ICD-10-CM | POA: Diagnosis not present

## 2019-05-24 DIAGNOSIS — Z510 Encounter for antineoplastic radiation therapy: Secondary | ICD-10-CM | POA: Insufficient documentation

## 2019-05-24 DIAGNOSIS — Z87891 Personal history of nicotine dependence: Secondary | ICD-10-CM | POA: Insufficient documentation

## 2019-05-24 DIAGNOSIS — Z79899 Other long term (current) drug therapy: Secondary | ICD-10-CM | POA: Diagnosis not present

## 2019-05-24 DIAGNOSIS — E119 Type 2 diabetes mellitus without complications: Secondary | ICD-10-CM | POA: Diagnosis not present

## 2019-05-24 DIAGNOSIS — R300 Dysuria: Secondary | ICD-10-CM | POA: Insufficient documentation

## 2019-05-24 DIAGNOSIS — F419 Anxiety disorder, unspecified: Secondary | ICD-10-CM | POA: Diagnosis not present

## 2019-05-24 DIAGNOSIS — M797 Fibromyalgia: Secondary | ICD-10-CM | POA: Diagnosis not present

## 2019-05-24 DIAGNOSIS — I10 Essential (primary) hypertension: Secondary | ICD-10-CM | POA: Insufficient documentation

## 2019-05-24 HISTORY — PX: DILATION AND CURETTAGE OF UTERUS: SHX78

## 2019-05-24 HISTORY — PX: TANDEM RING INSERTION: SHX6199

## 2019-05-24 LAB — GLUCOSE, CAPILLARY
Glucose-Capillary: 159 mg/dL — ABNORMAL HIGH (ref 70–99)
Glucose-Capillary: 150 mg/dL — ABNORMAL HIGH (ref 70–99)

## 2019-05-24 SURGERY — INSERTION, UTERINE TANDEM AND RING OR CYLINDER, FOR BRACHYTHERAPY
Anesthesia: General

## 2019-05-24 MED ORDER — FENTANYL CITRATE (PF) 100 MCG/2ML IJ SOLN
INTRAMUSCULAR | Status: AC
  Filled 2019-05-24: qty 2

## 2019-05-24 MED ORDER — LIDOCAINE 2% (20 MG/ML) 5 ML SYRINGE
INTRAMUSCULAR | Status: AC
  Filled 2019-05-24: qty 5

## 2019-05-24 MED ORDER — ONDANSETRON HCL 4 MG/2ML IJ SOLN
INTRAMUSCULAR | Status: AC
  Filled 2019-05-24: qty 2

## 2019-05-24 MED ORDER — ROCURONIUM BROMIDE 10 MG/ML (PF) SYRINGE
PREFILLED_SYRINGE | INTRAVENOUS | Status: AC
  Filled 2019-05-24: qty 10

## 2019-05-24 MED ORDER — HYDROMORPHONE HCL 1 MG/ML IJ SOLN
0.5000 mg | Freq: Once | INTRAMUSCULAR | Status: AC
  Administered 2019-05-24: 0.5 mg via INTRAVENOUS
  Filled 2019-05-24: qty 1

## 2019-05-24 MED ORDER — FENTANYL CITRATE (PF) 100 MCG/2ML IJ SOLN
INTRAMUSCULAR | Status: DC | PRN
  Administered 2019-05-24 (×5): 25 ug via INTRAVENOUS
  Administered 2019-05-24: 50 ug via INTRAVENOUS
  Administered 2019-05-24: 25 ug via INTRAVENOUS

## 2019-05-24 MED ORDER — PROPOFOL 10 MG/ML IV BOLUS: INTRAVENOUS | Status: DC | PRN

## 2019-05-24 MED ORDER — SCOPOLAMINE 1 MG/3DAYS TD PT72
MEDICATED_PATCH | TRANSDERMAL | Status: AC
  Filled 2019-05-24: qty 1

## 2019-05-24 MED ORDER — LORAZEPAM 1 MG PO TABS
1.0000 mg | ORAL_TABLET | Freq: Once | ORAL | Status: DC
  Filled 2019-05-24: qty 1

## 2019-05-24 MED ORDER — PROMETHAZINE HCL 25 MG/ML IJ SOLN: 6.2500 mg | INTRAMUSCULAR | Status: DC | PRN

## 2019-05-24 MED ORDER — PROPOFOL 500 MG/50ML IV EMUL
INTRAVENOUS | Status: DC | PRN
  Administered 2019-05-24: 170 mg via INTRAVENOUS

## 2019-05-24 MED ORDER — LACTATED RINGERS IV SOLN
INTRAVENOUS | Status: DC
  Administered 2019-05-24: 10:00:00 via INTRAVENOUS
  Filled 2019-05-24 (×2): qty 250

## 2019-05-24 MED ORDER — LIDOCAINE 2% (20 MG/ML) 5 ML SYRINGE
INTRAMUSCULAR | Status: DC | PRN
  Administered 2019-05-24: 60 mg via INTRAVENOUS

## 2019-05-24 MED ORDER — MEPERIDINE HCL 50 MG/ML IJ SOLN: 6.2500 mg | INTRAMUSCULAR | Status: DC | PRN

## 2019-05-24 MED ORDER — DEXAMETHASONE SODIUM PHOSPHATE 10 MG/ML IJ SOLN
INTRAMUSCULAR | Status: DC | PRN
  Administered 2019-05-24: 8 mg via INTRAVENOUS

## 2019-05-24 MED ORDER — ONDANSETRON HCL 4 MG/2ML IJ SOLN
INTRAMUSCULAR | Status: DC | PRN
  Administered 2019-05-24: 4 mg via INTRAVENOUS

## 2019-05-24 MED ORDER — LACTATED RINGERS IV SOLN
INTRAVENOUS | Status: DC
  Administered 2019-05-24: 07:00:00 via INTRAVENOUS

## 2019-05-24 MED ORDER — EPHEDRINE SULFATE-NACL 50-0.9 MG/10ML-% IV SOSY
PREFILLED_SYRINGE | INTRAVENOUS | Status: DC | PRN
  Administered 2019-05-24 (×2): 10 mg via INTRAVENOUS

## 2019-05-24 MED ORDER — DEXAMETHASONE SODIUM PHOSPHATE 10 MG/ML IJ SOLN
INTRAMUSCULAR | Status: AC
  Filled 2019-05-24: qty 1

## 2019-05-24 MED ORDER — ESTRADIOL 0.1 MG/GM VA CREA
TOPICAL_CREAM | VAGINAL | Status: AC
  Filled 2019-05-24: qty 42.5

## 2019-05-24 MED ORDER — FENTANYL CITRATE (PF) 100 MCG/2ML IJ SOLN
25.0000 ug | INTRAMUSCULAR | Status: DC | PRN
  Administered 2019-05-24 (×3): 50 ug via INTRAVENOUS

## 2019-05-24 MED ORDER — STERILE WATER FOR IRRIGATION IR SOLN
Status: DC | PRN
  Administered 2019-05-24: 500 mL

## 2019-05-24 MED ORDER — EPHEDRINE 5 MG/ML INJ
INTRAVENOUS | Status: AC
  Filled 2019-05-24: qty 10

## 2019-05-24 MED ORDER — PROPOFOL 10 MG/ML IV BOLUS
INTRAVENOUS | Status: AC
  Filled 2019-05-24: qty 40

## 2019-05-24 MED ORDER — STERILE WATER FOR IRRIGATION IR SOLN
Status: DC | PRN
  Administered 2019-05-24: 3000 mL via INTRAVESICAL

## 2019-05-24 MED ORDER — SCOPOLAMINE 1 MG/3DAYS TD PT72
1.0000 | MEDICATED_PATCH | TRANSDERMAL | Status: DC
  Administered 2019-05-24: 1.5 mg via TRANSDERMAL

## 2019-05-24 MED ORDER — MIDAZOLAM HCL 2 MG/2ML IJ SOLN
INTRAMUSCULAR | Status: AC
  Filled 2019-05-24: qty 2

## 2019-05-24 MED ORDER — MIDAZOLAM HCL 2 MG/2ML IJ SOLN
INTRAMUSCULAR | Status: DC | PRN
  Administered 2019-05-24: 2 mg via INTRAVENOUS

## 2019-05-24 SURGICAL SUPPLY — 26 items
BAG URINE DRAINAGE (UROLOGICAL SUPPLIES) ×2 IMPLANT
CATH FOLEY 3WAY  5CC 16FR (CATHETERS) ×1
CATH FOLEY 3WAY 5CC 16FR (CATHETERS) ×1 IMPLANT
COVER WAND RF STERILE (DRAPES) IMPLANT
DRAPE SHEET LG 3/4 BI-LAMINATE (DRAPES) ×2 IMPLANT
DRSG PAD ABDOMINAL 8X10 ST (GAUZE/BANDAGES/DRESSINGS) ×3 IMPLANT
GAUZE 4X4 16PLY RFD (DISPOSABLE) ×2 IMPLANT
GLOVE BIO SURGEON STRL SZ 6 (GLOVE) ×4 IMPLANT
GLOVE BIO SURGEON STRL SZ7.5 (GLOVE) ×4 IMPLANT
GOWN STRL REUS W/TWL LRG LVL3 (GOWN DISPOSABLE) ×4 IMPLANT
HOVERMATT SINGLE USE (MISCELLANEOUS) ×2 IMPLANT
KIT BASIN OR (CUSTOM PROCEDURE TRAY) ×2 IMPLANT
KIT TURNOVER KIT A (KITS) IMPLANT
PACK LITHOTOMY IV (CUSTOM PROCEDURE TRAY) ×2 IMPLANT
PACKING VAGINAL (PACKING) ×2 IMPLANT
PLUG CATH AND CAP STER (CATHETERS) ×2 IMPLANT
SET IRRIG Y TYPE TUR BLADDER L (SET/KITS/TRAYS/PACK) ×2 IMPLANT
SURGILUBE 2OZ TUBE FLIPTOP (MISCELLANEOUS) ×2 IMPLANT
SUT PROLENE 2 0 CT2 30 (SUTURE) IMPLANT
SUT VIC AB 0 CT1 27 (SUTURE)
SUT VIC AB 0 CT1 27XBRD ANTBC (SUTURE) IMPLANT
SYR 10ML LL (SYRINGE) ×2 IMPLANT
TOWEL OR 17X26 10 PK STRL BLUE (TOWEL DISPOSABLE) ×2 IMPLANT
UNDERPAD 30X36 HEAVY ABSORB (UNDERPADS AND DIAPERS) ×4 IMPLANT
WATER STERILE IRR 500ML POUR (IV SOLUTION) ×2 IMPLANT
YANKAUER SUCT BULB TIP 10FT TU (MISCELLANEOUS) ×2 IMPLANT

## 2019-05-24 NOTE — Anesthesia Procedure Notes (Signed)
Procedure Name: LMA Insertion Date/Time: 05/24/2019 7:32 AM Performed by: Eben Burow, CRNA Pre-anesthesia Checklist: Patient identified, Emergency Drugs available, Suction available, Patient being monitored and Timeout performed Patient Re-evaluated:Patient Re-evaluated prior to induction Oxygen Delivery Method: Circle system utilized Preoxygenation: Pre-oxygenation with 100% oxygen Induction Type: IV induction Ventilation: Mask ventilation without difficulty LMA: LMA inserted LMA Size: 4.0 Tube secured with: Tape Dental Injury: Teeth and Oropharynx as per pre-operative assessment

## 2019-05-24 NOTE — Progress Notes (Signed)
  Radiation Oncology         [phone]) [phone] ________________________________  Name: [name] MRN: [id number]  Date: 05/24/2019  DOB: Sep 20, 1961  CC: Marton Redwood, MD  [name], MD  HDR BRACHYTHERAPY NOTE  DIAGNOSIS: Clinical stage I,  grade 3 endometrial cancer (inoperable)  NARRATIVE: The patient was brought to the [place] suite. Identity was confirmed. All relevant records and images related to the planned course of therapy were reviewed. The patient freely provided informed written consent to proceed with treatment after reviewing the details related to the planned course of therapy. The consent form was witnessed and verified by the simulation staff. Then, the patient was set-up in a stable reproducible supine position for radiation therapy. The Heyman applicator catheters were accessed and fiducial markers were placed within the Heyman capsules.  A total of  7 Heyman applicator capsules were placed in the operating room however only 5 were used to deliver the treatment.  Simple treatment device note: While in the operating room the patient had placement and positioning of the applicator  system. She will be treated with  5 applicators.  Verification simulation note: An AP and lateral film was obtained through the pelvis area. This was compared to the patient's planning films documenting accurate position of the applicator system for treatment.  High-dose-rate brachytherapy treatment note:  The remote afterloading device was accessed through catheter system and attached to the [facility] system. Patient then proceeded to undergo her first high-dose-rate treatment directed at the uterus. The patient was prescribed a dose of 8.5 gray to be delivered to the high risk clinical target volume (HRCTV).. Patient was treated with 5 channels using 25 dwell positions. Treatment time was 581.1 seconds. The patient tolerated the procedure well. After completion of her therapy, a radiation survey was  performed documenting return of the iridium source into the [employer] safe. The patient was then transferred to the nursing suite. She then had removal of the 7 Heyman capsule applicators without difficulty.  Minimal bleeding occurred.  PLAN: Patient will return in approximately 10 days for her second high-dose-rate treatment which will complete her definitive course of radiation therapy. ________________________________  Blair Promise, PhD, MD

## 2019-05-24 NOTE — Progress Notes (Signed)
  Radiation Oncology         [phone]) [phone] ________________________________  Name: MAECY SCHEDLER MRN: [id number]  Date: 05/24/2019  DOB: 05/06/1961  SIMULATION AND TREATMENT PLANNING NOTE HDR BRACHYTHERAPY  DIAGNOSIS:  Clinical stage I,grade 3 endometrial cancer (Inoperable)  NARRATIVE:  The patient was brought to the [facility].  Identity was confirmed.  All relevant records and images related to the planned course of therapy were reviewed.  The patient freely provided informed written consent to proceed with treatment after reviewing the details related to the planned course of therapy. The consent form was witnessed and verified by the simulation staff.  Then, the patient was set-up in a stable reproducible  supine position for radiation therapy.  CT images were obtained.  Surface markings were placed.  The CT images were loaded into the planning software.  Then the target and avoidance structures were contoured.  Treatment planning then occurred.  The radiation prescription was entered and confirmed.   I have requested : Brachytherapy Isodose Plan and Dosimetry Calculations to plan the radiation distribution.    PLAN:  The patient will receive 8.5 Gy in 1 fraction directed at the high risk clinical target volume within the uterus.  Patient will be treated with iridium 192 with high-dose-rate source.  7 Heyman capsule applicators were placed within the endometrial cavity which will be used to deliver the treatment.    ________________________________  Blair Promise, PhD, MD   This document serves as a record of services personally performed by Gery Pray, MD. It was created on his behalf by Wilburn Mylar, a trained medical scribe. The creation of this record is based on the scribe's personal observations and the provider's statements to them. This document has been checked and approved by the attending provider.

## 2019-05-24 NOTE — Brief Op Note (Signed)
05/24/2019  9:12 AM  PATIENT:  [name]  58 y.o. female  PRE-OPERATIVE DIAGNOSIS:  ENDOMETRIAL CANCER  POST-OPERATIVE DIAGNOSIS:  ENDOMETRIAL CANCER  PROCEDURE:  Procedure(s): INSERTION OF HYMEN CAPSULE (N/A) DILATATION AND CURETTAGE (N/A)  SURGEON:  Surgeon(s) and Role:    * Gery Pray, MD - Primary    * [name], MD - Assisting  PHYSICIAN ASSISTANT:   ASSISTANTS: none   ANESTHESIA:   general  EBL:  < 10 ml  BLOOD ADMINISTERED:none  DRAINS: Urinary Catheter (Foley)   LOCAL MEDICATIONS USED:  NONE  SPECIMEN:  Source of Specimen:  Endometrial curettings  DISPOSITION OF SPECIMEN:  PATHOLOGY  COUNTS:  YES  TOURNIQUET:  * No tourniquets in log *  DICTATION: The patient was identified in the preoperative holding area. Informed consent was signed on the chart. Patient was seen,  history was reviewed and exam was performed. Timeout was performed the patient, procedure, antibiotic, allergy, and length of procedure. The patient was then taken to the operating room and placed in the supine position with SCD hose on. General anesthesia was then induced without difficulty. She was then placed in the dorsolithotomy position. The perineum was prepped with Betadine. The vagina was prepped with Betadine. The patient was then draped after the prep was dried. A foley catheter was placed to empty the bladder was performed under aseptic conditions. This catheter was backfilled with 200cc of sterile saline. The [place] was used to visualize the uterus in a saggital plane. The speculum was placed in the vagina. The single tooth tenaculum was placed on the anterior lip of the cervix by Dr. [name]. The uterine sound was placed in the cervix and advanced to the fundus to 6cm under US guidance. The cervix was successively dilated using pratts dilators to #27 by Dr. [name]. US guidance was performed throughout. Excellent Ultrasound images were obtained after adequate bladder filling A sharp  curette was advanced  By Dr. [name] to the fundus and a comprehensive curette of the endometrial cavity took place until a gritty feel was appreciated. The specimen was collected on a telfa and sent for permanent pathology. The Heyman's capsules (total of 7) were inserted into the uterine cavity under US guidance until no further capsules could be accommodated. I completed the procedure with labeling and securing of catheters.   Later in the day the patient will be transferred to radiation oncology for planning and treatment for her first brachii therapy procedure with iridium 192 as the high-dose-rate source.  She is to have a total of 2 brachii therapy procedures with the second occurring approximately 10 days later.   PLAN OF CARE: Transferred to radiation oncology for planning and treatment  PATIENT DISPOSITION:  PACU - hemodynamically stable.   Delay start of Pharmacological VTE agent (>24hrs) due to surgical blood loss or risk of bleeding: not applicable

## 2019-05-24 NOTE — Anesthesia Postprocedure Evaluation (Signed)
Anesthesia Post Note  Patient: [name]  Procedure(s) Performed: INSERTION OF HYMEN CAPSULE (N/A ) DILATATION AND CURETTAGE (N/A )     Patient location during evaluation: PACU Anesthesia Type: General Level of consciousness: sedated and patient cooperative Pain management: pain level controlled Vital Signs Assessment: post-procedure vital signs reviewed and stable Respiratory status: spontaneous breathing Cardiovascular status: stable Anesthetic complications: no    Last Vitals:  Vitals:   05/24/19 0915 05/24/19 0930  BP: 122/62 (!) 127/58  Pulse: 72 78  Resp:  14  Temp:  36.7 C  SpO2: 94% 90%    Last Pain:  Vitals:   05/24/19 0930  TempSrc:   PainSc: [place]

## 2019-05-24 NOTE — Op Note (Signed)
PATIENT: [name] DATE: 05/24/19   Preop Diagnosis: grade 3 endometrial cavity, non-operative candidate  Postoperative Diagnosis: same  Surgery: D&C (dilation and curettage), placement of Heyman capsules (brachytherapy device)  Surgeons:  Donaciano Eva, MD Co-[place]: Dr Gery Pray  Anesthesia: General   Estimated blood loss: <70ml  IVF:  293ml   Urine output: not recorded as bladder backfilled  Complications: None   Pathology: endometrial curetteings  Operative findings: grossly normal appearing cervix flush with vagina. Uterus sounded to 6cm, no intrauterine mass visualized on imaging.   Procedure: The patient was identified in the preoperative holding area. Informed consent was signed on the chart. Patient was seen history was reviewed and exam was performed.   The patient was then taken to the operating room and placed in the supine position with SCD hose on. General anesthesia was then induced without difficulty. She was then placed in the dorsolithotomy position. The perineum was prepped with Betadine. The vagina was prepped with Betadine. The patient was then draped after the prep was dried. A foley catheter was placed to empty the bladder was performed under aseptic conditions. This catheter was backfilled with 200cc of sterile saline.  The [place] was used to visualize the uterus in a saggital plane.  Timeout was performed the patient, procedure, antibiotic, allergy, and length of procedure.   The speculum was placed in the vagina. The single tooth tenaculum was placed on the anterior lip of the cervix. The uterine sound was placed in the cervix and advanced to the fundus to 6cm under US guidance. The cervix was successively dilated using pratts dilators to #27. US guidance was performed throughout. A sharp curette was advanced to the fundus and a comprehensive curette of the endometrial cavity took place until a gritty feel was appreciated. The  specimen was collected on a telfa and sent for permanent pathology.  The Heyman's capsules (total of 7) were inserted into the uterine cavity under US guidance until no further capsules could be accommodated.   The tenaculum was removed and hemostasis was observed.   Dr Sondra Come completed the procedure with labeling and securing of catheters.   All instrument, suture, laparotomy, Ray-Tec, and needle counts were correct x2. The patient tolerated the procedure well and was taken recovery room in stable condition. This is [name] dictating an operative note on [name].

## 2019-05-24 NOTE — Interval H&P Note (Signed)
History and Physical Interval Note:  05/24/2019 7:19 AM  [name]  has presented today for surgery, with the diagnosis of ENDOMETRIAL CANCER.  The various methods of treatment have been discussed with the patient and family. After consideration of risks, benefits and other options for treatment, the patient has consented to  Procedure(s): INSERTION OF HYMEN CAPSULE (N/A) as a surgical intervention.  The patient's history has been reviewed, patient examined, no change in status, stable for surgery.  I have reviewed the patient's chart and labs.  Questions were answered to the patient's satisfaction.     Gery Pray

## 2019-05-24 NOTE — Transfer of Care (Signed)
Immediate Anesthesia Transfer of Care Note  Patient: [name]  Procedure(s) Performed: INSERTION OF HYMEN CAPSULE (N/A ) DILATATION AND CURETTAGE (N/A )  Patient Location: PACU  Anesthesia Type:General  Level of Consciousness: awake, alert  and oriented  Airway & Oxygen Therapy: Patient Spontanous Breathing and Patient connected to face mask oxygen  Post-op Assessment: Report given to RN and Post -op Vital signs reviewed and stable  Post vital signs: Reviewed and stable  Last Vitals:  Vitals Value Taken Time  BP 143/58 05/24/19 0834  Temp    Pulse 82 05/24/19 0837  Resp    SpO2 95 % 05/24/19 0837  Vitals shown include unvalidated device data.  Last Pain:  Vitals:   05/24/19 0621  TempSrc: Oral  PainSc:          Complications: No apparent anesthesia complications

## 2019-05-25 ENCOUNTER — Encounter (HOSPITAL_COMMUNITY): Payer: Self-pay | Admitting: Radiation Oncology

## 2019-05-25 ENCOUNTER — Ambulatory Visit (HOSPITAL_COMMUNITY): Payer: HMO

## 2019-05-30 ENCOUNTER — Encounter (HOSPITAL_COMMUNITY): Payer: Self-pay

## 2019-05-30 ENCOUNTER — Other Ambulatory Visit: Payer: Self-pay

## 2019-05-30 NOTE — Progress Notes (Signed)
PCP - Dr. Dione Housekeeper Cardiologist - Dr. Quay Burow cardiac clearance note 11/22/2018  Chest x-ray - N/A EKG - 12/12/2018 Stress Test - 02/27/2019 ECHO - N/A Cardiac Cath - N/A  Sleep Study - N/A CPAP - N/A  Fasting Blood Sugar - 130 Checks Blood Sugar ___2__ times a day  Blood Thinner Instructions:N/A Aspirin Instructions:N/A Last Dose:N/A  Anesthesia review: N/A  Patient denies shortness of breath, fever, cough and chest pain at PAT appointment   Patient verbalized understanding of instructions that were given to them at the PAT appointment. Patient was also instructed that they will need to review over the PAT instructions again at home before surgery.

## 2019-05-31 ENCOUNTER — Encounter (HOSPITAL_COMMUNITY): Payer: Self-pay

## 2019-06-01 ENCOUNTER — Other Ambulatory Visit (HOSPITAL_COMMUNITY)
Admission: RE | Admit: 2019-06-01 | Discharge: 2019-06-01 | Disposition: A | Discharge status: Home / Self Care | Payer: HMO | Source: Ambulatory Visit | Attending: Radiation Oncology | Admitting: Radiation Oncology

## 2019-06-01 ENCOUNTER — Other Ambulatory Visit: Payer: Self-pay | Admitting: Pharmacist

## 2019-06-01 DIAGNOSIS — Z20828 Contact with and (suspected) exposure to other viral communicable diseases: Secondary | ICD-10-CM | POA: Insufficient documentation

## 2019-06-01 DIAGNOSIS — Z01812 Encounter for preprocedural laboratory examination: Secondary | ICD-10-CM | POA: Insufficient documentation

## 2019-06-01 NOTE — Patient Outreach (Signed)
[facility]) Care Management  [place]   06/01/2019  [name] Jan 19, 1961 [id number]  Reason for referral: Medication Review  Referral source: [employer] Care Manager with [facility] Current insurance: [employer]  PMHx includes but not limited to:   Acid reflux, COPD, pulmonary edema, hypertension, fibromyalgia, anxiety, hyperlipidemia, restless legs syndrome.  Patient has been undergoing radiation for the treatment  of uterine/endometrium fibroids.  Outreach:  Successful telephone call with patient.  HIPAA identifiers verified.   Subjective:  Patient is a 58 year old female with the above mentioned medical conditions.  She reported feeling "fine" today other than being "in between procedures". Patient underwent a D&C (dilation and curettage) and placement of Heyman capsules 05/24/2019.  Does the patient ever forget to take medication?  no Does the patient have problems obtaining medications due to transportation?   no Does the patient have problems obtaining medications due to cost?  no  Does the patient feel that medications prescribed are effective?  yes Does the patient ever experience any side effects to the medications prescribed?  Yes (nausea, vomiting, and diarrhea from Radiation)  Objective: The ASCVD Risk score Mikey Bussing DC Jr., et al., 2013) failed to calculate for the following reasons:   Cannot find a previous HDL lab   Cannot find a previous total cholesterol lab  Lab Results  Component Value Date   CREATININE 1.05 (H) 05/18/2019   CREATININE 0.75 04/23/2019   CREATININE 0.81 03/09/2019    Lab Results  Component Value Date   HGBA1C 6.8 (H) 05/18/2019    Lipid Panel  No results found for: CHOL, TRIG, HDL, CHOLHDL, VLDL, LDLCALC, LDLDIRECT  BP Readings from Last 3 Encounters:  05/24/19 (!) 127/58  05/18/19 (!) 139/53  03/21/19 (!) 105/58    Allergies  Allergen Reactions  . Tramadol     Per  Dr. Sharyon Medicus is renal failure, patient states that she is currently taking this medication and she has not had a reaction to Tramadol since this time.    Medications Reviewed Today    Reviewed by Elayne Guerin, [facility] (Pharmacist) on 06/01/19 at 1013  Med List Status: <None>  Medication Order Taking? Sig Documenting Provider Last Dose Status Informant  atorvastatin (LIPITOR) 40 MG tablet [id number] Yes Take 40 mg by mouth at bedtime.  [provider] Taking Active Self           Med Note Tressie Stalker D   Wed Jul 03, 2014  7:54 PM)    clonazePAM (KLONOPIN) 1 MG tablet [id number] Yes Take 1 tab twice daily  Patient taking differently: Take 1 mg by mouth 2 (two) times daily.    Kathlee Nations, MD Taking Active Self  dicyclomine (BENTYL) 10 MG capsule [id number] Yes Take 10 mg by mouth every 8 (eight) hours as needed for spasms (abdominal spasms).  [provider] Taking Active Self  FLUoxetine (PROZAC) 40 MG capsule [id number] Yes Take 1 capsule (40 mg total) by mouth daily. Kathlee Nations, MD Taking Active Self  lamoTRIgine (LAMICTAL) 150 MG tablet [id number] Yes Take 1 tablet (150 mg total) by mouth 2 (two) times daily. Kathlee Nations, MD Taking Active Self  lisinopril (PRINIVIL,ZESTRIL) 20 MG tablet [id number] Yes Take 20 mg by mouth daily.  [provider] Taking Active Self  metFORMIN (GLUCOPHAGE) 1000 MG tablet [id number] Yes Take 1,000 mg by mouth 2 (two) times daily with a meal.  [provider] Taking Active Self  Med Note Elayne Guerin   Fri Jun 01, 2019 10:10 AM)    morphine (MS CONTIN) 15 MG 12 hr tablet [id number] Yes Take 1 tablet (15 mg total) by mouth every 12 (twelve) hours. Gery Pray, MD Taking Active Self  omeprazole (PRILOSEC) 40 MG capsule [id number] Yes Take 40 mg by mouth daily before breakfast.  [provider] Taking Active Self  ONETOUCH VERIO test strip [id number] Yes CHECK SUGARS TWICE A DAY DX  DM2, INSULIN  DEPENDANT [provider] Taking Active   prazosin (MINIPRESS) 5 MG capsule [id number] Yes Take 1 capsule (5 mg total) by mouth at bedtime. Kathlee Nations, MD Taking Active Self  prochlorperazine (COMPAZINE) 10 MG tablet [id number] Yes Take 1 tablet (10 mg total) by mouth every 6 (six) hours as needed for nausea or vomiting. Gery Pray, MD Taking Active Self  traMADol (ULTRAM) 50 MG tablet [id number] No Take 100 mg by mouth as needed. Patient states she takes two pills every 8 hours as needed for pain. [provider] Not Taking Active Self           Med Note Kenton Kingfisher, Germaine Pomfret May 16, 2019 11:15 AM) On hold due to morphine use  trazodone (DESYREL) 300 MG tablet [id number] Yes Take 1 tablet (300 mg total) by mouth at bedtime. Kathlee Nations, MD Taking Active Self  VENTOLIN HFA 108 (90 BASE) MCG/ACT inhaler [id number] Yes Inhale 2 puffs into the lungs every 6 (six) hours as needed for wheezing or shortness of breath.  [provider] Taking Active Self           Med Note Tressie Stalker D   Wed Jul 03, 2014  7:54 PM)            Assessment: Drugs sorted by system:  Neurologic/Psychologic: Clonazepam, Fluoxetine, Lamotrigine, Prazosin, Trazodone  Cardiovascular:  Atorvastatin,   Pulmonary/Allergy: Ventolin HFA  Gastrointestinal: Dicyclomine, Omeprazole, Prochlorperazine  Endocrine: Metformin, One Touch   Pain: Morphine ER 15 mg, Tramadol (on hold)  Infectious Diseases:  Medication Review Findings:  . Increased risk of CNS depression with clonazepam and Morphine ER.   HgA1c  Medication Adherence Findings:  Patient is taking all generic medications with the exception of Ventolin HFA. Albuterol HFA is now available generically and the patient said she only uses one inhaler per year.  Patient with good understanding of regimen and good understanding of indications.    Medication Assistance Findings:  No medication assistance needs  identified  Extra Help:  Not eligible for Extra Help Low Income Subsidy based on reported income and assets  Patient Assistance Programs:  N/a  Patient has a scheduled call with [facility] CSNP Nurse, Peter Garter.  Plan: . Will follow-up in 3 months.Elayne Guerin, PharmD, [place] Clinical Pharmacist [phone]

## 2019-06-03 LAB — NOVEL CORONAVIRUS, NAA (HOSP ORDER, SEND-OUT TO REF LAB; TAT 18-24 HRS): SARS-CoV-2, NAA: NOT DETECTED

## 2019-06-05 ENCOUNTER — Ambulatory Visit: Payer: HMO | Admitting: Radiation Oncology

## 2019-06-05 ENCOUNTER — Ambulatory Visit (HOSPITAL_COMMUNITY)
Admission: RE | Admit: 2019-06-05 | Discharge: 2019-06-05 | Disposition: A | Discharge status: Home / Self Care | Payer: HMO | Source: Ambulatory Visit | Attending: Radiation Oncology | Admitting: Radiation Oncology

## 2019-06-05 ENCOUNTER — Ambulatory Visit (HOSPITAL_COMMUNITY): Admission: RE | Admit: 2019-06-05 | Payer: HMO | Source: Other Acute Inpatient Hospital | Admitting: Radiation Oncology

## 2019-06-05 ENCOUNTER — Other Ambulatory Visit: Payer: Self-pay

## 2019-06-05 HISTORY — DX: Hyperlipidemia, unspecified: E78.5

## 2019-06-05 HISTORY — DX: Chronic pain syndrome: G89.4

## 2019-06-05 HISTORY — DX: Malignant neoplasm of endometrium: C54.1

## 2019-06-05 SURGERY — INSERTION, UTERINE TANDEM AND RING OR CYLINDER, FOR BRACHYTHERAPY
Anesthesia: General

## 2019-06-05 NOTE — Patient Outreach (Signed)
  [facility] [facility]) Care Management Chronic Special Needs Program  06/05/2019  Name: [name] DOB: 26-Nov-1960  MRN: [id number]  Ms. [name] is enrolled in a chronic special needs plan for Diabetes. Reviewed and updated care plan.  Subjective: Client states that she has completed her radiation treatments and she is almost done the implant treatments.  States that the radiation made her sick so she was not eating as much.  States her appetite is coming back.  States that she does not check her blood sugars as her last Hemoglobin A1C was 6.2%.  Denies any difficultly affording her medications now. States she has the Advanced Directives forms but has not completed.  Goals Addressed            This Visit's Progress   . COMPLETED:  Acknowledge receipt of Advanced Directive package       Received forms    . Advanced Care Planning complete by next 9 months(continue 06/05/19)   On track   . Client understands the importance of follow-up with providers by attending scheduled visits   On track   . COMPLETED: Client will report abillity to obtain Medications within next 6 months      . Client will use Assistive Devices as needed and verbalize understanding of device use   On track   . Client will verbalize knowledge of chronic lung disease as evidenced by no ED visits or Inpatient stays related to chronic lung disease    On track   . Client will verbalize knowledge of self management of Hypertension as evidences by BP reading of 140/90 or less; or as defined by provider   On track   . Maintain timely refills of diabetic medication as prescribed within the year .   On track   . COMPLETED: Obtain annual  Lipid Profile, LDL-C       Completed 05/07/19    . Obtain Annual Eye (retinal)  Exam    On track   . Obtain Annual Foot Exam   On track   . Obtain annual screen for micro albuminuria (urine) , nephropathy (kidney problems)   On track   . COMPLETED: Obtain Hemoglobin A1C  at least 2 times per year       Completed 11/01/18, 05/07/19    . Visit Primary Care Provider or Endocrinologist at least 2 times per year    On track    Client is meeting diabetes self management goal of hemoglobin A1C of <7% with last reading of 6.2% Reinforced to follow low CHO diet  Encouraged to increase activity when she is recovered from her radiation.  Encouraged to call and schedule annual dilated eye exam Reviewed number for 24-hour nurse Line Discussed COVID19 cause, symptoms, precautions (social distancing, stay at home order, hand washing), confirmed client knows how to contact provider. Plan:  Send successful outreach letter with a copy of their individualized care plan and Send individual care plan to provider  Chronic care management coordinator will outreach in:  4-6 Months     Peter Garter RN, [facility], [place] Management Coordinator [place] Management [phone]

## 2019-06-06 ENCOUNTER — Other Ambulatory Visit (HOSPITAL_COMMUNITY): Payer: Self-pay | Admitting: Radiation Oncology

## 2019-06-06 DIAGNOSIS — C541 Malignant neoplasm of endometrium: Secondary | ICD-10-CM

## 2019-06-11 ENCOUNTER — Other Ambulatory Visit (HOSPITAL_COMMUNITY)
Admission: RE | Admit: 2019-06-11 | Discharge: 2019-06-11 | Disposition: A | Discharge status: Home / Self Care | Payer: HMO | Source: Ambulatory Visit | Attending: Radiation Oncology | Admitting: Radiation Oncology

## 2019-06-11 ENCOUNTER — Encounter (HOSPITAL_COMMUNITY): Payer: Self-pay

## 2019-06-11 ENCOUNTER — Encounter (HOSPITAL_COMMUNITY)
Admission: RE | Admit: 2019-06-11 | Discharge: 2019-06-11 | Disposition: A | Discharge status: Home / Self Care | Payer: HMO | Source: Ambulatory Visit | Attending: Radiation Oncology | Admitting: Radiation Oncology

## 2019-06-11 ENCOUNTER — Other Ambulatory Visit: Payer: Self-pay

## 2019-06-11 ENCOUNTER — Telehealth: Payer: Self-pay | Admitting: Oncology

## 2019-06-11 DIAGNOSIS — Z01812 Encounter for preprocedural laboratory examination: Secondary | ICD-10-CM | POA: Diagnosis not present

## 2019-06-11 DIAGNOSIS — C541 Malignant neoplasm of endometrium: Secondary | ICD-10-CM | POA: Insufficient documentation

## 2019-06-11 DIAGNOSIS — Z20828 Contact with and (suspected) exposure to other viral communicable diseases: Secondary | ICD-10-CM | POA: Diagnosis not present

## 2019-06-11 LAB — CBC
HCT: 42.9 % (ref 36.0–46.0)
Hemoglobin: 12.6 g/dL (ref 12.0–15.0)
MCH: 25.6 pg — ABNORMAL LOW (ref 26.0–34.0)
MCHC: 29.4 g/dL — ABNORMAL LOW (ref 30.0–36.0)
MCV: 87.2 fL (ref 80.0–100.0)
Platelets: ADEQUATE 10*3/uL (ref 150–400)
RBC: 4.92 MIL/uL (ref 3.87–5.11)
RDW: 21.3 % — ABNORMAL HIGH (ref 11.5–15.5)
WBC: 5.7 10*3/uL (ref 4.0–10.5)
nRBC: 0 % (ref 0.0–0.2)

## 2019-06-11 LAB — BASIC METABOLIC PANEL
Anion gap: 11 (ref 5–15)
BUN: 13 mg/dL (ref 6–20)
CO2: 22 mmol/L (ref 22–32)
Calcium: 9.3 mg/dL (ref 8.9–10.3)
Chloride: 109 mmol/L (ref 98–111)
Creatinine, Ser: 0.93 mg/dL (ref 0.44–1.00)
GFR calc Af Amer: >60 mL/min (ref >60)
GFR calc non Af Amer: >60 mL/min (ref >60)
Glucose, Bld: 126 mg/dL — ABNORMAL HIGH (ref 70–99)
Potassium: 3.9 mmol/L (ref 3.5–5.1)
Sodium: 142 mmol/L (ref 135–145)

## 2019-06-11 LAB — GLUCOSE, CAPILLARY: Glucose-Capillary: 116 mg/dL — ABNORMAL HIGH (ref 70–99)

## 2019-06-11 NOTE — Progress Notes (Signed)
PHARMACY DID NOT COME FOR PRE OP , PHARMACY TOLD TO CALL PT

## 2019-06-11 NOTE — Progress Notes (Signed)
PCP - DR Marton Redwood Cardiologist - DR [facility] CARDIAC CLEARANCE DR BERRY 12-12-18 EPIC  Chest x-ray - CHEST CT 11-10-18 EPIC EKG - 12-12-18 Stress Test - 02-27-19 ECHO - NONE Cardiac Cath - NONE  Sleep Study - NONE CPAP - NONE  Fasting Blood Sugar -  Checks Blood Sugar DOES NOT CHECK CBG  Blood Thinner Instructions:NONE Aspirin Instructions:NONE Last Dose: NONE  Anesthesia review: CHART TO JESSICA ZANETTO PA FOR REVIEW  Patient denies shortness of breath, fever, cough and chest pain at PAT appointment   Patient verbalized understanding of instructions that were given to them at the PAT appointment. Patient was also instructed that they will need to review over the PAT instructions again at home before surgery.

## 2019-06-11 NOTE — Telephone Encounter (Signed)
[name] called and asked about when her next HDR treatment will be scheduled.  Advised her that is has been scheduled for 06/14/19 and that Preop should be calling her with instructions on when to arrive.

## 2019-06-11 NOTE — Patient Instructions (Addendum)
DUE TO COVID-19 ONLY ONE VISITOR IS ALLOWED TO COME WITH YOU AND STAY IN THE WAITING ROOM ONLY DURING PRE OP AND PROCEDURE DAY OF SURGERY. THE 1 VISITOR MAY VISIT WITH YOU AFTER SURGERY IN YOUR PRIVATE ROOM DURING VISITING HOURS ONLY!   ONCE YOUR COVID TEST IS COMPLETED, PLEASE BEGIN THE QUARANTINE INSTRUCTIONS AS OUTLINED IN YOUR HANDOUT.                [name]    Your procedure is scheduled on: 06-14-2019   Report to [facility] Main  Entrance   Report to short stay  At 530 AM     Call this number if you have problems the morning of surgery [phone]    Remember: Do not eat food or drink liquids :After Midnight. BRUSH YOUR TEETH MORNING OF SURGERY AND RINSE YOUR MOUTH OUT, NO CHEWING GUM CANDY OR MINTS.     Take these medicines the morning of surgery with A SIP OF WATER: MS CONTIN, VENTOLIN INHALER IF NEEDED AND BRING INHALER, CLONAZEPAM, LAMICTAL, OMEPRAZOLE  DO NOT TAKE ANY DIABETIC MEDICATIONS DAY OF YOUR SURGERY  TAKE METFORMIN AS USUAL DAY BEFORE SURGERY, DO NOT TAKE METFORMIN DAY OF SUGRERY                               You may not have any metal on your body including hair pins and              piercings  Do not wear jewelry, make-up, lotions, powders or perfumes, deodorant             Do not wear nail polish.  Do not shave  48 hours prior to surgery.               Do not bring valuables to the hospital. [place].  Contacts, dentures or bridgework may not be worn into surgery.  Leave suitcase in the car. After surgery it may be brought to your room.     Patients discharged the day of surgery will not be allowed to drive home. IF YOU ARE HAVING SURGERY AND GOING HOME THE SAME DAY, YOU MUST HAVE AN ADULT TO DRIVE YOU HOME AND BE WITH YOU FOR 24 HOURS. YOU MAY GO HOME BY TAXI OR UBER OR ORTHERWISE, BUT AN ADULT MUST ACCOMPANY YOU HOME AND STAY WITH YOU FOR 24 HOURS.  Name and phone number of your driver:  Micheline Chapman CELL [phone]  Special Instructions: N/A              Please read over the following fact sheets you were given: _____________________________________________________________________             [facility] - Preparing for Surgery Before surgery, you can play an important role.  Because skin is not sterile, your skin needs to be as free of germs as possible.  You can reduce the number of germs on your skin by washing with CHG (chlorahexidine gluconate) soap before surgery.  CHG is an antiseptic cleaner which kills germs and bonds with the skin to continue killing germs even after washing. Please DO NOT use if you have an allergy to CHG or antibacterial soaps.  If your skin becomes reddened/irritated stop using the CHG and inform your nurse when you arrive at Short Stay. Do  not shave (including legs and underarms) for at least 48 hours prior to the first CHG shower.  You may shave your face/neck. Please follow these instructions carefully:  1.  Shower with CHG Soap the night before surgery and the  morning of Surgery.  2.  If you choose to wash your hair, wash your hair first as usual with your  normal  shampoo.  3.  After you shampoo, rinse your hair and body thoroughly to remove the  shampoo.                           4.  Use CHG as you would any other liquid soap.  You can apply chg directly  to the skin and wash                       Gently with a scrungie or clean washcloth.  5.  Apply the CHG Soap to your body ONLY FROM THE NECK DOWN.   Do not use on face/ open                           Wound or open sores. Avoid contact with eyes, ears mouth and genitals (private parts).                       Wash face,  Genitals (private parts) with your normal soap.             6.  Wash thoroughly, paying special attention to the area where your surgery  will be performed.  7.  Thoroughly rinse your body with warm water from the neck down.  8.  DO NOT shower/wash with your normal soap  after using and rinsing off  the CHG Soap.                9.  Pat yourself dry with a clean towel.            10.  Wear clean pajamas.            11.  Place clean sheets on your bed the night of your first shower and do not  sleep with pets. Day of Surgery : Do not apply any lotions/deodorants the morning of surgery.  Please wear clean clothes to the hospital/surgery center.  FAILURE TO FOLLOW THESE INSTRUCTIONS MAY RESULT IN THE CANCELLATION OF YOUR SURGERY PATIENT SIGNATURE_________________________________  NURSE SIGNATURE__________________________________  ________________________________________________________________________

## 2019-06-12 ENCOUNTER — Other Ambulatory Visit (HOSPITAL_COMMUNITY): Payer: Self-pay | Admitting: Radiation Oncology

## 2019-06-12 DIAGNOSIS — C541 Malignant neoplasm of endometrium: Secondary | ICD-10-CM

## 2019-06-13 ENCOUNTER — Telehealth: Payer: Self-pay | Admitting: Oncology

## 2019-06-13 LAB — NOVEL CORONAVIRUS, NAA (HOSP ORDER, SEND-OUT TO REF LAB; TAT 18-24 HRS): SARS-CoV-2, NAA: NOT DETECTED

## 2019-06-13 NOTE — Telephone Encounter (Signed)
Called [name] and discussed her procedure tomorrow.  She does not have any questions and said she is feeling good.  Advised her to call if she needs anything.

## 2019-06-13 NOTE — Anesthesia Preprocedure Evaluation (Addendum)
Anesthesia Evaluation  Patient identified by MRN, date of birth, ID band Patient awake    Reviewed: Allergy & Precautions, NPO status , Patient's Chart, lab work & pertinent test results  Airway Mallampati: II  TM Distance: >3 FB Neck ROM: Full    Dental  (+) Missing, Poor Dentition, Loose,    Pulmonary asthma , COPD,  COPD inhaler, former smoker,    Pulmonary exam normal breath sounds clear to auscultation       Cardiovascular hypertension, Normal cardiovascular exam Rhythm:Regular Rate:Normal  ECG: SR, PAC's, rate 79  Myocardial perfusion The left ventricular ejection fraction is hyperdynamic (>65%). Nuclear stress EF: 68%. There was no ST segment deviation noted during stress. Anterior wall mild perfusion defect seen at both rest and stress. Breast attenuation artifact. This is a low risk study. No ischemia identified.  Candee Furbish, MD   Neuro/Psych  Headaches, PSYCHIATRIC DISORDERS Anxiety Depression    GI/Hepatic Neg liver ROS, GERD  Medicated and Controlled,  Endo/Other  diabetes, Oral Hypoglycemic Agents  Renal/GU negative Renal ROS     Musculoskeletal  (+) Fibromyalgia -, narcotic dependentRestless leg syndrome Chronic pain syndrome   Abdominal (+) + obese,   Peds  Hematology HLD   Anesthesia Other Findings ENDOMETRIAL CANCER  Reproductive/Obstetrics                            Anesthesia Physical Anesthesia Plan  ASA: III  Anesthesia Plan: General   Post-op Pain Management:    Induction: Intravenous  PONV Risk Score and Plan: 4 or greater and Midazolam, Dexamethasone, Ondansetron and Treatment may vary due to age or medical condition  Airway Management Planned: LMA  Additional Equipment:   Intra-op Plan:   Post-operative Plan: Extubation in OR  Informed Consent: I have reviewed the patients History and Physical, chart, labs and discussed the procedure including the  risks, benefits and alternatives for the proposed anesthesia with the patient or authorized representative who has indicated his/her understanding and acceptance.     Dental advisory given  Plan Discussed with: CRNA  Anesthesia Plan Comments:        Anesthesia Quick Evaluation

## 2019-06-14 ENCOUNTER — Ambulatory Visit (HOSPITAL_COMMUNITY)
Admission: RE | Admit: 2019-06-14 | Discharge: 2019-06-14 | Disposition: A | Discharge status: Home / Self Care | Payer: HMO | Source: Ambulatory Visit | Attending: Radiation Oncology | Admitting: Radiation Oncology

## 2019-06-14 ENCOUNTER — Ambulatory Visit
Admission: RE | Admit: 2019-06-14 | Discharge: 2019-06-14 | Disposition: A | Discharge status: Home / Self Care | Payer: HMO | Source: Ambulatory Visit | Attending: Radiation Oncology | Admitting: Radiation Oncology

## 2019-06-14 ENCOUNTER — Ambulatory Visit (HOSPITAL_COMMUNITY): Payer: HMO

## 2019-06-14 ENCOUNTER — Encounter (HOSPITAL_COMMUNITY)
Admission: RE | Disposition: A | Discharge status: Home / Self Care | Payer: Self-pay | Source: Other Acute Inpatient Hospital | Attending: Radiation Oncology

## 2019-06-14 ENCOUNTER — Encounter: Payer: Self-pay | Admitting: Radiation Oncology

## 2019-06-14 ENCOUNTER — Encounter (HOSPITAL_COMMUNITY): Payer: Self-pay

## 2019-06-14 ENCOUNTER — Ambulatory Visit (HOSPITAL_COMMUNITY)
Admission: RE | Admit: 2019-06-14 | Discharge: 2019-06-14 | Disposition: A | Discharge status: Home / Self Care | Payer: HMO | Source: Other Acute Inpatient Hospital | Attending: Radiation Oncology | Admitting: Radiation Oncology

## 2019-06-14 DIAGNOSIS — C541 Malignant neoplasm of endometrium: Secondary | ICD-10-CM

## 2019-06-14 DIAGNOSIS — Z7984 Long term (current) use of oral hypoglycemic drugs: Secondary | ICD-10-CM | POA: Insufficient documentation

## 2019-06-14 DIAGNOSIS — E119 Type 2 diabetes mellitus without complications: Secondary | ICD-10-CM | POA: Insufficient documentation

## 2019-06-14 DIAGNOSIS — Z79891 Long term (current) use of opiate analgesic: Secondary | ICD-10-CM | POA: Insufficient documentation

## 2019-06-14 DIAGNOSIS — Z87891 Personal history of nicotine dependence: Secondary | ICD-10-CM | POA: Diagnosis not present

## 2019-06-14 DIAGNOSIS — F329 Major depressive disorder, single episode, unspecified: Secondary | ICD-10-CM | POA: Insufficient documentation

## 2019-06-14 DIAGNOSIS — K219 Gastro-esophageal reflux disease without esophagitis: Secondary | ICD-10-CM | POA: Diagnosis not present

## 2019-06-14 DIAGNOSIS — Z923 Personal history of irradiation: Secondary | ICD-10-CM | POA: Diagnosis not present

## 2019-06-14 DIAGNOSIS — I10 Essential (primary) hypertension: Secondary | ICD-10-CM | POA: Diagnosis not present

## 2019-06-14 DIAGNOSIS — F419 Anxiety disorder, unspecified: Secondary | ICD-10-CM | POA: Diagnosis not present

## 2019-06-14 DIAGNOSIS — J449 Chronic obstructive pulmonary disease, unspecified: Secondary | ICD-10-CM | POA: Insufficient documentation

## 2019-06-14 DIAGNOSIS — Z79899 Other long term (current) drug therapy: Secondary | ICD-10-CM | POA: Diagnosis not present

## 2019-06-14 DIAGNOSIS — E785 Hyperlipidemia, unspecified: Secondary | ICD-10-CM | POA: Diagnosis not present

## 2019-06-14 HISTORY — PX: OPERATIVE ULTRASOUND: SHX5996

## 2019-06-14 HISTORY — PX: TANDEM RING INSERTION: SHX6199

## 2019-06-14 LAB — GLUCOSE, CAPILLARY: Glucose-Capillary: 136 mg/dL — ABNORMAL HIGH (ref 70–99)

## 2019-06-14 SURGERY — INSERTION, UTERINE TANDEM AND RING OR CYLINDER, FOR BRACHYTHERAPY
Anesthesia: General | Site: Vagina

## 2019-06-14 MED ORDER — ONDANSETRON HCL 4 MG/2ML IJ SOLN
INTRAMUSCULAR | Status: DC | PRN
  Administered 2019-06-14: 4 mg via INTRAVENOUS

## 2019-06-14 MED ORDER — STERILE WATER FOR IRRIGATION IR SOLN
Status: DC | PRN
  Administered 2019-06-14: 3000 mL via INTRAVESICAL

## 2019-06-14 MED ORDER — MIDAZOLAM HCL 5 MG/5ML IJ SOLN
INTRAMUSCULAR | Status: DC | PRN
  Administered 2019-06-14: 2 mg via INTRAVENOUS

## 2019-06-14 MED ORDER — HYDROMORPHONE HCL 1 MG/ML IJ SOLN
0.2500 mg | INTRAMUSCULAR | Status: DC | PRN
  Administered 2019-06-14 (×3): 0.5 mg via INTRAVENOUS

## 2019-06-14 MED ORDER — KETOROLAC TROMETHAMINE 30 MG/ML IJ SOLN: 30.0000 mg | Freq: Once | INTRAMUSCULAR | Status: AC | PRN

## 2019-06-14 MED ORDER — HYDROMORPHONE HCL 1 MG/ML IJ SOLN
INTRAMUSCULAR | Status: AC
  Filled 2019-06-14: qty 1

## 2019-06-14 MED ORDER — PROMETHAZINE HCL 25 MG/ML IJ SOLN
6.2500 mg | INTRAMUSCULAR | Status: DC | PRN
  Administered 2019-06-14: 6.25 mg via INTRAVENOUS

## 2019-06-14 MED ORDER — MIDAZOLAM HCL 2 MG/2ML IJ SOLN
INTRAMUSCULAR | Status: AC
  Filled 2019-06-14: qty 2

## 2019-06-14 MED ORDER — DEXAMETHASONE SODIUM PHOSPHATE 10 MG/ML IJ SOLN
INTRAMUSCULAR | Status: DC | PRN
  Administered 2019-06-14: 8 mg via INTRAVENOUS

## 2019-06-14 MED ORDER — LIDOCAINE 2% (20 MG/ML) 5 ML SYRINGE
INTRAMUSCULAR | Status: AC
  Filled 2019-06-14: qty 5

## 2019-06-14 MED ORDER — PROPOFOL 10 MG/ML IV BOLUS
INTRAVENOUS | Status: AC
  Filled 2019-06-14: qty 20

## 2019-06-14 MED ORDER — FENTANYL CITRATE (PF) 100 MCG/2ML IJ SOLN
INTRAMUSCULAR | Status: AC
  Filled 2019-06-14: qty 2

## 2019-06-14 MED ORDER — LIDOCAINE 2% (20 MG/ML) 5 ML SYRINGE
INTRAMUSCULAR | Status: DC | PRN
  Administered 2019-06-14: 60 mg via INTRAVENOUS

## 2019-06-14 MED ORDER — EPHEDRINE SULFATE-NACL 50-0.9 MG/10ML-% IV SOSY
PREFILLED_SYRINGE | INTRAVENOUS | Status: DC | PRN
  Administered 2019-06-14 (×3): 10 mg via INTRAVENOUS

## 2019-06-14 MED ORDER — ACETAMINOPHEN 500 MG PO TABS
1000.0000 mg | ORAL_TABLET | Freq: Once | ORAL | Status: AC
  Administered 2019-06-14: 1000 mg via ORAL
  Filled 2019-06-14: qty 2

## 2019-06-14 MED ORDER — HYDROMORPHONE HCL 1 MG/ML IJ SOLN
INTRAMUSCULAR | Status: AC
  Administered 2019-06-14: 0.5 mg via INTRAVENOUS
  Filled 2019-06-14: qty 1

## 2019-06-14 MED ORDER — PROCHLORPERAZINE MALEATE 10 MG PO TABS: 10.0000 mg | ORAL_TABLET | Freq: Four times a day (QID) | ORAL | 1 refills | Status: DC | PRN

## 2019-06-14 MED ORDER — LACTATED RINGERS IV SOLN
INTRAVENOUS | Status: DC
  Administered 2019-06-14: 07:00:00 via INTRAVENOUS
  Administered 2019-06-14: 1000 mL via INTRAVENOUS

## 2019-06-14 MED ORDER — PROMETHAZINE HCL 25 MG/ML IJ SOLN
INTRAMUSCULAR | Status: AC
  Filled 2019-06-14: qty 1

## 2019-06-14 MED ORDER — EPHEDRINE 5 MG/ML INJ
INTRAVENOUS | Status: AC
  Filled 2019-06-14: qty 10

## 2019-06-14 MED ORDER — MORPHINE SULFATE ER 15 MG PO TBCR: 15.0000 mg | EXTENDED_RELEASE_TABLET | Freq: Two times a day (BID) | ORAL | 0 refills | Status: DC

## 2019-06-14 MED ORDER — FENTANYL CITRATE (PF) 100 MCG/2ML IJ SOLN
INTRAMUSCULAR | Status: DC | PRN
  Administered 2019-06-14 (×4): 50 ug via INTRAVENOUS

## 2019-06-14 MED ORDER — ONDANSETRON HCL 4 MG/2ML IJ SOLN
INTRAMUSCULAR | Status: AC
  Filled 2019-06-14: qty 2

## 2019-06-14 MED ORDER — PROPOFOL 10 MG/ML IV BOLUS
INTRAVENOUS | Status: DC | PRN
  Administered 2019-06-14: 200 mg via INTRAVENOUS

## 2019-06-14 SURGICAL SUPPLY — 26 items
BAG URINE DRAINAGE (UROLOGICAL SUPPLIES) ×3 IMPLANT
CATH FOLEY 3WAY  5CC 16FR (CATHETERS) ×1
CATH FOLEY 3WAY 5CC 16FR (CATHETERS) ×2 IMPLANT
COVER WAND RF STERILE (DRAPES) IMPLANT
DRAPE SHEET LG 3/4 BI-LAMINATE (DRAPES) ×3 IMPLANT
DRSG PAD ABDOMINAL 8X10 ST (GAUZE/BANDAGES/DRESSINGS) ×6 IMPLANT
GAUZE 4X4 16PLY RFD (DISPOSABLE) ×3 IMPLANT
GLOVE BIO SURGEON STRL SZ 6 (GLOVE) ×6 IMPLANT
GLOVE BIO SURGEON STRL SZ7.5 (GLOVE) ×6 IMPLANT
GOWN STRL REUS W/TWL LRG LVL3 (GOWN DISPOSABLE) ×6 IMPLANT
HOVERMATT SINGLE USE (MISCELLANEOUS) ×3 IMPLANT
KIT BASIN OR (CUSTOM PROCEDURE TRAY) ×3 IMPLANT
KIT TURNOVER KIT A (KITS) IMPLANT
PACK LITHOTOMY IV (CUSTOM PROCEDURE TRAY) ×3 IMPLANT
PACKING VAGINAL (PACKING) ×3 IMPLANT
PLUG CATH AND CAP STER (CATHETERS) ×3 IMPLANT
SET IRRIG Y TYPE TUR BLADDER L (SET/KITS/TRAYS/PACK) ×3 IMPLANT
SURGILUBE 2OZ TUBE FLIPTOP (MISCELLANEOUS) ×3 IMPLANT
SUT PROLENE 2 0 CT2 30 (SUTURE) IMPLANT
SUT VIC AB 0 CT1 27 (SUTURE)
SUT VIC AB 0 CT1 27XBRD ANTBC (SUTURE) IMPLANT
SYR 10ML LL (SYRINGE) ×3 IMPLANT
TOWEL OR 17X26 10 PK STRL BLUE (TOWEL DISPOSABLE) ×3 IMPLANT
UNDERPAD 30X36 HEAVY ABSORB (UNDERPADS AND DIAPERS) ×6 IMPLANT
WATER STERILE IRR 500ML POUR (IV SOLUTION) ×3 IMPLANT
YANKAUER SUCT BULB TIP 10FT TU (MISCELLANEOUS) ×3 IMPLANT

## 2019-06-14 NOTE — Progress Notes (Signed)
  Radiation Oncology         (336) [phone] ________________________________  Name: [name] MRN: [id number]  Date: 06/14/2019  DOB: 1961-02-22  SIMULATION AND TREATMENT PLANNING NOTE HDR BRACHYTHERAPY  DIAGNOSIS:  Clinical stage I,grade 3 endometrial cancer (Inoperable)  NARRATIVE:  The patient was brought to the [place].  Identity was confirmed.  All relevant records and images related to the planned course of therapy were reviewed.  The patient freely provided informed written consent to proceed with treatment after reviewing the details related to the planned course of therapy. The consent form was witnessed and verified by the simulation staff.  Then, the patient was set-up in a stable reproducible  supine position for radiation therapy.  CT images were obtained.  Surface markings were placed.  The CT images were loaded into the planning software.  Then the target and avoidance structures were contoured.  Treatment planning then occurred.  The radiation prescription was entered and confirmed.   I have requested : Brachytherapy Isodose Plan and Dosimetry Calculations to plan the radiation distribution.    PLAN:  The patient will receive 8.5 Gy in 1 fraction directed at the high risk clinical target volume within the uterus.  Patient will be treated with iridium 192 with high-dose-rate source.  5 Heyman capsule applicators were placed within the endometrial cavity which will be used to deliver the treatment.    ________________________________  Blair Promise, PhD, MD   This document serves as a record of services personally performed by Gery Pray, MD. It was created on his behalf by Wilburn Mylar, a trained medical scribe. The creation of this record is based on the scribe's personal observations and the provider's statements to them. This document has been checked and approved by the attending provider.

## 2019-06-14 NOTE — Anesthesia Procedure Notes (Signed)
Procedure Name: LMA Insertion Date/Time: 06/14/2019 7:28 AM Performed by: Maxwell Caul, CRNA Pre-anesthesia Checklist: Patient identified, Emergency Drugs available, Suction available, Patient being monitored and Timeout performed Patient Re-evaluated:Patient Re-evaluated prior to induction Oxygen Delivery Method: Circle system utilized Preoxygenation: Pre-oxygenation with 100% oxygen LMA: LMA inserted LMA Size: 4.0 Number of attempts: 1 Placement Confirmation: positive ETCO2 and breath sounds checked- equal and bilateral Tube secured with: Tape Dental Injury: Teeth and Oropharynx as per pre-operative assessment  Comments: Very loose upper front tooth. LMA taped to right side of mouth away from loose tooth.

## 2019-06-14 NOTE — Interval H&P Note (Signed)
History and Physical Interval Note:  06/14/2019 7:08 AM  [name]  has presented today for surgery, with the diagnosis of ENDOMETRIAL CANCER.  The various methods of treatment have been discussed with the patient and family. After consideration of risks, benefits and other options for treatment, the patient has consented to  Procedure(s): [place] (N/A) OPERATIVE ULTRASOUND (N/A) as a surgical intervention.  The patient's history has been reviewed, patient examined, no change in status, stable for surgery.  I have reviewed the patient's chart and labs.  Questions were answered to the patient's satisfaction.     Gery Pray

## 2019-06-14 NOTE — Progress Notes (Signed)
Received pt from [place] PACU. Pt somnolent but easily aroused to voice. Pt aware of surroundings. Pt maintaining adequate oxygen saturations on supplemental oxygen via [place]. Pt able to take PO intake without difficulty. Pt tolerated treatment without difficulty. Foley catheter removed with good patient tolerance. PIV removed with tip intact. Heyman's capsules removed per Dr. Sondra Come with good patient tolerance.   Pt was discharged via WC to husband/personal vehicle without any signs of distress. Loma Sousa, RN BSN

## 2019-06-14 NOTE — Progress Notes (Signed)
  Radiation Oncology         (336) [phone] ________________________________  Name: [name] MRN: [id number]  Date: 06/14/2019  DOB: [birth year]  CC: Marton Redwood, MD  Marton Redwood, MD  HDR BRACHYTHERAPY NOTE  DIAGNOSIS: Clinical stage I,  grade 3 endometrial cancer (inoperable)  NARRATIVE: The patient was brought to the [place] suite. Identity was confirmed. All relevant records and images related to the planned course of therapy were reviewed. The patient freely provided informed written consent to proceed with treatment after reviewing the details related to the planned course of therapy. The consent form was witnessed and verified by the simulation staff. Then, the patient was set-up in a stable reproducible supine position for radiation therapy. The Heyman applicator catheters were accessed and fiducial markers were placed within the Heyman capsules.  A total of  5 Heyman applicator capsules were placed in the operating room and 5 were used to deliver the treatment.  Simple treatment device note: While in the operating room the patient had placement and positioning of the applicator  system. She will be treated with  5 applicators.  Verification simulation note: An AP and lateral film was obtained through the pelvis area. This was compared to the patient's planning films documenting accurate position of the applicator system for treatment.  High-dose-rate brachytherapy treatment note:  The remote afterloading device was accessed through catheter system and attached to the [facility] system. Patient then proceeded to undergo her second high-dose-rate treatment directed at the uterus. The patient was prescribed a dose of 8.5 gray to be delivered to the high risk clinical target volume (HRCTV).. Patient was treated with 5 channels using 29 dwell positions. Treatment time was 685.9 seconds. The patient tolerated the procedure well. After completion of her therapy, a radiation survey was  performed documenting return of the iridium source into the [employer] safe. The patient was then transferred to the nursing suite. She then had removal of the 5 Heyman capsule applicators without difficulty.  Minimal bleeding occurred.  PLAN: Patient has completed her high-dose-rate treatment. She will return for follow up in approximately one month. ________________________________  Blair Promise, PhD, MD  This document serves as a record of services personally performed by Gery Pray, MD. It was created on his behalf by Wilburn Mylar, a trained medical scribe. The creation of this record is based on the scribe's personal observations and the provider's statements to them. This document has been checked and approved by the attending provider.

## 2019-06-14 NOTE — Anesthesia Postprocedure Evaluation (Signed)
Anesthesia Post Note  Patient: [name]  Procedure(s) Performed: HYMEN CAPSULE PLACEMENT (N/A Vagina ) OPERATIVE ULTRASOUND (N/A Abdomen)     Patient location during evaluation: PACU Anesthesia Type: General Level of consciousness: awake and alert Pain management: pain level controlled Vital Signs Assessment: post-procedure vital signs reviewed and stable Respiratory status: spontaneous breathing, nonlabored ventilation, respiratory function stable and patient connected to nasal cannula oxygen Cardiovascular status: blood pressure returned to baseline and stable Postop Assessment: no apparent nausea or vomiting Anesthetic complications: no    Last Vitals:  Vitals:   06/14/19 0900 06/14/19 0930  BP: 138/61   Pulse: 93   Resp: 16   Temp:  36.7 C  SpO2: 94%     Last Pain:  Vitals:   06/14/19 0930  TempSrc:   PainSc: Asleep                 Abrianna Sidman P Navjot Pilgrim

## 2019-06-14 NOTE — Op Note (Signed)
PATIENT: [name] DATE: 06/14/19   Preop Diagnosis: grade 3 endometrial cavity, non-operative candidate  Postoperative Diagnosis: same  Surgery: placement of Heyman capsules (brachytherapy device)  Surgeons:  Donaciano Eva, MD Co-[place]: Dr Gery Pray  Anesthesia: General   Estimated blood loss: <53ml  IVF:  258ml   Urine output: not recorded as bladder backfilled  Complications: None   Pathology: endometrial curettings  Operative findings: grossly normal appearing cervix flush with vagina. Uterus sounded to 6cm, no intrauterine mass visualized on imaging.   Procedure: The patient was identified in the preoperative holding area. Informed consent was signed on the chart. Patient was seen history was reviewed and exam was performed.   The patient was then taken to the operating room and placed in the supine position with SCD hose on. General anesthesia was then induced without difficulty. She was then placed in the dorsolithotomy position. The perineum was prepped with Betadine. The vagina was prepped with Betadine. The patient was then draped after the prep was dried. A foley catheter was placed to empty the bladder was performed under aseptic conditions. This catheter was backfilled with 200cc of sterile saline.  The [place] was used to visualize the uterus in a saggital plane.  Timeout was performed the patient, procedure, antibiotic, allergy, and length of procedure.   The speculum was placed in the vagina. The single tooth tenaculum was placed on the anterior lip of the cervix. The uterine sound was placed in the cervix and advanced to the fundus to 6cm under US guidance. The cervix was successively dilated using pratts dilators to #27. US guidance was performed throughout.  The Heyman's capsules (total of 5) were inserted into the uterine cavity under US guidance until no further capsules could be accommodated.   The tenaculum was removed and hemostasis  was observed.   Dr Sondra Come completed the procedure with labeling and securing of catheters.   All instrument, suture, laparotomy, Ray-Tec, and needle counts were correct x2. The patient tolerated the procedure well and was taken recovery room in stable condition. This is [name] dictating an operative note on [name].

## 2019-06-14 NOTE — Brief Op Note (Signed)
06/14/2019  9:02 AM  PATIENT:  [name]  58 y.o. female  PRE-OPERATIVE DIAGNOSIS:  ENDOMETRIAL CANCER  POST-OPERATIVE DIAGNOSIS:  ENDOMETRIAL CANCER  PROCEDURE:  Procedure(s): HYMEN CAPSULE PLACEMENT (N/A) OPERATIVE ULTRASOUND (N/A)  SURGEON:  Surgeon(s) and Role:    * Gery Pray, MD - Primary    * [name], MD - Assisting  PHYSICIAN ASSISTANT:   ASSISTANTS: none   ANESTHESIA:   general  EBL:  < 5 cc  BLOOD ADMINISTERED:none  DRAINS: Urinary Catheter (Foley)   LOCAL MEDICATIONS USED:  NONE  SPECIMEN:  No Specimen  DISPOSITION OF SPECIMEN:  N/A  COUNTS:  YES  TOURNIQUET:  * No tourniquets in log *  DICTATION: The patient was identified in the preoperative holding area. Informed consent was signed on the chart. Patient was seen,  history was reviewed and exam was performed. Timeout was performed the patient, procedure, antibiotic, allergy, and length of procedure. The patient was then taken to the operating room and placed in the supine position with SCD hose on. General anesthesia was then induced without difficulty. She was then placed in the dorsolithotomy position. The perineum was prepped with Betadine. The vagina was prepped with Betadine. The patient was then draped after the prep was dried. Afoley catheter was placedto empty the bladder was performed under asepticconditions. This catheter was backfilled with 200cc of sterile saline. The [place] was used to visualize the uterus in a saggital plane. The speculum was placed in the vagina.The single tooth tenaculum was placed on the anterior lip of the cervix by Dr. [name].The uterine sound was placed in the cervixand advanced to the fundus to 6cm under US guidance.The cervix was successively dilated using pratts dilators to #27 by Dr. [name]. US guidance was performed throughout. Excellent Ultrasound images were obtained after adequate bladder filling. The Heyman's capsules (total of 5) were inserted into the  uterine cavity under US guidance until no further capsules could be accommodated. I completed the procedure with labeling and securing of catheters.  Later in the day the patient will be transferred to radiation oncology for planning and treatment for her second brachytherapy procedure with iridium 192 as the high-dose-rate source.    This will complete her definitive course of radiation therapy inoperable endometrial cancer.  PLAN OF CARE: Transferred to radiation oncology for planning and treatment  PATIENT DISPOSITION:  PACU - hemodynamically stable.   Delay start of Pharmacological VTE agent (>24hrs) due to surgical blood loss or risk of bleeding: not applicable

## 2019-06-14 NOTE — Transfer of Care (Signed)
Immediate Anesthesia Transfer of Care Note  Patient: [name]  Procedure(s) Performed: HYMEN CAPSULE PLACEMENT (N/A Vagina ) OPERATIVE ULTRASOUND (N/A Abdomen)  Patient Location: PACU  Anesthesia Type:General  Level of Consciousness: awake, alert  and oriented  Airway & Oxygen Therapy: Patient Spontanous Breathing and Patient connected to face mask oxygen  Post-op Assessment: Report given to RN and Post -op Vital signs reviewed and stable  Post vital signs: Reviewed and stable  Last Vitals:  Vitals Value Taken Time  BP    Temp    Pulse    Resp    SpO2      Last Pain:  Vitals:   06/14/19 0619  TempSrc: Oral         Complications: No apparent anesthesia complications

## 2019-06-15 ENCOUNTER — Encounter (HOSPITAL_COMMUNITY): Payer: Self-pay | Admitting: Radiation Oncology

## 2019-07-06 ENCOUNTER — Telehealth: Payer: Self-pay | Admitting: Oncology

## 2019-07-06 ENCOUNTER — Other Ambulatory Visit: Payer: Self-pay | Admitting: Pharmacist

## 2019-07-06 NOTE — Telephone Encounter (Signed)
Patient called and asked if she has an apt scheduled with Dr. Sondra Come.  Notified her of apt on 07/12/19 at 11 am.

## 2019-07-07 ENCOUNTER — Other Ambulatory Visit (HOSPITAL_COMMUNITY): Payer: Self-pay | Admitting: Psychiatry

## 2019-07-07 DIAGNOSIS — F331 Major depressive disorder, recurrent, moderate: Secondary | ICD-10-CM

## 2019-07-07 NOTE — Patient Outreach (Signed)
[facility]) Care Management  07/07/2019  [name] 1960/12/08 [id number]   Received a phone call from Gregory, [name] stating the patient was on the diabetes adherence report for [facility]. Metformin had not been filled since June of 2020.  Patient was called HIPAA identifiers were obtained. When asked how she was feeling, she said ok but was concerned about the state of her endometrial cancer. (She has an appointment with Oncology next week).   Patient said she recently started back taking Metformin. She said she stopped taking metformin because of the increased nausea and vomiting when she was actively in her cancer treatment.  When asked how many tablets she had left, patient said she had about half a bottle. She agreed to allow me to call her pharmacy and request a refill.  [facility] was called and the metformin was filled. Patient said she would pick it up next week.  Plan: Call patient back next week to follow up.  Elayne Guerin, PharmD, [place] Clinical Pharmacist [phone]

## 2019-07-10 ENCOUNTER — Ambulatory Visit: Payer: Self-pay | Admitting: Pharmacist

## 2019-07-11 ENCOUNTER — Ambulatory Visit (HOSPITAL_COMMUNITY): Payer: HMO | Admitting: Psychiatry

## 2019-07-12 ENCOUNTER — Telehealth: Payer: Self-pay | Admitting: *Deleted

## 2019-07-12 ENCOUNTER — Encounter (HOSPITAL_COMMUNITY): Payer: Self-pay | Admitting: Psychiatry

## 2019-07-12 ENCOUNTER — Ambulatory Visit: Payer: HMO | Admitting: Radiation Oncology

## 2019-07-12 ENCOUNTER — Telehealth: Payer: Self-pay | Admitting: Oncology

## 2019-07-12 ENCOUNTER — Other Ambulatory Visit: Payer: Self-pay

## 2019-07-12 ENCOUNTER — Ambulatory Visit (INDEPENDENT_AMBULATORY_CARE_PROVIDER_SITE_OTHER): Payer: HMO | Admitting: Psychiatry

## 2019-07-12 DIAGNOSIS — F411 Generalized anxiety disorder: Secondary | ICD-10-CM | POA: Diagnosis not present

## 2019-07-12 DIAGNOSIS — F4310 Post-traumatic stress disorder, unspecified: Secondary | ICD-10-CM | POA: Diagnosis not present

## 2019-07-12 DIAGNOSIS — F331 Major depressive disorder, recurrent, moderate: Secondary | ICD-10-CM

## 2019-07-12 MED ORDER — LAMOTRIGINE 150 MG PO TABS: 150.0000 mg | ORAL_TABLET | Freq: Two times a day (BID) | ORAL | 0 refills | Status: DC

## 2019-07-12 MED ORDER — CLONAZEPAM 1 MG PO TABS: ORAL_TABLET | ORAL | 2 refills | Status: DC

## 2019-07-12 MED ORDER — FLUOXETINE HCL 40 MG PO CAPS: 40.0000 mg | ORAL_CAPSULE | Freq: Every day | ORAL | 0 refills | Status: DC

## 2019-07-12 MED ORDER — TRAZODONE HCL 300 MG PO TABS: 300.0000 mg | ORAL_TABLET | Freq: Every day | ORAL | 0 refills | Status: DC

## 2019-07-12 MED ORDER — PRAZOSIN HCL 5 MG PO CAPS: 5.0000 mg | ORAL_CAPSULE | Freq: Every day | ORAL | 0 refills | Status: DC

## 2019-07-12 NOTE — Telephone Encounter (Signed)
RETURNED PATIENT'S PHONE CALL, SPOKE WITH PATIENT. ?
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
## 2019-07-12 NOTE — Telephone Encounter (Signed)
[name] called and wants to cancel her follow up appointment today with Dr. Sondra Come because she is not feeling well.  Transferred her to Romie Jumper, Medical Secretary to reschedule.

## 2019-07-13 ENCOUNTER — Other Ambulatory Visit: Payer: Self-pay | Admitting: Pharmacist

## 2019-07-13 ENCOUNTER — Ambulatory Visit: Payer: Self-pay | Admitting: Pharmacist

## 2019-07-13 NOTE — Patient Outreach (Signed)
[place] [facility]) Care Management  07/13/2019  KENYLA LAMASTUS 1961/04/03 [id number]   Patient was called to follow up on medication management and metformin adherence. Unfortunately, she did not answer her phone. HIPAA compliant message was left on her voicemail.  Plan: Call the patient back in 4-6 weeks. Send unsuccessful outreach letter   Elayne Guerin, PharmD, [place] Clinical Pharmacist [phone]

## 2019-07-26 ENCOUNTER — Ambulatory Visit
Admission: RE | Admit: 2019-07-26 | Discharge: 2019-07-26 | Disposition: A | Discharge status: Home / Self Care | Payer: HMO | Source: Ambulatory Visit | Attending: Radiation Oncology | Admitting: Radiation Oncology

## 2019-07-26 NOTE — Progress Notes (Incomplete)
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 In the beginning of treatments, she reported stabbing pain in her vagina and was prescribed Furosimide, which provided great relief. She also experienced some urinary incontinence. She reported moderate to severe fatigue, diarrhea, dysuria, some urgency, vaginal discharge (clear to brown in color and very odorous), and vaginal spotting throughout treatment. She denied hematuria and rectal bleeding. She denied using imodium for the diarrhea.  During her HDR treatments, she experienced some minimal bleeding with insertion/removal of the Heyman capsule, but otherwise tolerated treatment relatively well.  Plan: The patient will follow-up with radiation oncology in 1 month.  ________________________________________________   Blair Promise, PhD, MD  This document serves as a record of services personally performed by Gery Pray, MD. It was created on his behalf by Wilburn Mylar, a trained medical scribe. The creation of this record is based on the scribe's  personal observations and the provider's statements to them. This document has been checked and approved by the attending provider.

## 2019-08-14 DIAGNOSIS — Z20818 Contact with and (suspected) exposure to other bacterial communicable diseases: Secondary | ICD-10-CM | POA: Diagnosis not present

## 2019-08-14 DIAGNOSIS — J449 Chronic obstructive pulmonary disease, unspecified: Secondary | ICD-10-CM | POA: Diagnosis not present

## 2019-08-14 DIAGNOSIS — J069 Acute upper respiratory infection, unspecified: Secondary | ICD-10-CM | POA: Diagnosis not present

## 2019-08-14 DIAGNOSIS — F17200 Nicotine dependence, unspecified, uncomplicated: Secondary | ICD-10-CM | POA: Diagnosis not present

## 2019-08-14 DIAGNOSIS — R110 Nausea: Secondary | ICD-10-CM | POA: Diagnosis not present

## 2019-08-14 DIAGNOSIS — I10 Essential (primary) hypertension: Secondary | ICD-10-CM | POA: Diagnosis not present

## 2019-08-14 DIAGNOSIS — R05 Cough: Secondary | ICD-10-CM | POA: Diagnosis not present

## 2019-08-20 ENCOUNTER — Other Ambulatory Visit (HOSPITAL_COMMUNITY): Payer: Self-pay | Admitting: Psychiatry

## 2019-08-20 DIAGNOSIS — F331 Major depressive disorder, recurrent, moderate: Secondary | ICD-10-CM

## 2019-08-24 ENCOUNTER — Ambulatory Visit: Payer: HMO | Admitting: Pharmacist

## 2019-09-03 ENCOUNTER — Other Ambulatory Visit: Payer: Self-pay | Admitting: Pharmacist

## 2019-09-03 ENCOUNTER — Ambulatory Visit: Payer: Self-pay | Admitting: Pharmacist

## 2019-09-03 NOTE — Patient Outreach (Signed)
[facility]) Care Management  09/03/2019  [name] 1960/11/03 [id number]   Patient was called to follow up on medication adherence and CSNP med review. Unfortunately, she did not answer her phone. HIPAA compliant message was left on her voicemail.  Plan: Call patient back in 3 months.  Elayne Guerin, PharmD, [place] Clinical Pharmacist [phone]

## 2019-09-18 ENCOUNTER — Other Ambulatory Visit (HOSPITAL_COMMUNITY): Payer: Self-pay | Admitting: Psychiatry

## 2019-09-18 DIAGNOSIS — F331 Major depressive disorder, recurrent, moderate: Secondary | ICD-10-CM

## 2019-10-05 ENCOUNTER — Other Ambulatory Visit (HOSPITAL_COMMUNITY): Payer: Self-pay | Admitting: Psychiatry

## 2019-10-05 DIAGNOSIS — F411 Generalized anxiety disorder: Secondary | ICD-10-CM

## 2019-10-05 DIAGNOSIS — F4310 Post-traumatic stress disorder, unspecified: Secondary | ICD-10-CM

## 2019-10-05 DIAGNOSIS — F331 Major depressive disorder, recurrent, moderate: Secondary | ICD-10-CM

## 2019-10-08 ENCOUNTER — Encounter (HOSPITAL_COMMUNITY): Payer: Self-pay | Admitting: Psychiatry

## 2019-10-08 ENCOUNTER — Ambulatory Visit (INDEPENDENT_AMBULATORY_CARE_PROVIDER_SITE_OTHER): Payer: HMO | Admitting: Psychiatry

## 2019-10-08 ENCOUNTER — Other Ambulatory Visit: Payer: Self-pay

## 2019-10-08 DIAGNOSIS — F331 Major depressive disorder, recurrent, moderate: Secondary | ICD-10-CM

## 2019-10-08 DIAGNOSIS — F339 Major depressive disorder, recurrent, unspecified: Secondary | ICD-10-CM | POA: Diagnosis not present

## 2019-10-08 DIAGNOSIS — F411 Generalized anxiety disorder: Secondary | ICD-10-CM

## 2019-10-08 DIAGNOSIS — F4310 Post-traumatic stress disorder, unspecified: Secondary | ICD-10-CM

## 2019-10-08 MED ORDER — PRAZOSIN HCL 5 MG PO CAPS: 5.0000 mg | ORAL_CAPSULE | Freq: Every day | ORAL | 0 refills | Status: DC

## 2019-10-08 MED ORDER — TRAZODONE HCL 300 MG PO TABS: 300.0000 mg | ORAL_TABLET | Freq: Every day | ORAL | 0 refills | Status: DC

## 2019-10-08 MED ORDER — FLUOXETINE HCL 40 MG PO CAPS: 40.0000 mg | ORAL_CAPSULE | Freq: Every day | ORAL | 0 refills | Status: DC

## 2019-10-08 MED ORDER — CLONAZEPAM 1 MG PO TABS: ORAL_TABLET | ORAL | 1 refills | Status: DC

## 2019-10-08 MED ORDER — LAMOTRIGINE 150 MG PO TABS: 150.0000 mg | ORAL_TABLET | Freq: Two times a day (BID) | ORAL | 0 refills | Status: DC

## 2019-10-08 MED ORDER — REXULTI 0.5 MG PO TABS: 0.5000 mg | ORAL_TABLET | Freq: Every day | ORAL | 1 refills | Status: DC
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
## 2019-10-22 ENCOUNTER — Telehealth: Payer: Self-pay | Admitting: *Deleted

## 2019-10-22 NOTE — Telephone Encounter (Signed)
CALLED PATIENT TO ASK ABOUT COMING IN FOR A FU, PATIENT AGREED TO COME ON 11-05-19 @ 2:30 PM

## 2019-10-29 ENCOUNTER — Other Ambulatory Visit: Payer: Self-pay

## 2019-10-29 NOTE — Patient Outreach (Signed)
[facility]) Care Management Chronic Special Needs Program  10/29/2019  Name: [name] DOB: July 29, 1961  MRN: [id number]  Ms. [name] is enrolled in a chronic special needs plan for Diabetes. Chronic Care Management Coordinator telephoned client to review health risk assessment and to develop individualized care plan.  Reviewed the chronic care management program, importance of client participation, and taking their care plan to all provider appointments and inpatient facilities.  Reviewed the transition of care process and possible referral to community care management.  Subjective: Client states that she is slowly recovering from her radiation treatment.  States she is to see her oncologist next week.  States she has been eating less and she has lost weight.  States she has not been checking her blood sugars but she has a glucometer.  States she usually only eats one meal a day for dinner but she does drink Ginger Ale about once a day.  States she would like to get off of her metformin.  Denies any difficulty affording her medications.  Declines social work referral and states she does not wish to complete her Advanced Directives at this time  Goals Addressed            This Visit's Progress   . COMPLETED: Advanced Care Planning complete by next 9 months(continue 06/05/19)   Not on track    Review Advanced Directives forms Declines social work referral  Does not wish to complete and no longer goal    . Client understands the importance of follow-up with providers by attending scheduled visits   On track    Plan to keep scheduled appointments with providers    . COMPLETED: Client will report abillity to obtain Medications within next 6 months       Pharmacy reviewed medicatons    . Client will use Assistive Devices as needed and verbalize understanding of device use   On track    Check blood sugars daily either fasting or 1 1/2 hours after eating with  goal of 80-130 fasting and 180 or less after meals Reviewed use of glucometer    . Client will verbalize knowledge of chronic lung disease as evidenced by no ED visits or Inpatient stays related to chronic lung disease    On track    Reviewed signs and symptoms of COPD to call MD    . Client will verbalize knowledge of self management of Hypertension as evidences by BP reading of 140/90 or less; or as defined by provider   On track    Plan to check B/P regularly Take B/P medications as ordered Plan to follow a low salt diet  Increase activity as tolerated Reviewed: lifestyle modification - low sodium diet,continue smoking cessation, weight control. medication compliance, exercise and stress    . Diabetes Patient stated goal to no longer need diabetes medicaton in the next 12 months (pt-stated)       Reviewed diabetes self management actions to lower Hemoglobin A1C  Reviewed approximate amount of CHOs to aim for at meals ( 30-45 gm ) and snacks (15 gms) Plan to limit sugar sweetened drinks and drink more water Sent EMMI:Diabetes:How to check your blood sugar    . HEMOGLOBIN A1C < 7 (pt-stated)       Last Hemoglobin A1C 6.8% on 05/18/19 Diabetes self management actions:  Glucose monitoring per provider recommendations  Eat Healthy  Check feet daily  Visit provider every 3-6 months as directed  Hbg A1C level every 3-6  months.  Eye Exam yearly    . Maintain timely refills of diabetic medication as prescribed within the year .   On track    Plan to discuss with provider about changing Metformin to extended release Client now getting refills of medications Reinforced importance of getting medications refilled    . Obtain annual  Lipid Profile, LDL-C   On track    Completed 05/07/19 LDL 47 The goal for LDL is less than 70 mg/dL as you are at high risk for complications    . Obtain Annual Eye (retinal)  Exam    Not on track    Reinforced importance of having annual dilated eye  exam    . Obtain Annual Foot Exam   On track    Check your skin and feet every day for cuts, bruises, redness, blisters, or sores. Schedule a foot exam with your health care provider once every year    . Obtain annual screen for micro albuminuria (urine) , nephropathy (kidney problems)   On track    It is important for your doctor to check your urine for protein at least every year    . Obtain Hemoglobin A1C at least 2 times per year   On track    Completed 11/01/18, 05/07/19 It is important to have your Hemoglobin A1C checked every 6 months if you are at goal and every 3 months if you are not at goal    . Visit Primary Care Provider or Endocrinologist at least 2 times per year    On track    Completed 05/10/19 Please schedule your annual wellness visit     Client is  meeting diabetes self management goal of hemoglobin A1C of <7% with last reading of 6.8% Client is also followed by [facility] for polypharmacy and medication adherence Client past due for dilated eye exam and encouraged to get exam Client declines social work referral to assist with Advanced Directives and states does not wish to complete at this time Reviewed number for 24-hour nurse Line Reviewed COVID 19 precautions Encouraged to sign up for COVID vaccination when possible  Plan:  Send successful outreach letter with a copy of their individualized care plan, Send individual care plan to provider and Send educational material-[employer](HTA) calendar, EMMI: Diabetes How to check your blood sugar  Chronic care management coordination will outreach in:  6 Months   Peter Garter RN, [facility], [place], [place] Management Coordinator [place] [facility] [phone]

## 2019-11-05 ENCOUNTER — Telehealth: Payer: Self-pay | Admitting: *Deleted

## 2019-11-05 ENCOUNTER — Ambulatory Visit: Payer: HMO | Admitting: Radiation Oncology

## 2019-11-05 NOTE — Telephone Encounter (Signed)
CALLED PATIENT TO ASK ABOUT ALTERING FU ON 11-15-19, PATIENT AGREED TO COME IN FOR FU ON 11-22-19 @ 1 PM

## 2019-11-07 DIAGNOSIS — I700 Atherosclerosis of aorta: Secondary | ICD-10-CM | POA: Diagnosis not present

## 2019-11-07 DIAGNOSIS — C541 Malignant neoplasm of endometrium: Secondary | ICD-10-CM | POA: Diagnosis not present

## 2019-11-07 DIAGNOSIS — M797 Fibromyalgia: Secondary | ICD-10-CM | POA: Diagnosis not present

## 2019-11-07 DIAGNOSIS — I10 Essential (primary) hypertension: Secondary | ICD-10-CM | POA: Diagnosis not present

## 2019-11-07 DIAGNOSIS — J449 Chronic obstructive pulmonary disease, unspecified: Secondary | ICD-10-CM | POA: Diagnosis not present

## 2019-11-07 DIAGNOSIS — E785 Hyperlipidemia, unspecified: Secondary | ICD-10-CM | POA: Diagnosis not present

## 2019-11-07 DIAGNOSIS — E1169 Type 2 diabetes mellitus with other specified complication: Secondary | ICD-10-CM | POA: Diagnosis not present

## 2019-11-07 DIAGNOSIS — F319 Bipolar disorder, unspecified: Secondary | ICD-10-CM | POA: Diagnosis not present

## 2019-11-07 DIAGNOSIS — Z1331 Encounter for screening for depression: Secondary | ICD-10-CM | POA: Diagnosis not present

## 2019-11-15 ENCOUNTER — Ambulatory Visit: Payer: HMO | Admitting: Radiation Oncology

## 2019-11-19 ENCOUNTER — Other Ambulatory Visit: Payer: Self-pay

## 2019-11-19 ENCOUNTER — Encounter (HOSPITAL_COMMUNITY): Payer: Self-pay | Admitting: Psychiatry

## 2019-11-19 ENCOUNTER — Ambulatory Visit (INDEPENDENT_AMBULATORY_CARE_PROVIDER_SITE_OTHER): Payer: HMO | Admitting: Psychiatry

## 2019-11-19 DIAGNOSIS — F4310 Post-traumatic stress disorder, unspecified: Secondary | ICD-10-CM

## 2019-11-19 DIAGNOSIS — F411 Generalized anxiety disorder: Secondary | ICD-10-CM | POA: Diagnosis not present

## 2019-11-19 DIAGNOSIS — F331 Major depressive disorder, recurrent, moderate: Secondary | ICD-10-CM | POA: Diagnosis not present

## 2019-11-19 MED ORDER — ARIPIPRAZOLE 2 MG PO TABS: 2.0000 mg | ORAL_TABLET | Freq: Every day | ORAL | 0 refills | Status: DC

## 2019-11-19 MED ORDER — CLONAZEPAM 1 MG PO TABS: ORAL_TABLET | ORAL | 0 refills | Status: DC
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
## 2019-11-22 ENCOUNTER — Other Ambulatory Visit: Payer: Self-pay

## 2019-11-22 ENCOUNTER — Ambulatory Visit
Admission: RE | Admit: 2019-11-22 | Discharge: 2019-11-22 | Disposition: A | Discharge status: Home / Self Care | Payer: HMO | Source: Ambulatory Visit | Attending: Radiation Oncology | Admitting: Radiation Oncology

## 2019-11-22 ENCOUNTER — Encounter: Payer: Self-pay | Admitting: Radiation Oncology

## 2019-11-22 VITALS — BP 135/68 | HR 88 | Temp 98.3°F | Resp 20 | Wt 203.4 lb

## 2019-11-22 DIAGNOSIS — C541 Malignant neoplasm of endometrium: Secondary | ICD-10-CM | POA: Insufficient documentation

## 2019-11-22 DIAGNOSIS — Z08 Encounter for follow-up examination after completed treatment for malignant neoplasm: Secondary | ICD-10-CM | POA: Diagnosis not present

## 2019-11-22 DIAGNOSIS — R300 Dysuria: Secondary | ICD-10-CM

## 2019-11-22 DIAGNOSIS — R3989 Other symptoms and signs involving the genitourinary system: Secondary | ICD-10-CM | POA: Diagnosis not present

## 2019-11-22 DIAGNOSIS — Z8542 Personal history of malignant neoplasm of other parts of uterus: Secondary | ICD-10-CM | POA: Diagnosis not present

## 2019-11-22 LAB — CMP (CANCER CENTER ONLY)
ALT: <6 U/L (ref 0–44)
AST: 7 U/L — ABNORMAL LOW (ref 15–41)
Albumin: 3.3 g/dL — ABNORMAL LOW (ref 3.5–5.0)
Alkaline Phosphatase: 92 U/L (ref 38–126)
Anion gap: 8 (ref 5–15)
BUN: 18 mg/dL (ref 6–20)
CO2: 27 mmol/L (ref 22–32)
Calcium: 9 mg/dL (ref 8.9–10.3)
Chloride: 108 mmol/L (ref 98–111)
Creatinine: 0.79 mg/dL (ref 0.44–1.00)
GFR, Est AFR Am: >60 mL/min (ref >60)
GFR, Estimated: >60 mL/min (ref >60)
Glucose, Bld: 86 mg/dL (ref 70–99)
Potassium: 4 mmol/L (ref 3.5–5.1)
Sodium: 143 mmol/L (ref 135–145)
Total Bilirubin: <0.2 mg/dL — ABNORMAL LOW (ref 0.3–1.2)
Total Protein: 6.9 g/dL (ref 6.5–8.1)

## 2019-11-22 LAB — CBC WITH DIFFERENTIAL (CANCER CENTER ONLY)
Abs Immature Granulocytes: 0.04 10*3/uL (ref 0.00–0.07)
Basophils Absolute: 0 10*3/uL (ref 0.0–0.1)
Basophils Relative: 1 %
Eosinophils Absolute: 0.1 10*3/uL (ref 0.0–0.5)
Eosinophils Relative: 2 %
HCT: 37.6 % (ref 36.0–46.0)
Hemoglobin: 11.6 g/dL — ABNORMAL LOW (ref 12.0–15.0)
Immature Granulocytes: 1 %
Lymphocytes Relative: 12 %
Lymphs Abs: 0.7 10*3/uL (ref 0.7–4.0)
MCH: 27.4 pg (ref 26.0–34.0)
MCHC: 30.9 g/dL (ref 30.0–36.0)
MCV: 88.7 fL (ref 80.0–100.0)
Monocytes Absolute: 0.5 10*3/uL (ref 0.1–1.0)
Monocytes Relative: 9 %
Neutro Abs: 4.1 10*3/uL (ref 1.7–7.7)
Neutrophils Relative %: 75 %
Platelet Count: 269 10*3/uL (ref 150–400)
RBC: 4.24 MIL/uL (ref 3.87–5.11)
RDW: 15.6 % — ABNORMAL HIGH (ref 11.5–15.5)
WBC Count: 5.5 10*3/uL (ref 4.0–10.5)
nRBC: 0 % (ref 0.0–0.2)

## 2019-11-22 LAB — URINALYSIS, COMPLETE (UACMP) WITH MICROSCOPIC
Bacteria, UA: NONE SEEN
Bilirubin Urine: NEGATIVE
Glucose, UA: NEGATIVE mg/dL
Ketones, ur: NEGATIVE mg/dL
Leukocytes,Ua: NEGATIVE
Nitrite: NEGATIVE
Protein, ur: NEGATIVE mg/dL
Specific Gravity, Urine: 1.008 (ref 1.005–1.030)
pH: 5 (ref 5.0–8.0)

## 2019-11-22 NOTE — Progress Notes (Signed)
Radiation Oncology         [phone] ________________________________  Name: [name] MRN: [id number]  Date: 11/22/2019  DOB: 1961-09-15  Follow-Up Visit Note  CC: Marton Redwood, MD  [name], MD    ICD-10-CM   1. Endometrial cancer ([place])  C54.1     Diagnosis:   Clinical stage I endometrial cancer,grade 3 (inoperable)  Interval Since Last Radiation:  5 months, 1 week 04/02/2019 through 05/14/2019 Site Technique Total Dose Dose per Fx Completed Fx Beam Energies  Pelvis: Uterus_pelvis 3D 45/45 1.8 25/25 15X   05/24/2019, 06/14/2019 Site Technique Total Dose Dose per Fx Completed Fx Total Capsules  Pelvis:  HRCTV HDR, [place] capsules 17/17 8.5 2/2 5    Narrative:  The patient returns today for routine follow-up. She has not seen by me or GYN oncology since treatment completion.  She missed her 1 month follow-up appointment and therefore was not scheduled for gynecologic oncology or radiation oncology 3 months later.  On review of systems, she reports pain in vaginal area, rated 4/10. Pt reports "a lot of pressure" and frequency of urine. Pt denies UTI. Pt reports burning with urination. Denies hematuria. Pt reports occasional vaginal spotting and a "creamy-like discharge" with "a little" odor. Pt denies rectal bleeding, diarrhea/constipation. Pt reports ongoing "nausea all the time but I've been like that for years". Denies abdominal bloating, vomiting.  ALLERGIES:  is allergic to tramadol.  Meds: Current Outpatient Medications  Medication Sig Dispense Refill  . ARIPiprazole (ABILIFY) 2 MG tablet Take 1 tablet (2 mg total) by mouth daily. 30 tablet 0  . atorvastatin (LIPITOR) 40 MG tablet Take 40 mg by mouth at bedtime.     . clonazePAM (KLONOPIN) 1 MG tablet Take 1 tab twice daily 60 tablet 0  . dicyclomine (BENTYL) 10 MG capsule Take 10 mg by mouth every 8 (eight) hours as needed for spasms (abdominal spasms).     Marland Kitchen FLUoxetine (PROZAC) 40 MG capsule Take 1  capsule (40 mg total) by mouth daily. 90 capsule 0  . lamoTRIgine (LAMICTAL) 150 MG tablet Take 1 tablet (150 mg total) by mouth 2 (two) times daily. 180 tablet 0  . lisinopril (PRINIVIL,ZESTRIL) 20 MG tablet Take 20 mg by mouth daily.   3  . metFORMIN (GLUCOPHAGE) 1000 MG tablet Take 1,000 mg by mouth 2 (two) times daily with a meal.     . omeprazole (PRILOSEC) 40 MG capsule Take 40 mg by mouth daily before breakfast.   6  . ONETOUCH VERIO test strip CHECK SUGARS TWICE A DAY DX  DM2, INSULIN DEPENDANT    . prazosin (MINIPRESS) 5 MG capsule Take 1 capsule (5 mg total) by mouth at bedtime. 90 capsule 0  . prochlorperazine (COMPAZINE) 10 MG tablet Take 1 tablet (10 mg total) by mouth every 6 (six) hours as needed for nausea or vomiting. 30 tablet 1  . trazodone (DESYREL) 300 MG tablet Take 1 tablet (300 mg total) by mouth at bedtime. 90 tablet 0  . VENTOLIN HFA 108 (90 BASE) MCG/ACT inhaler Inhale 2 puffs into the lungs every 6 (six) hours as needed for wheezing or shortness of breath.     . morphine (MS CONTIN) 15 MG 12 hr tablet Take 1 tablet (15 mg total) by mouth every 12 (twelve) hours. (Patient not taking: Reported on 11/22/2019) 60 tablet 0  . traMADol (ULTRAM) 50 MG tablet Take 100 mg by mouth as needed. Patient states she takes two pills every 8 hours as  needed for pain.  2   No current facility-administered medications for this encounter.    Physical Findings: The patient is in no acute distress. Patient is alert and oriented.  weight is 203 lb 6.4 oz (92.3 kg). Her temperature is 98.3 F (36.8 C). Her blood pressure is 135/68 and her pulse is 88. Her respiration is 20 and oxygen saturation is 96%. .  No significant changes. Lungs are clear to auscultation bilaterally. Heart has regular rate and rhythm. No palpable cervical, supraclavicular, or axillary adenopathy. Abdomen soft, non-tender, normal bowel sounds. On pelvic examination the external genitalia were unremarkable. A speculum exam  was performed. There are no mucosal lesions noted in the vaginal vault.  Vaginal vault is somewhat narrowed but does permit a small speculum.  The cervical os is visible without lesion.  Mild radiation changes along the cervix.  on bimanual and rectovaginal examination there were no pelvic masses appreciated.  Unable to reach the uterus on bimanual or rectovaginal examination  Lab Findings: Lab Results  Component Value Date   WBC 5.7 06/11/2019   HGB 12.6 06/11/2019   HCT 42.9 06/11/2019   MCV 87.2 06/11/2019   PLT  06/11/2019    PLATELET CLUMPS NOTED ON SMEAR, COUNT APPEARS ADEQUATE    Radiographic Findings: No results found.  Impression:  Endometrial Cancer (inoperable).  No obvious signs of recurrence on clinical exam today.  Patient was given a vaginal dilator dilator and instructions on its use.  In light of her symptoms she will proceed to the lab for urinalysis culture and sensitivity.  We will also check blood chemistries and a CBC.  She will be scheduled for CT scan of the abdomen and pelvis.    Plan: CT scans of the abdomen and pelvis as above.  She will be scheduled for follow-up with Dr. [name] in approximately 3 months and radiation oncology in 6 months.  ____________________________________ Gery Pray, MD   This document serves as a record of services personally performed by Gery Pray, MD. It was created on his behalf by Wilburn Mylar, a trained medical scribe. The creation of this record is based on the scribe's personal observations and the provider's statements to them. This document has been checked and approved by the attending provider.

## 2019-11-22 NOTE — Progress Notes (Signed)
Ms. Colicchio presents today after being lost to f/u. Pt reports pain in vaginal area, rated 4/10. Pt reports "a lot of pressure" and frequency of urine. Pt denies UTI. Pt reports burning with urination. Denies hematuria. Pt reports occasional vaginal spotting and a "creamy-like discharge" with "a little" odor. Pt denies rectal bleeding, diarrhea/constipation. Pt reports ongoing "nausea all the time but I've been like that for years". Denies abdominal bloating, vomiting.  BP 135/68 (BP Location: Left Arm, Patient Position: Sitting, Cuff Size: Large)   Pulse 88   Temp 98.3 F (36.8 C)   Resp 20   Wt 203 lb 6.4 oz (92.3 kg)   LMP 04/20/2013   SpO2 96%   BMI 34.91 kg/m   Wt Readings from Last 3 Encounters:  11/22/19 203 lb 6.4 oz (92.3 kg)  06/11/19 208 lb 8 oz (94.6 kg)  05/24/19 213 lb 14.4 oz (97 kg)   Loma Sousa, RN BSN

## 2019-11-22 NOTE — Patient Instructions (Signed)
Coronavirus (COVID-19) Are you at risk?  Are you at risk for the Coronavirus (COVID-19)?  To be considered HIGH RISK for Coronavirus (COVID-19), you have to meet the following criteria:  . Traveled to [place], [place], [place], [place] or [place]; or in the [place] to [place], [place], [place], or [place]; and have fever, cough, and shortness of breath within the last 2 weeks of travel OR . Been in close contact with a person diagnosed with COVID-19 within the last 2 weeks and have fever, cough, and shortness of breath . IF YOU DO NOT MEET THESE CRITERIA, YOU ARE CONSIDERED LOW RISK FOR COVID-19.  What to do if you are HIGH RISK for COVID-19?  . If you are having a medical emergency, call 911. . Seek medical care right away. Before you go to a doctor's office, urgent care or emergency department, call ahead and tell them about your recent travel, contact with someone diagnosed with COVID-19, and your symptoms. You should receive instructions from your physician's office regarding next steps of care.  . When you arrive at healthcare provider, tell the healthcare staff immediately you have returned from visiting [place], [place], [place], [place] or [place]; or traveled in the [place] to [place], [place], [place], or [place]; in the last two weeks or you have been in close contact with a person diagnosed with COVID-19 in the last 2 weeks.   . Tell the health care staff about your symptoms: fever, cough and shortness of breath. . After you have been seen by a medical provider, you will be either: o Tested for (COVID-19) and discharged home on quarantine except to seek medical care if symptoms worsen, and asked to  - Stay home and avoid contact with others until you get your results (4-5 days)  - Avoid travel on public transportation if possible (such as bus, train, or airplane) or o Sent to the Emergency Department by EMS for evaluation, COVID-19 testing, and possible  admission depending on your condition and test results.  What to do if you are LOW RISK for COVID-19?  Reduce your risk of any infection by using the same precautions used for avoiding the common cold or flu:  . Wash your hands often with soap and warm water for at least 20 seconds.  If soap and water are not readily available, use an alcohol-based hand sanitizer with at least 60% alcohol.  . If coughing or sneezing, cover your mouth and nose by coughing or sneezing into the elbow areas of your shirt or coat, into a tissue or into your sleeve (not your hands). . Avoid shaking hands with others and consider head nods or verbal greetings only. . Avoid touching your eyes, nose, or mouth with unwashed hands.  . Avoid close contact with people who are sick. . Avoid places or events with large numbers of people in one location, like concerts or sporting events. . Carefully consider travel plans you have or are making. . If you are planning any travel outside or inside the [place], visit the CDC's Travelers' Health webpage for the latest health notices. . If you have some symptoms but not all symptoms, continue to monitor at home and seek medical attention if your symptoms worsen. . If you are having a medical emergency, call 911.   ADDITIONAL HEALTHCARE OPTIONS FOR PATIENTS  [facility] / e-Visit: [email or web address]         [facility]: [phone]  [facility]: [phone]                   [facility]: [phone]   

## 2019-11-23 LAB — URINE CULTURE: Culture: NO GROWTH

## 2019-11-28 ENCOUNTER — Other Ambulatory Visit: Payer: Self-pay | Admitting: Internal Medicine

## 2019-11-28 DIAGNOSIS — F17209 Nicotine dependence, unspecified, with unspecified nicotine-induced disorders: Secondary | ICD-10-CM

## 2019-12-07 ENCOUNTER — Other Ambulatory Visit: Payer: Self-pay | Admitting: Pharmacist

## 2019-12-07 NOTE — Patient Outreach (Signed)
[facility]) Care Management  12/07/2019  ODALI ISMAIL September 12, 1961 [id number]   Patient's case is being closed because she did not have any urgent pharmacy needs and because the Pharmacy Team has moved over to the Quality Department and will no longer use [facility] for documentation.   Elayne Guerin, PharmD, [place] Clinical Pharmacist [phone]

## 2019-12-10 ENCOUNTER — Ambulatory Visit: Payer: Self-pay | Admitting: Pharmacist

## 2019-12-11 ENCOUNTER — Ambulatory Visit
Admission: RE | Admit: 2019-12-11 | Discharge: 2019-12-11 | Disposition: A | Discharge status: Home / Self Care | Payer: HMO | Source: Ambulatory Visit | Attending: Internal Medicine | Admitting: Internal Medicine

## 2019-12-11 ENCOUNTER — Other Ambulatory Visit (HOSPITAL_COMMUNITY): Payer: Self-pay | Admitting: Psychiatry

## 2019-12-11 DIAGNOSIS — F17209 Nicotine dependence, unspecified, with unspecified nicotine-induced disorders: Secondary | ICD-10-CM

## 2019-12-11 DIAGNOSIS — F17210 Nicotine dependence, cigarettes, uncomplicated: Secondary | ICD-10-CM | POA: Diagnosis not present

## 2019-12-11 DIAGNOSIS — F411 Generalized anxiety disorder: Secondary | ICD-10-CM

## 2019-12-11 DIAGNOSIS — F331 Major depressive disorder, recurrent, moderate: Secondary | ICD-10-CM

## 2019-12-17 ENCOUNTER — Other Ambulatory Visit: Payer: Self-pay | Admitting: Internal Medicine

## 2019-12-17 DIAGNOSIS — E041 Nontoxic single thyroid nodule: Secondary | ICD-10-CM

## 2019-12-18 ENCOUNTER — Other Ambulatory Visit: Payer: Self-pay

## 2019-12-18 ENCOUNTER — Encounter (HOSPITAL_COMMUNITY): Payer: Self-pay

## 2019-12-18 ENCOUNTER — Ambulatory Visit (HOSPITAL_COMMUNITY)
Admission: RE | Admit: 2019-12-18 | Discharge: 2019-12-18 | Disposition: A | Discharge status: Home / Self Care | Payer: HMO | Source: Ambulatory Visit | Attending: Radiation Oncology | Admitting: Radiation Oncology

## 2019-12-18 DIAGNOSIS — C541 Malignant neoplasm of endometrium: Secondary | ICD-10-CM | POA: Insufficient documentation

## 2019-12-18 DIAGNOSIS — R109 Unspecified abdominal pain: Secondary | ICD-10-CM | POA: Diagnosis not present

## 2019-12-18 MED ORDER — SODIUM CHLORIDE (PF) 0.9 % IJ SOLN
INTRAMUSCULAR | Status: AC
  Filled 2019-12-18: qty 50

## 2019-12-18 MED ORDER — IOHEXOL 300 MG/ML  SOLN
100.0000 mL | Freq: Once | INTRAMUSCULAR | Status: AC | PRN
  Administered 2019-12-18: 100 mL via INTRAVENOUS

## 2019-12-19 ENCOUNTER — Ambulatory Visit
Admission: RE | Admit: 2019-12-19 | Discharge: 2019-12-19 | Disposition: A | Discharge status: Home / Self Care | Payer: HMO | Source: Ambulatory Visit | Attending: Internal Medicine | Admitting: Internal Medicine

## 2019-12-19 DIAGNOSIS — E041 Nontoxic single thyroid nodule: Secondary | ICD-10-CM

## 2019-12-20 ENCOUNTER — Encounter (HOSPITAL_COMMUNITY): Payer: Self-pay | Admitting: Psychiatry

## 2019-12-20 ENCOUNTER — Other Ambulatory Visit: Payer: Self-pay

## 2019-12-20 ENCOUNTER — Ambulatory Visit (INDEPENDENT_AMBULATORY_CARE_PROVIDER_SITE_OTHER): Payer: HMO | Admitting: Psychiatry

## 2019-12-20 DIAGNOSIS — F331 Major depressive disorder, recurrent, moderate: Secondary | ICD-10-CM

## 2019-12-20 DIAGNOSIS — F411 Generalized anxiety disorder: Secondary | ICD-10-CM

## 2019-12-20 DIAGNOSIS — F4310 Post-traumatic stress disorder, unspecified: Secondary | ICD-10-CM | POA: Diagnosis not present

## 2019-12-20 MED ORDER — PRAZOSIN HCL 5 MG PO CAPS: 5.0000 mg | ORAL_CAPSULE | Freq: Every day | ORAL | 0 refills | Status: DC

## 2019-12-20 MED ORDER — ARIPIPRAZOLE 5 MG PO TABS: 5.0000 mg | ORAL_TABLET | Freq: Every day | ORAL | 0 refills | Status: DC

## 2019-12-20 MED ORDER — LAMOTRIGINE 150 MG PO TABS: 150.0000 mg | ORAL_TABLET | Freq: Two times a day (BID) | ORAL | 0 refills | Status: DC

## 2019-12-20 MED ORDER — CLONAZEPAM 1 MG PO TABS: ORAL_TABLET | ORAL | 0 refills | Status: DC

## 2019-12-20 MED ORDER — FLUOXETINE HCL 40 MG PO CAPS: 40.0000 mg | ORAL_CAPSULE | Freq: Every day | ORAL | 0 refills | Status: DC

## 2019-12-20 MED ORDER — TRAZODONE HCL 300 MG PO TABS: 300.0000 mg | ORAL_TABLET | Freq: Every day | ORAL | 0 refills | Status: DC
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
## 2019-12-25 ENCOUNTER — Telehealth: Payer: Self-pay | Admitting: Oncology

## 2019-12-25 NOTE — Telephone Encounter (Signed)
Called [name] and advised her of CT results from 12/19/2019.  Also discussed that it will be reviewed at the GYN conference on Monday and that we will let her know the recommendations.  She verbalized understanding and agreement.

## 2019-12-31 ENCOUNTER — Other Ambulatory Visit: Payer: Self-pay | Admitting: Oncology

## 2019-12-31 NOTE — Progress Notes (Signed)
Gynecologic Oncology Multi-Disciplinary Disposition Conference Note  Date of the Conference: 12/31/2019  Patient Name: [name]  Referring Provider: Dr. Benjie Karvonen Primary GYN Oncologist: Dr. [name]  Stage/Disposition:  Clinical stage 1, grade 3 endometrial cancer. Disposition is for a PET scan and appointment with Dr. [name] for a biopsy.   This Multidisciplinary conference took place involving physicians from [facility], [place], Radiation Oncology, Pathology, Radiology along with the Gynecologic Oncology Nurse Practitioner and RN.  Comprehensive assessment of the patient's malignancy, staging, need for surgery, chemotherapy, radiation therapy, and need for further testing were reviewed. Supportive measures, both inpatient and following discharge were also discussed. The recommended plan of care is documented. Greater than 35 minutes were spent correlating and coordinating this patient's care.

## 2020-01-02 ENCOUNTER — Encounter: Payer: Self-pay | Admitting: Oncology

## 2020-01-02 ENCOUNTER — Telehealth: Payer: Self-pay | Admitting: Oncology

## 2020-01-02 DIAGNOSIS — C541 Malignant neoplasm of endometrium: Secondary | ICD-10-CM

## 2020-01-02 NOTE — Telephone Encounter (Signed)
Called [name] and advised her of GYN Tumor Conference recommendations for a PET scan.  Advised her of appointment on 01/11/2020 with arrival at [facility] at 10:30 am (NPO 6 hours before, no carbs 12 hours before, hold metformin that morning).  She verbalized understanding and agreement.

## 2020-01-03 DIAGNOSIS — R600 Localized edema: Secondary | ICD-10-CM | POA: Diagnosis not present

## 2020-01-03 DIAGNOSIS — C541 Malignant neoplasm of endometrium: Secondary | ICD-10-CM | POA: Diagnosis not present

## 2020-01-03 DIAGNOSIS — J449 Chronic obstructive pulmonary disease, unspecified: Secondary | ICD-10-CM | POA: Diagnosis not present

## 2020-01-03 DIAGNOSIS — I10 Essential (primary) hypertension: Secondary | ICD-10-CM | POA: Diagnosis not present

## 2020-01-11 ENCOUNTER — Other Ambulatory Visit: Payer: Self-pay

## 2020-01-11 ENCOUNTER — Ambulatory Visit (HOSPITAL_COMMUNITY)
Admission: RE | Admit: 2020-01-11 | Discharge: 2020-01-11 | Disposition: A | Discharge status: Home / Self Care | Payer: HMO | Source: Ambulatory Visit | Attending: Radiation Oncology | Admitting: Radiation Oncology

## 2020-01-11 DIAGNOSIS — C541 Malignant neoplasm of endometrium: Secondary | ICD-10-CM | POA: Insufficient documentation

## 2020-01-11 DIAGNOSIS — Z923 Personal history of irradiation: Secondary | ICD-10-CM | POA: Insufficient documentation

## 2020-01-11 DIAGNOSIS — R918 Other nonspecific abnormal finding of lung field: Secondary | ICD-10-CM | POA: Diagnosis not present

## 2020-01-11 LAB — GLUCOSE, CAPILLARY: Glucose-Capillary: 115 mg/dL — ABNORMAL HIGH (ref 70–99)

## 2020-01-11 MED ORDER — FLUDEOXYGLUCOSE F - 18 (FDG) INJECTION
11.0000 | Freq: Once | INTRAVENOUS | Status: AC
  Administered 2020-01-11: 11 via INTRAVENOUS

## 2020-01-12 ENCOUNTER — Encounter: Payer: Self-pay | Admitting: Radiation Oncology

## 2020-01-12 NOTE — Progress Notes (Signed)
As the rad/onc physician on-call, I received a call from radiology re: Dr. Clabe Braun patient, [name], who just had a PET scan.  The full results are not available, but she apparently has a pattern in her lungs concerning for COVID 19 Pneumonia.  I called the patient and informed her of the above.  She states she has has some respiratory symptoms but they are not typical of the sx she has had w/ past PNAs. She lives w/ her husband.  I recommended they isolate from others, take masking precautions, and get tested at their earliest convenience.  She plans to call her PCP and arrange testing ASAP.  She expressed appreciation for the call.  -----------------------------------  Christina Gibson, MD

## 2020-01-14 DIAGNOSIS — U071 COVID-19: Secondary | ICD-10-CM | POA: Diagnosis not present

## 2020-01-14 DIAGNOSIS — J449 Chronic obstructive pulmonary disease, unspecified: Secondary | ICD-10-CM | POA: Diagnosis not present

## 2020-01-14 DIAGNOSIS — Z20818 Contact with and (suspected) exposure to other bacterial communicable diseases: Secondary | ICD-10-CM | POA: Diagnosis not present

## 2020-01-14 DIAGNOSIS — C541 Malignant neoplasm of endometrium: Secondary | ICD-10-CM | POA: Diagnosis not present

## 2020-01-28 ENCOUNTER — Telehealth: Payer: Self-pay | Admitting: Oncology

## 2020-01-28 NOTE — Telephone Encounter (Signed)
Called Sadiemae to see how she is doing.  She said she is feeling good and went to her PCP, Dr. Brigitte Pulse and tested negative for Covid.  She had both the rapid and lab test.  She denies having any shortness of breath or cough.  Reviewed her upcoming appointments and will also send her an email per her request.

## 2020-03-15 ENCOUNTER — Other Ambulatory Visit (HOSPITAL_COMMUNITY): Payer: Self-pay | Admitting: Psychiatry

## 2020-03-15 DIAGNOSIS — F331 Major depressive disorder, recurrent, moderate: Secondary | ICD-10-CM

## 2020-03-18 ENCOUNTER — Inpatient Hospital Stay: Payer: HMO | Attending: Gynecologic Oncology | Admitting: Gynecologic Oncology

## 2020-03-19 DIAGNOSIS — Z20818 Contact with and (suspected) exposure to other bacterial communicable diseases: Secondary | ICD-10-CM | POA: Diagnosis not present

## 2020-03-19 DIAGNOSIS — J449 Chronic obstructive pulmonary disease, unspecified: Secondary | ICD-10-CM | POA: Diagnosis not present

## 2020-03-19 DIAGNOSIS — H109 Unspecified conjunctivitis: Secondary | ICD-10-CM | POA: Diagnosis not present

## 2020-03-19 DIAGNOSIS — J209 Acute bronchitis, unspecified: Secondary | ICD-10-CM | POA: Diagnosis not present

## 2020-03-19 DIAGNOSIS — Z1152 Encounter for screening for COVID-19: Secondary | ICD-10-CM | POA: Diagnosis not present

## 2020-03-19 DIAGNOSIS — E1169 Type 2 diabetes mellitus with other specified complication: Secondary | ICD-10-CM | POA: Diagnosis not present

## 2020-03-19 DIAGNOSIS — F17200 Nicotine dependence, unspecified, uncomplicated: Secondary | ICD-10-CM | POA: Diagnosis not present

## 2020-04-16 ENCOUNTER — Other Ambulatory Visit: Payer: Self-pay

## 2020-04-16 NOTE — Patient Outreach (Signed)
[facility]) Care Management Chronic Special Needs Program  04/16/2020  Name: [name] DOB: Jan 20, 1961  MRN: [id number]  Ms. [name] is enrolled in a chronic special needs plan for Diabetes. Reviewed and updated care plan.  Subjective: Client states that she has recovered from her radiation treatments.  States she has lost 40 lbs since the start of the year.  States her appetite has improved but she is trying to continue to watch what she eats and lose weight.  States she is now off of the metformin and she is to go back to see her doctor in August to see if she can stay off diabetes medications.  States she does not check her blood sugars but she does have a glucometer she can use if needed.  States her chronic pain is under control and she does not take any pain medications.  States she is seeing Dr.Arfeen regularly and her depression is under control.  States her B/P is low when it is checked.  States her asthma flares up sometimes and she is using her inhaler as ordered.  States she and her husband are going to get their first [place] shot today.  States she has been wearing her mask when she goes out. Declines information on Advanced Directives   Goals Addressed              This Visit's Progress   .  COMPLETED: Client understands the importance of follow-up with providers by attending scheduled visits   On track     Keeping scheduled appointments Plan to keep scheduled appointments with providers Goal completed 04/16/20    .  Client will use Assistive Devices as needed and verbalize understanding of device use   Not on track     Check blood sugars daily either fasting or 1 1/2 hours after eating with goal of 80-130 fasting and 180 or less after meals Reviewed use of glucometer Not checking blood sugars     .  Client will verbalize knowledge of chronic lung disease as evidenced by no ED visits or Inpatient stays related to chronic lung disease    On  track     Reports no changes in COPD/asthma with no ED or hospital visits Reviewed COPD/asthma action plan Contact your provider for any signs and symptoms of mild shortness of breath Call 911 if you are having severe shortness of breath    .  Client will verbalize knowledge of self management of Hypertension as evidences by BP reading of 140/90 or less; or as defined by provider   On track     Reports B/P less than 140/90 when checked Plan to check B/P regularly Take B/P medications as ordered Plan to follow a low salt diet  Increase activity as tolerated    .  Diabetes Patient stated goal to no longer need diabetes medicaton in the next 12 months (pt-stated)   On track     Currently not taking Metformin Encouraged to maintain her weight loss Client declines referral to [facility] Health Coach for nutritional couseling Reviewed diabetes self management actions to lower Hemoglobin A1C  Reviewed approximate amount of CHOs to aim for at meals ( 30-45 gm ) and snacks (15 gms) Plan to limit sugar sweetened drinks and drink more water     .  HEMOGLOBIN A1C < 7 (pt-stated)        Last Hemoglobin A1C 6.8% on 05/18/19 Plan to check blood sugars as directed with  goals fasting or 1 1/2 hours after eating with goal of 80-130 fasting and 180 or less after meals Plan to follow a low carbohydrate, low salt diet, watch portion sizes and avoid sugar sweetened drinks    .  COMPLETED: Maintain timely refills of diabetic medication as prescribed within the year .   On track     Maintaining timely refills of medications per dispense report Reinforced importance of getting medications refilled on time Goal completed 04/16/20    .  Obtain annual  Lipid Profile, LDL-C   On track     Completed 05/07/19 LDL 47 The goal for LDL is less than 70 mg/dL as you are at high risk for complications Try to avoid saturated fats, trans-fats and eat more fiber Plan to take statin as ordered      .   Obtain Annual Eye (retinal)  Exam    Not on track     Reinforced importance of having annual dilated eye exam Please call your [employer] concierge [phone]) if you need help finding an eye doctor Declines RNCM contacting concierge     .  Obtain Annual Foot Exam   On track     Check your skin and feet every day for cuts, bruises, redness, blisters, or sores. Schedule a foot exam with your health care provider once every year    .  Obtain annual screen for micro albuminuria (urine) , nephropathy (kidney problems)   On track     It is important for your doctor to check your urine for protein at least every year    .  Obtain Hemoglobin A1C at least 2 times per year   On track     Completed 05/07/19 It is important to have your Hemoglobin A1C checked every 6 months if you are at goal and every 3 months if you are not at goal    .  Patient Stated continue to cope with chronic leg pain        Plan to continue to follow up with provider and contact if pain increases Sent EMMI: Chronic pain    .  Visit Primary Care Provider or Endocrinologist at least 2 times per year    On track     Primary care provider 03/19/20 Plan to keep scheduled primary care provider visit in August Please schedule your annual wellness visit     Reviewed COVID 19 precautions  Plan:  Send successful outreach letter with a copy of their individualized care plan, Send individual care plan to provider and Send educational material-EMMI: Chronic Pain  Client will be outreached by a [employer] [facility] RNCM in 6 months per tier level     [place], Jackquline Denmark, [place] [facility] [phone]

## 2020-04-25 ENCOUNTER — Other Ambulatory Visit (HOSPITAL_COMMUNITY): Payer: Self-pay | Admitting: Psychiatry

## 2020-04-25 DIAGNOSIS — F331 Major depressive disorder, recurrent, moderate: Secondary | ICD-10-CM

## 2020-04-25 DIAGNOSIS — F411 Generalized anxiety disorder: Secondary | ICD-10-CM

## 2020-04-25 DIAGNOSIS — F4310 Post-traumatic stress disorder, unspecified: Secondary | ICD-10-CM

## 2020-04-28 ENCOUNTER — Ambulatory Visit: Payer: HMO

## 2020-05-02 ENCOUNTER — Other Ambulatory Visit: Payer: Self-pay

## 2020-05-02 ENCOUNTER — Telehealth (HOSPITAL_COMMUNITY): Payer: HMO | Admitting: Psychiatry

## 2020-05-06 ENCOUNTER — Other Ambulatory Visit (HOSPITAL_COMMUNITY): Payer: Self-pay | Admitting: Psychiatry

## 2020-05-06 DIAGNOSIS — F411 Generalized anxiety disorder: Secondary | ICD-10-CM

## 2020-05-06 DIAGNOSIS — F331 Major depressive disorder, recurrent, moderate: Secondary | ICD-10-CM

## 2020-05-13 DIAGNOSIS — E1169 Type 2 diabetes mellitus with other specified complication: Secondary | ICD-10-CM | POA: Diagnosis not present

## 2020-05-13 DIAGNOSIS — E041 Nontoxic single thyroid nodule: Secondary | ICD-10-CM | POA: Diagnosis not present

## 2020-05-13 DIAGNOSIS — E785 Hyperlipidemia, unspecified: Secondary | ICD-10-CM | POA: Diagnosis not present

## 2020-05-15 DIAGNOSIS — F3341 Major depressive disorder, recurrent, in partial remission: Secondary | ICD-10-CM | POA: Diagnosis not present

## 2020-05-15 DIAGNOSIS — F319 Bipolar disorder, unspecified: Secondary | ICD-10-CM | POA: Diagnosis not present

## 2020-05-15 DIAGNOSIS — I10 Essential (primary) hypertension: Secondary | ICD-10-CM | POA: Diagnosis not present

## 2020-05-15 DIAGNOSIS — I700 Atherosclerosis of aorta: Secondary | ICD-10-CM | POA: Diagnosis not present

## 2020-05-15 DIAGNOSIS — J449 Chronic obstructive pulmonary disease, unspecified: Secondary | ICD-10-CM | POA: Diagnosis not present

## 2020-05-15 DIAGNOSIS — E1169 Type 2 diabetes mellitus with other specified complication: Secondary | ICD-10-CM | POA: Diagnosis not present

## 2020-05-15 DIAGNOSIS — B351 Tinea unguium: Secondary | ICD-10-CM | POA: Diagnosis not present

## 2020-05-15 DIAGNOSIS — L989 Disorder of the skin and subcutaneous tissue, unspecified: Secondary | ICD-10-CM | POA: Diagnosis not present

## 2020-05-15 DIAGNOSIS — E785 Hyperlipidemia, unspecified: Secondary | ICD-10-CM | POA: Diagnosis not present

## 2020-05-15 DIAGNOSIS — F17201 Nicotine dependence, unspecified, in remission: Secondary | ICD-10-CM | POA: Diagnosis not present

## 2020-05-15 DIAGNOSIS — Z0000 Encounter for general adult medical examination without abnormal findings: Secondary | ICD-10-CM | POA: Diagnosis not present

## 2020-05-16 ENCOUNTER — Other Ambulatory Visit: Payer: Self-pay | Admitting: Internal Medicine

## 2020-05-16 DIAGNOSIS — K921 Melena: Secondary | ICD-10-CM | POA: Diagnosis not present

## 2020-05-19 ENCOUNTER — Other Ambulatory Visit: Payer: Self-pay | Admitting: Internal Medicine

## 2020-05-19 DIAGNOSIS — F17201 Nicotine dependence, unspecified, in remission: Secondary | ICD-10-CM

## 2020-05-28 ENCOUNTER — Other Ambulatory Visit (HOSPITAL_COMMUNITY): Payer: Self-pay | Admitting: Psychiatry

## 2020-05-28 DIAGNOSIS — F331 Major depressive disorder, recurrent, moderate: Secondary | ICD-10-CM

## 2020-05-28 DIAGNOSIS — F411 Generalized anxiety disorder: Secondary | ICD-10-CM

## 2020-05-28 DIAGNOSIS — F4310 Post-traumatic stress disorder, unspecified: Secondary | ICD-10-CM

## 2020-05-30 ENCOUNTER — Inpatient Hospital Stay: Admission: RE | Admit: 2020-05-30 | Payer: HMO | Source: Ambulatory Visit

## 2020-06-02 ENCOUNTER — Ambulatory Visit
Admission: RE | Admit: 2020-06-02 | Discharge: 2020-06-02 | Disposition: A | Discharge status: Home / Self Care | Payer: HMO | Source: Ambulatory Visit | Attending: Radiation Oncology | Admitting: Radiation Oncology

## 2020-06-08 ENCOUNTER — Other Ambulatory Visit (HOSPITAL_COMMUNITY): Payer: Self-pay | Admitting: Psychiatry

## 2020-06-08 DIAGNOSIS — F331 Major depressive disorder, recurrent, moderate: Secondary | ICD-10-CM

## 2020-06-08 DIAGNOSIS — F4310 Post-traumatic stress disorder, unspecified: Secondary | ICD-10-CM

## 2020-07-31 ENCOUNTER — Encounter (HOSPITAL_COMMUNITY): Payer: Self-pay | Admitting: Psychiatry

## 2020-07-31 ENCOUNTER — Telehealth (INDEPENDENT_AMBULATORY_CARE_PROVIDER_SITE_OTHER): Payer: HMO | Admitting: Psychiatry

## 2020-07-31 ENCOUNTER — Other Ambulatory Visit: Payer: Self-pay

## 2020-07-31 DIAGNOSIS — F331 Major depressive disorder, recurrent, moderate: Secondary | ICD-10-CM

## 2020-07-31 DIAGNOSIS — F411 Generalized anxiety disorder: Secondary | ICD-10-CM | POA: Diagnosis not present

## 2020-07-31 DIAGNOSIS — F4310 Post-traumatic stress disorder, unspecified: Secondary | ICD-10-CM

## 2020-07-31 MED ORDER — DOXEPIN HCL 25 MG PO CAPS: 25.0000 mg | ORAL_CAPSULE | Freq: Every day | ORAL | 1 refills | Status: DC

## 2020-07-31 MED ORDER — LAMOTRIGINE 150 MG PO TABS: 150.0000 mg | ORAL_TABLET | Freq: Two times a day (BID) | ORAL | 0 refills | Status: DC

## 2020-07-31 MED ORDER — FLUOXETINE HCL 40 MG PO CAPS: ORAL_CAPSULE | ORAL | 1 refills | Status: DC

## 2020-07-31 MED ORDER — CLONAZEPAM 1 MG PO TABS: ORAL_TABLET | ORAL | 1 refills | Status: DC

## 2020-07-31 MED ORDER — ARIPIPRAZOLE 5 MG PO TABS: 5.0000 mg | ORAL_TABLET | Freq: Every day | ORAL | 1 refills | Status: DC
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
## 2020-08-22 ENCOUNTER — Other Ambulatory Visit (HOSPITAL_COMMUNITY): Payer: Self-pay | Admitting: Psychiatry

## 2020-08-22 DIAGNOSIS — F331 Major depressive disorder, recurrent, moderate: Secondary | ICD-10-CM

## 2020-08-22 DIAGNOSIS — F4310 Post-traumatic stress disorder, unspecified: Secondary | ICD-10-CM

## 2020-08-26 ENCOUNTER — Other Ambulatory Visit: Payer: Self-pay

## 2020-08-26 NOTE — Patient Outreach (Signed)
  [facility]) Care Management Chronic Special Needs Program    08/26/2020  Name: SAMANVITHA GERMANY, DOB: 12/13/1960  MRN: [id number]   Ms. Story Conti is enrolled in a chronic special needs plan for Diabetes.  [facility] Management will continue to provide services for this client through 09/19/2020. The [employer] [facility] [employer] care management team will assume care 09/20/2020.   Peter Garter RN, Jackquline Denmark, CDE Chronic Care Management Coordinator [facility] Care Management [phone]

## 2020-08-29 ENCOUNTER — Other Ambulatory Visit: Payer: Self-pay

## 2020-08-29 NOTE — Patient Outreach (Signed)
  [facility]) Care Management Chronic Special Needs Program    08/29/2020  Name: [name], DOB: 1960-11-23  MRN: [id number]   Ms. [name] is enrolled in a chronic special needs plan for Diabetes.  [employer] [facility] [employer] Care Management Team has assumed care and services for this member. Case closed by [place] RN, [facility], [place] Management [phone]

## 2020-09-16 ENCOUNTER — Encounter (HOSPITAL_COMMUNITY): Payer: Self-pay | Admitting: Psychiatry

## 2020-09-16 ENCOUNTER — Other Ambulatory Visit: Payer: Self-pay

## 2020-09-16 ENCOUNTER — Telehealth (INDEPENDENT_AMBULATORY_CARE_PROVIDER_SITE_OTHER): Payer: HMO | Admitting: Psychiatry

## 2020-09-16 DIAGNOSIS — F331 Major depressive disorder, recurrent, moderate: Secondary | ICD-10-CM

## 2020-09-16 DIAGNOSIS — F4310 Post-traumatic stress disorder, unspecified: Secondary | ICD-10-CM

## 2020-09-16 DIAGNOSIS — F411 Generalized anxiety disorder: Secondary | ICD-10-CM

## 2020-09-16 MED ORDER — DOXEPIN HCL 25 MG PO CAPS: 25.0000 mg | ORAL_CAPSULE | Freq: Every day | ORAL | 2 refills | Status: DC

## 2020-09-16 MED ORDER — FLUOXETINE HCL 40 MG PO CAPS: ORAL_CAPSULE | ORAL | 2 refills | Status: DC

## 2020-09-16 MED ORDER — CLONAZEPAM 1 MG PO TABS: ORAL_TABLET | ORAL | 2 refills | Status: DC

## 2020-09-16 MED ORDER — LAMOTRIGINE 150 MG PO TABS: 150.0000 mg | ORAL_TABLET | Freq: Two times a day (BID) | ORAL | 2 refills | Status: DC

## 2020-09-16 MED ORDER — ARIPIPRAZOLE 5 MG PO TABS: 5.0000 mg | ORAL_TABLET | Freq: Every day | ORAL | 2 refills | Status: DC
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
## 2020-09-21 ENCOUNTER — Other Ambulatory Visit (HOSPITAL_COMMUNITY): Payer: Self-pay | Admitting: Psychiatry

## 2020-09-21 DIAGNOSIS — F411 Generalized anxiety disorder: Secondary | ICD-10-CM

## 2020-09-21 DIAGNOSIS — F331 Major depressive disorder, recurrent, moderate: Secondary | ICD-10-CM

## 2020-12-04 DIAGNOSIS — I700 Atherosclerosis of aorta: Secondary | ICD-10-CM | POA: Diagnosis not present

## 2020-12-04 DIAGNOSIS — I10 Essential (primary) hypertension: Secondary | ICD-10-CM | POA: Diagnosis not present

## 2020-12-04 DIAGNOSIS — E785 Hyperlipidemia, unspecified: Secondary | ICD-10-CM | POA: Diagnosis not present

## 2020-12-04 DIAGNOSIS — M797 Fibromyalgia: Secondary | ICD-10-CM | POA: Diagnosis not present

## 2020-12-04 DIAGNOSIS — E041 Nontoxic single thyroid nodule: Secondary | ICD-10-CM | POA: Diagnosis not present

## 2020-12-04 DIAGNOSIS — C541 Malignant neoplasm of endometrium: Secondary | ICD-10-CM | POA: Diagnosis not present

## 2020-12-04 DIAGNOSIS — F419 Anxiety disorder, unspecified: Secondary | ICD-10-CM | POA: Diagnosis not present

## 2020-12-04 DIAGNOSIS — E1169 Type 2 diabetes mellitus with other specified complication: Secondary | ICD-10-CM | POA: Diagnosis not present

## 2020-12-04 DIAGNOSIS — J449 Chronic obstructive pulmonary disease, unspecified: Secondary | ICD-10-CM | POA: Diagnosis not present

## 2020-12-04 DIAGNOSIS — E781 Pure hyperglyceridemia: Secondary | ICD-10-CM | POA: Diagnosis not present

## 2020-12-04 DIAGNOSIS — F3341 Major depressive disorder, recurrent, in partial remission: Secondary | ICD-10-CM | POA: Diagnosis not present

## 2020-12-05 ENCOUNTER — Other Ambulatory Visit: Payer: Self-pay | Admitting: Internal Medicine

## 2020-12-05 DIAGNOSIS — E041 Nontoxic single thyroid nodule: Secondary | ICD-10-CM

## 2020-12-08 ENCOUNTER — Telehealth (HOSPITAL_COMMUNITY): Payer: HMO | Admitting: Psychiatry

## 2020-12-09 ENCOUNTER — Telehealth (INDEPENDENT_AMBULATORY_CARE_PROVIDER_SITE_OTHER): Payer: HMO | Admitting: Psychiatry

## 2020-12-09 ENCOUNTER — Other Ambulatory Visit: Payer: Self-pay

## 2020-12-09 ENCOUNTER — Encounter (HOSPITAL_COMMUNITY): Payer: Self-pay | Admitting: Psychiatry

## 2020-12-09 ENCOUNTER — Telehealth (HOSPITAL_COMMUNITY): Payer: Self-pay | Admitting: Psychiatry

## 2020-12-09 DIAGNOSIS — F4310 Post-traumatic stress disorder, unspecified: Secondary | ICD-10-CM

## 2020-12-09 DIAGNOSIS — F411 Generalized anxiety disorder: Secondary | ICD-10-CM | POA: Diagnosis not present

## 2020-12-09 DIAGNOSIS — F331 Major depressive disorder, recurrent, moderate: Secondary | ICD-10-CM

## 2020-12-09 MED ORDER — FLUOXETINE HCL 40 MG PO CAPS: ORAL_CAPSULE | ORAL | 1 refills | Status: DC

## 2020-12-09 MED ORDER — LAMOTRIGINE 150 MG PO TABS: 150.0000 mg | ORAL_TABLET | Freq: Two times a day (BID) | ORAL | 1 refills | Status: DC

## 2020-12-09 MED ORDER — CLONAZEPAM 1 MG PO TABS: ORAL_TABLET | ORAL | 1 refills | Status: DC

## 2020-12-09 MED ORDER — ARIPIPRAZOLE 5 MG PO TABS: 5.0000 mg | ORAL_TABLET | Freq: Every day | ORAL | 1 refills | Status: DC

## 2020-12-09 MED ORDER — DOXEPIN HCL 50 MG PO CAPS: 50.0000 mg | ORAL_CAPSULE | Freq: Every day | ORAL | 1 refills | Status: DC
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
## 2020-12-09 NOTE — Telephone Encounter (Signed)
D:  Dr. Adele Schilder referred pt to [facility].  A:  Placed call and oriented pt.  Pt states she would like to verify her insurance benefits before signing up.  Inform Dr. Adele Schilder.  R:  Pt receptive.

## 2021-01-03 ENCOUNTER — Other Ambulatory Visit (HOSPITAL_COMMUNITY): Payer: Self-pay | Admitting: Psychiatry

## 2021-01-03 DIAGNOSIS — F4310 Post-traumatic stress disorder, unspecified: Secondary | ICD-10-CM

## 2021-01-03 DIAGNOSIS — F331 Major depressive disorder, recurrent, moderate: Secondary | ICD-10-CM

## 2021-01-03 DIAGNOSIS — F411 Generalized anxiety disorder: Secondary | ICD-10-CM

## 2021-01-13 IMAGING — CT CT ABDOMEN AND PELVIS WITH CONTRAST
2 of 5 series · 16 of 46 positions shown, 18 images · IV contrast (APPLIED)
Comparison: 09/22/2018

CLINICAL DATA: Endometrial cancer, diagnosed October 2018. Right
lower quadrant pain with vaginal bleeding. History of partial
colectomy status post reversal for diverticulitis. Prior
oophorectomy, cholecystectomy, and appendectomy.

EXAM:
CT ABDOMEN AND PELVIS WITH CONTRAST
TECHNIQUE: Multidetector CT imaging of the abdomen and pelvis was performed
using the standard protocol following bolus administration of
intravenous contrast.
CONTRAST:  100mL OMNIPAQUE IOHEXOL 300 MG/ML  SOLN

[Series 2: axial st · axial · 0.95mm/px · z∈[-476,-91]mm · 13 of 89 slices shown, 15 images]
[im 6/89  soft-tissue]
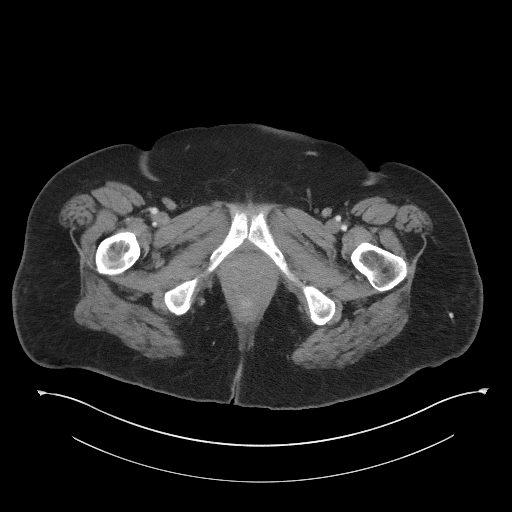
[im 6/89  bone]
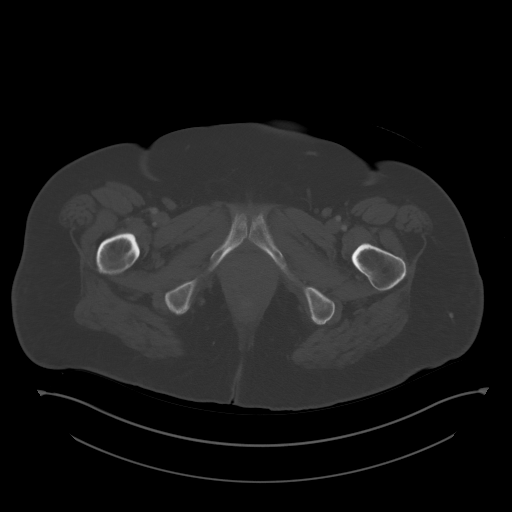
[im 12/89  soft-tissue]
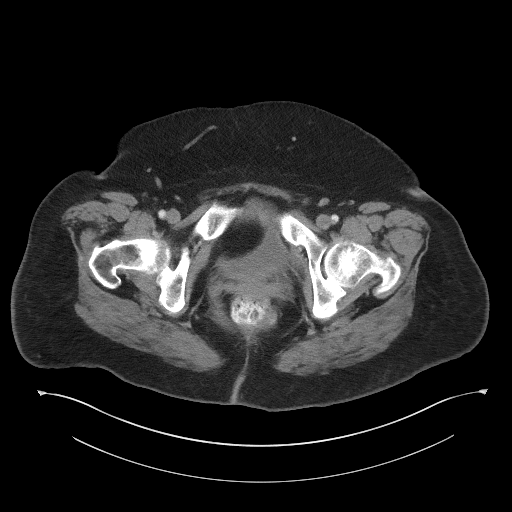
[im 18/89  soft-tissue]
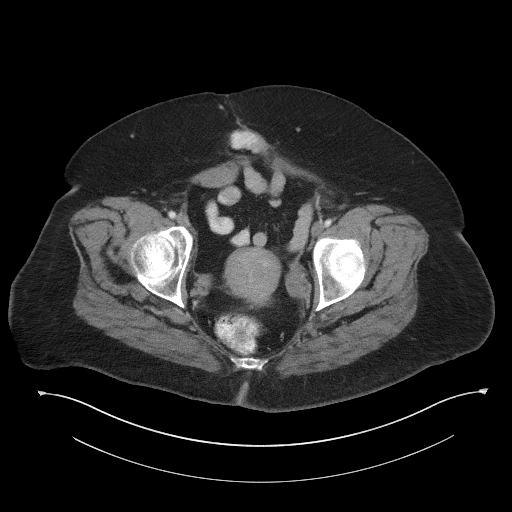
[im 24/89  soft-tissue]
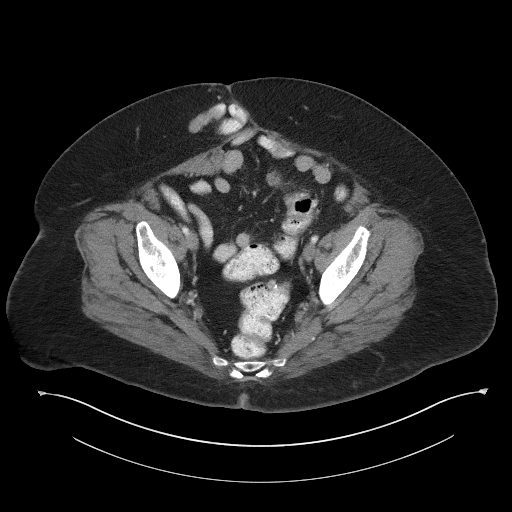
[im 30/89  soft-tissue]
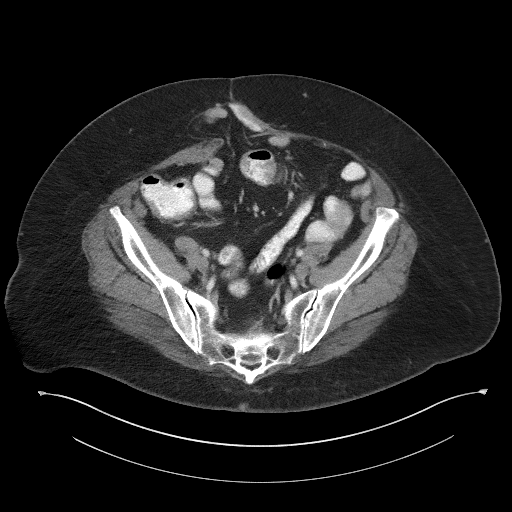
[im 36/89  soft-tissue]
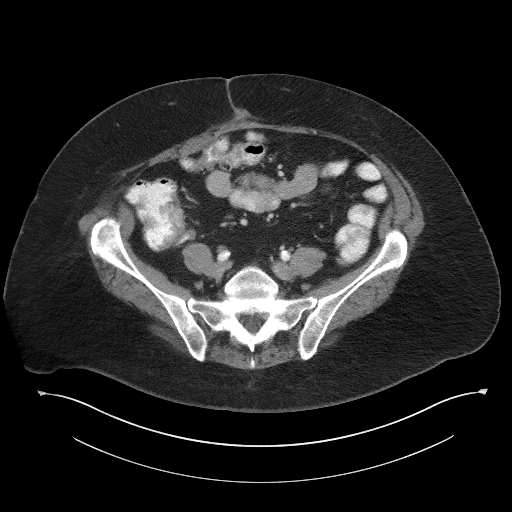
[im 47/89  soft-tissue]
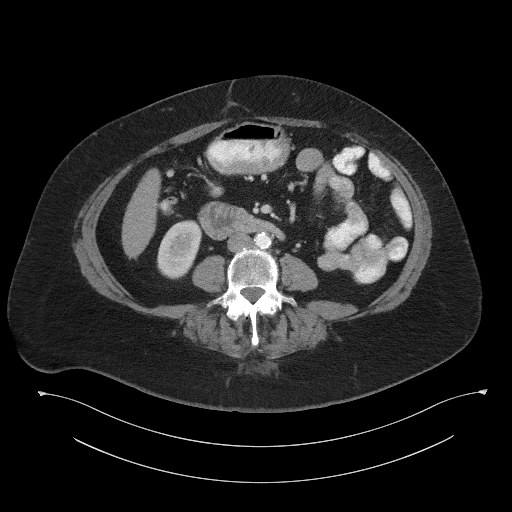
[im 53/89  soft-tissue]
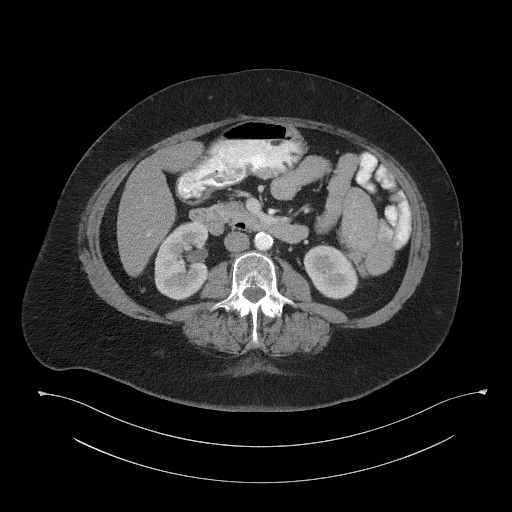
[im 59/89  soft-tissue]
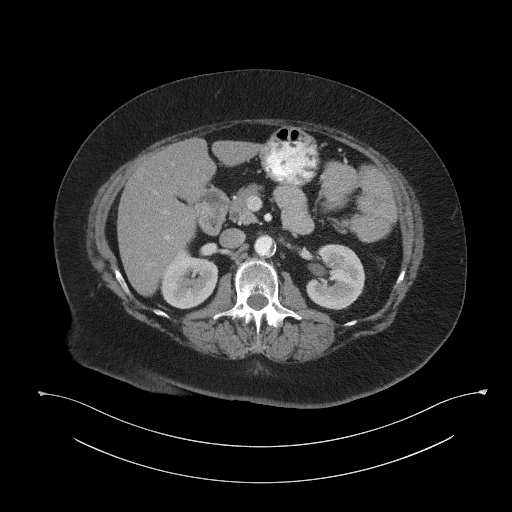
[im 59/89  bone]
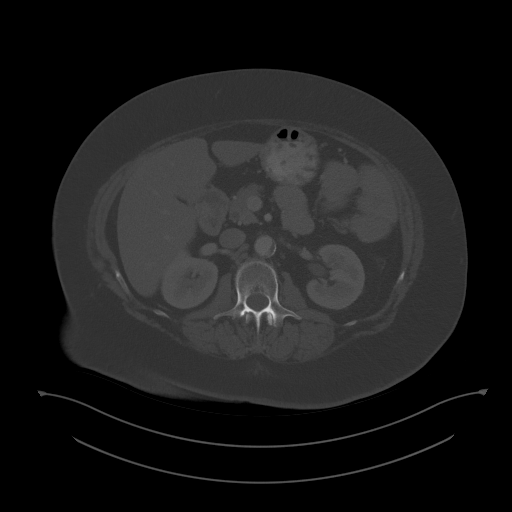
[im 65/89  soft-tissue]
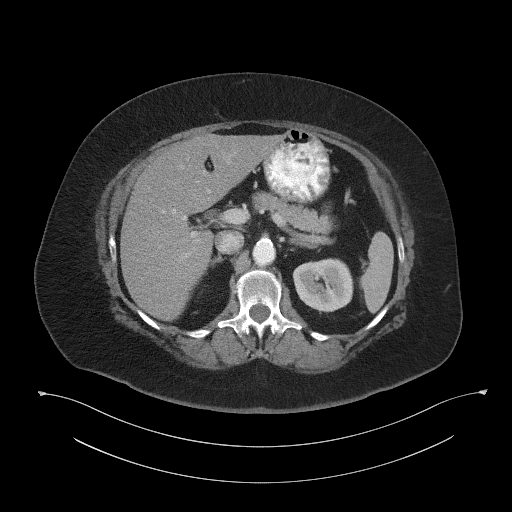
[im 71/89  soft-tissue]
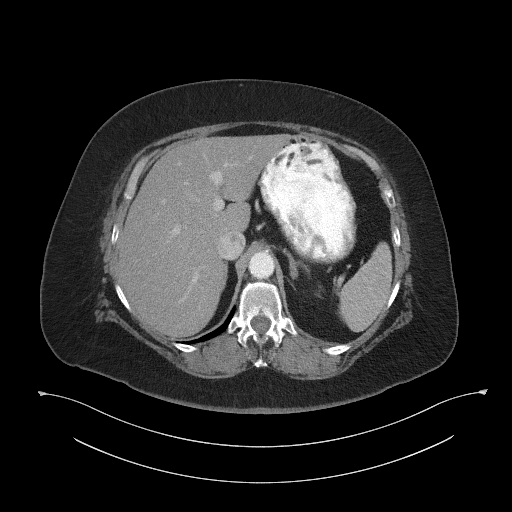
[im 77/89  soft-tissue]
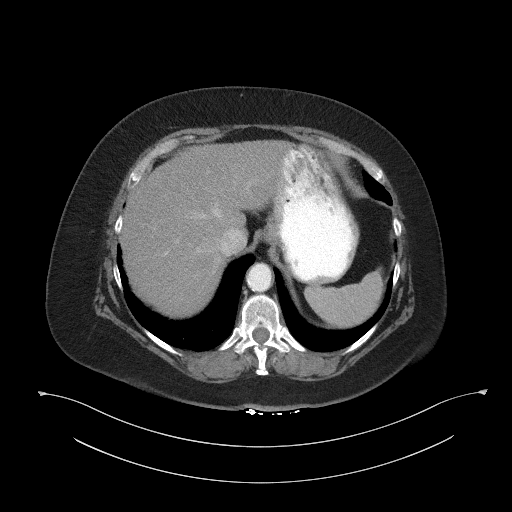
[im 83/89  soft-tissue]
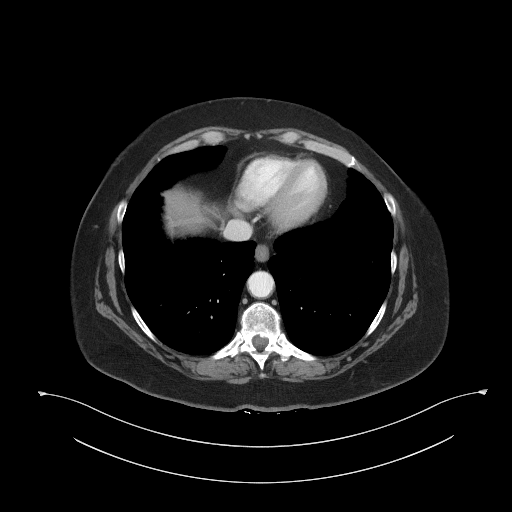

[Series 5: coronal st · coronal · 0.84mm/px · 3 of 107 slices shown]
[im 36/107  soft-tissue]
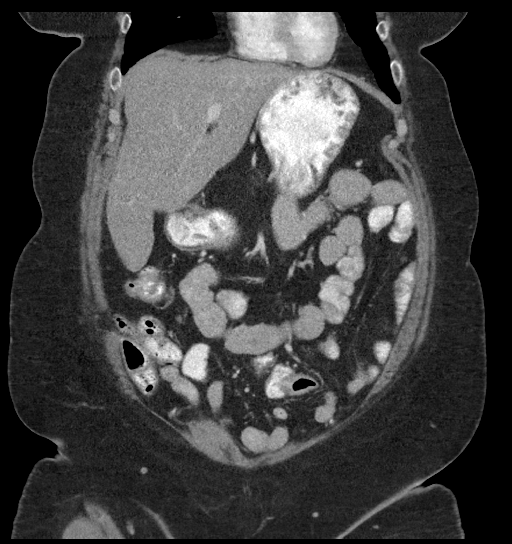
[im 48/107  soft-tissue]
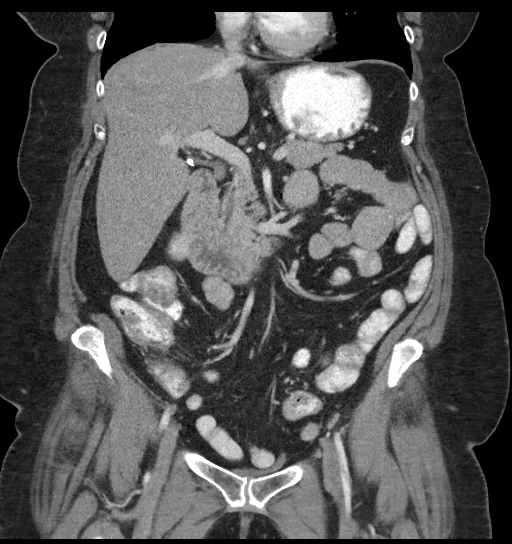
[im 59/107  soft-tissue]
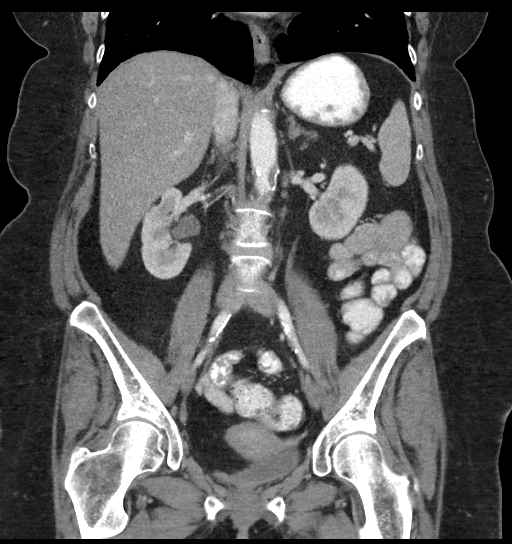

[16 of 46 positions shown; findings below may reference images not displayed]

FINDINGS: Lower chest: Lung bases are clear.

Hepatobiliary: Liver is within normal limits.

Status post cholecystectomy. No intrahepatic ductal dilatation.
Common duct measures 9 mm and smoothly tapers at the ampulla.

Pancreas: Within normal limits.

Spleen: Within normal limits.

Adrenals/Urinary Tract: Adrenal glands are within normal limits.

3 mm nonobstructing right lower pole renal calculus (series 2/image
39). Left kidney is within normal limits. No hydronephrosis.

Bladder is underdistended but unremarkable.

Stomach/Bowel: Stomach is within normal limits.

Right paramidline periumbilical hernia containing loops of
nondilated small bowel (series 2/image 59), unchanged. Additional
small infraumbilical midline ventral hernia containing loops of
nondilated small bowel (series 2/image 70), unchanged. No evidence
of bowel obstruction.

Prior appendectomy.

Status post left hemicolectomy with suture line in the lower pelvis
(series 2/image 65). No colonic wall thickening or inflammatory
changes.

Vascular/Lymphatic: No evidence of abdominal aortic aneurysm.

Atherosclerotic calcifications of the abdominal aorta and branch
vessels.

No suspicious abdominopelvic lymphadenopathy.

Reproductive: Heterogeneous uterus, likely reflecting uterine
fibroids, although with concomitant thickened endometrium (sagittal
image 71) in this patient with known endometrial cancer.

Left ovary is within normal limits. Residual right ovarian tissue is
suspected (series 2/image 69) in this patient with reported history
of oophorectomy.

Other: No abdominopelvic ascites.

No peritoneal nodularity or omental caking is evident on CT.

Musculoskeletal: Mild degenerative changes of the lower thoracic
spine.
IMPRESSION: Heterogeneous uterus with thickened endometrium, corresponding to
the patient's known endometrial cancer.

No findings suspicious for metastatic disease.

Status post cholecystectomy, appendectomy, and left hemicolectomy.

3 mm nonobstructing right lower pole renal calculus. No
hydronephrosis.

Additional ancillary findings as above.

## 2021-01-20 ENCOUNTER — Telehealth (HOSPITAL_COMMUNITY): Payer: HMO | Admitting: Psychiatry

## 2021-01-21 ENCOUNTER — Other Ambulatory Visit: Payer: Self-pay

## 2021-01-21 ENCOUNTER — Encounter (HOSPITAL_COMMUNITY): Payer: Self-pay | Admitting: Psychiatry

## 2021-01-21 ENCOUNTER — Other Ambulatory Visit (HOSPITAL_COMMUNITY): Payer: Self-pay | Admitting: Psychiatry

## 2021-01-21 ENCOUNTER — Telehealth (INDEPENDENT_AMBULATORY_CARE_PROVIDER_SITE_OTHER): Payer: HMO | Admitting: Psychiatry

## 2021-01-21 DIAGNOSIS — F411 Generalized anxiety disorder: Secondary | ICD-10-CM

## 2021-01-21 DIAGNOSIS — F331 Major depressive disorder, recurrent, moderate: Secondary | ICD-10-CM

## 2021-01-21 DIAGNOSIS — F4310 Post-traumatic stress disorder, unspecified: Secondary | ICD-10-CM | POA: Diagnosis not present

## 2021-01-21 MED ORDER — LAMOTRIGINE 150 MG PO TABS: 150.0000 mg | ORAL_TABLET | Freq: Two times a day (BID) | ORAL | 1 refills | Status: DC

## 2021-01-21 MED ORDER — CLONAZEPAM 1 MG PO TABS: ORAL_TABLET | ORAL | 1 refills | Status: DC

## 2021-01-21 MED ORDER — ARIPIPRAZOLE 5 MG PO TABS: 5.0000 mg | ORAL_TABLET | Freq: Every day | ORAL | 1 refills | Status: DC

## 2021-01-21 MED ORDER — FLUOXETINE HCL 40 MG PO CAPS
ORAL_CAPSULE | ORAL | 1 refills | Status: DC
Start: 2021-01-21 — End: 2021-03-04

## 2021-01-21 MED ORDER — DOXEPIN HCL 75 MG PO CAPS: 75.0000 mg | ORAL_CAPSULE | Freq: Every day | ORAL | 1 refills | Status: DC
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
## 2021-01-28 ENCOUNTER — Other Ambulatory Visit: Payer: Self-pay

## 2021-01-28 ENCOUNTER — Ambulatory Visit (INDEPENDENT_AMBULATORY_CARE_PROVIDER_SITE_OTHER): Payer: HMO | Admitting: Licensed Clinical Social Worker

## 2021-01-28 DIAGNOSIS — F332 Major depressive disorder, recurrent severe without psychotic features: Secondary | ICD-10-CM | POA: Diagnosis not present

## 2021-01-28 DIAGNOSIS — F411 Generalized anxiety disorder: Secondary | ICD-10-CM

## 2021-01-28 DIAGNOSIS — F4310 Post-traumatic stress disorder, unspecified: Secondary | ICD-10-CM | POA: Diagnosis not present
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
## 2021-02-07 ENCOUNTER — Other Ambulatory Visit (HOSPITAL_COMMUNITY): Payer: Self-pay | Admitting: Psychiatry

## 2021-02-07 DIAGNOSIS — F4310 Post-traumatic stress disorder, unspecified: Secondary | ICD-10-CM

## 2021-02-10 ENCOUNTER — Other Ambulatory Visit: Payer: Self-pay

## 2021-02-10 ENCOUNTER — Ambulatory Visit (INDEPENDENT_AMBULATORY_CARE_PROVIDER_SITE_OTHER): Payer: HMO | Admitting: Licensed Clinical Social Worker

## 2021-02-10 DIAGNOSIS — F332 Major depressive disorder, recurrent severe without psychotic features: Secondary | ICD-10-CM | POA: Diagnosis not present

## 2021-02-10 DIAGNOSIS — F411 Generalized anxiety disorder: Secondary | ICD-10-CM

## 2021-02-10 DIAGNOSIS — F4310 Post-traumatic stress disorder, unspecified: Secondary | ICD-10-CM

## 2021-02-10 NOTE — Progress Notes (Signed)
Virtual Visit via Video Note   I connected with [name] on 02/10/21 at 11:00am by video enabled telemedicine application and verified that I am speaking with the correct person using two identifiers.   I discussed the limitations, risks, security and privacy concerns of performing an evaluation and management service by video and the availability of in person appointments. I also discussed with the patient that there may be a patient responsible charge related to this service. The patient expressed understanding and agreed to proceed.   I discussed the assessment and treatment plan with the patient. The patient was provided an opportunity to ask questions and all were answered. The patient agreed with the plan and demonstrated an understanding of the instructions.   The patient was advised to call back or seek an in-person evaluation if the symptoms worsen or if the condition fails to improve as anticipated.   I provided 45 minutes of non-face-to-face time during this encounter.     Shade Flood, LCSW, LCAS __________________________ THERAPIST PROGRESS NOTE   Session Time: 11:00am - 11:45am   Location: Patient: Patient Home Provider: [facility]    Participation Level: Active   Behavioral Response: Alert, casually dressed, depressed mood/affect   Type of Therapy:  Individual Therapy   Treatment Goals addressed: Medication management; Anxiety, panic attack, and depression management; Exploring triggers related to marital stress     Interventions: CBT, assertive communication skills and boundaries, mindful breathing meditation    Summary: Chaniyah Jahr is a 60 year old married Caucasian female on disability who presented for therapy session today with diagnoses of Generalized anxiety disorder; Major depressive disorder, recurrent, severe; and PTSD.     Suicidal/Homicidal: None; without plan or intent    Therapist Response: Clinician met with [name] for virtual  therapy session and assessed for safety, sobriety, and medication compliance. Krystena presented for today's appointment on time and was alert, oriented x5, with no evidence or self-report of SI/HI or A/V H.  Jeralynn reported that she continues taking medication responsibly and denied use of alcohol or illicit substances.  Clinician inquired about Ameshia's current emotional ratings, as well as any significant changes in thoughts, feelings, or behaviors since completion of assessment.  Atasha reported scores of 8/10 for depression, 0/10 for anxiety, and 6/10 for anger.  Caci reported that she has been very busy taking care of her husband since he was released from care facility following stroke, and this has led to two panic attacks over past week, stating "He is very demanding, and it sends my anxiety through the roof".  Alan reported that he expects her to be on call to assist and will yell her name "around 100 times a day" for menial tasks.  Clinician utilized a handout on assertive communication skills with Keiyana to aid her in properly expressing her needs in a respectful manner towards her husband while also establishing healthier boundaries in the relationship.  Clinician also assisted Nene in exploring alternative ways for her to communicate with him that do not involve yelling such as having him text requests based upon priority.  Lyndee was receptive to these tips, noting that she would plan to speak with him this week about more effective ways to communicate to avoid triggering her anger, stating "It does irritate me sometimes and could send me into a rage because its like I'm always on call".  Clinician offered to teach [name] mindful breathing meditation today as well to aid her in staying calm when faced  with triggers like this, and she was agreeable to this.  Clinician guided her through process of getting comfortable, achieving relaxed breathing rhythm, and then  focusing upon maintaining this over course of 10 minutes practice while acknowledging any intrusive thoughts/feelings, but then allowing them to pass without rumination.   Intervention was effective, as evidenced by [name] participating in exercise successfully and noting that she was able to focus on breathing and feel more calm.  She stated "That felt really good.  My whole body feels relaxed now and I did have one thought come up, but it went away.  I feel good about todays session and would try that again".  Clinician encouraged Natalin to practice this exercise once per day for 5-10 minutes as part of developing self-care routine and will continue to monitor.                            Plan: Follow up again virtually in 2 weeks.    Diagnosis: Generalized anxiety disorder; Major depressive disorder, recurrent, severe; and PTSD   Shade Flood, LCSW, LCAS 02/10/21

## 2021-02-19 ENCOUNTER — Other Ambulatory Visit (HOSPITAL_COMMUNITY): Payer: Self-pay | Admitting: Psychiatry

## 2021-02-19 DIAGNOSIS — F4310 Post-traumatic stress disorder, unspecified: Secondary | ICD-10-CM

## 2021-02-24 ENCOUNTER — Other Ambulatory Visit: Payer: Self-pay

## 2021-02-24 ENCOUNTER — Ambulatory Visit (INDEPENDENT_AMBULATORY_CARE_PROVIDER_SITE_OTHER): Payer: HMO | Admitting: Licensed Clinical Social Worker

## 2021-02-24 DIAGNOSIS — F411 Generalized anxiety disorder: Secondary | ICD-10-CM | POA: Diagnosis not present

## 2021-02-24 DIAGNOSIS — F4310 Post-traumatic stress disorder, unspecified: Secondary | ICD-10-CM

## 2021-02-24 DIAGNOSIS — F332 Major depressive disorder, recurrent severe without psychotic features: Secondary | ICD-10-CM | POA: Diagnosis not present

## 2021-02-24 NOTE — Progress Notes (Signed)
Virtual Visit via Telephone Note   I connected with [name] on 02/24/21 at 11:05am by telephone and verified that I am speaking with the correct person using two identifiers.   I discussed the limitations, risks, security and privacy concerns of performing an evaluation and management service by telephone and the availability of in person appointments. I also discussed with the patient that there may be a patient responsible charge related to this service. The patient expressed understanding and agreed to proceed.   I discussed the assessment and treatment plan with the patient. The patient was provided an opportunity to ask questions and all were answered. The patient agreed with the plan and demonstrated an understanding of the instructions.   The patient was advised to call back or seek an in-person evaluation if the symptoms worsen or if the condition fails to improve as anticipated.   I provided 45 minutes of non-face-to-face time during this encounter.     Shade Flood, LCSW, LCAS __________________________ THERAPIST PROGRESS NOTE   Session Time: 11:05am - 11:50am  Location: Patient: Patient Home Provider: [facility] [place] [facility]    Participation Level: Active   Behavioral Response: Alert, depressed mood   Type of Therapy:  Individual Therapy   Treatment Goals addressed: Medication management; Anxiety, panic attack, and depression management   Interventions: CBT: coping skills/self-care activities   Summary: [name] is a 60 year old married Caucasian female on disability who presented for therapy session today with diagnoses of Generalized anxiety disorder; Major depressive disorder, recurrent, severe; and PTSD.     Suicidal/Homicidal: None; without plan or intent    Therapist Response: Clinician spoke with [name] via telephone call today for therapy, as she experienced connectivity issues with virtual session that delayed start of conversation.  Clinician  assessed for safety, sobriety, and medication compliance. Renisha spoke in a manner that was alert, oriented x5, with no evidence or self-report of SI/HI or A/V H.  Aiza reported ongoing compliance with medication and denied use of alcohol or illicit substances.  Clinician inquired about Mickelle's emotional ratings today, as well as any significant changes in thoughts, feelings, or behaviors since last check-in.  Caelie reported scores of 5/10 for depression, 4/10 for anxiety, and 7/10 for irritability.  [name] denied any recent panic attacks or outbursts.  [name] reported that her husband has 'eased up a little' on demands since discussing issues with him that had arisen between them following his recent stroke.  She reported that despite this, she hasn't been setting aside adequate time for self-care, including no practice of previous relaxation technique that was taught.  Clinician utilized a handout with [name] on self-care activities/coping skills today to provide her with ideas on things she could include in developing routine.  This list featured examples such as engaging in exercise, doing puzzles, visiting the [place] to find a good book, attending church, socializing with support system, playing games, and more.  Intervention was effective, as evidenced by [name] engaging in discussion on the topic and expressing receptiveness to several of these activities, including visiting a Biomedical engineer to get a historical novel or podcast, finding a church to attend in person to build up support system and increase spiritual support, developing a yoga routine, playing mobile games, gardening, visiting thrift stores, or watching true crime shows.  She stated "This was helpful for giving me some ideas.  I need to be careful about burnout and do more things for myself".  Clinician will continue to monitor.  Plan: Follow up again virtually in 2 weeks.    Diagnosis:  Generalized anxiety disorder; Major depressive disorder, recurrent, severe; and PTSD   Shade Flood, LCSW, LCAS 02/24/21

## 2021-03-04 ENCOUNTER — Telehealth (INDEPENDENT_AMBULATORY_CARE_PROVIDER_SITE_OTHER): Payer: HMO | Admitting: Psychiatry

## 2021-03-04 ENCOUNTER — Other Ambulatory Visit: Payer: Self-pay

## 2021-03-04 ENCOUNTER — Encounter (HOSPITAL_COMMUNITY): Payer: Self-pay | Admitting: Psychiatry

## 2021-03-04 DIAGNOSIS — F331 Major depressive disorder, recurrent, moderate: Secondary | ICD-10-CM | POA: Diagnosis not present

## 2021-03-04 DIAGNOSIS — F411 Generalized anxiety disorder: Secondary | ICD-10-CM | POA: Diagnosis not present

## 2021-03-04 DIAGNOSIS — F4310 Post-traumatic stress disorder, unspecified: Secondary | ICD-10-CM

## 2021-03-04 MED ORDER — LAMOTRIGINE 150 MG PO TABS: 150.0000 mg | ORAL_TABLET | Freq: Two times a day (BID) | ORAL | 2 refills | Status: DC

## 2021-03-04 MED ORDER — ARIPIPRAZOLE 5 MG PO TABS: 5.0000 mg | ORAL_TABLET | Freq: Every day | ORAL | 2 refills | Status: DC

## 2021-03-04 MED ORDER — CLONAZEPAM 1 MG PO TABS: ORAL_TABLET | ORAL | 2 refills | Status: DC

## 2021-03-04 MED ORDER — FLUOXETINE HCL 40 MG PO CAPS: ORAL_CAPSULE | ORAL | 2 refills | Status: DC

## 2021-03-04 MED ORDER — DOXEPIN HCL 75 MG PO CAPS: 75.0000 mg | ORAL_CAPSULE | Freq: Every day | ORAL | 2 refills | Status: DC
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
## 2021-03-11 ENCOUNTER — Other Ambulatory Visit: Payer: Self-pay | Admitting: Internal Medicine

## 2021-03-11 DIAGNOSIS — R921 Mammographic calcification found on diagnostic imaging of breast: Secondary | ICD-10-CM

## 2021-03-29 ENCOUNTER — Other Ambulatory Visit (HOSPITAL_COMMUNITY): Payer: Self-pay | Admitting: Psychiatry

## 2021-03-29 DIAGNOSIS — F411 Generalized anxiety disorder: Secondary | ICD-10-CM

## 2021-03-29 DIAGNOSIS — F331 Major depressive disorder, recurrent, moderate: Secondary | ICD-10-CM

## 2021-04-14 DIAGNOSIS — J441 Chronic obstructive pulmonary disease with (acute) exacerbation: Secondary | ICD-10-CM | POA: Diagnosis not present

## 2021-04-14 DIAGNOSIS — F17210 Nicotine dependence, cigarettes, uncomplicated: Secondary | ICD-10-CM | POA: Diagnosis not present

## 2021-04-14 DIAGNOSIS — R051 Acute cough: Secondary | ICD-10-CM | POA: Diagnosis not present

## 2021-04-14 DIAGNOSIS — J449 Chronic obstructive pulmonary disease, unspecified: Secondary | ICD-10-CM | POA: Diagnosis not present

## 2021-04-14 DIAGNOSIS — R112 Nausea with vomiting, unspecified: Secondary | ICD-10-CM | POA: Diagnosis not present

## 2021-04-14 DIAGNOSIS — I10 Essential (primary) hypertension: Secondary | ICD-10-CM | POA: Diagnosis not present

## 2021-05-22 ENCOUNTER — Other Ambulatory Visit (HOSPITAL_COMMUNITY): Payer: Self-pay | Admitting: Psychiatry

## 2021-05-22 DIAGNOSIS — F331 Major depressive disorder, recurrent, moderate: Secondary | ICD-10-CM

## 2021-05-22 DIAGNOSIS — F411 Generalized anxiety disorder: Secondary | ICD-10-CM

## 2021-05-23 ENCOUNTER — Other Ambulatory Visit (HOSPITAL_COMMUNITY): Payer: Self-pay | Admitting: Psychiatry

## 2021-05-23 DIAGNOSIS — F331 Major depressive disorder, recurrent, moderate: Secondary | ICD-10-CM

## 2021-05-23 DIAGNOSIS — F4310 Post-traumatic stress disorder, unspecified: Secondary | ICD-10-CM

## 2021-05-27 ENCOUNTER — Other Ambulatory Visit (HOSPITAL_COMMUNITY): Payer: Self-pay | Admitting: Psychiatry

## 2021-05-27 DIAGNOSIS — F331 Major depressive disorder, recurrent, moderate: Secondary | ICD-10-CM

## 2021-05-29 DIAGNOSIS — I10 Essential (primary) hypertension: Secondary | ICD-10-CM | POA: Diagnosis not present

## 2021-05-29 DIAGNOSIS — R600 Localized edema: Secondary | ICD-10-CM | POA: Diagnosis not present

## 2021-05-29 DIAGNOSIS — F17210 Nicotine dependence, cigarettes, uncomplicated: Secondary | ICD-10-CM | POA: Diagnosis not present

## 2021-05-29 DIAGNOSIS — E785 Hyperlipidemia, unspecified: Secondary | ICD-10-CM | POA: Diagnosis not present

## 2021-05-29 DIAGNOSIS — N179 Acute kidney failure, unspecified: Secondary | ICD-10-CM | POA: Diagnosis not present

## 2021-05-29 DIAGNOSIS — E041 Nontoxic single thyroid nodule: Secondary | ICD-10-CM | POA: Diagnosis not present

## 2021-05-29 DIAGNOSIS — E1169 Type 2 diabetes mellitus with other specified complication: Secondary | ICD-10-CM | POA: Diagnosis not present

## 2021-05-29 DIAGNOSIS — F319 Bipolar disorder, unspecified: Secondary | ICD-10-CM | POA: Diagnosis not present

## 2021-05-29 DIAGNOSIS — F4321 Adjustment disorder with depressed mood: Secondary | ICD-10-CM | POA: Diagnosis not present

## 2021-05-29 DIAGNOSIS — J449 Chronic obstructive pulmonary disease, unspecified: Secondary | ICD-10-CM | POA: Diagnosis not present

## 2021-06-04 ENCOUNTER — Other Ambulatory Visit: Payer: Self-pay

## 2021-06-04 ENCOUNTER — Encounter (HOSPITAL_COMMUNITY): Payer: Self-pay | Admitting: Psychiatry

## 2021-06-04 ENCOUNTER — Telehealth (INDEPENDENT_AMBULATORY_CARE_PROVIDER_SITE_OTHER): Payer: HMO | Admitting: Psychiatry

## 2021-06-04 VITALS — Wt 204.0 lb

## 2021-06-04 DIAGNOSIS — F4321 Adjustment disorder with depressed mood: Secondary | ICD-10-CM | POA: Diagnosis not present

## 2021-06-04 DIAGNOSIS — F331 Major depressive disorder, recurrent, moderate: Secondary | ICD-10-CM

## 2021-06-04 DIAGNOSIS — Z23 Encounter for immunization: Secondary | ICD-10-CM | POA: Diagnosis not present

## 2021-06-04 DIAGNOSIS — E041 Nontoxic single thyroid nodule: Secondary | ICD-10-CM | POA: Diagnosis not present

## 2021-06-04 DIAGNOSIS — I700 Atherosclerosis of aorta: Secondary | ICD-10-CM | POA: Diagnosis not present

## 2021-06-04 DIAGNOSIS — F4310 Post-traumatic stress disorder, unspecified: Secondary | ICD-10-CM

## 2021-06-04 DIAGNOSIS — F411 Generalized anxiety disorder: Secondary | ICD-10-CM

## 2021-06-04 DIAGNOSIS — F3341 Major depressive disorder, recurrent, in partial remission: Secondary | ICD-10-CM | POA: Diagnosis not present

## 2021-06-04 DIAGNOSIS — E1169 Type 2 diabetes mellitus with other specified complication: Secondary | ICD-10-CM | POA: Diagnosis not present

## 2021-06-04 DIAGNOSIS — F319 Bipolar disorder, unspecified: Secondary | ICD-10-CM | POA: Diagnosis not present

## 2021-06-04 DIAGNOSIS — Z1339 Encounter for screening examination for other mental health and behavioral disorders: Secondary | ICD-10-CM | POA: Diagnosis not present

## 2021-06-04 DIAGNOSIS — E785 Hyperlipidemia, unspecified: Secondary | ICD-10-CM | POA: Diagnosis not present

## 2021-06-04 DIAGNOSIS — J449 Chronic obstructive pulmonary disease, unspecified: Secondary | ICD-10-CM | POA: Diagnosis not present

## 2021-06-04 DIAGNOSIS — N179 Acute kidney failure, unspecified: Secondary | ICD-10-CM | POA: Diagnosis not present

## 2021-06-04 DIAGNOSIS — I10 Essential (primary) hypertension: Secondary | ICD-10-CM | POA: Diagnosis not present

## 2021-06-04 DIAGNOSIS — R82998 Other abnormal findings in urine: Secondary | ICD-10-CM | POA: Diagnosis not present

## 2021-06-04 DIAGNOSIS — Z1331 Encounter for screening for depression: Secondary | ICD-10-CM | POA: Diagnosis not present

## 2021-06-04 DIAGNOSIS — F17210 Nicotine dependence, cigarettes, uncomplicated: Secondary | ICD-10-CM | POA: Diagnosis not present

## 2021-06-04 DIAGNOSIS — Z0000 Encounter for general adult medical examination without abnormal findings: Secondary | ICD-10-CM | POA: Diagnosis not present

## 2021-06-04 MED ORDER — DOXEPIN HCL 100 MG PO CAPS: 100.0000 mg | ORAL_CAPSULE | Freq: Every day | ORAL | 0 refills | Status: DC

## 2021-06-04 MED ORDER — FLUOXETINE HCL 40 MG PO CAPS: ORAL_CAPSULE | ORAL | 0 refills | Status: DC

## 2021-06-04 MED ORDER — ARIPIPRAZOLE 5 MG PO TABS: 5.0000 mg | ORAL_TABLET | Freq: Every day | ORAL | 0 refills | Status: DC

## 2021-06-04 MED ORDER — CLONAZEPAM 1 MG PO TABS: ORAL_TABLET | ORAL | 0 refills | Status: DC
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
## 2021-06-08 ENCOUNTER — Other Ambulatory Visit: Payer: Self-pay | Admitting: Internal Medicine

## 2021-06-08 ENCOUNTER — Other Ambulatory Visit: Payer: Self-pay

## 2021-06-08 ENCOUNTER — Ambulatory Visit (INDEPENDENT_AMBULATORY_CARE_PROVIDER_SITE_OTHER): Payer: HMO | Admitting: Licensed Clinical Social Worker

## 2021-06-08 DIAGNOSIS — F17210 Nicotine dependence, cigarettes, uncomplicated: Secondary | ICD-10-CM

## 2021-06-08 DIAGNOSIS — F4310 Post-traumatic stress disorder, unspecified: Secondary | ICD-10-CM | POA: Diagnosis not present

## 2021-06-08 DIAGNOSIS — E041 Nontoxic single thyroid nodule: Secondary | ICD-10-CM

## 2021-06-08 DIAGNOSIS — F332 Major depressive disorder, recurrent severe without psychotic features: Secondary | ICD-10-CM | POA: Diagnosis not present

## 2021-06-08 DIAGNOSIS — F411 Generalized anxiety disorder: Secondary | ICD-10-CM
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
## 2021-06-10 ENCOUNTER — Ambulatory Visit
Admission: RE | Admit: 2021-06-10 | Discharge: 2021-06-10 | Disposition: A | Discharge status: Home / Self Care | Payer: HMO | Source: Ambulatory Visit | Attending: Internal Medicine | Admitting: Internal Medicine

## 2021-06-10 DIAGNOSIS — E041 Nontoxic single thyroid nodule: Secondary | ICD-10-CM

## 2021-06-15 ENCOUNTER — Other Ambulatory Visit: Payer: Self-pay

## 2021-06-15 ENCOUNTER — Ambulatory Visit (INDEPENDENT_AMBULATORY_CARE_PROVIDER_SITE_OTHER): Payer: HMO | Admitting: Licensed Clinical Social Worker

## 2021-06-15 DIAGNOSIS — F4310 Post-traumatic stress disorder, unspecified: Secondary | ICD-10-CM

## 2021-06-15 DIAGNOSIS — F411 Generalized anxiety disorder: Secondary | ICD-10-CM

## 2021-06-15 DIAGNOSIS — F332 Major depressive disorder, recurrent severe without psychotic features: Secondary | ICD-10-CM | POA: Diagnosis not present

## 2021-06-15 NOTE — Progress Notes (Signed)
Virtual Visit via Telephone Note   I connected with [name] on 06/15/21 at 1:00pm by telephone and verified that I am speaking with the correct person using two identifiers.   I discussed the limitations, risks, security and privacy concerns of performing an evaluation and management service by telephone and the availability of in person appointments. I also discussed with the patient that there may be a patient responsible charge related to this service. The patient expressed understanding and agreed to proceed.   I discussed the assessment and treatment plan with the patient. The patient was provided an opportunity to ask questions and all were answered. The patient agreed with the plan and demonstrated an understanding of the instructions.   The patient was advised to call back or seek an in-person evaluation if the symptoms worsen or if the condition fails to improve as anticipated.   I provided 1 hour of non-face-to-face time during this encounter.     Shade Flood, LCSW, LCAS __________________________ THERAPIST PROGRESS NOTE   Session Time: 1:00pm - 2:00pm    Location: Patient: Patient Home Provider: [facility] [place] [facility]    Participation Level: Active   Behavioral Response: Alert, depressed mood   Type of Therapy:  Individual Therapy   Treatment Goals addressed: Medication management; Anxiety, panic attack, and depression management    Interventions: CBT, psychoeducation on panic attacks, 5-4-3-2-1 grounding    Summary: Angelika Jerrett is a 60 year old married Caucasian female on disability who presented for therapy session today with diagnoses of Generalized anxiety disorder; Major depressive disorder, recurrent, severe; and PTSD.     Suicidal/Homicidal: None; without plan or intent    Therapist Response: Clinician spoke with [name] via telephone call today for therapy, as she remains unable to come in person or access virtual meeting.  Clinician assessed for  safety, sobriety, and medication compliance. Alazne spoke in a manner that was alert, oriented x5, with no evidence or self-report of SI/HI or A/V H.  Akiera reported ongoing compliance with medication and denied use of alcohol or illicit substances.  Clinician inquired about Whittney's emotional ratings today, as well as any significant changes in thoughts, feelings, or behaviors since last check-in.  Delta reported scores of 10/10 for depression, 8/10 for anxiety, and 0/10 for anger/irritability.  Larsen reported that recent successes included going to 2 different parties and having dinner with some neighbors one night as well in order to get out of the house more.  She also reported that she contacted hospice and expects to hear about entry into grief counseling soon.  Ayana reported that one struggle was experiencing a panic attack yesterday when attending a gender reveal party, stating "I didn't know there would be so many people there and I couldn't leave".  Clinician provided psychoeducation on panic attacks with [name], including the various physical and mental symptoms which can appear during an episode, and discussion on external (I.e. people, places and things) and internal (thoughts and feelings) triggers which might influence her anxiety and need to be avoided.  Clinician also offered to teach Aleecia the 5-4-3-2-1 grounding technique today in session to help temporarily distract her from troubling thoughts and feelings should she come into contact with a trigger and need to deescalate.  Meggin was agreeable to this, so clinician guided her through process of scanning her environment and identifying 5 things she could see, 4 things she could touch, 3 things she could hear, 2 she could smell, and 1 thing she could taste.  Interventions were effective, as evidenced by [name] participating in exercise successfully, in addition to identifying warning signs of an impending panic  attack such as shortness of breath, 'butterflies in the stomach', feeling of dread, chest tightness, increased heartrate, irritability, hot flashes, sweating, as well as triggers that cause these symptoms such as proximity to large crowds, indoors, having interactions with strangers, tension between people, and being in an uncomfortable setting for long periods of time.  Jammie reported that she would continue practicing new coping skills so that she can get out of the home without worrying about reoccurrence of panic attacks, stating "I'm glad I pushed myself and went after all".  Clinician will continue to monitor.             Plan: Follow up again virtually in 1 week.    Diagnosis: Generalized anxiety disorder; Major depressive disorder, recurrent, severe; and PTSD   Shade Flood, LCSW, LCAS 06/15/21

## 2021-06-18 DIAGNOSIS — R600 Localized edema: Secondary | ICD-10-CM | POA: Diagnosis not present

## 2021-06-18 DIAGNOSIS — J449 Chronic obstructive pulmonary disease, unspecified: Secondary | ICD-10-CM | POA: Diagnosis not present

## 2021-06-18 DIAGNOSIS — F17210 Nicotine dependence, cigarettes, uncomplicated: Secondary | ICD-10-CM | POA: Diagnosis not present

## 2021-06-18 DIAGNOSIS — I10 Essential (primary) hypertension: Secondary | ICD-10-CM | POA: Diagnosis not present

## 2021-06-23 DIAGNOSIS — I10 Essential (primary) hypertension: Secondary | ICD-10-CM | POA: Diagnosis not present

## 2021-06-23 DIAGNOSIS — E1169 Type 2 diabetes mellitus with other specified complication: Secondary | ICD-10-CM | POA: Diagnosis not present

## 2021-06-23 DIAGNOSIS — R0609 Other forms of dyspnea: Secondary | ICD-10-CM | POA: Diagnosis not present

## 2021-06-23 DIAGNOSIS — R600 Localized edema: Secondary | ICD-10-CM | POA: Diagnosis not present

## 2021-06-23 DIAGNOSIS — F17210 Nicotine dependence, cigarettes, uncomplicated: Secondary | ICD-10-CM | POA: Diagnosis not present

## 2021-06-23 DIAGNOSIS — J449 Chronic obstructive pulmonary disease, unspecified: Secondary | ICD-10-CM | POA: Diagnosis not present

## 2021-06-24 ENCOUNTER — Other Ambulatory Visit: Payer: Self-pay | Admitting: Internal Medicine

## 2021-06-24 DIAGNOSIS — E041 Nontoxic single thyroid nodule: Secondary | ICD-10-CM

## 2021-06-25 ENCOUNTER — Ambulatory Visit
Admission: RE | Admit: 2021-06-25 | Discharge: 2021-06-25 | Disposition: A | Discharge status: Home / Self Care | Payer: HMO | Source: Ambulatory Visit | Attending: Internal Medicine | Admitting: Internal Medicine

## 2021-06-25 ENCOUNTER — Other Ambulatory Visit (HOSPITAL_COMMUNITY): Payer: Self-pay | Admitting: Registered Nurse

## 2021-06-25 ENCOUNTER — Other Ambulatory Visit: Payer: Self-pay

## 2021-06-25 DIAGNOSIS — F17210 Nicotine dependence, cigarettes, uncomplicated: Secondary | ICD-10-CM

## 2021-06-25 DIAGNOSIS — I10 Essential (primary) hypertension: Secondary | ICD-10-CM

## 2021-06-26 ENCOUNTER — Other Ambulatory Visit (HOSPITAL_COMMUNITY): Payer: Self-pay | Admitting: Psychiatry

## 2021-06-26 DIAGNOSIS — F4310 Post-traumatic stress disorder, unspecified: Secondary | ICD-10-CM

## 2021-07-01 ENCOUNTER — Other Ambulatory Visit (HOSPITAL_COMMUNITY): Payer: Self-pay | Admitting: Psychiatry

## 2021-07-01 ENCOUNTER — Telehealth (HOSPITAL_COMMUNITY): Payer: Self-pay | Admitting: *Deleted

## 2021-07-01 ENCOUNTER — Other Ambulatory Visit: Payer: Self-pay

## 2021-07-01 ENCOUNTER — Ambulatory Visit (INDEPENDENT_AMBULATORY_CARE_PROVIDER_SITE_OTHER): Payer: HMO | Admitting: Licensed Clinical Social Worker

## 2021-07-01 ENCOUNTER — Encounter (HOSPITAL_COMMUNITY): Payer: Self-pay | Admitting: Psychiatry

## 2021-07-01 ENCOUNTER — Telehealth (HOSPITAL_BASED_OUTPATIENT_CLINIC_OR_DEPARTMENT_OTHER): Payer: HMO | Admitting: Psychiatry

## 2021-07-01 DIAGNOSIS — F4310 Post-traumatic stress disorder, unspecified: Secondary | ICD-10-CM

## 2021-07-01 DIAGNOSIS — F331 Major depressive disorder, recurrent, moderate: Secondary | ICD-10-CM

## 2021-07-01 DIAGNOSIS — F411 Generalized anxiety disorder: Secondary | ICD-10-CM | POA: Diagnosis not present

## 2021-07-01 DIAGNOSIS — F332 Major depressive disorder, recurrent severe without psychotic features: Secondary | ICD-10-CM

## 2021-07-01 MED ORDER — LAMOTRIGINE 150 MG PO TABS: 150.0000 mg | ORAL_TABLET | Freq: Two times a day (BID) | ORAL | 0 refills | Status: DC

## 2021-07-01 MED ORDER — CLONAZEPAM 1 MG PO TABS: ORAL_TABLET | ORAL | 0 refills | Status: DC

## 2021-07-01 MED ORDER — DOXEPIN HCL 100 MG PO CAPS: 100.0000 mg | ORAL_CAPSULE | Freq: Every day | ORAL | 0 refills | Status: DC

## 2021-07-01 MED ORDER — FLUOXETINE HCL 40 MG PO CAPS: ORAL_CAPSULE | ORAL | 0 refills | Status: DC

## 2021-07-01 MED ORDER — ARIPIPRAZOLE 5 MG PO TABS: 5.0000 mg | ORAL_TABLET | Freq: Every day | ORAL | 0 refills | Status: DC
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
## 2021-07-01 NOTE — Progress Notes (Signed)
Virtual Visit via Telephone Note   I connected with [name] on 07/01/21 at 2:00pm by telephone and verified that I am speaking with the correct person using two identifiers.   I discussed the limitations, risks, security and privacy concerns of performing an evaluation and management service by telephone and the availability of in person appointments. I also discussed with the patient that there may be a patient responsible charge related to this service. The patient expressed understanding and agreed to proceed.   I discussed the assessment and treatment plan with the patient. The patient was provided an opportunity to ask questions and all were answered. The patient agreed with the plan and demonstrated an understanding of the instructions.   The patient was advised to call back or seek an in-person evaluation if the symptoms worsen or if the condition fails to improve as anticipated.   I provided 35 minutes of non-face-to-face time during this encounter.     Christina Flood, LCSW, [name] __________________________ THERAPIST PROGRESS NOTE   Session Time: 2:00pm - 2:35pm    Location: Patient: Patient Home Provider: [facility] [place] Office    Participation Level: Active   Behavioral Response: Alert, depressed mood   Type of Therapy:  Individual Therapy   Treatment Goals addressed: Medication management; Anxiety, panic attack, and depression management    Interventions: CBT, guided imagery   Summary: [name] is a 60 year old married Caucasian female on disability who presented for therapy session today with diagnoses of Generalized anxiety disorder; Major depressive disorder, recurrent, severe; and PTSD.     Suicidal/Homicidal: None; without plan or intent    Therapist Response: [name] spoke with [name] via telephone call today for therapy, as she remains unable to come in person or access virtual session.  [name] assessed for safety, sobriety, and medication  compliance. [name] spoke in a manner that was alert, oriented x5, with no evidence or self-report of SI/HI or A/V H.  [name] reported that she continues taking medication as prescribed and denied use of alcohol or illicit substances.  [name] inquired about [name]'s current emotional ratings, as well as any significant changes in thoughts, feelings, or behaviors since previous check-in.  [name] reported scores of 9/10 for depression, 9/10 for anxiety, and 0/10 for anger/irritability.  [name] denied any recent outbursts.  [name] reported that she could not think of any recent successes, stating "I haven't been out of the house much, so I think I've taken a few steps back".  [name] reported that she is also having panic attacks x2-3 times per week due to worries regarding her financial situation following loss of husband.  She stated "Sometime next month I'll start getting spousal benefits and that will help a lot".  [name] reported that she needs help today keeping her mind off this stressor until then.  [name] offered to teach [name] some grounding techniques today which could be utilized to temporarily distract from distressing thoughts about finances.  [name] declined this offer, preferring to engage in a relaxation activity instead.  [name] offered to perform a guided imagery exercise with various settings to choose from.  [name] was agreeable to this, and reported that she would like one involving a mountain hike.  [name] guided [name] through process of getting comfortable, achieving relaxed breathing pattern, and then performing an initial body scan to identify any areas of tension related to stress retained in the body.  [name] then instructed [name] to begin visualization of walking through a dense forest path, up  a mountain side, and eventually resting at a clearing with a gentle stream and overlook of mountain tops.  [name] included various pleasing  sensory details (i.e. sound of running water from stream, feeling of gentle breeze, smell of pine, etc) throughout activity to enhance experience and increase sense of calm.  [name] inquired about Shenell's experience with activity afterward, including ability to focus on narration, and how this affected her overall emotional state.  Intervention was effective, as evidenced by [name] successfully engaging in activity and reporting that she did not have trouble focusing on the narration, and found that she was more relaxed physically and mentally afterward.  Sibyl reported that she would add this practice to her self-care routine and try to get outside of the home at least once this week to curb excessive isolation.  [name] will continue to monitor.             Plan: Follow up again virtually in 1 week.    Diagnosis: Generalized anxiety disorder; Major depressive disorder, recurrent, severe; and PTSD   Christina Flood, LCSW, [name] 07/01/21

## 2021-07-01 NOTE — Telephone Encounter (Signed)
Spoke with pt to advise that we have samples of Abilify 10 mg available. Nurse advised pt that she will have to cut tablets in half as ordered dose is 5 mg but that tablets are scored and it is safe to cut in half, pt verbalizes understanding.

## 2021-07-13 ENCOUNTER — Ambulatory Visit (HOSPITAL_COMMUNITY): Payer: HMO | Attending: Registered Nurse

## 2021-07-13 ENCOUNTER — Other Ambulatory Visit: Payer: Self-pay

## 2021-07-13 DIAGNOSIS — I10 Essential (primary) hypertension: Secondary | ICD-10-CM | POA: Diagnosis not present

## 2021-07-13 LAB — ECHOCARDIOGRAM COMPLETE
Area-P 1/2: 2.99 cm2
S' Lateral: 2.3 cm

## 2021-07-16 ENCOUNTER — Ambulatory Visit
Admission: RE | Admit: 2021-07-16 | Discharge: 2021-07-16 | Disposition: A | Discharge status: Home / Self Care | Payer: HMO | Source: Ambulatory Visit | Attending: Internal Medicine | Admitting: Internal Medicine

## 2021-07-16 ENCOUNTER — Ambulatory Visit (INDEPENDENT_AMBULATORY_CARE_PROVIDER_SITE_OTHER): Payer: HMO | Admitting: Licensed Clinical Social Worker

## 2021-07-16 ENCOUNTER — Other Ambulatory Visit (HOSPITAL_COMMUNITY)
Admission: RE | Admit: 2021-07-16 | Discharge: 2021-07-16 | Disposition: A | Discharge status: Home / Self Care | Payer: HMO | Source: Ambulatory Visit | Attending: Radiology | Admitting: Radiology

## 2021-07-16 ENCOUNTER — Other Ambulatory Visit: Payer: Self-pay

## 2021-07-16 DIAGNOSIS — F332 Major depressive disorder, recurrent severe without psychotic features: Secondary | ICD-10-CM

## 2021-07-16 DIAGNOSIS — E041 Nontoxic single thyroid nodule: Secondary | ICD-10-CM | POA: Diagnosis not present

## 2021-07-16 DIAGNOSIS — F4310 Post-traumatic stress disorder, unspecified: Secondary | ICD-10-CM | POA: Diagnosis not present

## 2021-07-16 DIAGNOSIS — F411 Generalized anxiety disorder: Secondary | ICD-10-CM | POA: Diagnosis not present

## 2021-07-16 NOTE — Progress Notes (Signed)
Virtual Visit via Telephone Note   I connected with [name] on 07/16/21 at 1:00pm by telephone and verified that I am speaking with the correct person using two identifiers.   I discussed the limitations, risks, security and privacy concerns of performing an evaluation and management service by telephone and the availability of in person appointments. I also discussed with the patient that there may be a patient responsible charge related to this service. The patient expressed understanding and agreed to proceed.   I discussed the assessment and treatment plan with the patient. The patient was provided an opportunity to ask questions and all were answered. The patient agreed with the plan and demonstrated an understanding of the instructions.   The patient was advised to call back or seek an in-person evaluation if the symptoms worsen or if the condition fails to improve as anticipated.   I provided 40 minutes of non-face-to-face time during this encounter.     Shade Flood, LCSW, LCAS __________________________ THERAPIST PROGRESS NOTE   Session Time: 1:00pm - 1:40pm     Location: Patient: Patient Home Provider: [facility] [place] Office    Participation Level: Active   Behavioral Response: Alert, anxious mood   Type of Therapy:  Individual Therapy   Treatment Goals addressed: Medication management; Anxiety, panic attack, and depression management    Interventions: CBT: challenging anxious thoughts    Summary: [name] is a 60 year old married Caucasian female on disability who presented for therapy session today with diagnoses of Generalized anxiety disorder; Major depressive disorder, recurrent, severe; and PTSD.     Suicidal/Homicidal: None; without plan or intent    Therapist Response: Clinician spoke with [name] via telephone call today for therapy, as she remains unable to come in person or access video appointments.  Clinician assessed for safety, sobriety, and  medication compliance. [name] spoke in a manner that was alert, oriented x5, with no evidence or self-report of SI/HI or A/V H.  [name] reported ongoing compliance with medication and denied use of alcohol or illicit substances.  Clinician inquired about [name]'s emotional ratings today, as well as any significant changes in thoughts, feelings, or behaviors since last check-in.  [name] reported scores of 7/10 for depression, 7/10 for anxiety, and 0/10 for anger/irritability.  [name] denied any recent outbursts or panic attacks.  [name] reported that one recent success was attending church yesterday, stating "Its a small place and I enjoyed it.  It put me back in faith with God.  I'll probably be back on Sunday".  She reported that she also made arrangements with social security following the passing of her husband, and expects that the additional income will help with financial stress, stating "Its like a weight was lifted off my shoulders".  [name] reported that she has been worrying excessively about a biopsy appointment tomorrow, since a mass was detected in a recent MRI.  Clinician utilized handout in session today titled "Worry exploration" in order to assist [name] in reducing her anxiety related to upcoming appointment.  This worksheet featured a series of Socratic questions aimed at exploring the most likely outcomes for a situation of concern, rather than focusing on the worst possible outcome (i.e. catastrophizing).  Clinician assisted [name] in identifying and challenging any irrational beliefs related to this worry, in addition to utilizing problem solving approach to explore strategies which would help her accomplish goal of attending appointment and coping with results of biopsy.  [name] actively participated in discussion on handout, reporting that  she is worried that the mass will turn out to be malignant and possibly kill her.  [name] reported that there is  sufficient evidence to suggest she will be okay, including the fact that there have been no physical symptoms that have arisen since detection, the attitude of her doctor, and past success in beating a cancer event.  Intervention was effective, as evidenced by [name] reporting that even if the worst case scenario comes to fruition and this biopsy finds cancer, she feels confident she will be able to advocate for an appropriate treatment, and move on with her life.  [name] stated "This was helpful.  You always calm my nerves down".  Clinician encouraged [name] to consider engaging in healthy self-care activities to fill idle time and avoid too much rumination upon this stressor, which she was agreeable to, stating "I'm thinking about looking into senior activities in my area".   Clinician will continue to monitor.              Plan: Follow up again virtually in 1 week.    Diagnosis: Generalized anxiety disorder; Major depressive disorder, recurrent, severe; and PTSD   Shade Flood, LCSW, LCAS 07/16/21

## 2021-07-17 LAB — CYTOLOGY - NON PAP

## 2021-07-27 DIAGNOSIS — J441 Chronic obstructive pulmonary disease with (acute) exacerbation: Secondary | ICD-10-CM | POA: Diagnosis not present

## 2021-07-27 DIAGNOSIS — J449 Chronic obstructive pulmonary disease, unspecified: Secondary | ICD-10-CM | POA: Diagnosis not present

## 2021-07-27 DIAGNOSIS — E1169 Type 2 diabetes mellitus with other specified complication: Secondary | ICD-10-CM | POA: Diagnosis not present

## 2021-07-27 DIAGNOSIS — F17210 Nicotine dependence, cigarettes, uncomplicated: Secondary | ICD-10-CM | POA: Diagnosis not present

## 2021-08-03 ENCOUNTER — Ambulatory Visit (INDEPENDENT_AMBULATORY_CARE_PROVIDER_SITE_OTHER): Payer: HMO | Admitting: Licensed Clinical Social Worker

## 2021-08-03 ENCOUNTER — Other Ambulatory Visit: Payer: Self-pay

## 2021-08-03 DIAGNOSIS — F4310 Post-traumatic stress disorder, unspecified: Secondary | ICD-10-CM | POA: Diagnosis not present

## 2021-08-03 DIAGNOSIS — F332 Major depressive disorder, recurrent severe without psychotic features: Secondary | ICD-10-CM | POA: Diagnosis not present

## 2021-08-03 DIAGNOSIS — I10 Essential (primary) hypertension: Secondary | ICD-10-CM | POA: Diagnosis not present

## 2021-08-03 DIAGNOSIS — F411 Generalized anxiety disorder: Secondary | ICD-10-CM | POA: Diagnosis not present

## 2021-08-03 DIAGNOSIS — J449 Chronic obstructive pulmonary disease, unspecified: Secondary | ICD-10-CM | POA: Diagnosis not present

## 2021-08-03 DIAGNOSIS — J441 Chronic obstructive pulmonary disease with (acute) exacerbation: Secondary | ICD-10-CM | POA: Diagnosis not present

## 2021-08-03 DIAGNOSIS — F17210 Nicotine dependence, cigarettes, uncomplicated: Secondary | ICD-10-CM | POA: Diagnosis not present

## 2021-08-03 NOTE — Progress Notes (Signed)
Virtual Visit via Telephone Note   I connected with [name] on 08/03/21 at 1:00pm by telephone and verified that I am speaking with the correct person using two identifiers.   I discussed the limitations, risks, security and privacy concerns of performing an evaluation and management service by telephone and the availability of in person appointments. I also discussed with the patient that there may be a patient responsible charge related to this service. The patient expressed understanding and agreed to proceed.   I discussed the assessment and treatment plan with the patient. The patient was provided an opportunity to ask questions and all were answered. The patient agreed with the plan and demonstrated an understanding of the instructions.   The patient was advised to call back or seek an in-person evaluation if the symptoms worsen or if the condition fails to improve as anticipated.   I provided 20 minutes of non-face-to-face time during this encounter.     Christina Flood, LCSW, LCAS __________________________ THERAPIST PROGRESS NOTE   Session Time: 1:00pm - 1:20pm      Location: Patient: Patient Home Provider: [facility] [place] Office    Participation Level: Active   Behavioral Response: Alert, irritable mood   Type of Therapy:  Individual Therapy   Treatment Goals addressed: Medication management; Anxiety, panic attack, and depression management    Interventions: CBT, treatment planning    Summary: [name] is a 60 year old married Caucasian female on disability who presented for therapy session today with diagnoses of Generalized anxiety disorder; Major depressive disorder, recurrent, severe; and PTSD.     Suicidal/Homicidal: None; without plan or intent    Therapist Response: Clinician spoke with [name] via telephone call today for therapy, as she remains unable to come in person or access video sessions.  Clinician assessed for safety, sobriety, and medication  compliance. [name] spoke in a manner that was alert, oriented x5, with no evidence or self-report of SI/HI or A/V H.  [name] reported that she continues taking medication as prescribed and denied use of alcohol or illicit substances.  Clinician inquired about [name]'s current emotional ratings, as well as any significant changes in thoughts, feelings, or behaviors since previous check-in.  [name] reported scores of 7/10 for depression, 7/10 for anxiety, and 9/10 for irritability.  [name] denied any recent outbursts or panic attacks.  [name] reported that one recent success was continuing to attend church x2 per week, stating "I'm really into it".  [name] reported that one struggle has been recovering from pneumonia.  [name] denied having an agenda or pressing issues in mind to discuss for today's session, so clinician recommended updating treatment plan to identify progress towards goals and revisions that may be necessary to accommodate for recent challenges.  She was agreeable to this, so clinician collaborated with [name] to revise treatment plan as follows with her verbal consent: Meet with clinician once per week for virtual therapy to address progress towards goals and any barriers to success; Meet with psychiatrist once per month to update on current efficacy of medication and any changes to dose/regimen needed; Taking medications daily as prescribed to reduce symptoms and increase daily stability/functioning;  Reduce depression from 7/10 in average severity down to 4/10 over next 90 days by setting aside at least 1 hour daily for positive self-care activities;  Reduce average anxiety level from 7/10 down to 4/10 and maintain panic attacks at 0 per week over next 90 days by utilizing 3-5 relaxation skills/grounding skills per day, such  as mindful breathing, progressive muscle relaxation, positive visualizations, 5-4-3-2-1 grounding, etc;  Explore triggers related to anger issues as  well as coping skills to help de-escalate when faced with interpersonal conflict while increasing communication and mutual understanding; Implement 4-5 sleep hygiene techniques in order to increase average nightly rest from 3 hours up to 8 over next 90 days and reduce related fatigue/irritability;  Consider engaging in free virtual support groups/anger management/grief and loss classes available at [facility] over next 90 days in order to increase available support and coping skills;  Continue attending church services and activities x2 per week in order to expand spiritual and community support following recent loss of husband; Voluntarily seek hospitalization should SI/HI appear and safety of self and/or others is determined to be at risk due to development of plan/intent to harm.  Progress is evidenced by [name] reporting that her depression and anxiety levels have continued to decrease since beginning therapy, as well as reduction in panic attacks, and increased use of coping skills, as well as returning to church to expand available support.  [name] also reported progress in keeping up with medication, and appointments through psychiatrist.  [name] reported that her sleep remains poor, and she has not attended community mental health support groups or grief courses that could help processing recent loss of husband.  [name] had to end session early today in order to attend a checkup appointment with her PCP following recent pneumonia diagnosis.  Clinician will continue to monitor.              Plan: Follow up again virtually in 1 week.    Diagnosis: Generalized anxiety disorder; Major depressive disorder, recurrent, severe; and PTSD   Christina Flood, LCSW, LCAS 08/03/21

## 2021-08-07 ENCOUNTER — Telehealth (HOSPITAL_BASED_OUTPATIENT_CLINIC_OR_DEPARTMENT_OTHER): Payer: HMO | Admitting: Psychiatry

## 2021-08-07 ENCOUNTER — Encounter (HOSPITAL_COMMUNITY): Payer: Self-pay | Admitting: Psychiatry

## 2021-08-07 ENCOUNTER — Other Ambulatory Visit: Payer: Self-pay

## 2021-08-07 DIAGNOSIS — F331 Major depressive disorder, recurrent, moderate: Secondary | ICD-10-CM | POA: Diagnosis not present

## 2021-08-07 DIAGNOSIS — F411 Generalized anxiety disorder: Secondary | ICD-10-CM | POA: Diagnosis not present

## 2021-08-07 DIAGNOSIS — F4310 Post-traumatic stress disorder, unspecified: Secondary | ICD-10-CM

## 2021-08-07 MED ORDER — CLONAZEPAM 1 MG PO TABS: ORAL_TABLET | ORAL | 2 refills | Status: DC

## 2021-08-07 MED ORDER — ARIPIPRAZOLE 5 MG PO TABS: 5.0000 mg | ORAL_TABLET | Freq: Every day | ORAL | 2 refills | Status: DC

## 2021-08-07 MED ORDER — DOXEPIN HCL 100 MG PO CAPS: 100.0000 mg | ORAL_CAPSULE | Freq: Every day | ORAL | 2 refills | Status: DC

## 2021-08-07 MED ORDER — FLUOXETINE HCL 40 MG PO CAPS: ORAL_CAPSULE | ORAL | 2 refills | Status: DC
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
## 2021-08-12 ENCOUNTER — Other Ambulatory Visit (HOSPITAL_COMMUNITY): Payer: Self-pay | Admitting: Radiology

## 2021-08-12 DIAGNOSIS — J449 Chronic obstructive pulmonary disease, unspecified: Secondary | ICD-10-CM

## 2021-08-18 ENCOUNTER — Ambulatory Visit (INDEPENDENT_AMBULATORY_CARE_PROVIDER_SITE_OTHER): Payer: HMO | Admitting: Licensed Clinical Social Worker

## 2021-08-18 ENCOUNTER — Other Ambulatory Visit: Payer: Self-pay

## 2021-08-18 DIAGNOSIS — F332 Major depressive disorder, recurrent severe without psychotic features: Secondary | ICD-10-CM

## 2021-08-18 DIAGNOSIS — F4310 Post-traumatic stress disorder, unspecified: Secondary | ICD-10-CM | POA: Diagnosis not present

## 2021-08-18 DIAGNOSIS — F411 Generalized anxiety disorder: Secondary | ICD-10-CM

## 2021-08-18 NOTE — Progress Notes (Signed)
Virtual Visit via Telephone Note   I connected with [name] on 08/18/21 at 3:00pm by telephone and verified that I am speaking with the correct person using two identifiers.   I discussed the limitations, risks, security and privacy concerns of performing an evaluation and management service by telephone and the availability of in person appointments. I also discussed with the patient that there may be a patient responsible charge related to this service. The patient expressed understanding and agreed to proceed.   I discussed the assessment and treatment plan with the patient. The patient was provided an opportunity to ask questions and all were answered. The patient agreed with the plan and demonstrated an understanding of the instructions.   The patient was advised to call back or seek an in-person evaluation if the symptoms worsen or if the condition fails to improve as anticipated.   I provided 37 minutes of non-face-to-face time during this encounter.     Shade Flood, LCSW, LCAS __________________________ THERAPIST PROGRESS NOTE   Session Time: 3:00pm - 3:37pm   Location: Patient: Patient Home Provider: [facility]    Participation Level: Active   Behavioral Response: Alert, depressed mood   Type of Therapy:  Individual Therapy   Treatment Goals addressed: Medication management; Anxiety, panic attack, and depression management   Interventions: CBT: self-care assessment p.1   Summary: Arriyah Madej is a 60 year old married Caucasian female on disability who presented for therapy session today with diagnoses of Generalized anxiety disorder; Major depressive disorder, recurrent, severe; and PTSD.     Suicidal/Homicidal: None; without plan or intent    Therapist Response: Clinician spoke with [name] via telephone call today for therapy, as she remains unable to come in person or access video meetings.  Clinician assessed for safety, sobriety, and  medication compliance. Claudette spoke in a manner that was alert, oriented x5, with no evidence or self-report of SI/HI or A/V H.  Lorrayne reported ongoing compliance with medication and denied use of alcohol or illicit substances.  Clinician inquired about Jatziri's emotional ratings today, as well as any significant changes in thoughts, feelings, or behaviors since last check-in.  Tracia reported scores of 6/10 for depression, 4/10 for anxiety, and 0/10 for anger/irritability.  Nakiea denied any recent outbursts or panic attacks.  Juni reported that one recent struggle was dealing with the thanksgiving holiday, since it brought up unresolved grief regarding her husband's passing.  Laporscha denied following up on grief referrals at this time.  She reported that a recent success was visiting [place] without feeling as overwhelmed in this setting.  Deysy reported that one of her goals at the moment is to "Get out more and do more things".  Clinician discussed topic of self-care today with Ekaterina to assist with this goal and explained how this can be defined as the things one does to maintain good health and improve well-being.  Clinician covered a self-care assessment form with [name] via [employer] application to complete which featured various sub-categories of self-care, including physical, psychological/emotional, social, spiritual, and professional.  Micky was asked to rank her engagement in the activities listed for each dimension on a scale of 1-3, with 1 indicating 'Poor', 2 indicating 'Ok', and 3 indicating 'Well'.  Clinician invited Avia to share results of this assessment, and inquired about which areas of self-care she is doing well in, as well as areas that require attention, and how she plans to begin addressing this during treatment.  Intervention was effective,  as evidenced by [name] participating in assessment, completing initial 2 sections, and reporting that  she is excelling in areas such as maintaining personal hygiene, attending preventative medical appointments, learning new things, and engaging in comforting activities, but she would benefit from focusing on areas such as eating healthy foods, eating regularly, getting more exercise, getting more sleep, exploring hobbies, expressing her feelings, talking about problems, and taking daycations.  Milan reported that she would work to improve these deficits by including more vegetables in her diet, trying to eat 3 meals per day, walking a little each day when physically able, visiting the senior center and church to meet new people and find meaningful activities to engage in with others.  Ellery stated "I definitely need to get out of the house more often.  I've been sick the past 2 weeks but I'm going back to church this week because I've missed it".  Clinician will continue to monitor.              Plan: Follow up again virtually in 1 week.    Diagnosis: Generalized anxiety disorder; Major depressive disorder, recurrent, severe; and PTSD   Shade Flood, LCSW, LCAS 08/18/21

## 2021-08-24 ENCOUNTER — Other Ambulatory Visit: Payer: Self-pay

## 2021-08-24 ENCOUNTER — Ambulatory Visit (INDEPENDENT_AMBULATORY_CARE_PROVIDER_SITE_OTHER): Payer: HMO | Admitting: Licensed Clinical Social Worker

## 2021-08-24 DIAGNOSIS — F4310 Post-traumatic stress disorder, unspecified: Secondary | ICD-10-CM

## 2021-08-24 DIAGNOSIS — F411 Generalized anxiety disorder: Secondary | ICD-10-CM | POA: Diagnosis not present

## 2021-08-24 DIAGNOSIS — F332 Major depressive disorder, recurrent severe without psychotic features: Secondary | ICD-10-CM | POA: Diagnosis not present

## 2021-08-24 NOTE — Progress Notes (Signed)
Virtual Visit via Telephone Note   I connected with [name] on 08/24/21 at 1:00pm by telephone and verified that I am speaking with the correct person using two identifiers.   I discussed the limitations, risks, security and privacy concerns of performing an evaluation and management service by telephone and the availability of in person appointments. I also discussed with the patient that there may be a patient responsible charge related to this service. The patient expressed understanding and agreed to proceed.   I discussed the assessment and treatment plan with the patient. The patient was provided an opportunity to ask questions and all were answered. The patient agreed with the plan and demonstrated an understanding of the instructions.   The patient was advised to call back or seek an in-person evaluation if the symptoms worsen or if the condition fails to improve as anticipated.   I provided 50 minutes of non-face-to-face time during this encounter.     Shade Flood, LCSW, LCAS __________________________ THERAPIST PROGRESS NOTE   Session Time: 1:00pm - 1:50pm   Location: Patient: Patient Home Provider: [facility]    Participation Level: Active   Behavioral Response: Alert, depressed mood   Type of Therapy:  Individual Therapy   Treatment Goals addressed: Medication management; Anxiety, panic attack, and depression management   Interventions: CBT: self-care assessment p.2   Summary: [name] is a 60 year old married Caucasian female on disability who presented for therapy session today with diagnoses of Generalized anxiety disorder; Major depressive disorder, recurrent, severe; and PTSD.     Suicidal/Homicidal: None; without plan or intent    Therapist Response: Clinician spoke with [name] via telephone call today for therapy, as she remains unable to come in person or access video meetings.  Clinician assessed for safety, sobriety, and medication  compliance. [name] spoke in a manner that was alert, oriented x5, with no evidence or self-report of SI/HI or A/V H.  [name] reported that she continues taking medication as prescribed and denied use of alcohol or illicit substances.  Clinician inquired about [name]'s current emotional ratings, as well as any significant changes in thoughts, feelings, or behaviors since previous check-in.  [name] reported scores of 6/10 for depression, 4/10 for anxiety, and 0/10 for anger/irritability.  [name] denied any recent outbursts or panic attacks.  [name] reported that one recent struggle was dealing with the loss of her husband, stating "This is his birthday month so that's been tough".  She reported that she hasn't returned to church or looked into grief/loss support groups or courses through [facility].  Clinician continued discussion upon topic of self-care today with [name] based upon her request for assistance in filling idle time during the week.  Clinician covered self-care assessment form with [name] via [employer] application to complete final sub-categories (i.e. social, and spiritual).  [name] was asked to rank her engagement in the activities listed for each dimension on a scale of 1-3, with 1 indicating 'Poor', 2 indicating 'Ok', and 3 indicating 'Well'.  Clinician invited [name] to share results of this assessment, and inquired about which areas of self-care she is doing well in, as well as areas that require attention, and how she plans to begin addressing this during treatment.  Intervention was effective, as evidenced by [name] successfully completing final sections of assessment and actively engaging in discussion on subject, reporting that she is excelling in areas such as asking others for help when needed, praying daily, and taking time for thought and  reflection, but would benefit from focusing on spending time with people she likes, meeting new people, finding  enjoyable activities to do with others, keeping in touch with old friends, spending time in nature, meditating, and participating in a cause that is important to her.  [name] reported that she would address these self-care deficits by returning to church in order to make meaningful connections with new supports, meditate daily in the morning, visit a park x1 per week, and look into volunteering at a local cancer center to assist patients.  [name] stated "There is a lot of stuff I could be doing with my free time".  Clinician will continue to monitor.            Plan: Follow up again virtually in 1 week.    Diagnosis: Generalized anxiety disorder; Major depressive disorder, recurrent, severe; and PTSD   Shade Flood, LCSW, LCAS 08/24/21

## 2021-08-29 ENCOUNTER — Other Ambulatory Visit (HOSPITAL_COMMUNITY): Payer: Self-pay | Admitting: Psychiatry

## 2021-08-29 DIAGNOSIS — F331 Major depressive disorder, recurrent, moderate: Secondary | ICD-10-CM

## 2021-08-29 DIAGNOSIS — F411 Generalized anxiety disorder: Secondary | ICD-10-CM

## 2021-08-29 DIAGNOSIS — F4310 Post-traumatic stress disorder, unspecified: Secondary | ICD-10-CM

## 2021-10-14 ENCOUNTER — Encounter: Payer: Self-pay | Admitting: Internal Medicine

## 2021-10-18 DIAGNOSIS — I10 Essential (primary) hypertension: Secondary | ICD-10-CM | POA: Diagnosis not present

## 2021-10-18 DIAGNOSIS — J449 Chronic obstructive pulmonary disease, unspecified: Secondary | ICD-10-CM | POA: Diagnosis not present

## 2021-10-18 DIAGNOSIS — E1169 Type 2 diabetes mellitus with other specified complication: Secondary | ICD-10-CM | POA: Diagnosis not present

## 2021-10-18 DIAGNOSIS — E785 Hyperlipidemia, unspecified: Secondary | ICD-10-CM | POA: Diagnosis not present

## 2021-10-22 ENCOUNTER — Ambulatory Visit (INDEPENDENT_AMBULATORY_CARE_PROVIDER_SITE_OTHER): Payer: HMO | Admitting: Licensed Clinical Social Worker

## 2021-10-22 ENCOUNTER — Other Ambulatory Visit: Payer: Self-pay

## 2021-10-22 DIAGNOSIS — F332 Major depressive disorder, recurrent severe without psychotic features: Secondary | ICD-10-CM

## 2021-10-22 DIAGNOSIS — F4310 Post-traumatic stress disorder, unspecified: Secondary | ICD-10-CM | POA: Diagnosis not present

## 2021-10-22 DIAGNOSIS — F411 Generalized anxiety disorder: Secondary | ICD-10-CM

## 2021-10-22 NOTE — Progress Notes (Signed)
Virtual Visit via Telephone Note   I connected with [name] on 10/22/21 at 3:00pm by telephone and verified that I am speaking with the correct person using two identifiers.   I discussed the limitations, risks, security and privacy concerns of performing an evaluation and management service by telephone and the availability of in person appointments. I also discussed with the patient that there may be a patient responsible charge related to this service. The patient expressed understanding and agreed to proceed.   I discussed the assessment and treatment plan with the patient. The patient was provided an opportunity to ask questions and all were answered. The patient agreed with the plan and demonstrated an understanding of the instructions.   The patient was advised to call back or seek an in-person evaluation if the symptoms worsen or if the condition fails to improve as anticipated.   I provided 38 minutes of non-face-to-face time during this encounter.     Christina Flood, LCSW, LCAS __________________________ THERAPIST PROGRESS NOTE   Session Time: 3:00pm - 3:38pm    Location: Patient: Patient Home Provider: [facility]    Participation Level: Active   Behavioral Response: Alert, depressed mood   Type of Therapy:  Individual Therapy   Treatment Goals addressed: Medication management; Anxiety, panic attack, and depression management; Safety planning    Interventions: CBT; safety planning, mindfulness   Summary: [name] is a 61 year old married Caucasian female on disability who presented for therapy session today with diagnoses of Generalized anxiety disorder; Major depressive disorder, recurrent, severe; and PTSD.     Suicidal/Homicidal: Passive SI without plan or intent    Therapist Response: Clinician spoke with [name] via telephone call today for therapy, as she remains unable to come in person or access video meetings.  Clinician assessed for  safety, sobriety, and medication compliance. [name] spoke in a manner that was alert, oriented x5, with no evidence or self-report of HI or A/V H.  [name] reported ongoing compliance with medication and denied use of alcohol or illicit substances.  Clinician inquired about [name]'s emotional ratings today, as well as any significant changes in thoughts, feelings, or behaviors since last check-in.  [name] reported scores of 10/10 for depression, 10/10 for anxiety, and 0/10 for anger/irritability.  [name] denied any recent outbursts, but reported that she is having roughly x2-3 panic attacks a day.  [name] stated A lot of bad things have happened.  I have to move out of my house and live with my mom until I can get my own place.  I'm going to have to surrender my dogs too.  [name] reported that she was experiencing passive SI without intent or plan this morning.  Clinician discussed safety planning measures with [name], including identifying warning signs that a crisis could be building, healthy distraction strategies to engage in, and reliable supports she can outreach if individual coping methods prove ineffective during crisis (see suicide risk patient safety plan in chart).  [name] participated in safety planning process and was able to identify distraction activities such as going online, watching television, or taking a walk, and agreed to speak with her mother for support, call the national suicide hotline, or 911 for transport to the hospital for assessment should she begin to develop intent and/or plan to harm herself.  Clinician also encouraged [name] to consider group therapy as an option to increase available support and learn additional coping skills. Interventions were effective, as evidenced by [name] participating in safety  planning process and reporting that she can contract for safety at this time, and would practice coping skills to distract from any suicidal  thoughts that arise until they pass.  Clinician reviewed practice of 10 minute mindful breathing meditation activity with [name] to add to available coping skills.  [name] participated in activity successfully and stated This helped me relax a little bit.  Its always helpful to talk to you.  She reported that she would focus on exploring independent housing options today to avoid ruminating upon negative thoughts, stating I'm going to apply to [employer].  [name] declined referral for group therapy today, but reported that she would consider returning to church to curb isolation and increase socialization.  Clinician will continue to monitor.            Plan: Follow up again virtually in 1 week.    Diagnosis: Generalized anxiety disorder; Major depressive disorder, recurrent, severe; and PTSD   Christina Flood, LCSW, LCAS 10/22/21

## 2021-11-09 ENCOUNTER — Other Ambulatory Visit (HOSPITAL_COMMUNITY): Payer: Self-pay | Admitting: Psychiatry

## 2021-11-09 DIAGNOSIS — F4310 Post-traumatic stress disorder, unspecified: Secondary | ICD-10-CM

## 2021-11-10 ENCOUNTER — Telehealth (HOSPITAL_BASED_OUTPATIENT_CLINIC_OR_DEPARTMENT_OTHER): Payer: HMO | Admitting: Psychiatry

## 2021-11-10 ENCOUNTER — Encounter (HOSPITAL_COMMUNITY): Payer: Self-pay | Admitting: Psychiatry

## 2021-11-10 ENCOUNTER — Other Ambulatory Visit: Payer: Self-pay

## 2021-11-10 DIAGNOSIS — F331 Major depressive disorder, recurrent, moderate: Secondary | ICD-10-CM

## 2021-11-10 DIAGNOSIS — F4310 Post-traumatic stress disorder, unspecified: Secondary | ICD-10-CM

## 2021-11-10 DIAGNOSIS — F411 Generalized anxiety disorder: Secondary | ICD-10-CM

## 2021-11-10 MED ORDER — FLUOXETINE HCL 40 MG PO CAPS: ORAL_CAPSULE | ORAL | 2 refills | Status: DC

## 2021-11-10 MED ORDER — CLONAZEPAM 1 MG PO TABS: ORAL_TABLET | ORAL | 2 refills | Status: DC

## 2021-11-10 MED ORDER — DOXEPIN HCL 100 MG PO CAPS: 100.0000 mg | ORAL_CAPSULE | Freq: Every day | ORAL | 2 refills | Status: DC

## 2021-11-10 MED ORDER — ARIPIPRAZOLE 5 MG PO TABS: 5.0000 mg | ORAL_TABLET | Freq: Every day | ORAL | 2 refills | Status: DC
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
## 2021-11-19 DIAGNOSIS — S31109A Unspecified open wound of abdominal wall, unspecified quadrant without penetration into peritoneal cavity, initial encounter: Secondary | ICD-10-CM | POA: Diagnosis not present

## 2021-12-01 ENCOUNTER — Other Ambulatory Visit: Payer: Self-pay

## 2021-12-01 ENCOUNTER — Ambulatory Visit (HOSPITAL_COMMUNITY): Admission: EM | Admit: 2021-12-01 | Discharge: 2021-12-01 | Discharge status: Absent without leave | Payer: HMO

## 2021-12-01 DIAGNOSIS — Z5321 Procedure and treatment not carried out due to patient leaving prior to being seen by health care provider: Secondary | ICD-10-CM

## 2021-12-02 ENCOUNTER — Other Ambulatory Visit: Payer: Self-pay | Admitting: Internal Medicine

## 2021-12-02 DIAGNOSIS — J849 Interstitial pulmonary disease, unspecified: Secondary | ICD-10-CM | POA: Diagnosis not present

## 2021-12-02 DIAGNOSIS — I10 Essential (primary) hypertension: Secondary | ICD-10-CM | POA: Diagnosis not present

## 2021-12-02 DIAGNOSIS — F419 Anxiety disorder, unspecified: Secondary | ICD-10-CM | POA: Diagnosis not present

## 2021-12-02 DIAGNOSIS — E041 Nontoxic single thyroid nodule: Secondary | ICD-10-CM | POA: Diagnosis not present

## 2021-12-02 DIAGNOSIS — F3341 Major depressive disorder, recurrent, in partial remission: Secondary | ICD-10-CM | POA: Diagnosis not present

## 2021-12-02 DIAGNOSIS — I700 Atherosclerosis of aorta: Secondary | ICD-10-CM | POA: Diagnosis not present

## 2021-12-02 DIAGNOSIS — F319 Bipolar disorder, unspecified: Secondary | ICD-10-CM | POA: Diagnosis not present

## 2021-12-02 DIAGNOSIS — J449 Chronic obstructive pulmonary disease, unspecified: Secondary | ICD-10-CM | POA: Diagnosis not present

## 2021-12-02 DIAGNOSIS — R1084 Generalized abdominal pain: Secondary | ICD-10-CM | POA: Diagnosis not present

## 2021-12-02 DIAGNOSIS — E1169 Type 2 diabetes mellitus with other specified complication: Secondary | ICD-10-CM | POA: Diagnosis not present

## 2021-12-02 DIAGNOSIS — M797 Fibromyalgia: Secondary | ICD-10-CM | POA: Diagnosis not present

## 2021-12-04 NOTE — ED Provider Notes (Signed)
?[place] ? ? ? ?CSN: [id number] ?Arrival date & time: 12/01/21  1441 ? ? ?  ? ?History   ?Chief Complaint ?No chief complaint on file. ? ? ?HPI ?[name] is a 61 y.o. female.  ? ?Patient left prior to being seen.  ? ? ? ?Past Medical History:  ?Diagnosis Date  ? Acid reflux disease   ? Anxiety   ? Asthma   ? Bronchitis, chronic ([place])   ? Effects worse with URI  ? Chronic pain syndrome   ? COPD (chronic obstructive pulmonary disease) ([place])   ? Depression   ? Diabetes mellitus without complication ([place])   ? borderline, type 2  ? Diverticulitis   ? Dysplasia of cervix (uteri)   ? patient states she had dysplasia of the uterus stage 4  ? Endometrial cancer ([place])   ? Fibromyalgia   ? Fibromyalgia   ? GERD (gastroesophageal reflux disease)   ? Headache(784.0)   ? migraine headache  ? Hyperlipidemia   ? Hypertension   ? Kidney stones   ? at least 6 stones in past  ? Pneumonia 2019  ? Restless leg syndrome   ? Bilateral  ? Vomiting   ? last night at 2300 after last round of antibiotic  ? ? ?Patient Active Problem List  ? Diagnosis Date Noted  ? Diabetes ([place]) 01/09/2019  ? Essential hypertension 12/12/2018  ? Hyperlipidemia 12/12/2018  ? Preoperative clearance 12/12/2018  ? Endometrial cancer ([place]) 11/15/2018  ? Incisional hernia, without obstruction or gangrene 02/04/2014  ? Post-op pain 07/23/2013  ? Postoperative wound infection 05/25/2013  ? Wound disruption, post-op, skin 01/30/2013  ? Preoperative respiratory examination 12/18/2012  ? Smoking 12/18/2012  ? Heme positive stool 11/25/2012  ? Hypokalemia 11/25/2012  ? COPD (chronic obstructive pulmonary disease) ([place])   ? Acid reflux disease   ? Post-operative state 11/21/2012  ? Acute renal failure ([place]) 11/04/2012  ? HAP (hospital-acquired pneumonia) 11/03/2012  ? Acute pulmonary edema ([place]) 11/03/2012  ? Acute respiratory failure with hypoxia ([place]) 11/02/2012  ? Acute exacerbation of COPD with asthma ([place]) 11/02/2012  ? Parastomal hernia  11/02/2012  ? Fibromyalgia 11/02/2012  ? Generalized anxiety disorder 11/02/2012  ? Restless legs syndrome 11/02/2012  ? Obesity (BMI 35.0-39.9 without comorbidity) 11/02/2012  ? Leukocytosis, unspecified 11/02/2012  ? Hyperglycemia 11/02/2012  ? Cigarette nicotine dependence with withdrawal 11/02/2012  ? Complete atelectasis 11/02/2012  ? ? ?Past Surgical History:  ?Procedure Laterality Date  ? ABDOMINAL HYSTERECTOMY    ? APPENDECTOMY    ? APPLICATION OF WOUND VAC N/A 11/01/2012  ? Procedure: APPLICATION OF WOUND VAC;  Surgeon: Joyice Faster. Cornett, MD;  Location: [facility];  Service: General;  Laterality: N/A;  ? CHOLECYSTECTOMY    ? COLONOSCOPY  2013  ? COLOSTOMY    ? COLOSTOMY CLOSURE N/A 11/01/2012  ? Procedure: COLOSTOMY CLOSURE;  Surgeon: Joyice Faster. Cornett, MD;  Location: [facility];  Service: General;  Laterality: N/A;  REPAIR PARASTOMAL HERNIA; REVISION OF COLOSTOMY  ? COLOSTOMY CLOSURE N/A 05/03/2013  ? Procedure: COLOSTOMY CLOSURE;  Surgeon: Joyice Faster. Cornett, MD;  Location: [facility];  Service: General;  Laterality: N/A;  ? DILATION AND CURETTAGE OF UTERUS N/A 05/24/2019  ? Procedure: DILATATION AND CURETTAGE;  Surgeon: Gery Pray, MD;  Location: [facility];  Service: Gynecology;  Laterality: N/A;  ? ESOPHAGOGASTRODUODENOSCOPY Left 11/27/2012  ? Procedure: ESOPHAGOGASTRODUODENOSCOPY (EGD);  Surgeon: Winfield Cunas., MD;  Location: [facility];  Service: Gastroenterology;  Laterality: Left;  ?  INCISIONAL HERNIA REPAIR N/A 11/01/2012  ? Procedure: HERNIA REPAIR INCISIONAL;  Surgeon: Joyice Faster. Cornett, MD;  Location: [facility];  Service: General;  Laterality: N/A;  ? LAPAROTOMY  11/01/2012  ? Procedure: EXPLORATORY LAPAROTOMY;  Surgeon: Joyice Faster. Cornett, MD;  Location: [facility];  Service: General;;  exploratory laparotomy,repair of parastomal hernia and incisional hernia, lysis of adhesions, and application of wound V.A.C.  ? LYSIS OF ADHESION N/A 11/01/2012  ? Procedure: LYSIS OF ADHESION;  Surgeon: Joyice Faster. Cornett, MD;   [facility];  Service: General;  Laterality: N/A;  ? OPERATIVE ULTRASOUND N/A 06/14/2019  ? Procedure: OPERATIVE ULTRASOUND;  Surgeon: Gery Pray, MD;  Location: [facility];  Service: Urology;  Laterality: N/A;  ? PARASTOMAL HERNIA REPAIR N/A 11/01/2012  ? Procedure: HERNIA REPAIR PARASTOMAL;  Surgeon: Joyice Faster. Cornett, MD;  [facility];  Service: General;  Laterality: N/A;  ? PARTIAL HYSTERECTOMY    ? partial  ? TANDEM RING INSERTION N/A 05/24/2019  ? Procedure: INSERTION OF HYMEN CAPSULE;  Surgeon: Gery Pray, MD;  [facility];  Service: Gynecology;  Laterality: N/A;  ? TANDEM RING INSERTION N/A 06/14/2019  ? Procedure: HYMEN CAPSULE PLACEMENT;  Surgeon: Gery Pray, MD;  [facility];  Service: Urology;  Laterality: N/A;  ? ? ?OB History   ?No obstetric history on file. ?  ? ? ? ?Home Medications   ? ?Prior to Admission medications   ?Medication Sig Start Date End Date Taking? Authorizing Provider  ?ARIPiprazole (ABILIFY) 5 MG tablet Take 1 tablet (5 mg total) by mouth daily. 11/10/21   Arfeen, Arlyce Harman, MD  ?atorvastatin (LIPITOR) 40 MG tablet Take 40 mg by mouth at bedtime.  01/17/14   [provider]  ?clonazePAM [place]bye [place]) 1 MG tablet Take 1 tab twice daily 11/10/21   Arfeen, Arlyce Harman, MD  ?dicyclomine (BENTYL) 10 MG capsule Take 10 mg by mouth every 8 (eight) hours as needed for spasms (abdominal spasms).     [provider]  ?doxepin (SINEQUAN) 100 MG capsule Take 1 capsule (100 mg total) by mouth at bedtime. 11/10/21 11/10/22  Arfeen, Arlyce Harman, MD  ?FLUoxetine (PROZAC) 40 MG capsule TAKE 1 CAPSULE BY MOUTH EVERY DAY 11/10/21   Arfeen, Arlyce Harman, MD  ?furosemide (LASIX) 20 MG tablet Take 20 mg by mouth daily as needed. 01/17/21   [provider]  ?lamoTRIgine (LAMICTAL) 150 MG tablet Take 1 tablet (150 mg total) by mouth 2 (two) times daily. ?Patient not taking: Reported on 08/07/2021 07/01/21   Kathlee Nations, MD  ?lisinopril (PRINIVIL,ZESTRIL) 20 MG tablet Take 20 mg by mouth  daily.  06/15/17   [provider]  ?metFORMIN (GLUCOPHAGE) 1000 MG tablet Take 1,000 mg by mouth 2 (two) times daily with a meal.  ?Patient not taking: Reported on 04/16/2020 01/03/14   [provider]  ?morphine (MS CONTIN) 15 MG 12 hr tablet Take 1 tablet (15 mg total) by mouth every 12 (twelve) hours. ?Patient not taking: Reported on 11/22/2019 06/14/19   Gery Pray, MD  ?omeprazole (PRILOSEC) 40 MG capsule Take 40 mg by mouth daily before breakfast.  06/07/17   [provider]  ?Roma Schanz test strip CHECK SUGARS TWICE A DAY DX  DM2, INSULIN DEPENDANT 05/15/19   [provider]  ?prochlorperazine (COMPAZINE) 10 MG tablet Take 1 tablet (10 mg total) by mouth every 6 (six) hours as needed for nausea or vomiting. ?Patient not taking: Reported on 03/04/2021 06/14/19   Gery Pray, MD  ?  VENTOLIN HFA 108 (90 BASE) MCG/ACT inhaler Inhale 2 puffs into the lungs every 6 (six) hours as needed for wheezing or shortness of breath.  07/05/13   [provider]  ? ? ?Family History ?Family History  ?Problem Relation Age of Onset  ? Diabetes Mother   ? Throat cancer Mother   ? Uterine cancer Sister   ? Deep vein thrombosis Neg Hx   ? Pulmonary embolism Neg Hx   ? ? ?Social History ?Social History  ? ?Tobacco Use  ? Smoking status: Former  ?  Packs/day: 0.50  ?  Types: Cigarettes  ?  Quit date: 2010  ?  Years since quitting: 13.2  ? Smokeless tobacco: Never  ? Tobacco comments:  ?  about one pack a day on a bad day  ?Vaping Use  ? Vaping Use: Never used  ?Substance Use Topics  ? Alcohol use: No  ? Drug use: No  ? ? ? ?Allergies   ?Tramadol ? ? ?Review of Systems ?Review of Systems ? ? ?Physical Exam ?Triage Vital Signs ?ED Triage Vitals  ?Enc Vitals Group  ?   BP   ?   Pulse   ?   Resp   ?   Temp   ?   Temp src   ?   SpO2   ?   Weight   ?   Height   ?   Head Circumference   ?   Peak Flow   ?   Pain Score   ?   Pain Loc   ?   Pain Edu?   ?   Excl. in [place]?   ? ?No data found. ? ?Updated  Vital Signs ?LMP 04/20/2013  ? ?Visual Acuity ?Right Eye Distance:   ?Left Eye Distance:   ?Bilateral Distance:   ? ?Right Eye Near:   ?Left Eye Near:    ?Bilateral Near:    ? ?Physical Exam ? ? ?UC Treatments /

## 2022-01-05 DIAGNOSIS — J45909 Unspecified asthma, uncomplicated: Secondary | ICD-10-CM | POA: Diagnosis not present

## 2022-01-05 DIAGNOSIS — I700 Atherosclerosis of aorta: Secondary | ICD-10-CM | POA: Diagnosis not present

## 2022-01-05 DIAGNOSIS — E041 Nontoxic single thyroid nodule: Secondary | ICD-10-CM | POA: Diagnosis not present

## 2022-01-05 DIAGNOSIS — G4700 Insomnia, unspecified: Secondary | ICD-10-CM | POA: Diagnosis not present

## 2022-01-05 DIAGNOSIS — F4310 Post-traumatic stress disorder, unspecified: Secondary | ICD-10-CM | POA: Diagnosis not present

## 2022-01-05 DIAGNOSIS — F17210 Nicotine dependence, cigarettes, uncomplicated: Secondary | ICD-10-CM | POA: Diagnosis not present

## 2022-01-05 DIAGNOSIS — F411 Generalized anxiety disorder: Secondary | ICD-10-CM | POA: Diagnosis not present

## 2022-01-05 DIAGNOSIS — J449 Chronic obstructive pulmonary disease, unspecified: Secondary | ICD-10-CM | POA: Diagnosis not present

## 2022-01-05 DIAGNOSIS — E785 Hyperlipidemia, unspecified: Secondary | ICD-10-CM | POA: Diagnosis not present

## 2022-01-05 DIAGNOSIS — F319 Bipolar disorder, unspecified: Secondary | ICD-10-CM | POA: Diagnosis not present

## 2022-01-05 DIAGNOSIS — I10 Essential (primary) hypertension: Secondary | ICD-10-CM | POA: Diagnosis not present

## 2022-01-06 ENCOUNTER — Ambulatory Visit
Admission: RE | Admit: 2022-01-06 | Discharge: 2022-01-06 | Disposition: A | Discharge status: Home / Self Care | Payer: HMO | Source: Ambulatory Visit | Attending: Internal Medicine | Admitting: Internal Medicine

## 2022-01-06 DIAGNOSIS — I288 Other diseases of pulmonary vessels: Secondary | ICD-10-CM | POA: Diagnosis not present

## 2022-01-06 DIAGNOSIS — J849 Interstitial pulmonary disease, unspecified: Secondary | ICD-10-CM

## 2022-01-06 DIAGNOSIS — I2510 Atherosclerotic heart disease of native coronary artery without angina pectoris: Secondary | ICD-10-CM | POA: Diagnosis not present

## 2022-01-06 DIAGNOSIS — J439 Emphysema, unspecified: Secondary | ICD-10-CM | POA: Diagnosis not present

## 2022-01-06 DIAGNOSIS — J84112 Idiopathic pulmonary fibrosis: Secondary | ICD-10-CM | POA: Diagnosis not present

## 2022-01-14 ENCOUNTER — Inpatient Hospital Stay: Admission: RE | Admit: 2022-01-14 | Payer: HMO | Source: Ambulatory Visit

## 2022-01-17 DIAGNOSIS — J449 Chronic obstructive pulmonary disease, unspecified: Secondary | ICD-10-CM | POA: Diagnosis not present

## 2022-01-17 DIAGNOSIS — E785 Hyperlipidemia, unspecified: Secondary | ICD-10-CM | POA: Diagnosis not present

## 2022-01-17 DIAGNOSIS — I10 Essential (primary) hypertension: Secondary | ICD-10-CM | POA: Diagnosis not present

## 2022-02-04 ENCOUNTER — Telehealth (HOSPITAL_BASED_OUTPATIENT_CLINIC_OR_DEPARTMENT_OTHER): Payer: HMO | Admitting: Psychiatry

## 2022-02-04 ENCOUNTER — Encounter (HOSPITAL_COMMUNITY): Payer: Self-pay | Admitting: Psychiatry

## 2022-02-04 DIAGNOSIS — F4310 Post-traumatic stress disorder, unspecified: Secondary | ICD-10-CM

## 2022-02-04 DIAGNOSIS — F411 Generalized anxiety disorder: Secondary | ICD-10-CM

## 2022-02-04 DIAGNOSIS — F331 Major depressive disorder, recurrent, moderate: Secondary | ICD-10-CM | POA: Diagnosis not present

## 2022-02-04 MED ORDER — FLUOXETINE HCL 40 MG PO CAPS: ORAL_CAPSULE | ORAL | 2 refills | Status: DC

## 2022-02-04 MED ORDER — DOXEPIN HCL 100 MG PO CAPS: 100.0000 mg | ORAL_CAPSULE | Freq: Every day | ORAL | 2 refills | Status: DC

## 2022-02-04 MED ORDER — ARIPIPRAZOLE 5 MG PO TABS: 5.0000 mg | ORAL_TABLET | Freq: Every day | ORAL | 2 refills | Status: DC

## 2022-02-04 MED ORDER — CLONAZEPAM 1 MG PO TABS: ORAL_TABLET | ORAL | 2 refills | Status: DC
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
## 2022-02-08 ENCOUNTER — Telehealth (HOSPITAL_COMMUNITY): Payer: HMO | Admitting: Psychiatry

## 2022-02-24 ENCOUNTER — Institutional Professional Consult (permissible substitution): Payer: HMO | Admitting: Pulmonary Disease

## 2022-02-28 ENCOUNTER — Other Ambulatory Visit (HOSPITAL_COMMUNITY): Payer: Self-pay | Admitting: Psychiatry

## 2022-02-28 DIAGNOSIS — F331 Major depressive disorder, recurrent, moderate: Secondary | ICD-10-CM

## 2022-02-28 DIAGNOSIS — F411 Generalized anxiety disorder: Secondary | ICD-10-CM

## 2022-03-10 ENCOUNTER — Other Ambulatory Visit (HOSPITAL_COMMUNITY): Payer: Self-pay | Admitting: Psychiatry

## 2022-03-10 DIAGNOSIS — F4310 Post-traumatic stress disorder, unspecified: Secondary | ICD-10-CM

## 2022-03-11 ENCOUNTER — Institutional Professional Consult (permissible substitution): Payer: HMO | Admitting: Pulmonary Disease

## 2022-03-11 NOTE — Progress Notes (Deleted)
Synopsis: Referred in June 2023 for abnormal chest imaging by Marton Redwood, MD  Subjective:   PATIENT ID: [name] GENDER: female DOB: [birth year], MRN: [id number]   HPI  No chief complaint on file.  [name] is a 61 year old woman, former smoker with GERD, DMII, and hypertension who is referred to pulmonary clinic for COPD.    Past Medical History:  Diagnosis Date   Acid reflux disease    Anxiety    Asthma    Bronchitis, chronic (H[facility])    Effects worse with URI   Chronic pain syndrome    COPD (chronic obstructive pulmonary disease) ([facility])    Depression    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, type 2   Diverticulitis    Dysplasia of cervix (uteri)    patient states she had dysplasia of the uterus stage 4   Endometrial cancer ([facility])    Fibromyalgia    Fibromyalgia    GERD (gastroesophageal reflux disease)    Headache(784.0)    migraine headache   Hyperlipidemia    Hypertension    Kidney stones    at least 6 stones in past   Pneumonia 2019   Restless leg syndrome    Bilateral   Vomiting    last night at 2300 after last round of antibiotic     Family History  Problem Relation Age of Onset   Diabetes Mother    Throat cancer Mother    Uterine cancer Sister    Deep vein thrombosis Neg Hx    Pulmonary embolism Neg Hx      Social History   Socioeconomic History   Marital status: Married    Spouse name: Not on file   Number of children: Not on file   Years of education: Not on file   Highest education level: Not on file  Occupational History   Not on file  Tobacco Use   Smoking status: Former    Packs/day: 0.50    Types: Cigarettes    Quit date: 2010    Years since quitting: 13.4   Smokeless tobacco: Never   Tobacco comments:    about one pack a day on a bad day  Vaping Use   Vaping Use: Never used  Substance and Sexual Activity   Alcohol use: No   Drug use: No   Sexual activity: Never    Birth  control/protection: None  Other Topics Concern   Not on file  Social History Narrative   Not on file   Social Determinants of Health   Financial Resource Strain: Not on file  Food Insecurity: No Food Insecurity (04/16/2020)   Hunger Vital Sign    Worried About Running Out of Food in the Last Year: Never true    Ran Out of Food in the Last Year: Never true  Transportation Needs: No Transportation Needs (04/16/2020)   PRAPARE - Hydrologist (Medical): No    Lack of Transportation (Non-Medical): No  Physical Activity: Not on file  Stress: Not on file  Social Connections: Not on file  Intimate Partner Violence: Not on file     Allergies  Allergen Reactions   Tramadol     Per Dr. Sharyon Medicus is renal failure, patient states that she is currently taking this medication and she has not had a reaction to Tramadol since this time.     Outpatient Medications Prior to Visit  Medication Sig Dispense Refill   ARIPiprazole (  ABILIFY) 5 MG tablet Take 1 tablet (5 mg total) by mouth daily. 30 tablet 2   atorvastatin (LIPITOR) 40 MG tablet Take 40 mg by mouth at bedtime.      clonazePAM (KLONOPIN) 1 MG tablet Take 1 tab twice daily 60 tablet 2   dicyclomine (BENTYL) 10 MG capsule Take 10 mg by mouth every 8 (eight) hours as needed for spasms (abdominal spasms).      doxepin (SINEQUAN) 100 MG capsule Take 1 capsule (100 mg total) by mouth at bedtime. 30 capsule 2   FLUoxetine (PROZAC) 40 MG capsule TAKE 1 CAPSULE BY MOUTH EVERY DAY 30 capsule 2   furosemide (LASIX) 20 MG tablet Take 20 mg by mouth daily as needed.     lamoTRIgine (LAMICTAL) 150 MG tablet Take 1 tablet (150 mg total) by mouth 2 (two) times daily. (Patient not taking: Reported on 08/07/2021) 60 tablet 0   lisinopril (PRINIVIL,ZESTRIL) 20 MG tablet Take 20 mg by mouth daily.   3   metFORMIN (GLUCOPHAGE) 1000 MG tablet Take 1,000 mg by mouth 2 (two) times daily with a meal.  (Patient not taking:  Reported on 04/16/2020)     morphine (MS CONTIN) 15 MG 12 hr tablet Take 1 tablet (15 mg total) by mouth every 12 (twelve) hours. (Patient not taking: Reported on 11/22/2019) 60 tablet 0   omeprazole (PRILOSEC) 40 MG capsule Take 40 mg by mouth daily before breakfast.   6   ONETOUCH VERIO test strip CHECK SUGARS TWICE A DAY DX  DM2, INSULIN DEPENDANT     prochlorperazine (COMPAZINE) 10 MG tablet Take 1 tablet (10 mg total) by mouth every 6 (six) hours as needed for nausea or vomiting. (Patient not taking: Reported on 03/04/2021) 30 tablet 1   VENTOLIN HFA 108 (90 BASE) MCG/ACT inhaler Inhale 2 puffs into the lungs every 6 (six) hours as needed for wheezing or shortness of breath.      No facility-administered medications prior to visit.    ROS    Objective:  There were no vitals filed for this visit.   Physical Exam    CBC    Component Value Date/Time   WBC 5.5 11/22/2019 1350   WBC 5.7 06/11/2019 1441   RBC 4.24 11/22/2019 1350   HGB 11.6 (L) 11/22/2019 1350   HCT 37.6 11/22/2019 1350   PLT 269 11/22/2019 1350   MCV 88.7 11/22/2019 1350   MCH 27.4 11/22/2019 1350   MCHC 30.9 11/22/2019 1350   RDW 15.6 (H) 11/22/2019 1350   LYMPHSABS 0.7 11/22/2019 1350   MONOABS 0.5 11/22/2019 1350   EOSABS 0.1 11/22/2019 1350   BASOSABS 0.0 11/22/2019 1350     Chest imaging:  PFT:     No data to display          Labs:  Path:  Echo:  Heart Catheterization:       Assessment & Plan:   No diagnosis found.  Discussion: ***    Current Outpatient Medications:    ARIPiprazole (ABILIFY) 5 MG tablet, Take 1 tablet (5 mg total) by mouth daily., Disp: 30 tablet, Rfl: 2   atorvastatin (LIPITOR) 40 MG tablet, Take 40 mg by mouth at bedtime. , Disp: , Rfl:    clonazePAM (KLONOPIN) 1 MG tablet, Take 1 tab twice daily, Disp: 60 tablet, Rfl: 2   dicyclomine (BENTYL) 10 MG capsule, Take 10 mg by mouth every 8 (eight) hours as needed for spasms (abdominal spasms). , Disp: , Rfl:  doxepin (SINEQUAN) 100 MG capsule, Take 1 capsule (100 mg total) by mouth at bedtime., Disp: 30 capsule, Rfl: 2   FLUoxetine (PROZAC) 40 MG capsule, TAKE 1 CAPSULE BY MOUTH EVERY DAY, Disp: 30 capsule, Rfl: 2   furosemide (LASIX) 20 MG tablet, Take 20 mg by mouth daily as needed., Disp: , Rfl:    lamoTRIgine (LAMICTAL) 150 MG tablet, Take 1 tablet (150 mg total) by mouth 2 (two) times daily. (Patient not taking: Reported on 08/07/2021), Disp: 60 tablet, Rfl: 0   lisinopril (PRINIVIL,ZESTRIL) 20 MG tablet, Take 20 mg by mouth daily. , Disp: , Rfl: 3   metFORMIN (GLUCOPHAGE) 1000 MG tablet, Take 1,000 mg by mouth 2 (two) times daily with a meal.  (Patient not taking: Reported on 04/16/2020), Disp: , Rfl:    morphine (MS CONTIN) 15 MG 12 hr tablet, Take 1 tablet (15 mg total) by mouth every 12 (twelve) hours. (Patient not taking: Reported on 11/22/2019), Disp: 60 tablet, Rfl: 0   omeprazole (PRILOSEC) 40 MG capsule, Take 40 mg by mouth daily before breakfast. , Disp: , Rfl: 6   ONETOUCH VERIO test strip, CHECK SUGARS TWICE A DAY DX  DM2, INSULIN DEPENDANT, Disp: , Rfl:    prochlorperazine (COMPAZINE) 10 MG tablet, Take 1 tablet (10 mg total) by mouth every 6 (six) hours as needed for nausea or vomiting. (Patient not taking: Reported on 03/04/2021), Disp: 30 tablet, Rfl: 1   VENTOLIN HFA 108 (90 BASE) MCG/ACT inhaler, Inhale 2 puffs into the lungs every 6 (six) hours as needed for wheezing or shortness of breath. , Disp: , Rfl:

## 2022-04-19 DIAGNOSIS — E785 Hyperlipidemia, unspecified: Secondary | ICD-10-CM | POA: Diagnosis not present

## 2022-04-19 DIAGNOSIS — E1169 Type 2 diabetes mellitus with other specified complication: Secondary | ICD-10-CM | POA: Diagnosis not present

## 2022-04-19 DIAGNOSIS — J449 Chronic obstructive pulmonary disease, unspecified: Secondary | ICD-10-CM | POA: Diagnosis not present

## 2022-04-19 DIAGNOSIS — I10 Essential (primary) hypertension: Secondary | ICD-10-CM | POA: Diagnosis not present

## 2022-05-06 ENCOUNTER — Telehealth (HOSPITAL_BASED_OUTPATIENT_CLINIC_OR_DEPARTMENT_OTHER): Payer: HMO | Admitting: Psychiatry

## 2022-05-06 ENCOUNTER — Encounter (HOSPITAL_COMMUNITY): Payer: Self-pay | Admitting: Psychiatry

## 2022-05-06 ENCOUNTER — Other Ambulatory Visit (HOSPITAL_COMMUNITY): Payer: Self-pay | Admitting: *Deleted

## 2022-05-06 DIAGNOSIS — F331 Major depressive disorder, recurrent, moderate: Secondary | ICD-10-CM

## 2022-05-06 DIAGNOSIS — F4310 Post-traumatic stress disorder, unspecified: Secondary | ICD-10-CM | POA: Diagnosis not present

## 2022-05-06 DIAGNOSIS — F411 Generalized anxiety disorder: Secondary | ICD-10-CM

## 2022-05-06 DIAGNOSIS — Z79899 Other long term (current) drug therapy: Secondary | ICD-10-CM

## 2022-05-06 MED ORDER — CLONAZEPAM 1 MG PO TABS: ORAL_TABLET | ORAL | 2 refills | Status: DC

## 2022-05-06 MED ORDER — DOXEPIN HCL 100 MG PO CAPS: 100.0000 mg | ORAL_CAPSULE | Freq: Every day | ORAL | 2 refills | Status: DC

## 2022-05-06 MED ORDER — ARIPIPRAZOLE 5 MG PO TABS: 5.0000 mg | ORAL_TABLET | Freq: Every day | ORAL | 2 refills | Status: DC

## 2022-05-06 MED ORDER — FLUOXETINE HCL 40 MG PO CAPS: ORAL_CAPSULE | ORAL | 2 refills | Status: DC
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
## 2022-05-10 ENCOUNTER — Inpatient Hospital Stay (HOSPITAL_COMMUNITY): Admission: RE | Admit: 2022-05-10 | Payer: HMO | Source: Ambulatory Visit

## 2022-05-17 ENCOUNTER — Ambulatory Visit (INDEPENDENT_AMBULATORY_CARE_PROVIDER_SITE_OTHER): Payer: HMO | Admitting: Licensed Clinical Social Worker

## 2022-05-17 DIAGNOSIS — F4310 Post-traumatic stress disorder, unspecified: Secondary | ICD-10-CM

## 2022-05-17 DIAGNOSIS — F332 Major depressive disorder, recurrent severe without psychotic features: Secondary | ICD-10-CM

## 2022-05-17 DIAGNOSIS — F411 Generalized anxiety disorder: Secondary | ICD-10-CM
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
## 2022-06-15 DIAGNOSIS — E785 Hyperlipidemia, unspecified: Secondary | ICD-10-CM | POA: Diagnosis not present

## 2022-06-15 DIAGNOSIS — E1169 Type 2 diabetes mellitus with other specified complication: Secondary | ICD-10-CM | POA: Diagnosis not present

## 2022-06-15 DIAGNOSIS — I10 Essential (primary) hypertension: Secondary | ICD-10-CM | POA: Diagnosis not present

## 2022-06-15 DIAGNOSIS — E041 Nontoxic single thyroid nodule: Secondary | ICD-10-CM | POA: Diagnosis not present

## 2022-06-15 DIAGNOSIS — F419 Anxiety disorder, unspecified: Secondary | ICD-10-CM | POA: Diagnosis not present

## 2022-06-15 DIAGNOSIS — R7989 Other specified abnormal findings of blood chemistry: Secondary | ICD-10-CM | POA: Diagnosis not present

## 2022-06-18 ENCOUNTER — Other Ambulatory Visit: Payer: Self-pay | Admitting: Internal Medicine

## 2022-06-18 DIAGNOSIS — R82998 Other abnormal findings in urine: Secondary | ICD-10-CM | POA: Diagnosis not present

## 2022-06-18 DIAGNOSIS — E041 Nontoxic single thyroid nodule: Secondary | ICD-10-CM

## 2022-06-21 ENCOUNTER — Other Ambulatory Visit: Payer: Self-pay | Admitting: Internal Medicine

## 2022-06-21 DIAGNOSIS — R921 Mammographic calcification found on diagnostic imaging of breast: Secondary | ICD-10-CM

## 2022-06-22 ENCOUNTER — Ambulatory Visit
Admission: RE | Admit: 2022-06-22 | Discharge: 2022-06-22 | Disposition: A | Discharge status: Home / Self Care | Payer: HMO | Source: Ambulatory Visit | Attending: Internal Medicine | Admitting: Internal Medicine

## 2022-06-22 DIAGNOSIS — E041 Nontoxic single thyroid nodule: Secondary | ICD-10-CM | POA: Diagnosis not present

## 2022-06-24 ENCOUNTER — Other Ambulatory Visit (HOSPITAL_COMMUNITY): Payer: Self-pay | Admitting: Radiology

## 2022-06-24 DIAGNOSIS — J849 Interstitial pulmonary disease, unspecified: Secondary | ICD-10-CM

## 2022-07-23 ENCOUNTER — Inpatient Hospital Stay (HOSPITAL_COMMUNITY): Payer: HMO

## 2022-07-23 ENCOUNTER — Emergency Department (HOSPITAL_COMMUNITY): Payer: HMO

## 2022-07-23 ENCOUNTER — Inpatient Hospital Stay (HOSPITAL_COMMUNITY)
Admission: EM | Admit: 2022-07-23 | Discharge: 2022-08-04 | DRG: 286 | Disposition: A | Discharge status: Home / Self Care | Payer: HMO | Attending: Internal Medicine | Admitting: Internal Medicine

## 2022-07-23 ENCOUNTER — Other Ambulatory Visit: Payer: Self-pay

## 2022-07-23 ENCOUNTER — Other Ambulatory Visit (HOSPITAL_COMMUNITY): Payer: HMO

## 2022-07-23 ENCOUNTER — Encounter (HOSPITAL_COMMUNITY): Payer: Self-pay | Admitting: Emergency Medicine

## 2022-07-23 DIAGNOSIS — Z7901 Long term (current) use of anticoagulants: Secondary | ICD-10-CM

## 2022-07-23 DIAGNOSIS — M7989 Other specified soft tissue disorders: Secondary | ICD-10-CM | POA: Diagnosis not present

## 2022-07-23 DIAGNOSIS — I5033 Acute on chronic diastolic (congestive) heart failure: Secondary | ICD-10-CM | POA: Diagnosis present

## 2022-07-23 DIAGNOSIS — Z79899 Other long term (current) drug therapy: Secondary | ICD-10-CM

## 2022-07-23 DIAGNOSIS — J9621 Acute and chronic respiratory failure with hypoxia: Secondary | ICD-10-CM | POA: Diagnosis present

## 2022-07-23 DIAGNOSIS — E876 Hypokalemia: Secondary | ICD-10-CM | POA: Diagnosis not present

## 2022-07-23 DIAGNOSIS — I10 Essential (primary) hypertension: Secondary | ICD-10-CM | POA: Diagnosis present

## 2022-07-23 DIAGNOSIS — K7460 Unspecified cirrhosis of liver: Secondary | ICD-10-CM | POA: Diagnosis present

## 2022-07-23 DIAGNOSIS — Z833 Family history of diabetes mellitus: Secondary | ICD-10-CM

## 2022-07-23 DIAGNOSIS — I480 Paroxysmal atrial fibrillation: Secondary | ICD-10-CM | POA: Diagnosis present

## 2022-07-23 DIAGNOSIS — K219 Gastro-esophageal reflux disease without esophagitis: Secondary | ICD-10-CM | POA: Diagnosis not present

## 2022-07-23 DIAGNOSIS — F32A Depression, unspecified: Secondary | ICD-10-CM | POA: Diagnosis present

## 2022-07-23 DIAGNOSIS — I110 Hypertensive heart disease with heart failure: Principal | ICD-10-CM | POA: Diagnosis present

## 2022-07-23 DIAGNOSIS — E669 Obesity, unspecified: Secondary | ICD-10-CM | POA: Diagnosis present

## 2022-07-23 DIAGNOSIS — E785 Hyperlipidemia, unspecified: Secondary | ICD-10-CM | POA: Diagnosis present

## 2022-07-23 DIAGNOSIS — E872 Acidosis, unspecified: Secondary | ICD-10-CM | POA: Diagnosis present

## 2022-07-23 DIAGNOSIS — E1169 Type 2 diabetes mellitus with other specified complication: Secondary | ICD-10-CM | POA: Diagnosis present

## 2022-07-23 DIAGNOSIS — Z1152 Encounter for screening for COVID-19: Secondary | ICD-10-CM | POA: Diagnosis not present

## 2022-07-23 DIAGNOSIS — M797 Fibromyalgia: Secondary | ICD-10-CM | POA: Diagnosis present

## 2022-07-23 DIAGNOSIS — N179 Acute kidney failure, unspecified: Secondary | ICD-10-CM | POA: Diagnosis present

## 2022-07-23 DIAGNOSIS — Z21 Asymptomatic human immunodeficiency virus [HIV] infection status: Secondary | ICD-10-CM | POA: Diagnosis present

## 2022-07-23 DIAGNOSIS — I5021 Acute systolic (congestive) heart failure: Secondary | ICD-10-CM | POA: Insufficient documentation

## 2022-07-23 DIAGNOSIS — R609 Edema, unspecified: Secondary | ICD-10-CM

## 2022-07-23 DIAGNOSIS — J441 Chronic obstructive pulmonary disease with (acute) exacerbation: Secondary | ICD-10-CM | POA: Diagnosis present

## 2022-07-23 DIAGNOSIS — I509 Heart failure, unspecified: Secondary | ICD-10-CM | POA: Diagnosis not present

## 2022-07-23 DIAGNOSIS — Z8049 Family history of malignant neoplasm of other genital organs: Secondary | ICD-10-CM

## 2022-07-23 DIAGNOSIS — E8809 Other disorders of plasma-protein metabolism, not elsewhere classified: Secondary | ICD-10-CM | POA: Diagnosis present

## 2022-07-23 DIAGNOSIS — J45901 Unspecified asthma with (acute) exacerbation: Secondary | ICD-10-CM | POA: Diagnosis present

## 2022-07-23 DIAGNOSIS — R0902 Hypoxemia: Principal | ICD-10-CM

## 2022-07-23 DIAGNOSIS — G2581 Restless legs syndrome: Secondary | ICD-10-CM | POA: Diagnosis present

## 2022-07-23 DIAGNOSIS — I2729 Other secondary pulmonary hypertension: Secondary | ICD-10-CM | POA: Diagnosis present

## 2022-07-23 DIAGNOSIS — I5031 Acute diastolic (congestive) heart failure: Secondary | ICD-10-CM

## 2022-07-23 DIAGNOSIS — J849 Interstitial pulmonary disease, unspecified: Secondary | ICD-10-CM | POA: Diagnosis present

## 2022-07-23 DIAGNOSIS — Z6830 Body mass index (BMI) 30.0-30.9, adult: Secondary | ICD-10-CM

## 2022-07-23 DIAGNOSIS — Z90711 Acquired absence of uterus with remaining cervical stump: Secondary | ICD-10-CM

## 2022-07-23 DIAGNOSIS — Z8701 Personal history of pneumonia (recurrent): Secondary | ICD-10-CM

## 2022-07-23 DIAGNOSIS — F17210 Nicotine dependence, cigarettes, uncomplicated: Secondary | ICD-10-CM | POA: Diagnosis present

## 2022-07-23 DIAGNOSIS — I959 Hypotension, unspecified: Secondary | ICD-10-CM | POA: Diagnosis present

## 2022-07-23 DIAGNOSIS — Z7984 Long term (current) use of oral hypoglycemic drugs: Secondary | ICD-10-CM

## 2022-07-23 DIAGNOSIS — I272 Pulmonary hypertension, unspecified: Secondary | ICD-10-CM | POA: Diagnosis not present

## 2022-07-23 DIAGNOSIS — Z87442 Personal history of urinary calculi: Secondary | ICD-10-CM

## 2022-07-23 DIAGNOSIS — F419 Anxiety disorder, unspecified: Secondary | ICD-10-CM | POA: Diagnosis present

## 2022-07-23 DIAGNOSIS — J439 Emphysema, unspecified: Secondary | ICD-10-CM | POA: Diagnosis present

## 2022-07-23 DIAGNOSIS — Z91148 Patient's other noncompliance with medication regimen for other reason: Secondary | ICD-10-CM

## 2022-07-23 DIAGNOSIS — Z885 Allergy status to narcotic agent status: Secondary | ICD-10-CM

## 2022-07-23 DIAGNOSIS — Z8542 Personal history of malignant neoplasm of other parts of uterus: Secondary | ICD-10-CM

## 2022-07-23 LAB — CBC WITH DIFFERENTIAL/PLATELET
Abs Immature Granulocytes: 0.25 10*3/uL — ABNORMAL HIGH (ref 0.00–0.07)
Basophils Absolute: 0 10*3/uL (ref 0.0–0.1)
Basophils Relative: 0 %
Eosinophils Absolute: 0 10*3/uL (ref 0.0–0.5)
Eosinophils Relative: 0 %
HCT: 36 % (ref 36.0–46.0)
Hemoglobin: 11 g/dL — ABNORMAL LOW (ref 12.0–15.0)
Immature Granulocytes: 2 %
Lymphocytes Relative: 8 %
Lymphs Abs: 1.1 10*3/uL (ref 0.7–4.0)
MCH: 27.9 pg (ref 26.0–34.0)
MCHC: 30.6 g/dL (ref 30.0–36.0)
MCV: 91.4 fL (ref 80.0–100.0)
Monocytes Absolute: 1 10*3/uL (ref 0.1–1.0)
Monocytes Relative: 8 %
Neutro Abs: 10.5 10*3/uL — ABNORMAL HIGH (ref 1.7–7.7)
Neutrophils Relative %: 82 %
Platelets: 230 10*3/uL (ref 150–400)
RBC: 3.94 MIL/uL (ref 3.87–5.11)
RDW: 20 % — ABNORMAL HIGH (ref 11.5–15.5)
WBC: 13 10*3/uL — ABNORMAL HIGH (ref 4.0–10.5)
nRBC: 0.8 % — ABNORMAL HIGH (ref 0.0–0.2)

## 2022-07-23 LAB — I-STAT VENOUS BLOOD GAS, ED
Acid-base deficit: 4 mmol/L — ABNORMAL HIGH (ref 0.0–2.0)
Bicarbonate: 20.2 mmol/L (ref 20.0–28.0)
Calcium, Ion: 1.01 mmol/L — ABNORMAL LOW (ref 1.15–1.40)
HCT: 34 % — ABNORMAL LOW (ref 36.0–46.0)
Hemoglobin: 11.6 g/dL — ABNORMAL LOW (ref 12.0–15.0)
O2 Saturation: 67 %
Potassium: 4.4 mmol/L (ref 3.5–5.1)
Sodium: 141 mmol/L (ref 135–145)
TCO2: 21 mmol/L — ABNORMAL LOW (ref 22–32)
pCO2, Ven: 34.9 mmHg — ABNORMAL LOW (ref 44–60)
pH, Ven: 7.371 (ref 7.25–7.43)
pO2, Ven: 35 mmHg (ref 32–45)

## 2022-07-23 LAB — RESP PANEL BY RT-PCR (FLU A&B, COVID) ARPGX2
Influenza A by PCR: NEGATIVE
Influenza B by PCR: NEGATIVE
SARS Coronavirus 2 by RT PCR: NEGATIVE

## 2022-07-23 LAB — COMPREHENSIVE METABOLIC PANEL
ALT: 12 U/L (ref 0–44)
AST: 51 U/L — ABNORMAL HIGH (ref 15–41)
Albumin: 2 g/dL — ABNORMAL LOW (ref 3.5–5.0)
Alkaline Phosphatase: 157 U/L — ABNORMAL HIGH (ref 38–126)
Anion gap: 19 — ABNORMAL HIGH (ref 5–15)
BUN: 39 mg/dL — ABNORMAL HIGH (ref 8–23)
CO2: 18 mmol/L — ABNORMAL LOW (ref 22–32)
Calcium: 7.8 mg/dL — ABNORMAL LOW (ref 8.9–10.3)
Chloride: 103 mmol/L (ref 98–111)
Creatinine, Ser: 2.66 mg/dL — ABNORMAL HIGH (ref 0.44–1.00)
GFR, Estimated: 20 mL/min — ABNORMAL LOW (ref >60)
Glucose, Bld: 158 mg/dL — ABNORMAL HIGH (ref 70–99)
Potassium: 4.2 mmol/L (ref 3.5–5.1)
Sodium: 140 mmol/L (ref 135–145)
Total Bilirubin: 1.1 mg/dL (ref 0.3–1.2)
Total Protein: 6.5 g/dL (ref 6.5–8.1)

## 2022-07-23 LAB — ECHOCARDIOGRAM COMPLETE
Area-P 1/2: 4.06 cm2
Height: 64 in
S' Lateral: 2.2 cm
Weight: 3520 oz

## 2022-07-23 LAB — TROPONIN I (HIGH SENSITIVITY)
Troponin I (High Sensitivity): 143 ng/L (ref <18)
Troponin I (High Sensitivity): 147 ng/L (ref <18)
Troponin I (High Sensitivity): 135 ng/L (ref <18)

## 2022-07-23 LAB — HEMOGLOBIN A1C
Hgb A1c MFr Bld: 6.2 % — ABNORMAL HIGH (ref 4.8–5.6)
Mean Plasma Glucose: 131.24 mg/dL

## 2022-07-23 LAB — PROTIME-INR
INR: 1.4 — ABNORMAL HIGH (ref 0.8–1.2)
Prothrombin Time: 16.9 seconds — ABNORMAL HIGH (ref 11.4–15.2)

## 2022-07-23 LAB — HIV ANTIBODY (ROUTINE TESTING W REFLEX): HIV Screen 4th Generation wRfx: REACTIVE — AB

## 2022-07-23 LAB — TSH: TSH: 3.84 u[IU]/mL (ref 0.350–4.500)

## 2022-07-23 LAB — GLUCOSE, CAPILLARY: Glucose-Capillary: 118 mg/dL — ABNORMAL HIGH (ref 70–99)

## 2022-07-23 LAB — BRAIN NATRIURETIC PEPTIDE: B Natriuretic Peptide: 905 pg/mL — ABNORMAL HIGH (ref 0.0–100.0)

## 2022-07-23 LAB — CBG MONITORING, ED: Glucose-Capillary: 144 mg/dL — ABNORMAL HIGH (ref 70–99)

## 2022-07-23 MED ORDER — HEPARIN SODIUM (PORCINE) 5000 UNIT/ML IJ SOLN
5000.0000 [IU] | Freq: Two times a day (BID) | INTRAMUSCULAR | Status: DC
  Administered 2022-07-23 – 2022-07-26 (×6): 5000 [IU] via SUBCUTANEOUS
  Filled 2022-07-23 (×6): qty 1

## 2022-07-23 MED ORDER — SODIUM CHLORIDE 0.9% FLUSH
3.0000 mL | Freq: Two times a day (BID) | INTRAVENOUS | Status: DC
  Administered 2022-07-23 – 2022-08-02 (×17): 3 mL via INTRAVENOUS

## 2022-07-23 MED ORDER — FUROSEMIDE 10 MG/ML IJ SOLN
20.0000 mg | Freq: Two times a day (BID) | INTRAMUSCULAR | Status: DC
  Filled 2022-07-23: qty 2

## 2022-07-23 MED ORDER — METHYLPREDNISOLONE SODIUM SUCC 125 MG IJ SOLR
120.0000 mg | INTRAMUSCULAR | Status: DC
  Administered 2022-07-23: 120 mg via INTRAVENOUS
  Filled 2022-07-23: qty 2

## 2022-07-23 MED ORDER — ACETAMINOPHEN 325 MG PO TABS
650.0000 mg | ORAL_TABLET | ORAL | Status: DC | PRN
  Administered 2022-07-23 – 2022-08-04 (×27): 650 mg via ORAL
  Filled 2022-07-23 (×28): qty 2

## 2022-07-23 MED ORDER — MIDODRINE HCL 5 MG PO TABS
20.0000 mg | ORAL_TABLET | ORAL | Status: AC
  Administered 2022-07-23: 20 mg via ORAL
  Filled 2022-07-23: qty 4

## 2022-07-23 MED ORDER — INSULIN ASPART 100 UNIT/ML IJ SOLN
0.0000 [IU] | Freq: Three times a day (TID) | INTRAMUSCULAR | Status: DC
  Administered 2022-07-23: 1 [IU] via SUBCUTANEOUS
  Administered 2022-07-24 (×2): 2 [IU] via SUBCUTANEOUS
  Administered 2022-07-24: 3 [IU] via SUBCUTANEOUS
  Administered 2022-07-25 – 2022-07-27 (×4): 1 [IU] via SUBCUTANEOUS
  Administered 2022-07-27: 2 [IU] via SUBCUTANEOUS
  Administered 2022-07-29: 1 [IU] via SUBCUTANEOUS
  Administered 2022-07-30: 2 [IU] via SUBCUTANEOUS
  Administered 2022-07-30: 5 [IU] via SUBCUTANEOUS
  Administered 2022-07-30 – 2022-07-31 (×3): 2 [IU] via SUBCUTANEOUS
  Administered 2022-07-31: 3 [IU] via SUBCUTANEOUS
  Administered 2022-08-01: 7 [IU] via SUBCUTANEOUS
  Administered 2022-08-01 (×2): 2 [IU] via SUBCUTANEOUS
  Administered 2022-08-02: 1 [IU] via SUBCUTANEOUS
  Administered 2022-08-02: 7 [IU] via SUBCUTANEOUS
  Administered 2022-08-02 – 2022-08-03 (×2): 3 [IU] via SUBCUTANEOUS
  Administered 2022-08-03: 5 [IU] via SUBCUTANEOUS
  Administered 2022-08-03: 1 [IU] via SUBCUTANEOUS
  Administered 2022-08-04: 3 [IU] via SUBCUTANEOUS

## 2022-07-23 MED ORDER — FUROSEMIDE 10 MG/ML IJ SOLN: 20.0000 mg | Freq: Two times a day (BID) | INTRAMUSCULAR | Status: DC

## 2022-07-23 MED ORDER — DICYCLOMINE HCL 10 MG PO CAPS: 10.0000 mg | ORAL_CAPSULE | Freq: Three times a day (TID) | ORAL | Status: DC | PRN

## 2022-07-23 MED ORDER — ATORVASTATIN CALCIUM 40 MG PO TABS
40.0000 mg | ORAL_TABLET | Freq: Every day | ORAL | Status: DC
  Administered 2022-07-23 – 2022-08-03 (×12): 40 mg via ORAL
  Filled 2022-07-23 (×12): qty 1

## 2022-07-23 MED ORDER — ARIPIPRAZOLE 5 MG PO TABS
5.0000 mg | ORAL_TABLET | Freq: Every day | ORAL | Status: DC
  Administered 2022-07-24 – 2022-08-04 (×12): 5 mg via ORAL
  Filled 2022-07-23 (×12): qty 1

## 2022-07-23 MED ORDER — SODIUM CHLORIDE 0.9 % IV SOLN: 250.0000 mL | INTRAVENOUS | Status: DC | PRN

## 2022-07-23 MED ORDER — ALBUTEROL SULFATE (2.5 MG/3ML) 0.083% IN NEBU
5.0000 mg | INHALATION_SOLUTION | Freq: Once | RESPIRATORY_TRACT | Status: AC
  Administered 2022-07-23: 5 mg via RESPIRATORY_TRACT
  Filled 2022-07-23: qty 6

## 2022-07-23 MED ORDER — SODIUM CHLORIDE 0.9% FLUSH: 3.0000 mL | INTRAVENOUS | Status: DC | PRN

## 2022-07-23 MED ORDER — IPRATROPIUM-ALBUTEROL 0.5-2.5 (3) MG/3ML IN SOLN
3.0000 mL | Freq: Four times a day (QID) | RESPIRATORY_TRACT | Status: DC
  Administered 2022-07-23 – 2022-07-24 (×4): 3 mL via RESPIRATORY_TRACT
  Filled 2022-07-23 (×3): qty 3

## 2022-07-23 MED ORDER — FLUOXETINE HCL 20 MG PO CAPS
40.0000 mg | ORAL_CAPSULE | Freq: Every day | ORAL | Status: DC
  Administered 2022-07-24 – 2022-08-04 (×12): 40 mg via ORAL
  Filled 2022-07-23 (×12): qty 2

## 2022-07-23 MED ORDER — ALBUMIN HUMAN 25 % IV SOLN
25.0000 g | Freq: Four times a day (QID) | INTRAVENOUS | Status: DC
  Administered 2022-07-23 (×2): 12.5 g via INTRAVENOUS
  Administered 2022-07-24 (×3): 25 g via INTRAVENOUS
  Filled 2022-07-23 (×4): qty 100

## 2022-07-23 MED ORDER — IPRATROPIUM BROMIDE 0.02 % IN SOLN
0.5000 mg | Freq: Once | RESPIRATORY_TRACT | Status: AC
  Administered 2022-07-23: 0.5 mg via RESPIRATORY_TRACT
  Filled 2022-07-23: qty 2.5

## 2022-07-23 MED ORDER — ALBUMIN HUMAN 25 % IV SOLN: 25.0000 g | Freq: Four times a day (QID) | INTRAVENOUS | Status: DC

## 2022-07-23 MED ORDER — DOXEPIN HCL 50 MG PO CAPS
100.0000 mg | ORAL_CAPSULE | Freq: Every day | ORAL | Status: DC
  Administered 2022-07-24 – 2022-08-03 (×11): 100 mg via ORAL
  Filled 2022-07-23 (×2): qty 2
  Filled 2022-07-23: qty 1
  Filled 2022-07-23 (×10): qty 2

## 2022-07-23 MED ORDER — CLONAZEPAM 0.5 MG PO TABS
0.5000 mg | ORAL_TABLET | Freq: Two times a day (BID) | ORAL | Status: DC
  Administered 2022-07-24 – 2022-07-29 (×11): 0.5 mg via ORAL
  Filled 2022-07-23 (×11): qty 1

## 2022-07-23 MED ORDER — HEPARIN SODIUM (PORCINE) 5000 UNIT/ML IJ SOLN: 5000.0000 [IU] | Freq: Three times a day (TID) | INTRAMUSCULAR | Status: DC

## 2022-07-23 MED ORDER — PANTOPRAZOLE SODIUM 40 MG PO TBEC
40.0000 mg | DELAYED_RELEASE_TABLET | Freq: Every day | ORAL | Status: DC
  Administered 2022-07-24 – 2022-08-04 (×12): 40 mg via ORAL
  Filled 2022-07-23 (×12): qty 1

## 2022-07-23 MED ORDER — BUDESONIDE 0.25 MG/2ML IN SUSP
0.2500 mg | Freq: Two times a day (BID) | RESPIRATORY_TRACT | Status: DC
  Administered 2022-07-23 – 2022-08-04 (×24): 0.25 mg via RESPIRATORY_TRACT
  Filled 2022-07-23 (×23): qty 2

## 2022-07-23 MED ORDER — ONDANSETRON HCL 4 MG/2ML IJ SOLN: 4.0000 mg | Freq: Four times a day (QID) | INTRAMUSCULAR | Status: DC | PRN

## 2022-07-23 MED ORDER — NICOTINE 21 MG/24HR TD PT24
21.0000 mg | MEDICATED_PATCH | Freq: Every day | TRANSDERMAL | Status: DC
  Administered 2022-07-23 – 2022-07-28 (×6): 21 mg via TRANSDERMAL
  Filled 2022-07-23 (×6): qty 1

## 2022-07-23 MED ORDER — PREDNISONE 20 MG PO TABS: 40.0000 mg | ORAL_TABLET | Freq: Every day | ORAL | Status: DC

## 2022-07-23 NOTE — H&P (Signed)
History and Physical    [name] BSJ:[id number] DOB: 05-12-1961 DOA: 07/23/2022  PCP: Ginger Organ., MD (Confirm with patient/family/NH records and if not entered, this has to be entered at [facility] point of entry) Patient coming from: Home  I have personally briefly reviewed patient's old medical records in [place]  Chief Complaint: Leg swelling, SOB  HPI: [name] is a 61 y.o. female with medical history significant of COPD, HTN, IIDM, HLD, anxiety/depression, obesity, chronic pain syndrome/fibromyalgia, GERD, came with worsening of shortness of breath, leg swelling and low-grade fever.  Symptoms started 3 weeks ago, patient started to develop dry cough and wheezing, was started on as needed albuterol.  Increasedly, she started noticed development of bilateral lower extremity edema, gradually, extending to bilateral knees.  Her breathing symptoms and shortness of breath however not improving with the albuterol treatment.  She is also complaining about pleuritic chest pain whenever she does cough or deep breath.  She also noticed significant decrease of her urine output and her urine color has turned tea colored, but she denies any dysuria burning sensation or back pain.  She was prescribed with Lasix, but she reported has not been able to take any Lasix yet.  Last night, she developed orthopnea and could not sleep, and woke up this morning with severe shortness of breath.  ED Course: Patient was found to be in significant respiratory distress tachypnea, blood pressure elevated, initially supplied on 6 L then to switch to BiPAP to relieve breathing effort.  Chest x-ray showed pulmonary edema.  Blood work showed AKI creatinine 2.6 compared to 0.792 years ago, bicarb 18, K4.2, BUN 39.  WBC 13, hemoglobin 11.0 troponins 143> 147, EKG showed no acute ST changes.  Review of Systems: As per HPI otherwise 14 point review of systems negative.    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, type 2   Diverticulitis    Dysplasia of cervix (uteri)    patient states she had dysplasia of the uterus stage 4   Endometrial cancer (HCC)    Fibromyalgia    Fibromyalgia    GERD (gastroesophageal reflux disease)    Headache(784.0)    migraine headache   Hyperlipidemia    Hypertension    Kidney stones    at least 6 stones in past   Pneumonia 2019   Restless leg syndrome    Bilateral   Vomiting    last night at 2300 after last round of antibiotic    Past Surgical History:  Procedure Laterality Date   ABDOMINAL HYSTERECTOMY     APPENDECTOMY     APPLICATION OF WOUND VAC N/A 11/01/2012   Procedure: APPLICATION OF WOUND VAC;  Surgeon: Joyice Faster. Cornett, MD;  Location: [facility];  Service: General;  Laterality: N/A;   CHOLECYSTECTOMY     COLONOSCOPY  2013   COLOSTOMY     COLOSTOMY CLOSURE N/A 11/01/2012   Procedure: COLOSTOMY CLOSURE;  Surgeon: Joyice Faster. Cornett, MD;  Location: [facility];  Service: General;  Laterality: N/A;  REPAIR PARASTOMAL HERNIA; REVISION OF COLOSTOMY   COLOSTOMY CLOSURE N/A 05/03/2013   Procedure: COLOSTOMY CLOSURE;  Surgeon: Joyice Faster. Cornett,  MD;  Location: [facility];  Service: General;  Laterality: N/A;   DILATION AND CURETTAGE OF UTERUS N/A 05/24/2019   Procedure: DILATATION AND CURETTAGE;  Surgeon: Gery Pray, MD;  Location: [facility];  Service: Gynecology;  Laterality: N/A;   ESOPHAGOGASTRODUODENOSCOPY Left 11/27/2012   Procedure: ESOPHAGOGASTRODUODENOSCOPY (EGD);  Surgeon: Winfield Cunas., MD;  Location: [facility];  Service: Gastroenterology;  Laterality: Left;   INCISIONAL HERNIA REPAIR N/A 11/01/2012   Procedure: HERNIA REPAIR INCISIONAL;  Surgeon: Joyice Faster. Cornett, MD;  Location: [facility];  Service: General;  Laterality: N/A;   LAPAROTOMY  11/01/2012   Procedure: EXPLORATORY LAPAROTOMY;  Surgeon: Joyice Faster. Cornett, MD;  Location: [facility];  Service: General;;  exploratory laparotomy,repair of parastomal hernia and incisional hernia, lysis of adhesions, and application of wound V.A.C.   LYSIS OF ADHESION N/A 11/01/2012   Procedure: LYSIS OF ADHESION;  Surgeon: Joyice Faster. Cornett, MD;  Location: [facility];  Service: General;  Laterality: N/A;   OPERATIVE ULTRASOUND N/A 06/14/2019   Procedure: OPERATIVE ULTRASOUND;  Surgeon: Gery Pray, MD;  Location: [facility];  Service: Urology;  Laterality: N/A;   PARASTOMAL HERNIA REPAIR N/A 11/01/2012   Procedure: HERNIA REPAIR PARASTOMAL;  Surgeon: Joyice Faster. Cornett, MD;  Location: [facility];  Service: General;  Laterality: N/A;   PARTIAL HYSTERECTOMY     partial   TANDEM RING INSERTION N/A 05/24/2019   Procedure: INSERTION OF HYMEN CAPSULE;  Surgeon: Gery Pray, MD;  Location: [facility];  Service: Gynecology;  Laterality: N/A;   TANDEM RING INSERTION N/A 06/14/2019   Procedure: HYMEN CAPSULE PLACEMENT;  Surgeon: Gery Pray, MD;  Location: [facility];  Service: Urology;  Laterality: N/A;     reports that she quit smoking about 13 years ago. Her smoking use included cigarettes. She smoked an average of .5 packs per day. She has never used smokeless tobacco. She reports that she does not drink alcohol and does not use drugs.  Allergies  Allergen Reactions   Tramadol     Per Dr. Sharyon Medicus is renal failure, patient states that she is currently taking this medication and she has not had a reaction to Tramadol since this time.    Family History  Problem Relation Age of Onset   Diabetes Mother    Throat cancer Mother    Uterine cancer Sister    Deep vein thrombosis Neg Hx    Pulmonary embolism Neg Hx      Prior to Admission medications   Medication Sig Start Date End Date Taking? Authorizing Provider  ARIPiprazole (ABILIFY) 5 MG tablet  Take 1 tablet (5 mg total) by mouth daily. 05/06/22   Arfeen, Arlyce Harman, MD  atorvastatin (LIPITOR) 40 MG tablet Take 40 mg by mouth at bedtime.  01/17/14   [provider]  clonazePAM Bobbye [place]) 1 MG tablet Take 1 tab twice daily 05/06/22   Arfeen, Arlyce Harman, MD  dicyclomine (BENTYL) 10 MG capsule Take 10 mg by mouth every 8 (eight) hours as needed for spasms (abdominal spasms).     [provider]  doxepin (SINEQUAN) 100 MG capsule Take 1 capsule (100 mg total) by mouth at bedtime. 05/06/22 05/06/23  Arfeen, Arlyce Harman, MD  FLUoxetine (PROZAC) 40 MG capsule TAKE 1 CAPSULE BY MOUTH EVERY DAY 05/06/22   Arfeen, Arlyce Harman, MD  furosemide (LASIX) 20 MG tablet Take 20 mg by mouth daily as needed. 01/17/21   [provider]  lamoTRIgine (LAMICTAL) 150 MG tablet Take 1 tablet (150 mg total) by  mouth 2 (two) times daily. Patient not taking: Reported on 08/07/2021 07/01/21   Arfeen, Arlyce Harman, MD  lisinopril (PRINIVIL,ZESTRIL) 20 MG tablet Take 20 mg by mouth daily.  06/15/17   [provider]  metFORMIN (GLUCOPHAGE) 1000 MG tablet Take 1,000 mg by mouth 2 (two) times daily with a meal.  Patient not taking: Reported on 04/16/2020 01/03/14   [provider]  morphine (MS CONTIN) 15 MG 12 hr tablet Take 1 tablet (15 mg total) by mouth every 12 (twelve) hours. Patient not taking: Reported on 11/22/2019 06/14/19   Gery Pray, MD  omeprazole (PRILOSEC) 40 MG capsule Take 40 mg by mouth daily before breakfast.  06/07/17   [provider]  [facility] VERIO test strip CHECK SUGARS TWICE A DAY DX  DM2, INSULIN DEPENDANT 05/15/19   [provider]  prochlorperazine (COMPAZINE) 10 MG tablet Take 1 tablet (10 mg total) by mouth every 6 (six) hours as needed for nausea or vomiting. Patient not taking: Reported on 03/04/2021 06/14/19   Gery Pray, MD  VENTOLIN HFA 108 (90 BASE) MCG/ACT inhaler Inhale 2 puffs into the lungs every 6 (six) hours as needed for wheezing or shortness of  breath.  07/05/13   [provider]    Physical Exam: Vitals:   07/23/22 1100 07/23/22 1130 07/23/22 1200 07/23/22 1230  BP: (!) 107/47 (!) 126/49 93/60 (!) 101/53  Pulse: 94 93 93 91  Resp: (!) 26 (!) 27 14 (!) 23  Temp:      TempSrc:      SpO2: 98% 97% 97% 97%  Weight:      Height:        Constitutional: NAD, calm, comfortable Vitals:   07/23/22 1100 07/23/22 1130 07/23/22 1200 07/23/22 1230  BP: (!) 107/47 (!) 126/49 93/60 (!) 101/53  Pulse: 94 93 93 91  Resp: (!) 26 (!) 27 14 (!) 23  Temp:      TempSrc:      SpO2: 98% 97% 97% 97%  Weight:      Height:       Eyes: PERRL, lids and conjunctivae normal ENMT: Mucous membranes are moist. Posterior pharynx clear of any exudate or lesions.Normal dentition.  Neck: normal, supple, no masses, no thyromegaly Respiratory: clear to auscultation bilaterally, scattered wheezing, fine crackles to bilateral mid levels, increasing breathing effort. No accessory muscle use.  Cardiovascular: Regular rate and rhythm, no murmurs / rubs / gallops.  2+ extremity edema to bilateral mid thigh. 2+ pedal pulses. No carotid bruits.  Abdomen: no tenderness, no masses palpated. No hepatosplenomegaly. Bowel sounds positive.  Musculoskeletal: no clubbing / cyanosis. No joint deformity upper and lower extremities. Good ROM, no contractures. Normal muscle tone.  Skin: no rashes, lesions, ulcers. No induration Neurologic: CN 2-12 grossly intact. Sensation intact, DTR normal. Strength 5/5 in all 4.  Psychiatric: Normal judgment and insight. Alert and oriented x 3. Normal mood.     Labs on Admission: I have personally reviewed following labs and imaging studies  CBC: Recent Labs  Lab 07/23/22 0952  WBC 13.0*  NEUTROABS 10.5*  HGB 11.0*  HCT 36.0  MCV 91.4  PLT 655   Basic Metabolic Panel: Recent Labs  Lab 07/23/22 0952  NA 140  K 4.2  CL 103  CO2 18*  GLUCOSE 158*  BUN 39*  CREATININE 2.66*  CALCIUM 7.8*   GFR: Estimated  Creatinine Clearance: 24.8 mL/min (A) (by C-G formula based on SCr of 2.66 mg/dL (H)). Liver Function Tests: Recent Labs  Lab 07/23/22 0952  AST 51*  ALT 12  ALKPHOS 157*  BILITOT 1.1  PROT 6.5  ALBUMIN 2.0*   No results for input(s): "LIPASE", "AMYLASE" in the last 168 hours. No results for input(s): "AMMONIA" in the last 168 hours. Coagulation Profile: No results for input(s): "INR", "PROTIME" in the last 168 hours. Cardiac Enzymes: No results for input(s): "CKTOTAL", "CKMB", "CKMBINDEX", "TROPONINI" in the last 168 hours. BNP (last 3 results) No results for input(s): "PROBNP" in the last 8760 hours. HbA1C: No results for input(s): "HGBA1C" in the last 72 hours. CBG: No results for input(s): "GLUCAP" in the last 168 hours. Lipid Profile: No results for input(s): "CHOL", "HDL", "LDLCALC", "TRIG", "CHOLHDL", "LDLDIRECT" in the last 72 hours. Thyroid Function Tests: No results for input(s): "TSH", "T4TOTAL", "FREET4", "T3FREE", "THYROIDAB" in the last 72 hours. Anemia Panel: No results for input(s): "VITAMINB12", "FOLATE", "FERRITIN", "TIBC", "IRON", "RETICCTPCT" in the last 72 hours. Urine analysis:    Component Value Date/Time   COLORURINE YELLOW 11/22/2019 1351   APPEARANCEUR CLEAR 11/22/2019 1351   LABSPEC 1.008 11/22/2019 1351   PHURINE 5.0 11/22/2019 1351   GLUCOSEU NEGATIVE 11/22/2019 1351   HGBUR SMALL (A) 11/22/2019 1351   BILIRUBINUR NEGATIVE 11/22/2019 1351   KETONESUR NEGATIVE 11/22/2019 1351   PROTEINUR NEGATIVE 11/22/2019 1351   UROBILINOGEN 0.2 11/02/2012 1530   NITRITE NEGATIVE 11/22/2019 1351   LEUKOCYTESUR NEGATIVE 11/22/2019 1351    Radiological Exams on Admission: DG Chest Portable 1 View  Result Date: 07/23/2022 CLINICAL DATA:  Shortness of breath. EXAM: PORTABLE CHEST 1 VIEW COMPARISON:  May 01, 2013. FINDINGS: Mild cardiomegaly is noted. Mildly diffuse interstitial densities are noted throughout both lungs concerning for pulmonary edema or  possibly atypical inflammation. Bony thorax is unremarkable. IMPRESSION: Mild diffuse interstitial densities are noted bilaterally concerning for pulmonary edema or possibly atypical inflammation. Electronically Signed   By: Marijo Conception M.D.   On: 07/23/2022 10:35    EKG: Independently reviewed.  Sinus, no acute ST changes, prolonged QTc  Assessment/Plan Principal Problem:   CHF (congestive heart failure) (HCC) Active Problems:   Acute exacerbation of COPD with asthma (HCC)   AKI (acute kidney injury) (HCC)   CHF (congestive heart failure), NYHA class III, acute, systolic ([place])  (please populate well all problems here in Problem List. (For example, if patient is on BP meds at home and you resume or decide to hold them, it is a problem that needs to be her. Same for CAD, COPD, HLD and so on)  Acute hypoxic respiratory failure -We will check VBG to rule out CO2 retention given history of COPD. -Probably multifactorial from acute COPD exacerbation along with new onset of CHF decompensation. -No known CHF history, within normal echocardiogram in October 2022.  Right now, patient is significant fluid overload, will start patient on Lasix 20 mg twice daily titrate according to response.  We will also start patient on albumin infusion every 6 hours as patient has significant hypoalbuminemia, condition which can eliminate diuresis. Echo -Treat COPD as below. -Wean BIPAP -Other DDx, significant hypoalbuminemia and AKI, recent concern about nephrotic syndrome can also cause fluid overload and AKI.  Check UA. 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 -DVT study  Acute CHF decompensation, suspect baseline chronic diastolic CHF -Diuresis as above -Echocardiogram -Daily weight, input/output  Acute COPD exacerbation -Breathing treatment, start Pulmicort -Short course of p.o. steroid -Reviewed patient case with pulmonary, patient needs to PFT last year and this  year.  Chest CT 6 months ago showed significant emphysema and signs of pulmonary fibrosis likely from prolonged cigarette smoke.  AKI with non-anion gap metabolic acidosis -Rule out nephrotic syndrome, check UA -Renal ultrasound -FeNa -Anasarca, requiring diuresis, monitor urine output and daily kidney function. -VBG -Hold off ACEI  Elevated troponins -Patient is flat likely from demanding ischemia from CHF decompensation and COPD exacerbation -Echocardiogram -Recycle Trop x1  Anxiety/depression -Continue SSRI, continue Klonopin and doxepin, monitor EKG for Qtc  IIDM -Hold metformin given there is a AKI, changed to sliding scale  Obesity -BMI= 37, calorie control recommended  Cigarette smoker -Cessation education at bedside -Nicotine patch  DVT prophylaxis: Heparin subcu Code Status: Full code Family Communication: None at bedside Disposition Plan: Expect more than 2 midnight hospital stay patient is sick with hypoxia requiring BiPAP and new onset of CHF requiring IV diuresis as well as COPD exacerbation Consults called: Curbside consult with pulm Admission status: PCU   Lequita Halt MD [facility] Pager [phone]  07/23/2022, 12:47 PM

## 2022-07-23 NOTE — ED Provider Notes (Signed)
[facility] EMERGENCY DEPARTMENT Provider Note   CSN: [id number] Arrival date & time: 07/23/22  8341     History  Chief Complaint  Patient presents with   Shortness of Breath   Chest Pain    [name] is a 61 y.o. female.   Shortness of Breath Associated symptoms: chest pain   Chest Pain Associated symptoms: shortness of breath   Patient presents with shortness of breath.  Has had for around 2 weeks now.  Worse for last few days.  History of COPD.  Has recently quit smoking 2.  Not on oxygen at home but found to be hypoxic with reported sats in the 70s by EMS.  Improved on nonrebreather.  States she has had a cough with mild production at times.  States she has had fevers at x2.  States she has more swelling in her legs.  No history of CHF but does have history of COPD.    Past Medical History:  Diagnosis Date   Acid reflux disease    Anxiety    Asthma    Bronchitis, chronic (HCC)    Effects worse with URI   Chronic pain syndrome    COPD (chronic obstructive pulmonary disease) (HCC)    Depression    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, type 2   Diverticulitis    Dysplasia of cervix (uteri)    patient states she had dysplasia of the uterus stage 4   Endometrial cancer (HCC)    Fibromyalgia    Fibromyalgia    GERD (gastroesophageal reflux disease)    Headache(784.0)    migraine headache   Hyperlipidemia    Hypertension    Kidney stones    at least 6 stones in past   Pneumonia 2019   Restless leg syndrome    Bilateral   Vomiting    last night at 2300 after last round of antibiotic    Home Medications Prior to Admission medications   Medication Sig Start Date End Date Taking? Authorizing Provider  ARIPiprazole (ABILIFY) 5 MG tablet Take 1 tablet (5 mg total) by mouth daily. 05/06/22   Arfeen, Arlyce Harman, MD  atorvastatin (LIPITOR) 40 MG tablet Take 40 mg by mouth at bedtime.  01/17/14   [provider]   clonazePAM Bobbye [place]) 1 MG tablet Take 1 tab twice daily 05/06/22   Arfeen, Arlyce Harman, MD  dicyclomine (BENTYL) 10 MG capsule Take 10 mg by mouth every 8 (eight) hours as needed for spasms (abdominal spasms).     [provider]  doxepin (SINEQUAN) 100 MG capsule Take 1 capsule (100 mg total) by mouth at bedtime. 05/06/22 05/06/23  Arfeen, Arlyce Harman, MD  FLUoxetine (PROZAC) 40 MG capsule TAKE 1 CAPSULE BY MOUTH EVERY DAY 05/06/22   Arfeen, Arlyce Harman, MD  furosemide (LASIX) 20 MG tablet Take 20 mg by mouth daily as needed. 01/17/21   [provider]  lamoTRIgine (LAMICTAL) 150 MG tablet Take 1 tablet (150 mg total) by mouth 2 (two) times daily. Patient not taking: Reported on 08/07/2021 07/01/21   Arfeen, Arlyce Harman, MD  lisinopril (PRINIVIL,ZESTRIL) 20 MG tablet Take 20 mg by mouth daily.  06/15/17   [provider]  metFORMIN (GLUCOPHAGE) 1000 MG tablet Take 1,000 mg by mouth 2 (two) times daily with a meal.  Patient not taking: Reported on 04/16/2020 01/03/14   [provider]  morphine (MS CONTIN) 15 MG 12 hr tablet Take 1 tablet (15 mg total)  by mouth every 12 (twelve) hours. Patient not taking: Reported on 11/22/2019 06/14/19   Gery Pray, MD  omeprazole (PRILOSEC) 40 MG capsule Take 40 mg by mouth daily before breakfast.  06/07/17   [provider]  [facility] VERIO test strip CHECK SUGARS TWICE A DAY DX  DM2, INSULIN DEPENDANT 05/15/19   [provider]  prochlorperazine (COMPAZINE) 10 MG tablet Take 1 tablet (10 mg total) by mouth every 6 (six) hours as needed for nausea or vomiting. Patient not taking: Reported on 03/04/2021 06/14/19   Gery Pray, MD  VENTOLIN HFA 108 (90 BASE) MCG/ACT inhaler Inhale 2 puffs into the lungs every 6 (six) hours as needed for wheezing or shortness of breath.  07/05/13   [provider]      Allergies    Tramadol    Review of Systems   Review of Systems  Respiratory:  Positive for shortness of breath.    Cardiovascular:  Positive for chest pain.    Physical Exam Updated Vital Signs BP (!) 126/49   Pulse 93   Temp 97.6 F (36.4 C) (Oral)   Resp (!) 27   Ht '5\' 4"'$  (1.626 m)   Wt 99.8 kg   LMP 04/20/2013   SpO2 97%   BMI 37.76 kg/m  Physical Exam Vitals and nursing note reviewed.  HENT:     Head: Normocephalic.  Cardiovascular:     Rate and Rhythm: Regular rhythm.  Pulmonary:     Comments: Tachypnea.  Harsh breath sounds with potential rales at left base along with some wheezes. Chest:     Chest wall: No tenderness.  Abdominal:     Tenderness: There is no abdominal tenderness.  Musculoskeletal:     Right lower leg: Edema present.     Left lower leg: Edema present.     Comments: Moderate pitting edema bilateral lower extremities.  Skin:    Capillary Refill: Capillary refill takes less than 2 seconds.  Neurological:     Mental Status: She is alert and oriented to person, place, and time.     ED Results / Procedures / Treatments   Labs (all labs ordered are listed, but only abnormal results are displayed) Labs Reviewed  BRAIN NATRIURETIC PEPTIDE - Abnormal; Notable for the following components:      Result Value   B Natriuretic Peptide 905.0 (*)    All other components within normal limits  COMPREHENSIVE METABOLIC PANEL - Abnormal; Notable for the following components:   CO2 18 (*)    Glucose, Bld 158 (*)    BUN 39 (*)    Creatinine, Ser 2.66 (*)    Calcium 7.8 (*)    Albumin 2.0 (*)    AST 51 (*)    Alkaline Phosphatase 157 (*)    GFR, Estimated 20 (*)    Anion gap 19 (*)    All other components within normal limits  CBC WITH DIFFERENTIAL/PLATELET - Abnormal; Notable for the following components:   WBC 13.0 (*)    Hemoglobin 11.0 (*)    RDW 20.0 (*)    nRBC 0.8 (*)    Neutro Abs 10.5 (*)    Abs Immature Granulocytes 0.25 (*)    All other components within normal limits  TROPONIN I (HIGH SENSITIVITY) - Abnormal; Notable for the following components:    Troponin I (High Sensitivity) 143 (*)    All other components within normal limits  RESP PANEL BY RT-PCR (FLU A&B, COVID) [id number]  TROPONIN I (HIGH SENSITIVITY)  EKG EKG Interpretation  Date/Time:  Friday July 23 2022 09:47:53 EDT Ventricular Rate:  99 PR Interval:  141 QRS Duration: 103 QT Interval:  398 QTC Calculation: 511 R Axis:   90 Text Interpretation: Sinus rhythm Borderline right axis deviation Low voltage, precordial leads Nonspecific T abnormalities, anterior leads Prolonged QT interval No significant change since last tracing Confirmed by Davonna Belling [phone]) on 07/23/2022 10:10:57 AM  Radiology DG Chest Portable 1 View  Result Date: 07/23/2022 CLINICAL DATA:  Shortness of breath. EXAM: PORTABLE CHEST 1 VIEW COMPARISON:  May 01, 2013. FINDINGS: Mild cardiomegaly is noted. Mildly diffuse interstitial densities are noted throughout both lungs concerning for pulmonary edema or possibly atypical inflammation. Bony thorax is unremarkable. IMPRESSION: Mild diffuse interstitial densities are noted bilaterally concerning for pulmonary edema or possibly atypical inflammation. Electronically Signed   By: Marijo Conception M.D.   On: 07/23/2022 10:35    Procedures Procedures    Medications Ordered in ED Medications  albuterol (PROVENTIL) (2.5 MG/3ML) 0.083% nebulizer solution 5 mg (5 mg Nebulization Given 07/23/22 1011)  ipratropium (ATROVENT) nebulizer solution 0.5 mg (0.5 mg Nebulization Given 07/23/22 1011)    ED Course/ Medical Decision Making/ A&P                           Medical Decision Making Amount and/or Complexity of Data Reviewed Labs: ordered. Radiology: ordered.  Risk Prescription drug management. Decision regarding hospitalization.   Patient shortness of breath.  Is had over the last couple weeks.  However rather hypoxic upon arrival here.  For EMS had sats in the 70s.  Attempted to titrate down oxygen but recurrent hypoxia.  Will start BiPAP.   Differential diagnosis includes her COPD, pneumonia, also CHF with the edema in both legs.  Reviewed previous note from PCP and reviewed echocardiogram from a couple years ago and had no diastolic or systolic heart failure at that time.  We will get blood work chest x-ray and COVID testing.  Will start on BiPAP.  Patient feeling somewhat better after being on BiPAP.  FiO2 now 40% and sats near 100.  We will attempt to wean off.  However does have some mild hypotension at times.  Found to have creatinine that is now elevated 2.6 from a last measurement I have 0.8 although this was 2 years ago.  No IVC BNP is elevated.  Troponin also mildly elevated.  I think the troponin elevation is secondary to either the hypoxia or potentially CHF.  Will require admission to hospital.  Pneumonia felt less likely.  Negative COVID and flu testing.  Reviewed note from Dr. Raul Del office from 10/20.  Had peripheral edema and had increased Lasix for 3 days.  CRITICAL CARE Performed by: Davonna Belling Total critical care time: 30 minutes Critical care time was exclusive of separately billable procedures and treating other patients. Critical care was necessary to treat or prevent imminent or life-threatening deterioration. Critical care was time spent personally by me on the following activities: development of treatment plan with patient and/or surrogate as well as nursing, discussions with consultants, evaluation of patient's response to treatment, examination of patient, obtaining history from patient or surrogate, ordering and performing treatments and interventions, ordering and review of laboratory studies, ordering and review of radiographic studies, pulse oximetry and re-evaluation of patient's condition.            Final Clinical Impression(s) / ED Diagnoses Final diagnoses:  Hypoxia  AKI (acute kidney  injury) [facility])  Peripheral edema    Rx / DC Orders ED Discharge Orders     None          Davonna Belling, MD 07/23/22 1203

## 2022-07-23 NOTE — Progress Notes (Signed)
Lower extremity venous has been completed.   Preliminary results in CV Proc.   [name] 07/23/2022 2:17 PM

## 2022-07-23 NOTE — Progress Notes (Signed)
RT weaned patient off BiPAP to 7L HFNC.  Patient VSS.  RT will place patient back on BiPAP QHS.

## 2022-07-23 NOTE — ED Notes (Signed)
Ultrasound at bedside

## 2022-07-23 NOTE — ED Notes (Signed)
Critical troponin 143.  MD Pickering notified and aware.  No new orders at this time.

## 2022-07-23 NOTE — Progress Notes (Addendum)
Renal U/S unremarkable, DVT study negative.  INR elevated, but no Hx of cirrhosis, RUQ U/S came back showing signs of cirrhosis. Will check Hep B and C and ammonia level.  Echo reviewed. LVEF appears to be preserved with grade I diastolic dysfunction. Right side however showed severe reduced RV function probably severe pulm HTN and Cor pulmonali. Also Echo showed warning signs of PE. Will order VQ scan.   D/W on call PCCM attending, recommend stat V/Q and pulmonary's opinion is that patient most likely has severe COPD on her past CT scan and likely the cause for her severe pulm HTN and cor pulmonali. Likely also caused cirrhosis. Will wait for V/Q and hold off anticoagulation now.  No urine output since arrival in ED, will order a Foley for accurate I/O tonight.

## 2022-07-23 NOTE — Progress Notes (Signed)
Pt was transported on BiPAP to vascular and back to ED 20 without any complications.

## 2022-07-23 NOTE — ED Triage Notes (Signed)
Pt BIB [facility] from home due to [facility] and chest pain.  Pt had a new prescription for new inhaler that gave her bad side effects so she stopped.  Pt reports fever the last 2 weeks.  Chest pain and SHOB increase the last 3 days.  Hx of COPD.  Pt has recently quit smoking 07/17/22.  Usually does not have to be on oxygen at home; currently on NRB at 6L.  20 left AC.

## 2022-07-23 NOTE — Progress Notes (Signed)
  Echocardiogram 2D Echocardiogram has been performed.  [name] 07/23/2022, 5:54 PM

## 2022-07-24 ENCOUNTER — Inpatient Hospital Stay (HOSPITAL_COMMUNITY): Payer: HMO

## 2022-07-24 DIAGNOSIS — N179 Acute kidney failure, unspecified: Secondary | ICD-10-CM | POA: Diagnosis not present

## 2022-07-24 DIAGNOSIS — M797 Fibromyalgia: Secondary | ICD-10-CM

## 2022-07-24 DIAGNOSIS — J45901 Unspecified asthma with (acute) exacerbation: Secondary | ICD-10-CM

## 2022-07-24 DIAGNOSIS — I5033 Acute on chronic diastolic (congestive) heart failure: Secondary | ICD-10-CM

## 2022-07-24 DIAGNOSIS — E669 Obesity, unspecified: Secondary | ICD-10-CM

## 2022-07-24 DIAGNOSIS — I10 Essential (primary) hypertension: Secondary | ICD-10-CM

## 2022-07-24 DIAGNOSIS — K219 Gastro-esophageal reflux disease without esophagitis: Secondary | ICD-10-CM

## 2022-07-24 DIAGNOSIS — J441 Chronic obstructive pulmonary disease with (acute) exacerbation: Secondary | ICD-10-CM | POA: Diagnosis not present

## 2022-07-24 LAB — BASIC METABOLIC PANEL
Anion gap: 18 — ABNORMAL HIGH (ref 5–15)
BUN: 49 mg/dL — ABNORMAL HIGH (ref 8–23)
CO2: 21 mmol/L — ABNORMAL LOW (ref 22–32)
Calcium: 8.3 mg/dL — ABNORMAL LOW (ref 8.9–10.3)
Chloride: 104 mmol/L (ref 98–111)
Creatinine, Ser: 2.17 mg/dL — ABNORMAL HIGH (ref 0.44–1.00)
GFR, Estimated: 25 mL/min — ABNORMAL LOW (ref >60)
Glucose, Bld: 156 mg/dL — ABNORMAL HIGH (ref 70–99)
Potassium: 4.3 mmol/L (ref 3.5–5.1)
Sodium: 143 mmol/L (ref 135–145)

## 2022-07-24 LAB — HEPATITIS C ANTIBODY: HCV Ab: NONREACTIVE

## 2022-07-24 LAB — BLOOD GAS, ARTERIAL
Acid-base deficit: 0.1 mmol/L (ref 0.0–2.0)
Bicarbonate: 23.9 mmol/L (ref 20.0–28.0)
O2 Saturation: 98.4 %
Patient temperature: 36.5
pCO2 arterial: 35 mmHg (ref 32–48)
pH, Arterial: 7.44 (ref 7.35–7.45)
pO2, Arterial: 119 mmHg — ABNORMAL HIGH (ref 83–108)

## 2022-07-24 LAB — HEPATITIS B SURFACE ANTIGEN: Hepatitis B Surface Ag: NONREACTIVE

## 2022-07-24 LAB — GLUCOSE, CAPILLARY
Glucose-Capillary: 182 mg/dL — ABNORMAL HIGH (ref 70–99)
Glucose-Capillary: 190 mg/dL — ABNORMAL HIGH (ref 70–99)
Glucose-Capillary: 210 mg/dL — ABNORMAL HIGH (ref 70–99)
Glucose-Capillary: 153 mg/dL — ABNORMAL HIGH (ref 70–99)
Glucose-Capillary: 179 mg/dL — ABNORMAL HIGH (ref 70–99)

## 2022-07-24 LAB — HEPATIC FUNCTION PANEL
ALT: 9 U/L (ref 0–44)
AST: 38 U/L (ref 15–41)
Albumin: 2.4 g/dL — ABNORMAL LOW (ref 3.5–5.0)
Alkaline Phosphatase: 141 U/L — ABNORMAL HIGH (ref 38–126)
Bilirubin, Direct: 0.9 mg/dL — ABNORMAL HIGH (ref 0.0–0.2)
Indirect Bilirubin: 1 mg/dL — ABNORMAL HIGH (ref 0.3–0.9)
Total Bilirubin: 1.9 mg/dL — ABNORMAL HIGH (ref 0.3–1.2)
Total Protein: 6.6 g/dL (ref 6.5–8.1)

## 2022-07-24 LAB — PROTIME-INR
INR: 1.4 — ABNORMAL HIGH (ref 0.8–1.2)
Prothrombin Time: 17.3 seconds — ABNORMAL HIGH (ref 11.4–15.2)

## 2022-07-24 LAB — AMMONIA: Ammonia: 25 umol/L (ref 9–35)

## 2022-07-24 MED ORDER — FUROSEMIDE 10 MG/ML IJ SOLN
60.0000 mg | Freq: Two times a day (BID) | INTRAMUSCULAR | Status: DC
  Administered 2022-07-24 – 2022-07-25 (×2): 60 mg via INTRAVENOUS
  Filled 2022-07-24 (×2): qty 6

## 2022-07-24 MED ORDER — FUROSEMIDE 10 MG/ML IJ SOLN: 40.0000 mg | Freq: Two times a day (BID) | INTRAMUSCULAR | Status: DC

## 2022-07-24 MED ORDER — IPRATROPIUM-ALBUTEROL 0.5-2.5 (3) MG/3ML IN SOLN
3.0000 mL | Freq: Three times a day (TID) | RESPIRATORY_TRACT | Status: DC
  Administered 2022-07-24 – 2022-07-31 (×21): 3 mL via RESPIRATORY_TRACT
  Filled 2022-07-24 (×20): qty 3

## 2022-07-24 MED ORDER — MIDODRINE HCL 5 MG PO TABS
5.0000 mg | ORAL_TABLET | Freq: Three times a day (TID) | ORAL | Status: DC
  Administered 2022-07-24 – 2022-07-27 (×10): 5 mg via ORAL
  Filled 2022-07-24 (×10): qty 1

## 2022-07-24 MED ORDER — HEPARIN SODIUM (PORCINE) 5000 UNIT/ML IJ SOLN
INTRAMUSCULAR | Status: AC
  Filled 2022-07-24: qty 1

## 2022-07-24 NOTE — Progress Notes (Signed)
Patient does not want to wear BIPAP tonight. Unit on standby at bedside if needed.

## 2022-07-24 NOTE — Assessment & Plan Note (Addendum)
Renal function with serum cr at 2,17 with K at 4,3 and serum bicarbonate at 21.  Plan to continue with diuresis with IV furosemide Follow up renal function in am, avoid hypotension or nephrotoxic medications.

## 2022-07-24 NOTE — Assessment & Plan Note (Addendum)
Echocardiogram with preserved LV systolic function EF 60 to 65%, interventricular septum is flattened in systole and diastole, consistent with RV pressure and volume overload, RV systolic function with severe reduction, moderate enlargement of RV cavity, RVSP 45.7, no significant valvular disease.   Acute on chronic core pulmonale. Pulmonary hypertension.  Troponin elevation due to heart failure exacerbation, no signs of acute coronary syndrome.   Urine output 5,350 ml over last 24 hrs  Systolic blood pressure is 105 mmHg.   Continue diuresis with furosemide 80 mg IV q12 hrs  Continue with midodrine for blood pressure support.  Patient with severe right heart failure, will consult heart failure team for further recommendations.   Acute hypoxemic respiratory failure due to cardiogenic pulmonary edema and pulmonary hypertension  Continue with high 02 requirements at 8 L to 10 L per high flow [place] with 02 saturation 93% Chest radiograph with persistent bilateral interstitial infiltrates.   Pending V/Q scan to complete work up for pulmonary hypertension.

## 2022-07-24 NOTE — Assessment & Plan Note (Signed)
Patient with no wheezing, and no increased sputum production.  Plan to continue bronchodilator therapy Hold on systemic corticosteroids Continue supplemental 02 per [place] to keel 02 saturation 92% or greater.

## 2022-07-24 NOTE — Assessment & Plan Note (Signed)
Fasting glucose this am is 156   Plan to continue insulin sliding scale for glucose cover and monitoring.

## 2022-07-24 NOTE — Assessment & Plan Note (Addendum)
Patient has been hypotensive Add midodrine for blood pressure support to allow diuresis.  If no improvement may need inotropic support.

## 2022-07-24 NOTE — Progress Notes (Signed)
Patient weaned off bipap at 730am, placed on 7L HFNC, sating at 94%.

## 2022-07-24 NOTE — Progress Notes (Addendum)
Progress Note   Patient: [name] PYP:[id number] DOB: [birth year] DOA: 07/23/2022     1 DOS: the patient was seen and examined on 07/24/2022   Brief hospital course: Mrs. Kluesner was admitted to the hospital with the working diagnosis of heart failure/ COPD decompensation.   61 yo female with the past medical history of COPD, hypertension, dyslipidemia, fibromyalgia and T2DM who presented with dyspnea and lower extremity edema. Reported 3 weeks of dyspnea and cough, her symptoms progressed to lower extremity edema. The night prior to admission she had orthopnea and severe dyspnea that prompted her to come to the ED. On her initial physical examination her blood pressure was 107/47, HR 93, RR 26 and 02 saturation 97%, lungs with scattered wheezing and rales bilaterally, increased work of breathing, heart with S1 and S2 present and rhythmic with no gallops or murmurs, abdomen with no distention and positive lower extremity edema ++.   Na 144, K 4,2 CL 103 bicarbonate 18 glucose 158 bun 39 and cr 2,6  BNP 905  High sensitive troponin 143 and 147  Wbc 13,0 hgb 11,0 plt 230  Sars covid 19 negative  INR 1,4  HIV screen positive   Chest radiograph with mild cardiomegaly, with bilateral hilar vascular congestion and cephalization of the vasculature, fluid in the right fissure.   EKG 99 bpn, normal axis, qtc 511, sinus rhythm with poor R R wave progression, no significant ST segment changes, negative T V1 and V2, low voltage.   Patient was placed on non invasive mechanical ventilation.  Diuresis with furosemide and systemic corticosteroids.     Assessment and Plan: * Acute on chronic diastolic CHF (congestive heart failure) (HCC) Echocardiogram with preserved LV systolic function EF 60 to 65%, interventricular septum is flattened in systole and diastole, consistent with RV pressure and volume overload, RV systolic function with severe reduction, moderate enlargement of RV cavity, RVSP  45.7, no significant valvular disease.   Acute on chronic core pulmonale. Pulmonary hypertension.  Troponin elevation due to heart failure exacerbation, no signs of acute coronary syndrome.   Documented urine output is 245 cc Systolic blood pressure is 100 to 106 mmHg. Continue to have volume overload.  Plant to increase furosemide to 60 mg IV q12 hrs  Add midodrine for blood pressure support.   Acute hypoxemic respiratory failure due to cardiogenic pulmonary edema and pulmonary hypertension  Patient on 10 L per per [place] with 02 saturation 97% Continue diuresis.   Acute exacerbation of COPD with asthma ([place]) Patient with no wheezing, and no increased sputum production.  Plan to continue bronchodilator therapy Hold on systemic corticosteroids Continue supplemental 02 per [place] to keel 02 saturation 92% or greater.   AKI (acute kidney injury) ([place]) Renal function with serum cr at 2,17 with K at 4,3 and serum bicarbonate at 21.  Plan to continue with diuresis with IV furosemide Follow up renal function in am, avoid hypotension or nephrotoxic medications.   Essential hypertension Patient has been hypotensive Add midodrine for blood pressure support to allow diuresis.  If no improvement may need inotropic support.   Acid reflux disease Continue acid suppressive therapy.   Type 2 diabetes mellitus with hyperlipidemia (HCC) Fasting glucose this am is 156   Plan to continue insulin sliding scale for glucose cover and monitoring.   Class 2 obesity Calculated BMI is 36.3   Positive HIV screen will check with ID if need to do confirmatory testing        Subjective: Patient  continue to have dyspnea and edema, she has been off Bipap to nasal cannula 10 L per min   Physical Exam: Vitals:   07/24/22 0800 07/24/22 0845 07/24/22 1130 07/24/22 1413  BP: (!) 94/56  108/63   Pulse: 81  85   Resp:      Temp: 98 F (36.7 C)  97.8 F (36.6 C)   TempSrc:   Oral   SpO2: 95% 97% 91%  95%  Weight:      Height:       Neurology awake and alert ENT with mild pallor Cardiovascular with S1 and S2 present and rhythmic with no gallops rubs or murmurs Respiratory with rhonchi and bilateral rales. Abdomen with no distention  Positive lower extremity edema, ++ pitting  Data Reviewed:    Family Communication: no family at the bedside   Disposition: Status is: Inpatient Remains inpatient appropriate because: heart failure   Planned Discharge Destination: Home    Author: Tawni Millers, MD 07/24/2022 3:45 PM  For on call review [email or web address].

## 2022-07-24 NOTE — Assessment & Plan Note (Signed)
Continue acid suppressive therapy.

## 2022-07-24 NOTE — Hospital Course (Signed)
[name] was admitted to the hospital with the working diagnosis of heart failure/ COPD decompensation.   61 yo female with the past medical history of COPD, hypertension, dyslipidemia, fibromyalgia and T2DM who presented with dyspnea and lower extremity edema. Reported 3 weeks of dyspnea and cough, her symptoms progressed to lower extremity edema. The night prior to admission she had orthopnea and severe dyspnea that prompted her to come to the ED. On her initial physical examination her blood pressure was 107/47, HR 93, RR 26 and 02 saturation 97%, lungs with scattered wheezing and rales bilaterally, increased work of breathing, heart with S1 and S2 present and rhythmic with no gallops or murmurs, abdomen with no distention and positive lower extremity edema ++.   Na 144, K 4,2 CL 103 bicarbonate 18 glucose 158 bun 39 and cr 2,6  BNP 905  High sensitive troponin 143 and 147  Wbc 13,0 hgb 11,0 plt 230  Sars covid 19 negative  INR 1,4  HIV screen positive   Chest radiograph with mild cardiomegaly, with bilateral hilar vascular congestion and cephalization of the vasculature, fluid in the right fissure.   EKG 99 bpn, normal axis, qtc 511, sinus rhythm with poor R R wave progression, no significant ST segment changes, negative T V1 and V2, low voltage.   Patient was placed on non invasive mechanical ventilation.  Diuresis with furosemide and systemic corticosteroids.   11/04 added midodrine to allow better diuresis in the setting of hypotension.  11/05 patient with severe RV failure, continue diuresing.  11/06 cardiac catheterization with improved filling pressures 11/07 high resolution CT to work up ILD.

## 2022-07-24 NOTE — Progress Notes (Signed)
PT Cancellation Note  Patient Details Name: [name] MRN: [id number] DOB: 07-23-1961   Cancelled Treatment:    Reason Eval/Treat Not Completed: Medical issues which prohibited therapy (Hold for stat VQ scan for concern for acute PE. Will follow up as schedule allows and pt able.)   Gwynneth Albright PT, DPT Acute Rehabilitation Services Office [phone]  07/24/22 10:57 AM

## 2022-07-24 NOTE — Assessment & Plan Note (Signed)
Calculated BMI is 36.3

## 2022-07-25 ENCOUNTER — Encounter (HOSPITAL_COMMUNITY): Payer: Self-pay | Admitting: Internal Medicine

## 2022-07-25 DIAGNOSIS — I5033 Acute on chronic diastolic (congestive) heart failure: Secondary | ICD-10-CM | POA: Diagnosis not present

## 2022-07-25 DIAGNOSIS — I10 Essential (primary) hypertension: Secondary | ICD-10-CM | POA: Diagnosis not present

## 2022-07-25 DIAGNOSIS — J441 Chronic obstructive pulmonary disease with (acute) exacerbation: Secondary | ICD-10-CM | POA: Diagnosis not present

## 2022-07-25 DIAGNOSIS — N179 Acute kidney failure, unspecified: Secondary | ICD-10-CM | POA: Diagnosis not present

## 2022-07-25 LAB — RESPIRATORY PANEL BY PCR

## 2022-07-25 LAB — GLUCOSE, CAPILLARY
Glucose-Capillary: 135 mg/dL — ABNORMAL HIGH (ref 70–99)
Glucose-Capillary: 124 mg/dL — ABNORMAL HIGH (ref 70–99)
Glucose-Capillary: 142 mg/dL — ABNORMAL HIGH (ref 70–99)
Glucose-Capillary: 147 mg/dL — ABNORMAL HIGH (ref 70–99)

## 2022-07-25 LAB — BASIC METABOLIC PANEL
Anion gap: 16 — ABNORMAL HIGH (ref 5–15)
BUN: 58 mg/dL — ABNORMAL HIGH (ref 8–23)
CO2: 21 mmol/L — ABNORMAL LOW (ref 22–32)
Calcium: 8.3 mg/dL — ABNORMAL LOW (ref 8.9–10.3)
Chloride: 103 mmol/L (ref 98–111)
Creatinine, Ser: 1.69 mg/dL — ABNORMAL HIGH (ref 0.44–1.00)
GFR, Estimated: 34 mL/min — ABNORMAL LOW (ref >60)
Glucose, Bld: 125 mg/dL — ABNORMAL HIGH (ref 70–99)
Potassium: 3.8 mmol/L (ref 3.5–5.1)
Sodium: 140 mmol/L (ref 135–145)

## 2022-07-25 LAB — CREATININE, URINE, RANDOM: Creatinine, Urine: 26 mg/dL

## 2022-07-25 MED ORDER — CHLORHEXIDINE GLUCONATE CLOTH 2 % EX PADS
6.0000 | MEDICATED_PAD | Freq: Every day | CUTANEOUS | Status: DC
  Administered 2022-07-25 – 2022-08-02 (×7): 6 via TOPICAL

## 2022-07-25 MED ORDER — FUROSEMIDE 10 MG/ML IJ SOLN
80.0000 mg | Freq: Two times a day (BID) | INTRAMUSCULAR | Status: DC
  Administered 2022-07-25 – 2022-07-27 (×4): 80 mg via INTRAVENOUS
  Filled 2022-07-25 (×4): qty 8

## 2022-07-25 NOTE — Significant Event (Signed)
Pt desat to 88 on 8L HFNC increased to 9L sats improved to 90-92%

## 2022-07-25 NOTE — Progress Notes (Signed)
RT called because patient stated BIPAP wasn't working and she couldn't breath. Upon arrival machine working appropriately and no alarms were going off. RN in room. Patient stated she had just gotten up to weigh. RT increased pressures to help patient catch her breath. Patient's RR slowed down and her O2 sats went back up.

## 2022-07-25 NOTE — Progress Notes (Signed)
Patient requested to have BIPAP removed and to be placed on nasal canula.  RN placed patient on High Flow nasal canula 10L, patient continues to have O2 sat from 83%, patient is sleeping and mouth breathing.  Patients o2 sat comes up 90s when awake.    RN asked patient to go back on BIPAP due to low O2 sats however patient refused and understands that when is asleep that is in the 80s

## 2022-07-25 NOTE — Progress Notes (Addendum)
Progress Note   Patient: [name] WUJ:[id number] DOB: 1960/11/11 DOA: 07/23/2022     2 DOS: the patient was seen and examined on 07/25/2022   Brief hospital course: [name] was admitted to the hospital with the working diagnosis of heart failure/ COPD decompensation.   61 yo female with the past medical history of COPD, hypertension, dyslipidemia, fibromyalgia and T2DM who presented with dyspnea and lower extremity edema. Reported 3 weeks of dyspnea and cough, her symptoms progressed to lower extremity edema. The night prior to admission she had orthopnea and severe dyspnea that prompted her to come to the ED. On her initial physical examination her blood pressure was 107/47, HR 93, RR 26 and 02 saturation 97%, lungs with scattered wheezing and rales bilaterally, increased work of breathing, heart with S1 and S2 present and rhythmic with no gallops or murmurs, abdomen with no distention and positive lower extremity edema ++.   Na 144, K 4,2 CL 103 bicarbonate 18 glucose 158 bun 39 and cr 2,6  BNP 905  High sensitive troponin 143 and 147  Wbc 13,0 hgb 11,0 plt 230  Sars covid 19 negative  INR 1,4  HIV screen positive   Chest radiograph with mild cardiomegaly, with bilateral hilar vascular congestion and cephalization of the vasculature, fluid in the right fissure.   EKG 99 bpn, normal axis, qtc 511, sinus rhythm with poor R R wave progression, no significant ST segment changes, negative T V1 and V2, low voltage.   Patient was placed on non invasive mechanical ventilation.  Diuresis with furosemide and systemic corticosteroids.   11/04 added midodrine to allow better diuresis in the setting of hypotension.   Assessment and Plan: * Acute on chronic diastolic CHF (congestive heart failure) (HCC) Echocardiogram with preserved LV systolic function EF 60 to 65%, interventricular septum is flattened in systole and diastole, consistent with RV pressure and volume overload, RV  systolic function with severe reduction, moderate enlargement of RV cavity, RVSP 45.7, no significant valvular disease.   Acute on chronic core pulmonale. Pulmonary hypertension.  Troponin elevation due to heart failure exacerbation, no signs of acute coronary syndrome.   Documented urine output is 9,562 cc Systolic blood pressure is 129 to 105 mmHg. Continue to have volume overload.  Increase furosemide to 80 mg IV q12 hrs  Continue with midodrine for blood pressure support.   Acute hypoxemic respiratory failure due to cardiogenic pulmonary edema and pulmonary hypertension  Continue with high 02 requirements at 8 L per per [place] with 02 saturation 90% Last night required Bipap for dyspnea.  Continue diuresis.  Pending V/Q scan to complete work up for pulmonary hypertension.   Acute exacerbation of COPD with asthma ([place]) Patient with no wheezing, and no increased sputum production.  Plan to continue bronchodilator therapy Continue supplemental 02 per [place] to keel 02 saturation 92% or greater.  No current indication for systemic corticosteroids.   AKI (acute kidney injury) ([place]) Patient continue volume overloaded, renal function with serum cr at 1,69 with K at 3,8 and serum bicarbonate at 21.  Plan to continue diuresis with furosemide, will increase dose to 80 IV q12 hrs.  Follow up renal function in am, avoid hypotension and nephrotoxic medications.   Essential hypertension Continue blood pressure support with midodrine.   Acid reflux disease Continue acid suppressive therapy.   Type 2 diabetes mellitus with hyperlipidemia (HCC) Fasting glucose this am is 125 mg/dl.   Plan to continue insulin sliding scale for glucose cover and monitoring.  Class 2 obesity Calculated BMI is 36.3         Subjective: patient with persistent dyspnea, more last night, no chest pain, edema has been improving.   Physical Exam: Vitals:   07/25/22 0555 07/25/22 0600 07/25/22 0737 07/25/22  0850  BP: 129/74  105/62   Pulse: 82  80   Resp: (!) 26 (!) 22 19   Temp:   97.7 F (36.5 C)   TempSrc:   Oral   SpO2: 93%  90% (!) 89%  Weight:      Height:       Neurology awake and alert ENT with mild pallor Cardiovascular with S1 and S2 present and rhythmic with no gallops, rubs or murmurs Respiratory with rales bilaterally at the dependent zones with no wheezing Abdomen with no distention  Positive lower extremity edema ++  Data Reviewed:    Family Communication: no family at the bedside   Disposition: Status is: Inpatient Remains inpatient appropriate because: heart failure   Planned Discharge Destination: Home      Author: Tawni Millers, MD 07/25/2022 10:08 AM  For on call review [email or web address].

## 2022-07-25 NOTE — Evaluation (Addendum)
Physical Therapy Evaluation Patient Details Name: [name] MRN: [id number] DOB: 05-30-1961 Today's Date: 07/25/2022  History of Present Illness  Pt is a 61 y.o. F who presents 07/23/2022 with dyspnea and LE edema. Working diagnosis of heart failure/COPD decompensation. Significant PMH: COPD, HTN, dyslipidemia, fibromyalgia, DM2.  Clinical Impression  Pt admitted with above. PTA, pt lives with her mother and is independent. Presents with decreased cardiopulmonary endurance and mild balance deficits. Pt received on 9L O2 via HFNC, SpO2 89%. Desat to 84% sitting EOB and bumped up to 10L. With stand pivot transfer to chair, desat to 79%, with rebound to 90% after ~2-3 minutes of seated rest break. Suspect pt will progress well once pulmonary function improves and with continued diuresis. Will continue to progress as tolerated.      Recommendations for follow up therapy are one component of a multi-disciplinary discharge planning process, led by the attending physician.  Recommendations may be updated based on patient status, additional functional criteria and insurance authorization.  Follow Up Recommendations No PT follow up (pending progress)      Assistance Recommended at Discharge PRN  Patient can return home with the following  A little help with walking and/or transfers;Assistance with cooking/housework;Assist for transportation;Help with stairs or ramp for entrance    Equipment Recommendations Rollator (4 wheels)  Recommendations for Other Services       Functional Status Assessment Patient has had a recent decline in their functional status and demonstrates the ability to make significant improvements in function in a reasonable and predictable amount of time.     Precautions / Restrictions Precautions Precautions: Fall;Other (comment) Precaution Comments: watch O2 Restrictions Weight Bearing Restrictions: No      Mobility  Bed Mobility Overal bed mobility: Modified  Independent                  Transfers Overall transfer level: Needs assistance Equipment used: None Transfers: Sit to/from Stand, Bed to chair/wheelchair/BSC Sit to Stand: Min guard Stand pivot transfers: Min guard         General transfer comment: Min guard for safety, mild dynamic instability    Ambulation/Gait               General Gait Details: unable due to desaturation  Stairs            Wheelchair Mobility    Modified Rankin (Stroke Patients Only)       Balance Overall balance assessment: Mild deficits observed, not formally tested                                           Pertinent Vitals/Pain Pain Assessment Pain Assessment: Faces Faces Pain Scale: Hurts whole lot Pain Location: headache Pain Descriptors / Indicators: Headache Pain Intervention(s): Monitored during session    Home Living Family/patient expects to be discharged to:: Private residence Living Arrangements: Parent (mother) Available Help at Discharge: Family Type of Home: House Home Access: Ramped entrance       Home Layout: One level Home Equipment: None (mother has DME) Additional Comments: Mother is independent, but does not drive; pt reports also has children available. Has small dog    Prior Function Prior Level of Function : Independent/Modified Independent;Driving             Mobility Comments: Does not work       Journalist, newspaper  Extremity/Trunk Assessment   Upper Extremity Assessment Upper Extremity Assessment: Overall WFL for tasks assessed    Lower Extremity Assessment Lower Extremity Assessment: Overall WFL for tasks assessed       Communication   Communication: No difficulties  Cognition Arousal/Alertness: Awake/alert Behavior During Therapy: WFL for tasks assessed/performed Overall Cognitive Status: Within Functional Limits for tasks assessed                                           General Comments      Exercises General Exercises - Lower Extremity Long Arc Quad: Both, 10 reps, Seated Hip Flexion/Marching: Both, 5 reps, Seated Toe Raises: Both, 10 reps, Seated Heel Raises: Both, 10 reps, Seated Other Exercises Other Exercises: IS x 3 (pt pulling ~750 ml)   Assessment/Plan    PT Assessment Patient needs continued PT services  PT Problem List Decreased strength;Decreased activity tolerance;Decreased balance;Decreased mobility;Cardiopulmonary status limiting activity       PT Treatment Interventions DME instruction;Gait training;Functional mobility training;Therapeutic activities;Therapeutic exercise;Balance training    PT Goals (Current goals can be found in the Care Plan section)  Acute Rehab PT Goals Patient Stated Goal: did not state PT Goal Formulation: With patient Time For Goal Achievement: 08/08/22 Potential to Achieve Goals: Good    Frequency Min 3X/week     Co-evaluation               AM-PAC PT "6 Clicks" Mobility  Outcome Measure Help needed turning from your back to your side while in a flat bed without using bedrails?: None Help needed moving from lying on your back to sitting on the side of a flat bed without using bedrails?: None Help needed moving to and from a bed to a chair (including a wheelchair)?: A Little Help needed standing up from a chair using your arms (e.g., wheelchair or bedside chair)?: A Little Help needed to walk in hospital room?: A Little Help needed climbing 3-5 steps with a railing? : A Lot 6 Click Score: 19    End of Session Equipment Utilized During Treatment: Oxygen Activity Tolerance: Patient tolerated treatment well Patient left: in chair;with call bell/phone within reach;with chair alarm set Nurse Communication: Mobility status;Other (comment) (sats) PT Visit Diagnosis: Unsteadiness on feet (R26.81);Difficulty in walking, not elsewhere classified (R26.2)    Time: 1040-1101 PT Time Calculation  (min) (ACUTE ONLY): 21 min   Charges:   PT Evaluation $PT Eval Moderate Complexity: 1 Mod          Wyona Almas, PT, DPT Acute Rehabilitation Services Office [phone]   Deno Etienne 07/25/2022, 11:49 AM

## 2022-07-25 NOTE — Progress Notes (Signed)
Patient found off BIPAP on 10L nasal cannula, O2 sats were 85%. Placed patient back on BIPAP. Patient was asleep and mouth breathing.

## 2022-07-25 NOTE — Progress Notes (Signed)
Patient had to be put back on BIPAP due to low O2 saturations during sleep and mouth breathing. Patient agreeable and understands.

## 2022-07-26 ENCOUNTER — Inpatient Hospital Stay (HOSPITAL_COMMUNITY): Payer: HMO

## 2022-07-26 ENCOUNTER — Encounter (HOSPITAL_COMMUNITY)
Admission: EM | Disposition: A | Discharge status: Home / Self Care | Payer: Self-pay | Source: Home / Self Care | Attending: Internal Medicine

## 2022-07-26 DIAGNOSIS — I10 Essential (primary) hypertension: Secondary | ICD-10-CM | POA: Diagnosis not present

## 2022-07-26 DIAGNOSIS — J441 Chronic obstructive pulmonary disease with (acute) exacerbation: Secondary | ICD-10-CM | POA: Diagnosis not present

## 2022-07-26 DIAGNOSIS — I272 Pulmonary hypertension, unspecified: Secondary | ICD-10-CM

## 2022-07-26 DIAGNOSIS — I5033 Acute on chronic diastolic (congestive) heart failure: Secondary | ICD-10-CM | POA: Diagnosis not present

## 2022-07-26 DIAGNOSIS — N179 Acute kidney failure, unspecified: Secondary | ICD-10-CM | POA: Diagnosis not present

## 2022-07-26 HISTORY — PX: RIGHT HEART CATH: CATH118263

## 2022-07-26 LAB — POCT I-STAT EG7
Acid-Base Excess: 7 mmol/L — ABNORMAL HIGH (ref 0.0–2.0)
Acid-Base Excess: 7 mmol/L — ABNORMAL HIGH (ref 0.0–2.0)
Bicarbonate: 30.8 mmol/L — ABNORMAL HIGH (ref 20.0–28.0)
Bicarbonate: 31 mmol/L — ABNORMAL HIGH (ref 20.0–28.0)
Calcium, Ion: 1.12 mmol/L — ABNORMAL LOW (ref 1.15–1.40)
Calcium, Ion: 1.13 mmol/L — ABNORMAL LOW (ref 1.15–1.40)
HCT: 35 % — ABNORMAL LOW (ref 36.0–46.0)
HCT: 36 % (ref 36.0–46.0)
Hemoglobin: 11.9 g/dL — ABNORMAL LOW (ref 12.0–15.0)
Hemoglobin: 12.2 g/dL (ref 12.0–15.0)
O2 Saturation: 51 %
O2 Saturation: 53 %
Potassium: 4.2 mmol/L (ref 3.5–5.1)
Potassium: 4.2 mmol/L (ref 3.5–5.1)
Sodium: 140 mmol/L (ref 135–145)
Sodium: 141 mmol/L (ref 135–145)
TCO2: 32 mmol/L (ref 22–32)
TCO2: 32 mmol/L (ref 22–32)
pCO2, Ven: 39.7 mmHg — ABNORMAL LOW (ref 44–60)
pCO2, Ven: 39.7 mmHg — ABNORMAL LOW (ref 44–60)
pH, Ven: 7.498 — ABNORMAL HIGH (ref 7.25–7.43)
pH, Ven: 7.501 — ABNORMAL HIGH (ref 7.25–7.43)
pO2, Ven: 25 mmHg — CL (ref 32–45)
pO2, Ven: 26 mmHg — CL (ref 32–45)

## 2022-07-26 LAB — GLUCOSE, CAPILLARY
Glucose-Capillary: 131 mg/dL — ABNORMAL HIGH (ref 70–99)
Glucose-Capillary: 120 mg/dL — ABNORMAL HIGH (ref 70–99)
Glucose-Capillary: 138 mg/dL — ABNORMAL HIGH (ref 70–99)

## 2022-07-26 LAB — BASIC METABOLIC PANEL
Anion gap: 11 (ref 5–15)
BUN: 45 mg/dL — ABNORMAL HIGH (ref 8–23)
CO2: 27 mmol/L (ref 22–32)
Calcium: 8.5 mg/dL — ABNORMAL LOW (ref 8.9–10.3)
Chloride: 101 mmol/L (ref 98–111)
Creatinine, Ser: 1.46 mg/dL — ABNORMAL HIGH (ref 0.44–1.00)
GFR, Estimated: 40 mL/min — ABNORMAL LOW (ref >60)
Glucose, Bld: 131 mg/dL — ABNORMAL HIGH (ref 70–99)
Potassium: 3.4 mmol/L — ABNORMAL LOW (ref 3.5–5.1)
Sodium: 139 mmol/L (ref 135–145)

## 2022-07-26 LAB — HIV ANTIBODY (ROUTINE TESTING W REFLEX): HIV Screen 4th Generation wRfx: REACTIVE — AB

## 2022-07-26 SURGERY — RIGHT HEART CATH
Anesthesia: LOCAL

## 2022-07-26 MED ORDER — HEPARIN (PORCINE) IN NACL 1000-0.9 UT/500ML-% IV SOLN
INTRAVENOUS | Status: AC
  Filled 2022-07-26: qty 500

## 2022-07-26 MED ORDER — HEPARIN (PORCINE) 25000 UT/250ML-% IV SOLN
1000.0000 [IU]/h | INTRAVENOUS | Status: DC
  Administered 2022-07-26: 1000 [IU]/h via INTRAVENOUS
  Filled 2022-07-26: qty 250

## 2022-07-26 MED ORDER — LIDOCAINE HCL (PF) 1 % IJ SOLN
INTRAMUSCULAR | Status: DC | PRN
  Administered 2022-07-26: 2 mL

## 2022-07-26 MED ORDER — SODIUM CHLORIDE 0.9% FLUSH
3.0000 mL | Freq: Two times a day (BID) | INTRAVENOUS | Status: DC
  Administered 2022-07-26 – 2022-08-04 (×18): 3 mL via INTRAVENOUS

## 2022-07-26 MED ORDER — POTASSIUM CHLORIDE CRYS ER 20 MEQ PO TBCR: 40.0000 meq | EXTENDED_RELEASE_TABLET | Freq: Two times a day (BID) | ORAL | Status: DC

## 2022-07-26 MED ORDER — SODIUM CHLORIDE 0.9 % IV SOLN: INTRAVENOUS | Status: DC

## 2022-07-26 MED ORDER — APIXABAN 5 MG PO TABS
5.0000 mg | ORAL_TABLET | Freq: Two times a day (BID) | ORAL | Status: DC
  Administered 2022-07-26 – 2022-08-04 (×18): 5 mg via ORAL
  Filled 2022-07-26 (×18): qty 1

## 2022-07-26 MED ORDER — METOPROLOL TARTRATE 5 MG/5ML IV SOLN: 5.0000 mg | Freq: Four times a day (QID) | INTRAVENOUS | Status: DC | PRN

## 2022-07-26 MED ORDER — SODIUM CHLORIDE 0.9 % IV SOLN: 250.0000 mL | INTRAVENOUS | Status: DC | PRN

## 2022-07-26 MED ORDER — TECHNETIUM TO 99M ALBUMIN AGGREGATED
3.8000 | Freq: Once | INTRAVENOUS | Status: AC | PRN
  Administered 2022-07-26: 3.8 via INTRAVENOUS

## 2022-07-26 MED ORDER — HEPARIN BOLUS VIA INFUSION
2000.0000 [IU] | Freq: Once | INTRAVENOUS | Status: AC
  Administered 2022-07-26: 2000 [IU] via INTRAVENOUS
  Filled 2022-07-26: qty 2000

## 2022-07-26 MED ORDER — HEPARIN (PORCINE) 25000 UT/250ML-% IV SOLN: 1000.0000 [IU]/h | INTRAVENOUS | Status: DC

## 2022-07-26 MED ORDER — LIDOCAINE HCL (PF) 1 % IJ SOLN
INTRAMUSCULAR | Status: AC
  Filled 2022-07-26: qty 30

## 2022-07-26 MED ORDER — SODIUM CHLORIDE 0.9% FLUSH: 3.0000 mL | INTRAVENOUS | Status: DC | PRN

## 2022-07-26 MED ORDER — POTASSIUM CHLORIDE CRYS ER 20 MEQ PO TBCR
40.0000 meq | EXTENDED_RELEASE_TABLET | Freq: Two times a day (BID) | ORAL | Status: AC
  Administered 2022-07-26 (×2): 40 meq via ORAL
  Filled 2022-07-26 (×2): qty 2

## 2022-07-26 MED ORDER — HEPARIN (PORCINE) IN NACL 1000-0.9 UT/500ML-% IV SOLN
INTRAVENOUS | Status: DC | PRN
  Administered 2022-07-26: 500 mL

## 2022-07-26 SURGICAL SUPPLY — 7 items
CATH SWAN GANZ 7F STRAIGHT (CATHETERS) IMPLANT
GLIDESHEATH SLENDER 7FR .021G (SHEATH) IMPLANT
KIT HEART LEFT (KITS) ×1 IMPLANT
PACK CARDIAC CATHETERIZATION (CUSTOM PROCEDURE TRAY) ×1 IMPLANT
PROTECTION STATION PRESSURIZED (MISCELLANEOUS) ×1
STATION PROTECTION PRESSURIZED (MISCELLANEOUS) IMPLANT
TRANSDUCER W/STOPCOCK (MISCELLANEOUS) ×1 IMPLANT

## 2022-07-26 NOTE — Progress Notes (Signed)
Pt back from Heart Cath, VSS, Right brachial clean dry and intact, call light within reach  Alvis Lemmings, RN 07/26/2022 5:11 PM

## 2022-07-26 NOTE — Consult Note (Signed)
   [facility] Inpatient Consult   07/26/2022  JACKALYNN ART [birth year] [id number]  [facility] [[facility]] Patient: [employer]   Primary Care Provider:  Ginger Organ., MD   2:30 pm Currently, off the unit.  Will follow   Plan:  Continue to follow progress and disposition to assess for post hospital community care coordination/management needs.  Referral request for community care coordination can be made ongoing: followup  Of note, [place] does not replace or interfere with any arrangements made by the Inpatient Transition of Care team.  For questions contact:   Natividad Brood, RN BSN [place]  [phone] business mobile phone Toll free office [phone]  *[place]  [phone] Fax number: [phone] [email or web address]

## 2022-07-26 NOTE — Progress Notes (Signed)
Placed patient on BIPAP due to sustained desaturation <88%.

## 2022-07-26 NOTE — Assessment & Plan Note (Signed)
Depression,  Continue with aripiprazole, clonazepam

## 2022-07-26 NOTE — Consult Note (Addendum)
Advanced Heart Failure Team Consult Note   Primary Physician: Ginger Organ., MD PCP-Cardiologist:  None  Reason for Consultation: acute RV Failure   HPI:    [name] is seen today for evaluation of acute RV failure at the request of Dr. Cathlean Sauer.   61 y/o female smoker w/ h/o COPD, HTN, HLD, Type 2DM and chronic pain syndrome/ fibromyalgia, who presented to ED on 11/3 w/ complains of 3 wk h/o progressive SOB, cough and wheezing, unimproved w/ inhalers, followed by development of b/l LEE edema.   In ED, found to be in respiratory distress and tachypneic, initially required 6L [place] and later escalated to BiPAP. EKG showed borderline sinus tach, 99 bpm w/ RAD. No acute ST changes. HS trop 143>>147. BNP 905. CXR w/ mild diffuse interstitial densities bilaterally c/w pulmonary edema vs possible atypical inflammation. WBC mildly elevated at 13K. Respiratory panel negative.   Unable to get Chest CT due to AKI, SCr elevated at 2.6 on admit, b/l < 1.0. VQ scan pending. LE venous dopplers negative for DVT.  Admitted for acute hypoxic respiratory failure, felt 2/2 acute CHF and COPDE. Also acidotic w/ Bicarb of 18. SCr 2.6, K 4.2.  Started on IV Lasix, steroids and nebs.   2D Echo showed severe RV failure.  LVEF 60-65%, GIDD, severely reduced RV systolic function and possible McConnell's sign,  RA mod dilated, no MR. IVC dilated w/ <50% respiratory variability, suggesting right atrial pressure of 15 mmHg.  CMP w/ normal AST, ALT but elevated Tbili, 1.9. Albumin low at 2.4. NH3 nl.   RUQ [place] limited due to patient habitus but suggestive of possible liver disease/cirrhosis (coarsened hepatic echotexture).  Diuresis initially limited by soft BP. Pt started on midodrine, currently 5 mg tid. BP improved. Diuresis picking up, > 5 L out yesterday. SCr improving w/ diuresis  2.17>>1.69>>1.46. K 3.4   Developed afib w/ RVR overnight, 150s.   Etiology of RV failure still uncertain. VQ and  PFTs ordered. AHF team asked to assist w/ RHC and further management.   She is now off BiPAP but remains on HF [place], 9 L/min w/ O2 sats 92%.   HIV also reactive. Further testing pending.      Echo 07/23/22 1. Possible McConnell's sign; would consider evaluation to exclude pulmonary embolus. Left ventricular ejection fraction, by estimation, is 60 to 65%. The left ventricle has normal function. The left ventricle has no regional wall motion abnormalities. Left ventricular diastolic parameters are consistent with Grade I diastolic dysfunction (impaired relaxation). There is the interventricular septum is flattened in systole and diastole, consistent with right ventricular pressure and volume overload. 2. Right ventricular systolic function is severely reduced. The right ventricular size is moderately enlarged. There is moderately elevated pulmonary artery systolic pressure. 3. 4. Right atrial size was moderately dilated. The mitral valve is normal in structure. No evidence of mitral valve regurgitation. No evidence of mitral stenosis. 5. The aortic valve is tricuspid. Aortic valve regurgitation is not visualized. Aortic valve sclerosis is present, with no evidence of aortic valve stenosis. 6. The inferior vena cava is dilated in size with <50% respiratory variability, suggesting right atrial pressure of 15 mmHg.  Review of Systems: [y] = yes, '[ ]'$  = no   General: Weight gain '[ ]'$ ; Weight loss '[ ]'$ ; Anorexia '[ ]'$ ; Fatigue '[ ]'$ ; Fever '[ ]'$ ; Chills '[ ]'$ ; Weakness '[ ]'$   Cardiac: Chest pain/pressure '[ ]'$ ; Resting SOB '[ ]'$ ; Exertional SOB [Y ]; Orthopnea '[ ]'$ ;  Pedal Edema [ Y]; Palpitations '[ ]'$ ; Syncope '[ ]'$ ; Presyncope '[ ]'$ ; Paroxysmal nocturnal dyspnea'[ ]'$   Pulmonary: Cough [Y ]; Wheezing[Y ]; Hemoptysis'[ ]'$ ; Sputum [Y ]; Snoring [ Y]  GI: Vomiting'[ ]'$ ; Dysphagia'[ ]'$ ; Melena'[ ]'$ ; Hematochezia '[ ]'$ ; Heartburn'[ ]'$ ; Abdominal pain '[ ]'$ ; Constipation '[ ]'$ ; Diarrhea '[ ]'$ ; BRBPR '[ ]'$   GU: Hematuria'[ ]'$ ; Dysuria '[ ]'$ ;  Nocturia'[ ]'$   Vascular: Pain in legs with walking '[ ]'$ ; Pain in feet with lying flat '[ ]'$ ; Non-healing sores '[ ]'$ ; Stroke '[ ]'$ ; TIA '[ ]'$ ; Slurred speech '[ ]'$ ;  Neuro: Headaches'[ ]'$ ; Vertigo'[ ]'$ ; Seizures'[ ]'$ ; Paresthesias'[ ]'$ ;Blurred vision '[ ]'$ ; Diplopia '[ ]'$ ; Vision changes '[ ]'$   Ortho/Skin: Arthritis '[ ]'$ ; Joint pain '[ ]'$ ; Muscle pain '[ ]'$ ; Joint swelling '[ ]'$ ; Back Pain '[ ]'$ ; Rash '[ ]'$   Psych: Depression'[ ]'$ ; Anxiety'[ ]'$   Heme: Bleeding problems '[ ]'$ ; Clotting disorders '[ ]'$ ; Anemia '[ ]'$   Endocrine: Diabetes '[ ]'$ ; Thyroid dysfunction'[ ]'$   Home Medications Prior to Admission medications   Medication Sig Start Date End Date Taking? Authorizing Provider  acetaminophen (TYLENOL) 500 MG tablet Take 500 mg by mouth every 6 (six) hours as needed.   Yes [provider]  ARIPiprazole (ABILIFY) 5 MG tablet Take 1 tablet (5 mg total) by mouth daily. 05/06/22  Yes Arfeen, Arlyce Harman, MD  aspirin EC 81 MG tablet Take one (1) tablet by mouth daily Oral 10/23/15  Yes [provider]  atorvastatin (LIPITOR) 40 MG tablet Take 40 mg by mouth at bedtime.  01/17/14  Yes [provider]  clonazePAM (KLONOPIN) 1 MG tablet Take 1 tab twice daily 05/06/22  Yes Arfeen, Arlyce Harman, MD  dicyclomine (BENTYL) 10 MG capsule Take 10 mg by mouth every 8 (eight) hours as needed for spasms (abdominal spasms).    Yes [provider]  doxepin (SINEQUAN) 100 MG capsule Take 1 capsule (100 mg total) by mouth at bedtime. 05/06/22 05/06/23 Yes Arfeen, Arlyce Harman, MD  FLUoxetine (PROZAC) 40 MG capsule TAKE 1 CAPSULE BY MOUTH EVERY DAY Patient taking differently: Take 40 mg by mouth daily. 05/06/22  Yes Arfeen, Arlyce Harman, MD  lisinopril (PRINIVIL,ZESTRIL) 20 MG tablet Take 20 mg by mouth daily.  06/15/17  Yes [provider]  omeprazole (PRILOSEC) 40 MG capsule Take 40 mg by mouth daily before breakfast.  06/07/17  Yes [provider]  OVER THE COUNTER MEDICATION Naproxen   Yes [provider]  promethazine (PHENERGAN)  25 MG tablet Take 25 mg by mouth See admin instructions. Every 12 hours PRN 06/23/22  Yes [provider]  VENTOLIN HFA 108 (90 BASE) MCG/ACT inhaler Inhale 2 puffs into the lungs every 6 (six) hours as needed for wheezing or shortness of breath.  07/05/13  Yes [provider]  furosemide (LASIX) 20 MG tablet Take 20 mg by mouth daily as needed. Patient not taking: Reported on 07/23/2022 01/17/21   [provider]  [facility] VERIO test strip CHECK SUGARS TWICE A DAY DX  DM2, INSULIN DEPENDANT 05/15/19   [provider]  prochlorperazine (COMPAZINE) 10 MG tablet Take 1 tablet (10 mg total) by mouth every 6 (six) hours as needed for nausea or vomiting. Patient not taking: Reported on 03/04/2021 06/14/19   Gery Pray, MD    Past Medical History: Past Medical History:  Diagnosis Date   Acid reflux disease    Anxiety    Asthma    Bronchitis, chronic (HCC)    Effects worse  with URI   Chronic pain syndrome    COPD (chronic obstructive pulmonary disease) (HCC)    Depression    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, type 2   Diverticulitis    Dysplasia of cervix (uteri)    patient states she had dysplasia of the uterus stage 4   Endometrial cancer (HCC)    Fibromyalgia    Fibromyalgia    GERD (gastroesophageal reflux disease)    Headache(784.0)    migraine headache   Hyperlipidemia    Hypertension    Kidney stones    at least 6 stones in past   Pneumonia 2019   Restless leg syndrome    Bilateral   Vomiting    last night at 2300 after last round of antibiotic    Past Surgical History: Past Surgical History:  Procedure Laterality Date   ABDOMINAL HYSTERECTOMY     APPENDECTOMY     APPLICATION OF WOUND VAC N/A 11/01/2012   Procedure: APPLICATION OF WOUND VAC;  Surgeon: Joyice Faster. Cornett, MD;  Location: [facility];  Service: General;  Laterality: N/A;   CHOLECYSTECTOMY     COLONOSCOPY  2013   COLOSTOMY     COLOSTOMY CLOSURE N/A  11/01/2012   Procedure: COLOSTOMY CLOSURE;  Surgeon: Joyice Faster. Cornett, MD;  Location: [facility];  Service: General;  Laterality: N/A;  REPAIR PARASTOMAL HERNIA; REVISION OF COLOSTOMY   COLOSTOMY CLOSURE N/A 05/03/2013   Procedure: COLOSTOMY CLOSURE;  Surgeon: Joyice Faster. Cornett, MD;  Location: [place];  Service: General;  Laterality: N/A;   DILATION AND CURETTAGE OF UTERUS N/A 05/24/2019   Procedure: DILATATION AND CURETTAGE;  Surgeon: Gery Pray, MD;  Location: [facility];  Service: Gynecology;  Laterality: N/A;   ESOPHAGOGASTRODUODENOSCOPY Left 11/27/2012   Procedure: ESOPHAGOGASTRODUODENOSCOPY (EGD);  Surgeon: Winfield Cunas., MD;  Location: [facility];  Service: Gastroenterology;  Laterality: Left;   INCISIONAL HERNIA REPAIR N/A 11/01/2012   Procedure: HERNIA REPAIR INCISIONAL;  Surgeon: Joyice Faster. Cornett, MD;  Location: [facility];  Service: General;  Laterality: N/A;   LAPAROTOMY  11/01/2012   Procedure: EXPLORATORY LAPAROTOMY;  Surgeon: Joyice Faster. Cornett, MD;  Location: [facility];  Service: General;;  exploratory laparotomy,repair of parastomal hernia and incisional hernia, lysis of adhesions, and application of wound V.A.C.   LYSIS OF ADHESION N/A 11/01/2012   Procedure: LYSIS OF ADHESION;  Surgeon: Joyice Faster. Cornett, MD;  Location: [facility];  Service: General;  Laterality: N/A;   OPERATIVE ULTRASOUND N/A 06/14/2019   Procedure: OPERATIVE ULTRASOUND;  Surgeon: Gery Pray, MD;  Location: [facility];  Service: Urology;  Laterality: N/A;   PARASTOMAL HERNIA REPAIR N/A 11/01/2012   Procedure: HERNIA REPAIR PARASTOMAL;  Surgeon: Joyice Faster. Cornett, MD;  Location: [facility];  Service: General;  Laterality: N/A;   PARTIAL HYSTERECTOMY     partial   TANDEM RING INSERTION N/A 05/24/2019   Procedure: INSERTION OF HYMEN CAPSULE;  Surgeon: Gery Pray, MD;  Location: [facility];  Service: Gynecology;  Laterality: N/A;   TANDEM RING INSERTION N/A 06/14/2019   Procedure: HYMEN CAPSULE PLACEMENT;  Surgeon: Gery Pray, MD;   Location: [facility];  Service: Urology;  Laterality: N/A;    Family History: Family History  Problem Relation Age of Onset   Diabetes Mother    Throat cancer Mother    Uterine cancer Sister    Deep vein thrombosis Neg Hx    Pulmonary embolism Neg Hx     Social History: Social History   Socioeconomic History  Marital status: Married    Spouse name: Not on file   Number of children: Not on file   Years of education: Not on file   Highest education level: Not on file  Occupational History   Not on file  Tobacco Use   Smoking status: Former    Packs/day: 0.50    Types: Cigarettes    Quit date: 2010    Years since quitting: 13.8   Smokeless tobacco: Never   Tobacco comments:    about one pack a day on a bad day  Vaping Use   Vaping Use: Never used  Substance and Sexual Activity   Alcohol use: No   Drug use: No   Sexual activity: Never    Birth control/protection: None  Other Topics Concern   Not on file  Social History Narrative   Not on file   Social Determinants of Health   Financial Resource Strain: Not on file  Food Insecurity: No Food Insecurity (07/25/2022)   Hunger Vital Sign    Worried About Running Out of Food in the Last Year: Never true    [place] in the Last Year: Never true  Transportation Needs: No Transportation Needs (07/25/2022)   PRAPARE - Hydrologist (Medical): No    Lack of Transportation (Non-Medical): No  Physical Activity: Not on file  Stress: Not on file  Social Connections: Not on file    Allergies:  Allergies  Allergen Reactions   Tramadol     Per Dr. Sharyon Medicus is renal failure, patient states that she is currently taking this medication and she has not had a reaction to Tramadol since this time.    Objective:    Vital Signs:   Temp:  [97.8 F (36.6 C)-98.5 F (36.9 C)] 98.5 F (36.9 C) (11/06 0751) Pulse Rate:  [60-141] 85 (11/06 0751) Resp:  [19-24] 20 (11/06 0751) BP:  (94-121)/(50-96) 95/57 (11/06 0751) SpO2:  [81 %-98 %] 93 % (11/06 0832) Weight:  [97.1 kg] 97.1 kg (11/06 0612) Last BM Date : 07/24/22  Weight change: Filed Weights   07/24/22 0430 07/25/22 0535 07/26/22 0612  Weight: 102.1 kg 100.9 kg 97.1 kg    Intake/Output:   Intake/Output Summary (Last 24 hours) at 07/26/2022 1044 Last data filed at 07/26/2022 0751 Gross per 24 hour  Intake 834 ml  Output 4400 ml  Net -3566 ml      Physical Exam    General:  fatigued appearing, moderately obese. On 9 L [place]. No increased WOB  HEENT: normal ? Malar rash  Neck: supple. JVP 12 cm . Carotids 2+ bilat; no bruits. No lymphadenopathy or thyromegaly appreciated. Cor: PMI nondisplaced. Regular rate & rhythm. No rubs, gallops or murmurs. Lungs: b/l basilar inspiratory wheezing + rhonchi  Abdomen: obese, soft, nontender, nondistended. No hepatosplenomegaly. No bruits or masses. Good bowel sounds. Extremities: no cyanosis, clubbing, rash, trace b/l edema, b/l palmar erythema + spider hemangiomas on chest Neuro: alert & orientedx3, cranial nerves grossly intact. moves all 4 extremities w/o difficulty. Affect pleasant   Telemetry   NSR 80s   EKG    NSR 99 bpm w/ RAD on admit EKG 11/3  Repeat EKG 11/6 Afib w/ RVR 150s.    Labs   Basic Metabolic Panel: Recent Labs  Lab 07/23/22 0952 07/23/22 1423 07/24/22 0107 07/25/22 0048 07/26/22 0032  NA 140 141 143 140 139  K 4.2 4.4 4.3 3.8 3.4*  CL 103  --  104  103 101  CO2 18*  --  21* 21* 27  GLUCOSE 158*  --  156* 125* 131*  BUN 39*  --  49* 58* 45*  CREATININE 2.66*  --  2.17* 1.69* 1.46*  CALCIUM 7.8*  --  8.3* 8.3* 8.5*    Liver Function Tests: Recent Labs  Lab 07/23/22 0952 07/24/22 0107  AST 51* 38  ALT 12 9  ALKPHOS 157* 141*  BILITOT 1.1 1.9*  PROT 6.5 6.6  ALBUMIN 2.0* 2.4*   No results for input(s): "LIPASE", "AMYLASE" in the last 168 hours. Recent Labs  Lab 07/24/22 0059  AMMONIA 25    CBC: Recent Labs  Lab  07/23/22 0952 07/23/22 1423  WBC 13.0*  --   NEUTROABS 10.5*  --   HGB 11.0* 11.6*  HCT 36.0 34.0*  MCV 91.4  --   PLT 230  --     Cardiac Enzymes: No results for input(s): "CKTOTAL", "CKMB", "CKMBINDEX", "TROPONINI" in the last 168 hours.  BNP: BNP (last 3 results) Recent Labs    07/23/22 0952  BNP 905.0*    ProBNP (last 3 results) No results for input(s): "PROBNP" in the last 8760 hours.   CBG: Recent Labs  Lab 07/25/22 0600 07/25/22 1119 07/25/22 1605 07/25/22 2101 07/26/22 0607  GLUCAP 135* 124* 142* 147* 131*    Coagulation Studies: Recent Labs    07/23/22 1255 07/24/22 0107  LABPROT 16.9* 17.3*  INR 1.4* 1.4*     Imaging   DG Chest Port 1 View  Result Date: 07/26/2022 CLINICAL DATA:  Dyspnea. EXAM: PORTABLE CHEST 1 VIEW COMPARISON:  July 24, 2022. FINDINGS: Stable cardiomediastinal silhouette. Stable diffuse reticulonodular densities are noted throughout both lungs which may represent atypical inflammation or chronic interstitial lung disease. The visualized skeletal structures are unremarkable. IMPRESSION: Stable bilateral opacities as described above. Electronically Signed   By: Marijo Conception M.D.   On: 07/26/2022 10:40     Medications:     Current Medications:  ARIPiprazole  5 mg Oral Daily   atorvastatin  40 mg Oral QHS   budesonide (PULMICORT) nebulizer solution  0.25 mg Nebulization BID   Chlorhexidine Gluconate Cloth  6 each Topical Daily   clonazePAM  0.5 mg Oral BID   doxepin  100 mg Oral QHS   FLUoxetine  40 mg Oral Daily   furosemide  80 mg Intravenous Q12H   heparin  5,000 Units Subcutaneous Q12H   insulin aspart  0-9 Units Subcutaneous TID WC   ipratropium-albuterol  3 mL Nebulization TID   midodrine  5 mg Oral TID WC   nicotine  21 mg Transdermal Daily   pantoprazole  40 mg Oral Daily   sodium chloride flush  3 mL Intravenous Q12H    Infusions:  sodium chloride        Patient Profile   61 y/o female smoker w/  h/o COPD, HTN, HLD, Type 2DM and chronic pain syndrome/ fibromyalgia, admitted w/ new hypoxic respiratory failure, RV failure and AKI.   Assessment/Plan   1. Acute Hypoxic Respiratory Failure - remains on HFNC, 9L/min O2 sats 92%  - multifactorial  from CHF and COPDE - continue diuretics and CODP management per primary  - w/ concomitant RV failure and McConnell's sign on Echo, needs w/u to exclude PE. V/Q scan pending.   2. RV Failure/ Pulmonary HTN  - new, prior Echo 10/22 w/ normal LVEF and RV - Echo this admit LVEF 60-65%, RV severely reduced, RVSP 46 mmHg   -  Etiology uncertain  - needs V/Q scan to r/o acute PE/ CTEPH  - plan RHC today  - check PFTs - given exam findings, check CTD serologies  - outpatient sleep study to r/o OSA  - needs further diuresis. C/w IV Lasix - GDMT limited by AKI and hypotension requiring midodrine   3. AKI  - SCr 2.6 on admit, b/l < 1.0  - in setting of RV failure and hypotension - Scr improving w/ BP stabilization w/ midodrine and diuresis w/ lasix, down to 1.46 today  - avoid hypotension, titration of midodrine as needed  - follow closely   4. ? Cirrhosis  - in setting of RV failure. Denies ETOH use   - + palmar erythema and ? Spider hemangiomas on exam  - RUQ [place] limited due to patient habitus but suggestive of possible liver disease/cirrhosis (coarsened hepatic echotexture). - AST and ALT nl, but Tbili elevated at 1.9  - will discuss further w/u w/ IM   5. COPDE - per IM - smoking cessation encouraged  - check PFTs   6. Hypokalemia - K 3.4 w/ diuresis  - K Supp ordered - follow BMP   7. + HIV Screen  - further testing pending   8. Tobacco Use - heavy, long time smoker - cessation advised - Nicoderm patch ordered   9. Afib w/ RVR/ PAF  - new this admit, brief overnight, back in NSR  - needs anticoagulation, start heparin gtt   Length of Stay: [id number] PA-C  07/26/2022, 10:44 AM  Advanced Heart Failure  Team Pager [phone] (M-F; 7a - 5p)  Please contact [facility] for night-coverage after hours (4p -7a ) and weekends on [email or web address]  Patient seen with PA, agree with the above note.   Patient was admitted with AECOPD + RV failure.  Initially with AKI, creatinine now trending down to 1.46 today. She has a history of COPD, not on home oxygen.  HRCT in 4/23 showed emphysema and possible smoking-related interstitial fibrosis.  She is on 10 L [place] currently.   Atrial fibrillation with RVR noted last night.   Echo this admission with EF 60-65%, D-shaped septum, moderate RV enlargement with severely decreased RV systolic function, IVC dilated.    V/Q scan did not show evidence for PE.   RHC Procedural Findings (today): Hemodynamics (mmHg) RA mean 11 RV 45/15 PA 44/19, mean 28 PCWP mean 4 Oxygen saturations: PA 52% AO 91% Cardiac Output (Fick) 4.69  Cardiac Index (Fick) 2.28 PVR 5.1 WU Cardiac Output (thermo) 5.46 Cardiac Index (thermo) 2.65  PVR 4.4 WU PAPI 2.3   General: NAD Neck: JVP 12 cm, no thyromegaly or thyroid nodule.  Lungs: Clear to auscultation bilaterally with normal respiratory effort. CV: Nondisplaced PMI.  Heart regular S1/S2, no S3/S4, no murmur.  1+ ankle edema.  No carotid bruit.  Normal pedal pulses.  Abdomen: Soft, nontender, no hepatosplenomegaly, no distention.  Skin: Intact without lesions or rashes.  Neurologic: Alert and oriented x 3.  Psych: Normal affect. Extremities: No clubbing or cyanosis.  HEENT: Normal.   1. Acute hypoxemic respiratory failure: Now on 10 L oxygen.  Has COPD, but does not wear oxygen at home. She was smoking up to this admission.  Suspect combination of AECOPD and RV failure/cor pulmonale.  No evidence for PE by V/Q scan. RHC today with low PCWP and elevated right heart pressures, suggesting cor pulmonale.  - Currently on nebulized budesonide - Would give Lasix 80 mg IV x 1-2  more doses then to po.  2. RV failure/cor pulmonale:  Echo showed EF 60-65%, D-shaped septum, moderate RV enlargement with severely decreased RV systolic function, IVC dilated. RHC today with low PCWP, elevated right-sided filling pressures, mild PAH, preserved cardiac output. Suspect RV failure due to parenchymal lung disease (emphysema, smoking-related ILD noted on HRCT 4/23).   - Will give Lasix 80 mg IV x 1-2 more doses then to po.  - BP has been running low, she is on low dose midodrine.  3. 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  HRCT 4/23 showed emphysema and smoking-related ILD.  V/Q scan did not show evidence for PE.  - Treatment will be diuresis, oxygen.  4. COPD: Emphysema on chest imaging.  Active smoker at admission.  - Needs to quit smoking.  5. Atrial fibrillation: Paroxysmal.  AF with RVR noted overnight but now back in NSR.  - Start Eliquis.  6. AKI: Creatinine 2.6 at admission, down to 1.46 today. Follow closely, suspect cardiorenal syndrome.  7. Cirrhosis: Possible cirrhosis by RUQ [place].  This may be due to RV failure.  8. 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   Loralie Champagne 07/26/2022 5:31 PM

## 2022-07-26 NOTE — Progress Notes (Addendum)
Progress Note   Patient: [name] LGX:[id number] DOB: September 24, 1960 DOA: 07/23/2022     3 DOS: the patient was seen and examined on 07/26/2022   Brief hospital course: Mrs. Tankard was admitted to the hospital with the working diagnosis of heart failure/ COPD decompensation.   61 yo female with the past medical history of COPD, hypertension, dyslipidemia, fibromyalgia and T2DM who presented with dyspnea and lower extremity edema. Reported 3 weeks of dyspnea and cough, her symptoms progressed to lower extremity edema. The night prior to admission she had orthopnea and severe dyspnea that prompted her to come to the ED. On her initial physical examination her blood pressure was 107/47, HR 93, RR 26 and 02 saturation 97%, lungs with scattered wheezing and rales bilaterally, increased work of breathing, heart with S1 and S2 present and rhythmic with no gallops or murmurs, abdomen with no distention and positive lower extremity edema ++.   Na 144, K 4,2 CL 103 bicarbonate 18 glucose 158 bun 39 and cr 2,6  BNP 905  High sensitive troponin 143 and 147  Wbc 13,0 hgb 11,0 plt 230  Sars covid 19 negative  INR 1,4  HIV screen positive   Chest radiograph with mild cardiomegaly, with bilateral hilar vascular congestion and cephalization of the vasculature, fluid in the right fissure.   EKG 99 bpn, normal axis, qtc 511, sinus rhythm with poor R R wave progression, no significant ST segment changes, negative T V1 and V2, low voltage.   Patient was placed on non invasive mechanical ventilation.  Diuresis with furosemide and systemic corticosteroids.   11/04 added midodrine to allow better diuresis in the setting of hypotension.  11/05 patient with severe RV failure, continue diuresing.   Assessment and Plan: * Acute on chronic diastolic CHF (congestive heart failure) (HCC) Echocardiogram with preserved LV systolic function EF 60 to 65%, interventricular septum is flattened in systole and diastole,  consistent with RV pressure and volume overload, RV systolic function with severe reduction, moderate enlargement of RV cavity, RVSP 45.7, no significant valvular disease.   Acute on chronic core pulmonale. Pulmonary hypertension.  Troponin elevation due to heart failure exacerbation, no signs of acute coronary syndrome.   Urine output 5,350 ml over last 24 hrs  Systolic blood pressure is 105 mmHg.   Continue diuresis with furosemide 80 mg IV q12 hrs  Continue with midodrine for blood pressure support.  Patient with severe right heart failure, will consult heart failure team for further recommendations.   Acute hypoxemic respiratory failure due to cardiogenic pulmonary edema and pulmonary hypertension  Continue with high 02 requirements at 8 L to 10 L per high flow [place] with 02 saturation 93% Chest radiograph with persistent bilateral interstitial infiltrates.   Pending V/Q scan to complete work up for pulmonary hypertension.   Acute exacerbation of COPD with asthma ([place]) Patient with no wheezing, and no increased sputum production.  Plan to continue bronchodilator therapy Continue supplemental 02 per Carl to keel 02 saturation 92% or greater.  No current indication for systemic corticosteroids. (Currently exacerbation has resolved).   AKI (acute kidney injury) ([place]) Hypokalemia  Suspect some chronic component to her renal failure, her last serum cr on the system is from 2021.   Patient with improvement in volume status but not back to baseline.  Renal function with serum cr at 1,46 with K at 3,4 and serum bicarbonate at 27. Continue diuresis with furosemide.   Essential hypertension Continue blood pressure support with midodrine.  Acid reflux disease Continue acid suppressive therapy.   Type 2 diabetes mellitus with hyperlipidemia (HCC) Fasting glucose this am is 131 mg/dl.   Plan to continue insulin sliding scale for glucose cover and monitoring.    Fibromyalgia Depression,  Continue with aripiprazole, clonazepam   Class 2 obesity Calculated BMI is 36.3    Positive HIV screen will order antibody and RNA viral load.      Subjective: Patient with improved dyspnea and edema but not back to her baseline   Physical Exam: Vitals:   07/26/22 0625 07/26/22 0751 07/26/22 0832 07/26/22 1101  BP:  (!) 95/57  (!) 105/52  Pulse:  85  87  Resp:  20  19  Temp:  98.5 F (36.9 C)  98.2 F (36.8 C)  TempSrc:  Oral  Oral  SpO2: 90% 95% 93% 93%  Weight:      Height:       Neurology awake and alert ENT with mild pallor Cardiovascular with S1 and S2 present and rhythmic with no gallops, rubs or murmurs Positive JVD, moderate  Positive lower extremity edema ++ Respiratory with diffuse rales bilaterally with no wheezing Abdomen with no distention   Data Reviewed:    Family Communication: no family at the bedside   Disposition: Status is: Inpatient Remains inpatient appropriate because: heart failure   Planned Discharge Destination: Home      Author: Tawni Millers, MD 07/26/2022 11:16 AM  For on call review [email or web address].

## 2022-07-26 NOTE — H&P (View-Only) (Signed)
Progress Note   Patient: [name]:[id number] DOB: 04-10-1961 DOA: 07/23/2022     3 DOS: the patient was seen and examined on 07/26/2022   Brief hospital course: [name] was admitted to the hospital with the working diagnosis of heart failure/ COPD decompensation.   61 yo female with the past medical history of COPD, hypertension, dyslipidemia, fibromyalgia and T2DM who presented with dyspnea and lower extremity edema. Reported 3 weeks of dyspnea and cough, her symptoms progressed to lower extremity edema. The night prior to admission she had orthopnea and severe dyspnea that prompted her to come to the ED. On her initial physical examination her blood pressure was 107/47, HR 93, RR 26 and 02 saturation 97%, lungs with scattered wheezing and rales bilaterally, increased work of breathing, heart with S1 and S2 present and rhythmic with no gallops or murmurs, abdomen with no distention and positive lower extremity edema ++.   Na 144, K 4,2 CL 103 bicarbonate 18 glucose 158 bun 39 and cr 2,6  BNP 905  High sensitive troponin 143 and 147  Wbc 13,0 hgb 11,0 plt 230  Sars covid 19 negative  INR 1,4  HIV screen positive   Chest radiograph with mild cardiomegaly, with bilateral hilar vascular congestion and cephalization of the vasculature, fluid in the right fissure.   EKG 99 bpn, normal axis, qtc 511, sinus rhythm with poor R R wave progression, no significant ST segment changes, negative T V1 and V2, low voltage.   Patient was placed on non invasive mechanical ventilation.  Diuresis with furosemide and systemic corticosteroids.   11/04 added midodrine to allow better diuresis in the setting of hypotension.  11/05 patient with severe RV failure, continue diuresing.   Assessment and Plan: * Acute on chronic diastolic CHF (congestive heart failure) (HCC) Echocardiogram with preserved LV systolic function EF 60 to 65%, interventricular septum is flattened in systole and diastole,  consistent with RV pressure and volume overload, RV systolic function with severe reduction, moderate enlargement of RV cavity, RVSP 45.7, no significant valvular disease.   Acute on chronic core pulmonale. Pulmonary hypertension.  Troponin elevation due to heart failure exacerbation, no signs of acute coronary syndrome.   Urine output 5,350 ml over last 24 hrs  Systolic blood pressure is 105 mmHg.   Continue diuresis with furosemide 80 mg IV q12 hrs  Continue with midodrine for blood pressure support.  Patient with severe right heart failure, will consult heart failure team for further recommendations.   Acute hypoxemic respiratory failure due to cardiogenic pulmonary edema and pulmonary hypertension  Continue with high 02 requirements at 8 L to 10 L per high flow [place] with 02 saturation 93% Chest radiograph with persistent bilateral interstitial infiltrates.   Pending V/Q scan to complete work up for pulmonary hypertension.   Acute exacerbation of COPD with asthma ([place]) Patient with no wheezing, and no increased sputum production.  Plan to continue bronchodilator therapy Continue supplemental 02 per [place] to keel 02 saturation 92% or greater.  No current indication for systemic corticosteroids. (Currently exacerbation has resolved).   AKI (acute kidney injury) ([place]) Hypokalemia  Suspect some chronic component to her renal failure, her last serum cr on the system is from 2021.   Patient with improvement in volume status but not back to baseline.  Renal function with serum cr at 1,46 with K at 3,4 and serum bicarbonate at 27. Continue diuresis with furosemide.   Essential hypertension Continue blood pressure support with midodrine.  Acid reflux disease Continue acid suppressive therapy.   Type 2 diabetes mellitus with hyperlipidemia (HCC) Fasting glucose this am is 131 mg/dl.   Plan to continue insulin sliding scale for glucose cover and monitoring.    Fibromyalgia Depression,  Continue with aripiprazole, clonazepam   Class 2 obesity Calculated BMI is 36.3    Positive HIV screen will order antibody and RNA viral load.      Subjective: Patient with improved dyspnea and edema but not back to her baseline   Physical Exam: Vitals:   07/26/22 0625 07/26/22 0751 07/26/22 0832 07/26/22 1101  BP:  (!) 95/57  (!) 105/52  Pulse:  85  87  Resp:  20  19  Temp:  98.5 F (36.9 C)  98.2 F (36.8 C)  TempSrc:  Oral  Oral  SpO2: 90% 95% 93% 93%  Weight:      Height:       Neurology awake and alert ENT with mild pallor Cardiovascular with S1 and S2 present and rhythmic with no gallops, rubs or murmurs Positive JVD, moderate  Positive lower extremity edema ++ Respiratory with diffuse rales bilaterally with no wheezing Abdomen with no distention   Data Reviewed:    Family Communication: no family at the bedside   Disposition: Status is: Inpatient Remains inpatient appropriate because: heart failure   Planned Discharge Destination: Home      Author: Tawni Millers, MD 07/26/2022 11:16 AM  For on call review [email or web address].

## 2022-07-26 NOTE — Progress Notes (Signed)
BIPAP on stand by. No distress noted at this time. 

## 2022-07-26 NOTE — Progress Notes (Addendum)
    ANTICOAGULATION CONSULT NOTE - Initial Consult   PM: VQ negative for PE, post cath stop heparin drip > begin apixaban '5mg'$  BID for new Afib  Bonnita Nasuti Pharm.D. CPP, BCPS Clinical Pharmacist [phone] 07/26/2022 5:25 PM   Pharmacy Consult for heparin Indication: atrial fibrillation / R/O PE - VQ pending  Allergies  Allergen Reactions   Tramadol     Per Dr. Sharyon Medicus is renal failure, patient states that she is currently taking this medication and she has not had a reaction to Tramadol since this time.    Patient Measurements: Height: '5\' 6"'$  (167.6 cm) Weight: 97.1 kg (214 lb 1.6 oz) IBW/kg (Calculated) : 59.3   Vital Signs: Temp: 98.2 F (36.8 C) (11/06 1101) Temp Source: Oral (11/06 1101) BP: 105/52 (11/06 1101) Pulse Rate: 87 (11/06 1101)  Labs: Recent Labs    07/23/22 1631 07/24/22 0107 07/25/22 0048 07/26/22 0032  LABPROT  --  17.3*  --   --   INR  --  1.4*  --   --   CREATININE  --  2.17* 1.69* 1.46*  TROPONINIHS 135*  --   --   --     Estimated Creatinine Clearance: 46.3 mL/min (A) (by C-G formula based on SCr of 1.46 mg/dL (H)).   Medical History: Past Medical History:  Diagnosis Date   Acid reflux disease    Anxiety    Asthma    Bronchitis, chronic (HCC)    Effects worse with URI   Chronic pain syndrome    COPD (chronic obstructive pulmonary disease) (HCC)    Depression    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, type 2   Diverticulitis    Dysplasia of cervix (uteri)    patient states she had dysplasia of the uterus stage 4   Endometrial cancer (HCC)    Fibromyalgia    Fibromyalgia    GERD (gastroesophageal reflux disease)    Headache(784.0)    migraine headache   Hyperlipidemia    Hypertension    Kidney stones    at least 6 stones in past   Pneumonia 2019   Restless leg syndrome    Bilateral   Vomiting    last night at 2300 after last round of antibiotic     Assessment: 61yof admitted with  SOB/LE edema being treated for HF exacerbation with IV furosemide net-7L.  New RV dysfunction > concern for PE VQ scan ordered, also with run new Afib last pm now SR> will start IV heparin with planned RHC tomorrow and begin oral anticoagulation when procedures complete.    Goal of Therapy:  Heparin level 0.3-0.7 units/ml Monitor platelets by anticoagulation protocol: Yes   Plan:  Stop sq heparin  Heparin bolus 2000 uts IVx1  Heparin drip 1000 uts/hr  Heparin level 6hr after start Daily CBC and heparin level Monitor s/s bleeding    Bonnita Nasuti Pharm.D. CPP, BCPS Clinical Pharmacist [phone] 07/26/2022 1:44 PM

## 2022-07-26 NOTE — Progress Notes (Addendum)
   07/26/22 0005  Vitals  Temp 98.2 F (36.8 C)  Temp Source Oral  BP (!) 106/56  MAP (mmHg) 72  BP Location Right Arm  BP Method Automatic  Patient Position (if appropriate) Lying  Pulse Rate (!) 141  Pulse Rate Source Monitor  ECG Heart Rate (!) 154  Resp (!) 22  MEWS COLOR  MEWS Score Color Red  Oxygen Therapy  SpO2 97 %  O2 Device Bi-PAP  Pain Assessment  Pain Scale 0-10  Pain Score 0  MEWS Score  MEWS Temp 0  MEWS Systolic 0  MEWS Pulse 3  MEWS RR 1  MEWS LOC 0  MEWS Score 4  Provider Notification  Provider Name/Title Hall MD  Date Provider Notified 07/26/22  Time Provider Notified 0003  Method of Notification Page  Notification Reason Change in status (Patient converted to afib RVR)  Provider response See new orders  Date of Provider Response 07/26/22  Time of Provider Response 0011     Patient converted afib however does not stay in afib, goes back into NSR with Hrs 70-90s

## 2022-07-26 NOTE — Interval H&P Note (Signed)
History and Physical Interval Note:  07/26/2022 4:22 PM  [name]  has presented today for surgery, with the diagnosis of heart failure.  The various methods of treatment have been discussed with the patient and family. After consideration of risks, benefits and other options for treatment, the patient has consented to  Procedure(s): RIGHT HEART CATH (N/A) as a surgical intervention.  The patient's history has been reviewed, patient examined, no change in status, stable for surgery.  I have reviewed the patient's chart and labs.  Questions were answered to the patient's satisfaction.     [name] [employer]

## 2022-07-27 ENCOUNTER — Telehealth (HOSPITAL_COMMUNITY): Payer: Self-pay | Admitting: Pharmacy Technician

## 2022-07-27 ENCOUNTER — Encounter (HOSPITAL_COMMUNITY): Payer: Self-pay | Admitting: Cardiology

## 2022-07-27 ENCOUNTER — Other Ambulatory Visit (HOSPITAL_COMMUNITY): Payer: Self-pay

## 2022-07-27 ENCOUNTER — Inpatient Hospital Stay (HOSPITAL_COMMUNITY): Payer: HMO

## 2022-07-27 DIAGNOSIS — I5033 Acute on chronic diastolic (congestive) heart failure: Secondary | ICD-10-CM | POA: Diagnosis not present

## 2022-07-27 DIAGNOSIS — I480 Paroxysmal atrial fibrillation: Secondary | ICD-10-CM

## 2022-07-27 DIAGNOSIS — N179 Acute kidney failure, unspecified: Secondary | ICD-10-CM | POA: Diagnosis not present

## 2022-07-27 DIAGNOSIS — I10 Essential (primary) hypertension: Secondary | ICD-10-CM | POA: Diagnosis not present

## 2022-07-27 DIAGNOSIS — J441 Chronic obstructive pulmonary disease with (acute) exacerbation: Secondary | ICD-10-CM | POA: Diagnosis not present

## 2022-07-27 LAB — HIV-1/HIV-2 QUALITATIVE RNA
Final Interpretation: NEGATIVE
HIV-1 RNA, Qualitative: NONREACTIVE
HIV-2 RNA, Qualitative: NONREACTIVE

## 2022-07-27 LAB — CBC
HCT: 35 % — ABNORMAL LOW (ref 36.0–46.0)
Hemoglobin: 10.7 g/dL — ABNORMAL LOW (ref 12.0–15.0)
MCH: 27.3 pg (ref 26.0–34.0)
MCHC: 30.6 g/dL (ref 30.0–36.0)
MCV: 89.3 fL (ref 80.0–100.0)
Platelets: 273 10*3/uL (ref 150–400)
RBC: 3.92 MIL/uL (ref 3.87–5.11)
RDW: 19.2 % — ABNORMAL HIGH (ref 11.5–15.5)
WBC: 5.9 10*3/uL (ref 4.0–10.5)
nRBC: 0.9 % — ABNORMAL HIGH (ref 0.0–0.2)

## 2022-07-27 LAB — GLUCOSE, CAPILLARY
Glucose-Capillary: 129 mg/dL — ABNORMAL HIGH (ref 70–99)
Glucose-Capillary: 154 mg/dL — ABNORMAL HIGH (ref 70–99)
Glucose-Capillary: 129 mg/dL — ABNORMAL HIGH (ref 70–99)
Glucose-Capillary: 179 mg/dL — ABNORMAL HIGH (ref 70–99)

## 2022-07-27 LAB — RHEUMATOID FACTOR: Rheumatoid fact SerPl-aCnc: 24.3 IU/mL — ABNORMAL HIGH (ref <14.0)

## 2022-07-27 LAB — BASIC METABOLIC PANEL
Anion gap: 15 (ref 5–15)
BUN: 36 mg/dL — ABNORMAL HIGH (ref 8–23)
CO2: 26 mmol/L (ref 22–32)
Calcium: 8.9 mg/dL (ref 8.9–10.3)
Chloride: 98 mmol/L (ref 98–111)
Creatinine, Ser: 1.16 mg/dL — ABNORMAL HIGH (ref 0.44–1.00)
GFR, Estimated: 53 mL/min — ABNORMAL LOW (ref >60)
Glucose, Bld: 117 mg/dL — ABNORMAL HIGH (ref 70–99)
Potassium: 4.2 mmol/L (ref 3.5–5.1)
Sodium: 139 mmol/L (ref 135–145)

## 2022-07-27 LAB — ANA W/REFLEX IF POSITIVE: Anti Nuclear Antibody (ANA): NEGATIVE

## 2022-07-27 LAB — ANTI-SCLERODERMA ANTIBODY: Scleroderma (Scl-70) (ENA) Antibody, IgG: <0.2 AI (ref 0.0–0.9)

## 2022-07-27 LAB — MAGNESIUM: Magnesium: 1.8 mg/dL (ref 1.7–2.4)

## 2022-07-27 LAB — HIV-1/2 AB - DIFFERENTIATION
HIV 1 Ab: NONREACTIVE
HIV 2 Ab: NONREACTIVE
Note: NEGATIVE

## 2022-07-27 LAB — HIV-1 RNA QUANT-NO REFLEX-BLD
HIV 1 RNA Quant: <20 copies/mL
LOG10 HIV-1 RNA: UNDETERMINED log10copy/mL

## 2022-07-27 LAB — ANTI-DNA ANTIBODY, DOUBLE-STRANDED: ds DNA Ab: <1 IU/mL (ref 0–9)

## 2022-07-27 MED ORDER — OXYCODONE HCL 5 MG PO TABS
5.0000 mg | ORAL_TABLET | ORAL | Status: AC
  Administered 2022-07-27: 5 mg via ORAL
  Filled 2022-07-27: qty 1

## 2022-07-27 MED ORDER — MAGNESIUM SULFATE 2 GM/50ML IV SOLN
2.0000 g | Freq: Once | INTRAVENOUS | Status: AC
  Administered 2022-07-27: 2 g via INTRAVENOUS
  Filled 2022-07-27: qty 50

## 2022-07-27 MED ORDER — SPIRONOLACTONE 12.5 MG HALF TABLET
12.5000 mg | ORAL_TABLET | Freq: Every day | ORAL | Status: DC
  Administered 2022-07-27 – 2022-08-04 (×9): 12.5 mg via ORAL
  Filled 2022-07-27 (×9): qty 1

## 2022-07-27 MED ORDER — MIDODRINE HCL 5 MG PO TABS
10.0000 mg | ORAL_TABLET | Freq: Three times a day (TID) | ORAL | Status: DC
  Administered 2022-07-27 – 2022-07-28 (×3): 10 mg via ORAL
  Filled 2022-07-27 (×3): qty 2

## 2022-07-27 MED ORDER — FUROSEMIDE 10 MG/ML IJ SOLN
80.0000 mg | Freq: Once | INTRAMUSCULAR | Status: AC
  Administered 2022-07-27: 80 mg via INTRAVENOUS
  Filled 2022-07-27: qty 8

## 2022-07-27 NOTE — Telephone Encounter (Signed)
Pharmacy Patient Advocate Encounter  Insurance verification completed.    The patient is insured through [employer] Medicare Part D   The patient is currently admitted and ran test claims for the following: Eliquis.  Copays and coinsurance results were relayed to Inpatient clinical team.      

## 2022-07-27 NOTE — TOC Initial Note (Addendum)
Transition of Care [facility]) - Initial/Assessment Note    Patient Details  Name: [name] MRN: [id number] Date of Birth: [birth year]  [facility] CM/SW Contact:    Erenest Rasher, RN Phone Number: [phone] 07/27/2022, 3:21 PM  Clinical Narrative:                  HF TOC CM spoke to pt at bedside. Pt may need rollator, oxygen, nebulizer machine or Bipap for home. Pt states she was independent prior to hospital stay. She drives to her appts. She has a scale. Educated pt on the importance of daily weights.   Will order needed DME closer to dc.     Expected Discharge Plan: Home/Self Care Barriers to Discharge: Continued Medical Work up   Patient Goals and CMS Choice Patient states their goals for this hospitalization and ongoing recovery are:: wants to get back to her baseline      Expected Discharge Plan and Services Expected Discharge Plan: Home/Self Care   Discharge Planning Services: CM Consult   Living arrangements for the past 2 months: Single Family Home                                      Prior Living Arrangements/Services Living arrangements for the past 2 months: Single Family Home Lives with:: Parents Patient language and need for interpreter reviewed:: Yes Do you feel safe going back to the place where you live?: Yes      Need for Family Participation in Patient Care: No (Comment) Care giver support system in place?: Yes (comment)   Criminal Activity/Legal Involvement Pertinent to Current Situation/Hospitalization: No - Comment as needed  Activities of Daily Living Home Assistive Devices/Equipment: None ADL Screening (condition at time of admission) Patient's cognitive ability adequate to safely complete daily activities?: Yes Is the patient deaf or have difficulty hearing?: No Does the patient have difficulty seeing, even when wearing glasses/contacts?: No Does the patient have difficulty concentrating, remembering, or  making decisions?: No Patient able to express need for assistance with ADLs?: Yes Does the patient have difficulty dressing or bathing?: No Independently performs ADLs?: Yes (appropriate for developmental age) Does the patient have difficulty walking or climbing stairs?: No Weakness of Legs: Both Weakness of Arms/Hands: None  Permission Sought/Granted Permission sought to share information with : Case Manager, Family Supports, PCP Permission granted to share information with : Yes, Verbal Permission Granted  Share Information with NAME: Marjo Bicker     Permission granted to share info w Relationship: mother  Permission granted to share info w Contact Information: [phone]  Emotional Assessment Appearance:: Appears stated age Attitude/Demeanor/Rapport: Engaged Affect (typically observed): Accepting Orientation: : Oriented to Self, Oriented to Place, Oriented to  Time, Oriented to Situation   Psych Involvement: No (comment)  Admission diagnosis:  CHF (congestive heart failure) (HCC) [I50.9] Peripheral edema [R60.9] Hypoxia [R09.02] AKI (acute kidney injury) ([place]) [N17.9] Patient Active Problem List   Diagnosis Date Noted   CHF (congestive heart failure), NYHA class III, acute, systolic ([place]) 91/69/4503   Acute on chronic diastolic CHF (congestive heart failure) ([place]) 07/23/2022   Type 2 diabetes mellitus with hyperlipidemia ([place]) 01/09/2019   Essential hypertension 12/12/2018   Hyperlipidemia 12/12/2018   Preoperative clearance 12/12/2018   Endometrial cancer ([place]) 11/15/2018   Incisional hernia, without obstruction or gangrene 02/04/2014   Post-op pain 07/23/2013   Postoperative wound infection  05/25/2013   Wound disruption, post-op, skin 01/30/2013   Preoperative respiratory examination 12/18/2012   Smoking 12/18/2012   Heme positive stool 11/25/2012   Hypokalemia 11/25/2012   COPD (chronic obstructive pulmonary disease) ([place])    Acid reflux disease    Post-operative  state 11/21/2012   AKI (acute kidney injury) ([place]) 11/04/2012   HAP (hospital-acquired pneumonia) 11/03/2012   Acute pulmonary edema ([place]) 11/03/2012   Acute respiratory failure with hypoxia ([place]) 11/02/2012   Acute exacerbation of COPD with asthma ([place]) 11/02/2012   Parastomal hernia 11/02/2012   Fibromyalgia 11/02/2012   Generalized anxiety disorder 11/02/2012   Restless legs syndrome 11/02/2012   Class 2 obesity 11/02/2012   Leukocytosis, unspecified 11/02/2012   Hyperglycemia 11/02/2012   Cigarette nicotine dependence with withdrawal 11/02/2012   Complete atelectasis 11/02/2012   PCP:  Ginger Organ., MD Pharmacy:   [id number] [place]Phone: [phone]: [phone] [facility] [address] [place] [place]Phone: [phone]: [phone]    Social Determinants of Health (SDOH) Interventions    Readmission Risk Interventions     No data to display

## 2022-07-27 NOTE — Progress Notes (Signed)
Physical Therapy Treatment Patient Details Name: [name] MRN: [id number] DOB: 06-Feb-1961 Today's Date: 07/27/2022   History of Present Illness Pt is a 61 y.o. F who presents 07/23/2022 with dyspnea and LE edema. Working diagnosis of heart failure/COPD decompensation. Significant PMH: COPD, HTN, dyslipidemia, fibromyalgia, DM2.    PT Comments    Pt seen for PT tx with pt agreeable. Pt is able to complete bed mobility with mod I with HOB elevated & bed rails PRN. Pt tolerates sitting EOB ~20 minutes with good sitting balance. Pt engages in marching in place <30 seconds before requiring seated rest break. Pt progresses to ambulating in room without AD with CGA<>close supervision but pt reaching for objects for support 2/2 feeling "unsteady". Pt requires prolonged seated rest break between each activity to allow SpO2 to recover. Reviewed use of incentive spirometer, benefits of sitting up/OOB vs lying in bed, & encouraged pt to sit in chair for meals & pt voiced understanding.  Pt on 10L/min via HFNC throughout session. SpO2 drops to 85% upon sitting EOB, down to 81% after marching in place, and 77% after ambulating in room. Pt requires significantly extra time & seated rest break to recover. PT provides cuing for pursed lip breathing throughout.  Educated pt on possibility of trialing rollator during next session to provide her with BUE support for increased stability & place to sit & rest to recover SpO2 PRN.     Recommendations for follow up therapy are one component of a multi-disciplinary discharge planning process, led by the attending physician.  Recommendations may be updated based on patient status, additional functional criteria and insurance authorization.  Follow Up Recommendations  No PT follow up (pending progress)     Assistance Recommended at Discharge PRN  Patient can return home with the following A little help with walking and/or transfers;Assistance with  cooking/housework;Assist for transportation;Help with stairs or ramp for entrance   Equipment Recommendations  Rollator (4 wheels)    Recommendations for Other Services       Precautions / Restrictions Precautions Precautions: Fall;Other (comment) Precaution Comments: watch O2 Restrictions Weight Bearing Restrictions: No     Mobility  Bed Mobility Overal bed mobility: Modified Independent             General bed mobility comments: supine<>sit with HOB elevated, bed rails PRN    Transfers Overall transfer level: Needs assistance Equipment used: None Transfers: Sit to/from Stand Sit to Stand: Supervision                Ambulation/Gait Ambulation/Gait assistance: Min guard Gait Distance (Feet): 15 Feet Assistive device: None Gait Pattern/deviations: Decreased step length - right, Decreased step length - left, Decreased stride length, Decreased dorsiflexion - right, Decreased dorsiflexion - left Gait velocity: decreased     General Gait Details: Pt intermittently reaching for objects in room for BUE support 2/2 feeling "unsteady".   Stairs             Wheelchair Mobility    Modified Rankin (Stroke Patients Only)       Balance Overall balance assessment: Needs assistance Sitting-balance support: Bilateral upper extremity supported, Feet supported Sitting balance-Leahy Scale: Good     Standing balance support: No upper extremity supported, During functional activity Standing balance-Leahy Scale: Fair                              Cognition Arousal/Alertness: Awake/alert Behavior During Therapy: WFL for tasks assessed/performed  Overall Cognitive Status: Within Functional Limits for tasks assessed                                          Exercises Other Exercises Other Exercises: Reviewed use of incentive spirometer with pt return demonstrating.    General Comments        Pertinent Vitals/Pain Pain  Assessment Pain Assessment: No/denies pain    Home Living                          Prior Function            PT Goals (current goals can now be found in the care plan section) Acute Rehab PT Goals Patient Stated Goal: did not state PT Goal Formulation: With patient Time For Goal Achievement: 08/08/22 Potential to Achieve Goals: Good Progress towards PT goals: Progressing toward goals    Frequency    Min 3X/week      PT Plan Current plan remains appropriate    Co-evaluation              AM-PAC PT "6 Clicks" Mobility   Outcome Measure  Help needed turning from your back to your side while in a flat bed without using bedrails?: None Help needed moving from lying on your back to sitting on the side of a flat bed without using bedrails?: None Help needed moving to and from a bed to a chair (including a wheelchair)?: A Little Help needed standing up from a chair using your arms (e.g., wheelchair or bedside chair)?: A Little Help needed to walk in hospital room?: A Little Help needed climbing 3-5 steps with a railing? : A Lot 6 Click Score: 19    End of Session Equipment Utilized During Treatment: Oxygen Activity Tolerance: Patient tolerated treatment well Patient left: with call bell/phone within reach;with nursing/sitter in room (sitting EOB) Nurse Communication: Mobility status PT Visit Diagnosis: Unsteadiness on feet (R26.81);Difficulty in walking, not elsewhere classified (R26.2)     Time: 1447-1510 PT Time Calculation (min) (ACUTE ONLY): 23 min  Charges:  $Therapeutic Activity: 23-37 mins                     Lavone Nian, PT, DPT 07/27/22, 3:53 PM  Waunita Schooner 07/27/2022, 3:50 PM

## 2022-07-27 NOTE — Progress Notes (Signed)
2123 Pt complaining of chest pain 9/10. BP 93/47, HR 88,RR 20, O2 87. EKG completed. Pain medication administered. C. Hall,DO notified.  2125 Orders given.

## 2022-07-27 NOTE — Progress Notes (Addendum)
Progress Note   Patient: [name] ERX:[id number] DOB: 28-Feb-1961 DOA: 07/23/2022     4 DOS: the patient was seen and examined on 07/27/2022   Brief hospital course: [name] was admitted to the hospital with the working diagnosis of heart failure/ COPD decompensation.   61 yo female with the past medical history of COPD, hypertension, dyslipidemia, fibromyalgia and T2DM who presented with dyspnea and lower extremity edema. Reported 3 weeks of dyspnea and cough, her symptoms progressed to lower extremity edema. The night prior to admission she had orthopnea and severe dyspnea that prompted her to come to the ED. On her initial physical examination her blood pressure was 107/47, HR 93, RR 26 and 02 saturation 97%, lungs with scattered wheezing and rales bilaterally, increased work of breathing, heart with S1 and S2 present and rhythmic with no gallops or murmurs, abdomen with no distention and positive lower extremity edema ++.   Na 144, K 4,2 CL 103 bicarbonate 18 glucose 158 bun 39 and cr 2,6  BNP 905  High sensitive troponin 143 and 147  Wbc 13,0 hgb 11,0 plt 230  Sars covid 19 negative  INR 1,4  HIV screen positive   Chest radiograph with mild cardiomegaly, with bilateral hilar vascular congestion and cephalization of the vasculature, fluid in the right fissure.   EKG 99 bpn, normal axis, qtc 511, sinus rhythm with poor R R wave progression, no significant ST segment changes, negative T V1 and V2, low voltage.   Patient was placed on non invasive mechanical ventilation.  Diuresis with furosemide and systemic corticosteroids.   11/04 added midodrine to allow better diuresis in the setting of hypotension.  11/05 patient with severe RV failure, continue diuresing.  11/06 cardiac catheterization with improved filling pressures 11/07 high resolution CT to work up ILD.   Assessment and Plan: * Acute on chronic diastolic CHF (congestive heart failure) (HCC) Echocardiogram with  preserved LV systolic function EF 60 to 65%, interventricular septum is flattened in systole and diastole, consistent with RV pressure and volume overload, RV systolic function with severe reduction, moderate enlargement of RV cavity, RVSP 45.7, no significant valvular disease.   Acute on chronic core pulmonale. Pulmonary hypertension.  Troponin elevation due to heart failure exacerbation, no signs of acute coronary syndrome.   Urine output 2,000 ml over last 24 hrs  Systolic blood pressure is 102 to 107 mmHg.   11/06 Cardiac catheterization  RV 45/15  PA 44/15 mean 28 PCWP 4  Cardiac output 4,69 and index 2,28 (fick).  PVR 5.1   Precapillary pulmonary hypertension.   On midodrine for blood pressure support.   Acute hypoxemic respiratory failure due to cardiogenic pulmonary edema and pulmonary hypertension.   Continue with high 02 requirements at 12L per high flow [place] with 02 saturation 92%  V/Q scan low probability for pulmonary hypertension. Follow up with high resolution CT for further workup.   Acute exacerbation of COPD with asthma ([place]) Patient with no wheezing, and no increased sputum production.  Plan to continue bronchodilator therapy Continue supplemental 02 per [place] to keel 02 saturation 92% or greater.  No current indication for systemic corticosteroids. (Currently exacerbation has resolved).   AKI (acute kidney injury) ([place]) Hypokalemia, hypoMag Suspect some chronic component to her renal failure, her last serum cr on the system is from 2021.   Volume status has improved, renal function with serum cr at 1,16 with K at 4,2 and serum bicarbonate at 26. Mg 1.8  Add Mag sulfate  2 g   Follow up renal function in am.  Continue with spironolactone.   Essential hypertension Continue blood pressure support with midodrine.   Acid reflux disease Continue acid suppressive therapy.   Type 2 diabetes mellitus with hyperlipidemia (HCC) Continue insulin sliding scale for  glucose cover and monitoring.   Fibromyalgia Depression,  Continue with aripiprazole, clonazepam   Class 2 obesity Calculated BMI is 36.3   Paroxysmal atrial fibrillation (HCC) Patient is back on sinus rhythm, continue anticoagulation with apixaban. Continue telemetry monitoring   Positive HIV screen but negative RNA       Subjective: Patient with improvement in edema, but continue with dyspnea. No chest pain   Physical Exam: Vitals:   07/27/22 1759 07/27/22 1800 07/27/22 1939 07/27/22 1940  BP: (!) 102/56 (!) 102/56    Pulse: 82 87 82   Resp:   20   Temp:      TempSrc:      SpO2: 90% (!) 89% 92% (!) 88%  Weight:      Height:       Neurology awake and alert ENT with mild pallor Cardiovascular with S1 and S2 present and rhythmic with no gallops or murmurs Positive mild JVD Trace lower extremity edema Respiratory with rales bilaterally with no wheezing or rhonchi Abdomen with no distention  Data Reviewed:    Family Communication: no family at the bedside   Disposition: Status is: Inpatient Remains inpatient appropriate because: respiratory failure   Planned Discharge Destination: Home      Author: Tawni Millers, MD 07/27/2022 7:48 PM  For on call review [email or web address].

## 2022-07-27 NOTE — Consult Note (Signed)
   [facility] CM Inpatient Consult   07/27/2022  AINSLEY DEAKINS [birth year] [id number]  [facility] [ACO] Patient: [employer]  Primary Care Provider: Ginger Organ., MD, [facility]  Follow up:  Met with patient up in recliner chair.  Patient states feeling some better today.  She endorses her PCP and assess for high risk score for unplanned readmission prevention needs. Explained will follow for needs and that MD office will follow for for transition of care support.  She verbalized understanding.  Patient given an appointment follow up reminder card with a 24 hour nurse advise line number.  Plan: No current needs assessed at this time.  Natividad Brood, RN BSN [place]  [phone] business mobile phone Toll free office [phone]  *[place]  [phone] Fax number: [phone] [email or web address]

## 2022-07-27 NOTE — TOC Benefit Eligibility Note (Signed)
Patient Teacher, English as a foreign language completed.    The patient is currently admitted and upon discharge could be taking Eliquis 5 mg.  The current 30 day co-pay is $45.00.   The patient is insured through [place], [place] Patient Advocate Specialist [place] Patient Advocate Team Direct Number: [phone]  Fax: [phone]

## 2022-07-27 NOTE — Progress Notes (Addendum)
Advanced Heart Failure Rounding Note  PCP-Cardiologist: None   Subjective:    11/6 RHC with low PCWP, mildly elevated R sided filling pressures and mild PAH. Diuresed with IV lasix.   Brisk diuresis noted. Weight down 2 pounds? But was a bed weight.  Remains SOB with exertion.   RHC Procedural Findings: Hemodynamics (mmHg) RA mean 11 RV 45/15 PA 44/19, mean 28 PCWP mean 4 Oxygen saturations: PA 52% AO 91% Cardiac Output (Fick) 4.69  Cardiac Index (Fick) 2.28 PVR 5.1 WU Cardiac Output (thermo) 5.46 Cardiac Index (thermo) 2.65  PVR 4.4 WU PAPI 2.3   Objective:   Weight Range: 96.2 kg Body mass index is 34.23 kg/m.   Vital Signs:   Temp:  [98 F (36.7 C)-98.9 F (37.2 C)] 98 F (36.7 C) (11/07 0400) Pulse Rate:  [74-91] 74 (11/07 0400) Resp:  [18-32] 21 (11/07 0400) BP: (95-119)/(47-76) 113/76 (11/07 0400) SpO2:  [88 %-96 %] 96 % (11/07 0400) FiO2 (%):  [45 %-50 %] 45 % (11/06 2330) Weight:  [96.2 kg-97.1 kg] 96.2 kg (11/07 0400) Last BM Date : 07/24/22  Weight change: Filed Weights   07/26/22 0612 07/26/22 1251 07/27/22 0400  Weight: 97.1 kg 97.1 kg 96.2 kg    Intake/Output:   Intake/Output Summary (Last 24 hours) at 07/27/2022 0735 Last data filed at 07/27/2022 0700 Gross per 24 hour  Intake 743.8 ml  Output 5250 ml  Net -4506.2 ml      Physical Exam    General:  No resp difficulty HEENT: Normal Neck: Supple. JVP 5-6 . Carotids 2+ bilat; no bruits. No lymphadenopathy or thyromegaly appreciated. Cor: PMI nondisplaced. Regular rate & rhythm. No rubs, gallops or murmurs. Lungs: Clear on 10 liters [place] Abdomen: Soft, nontender, nondistended. No hepatosplenomegaly. No bruits or masses. Good bowel sounds. Extremities: No cyanosis, clubbing, rash, edema Neuro: Alert & orientedx3, cranial nerves grossly intact. moves all 4 extremities w/o difficulty. Affect pleasant   Telemetry  SR 70-80s personally checked.    EKG    N/A  Labs     CBC Recent Labs    07/26/22 1633 07/27/22 0059  WBC  --  5.9  HGB 12.2  11.9* 10.7*  HCT 36.0  35.0* 35.0*  MCV  --  89.3  PLT  --  253   Basic Metabolic Panel Recent Labs    07/26/22 0032 07/26/22 1633 07/27/22 0059  NA 139 140  141 139  K 3.4* 4.2  4.2 4.2  CL 101  --  98  CO2 27  --  26  GLUCOSE 131*  --  117*  BUN 45*  --  36*  CREATININE 1.46*  --  1.16*  CALCIUM 8.5*  --  8.9  MG  --   --  1.8   Liver Function Tests No results for input(s): "AST", "ALT", "ALKPHOS", "BILITOT", "PROT", "ALBUMIN" in the last 72 hours. No results for input(s): "LIPASE", "AMYLASE" in the last 72 hours. Cardiac Enzymes No results for input(s): "CKTOTAL", "CKMB", "CKMBINDEX", "TROPONINI" in the last 72 hours.  BNP: BNP (last 3 results) Recent Labs    07/23/22 0952  BNP 905.0*    ProBNP (last 3 results) No results for input(s): "PROBNP" in the last 8760 hours.   D-Dimer No results for input(s): "DDIMER" in the last 72 hours. Hemoglobin A1C No results for input(s): "HGBA1C" in the last 72 hours. Fasting Lipid Panel No results for input(s): "CHOL", "HDL", "LDLCALC", "TRIG", "CHOLHDL", "LDLDIRECT" in the last 72 hours. Thyroid Function  Tests No results for input(s): "TSH", "T4TOTAL", "T3FREE", "THYROIDAB" in the last 72 hours.  Invalid input(s): "FREET3"  Other results:   Imaging    CARDIAC CATHETERIZATION  Result Date: 07/26/2022 1. Low PCWP 2. Mildly elevated right-sided filling pressures. 3. Mild pulmonary arterial hypertesion, suspect group 3 with parenchymal lung disease. 4. PAPI is normal. Predominant RV failure.   NM Pulmonary Perfusion  Result Date: 07/26/2022 CLINICAL DATA:  Shortness of breath question pulmonary embolism EXAM: NUCLEAR MEDICINE PERFUSION LUNG SCAN TECHNIQUE: Perfusion images were obtained in multiple projections after intravenous injection of radiopharmaceutical. Ventilation scans intentionally deferred if perfusion scan and chest x-ray  adequate for interpretation during COVID 19 epidemic. RADIOPHARMACEUTICALS:  3.8 mCi Tc-62mMAA IV COMPARISON:  Chest radiograph 07/26/2022 FINDINGS: Minimal peripheral irregularity of perfusion in the upper lobes slightly greater on RIGHT. Overall pattern is nonsegmental. Findings favor parenchymal lung disease. No segmental or subsegmental perfusion defects to suggest pulmonary embolism. IMPRESSION: Perfusion lung scan changes consistent with parenchymal lung disease, corresponding to chronic interstitial lung disease changes identified on chest radiographs. Pulmonary embolism absent. Electronically Signed   By: MLavonia DanaM.D.   On: 07/26/2022 15:28   DG Chest Port 1 View  Result Date: 07/26/2022 CLINICAL DATA:  Dyspnea. EXAM: PORTABLE CHEST 1 VIEW COMPARISON:  July 24, 2022. FINDINGS: Stable cardiomediastinal silhouette. Stable diffuse reticulonodular densities are noted throughout both lungs which may represent atypical inflammation or chronic interstitial lung disease. The visualized skeletal structures are unremarkable. IMPRESSION: Stable bilateral opacities as described above. Electronically Signed   By: JMarijo ConceptionM.D.   On: 07/26/2022 10:40     Medications:     Scheduled Medications:  apixaban  5 mg Oral BID   ARIPiprazole  5 mg Oral Daily   atorvastatin  40 mg Oral QHS   budesonide (PULMICORT) nebulizer solution  0.25 mg Nebulization BID   Chlorhexidine Gluconate Cloth  6 each Topical Daily   clonazePAM  0.5 mg Oral BID   doxepin  100 mg Oral QHS   FLUoxetine  40 mg Oral Daily   furosemide  80 mg Intravenous Q12H   insulin aspart  0-9 Units Subcutaneous TID WC   ipratropium-albuterol  3 mL Nebulization TID   midodrine  5 mg Oral TID WC   nicotine  21 mg Transdermal Daily   pantoprazole  40 mg Oral Daily   sodium chloride flush  3 mL Intravenous Q12H   sodium chloride flush  3 mL Intravenous Q12H    Infusions:  sodium chloride      PRN Medications: sodium  chloride, acetaminophen, dicyclomine, metoprolol tartrate, ondansetron (ZOFRAN) IV, sodium chloride flush    Patient Profile     Assessment/Plan    1. Acute hypoxemic respiratory failure: Now on 10 L oxygen.  Has COPD, but does not wear oxygen at home. She was smoking up to this admission.  Suspect combination of AECOPD and RV failure/cor pulmonale.  No evidence for PE by V/Q scan. RHC with low PCWP and elevated right heart pressures, suggesting cor pulmonale.  -VQ scan negative.  - Currently on nebulized budesonide 2. RV failure/cor pulmonale: Echo showed EF 60-65%, D-shaped septum, moderate RV enlargement with severely decreased RV systolic function, IVC dilated. RHC today with low PCWP, elevated right-sided filling pressures, mild PAH, preserved cardiac output. Suspect RV failure due to parenchymal lung disease (emphysema, smoking-related ILD noted on HRCT 4/23).   - VQ scan negative.  - Volume status improved.   - Continue low dose midodrine.  -  Add 12.5 mg spiro daily.  - Renal function stable.  3. 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  HRCT 4/23 showed emphysema and smoking-related ILD.  V/Q scan did not show evidence for PE.  - Treatment will be diuresis, oxygen.  4. COPD: Emphysema on chest imaging.  Active smoker at admission.  - Needs to quit smoking.  5. Atrial fibrillation: Paroxysmal.  AF with RVR noted overnight but now back in NSR.  - Maintaining SR.  - Continue  Eliquis.  Co-Pay $45.00. She will need 30 day card.  6. AKI: Creatinine 2.6 at admission, down to 1.16 today. Follow closely, suspect cardiorenal syndrome.  7. Cirrhosis: Possible cirrhosis by RUQ [place].  This may be due to RV failure.  8. 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    Length of Stay: 4  Amy Clegg, NP  07/27/2022, 7:35 AM  Advanced Heart Failure Team Pager [phone] (M-F; 7a - 5p)  Please contact [facility] for night-coverage after  hours (5p -7a ) and weekends on [email or web address]  Patient seen with NP, agree with the above note.   She diuresed well yesterday, weight down.  Creatinine lower 1.46 =>1.16.   Says her breathing is better but still on 10L [place].   General: NAD Neck: JVP 8-9 cm, no thyromegaly or thyroid nodule.  Lungs: Rhonchi bilaterally.  CV: Nondisplaced PMI.  Heart regular S1/S2, no S3/S4, no murmur.  No peripheral edema.   Abdomen: Soft, nontender, no hepatosplenomegaly, no distention.  Skin: Intact without lesions or rashes.  Neurologic: Alert and oriented x 3.  Psych: Normal affect. Extremities: No clubbing or cyanosis.  HEENT: Normal.   RHC yesterday showed R>>L heart failure, cor pulmonale in setting of parenchymal lung disease (emphysema, smoking-related ILD).  On exam, still appears to have elevated right-sided filling pressures.  - Would give 1 more dose of IV Lasix today since creatinine has continued to trend down. Transition to po tomorrow, ?torsemide 20 daily.  - Agree with spironolactone 12.5 mg daily.  - Continue low dose midodrine.  - Consider SGLT2 inhibitor.   Quite significant underlying lung disease, still on 10 L oxygen, do not think we can blame this O2 requirement on CHF.  Consider pulmonary consultation.   Loralie Champagne 07/27/2022 9:03 AM

## 2022-07-27 NOTE — Assessment & Plan Note (Signed)
Patient is back on sinus rhythm, continue anticoagulation with apixaban. Continue telemetry monitoring

## 2022-07-27 NOTE — Care Management Important Message (Signed)
Important Message  Patient Details  Name: [name] MRN: [id number] Date of Birth: September 09, 1961   Medicare Important Message Given:  Yes     Shelda Altes 07/27/2022, 9:28 AM

## 2022-07-28 DIAGNOSIS — I5033 Acute on chronic diastolic (congestive) heart failure: Secondary | ICD-10-CM | POA: Diagnosis not present

## 2022-07-28 LAB — SEDIMENTATION RATE: Sed Rate: 95 mm/hr — ABNORMAL HIGH (ref 0–22)

## 2022-07-28 LAB — CBC
HCT: 35.7 % — ABNORMAL LOW (ref 36.0–46.0)
Hemoglobin: 11.6 g/dL — ABNORMAL LOW (ref 12.0–15.0)
MCH: 28.4 pg (ref 26.0–34.0)
MCHC: 32.5 g/dL (ref 30.0–36.0)
MCV: 87.3 fL (ref 80.0–100.0)
Platelets: 247 10*3/uL (ref 150–400)
RBC: 4.09 MIL/uL (ref 3.87–5.11)
RDW: 19.1 % — ABNORMAL HIGH (ref 11.5–15.5)
WBC: 8.8 10*3/uL (ref 4.0–10.5)
nRBC: 0.2 % (ref 0.0–0.2)

## 2022-07-28 LAB — GLUCOSE, CAPILLARY
Glucose-Capillary: 130 mg/dL — ABNORMAL HIGH (ref 70–99)
Glucose-Capillary: 118 mg/dL — ABNORMAL HIGH (ref 70–99)
Glucose-Capillary: 128 mg/dL — ABNORMAL HIGH (ref 70–99)
Glucose-Capillary: 151 mg/dL — ABNORMAL HIGH (ref 70–99)

## 2022-07-28 LAB — BASIC METABOLIC PANEL
Anion gap: 11 (ref 5–15)
BUN: 34 mg/dL — ABNORMAL HIGH (ref 8–23)
CO2: 28 mmol/L (ref 22–32)
Calcium: 8.9 mg/dL (ref 8.9–10.3)
Chloride: 97 mmol/L — ABNORMAL LOW (ref 98–111)
Creatinine, Ser: 1.16 mg/dL — ABNORMAL HIGH (ref 0.44–1.00)
GFR, Estimated: 53 mL/min — ABNORMAL LOW (ref >60)
Glucose, Bld: 136 mg/dL — ABNORMAL HIGH (ref 70–99)
Potassium: 4.2 mmol/L (ref 3.5–5.1)
Sodium: 136 mmol/L (ref 135–145)

## 2022-07-28 LAB — C-REACTIVE PROTEIN: CRP: 18 mg/dL — ABNORMAL HIGH (ref <1.0)

## 2022-07-28 LAB — BRAIN NATRIURETIC PEPTIDE: B Natriuretic Peptide: 625.8 pg/mL — ABNORMAL HIGH (ref 0.0–100.0)

## 2022-07-28 LAB — TROPONIN I (HIGH SENSITIVITY)
Troponin I (High Sensitivity): 21 ng/L — ABNORMAL HIGH (ref <18)
Troponin I (High Sensitivity): 25 ng/L — ABNORMAL HIGH (ref <18)

## 2022-07-28 MED ORDER — DAPAGLIFLOZIN PROPANEDIOL 10 MG PO TABS
10.0000 mg | ORAL_TABLET | Freq: Every day | ORAL | Status: DC
  Administered 2022-07-28 – 2022-08-04 (×8): 10 mg via ORAL
  Filled 2022-07-28 (×8): qty 1

## 2022-07-28 MED ORDER — NICOTINE 7 MG/24HR TD PT24
7.0000 mg | MEDICATED_PATCH | Freq: Every day | TRANSDERMAL | Status: DC
  Administered 2022-07-29 – 2022-08-04 (×7): 7 mg via TRANSDERMAL
  Filled 2022-07-28 (×7): qty 1

## 2022-07-28 MED ORDER — MIDODRINE HCL 5 MG PO TABS
5.0000 mg | ORAL_TABLET | Freq: Three times a day (TID) | ORAL | Status: DC
  Administered 2022-07-28 – 2022-08-03 (×17): 5 mg via ORAL
  Filled 2022-07-28 (×17): qty 1

## 2022-07-28 MED ORDER — TORSEMIDE 20 MG PO TABS
20.0000 mg | ORAL_TABLET | Freq: Every day | ORAL | Status: DC
  Administered 2022-07-28 – 2022-07-29 (×2): 20 mg via ORAL
  Filled 2022-07-28 (×2): qty 1

## 2022-07-28 NOTE — Progress Notes (Addendum)
PROGRESS NOTE    [name]  SWF:[id number] DOB: Nov 11, 1960 DOA: 07/23/2022 PCP: Ginger Organ., MD  61 y/o female active smoker w/ h/o COPD, HTN, HLD, Type 2DM and chronic pain syndrome/ fibromyalgia, who presented to ED on 11/3 w/ complains of 3 wk h/o progressive SOB, cough and wheezing,   In ED, found to be in respiratory distress and tachypneic, BNP 905. Creat 2.6 CXR w/ mild diffuse interstitial densities bilaterally c/w pulmonary edema vs possible atypical inflammation.  2D Echo showed severe RV failure.  LVEF 60-65%, GIDD, severely reduced RV systolic function -Diuresed with IV Lasix, advanced heart failure team following, still remains profoundly hypoxic -HRCT 11/7: New mid and upper lung groundglass opacities favored to represent CHF, mediastinal adenopathy noted as well  Subjective: -Breathing is improving, still some shortness of breath, dry cough  Assessment and Plan:  Acute Hypoxic resp failure -multifactorial, CHF, with underlying COPD -VQ scan negative for PE -diuresed with IV lasix, -12 L, oxygen requirement remains high -HRCT 4/23, concerning for emphysema and interstitial fibrosis, still smoking -Repeat CT chest last night with new upper and middle lobe groundglass opacities favored to represent CHF, but wedge pressure is down and appears adequately diuresed, neg 12L -will request pulmonary input   Acute on chronic diastolic CHF RV failure, Cor Pulmonale Pulm HTN -Echo preserved LV systolic function EF 60 to 22%,, RV systolic function with severe reduction, -RHC 11/6 showed R>>L heart failure, c low wedge and elevated right heart pressures suggesting cor pulmonale -diuresed w/ IV lasix -remains on midodrine   Acute exacerbation of COPD with asthma ([place]) -on bronchodilator therapy   P.Afib -in NSR, continue eliquis   AKI  Hypokalemia  -creat 2.6 on admission, cardiorenal, improving now 1.1   Cirrhosis -suspected on RUQ US-11/3 -could be from  RV failure, check hepatitis C   HIV positive screen -HIV viral RNA not detected, confirmatory test pending   Essential hypertension Continue blood pressure support with midodrine.    Acid reflux disease Continue acid suppressive therapy.    Type 2 diabetes mellitus with hyperlipidemia (HCC) -CBGs stable   Fibromyalgia Depression,  Continue with aripiprazole, clonazepam    Class 2 obesity Calculated BMI is 36.3      DVT prophylaxis:apixaban Code Status: FUll Code Family Communication: None present Disposition: Home pending improvement in hypoxia   Antimicrobials:    Objective: Vitals:   07/28/22 0004 07/28/22 0453 07/28/22 0600 07/28/22 0800  BP: (!) 116/47 (!) 178/151 (!) 118/54 99/64  Pulse: 77 87 78 90  Resp: '20 20 20 10  '$ Temp: 97.8 F (36.6 C) 98.2 F (36.8 C)  98 F (36.7 C)  TempSrc: Oral Oral  Oral  SpO2: 96% 96% (!) 88% 95%  Weight:  92.3 kg    Height:        Intake/Output Summary (Last 24 hours) at 07/28/2022 1201 Last data filed at 07/28/2022 0454 Gross per 24 hour  Intake 480 ml  Output 1400 ml  Net -920 ml   Filed Weights   07/26/22 1251 07/27/22 0400 07/28/22 0453  Weight: 97.1 kg 96.2 kg 92.3 kg    Examination:  General exam: Appears calm and comfortable  HEENT: Neck obese unable to assess JVD Respiratory system: Few bilateral Rales Cardiovascular system: S1 & S2 heard, RRR.  Abd: nondistended, soft and nontender.Normal bowel sounds heard. Central nervous system: Alert and oriented. No focal neurological deficits. Extremities: no edema Skin: No rashes Psychiatry:  Mood & affect appropriate.  Data Reviewed:   CBC: Recent Labs  Lab 07/23/22 0952 07/23/22 1423 07/26/22 1633 07/27/22 0059 07/28/22 0121  WBC 13.0*  --   --  5.9 8.8  NEUTROABS 10.5*  --   --   --   --   HGB 11.0* 11.6* 12.2  11.9* 10.7* 11.6*  HCT 36.0 34.0* 36.0  35.0* 35.0* 35.7*  MCV 91.4  --   --  89.3 87.3  PLT 230  --   --  273 160   Basic  Metabolic Panel: Recent Labs  Lab 07/24/22 0107 07/25/22 0048 07/26/22 0032 07/26/22 1633 07/27/22 0059 07/28/22 0121  NA 143 140 139 140  141 139 136  K 4.3 3.8 3.4* 4.2  4.2 4.2 4.2  CL 104 103 101  --  98 97*  CO2 21* 21* 27  --  26 28  GLUCOSE 156* 125* 131*  --  117* 136*  BUN 49* 58* 45*  --  36* 34*  CREATININE 2.17* 1.69* 1.46*  --  1.16* 1.16*  CALCIUM 8.3* 8.3* 8.5*  --  8.9 8.9  MG  --   --   --   --  1.8  --    GFR: Estimated Creatinine Clearance: 56.8 mL/min (A) (by C-G formula based on SCr of 1.16 mg/dL (H)). Liver Function Tests: Recent Labs  Lab 07/23/22 0952 07/24/22 0107  AST 51* 38  ALT 12 9  ALKPHOS 157* 141*  BILITOT 1.1 1.9*  PROT 6.5 6.6  ALBUMIN 2.0* 2.4*   No results for input(s): "LIPASE", "AMYLASE" in the last 168 hours. Recent Labs  Lab 07/24/22 0059  AMMONIA 25   Coagulation Profile: Recent Labs  Lab 07/23/22 1255 07/24/22 0107  INR 1.4* 1.4*   Cardiac Enzymes: No results for input(s): "CKTOTAL", "CKMB", "CKMBINDEX", "TROPONINI" in the last 168 hours. BNP (last 3 results) No results for input(s): "PROBNP" in the last 8760 hours. HbA1C: No results for input(s): "HGBA1C" in the last 72 hours. CBG: Recent Labs  Lab 07/27/22 1100 07/27/22 1549 07/27/22 2111 07/28/22 0542 07/28/22 1111  GLUCAP 154* 129* 179* 130* 118*   Lipid Profile: No results for input(s): "CHOL", "HDL", "LDLCALC", "TRIG", "CHOLHDL", "LDLDIRECT" in the last 72 hours. Thyroid Function Tests: No results for input(s): "TSH", "T4TOTAL", "FREET4", "T3FREE", "THYROIDAB" in the last 72 hours. Anemia Panel: No results for input(s): "VITAMINB12", "FOLATE", "FERRITIN", "TIBC", "IRON", "RETICCTPCT" in the last 72 hours. Urine analysis:    Component Value Date/Time   COLORURINE YELLOW 11/22/2019 1351   APPEARANCEUR CLEAR 11/22/2019 1351   LABSPEC 1.008 11/22/2019 1351   PHURINE 5.0 11/22/2019 1351   GLUCOSEU NEGATIVE 11/22/2019 1351   HGBUR SMALL (A)  11/22/2019 1351   BILIRUBINUR NEGATIVE 11/22/2019 1351   KETONESUR NEGATIVE 11/22/2019 1351   PROTEINUR NEGATIVE 11/22/2019 1351   UROBILINOGEN 0.2 11/02/2012 1530   NITRITE NEGATIVE 11/22/2019 1351   LEUKOCYTESUR NEGATIVE 11/22/2019 1351   Sepsis Labs: '@LABRCNTIP'$ (procalcitonin:4,lacticidven:4)  ) Recent Results (from the past 240 hour(s))  Resp Panel by RT-PCR (Flu A&B, Covid) Anterior Nasal Swab     Status: None   Collection Time: 07/23/22  9:52 AM   Specimen: Anterior Nasal Swab  Result Value Ref Range Status   SARS Coronavirus 2 by RT PCR NEGATIVE NEGATIVE Final    Comment: (NOTE) SARS-CoV-2 target nucleic acids are NOT DETECTED.  The SARS-CoV-2 RNA is generally detectable in upper respiratory specimens during the acute phase of infection. The lowest concentration of SARS-CoV-2 viral copies this assay can detect is 138  copies/mL. A negative result does not preclude SARS-Cov-2 infection and should not be used as the sole basis for treatment or other patient management decisions. A negative result may occur with  improper specimen collection/handling, submission of specimen other than nasopharyngeal swab, presence of viral mutation(s) within the areas targeted by this assay, and inadequate number of viral copies(<138 copies/mL). A negative result must be combined with clinical observations, patient history, and epidemiological information. The expected result is Negative.  Fact Sheet for Patients:  EntrepreneurPulse.com.au  Fact Sheet for Healthcare Providers:  IncredibleEmployment.be  This test is no t yet approved or cleared by the [place] FDA and  has been authorized for detection and/or diagnosis of SARS-CoV-2 by FDA under an Emergency Use Authorization (EUA). This EUA will remain  in effect (meaning this test can be used) for the duration of the COVID-19 declaration under Section 564(b)(1) of the Act, 21 U.S.C.section  360bbb-3(b)(1), unless the authorization is terminated  or revoked sooner.       Influenza A by PCR NEGATIVE NEGATIVE Final   Influenza B by PCR NEGATIVE NEGATIVE Final    Comment: (NOTE) The Xpert Xpress SARS-CoV-2/FLU/RSV plus assay is intended as an aid in the diagnosis of influenza from Nasopharyngeal swab specimens and should not be used as a sole basis for treatment. Nasal washings and aspirates are unacceptable for Xpert Xpress SARS-CoV-2/FLU/RSV testing.  Fact Sheet for Patients: EntrepreneurPulse.com.au  Fact Sheet for Healthcare Providers: IncredibleEmployment.be  This test is not yet approved or cleared by the [place] FDA and has been authorized for detection and/or diagnosis of SARS-CoV-2 by FDA under an Emergency Use Authorization (EUA). This EUA will remain in effect (meaning this test can be used) for the duration of the COVID-19 declaration under Section 564(b)(1) of the Act, 21 U.S.C. section 360bbb-3(b)(1), unless the authorization is terminated or revoked.  Performed at [facility], [place] [address] [place], [place] [address]   Respiratory (~20 pathogens) panel by PCR     Status: None   Collection Time: 07/25/22  6:15 PM   Specimen: Nasopharyngeal Swab; Respiratory  Result Value Ref Range Status   Adenovirus NOT DETECTED NOT DETECTED Final   Coronavirus 229E NOT DETECTED NOT DETECTED Final    Comment: (NOTE) The Coronavirus on the Respiratory Panel, DOES NOT test for the novel  Coronavirus (2019 nCoV)    Coronavirus HKU1 NOT DETECTED NOT DETECTED Final   Coronavirus NL63 NOT DETECTED NOT DETECTED Final   Coronavirus OC43 NOT DETECTED NOT DETECTED Final   Metapneumovirus NOT DETECTED NOT DETECTED Final   Rhinovirus / Enterovirus NOT DETECTED NOT DETECTED Final   Influenza A NOT DETECTED NOT DETECTED Final   Influenza B NOT DETECTED NOT DETECTED Final   Parainfluenza Virus 1 NOT DETECTED NOT DETECTED Final    Parainfluenza Virus 2 NOT DETECTED NOT DETECTED Final   Parainfluenza Virus 3 NOT DETECTED NOT DETECTED Final   Parainfluenza Virus 4 NOT DETECTED NOT DETECTED Final   Respiratory Syncytial Virus NOT DETECTED NOT DETECTED Final   Bordetella pertussis NOT DETECTED NOT DETECTED Final   Bordetella Parapertussis NOT DETECTED NOT DETECTED Final   Chlamydophila pneumoniae NOT DETECTED NOT DETECTED Final   Mycoplasma pneumoniae NOT DETECTED NOT DETECTED Final    Comment: Performed at [facility], [address] [place], [place] [address]     Radiology Studies: CT Chest High Resolution  Result Date: 07/28/2022 CLINICAL DATA:  Interstitial lung disease EXAM: CT CHEST WITHOUT CONTRAST TECHNIQUE: Multidetector CT imaging  of the chest was performed following the standard protocol without intravenous contrast. High resolution imaging of the lungs, as well as inspiratory and expiratory imaging, was performed. RADIATION DOSE REDUCTION: This exam was performed according to the departmental dose-optimization program which includes automated exposure control, adjustment of the mA and/or kV according to patient size and/or use of iterative reconstruction technique. COMPARISON:  Chest CT dated January 06, 2022 FINDINGS: Cardiovascular: Normal heart size. Trace pericardial effusion. Normal caliber thoracic aorta with moderate calcified plaque. Coronary artery calcifications. Dilated main pulmonary artery, measuring up to 3.7 cm on series 5, image 65. Mediastinum/Nodes: Esophagus and thyroid are unremarkable. Mediastinal lymph nodes are increased in size when compared with prior exam. Reference right upper paratracheal lymph node measuring 1.8 cm in short axis on series 5, image 46, previously measured 1.1 cm in short axis. Reference subcarinal lymph node measuring 2.0 cm in short axis, previously measured 1.1 cm. Lungs/Pleura: Central airways are patent. No definite air trapping, although expiratory phase images  are technically inadequate. Moderate centrilobular emphysema. New diffuse mid and upper lung predominant ground-glass opacities with associated fissural thickening. Previously described reticular opacities are not well visualized due to superimposed acute opacities. No pleural effusion or pneumothorax. Upper Abdomen: No acute abnormality. Musculoskeletal: No chest wall mass or suspicious bone lesions identified. IMPRESSION: 1. New mid and upper lung predominant diffuse ground-glass opacities with associated fissural thickening, findings are favored to be due to pulmonary edema. Infectious or inflammatory etiology are additional considerations. 2. Mediastinal lymph nodes are increased in size when compared with prior exam, likely reactive, but somewhat greater than size than expected. Recommend follow-up chest CT in 3 months to ensure resolution. 3. Dilated main pulmonary artery, findings can be seen in the setting of pulmonary hypertension. 4. Aortic Atherosclerosis (ICD10-I70.0) and Emphysema (ICD10-J43.9). Electronically Signed   By: Yetta Glassman M.D.   On: 07/28/2022 11:13   CARDIAC CATHETERIZATION  Result Date: 07/26/2022 1. Low PCWP 2. Mildly elevated right-sided filling pressures. 3. Mild pulmonary arterial hypertesion, suspect group 3 with parenchymal lung disease. 4. PAPI is normal. Predominant RV failure.   NM Pulmonary Perfusion  Result Date: 07/26/2022 CLINICAL DATA:  Shortness of breath question pulmonary embolism EXAM: NUCLEAR MEDICINE PERFUSION LUNG SCAN TECHNIQUE: Perfusion images were obtained in multiple projections after intravenous injection of radiopharmaceutical. Ventilation scans intentionally deferred if perfusion scan and chest x-ray adequate for interpretation during COVID 19 epidemic. RADIOPHARMACEUTICALS:  3.8 mCi Tc-63mMAA IV COMPARISON:  Chest radiograph 07/26/2022 FINDINGS: Minimal peripheral irregularity of perfusion in the upper lobes slightly greater on RIGHT. Overall  pattern is nonsegmental. Findings favor parenchymal lung disease. No segmental or subsegmental perfusion defects to suggest pulmonary embolism. IMPRESSION: Perfusion lung scan changes consistent with parenchymal lung disease, corresponding to chronic interstitial lung disease changes identified on chest radiographs. Pulmonary embolism absent. Electronically Signed   By: MLavonia DanaM.D.   On: 07/26/2022 15:28     Scheduled Meds:  apixaban  5 mg Oral BID   ARIPiprazole  5 mg Oral Daily   atorvastatin  40 mg Oral QHS   budesonide (PULMICORT) nebulizer solution  0.25 mg Nebulization BID   Chlorhexidine Gluconate Cloth  6 each Topical Daily   clonazePAM  0.5 mg Oral BID   dapagliflozin propanediol  10 mg Oral Daily   doxepin  100 mg Oral QHS   FLUoxetine  40 mg Oral Daily   insulin aspart  0-9 Units Subcutaneous TID WC   ipratropium-albuterol  3 mL Nebulization TID  midodrine  5 mg Oral TID WC   nicotine  21 mg Transdermal Daily   pantoprazole  40 mg Oral Daily   sodium chloride flush  3 mL Intravenous Q12H   sodium chloride flush  3 mL Intravenous Q12H   spironolactone  12.5 mg Oral Daily   torsemide  20 mg Oral Daily   Continuous Infusions:  sodium chloride       LOS: 5 days    Time spent: 61mn  PDomenic Polite MD [facility]   07/28/2022, 12:01 PM

## 2022-07-28 NOTE — Progress Notes (Signed)
NAME:  JUSTYNE ROELL, MRN:  [id number], DOB:  [birth year], LOS: 5 ADMISSION DATE:  07/23/2022, CONSULTATION DATE: 07/28/2022 REFERRING MD: [facility], CHIEF COMPLAINT: Refractory hypoxia  History of Present Illness:  61 year old lifelong smoker who was seen by our practice in 2014 with recommendations to quit smoking following surgery of the abdomen.  He now presents after being noncompliant with medications and not seeking medical help until she was extremely edematous and short of breath.  She has been aggressively diuresed to 12 L and reports she is breathing better.  Her weight on admission was 228 pounds and her weight today 07/28/2022 203 pounds.  She has been aggressively diuresed with improvement.  CT scan does show groundglass opacities.  2D echo reveals RV dysfunction status post right heart cath with RV dysfunction.  Pulmonary critical care asked to evaluate groundglass disease for aggressive diuresis questionable role for steroids to follow.  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, type 2   Diverticulitis    Dysplasia of cervix (uteri)    patient states she had dysplasia of the uterus stage 4   Endometrial cancer (HCC)    Fibromyalgia    Fibromyalgia    GERD (gastroesophageal reflux disease)    Headache(784.0)    migraine headache   Hyperlipidemia    Hypertension    Kidney stones    at least 6 stones in past   Pneumonia 2019   Restless leg syndrome    Bilateral   Vomiting    last night at 2300 after last round of antibiotic     Significant Hospital Events: Including procedures, antibiotic start and stop dates in addition to other pertinent events     Interim History / Subjective:  -12  L reports feeling better after aggressive diuresis  Objective   Blood pressure 99/64, pulse 90, temperature 98 F (36.7 C), temperature source Oral, resp. rate 10, height '5\' 6"'$  (1.676 m), weight 92.3 kg, last menstrual period 04/20/2013, SpO2 95 %.    FiO2 (%):  [50 %-93 %] 93 %   Intake/Output Summary (Last 24 hours) at 07/28/2022 1243 Last data filed at 07/28/2022 0454 Gross per 24 hour  Intake 480 ml  Output 1400 ml  Net -920 ml   Filed Weights   07/26/22 1251 07/27/22 0400 07/28/22 0453  Weight: 97.1 kg 96.2 kg 92.3 kg    Examination: General: Morbidly obese deconditioned female no acute distress HENT: No JVD is appreciated Lungs: Decreased breath sounds throughout faint crackles Cardiovascular: Heart sounds are regular Abdomen: Obese soft nontender Extremities: Decreased edema Neuro: Flat affect   Resolved Hospital Problem list     Assessment & Plan:  Acute on chronic hypoxic respiratory failure in the setting of continued tobacco abuse, RV dysfunction, noncompliance with extreme volume overload and still requiring 12 L nasal cannula despite 12 L negative intake and output.  CT scan with groundglass opacities bilateral.  Questionable component of pneumonia.  2D echo with RV dysfunction. Agree with continued aggressive diuresis Wean FiO2 as tolerated Smoking cessation Questionable role for steroids at this time Continue positive pressure ventilation nocturnally Serial chest x-rays Follow-up with pulmonary as an outpatient  Diastolic dysfunction Per primary/cardiology  Noncompliance Counseling  Tobacco abuse Decrease nicotine patch to 7 mg. Smoking cessation  Morbid obesity Weight loss  Best Practice (right click and "Reselect all SmartList Selections" daily)   Diet/type: Regular consistency (see orders) DVT prophylaxis: DOAC GI prophylaxis: PPI Lines: N/A Foley:  N/A Code Status:  full code Last date of multidisciplinary goals of care discussion  [tbd]  Labs   CBC: Recent Labs  Lab 07/23/22 0952 07/23/22 1423 07/26/22 1633 07/27/22 0059 07/28/22 0121  WBC 13.0*  --   --  5.9 8.8  NEUTROABS 10.5*  --   --   --   --   HGB 11.0* 11.6* 12.2  11.9* 10.7* 11.6*  HCT 36.0 34.0* 36.0  35.0* 35.0* 35.7*  MCV 91.4  --   --  89.3 87.3  PLT 230  --   --  273 992    Basic Metabolic Panel: Recent Labs  Lab 07/24/22 0107 07/25/22 0048 07/26/22 0032 07/26/22 1633 07/27/22 0059 07/28/22 0121  NA 143 140 139 140  141 139 136  K 4.3 3.8 3.4* 4.2  4.2 4.2 4.2  CL 104 103 101  --  98 97*  CO2 21* 21* 27  --  26 28  GLUCOSE 156* 125* 131*  --  117* 136*  BUN 49* 58* 45*  --  36* 34*  CREATININE 2.17* 1.69* 1.46*  --  1.16* 1.16*  CALCIUM 8.3* 8.3* 8.5*  --  8.9 8.9  MG  --   --   --   --  1.8  --    GFR: Estimated Creatinine Clearance: 56.8 mL/min (A) (by C-G formula based on SCr of 1.16 mg/dL (H)). Recent Labs  Lab 07/23/22 0952 07/27/22 0059 07/28/22 0121  WBC 13.0* 5.9 8.8    Liver Function Tests: Recent Labs  Lab 07/23/22 0952 07/24/22 0107  AST 51* 38  ALT 12 9  ALKPHOS 157* 141*  BILITOT 1.1 1.9*  PROT 6.5 6.6  ALBUMIN 2.0* 2.4*   No results for input(s): "LIPASE", "AMYLASE" in the last 168 hours. Recent Labs  Lab 07/24/22 0059  AMMONIA 25    ABG    Component Value Date/Time   PHART 7.44 07/24/2022 0529   PCO2ART 35 07/24/2022 0529   PO2ART 119 (H) 07/24/2022 0529   HCO3 30.8 (H) 07/26/2022 1633   HCO3 31.0 (H) 07/26/2022 1633   TCO2 32 07/26/2022 1633   TCO2 32 07/26/2022 1633   ACIDBASEDEF 0.1 07/24/2022 0529   O2SAT 53 07/26/2022 1633   O2SAT 51 07/26/2022 1633     Coagulation Profile: Recent Labs  Lab 07/23/22 1255 07/24/22 0107  INR 1.4* 1.4*    Cardiac Enzymes: No results for input(s): "CKTOTAL", "CKMB", "CKMBINDEX", "TROPONINI" in the last 168 hours.  HbA1C: Hgb A1c MFr Bld  Date/Time Value Ref Range Status  07/23/2022 12:55 PM 6.2 (H) 4.8 - 5.6 % Final    Comment:     (NOTE) Pre diabetes:          5.7%-6.4%  Diabetes:              >6.4%  Glycemic control for   <7.0% adults with diabetes   05/18/2019 11:31 AM 6.8 (H) 4.8 - 5.6 % Final    Comment:    (NOTE) Pre diabetes:          5.7%-6.4% Diabetes:              >6.4% Glycemic control for   <7.0% adults with  diabetes     CBG: Recent Labs  Lab 07/27/22 1100 07/27/22 1549 07/27/22 2111 07/28/22 0542 07/28/22 1111  GLUCAP 154* 129* 179* 130* 118*    Review of Systems:   10 point review of system taken, please see HPI for positives and negatives. Positive for increased shortness of breath over several months Noncompliance with medication as an outpatient Positive for fevers prior to admission Positive for sweats prior to admission  Past Medical History:  She,  has a past medical history of Acid reflux disease, Anxiety, Asthma, Bronchitis, chronic (HCC), Chronic pain syndrome, COPD (chronic obstructive pulmonary disease) ([place]), Depression, Diabetes mellitus without complication ([place]), Diverticulitis, Dysplasia of cervix (uteri), Endometrial cancer ([place]), Fibromyalgia, Fibromyalgia, GERD (gastroesophageal reflux disease), Headache(784.0), Hyperlipidemia, Hypertension, Kidney stones, Pneumonia (2019), Restless leg syndrome, and Vomiting.   Surgical History:   Past Surgical History:  Procedure Laterality Date   ABDOMINAL HYSTERECTOMY     APPENDECTOMY     APPLICATION OF WOUND VAC N/A 11/01/2012   Procedure: APPLICATION OF WOUND VAC;  Surgeon: Joyice Faster. Cornett, MD;  Location: [facility];  Service: General;  Laterality: N/A;   CHOLECYSTECTOMY     COLONOSCOPY  2013   COLOSTOMY     COLOSTOMY CLOSURE N/A 11/01/2012   Procedure: COLOSTOMY CLOSURE;  Surgeon: Joyice Faster. Cornett, MD;  Location: [facility];  Service: General;  Laterality: N/A;  REPAIR PARASTOMAL HERNIA; REVISION OF COLOSTOMY   COLOSTOMY CLOSURE N/A 05/03/2013   Procedure: COLOSTOMY CLOSURE;  Surgeon: Joyice Faster. Cornett, MD;  Location: [facility];   Service: General;  Laterality: N/A;   DILATION AND CURETTAGE OF UTERUS N/A 05/24/2019   Procedure: DILATATION AND CURETTAGE;  Surgeon: Gery Pray, MD;  Location: [facility];  Service: Gynecology;  Laterality: N/A;   ESOPHAGOGASTRODUODENOSCOPY Left 11/27/2012   Procedure: ESOPHAGOGASTRODUODENOSCOPY (EGD);  Surgeon: Winfield Cunas., MD;  Location: [facility];  Service: Gastroenterology;  Laterality: Left;   INCISIONAL HERNIA REPAIR N/A 11/01/2012   Procedure: HERNIA REPAIR INCISIONAL;  Surgeon: Joyice Faster. Cornett, MD;  Location: [facility];  Service: General;  Laterality: N/A;   LAPAROTOMY  11/01/2012   Procedure: EXPLORATORY LAPAROTOMY;  Surgeon: Joyice Faster. Cornett, MD;  Location: [facility];  Service: General;;  exploratory laparotomy,repair of parastomal hernia and incisional hernia, lysis of adhesions, and application of wound V.A.C.   LYSIS OF ADHESION N/A 11/01/2012   Procedure: LYSIS OF ADHESION;  Surgeon: Joyice Faster. Cornett, MD;  Location: [facility];  Service: General;  Laterality: N/A;   OPERATIVE ULTRASOUND N/A 06/14/2019   Procedure: OPERATIVE ULTRASOUND;  Surgeon: Gery Pray, MD;  Location: [facility];  Service: Urology;  Laterality: N/A;   PARASTOMAL HERNIA REPAIR N/A 11/01/2012   Procedure: HERNIA REPAIR PARASTOMAL;  Surgeon: Joyice Faster. Cornett, MD;  Location: [facility];  Service: General;  Laterality: N/A;   PARTIAL HYSTERECTOMY     partial   RIGHT HEART CATH N/A 07/26/2022   Procedure: RIGHT HEART CATH;  Surgeon: Larey Dresser, MD;  Location: [facility];  Service: Cardiovascular;  Laterality: N/A;   TANDEM RING INSERTION N/A 05/24/2019   Procedure: INSERTION OF HYMEN CAPSULE;  Surgeon: Gery Pray, MD;  Location: [facility];  Service: Gynecology;  Laterality: N/A;   TANDEM RING INSERTION N/A 06/14/2019   Procedure: HYMEN CAPSULE PLACEMENT;  Surgeon: Gery Pray, MD;  Location: [facility];  Service: Urology;  Laterality: N/A;     Social History:   reports that she quit smoking about 13 years ago.  Her smoking use included cigarettes. She  smoked an average of .5 packs per day. She has never used smokeless tobacco. She reports that she does not drink alcohol and does not use drugs.   Family History:  Her family history includes Diabetes in her mother; Throat cancer in her mother; Uterine cancer in her sister. There is no history of Deep vein thrombosis or Pulmonary embolism.   Allergies Allergies  Allergen Reactions   Tramadol     Per Dr. Sharyon Medicus is renal failure, patient states that she is currently taking this medication and she has not had a reaction to Tramadol since this time.     Home Medications  Prior to Admission medications   Medication Sig Start Date End Date Taking? Authorizing Provider  acetaminophen (TYLENOL) 500 MG tablet Take 500 mg by mouth every 6 (six) hours as needed.   Yes [provider]  ARIPiprazole (ABILIFY) 5 MG tablet Take 1 tablet (5 mg total) by mouth daily. 05/06/22  Yes Arfeen, Arlyce Harman, MD  aspirin EC 81 MG tablet Take one (1) tablet by mouth daily Oral 10/23/15  Yes [provider]  atorvastatin (LIPITOR) 40 MG tablet Take 40 mg by mouth at bedtime.  01/17/14  Yes [provider]  clonazePAM (KLONOPIN) 1 MG tablet Take 1 tab twice daily 05/06/22  Yes Arfeen, Arlyce Harman, MD  dicyclomine (BENTYL) 10 MG capsule Take 10 mg by mouth every 8 (eight) hours as needed for spasms (abdominal spasms).    Yes [provider]  doxepin (SINEQUAN) 100 MG capsule Take 1 capsule (100 mg total) by mouth at bedtime. 05/06/22 05/06/23 Yes Arfeen, Arlyce Harman, MD  FLUoxetine (PROZAC) 40 MG capsule TAKE 1 CAPSULE BY MOUTH EVERY DAY Patient taking differently: Take 40 mg by mouth daily. 05/06/22  Yes Arfeen, Arlyce Harman, MD  lisinopril (PRINIVIL,ZESTRIL) 20 MG tablet Take 20 mg by mouth daily.  06/15/17  Yes [provider]  omeprazole (PRILOSEC) 40 MG capsule Take 40 mg by mouth daily before breakfast.  06/07/17  Yes [provider]   OVER THE COUNTER MEDICATION Naproxen   Yes [provider]  promethazine (PHENERGAN) 25 MG tablet Take 25 mg by mouth See admin instructions. Every 12 hours PRN 06/23/22  Yes [provider]  VENTOLIN HFA 108 (90 BASE) MCG/ACT inhaler Inhale 2 puffs into the lungs every 6 (six) hours as needed for wheezing or shortness of breath.  07/05/13  Yes [provider]  furosemide (LASIX) 20 MG tablet Take 20 mg by mouth daily as needed. Patient not taking: Reported on 07/23/2022 01/17/21   [provider]  [facility] VERIO test strip CHECK SUGARS TWICE A DAY DX  DM2, INSULIN DEPENDANT 05/15/19   [provider]  prochlorperazine (COMPAZINE) 10 MG tablet Take 1 tablet (10 mg total) by mouth every 6 (six) hours as needed for nausea or vomiting. Patient not taking: Reported on 03/04/2021 06/14/19   Gery Pray, MD     Critical care time: Ferol Luz Sausha Raymond ACNP Acute Care Nurse Practitioner [place] Please consult Amion 07/28/2022, 12:43 PM

## 2022-07-28 NOTE — Progress Notes (Signed)
Physical Therapy Treatment Patient Details Name: [name] MRN: [id number] DOB: Dec 14, 1960 Today's Date: 07/28/2022   History of Present Illness Pt is a 61 y.o. F who presents 07/23/2022 with dyspnea and LE edema. Working diagnosis of heart failure/COPD decompensation. Significant PMH: COPD, HTN, dyslipidemia, fibromyalgia, DM2.    PT Comments    Continues to be limited by decreased activity tolerance and increased O2 demand requiring 12 L O2 HFNC for mobility this date. Instructed patient on safe use of rollator and brake use prior to sitting and standing. Ambulated very short distance with drop in spO2 to 81% requiring 2-3 minute seated rest break to increase back to 90%. Encouraged OOB to chair daily for increasing activity tolerance. No PT follow up recommended at this time, but will continue to follow acutely.     Recommendations for follow up therapy are one component of a multi-disciplinary discharge planning process, led by the attending physician.  Recommendations may be updated based on patient status, additional functional criteria and insurance authorization.  Follow Up Recommendations  No PT follow up (pending progress)     Assistance Recommended at Discharge PRN  Patient can return home with the following A little help with walking and/or transfers;Assistance with cooking/housework;Assist for transportation;Help with stairs or ramp for entrance   Equipment Recommendations  Rollator (4 wheels)    Recommendations for Other Services       Precautions / Restrictions Precautions Precautions: Fall;Other (comment) Precaution Comments: watch O2 Restrictions Weight Bearing Restrictions: No     Mobility  Bed Mobility Overal bed mobility: Modified Independent                  Transfers Overall transfer level: Needs assistance Equipment used: None, Rollator (4 wheels) Transfers: Sit to/from Stand Sit to Stand: Supervision           General transfer  comment: supervision for safety. Instructed patient on rollator mechanics and use of brakes prior to sitting or standing    Ambulation/Gait Ambulation/Gait assistance: Supervision Gait Distance (Feet): 20 Feet (combo of fwds/bwds within O2 line as she was on 12L O2) Assistive device: Rollator (4 wheels) Gait Pattern/deviations: Step-through pattern, Decreased stride length Gait velocity: decreased     General Gait Details: supervisoin for safety with use of rollator. On 12 L HFNC with drop to 81% with short mobility   Stairs             Wheelchair Mobility    Modified Rankin (Stroke Patients Only)       Balance Overall balance assessment: Needs assistance Sitting-balance support: No upper extremity supported, Feet supported Sitting balance-Leahy Scale: Good     Standing balance support: Bilateral upper extremity supported, During functional activity Standing balance-Leahy Scale: Fair                              Cognition Arousal/Alertness: Awake/alert Behavior During Therapy: WFL for tasks assessed/performed Overall Cognitive Status: Within Functional Limits for tasks assessed                                          Exercises      General Comments General comments (skin integrity, edema, etc.): On 12 L HFNC, spO2 94% at rest. With short mobility, drop to 81% requiring 2-3 minute seated rest break to bring back up to 90%  Pertinent Vitals/Pain Pain Assessment Pain Assessment: Faces Faces Pain Scale: Hurts a little bit Pain Location: "all over" Pain Descriptors / Indicators: Discomfort Pain Intervention(s): Monitored during session    Home Living                          Prior Function            PT Goals (current goals can now be found in the care plan section) Acute Rehab PT Goals Patient Stated Goal: did not state PT Goal Formulation: With patient Time For Goal Achievement: 08/08/22 Potential to  Achieve Goals: Good Progress towards PT goals: Progressing toward goals    Frequency    Min 3X/week      PT Plan Current plan remains appropriate    Co-evaluation              AM-PAC PT "6 Clicks" Mobility   Outcome Measure  Help needed turning from your back to your side while in a flat bed without using bedrails?: None Help needed moving from lying on your back to sitting on the side of a flat bed without using bedrails?: None Help needed moving to and from a bed to a chair (including a wheelchair)?: A Little Help needed standing up from a chair using your arms (e.g., wheelchair or bedside chair)?: A Little Help needed to walk in hospital room?: A Little Help needed climbing 3-5 steps with a railing? : A Lot 6 Click Score: 19    End of Session Equipment Utilized During Treatment: Oxygen Activity Tolerance: Patient tolerated treatment well Patient left: in chair;with call bell/phone within reach Nurse Communication: Mobility status PT Visit Diagnosis: Unsteadiness on feet (R26.81);Difficulty in walking, not elsewhere classified (R26.2)     Time: 3734-2876 PT Time Calculation (min) (ACUTE ONLY): 25 min  Charges:  $Therapeutic Activity: 23-37 mins                     Christina Pierrelouis A. Gilford Rile PT, DPT Acute Rehabilitation Services Office [phone]    [name] 07/28/2022, 2:47 PM

## 2022-07-28 NOTE — Progress Notes (Signed)
Patient ID: [name], female   DOB: 20-Mar-1961, 61 y.o.   MRN: [id number]     Advanced Heart Failure Rounding Note  PCP-Cardiologist: None   Subjective:    Good diuresis overall with weight down about 25 lbs from peak.  Denies dyspnea at rest but still on 12L HFNC.    She reports episode of chest pain last night, now resolved.  No history of CAD.   RHC Procedural Findings: Hemodynamics (mmHg) RA mean 11 RV 45/15 PA 44/19, mean 28 PCWP mean 4 Oxygen saturations: PA 52% AO 91% Cardiac Output (Fick) 4.69  Cardiac Index (Fick) 2.28 PVR 5.1 WU Cardiac Output (thermo) 5.46 Cardiac Index (thermo) 2.65  PVR 4.4 WU PAPI 2.3   Objective:   Weight Range: 92.3 kg Body mass index is 32.83 kg/m.   Vital Signs:   Temp:  [97.4 F (36.3 C)-98.2 F (36.8 C)] 98 F (36.7 C) (11/08 0800) Pulse Rate:  [67-97] 90 (11/08 0800) Resp:  [10-20] 10 (11/08 0800) BP: (93-178)/(47-151) 99/64 (11/08 0800) SpO2:  [87 %-96 %] 95 % (11/08 0800) FiO2 (%):  [50 %-93 %] 93 % (11/08 0745) Weight:  [92.3 kg] 92.3 kg (11/08 0453) Last BM Date : 07/24/22  Weight change: Filed Weights   07/26/22 1251 07/27/22 0400 07/28/22 0453  Weight: 97.1 kg 96.2 kg 92.3 kg    Intake/Output:   Intake/Output Summary (Last 24 hours) at 07/28/2022 1107 Last data filed at 07/28/2022 0454 Gross per 24 hour  Intake 531.63 ml  Output 1875 ml  Net -1343.37 ml      Physical Exam    General: NAD Neck: JVP 8 cm, no thyromegaly or thyroid nodule.  Lungs: Occasional rhonchi and bilateral crackles at bases.  CV: Nondisplaced PMI.  Heart regular S1/S2, no S3/S4, no murmur.  No peripheral edema.   Abdomen: Soft, nontender, no hepatosplenomegaly, no distention.  Skin: Intact without lesions or rashes.  Neurologic: Alert and oriented x 3.  Psych: Normal affect. Extremities: No clubbing or cyanosis.  HEENT: Normal.   Telemetry  SR 70-80s personally checked.    EKG    N/A  Labs    CBC Recent Labs     07/27/22 0059 07/28/22 0121  WBC 5.9 8.8  HGB 10.7* 11.6*  HCT 35.0* 35.7*  MCV 89.3 87.3  PLT 273 644   Basic Metabolic Panel Recent Labs    07/27/22 0059 07/28/22 0121  NA 139 136  K 4.2 4.2  CL 98 97*  CO2 26 28  GLUCOSE 117* 136*  BUN 36* 34*  CREATININE 1.16* 1.16*  CALCIUM 8.9 8.9  MG 1.8  --    Liver Function Tests No results for input(s): "AST", "ALT", "ALKPHOS", "BILITOT", "PROT", "ALBUMIN" in the last 72 hours. No results for input(s): "LIPASE", "AMYLASE" in the last 72 hours. Cardiac Enzymes No results for input(s): "CKTOTAL", "CKMB", "CKMBINDEX", "TROPONINI" in the last 72 hours.  BNP: BNP (last 3 results) Recent Labs    07/23/22 0952 07/28/22 0121  BNP 905.0* 625.8*    ProBNP (last 3 results) No results for input(s): "PROBNP" in the last 8760 hours.   D-Dimer No results for input(s): "DDIMER" in the last 72 hours. Hemoglobin A1C No results for input(s): "HGBA1C" in the last 72 hours. Fasting Lipid Panel No results for input(s): "CHOL", "HDL", "LDLCALC", "TRIG", "CHOLHDL", "LDLDIRECT" in the last 72 hours. Thyroid Function Tests No results for input(s): "TSH", "T4TOTAL", "T3FREE", "THYROIDAB" in the last 72 hours.  Invalid input(s): "FREET3"  Other results:  Imaging    No results found.   Medications:     Scheduled Medications:  apixaban  5 mg Oral BID   ARIPiprazole  5 mg Oral Daily   atorvastatin  40 mg Oral QHS   budesonide (PULMICORT) nebulizer solution  0.25 mg Nebulization BID   Chlorhexidine Gluconate Cloth  6 each Topical Daily   clonazePAM  0.5 mg Oral BID   dapagliflozin propanediol  10 mg Oral Daily   doxepin  100 mg Oral QHS   FLUoxetine  40 mg Oral Daily   insulin aspart  0-9 Units Subcutaneous TID WC   ipratropium-albuterol  3 mL Nebulization TID   midodrine  5 mg Oral TID WC   nicotine  21 mg Transdermal Daily   pantoprazole  40 mg Oral Daily   sodium chloride flush  3 mL Intravenous Q12H   sodium  chloride flush  3 mL Intravenous Q12H   spironolactone  12.5 mg Oral Daily   torsemide  20 mg Oral Daily    Infusions:  sodium chloride      PRN Medications: sodium chloride, acetaminophen, dicyclomine, metoprolol tartrate, ondansetron (ZOFRAN) IV, sodium chloride flush    Patient Profile     Assessment/Plan    1. Acute hypoxemic respiratory failure: Now on 12 L oxygen.  Has COPD, but does not wear oxygen at home. She was smoking up to this admission.  Suspect combination of AECOPD (emphysema, smoking-related ILD) and RV failure/cor pulmonale.  No evidence for PE by V/Q scan. RHC with low PCWP and elevated right heart pressures, suggesting cor pulmonale.  Volume status looks good at this point.  Given that she still has significant oxygen requirement, it appears that her lung parenchymal disease is quite significant.  - May be helpful at this point to get pulmonary consultation as she is still requiring a lot of oxygen after diuresis.  2. RV failure/cor pulmonale: Echo showed EF 60-65%, D-shaped septum, moderate RV enlargement with severely decreased RV systolic function, IVC dilated. RHC with low PCWP, elevated right-sided filling pressures, mild PAH, preserved cardiac output. Suspect RV failure due to parenchymal lung disease (emphysema, smoking-related ILD noted on HRCT 4/23).  She appears well-diuresed at this point with weight significantly down. Creatinine stable.  - Start torsemide 20 mg daily.   - Continue spironolactone 12.5 daily.  - Add Farxiga 10 mg daily.   - Think we can decrease midodrine back to 5 mg tid.  3. 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  HRCT 4/23 showed emphysema and smoking-related ILD.  V/Q scan did not show evidence for PE.  - Treatment will be diuresis, oxygen.  4. COPD: Emphysema on chest imaging.  Active smoker at admission.  - Needs to quit smoking.  5. Atrial fibrillation: Paroxysmal.  AF with RVR noted  overnight but now back in NSR.  - Continue  Eliquis.  Co-Pay $45.00. She will need 30 day card.  6. AKI: Creatinine 2.6 at admission, down to 1.16 today. Follow closely, suspect cardiorenal syndrome.  7. Cirrhosis: Possible cirrhosis by RUQ [place].  This may be due to RV failure.  8. 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  9. Chest pain: Last night, now resolved.  No known CAD but history of smoking. HS-TnI mildly elevated with no trend at admission but thought to be demand ischemia from hypoxemia and volume overload.   - Will get echo.  - Check troponin.    Length of Stay: 5  Loralie Champagne, MD  07/28/2022, 11:07 AM  Advanced Heart Failure Team Pager [phone] (M-F; 7a - 5p)  Please contact [facility] for night-coverage after hours (5p -7a ) and weekends on [email or web address]

## 2022-07-28 NOTE — Plan of Care (Signed)
  Problem: Coping: Goal: Ability to adjust to condition or change in health will improve Outcome: Progressing   Problem: Health Behavior/Discharge Planning: Goal: Ability to identify and utilize available resources and services will improve Outcome: Progressing Goal: Ability to manage health-related needs will improve Outcome: Progressing   Problem: Skin Integrity: Goal: Risk for impaired skin integrity will decrease Outcome: Progressing   Problem: Tissue Perfusion: Goal: Adequacy of tissue perfusion will improve Outcome: Progressing

## 2022-07-28 NOTE — Progress Notes (Signed)
Patient foley removed with no complications

## 2022-07-29 DIAGNOSIS — I5033 Acute on chronic diastolic (congestive) heart failure: Secondary | ICD-10-CM | POA: Diagnosis not present

## 2022-07-29 LAB — BASIC METABOLIC PANEL
Anion gap: 11 (ref 5–15)
BUN: 31 mg/dL — ABNORMAL HIGH (ref 8–23)
CO2: 29 mmol/L (ref 22–32)
Calcium: 9.1 mg/dL (ref 8.9–10.3)
Chloride: 98 mmol/L (ref 98–111)
Creatinine, Ser: 1.36 mg/dL — ABNORMAL HIGH (ref 0.44–1.00)
GFR, Estimated: 44 mL/min — ABNORMAL LOW (ref >60)
Glucose, Bld: 152 mg/dL — ABNORMAL HIGH (ref 70–99)
Potassium: 4.3 mmol/L (ref 3.5–5.1)
Sodium: 138 mmol/L (ref 135–145)

## 2022-07-29 LAB — GLUCOSE, CAPILLARY
Glucose-Capillary: 133 mg/dL — ABNORMAL HIGH (ref 70–99)
Glucose-Capillary: 111 mg/dL — ABNORMAL HIGH (ref 70–99)
Glucose-Capillary: 115 mg/dL — ABNORMAL HIGH (ref 70–99)
Glucose-Capillary: 228 mg/dL — ABNORMAL HIGH (ref 70–99)

## 2022-07-29 LAB — CBC
HCT: 37.4 % (ref 36.0–46.0)
Hemoglobin: 11.6 g/dL — ABNORMAL LOW (ref 12.0–15.0)
MCH: 27.3 pg (ref 26.0–34.0)
MCHC: 31 g/dL (ref 30.0–36.0)
MCV: 88 fL (ref 80.0–100.0)
Platelets: 280 10*3/uL (ref 150–400)
RBC: 4.25 MIL/uL (ref 3.87–5.11)
RDW: 19 % — ABNORMAL HIGH (ref 11.5–15.5)
WBC: 9 10*3/uL (ref 4.0–10.5)
nRBC: 0 % (ref 0.0–0.2)

## 2022-07-29 MED ORDER — TORSEMIDE 20 MG PO TABS
20.0000 mg | ORAL_TABLET | Freq: Every day | ORAL | Status: DC
  Administered 2022-07-30 – 2022-08-04 (×6): 20 mg via ORAL
  Filled 2022-07-29 (×6): qty 1

## 2022-07-29 MED ORDER — METHYLPREDNISOLONE SODIUM SUCC 125 MG IJ SOLR
125.0000 mg | Freq: Every day | INTRAMUSCULAR | Status: DC
  Administered 2022-07-29 – 2022-08-02 (×5): 125 mg via INTRAVENOUS
  Filled 2022-07-29 (×5): qty 2

## 2022-07-29 MED ORDER — CLONAZEPAM 0.25 MG PO TBDP
0.2500 mg | ORAL_TABLET | Freq: Two times a day (BID) | ORAL | Status: DC
  Administered 2022-07-29 – 2022-08-04 (×12): 0.25 mg via ORAL
  Filled 2022-07-29 (×12): qty 1

## 2022-07-29 MED ORDER — TORSEMIDE 20 MG PO TABS: 20.0000 mg | ORAL_TABLET | Freq: Once | ORAL | Status: DC

## 2022-07-29 MED ORDER — TORSEMIDE 20 MG PO TABS: 40.0000 mg | ORAL_TABLET | Freq: Every day | ORAL | Status: DC

## 2022-07-29 NOTE — Progress Notes (Signed)
NAME:  [name], MRN:  [id number], DOB:  1960/09/23, LOS: 6 ADMISSION DATE:  07/23/2022, CONSULTATION DATE: 07/28/2022 REFERRING MD: [facility], CHIEF COMPLAINT: Refractory hypoxia  History of Present Illness:  61 year old lifelong smoker who was seen by our practice in 2014 with recommendations to quit smoking following surgery of the abdomen.  He now presents after being noncompliant with medications and not seeking medical help until she was extremely edematous and short of breath.  She has been aggressively diuresed to 12 L and reports she is breathing better.  Her weight on admission was 228 pounds and her weight today 07/28/2022 203 pounds.  She has been aggressively diuresed with improvement.  CT scan does show groundglass opacities.  2D echo reveals RV dysfunction status post right heart cath with RV dysfunction.  Pulmonary critical care asked to evaluate groundglass disease for aggressive diuresis questionable role for steroids to follow.  Pertinent  Medical History   Past Medical History:  Diagnosis Date   Acid reflux disease    Anxiety    Asthma    Bronchitis, chronic (HCC)    Effects worse with URI   Chronic pain syndrome    COPD (chronic obstructive pulmonary disease) ([place] [place])    Depression    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, type 2   Diverticulitis    Dysplasia of cervix (uteri)    patient states she had dysplasia of the uterus stage 4   Endometrial cancer (HCC)    Fibromyalgia    Fibromyalgia    GERD (gastroesophageal reflux disease)    Headache(784.0)    migraine headache   Hyperlipidemia    Hypertension    Kidney stones    at least 6 stones in past   Pneumonia 2019   Restless leg syndrome    Bilateral   Vomiting    last night at 2300 after last round of antibiotic     Significant Hospital Events: Including procedures, antibiotic start and stop dates in addition to other pertinent events     Interim History / Subjective:   Reports feeling no better  Objective   Blood pressure 92/66, pulse 84, temperature 98.2 F (36.8 C), temperature source Oral, resp. rate 20, height _0  (1.676 m), weight 93.2 kg, last menstrual period 04/20/2013, SpO2 93 %.    FiO2 (%):  [50 %] 50 %   Intake/Output Summary (Last 24 hours) at 07/29/2022 1011 Last data filed at 07/29/2022 0900 Gross per 24 hour  Intake 716 ml  Output 300 ml  Net 416 ml   Filed Weights   07/27/22 0400 07/28/22 0453 07/29/22 0455  Weight: 96.2 kg 92.3 kg 93.2 kg    Examination: General: Morbidly obese female in no acute distress, reports feeling no better today HEENT: MM pink/moist no JVD is appreciated Neuro: Flat affect but intact CV: Heart sounds are distant  PULM: Diminished throughout GI: soft, bsx4 active obese GU: Wick with amber urine Extremities: warm/dry, negative edema  Skin: no rashes or lesions    Resolved Hospital Problem list    95 18 Assessment & Plan:  Acute on chronic hypoxic respiratory failure in the setting of continued tobacco abuse, RV dysfunction, noncompliance with extreme volume overload and still requiring 12 L nasal cannula despite 12 L negative intake and output.  CT scan with groundglass opacities bilateral.  Questionable component of pneumonia.  2D echo with RV dysfunction.  Noted to have ESR of 95 and CRP of 18 therefore trial of steroids may  be helpful She has been aggressively diuresed and may be becoming dry at this point O2 is down to 12 L/min Continue positive pressure ventilation nocturnally Serial chest x-rays Smoking cessation Follow-up with pulmonary as an outpatient Unfortunately her lung disease very becoming near end-stage in nature   Diastolic dysfunction Per cardiology  Noncompliance Counseling  Tobacco abuse Low-dose nicotine patch  Morbid obesity Weight loss  Best Practice (right click and "Reselect all SmartList Selections" daily)   Diet/type: Regular consistency (see  orders) DVT prophylaxis: DOAC GI prophylaxis: PPI Lines: N/A Foley:  N/A Code Status:  full code Last date of multidisciplinary goals of care discussion [tbd]  Labs   CBC: Recent Labs  Lab 07/23/22 0952 07/23/22 1423 07/26/22 1633 07/27/22 0059 07/28/22 0121 07/29/22 0055  WBC 13.0*  --   --  5.9 8.8 9.0  NEUTROABS 10.5*  --   --   --   --   --   HGB 11.0* 11.6* 12.2  11.9* 10.7* 11.6* 11.6*  HCT 36.0 34.0* 36.0  35.0* 35.0* 35.7* 37.4  MCV 91.4  --   --  89.3 87.3 88.0  PLT 230  --   --  273 247 937    Basic Metabolic Panel: Recent Labs  Lab 07/25/22 0048 07/26/22 0032 07/26/22 1633 07/27/22 0059 07/28/22 0121 07/29/22 0055  NA 140 139 140  141 139 136 138  K 3.8 3.4* 4.2  4.2 4.2 4.2 4.3  CL 103 101  --  98 97* 98  CO2 21* 27  --  _0 GLUCOSE 125* 131*  --  117* 136* 152*  BUN 58* 45*  --  36* 34* 31*  CREATININE 1.69* 1.46*  --  1.16* 1.16* 1.36*  CALCIUM 8.3* 8.5*  --  8.9 8.9 9.1  MG  --   --   --  1.8  --   --    GFR: Estimated Creatinine Clearance: 48.7 mL/min (A) (by C-G formula based on SCr of 1.36 mg/dL (H)). Recent Labs  Lab 07/23/22 0952 07/27/22 0059 07/28/22 0121 07/29/22 0055  WBC 13.0* 5.9 8.8 9.0    Liver Function Tests: Recent Labs  Lab 07/23/22 0952 07/24/22 0107  AST 51* 38  ALT 12 9  ALKPHOS 157* 141*  BILITOT 1.1 1.9*  PROT 6.5 6.6  ALBUMIN 2.0* 2.4*   No results for input(s): "LIPASE", "AMYLASE" in the last 168 hours. Recent Labs  Lab 07/24/22 0059  AMMONIA 25    ABG    Component Value Date/Time   PHART 7.44 07/24/2022 0529   PCO2ART 35 07/24/2022 0529   PO2ART 119 (H) 07/24/2022 0529   HCO3 30.8 (H) 07/26/2022 1633   HCO3 31.0 (H) 07/26/2022 1633   TCO2 32 07/26/2022 1633   TCO2 32 07/26/2022 1633   ACIDBASEDEF 0.1 07/24/2022 0529   O2SAT 53 07/26/2022 1633   O2SAT 51 07/26/2022 1633     Coagulation Profile: Recent Labs  Lab 07/23/22 1255 07/24/22 0107  INR 1.4* 1.4*    Cardiac  Enzymes: No results for input(s): "CKTOTAL", "CKMB", "CKMBINDEX", "TROPONINI" in the last 168 hours.  HbA1C: Hgb A1c MFr Bld  Date/Time Value Ref Range Status  07/23/2022 12:55 PM 6.2 (H) 4.8 - 5.6 % Final    Comment:    (NOTE) Pre diabetes:          5.7%-6.4%  Diabetes:              >6.4%  Glycemic control for   <7.0% adults  with diabetes   05/18/2019 11:31 AM 6.8 (H) 4.8 - 5.6 % Final    Comment:    (NOTE) Pre diabetes:          5.7%-6.4% Diabetes:              >6.4% Glycemic control for   <7.0% adults with diabetes     CBG: Recent Labs  Lab 07/28/22 0542 07/28/22 1111 07/28/22 1750 07/28/22 2144 07/29/22 0606  GLUCAP 130* 118* 128* 151* 133*      Critical care time: [name] ACNP Acute Care Nurse Practitioner [place] Please consult Amion 07/29/2022, 10:11 AM

## 2022-07-29 NOTE — Progress Notes (Addendum)
PROGRESS NOTE    [name]  GEZ:[id number] DOB: 03/13/1961 DOA: 07/23/2022 PCP: Ginger Organ., MD  61 y/o female active smoker w/ h/o COPD, HTN, HLD, Type 2DM and chronic pain syndrome/ fibromyalgia, who presented to ED on 11/3 w/ complains of 3 wk h/o progressive SOB, cough and wheezing,   In ED, found to be in respiratory distress and tachypneic, BNP 905. Creat 2.6 CXR w/ mild diffuse interstitial densities bilaterally c/w pulmonary edema vs possible atypical inflammation.  2D Echo showed severe RV failure.  LVEF 60-65%, GIDD, severely reduced RV systolic function -Diuresed with IV Lasix, advanced heart failure team following, still remains profoundly hypoxic -HRCT 11/7: New mid and upper lung groundglass opacities favored to represent CHF, mediastinal adenopathy noted as well -11/8: Pulmonary consulted  Subjective: -Does not feel too good today, breathing is unchanged, has a dry cough, no wheezing  Assessment and Plan:  Acute Hypoxic resp failure -multifactorial, CHF, with underlying COPD -VQ scan negative for PE -diuresed with IV lasix, -12 L, oxygen requirement remains high-unchanged -HRCT 4/23, concerning for emphysema and interstitial fibrosis, still smoking -Repeat CT chest 11/7 with new upper and middle lobe groundglass opacities favored to represent CHF, but wedge pressure is down and appears adequately diuresed, neg 12L, po torsemide  -Resp viral panel, flu and Covid neg -appreciate pulmonary input, ESR and CRP is high, HIV screen pos too, but confirmatory test is pending -Continue nebs, incentive spirometry, out of bed to chair   Acute on chronic diastolic CHF RV failure, Cor Pulmonale Pulm HTN -Echo preserved LV systolic function EF 60 to 65%,, RV systolic function with severe reduction, -RHC 11/6 showed R>>L heart failure, c low wedge and elevated right heart pressures suggesting cor pulmonale -diuresed w/ IV lasix, 12 L neg, now on oral torsemide,  Aldactone, Jardiance -Heart failure team following -remains on midodrine   Acute exacerbation of COPD with asthma ([place]) -on bronchodilator therapy   P.Afib -in NSR, continue eliquis   AKI  Hypokalemia  -creat 2.6 on admission, cardiorenal, improving now 1.1   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    Acid reflux disease Continue acid suppressive therapy.    Type 2 diabetes mellitus with hyperlipidemia (HCC) -CBGs stable   Fibromyalgia Depression,  Continue with aripiprazole, clonazepam    Class 2 obesity Calculated BMI is 36.3      DVT prophylaxis:apixaban Code Status: FUll Code Family Communication: None present Disposition: Home pending improvement in hypoxia   Antimicrobials:    Objective: Vitals:   07/29/22 0455 07/29/22 0500 07/29/22 0730 07/29/22 0903  BP: (!) 98/47 (!) 94/50 92/66   Pulse: 93  84   Resp: 20  20   Temp: 97.9 F (36.6 C)  98.2 F (36.8 C)   TempSrc:   Oral   SpO2: 98%  97% 93%  Weight: 93.2 kg     Height:        Intake/Output Summary (Last 24 hours) at 07/29/2022 1152 Last data filed at 07/29/2022 1048 Gross per 24 hour  Intake 716 ml  Output 800 ml  Net -84 ml   Filed Weights   07/27/22 0400 07/28/22 0453 07/29/22 0455  Weight: 96.2 kg 92.3 kg 93.2 kg  Examination:  General exam: Appears calm and comfortable  HEENT: Neck obese unable to assess JVD Respiratory system: Few bilateral Rales Cardiovascular system: S1 & S2 heard, RRR.  Abd: nondistended, soft and nontender.Normal bowel sounds heard. Central nervous system: Alert and oriented. No focal neurological deficits. Extremities: no edema Skin: No rashes Psychiatry:  Mood &  affect appropriate.     Data Reviewed:   CBC: Recent Labs  Lab 07/23/22 0952 07/23/22 1423 07/26/22 1633 07/27/22 0059 07/28/22 0121 07/29/22 0055  WBC 13.0*  --   --  5.9 8.8 9.0  NEUTROABS 10.5*  --   --   --   --   --   HGB 11.0* 11.6* 12.2  11.9* 10.7* 11.6* 11.6*  HCT 36.0 34.0* 36.0  35.0* 35.0* 35.7* 37.4  MCV 91.4  --   --  89.3 87.3 88.0  PLT 230  --   --  273 247 174   Basic Metabolic Panel: Recent Labs  Lab 07/25/22 0048 07/26/22 0032 07/26/22 1633 07/27/22 0059 07/28/22 0121 07/29/22 0055  NA 140 139 140  141 139 136 138  K 3.8 3.4* 4.2  4.2 4.2 4.2 4.3  CL 103 101  --  98 97* 98  CO2 21* 27  --  _0 GLUCOSE 125* 131*  --  117* 136* 152*  BUN 58* 45*  --  36* 34* 31*  CREATININE 1.69* 1.46*  --  1.16* 1.16* 1.36*  CALCIUM 8.3* 8.5*  --  8.9 8.9 9.1  MG  --   --   --  1.8  --   --    GFR: Estimated Creatinine Clearance: 48.7 mL/min (A) (by C-G formula based on SCr of 1.36 mg/dL (H)). Liver Function Tests: Recent Labs  Lab 07/23/22 0952 07/24/22 0107  AST 51* 38  ALT 12 9  ALKPHOS 157* 141*  BILITOT 1.1 1.9*  PROT 6.5 6.6  ALBUMIN 2.0* 2.4*   No results for input(s): "LIPASE", "AMYLASE" in the last 168 hours. Recent Labs  Lab 07/24/22 0059  AMMONIA 25   Coagulation Profile: Recent Labs  Lab 07/23/22 1255 07/24/22 0107  INR 1.4* 1.4*   Cardiac Enzymes: No results for input(s): "CKTOTAL", "CKMB", "CKMBINDEX", "TROPONINI" in the last 168 hours. BNP (last 3 results) No results for input(s): "PROBNP" in the last 8760 hours. HbA1C: No results for input(s): "HGBA1C" in the last 72 hours. CBG: Recent Labs  Lab 07/28/22 1111 07/28/22 1750 07/28/22 2144 07/29/22 0606 07/29/22 1136  GLUCAP 118* 128* 151* 133* 111*   Lipid Profile: No results for input(s): "CHOL", "HDL", "LDLCALC", "TRIG", "CHOLHDL", "LDLDIRECT" in the last 72 hours. Thyroid Function Tests: No results for input(s): "TSH", "T4TOTAL", "FREET4", "T3FREE",  "THYROIDAB" in the last 72 hours. Anemia Panel: No results for input(s): "VITAMINB12", "FOLATE", "FERRITIN", "TIBC", "IRON", "RETICCTPCT" in the last 72 hours. Urine analysis:    Component Value Date/Time   COLORURINE YELLOW 11/22/2019 1351   APPEARANCEUR CLEAR 11/22/2019 1351   LABSPEC 1.008 11/22/2019 1351   PHURINE 5.0 11/22/2019 1351   GLUCOSEU NEGATIVE 11/22/2019 1351   HGBUR SMALL (A) 11/22/2019 1351   BILIRUBINUR NEGATIVE 11/22/2019 1351   KETONESUR NEGATIVE 11/22/2019 1351   PROTEINUR NEGATIVE 11/22/2019 1351   UROBILINOGEN 0.2 11/02/2012 1530   NITRITE NEGATIVE 11/22/2019 1351   LEUKOCYTESUR NEGATIVE 11/22/2019 1351   Sepsis Labs: _1 (procalcitonin:4,lacticidven:4)  ) Recent Results (from the past 240 hour(s))  Resp Panel by RT-PCR (Flu A&B, Covid) Anterior Nasal Swab     Status: None  Collection Time: 07/23/22  9:52 AM   Specimen: Anterior Nasal Swab  Result Value Ref Range Status   SARS Coronavirus 2 by RT PCR NEGATIVE NEGATIVE Final    Comment: (NOTE) SARS-CoV-2 target nucleic acids are NOT DETECTED.  The SARS-CoV-2 RNA is generally detectable in upper respiratory specimens during the acute phase of infection. The lowest concentration of SARS-CoV-2 viral copies this assay can detect is 138 copies/mL. A negative result does not preclude SARS-Cov-2 infection and should not be used as the sole basis for treatment or other patient management decisions. A negative result may occur with  improper specimen collection/handling, submission of specimen other than nasopharyngeal swab, presence of viral mutation(s) within the areas targeted by this assay, and inadequate number of viral copies(<138 copies/mL). A negative result must be combined with clinical observations, patient history, and epidemiological information. The expected result is Negative.  Fact Sheet for Patients:  https://www.fda.gov/media/152166/download  Fact Sheet for Healthcare Providers:   https://www.fda.gov/media/152162/download  This test is no t yet approved or cleared by the [place] FDA and  has been authorized for detection and/or diagnosis of SARS-CoV-2 by FDA under an Emergency Use Authorization (EUA). This EUA will remain  in effect (meaning this test can be used) for the duration of the COVID-19 declaration under Section 564(b)(1) of the Act, 21 U.S.C.section 360bbb-3(b)(1), unless the authorization is terminated  or revoked sooner.       Influenza A by PCR NEGATIVE NEGATIVE Final   Influenza B by PCR NEGATIVE NEGATIVE Final    Comment: (NOTE) The Xpert Xpress SARS-CoV-2/FLU/RSV plus assay is intended as an aid in the diagnosis of influenza from Nasopharyngeal swab specimens and should not be used as a sole basis for treatment. Nasal washings and aspirates are unacceptable for Xpert Xpress SARS-CoV-2/FLU/RSV testing.  Fact Sheet for Patients: https://www.fda.gov/media/152166/download  Fact Sheet for Healthcare Providers: https://www.fda.gov/media/152162/download  This test is not yet approved or cleared by the [place] FDA and has been authorized for detection and/or diagnosis of SARS-CoV-2 by FDA under an Emergency Use Authorization (EUA). This EUA will remain in effect (meaning this test can be used) for the duration of the COVID-19 declaration under Section 564(b)(1) of the Act, 21 U.S.C. section 360bbb-3(b)(1), unless the authorization is terminated or revoked.  Performed at [facility], [address] [place], [place] [address]   Respiratory (~20 pathogens) panel by PCR     Status: None   Collection Time: 07/25/22  6:15 PM   Specimen: Nasopharyngeal Swab; Respiratory  Result Value Ref Range Status   Adenovirus NOT DETECTED NOT DETECTED Final   Coronavirus 229E NOT DETECTED NOT DETECTED Final    Comment: (NOTE) The Coronavirus on the Respiratory Panel, DOES NOT test for the novel  Coronavirus (2019 nCoV)    Coronavirus  HKU1 NOT DETECTED NOT DETECTED Final   Coronavirus NL63 NOT DETECTED NOT DETECTED Final   Coronavirus OC43 NOT DETECTED NOT DETECTED Final   Metapneumovirus NOT DETECTED NOT DETECTED Final   Rhinovirus / Enterovirus NOT DETECTED NOT DETECTED Final   Influenza A NOT DETECTED NOT DETECTED Final   Influenza B NOT DETECTED NOT DETECTED Final   Parainfluenza Virus 1 NOT DETECTED NOT DETECTED Final   Parainfluenza Virus 2 NOT DETECTED NOT DETECTED Final   Parainfluenza Virus 3 NOT DETECTED NOT DETECTED Final   Parainfluenza Virus 4 NOT DETECTED NOT DETECTED Final   Respiratory Syncytial Virus NOT DETECTED NOT DETECTED Final   Bordetella pertussis NOT DETECTED NOT DETECTED Final     Bordetella Parapertussis NOT DETECTED NOT DETECTED Final   Chlamydophila pneumoniae NOT DETECTED NOT DETECTED Final   Mycoplasma pneumoniae NOT DETECTED NOT DETECTED Final    Comment: Performed at [facility], [place] [address] [place], [place] [address]     Radiology Studies: CT Chest High Resolution  Result Date: 07/28/2022 CLINICAL DATA:  Interstitial lung disease EXAM: CT CHEST WITHOUT CONTRAST TECHNIQUE: Multidetector CT imaging of the chest was performed following the standard protocol without intravenous contrast. High resolution imaging of the lungs, as well as inspiratory and expiratory imaging, was performed. RADIATION DOSE REDUCTION: This exam was performed according to the departmental dose-optimization program which includes automated exposure control, adjustment of the mA and/or kV according to patient size and/or use of iterative reconstruction technique. COMPARISON:  Chest CT dated January 06, 2022 FINDINGS: Cardiovascular: Normal heart size. Trace pericardial effusion. Normal caliber thoracic aorta with moderate calcified plaque. Coronary artery calcifications. Dilated main pulmonary artery, measuring up to 3.7 cm on series 5, image 65. Mediastinum/Nodes: Esophagus and thyroid are unremarkable.  Mediastinal lymph nodes are increased in size when compared with prior exam. Reference right upper paratracheal lymph node measuring 1.8 cm in short axis on series 5, image 46, previously measured 1.1 cm in short axis. Reference subcarinal lymph node measuring 2.0 cm in short axis, previously measured 1.1 cm. Lungs/Pleura: Central airways are patent. No definite air trapping, although expiratory phase images are technically inadequate. Moderate centrilobular emphysema. New diffuse mid and upper lung predominant ground-glass opacities with associated fissural thickening. Previously described reticular opacities are not well visualized due to superimposed acute opacities. No pleural effusion or pneumothorax. Upper Abdomen: No acute abnormality. Musculoskeletal: No chest wall mass or suspicious bone lesions identified. IMPRESSION: 1. New mid and upper lung predominant diffuse ground-glass opacities with associated fissural thickening, findings are favored to be due to pulmonary edema. Infectious or inflammatory etiology are additional considerations. 2. Mediastinal lymph nodes are increased in size when compared with prior exam, likely reactive, but somewhat greater than size than expected. Recommend follow-up chest CT in 3 months to ensure resolution. 3. Dilated main pulmonary artery, findings can be seen in the setting of pulmonary hypertension. 4. Aortic Atherosclerosis (ICD10-I70.0) and Emphysema (ICD10-J43.9). Electronically Signed   By: Yetta Glassman M.D.   On: 07/28/2022 11:13     Scheduled Meds:  apixaban  5 mg Oral BID   ARIPiprazole  5 mg Oral Daily   atorvastatin  40 mg Oral QHS   budesonide (PULMICORT) nebulizer solution  0.25 mg Nebulization BID   Chlorhexidine Gluconate Cloth  6 each Topical Daily   clonazePAM  0.5 mg Oral BID   dapagliflozin propanediol  10 mg Oral Daily   doxepin  100 mg Oral QHS   FLUoxetine  40 mg Oral Daily   insulin aspart  0-9 Units Subcutaneous TID WC    ipratropium-albuterol  3 mL Nebulization TID   midodrine  5 mg Oral TID WC   nicotine  7 mg Transdermal Daily   pantoprazole  40 mg Oral Daily   sodium chloride flush  3 mL Intravenous Q12H   sodium chloride flush  3 mL Intravenous Q12H   spironolactone  12.5 mg Oral Daily   [START ON 07/30/2022] torsemide  20 mg Oral Daily   Continuous Infusions:  sodium chloride       LOS: 6 days    Time spent: 58mn  PDomenic Polite MD [facility]   07/29/2022, 11:52 AM

## 2022-07-29 NOTE — Progress Notes (Addendum)
Patient ID: [name], female   DOB: 1961-09-06, 61 y.o.   MRN: [id number]     Advanced Heart Failure Rounding Note  PCP-Cardiologist: None   Subjective:    Ongoing dyspnea with exertion. Remains on 12-13 liters.   Echo EF 60-65% RV reduced.    RHC Procedural Findings: Hemodynamics (mmHg) RA mean 11 RV 45/15 PA 44/19, mean 28 PCWP mean 4 Oxygen saturations: PA 52% AO 91% Cardiac Output (Fick) 4.69  Cardiac Index (Fick) 2.28 PVR 5.1 WU Cardiac Output (thermo) 5.46 Cardiac Index (thermo) 2.65  PVR 4.4 WU PAPI 2.3   Objective:   Weight Range: 93.2 kg Body mass index is 33.16 kg/m.   Vital Signs:   Temp:  [97.9 F (36.6 C)-98 F (36.7 C)] 97.9 F (36.6 C) (11/09 0455) Pulse Rate:  [71-93] 93 (11/09 0455) Resp:  [10-26] 20 (11/09 0455) BP: (78-106)/(37-64) 94/50 (11/09 0500) SpO2:  [84 %-98 %] 98 % (11/09 0455) FiO2 (%):  [50 %-93 %] 50 % (11/09 0341) Weight:  [93.2 kg] 93.2 kg (11/09 0455) Last BM Date : 07/27/22  Weight change: Filed Weights   07/27/22 0400 07/28/22 0453 07/29/22 0455  Weight: 96.2 kg 92.3 kg 93.2 kg    Intake/Output:   Intake/Output Summary (Last 24 hours) at 07/29/2022 0723 Last data filed at 07/29/2022 0458 Gross per 24 hour  Intake 476 ml  Output 300 ml  Net 176 ml      Physical Exam  General:  In bed. No resp difficulty HEENT: normal Neck: supple. no JVD. Carotids 2+ bilat; no bruits. No lymphadenopathy or thryomegaly appreciated. Cor: PMI nondisplaced. Regular rate & rhythm. No rubs, gallops or murmurs. Lungs: clear on 12 liter hFNC Abdomen: obese, soft, nontender, nondistended. No hepatosplenomegaly. No bruits or masses. Good bowel sounds. Extremities: no cyanosis, clubbing, rash, edema Neuro: alert & orientedx3, cranial nerves grossly intact. moves all 4 extremities w/o difficulty. Affect pleasant  Telemetry  SR 70-80s personally checked.    EKG    N/A  Labs    CBC Recent Labs    07/28/22 0121  07/29/22 0055  WBC 8.8 9.0  HGB 11.6* 11.6*  HCT 35.7* 37.4  MCV 87.3 88.0  PLT 247 702   Basic Metabolic Panel Recent Labs    07/27/22 0059 07/28/22 0121 07/29/22 0055  NA 139 136 138  K 4.2 4.2 4.3  CL 98 97* 98  CO2 _0 GLUCOSE 117* 136* 152*  BUN 36* 34* 31*  CREATININE 1.16* 1.16* 1.36*  CALCIUM 8.9 8.9 9.1  MG 1.8  --   --    Liver Function Tests No results for input(s): "AST", "ALT", "ALKPHOS", "BILITOT", "PROT", "ALBUMIN" in the last 72 hours. No results for input(s): "LIPASE", "AMYLASE" in the last 72 hours. Cardiac Enzymes No results for input(s): "CKTOTAL", "CKMB", "CKMBINDEX", "TROPONINI" in the last 72 hours.  BNP: BNP (last 3 results) Recent Labs    07/23/22 0952 07/28/22 0121  BNP 905.0* 625.8*    ProBNP (last 3 results) No results for input(s): "PROBNP" in the last 8760 hours.   D-Dimer No results for input(s): "DDIMER" in the last 72 hours. Hemoglobin A1C No results for input(s): "HGBA1C" in the last 72 hours. Fasting Lipid Panel No results for input(s): "CHOL", "HDL", "LDLCALC", "TRIG", "CHOLHDL", "LDLDIRECT" in the last 72 hours. Thyroid Function Tests No results for input(s): "TSH", "T4TOTAL", "T3FREE", "THYROIDAB" in the last 72 hours.  Invalid input(s): "FREET3"  Other results:   Imaging    No  results found.   Medications:     Scheduled Medications:  apixaban  5 mg Oral BID   ARIPiprazole  5 mg Oral Daily   atorvastatin  40 mg Oral QHS   budesonide (PULMICORT) nebulizer solution  0.25 mg Nebulization BID   Chlorhexidine Gluconate Cloth  6 each Topical Daily   clonazePAM  0.5 mg Oral BID   dapagliflozin propanediol  10 mg Oral Daily   doxepin  100 mg Oral QHS   FLUoxetine  40 mg Oral Daily   insulin aspart  0-9 Units Subcutaneous TID WC   ipratropium-albuterol  3 mL Nebulization TID   midodrine  5 mg Oral TID WC   nicotine  7 mg Transdermal Daily   pantoprazole  40 mg Oral Daily   sodium chloride flush  3 mL  Intravenous Q12H   sodium chloride flush  3 mL Intravenous Q12H   spironolactone  12.5 mg Oral Daily   torsemide  20 mg Oral Daily    Infusions:  sodium chloride      PRN Medications: sodium chloride, acetaminophen, dicyclomine, metoprolol tartrate, ondansetron (ZOFRAN) IV, sodium chloride flush   Assessment/Plan    1. Acute hypoxemic respiratory failure: Now on 12 L oxygen.  Has COPD, but does not wear oxygen at home. She was smoking up to this admission.  Suspect combination of AECOPD (emphysema, smoking-related ILD) and RV failure/cor pulmonale.  No evidence for PE by V/Q scan. RHC with low PCWP and elevated right heart pressures, suggesting cor pulmonale.  Given that she still has significant oxygen requirement, it appears that her lung parenchymal disease is quite significant.  - Pulmonary appreciated. Sed rate 95, CRP   18 - Consider steroids ? 2. RV failure/cor pulmonale: Echo showed EF 60-65%, D-shaped septum, moderate RV enlargement with severely decreased RV systolic function, IVC dilated. RHC with low PCWP, elevated right-sided filling pressures, mild PAH, preserved cardiac output. Suspect RV failure due to parenchymal lung disease (emphysema, smoking-related ILD noted on HRCT 4/23).  She appears well-diuresed at this point with weight significantly down. Creatinine stable.  - Volume status stable. Continue torsemide 20 mg daily.   - Continue spironolactone 12.5 daily.  - Continue Farxiga 10 mg daily.   - Continue midodrine back to 5 mg tid. - Renal function stable.   3. 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  HRCT 4/23 showed emphysema and smoking-related ILD.  V/Q scan did not show evidence for PE.  - Treatment will be diuresis, oxygen.  4. COPD: Emphysema on chest imaging.  Active smoker at admission.  - Needs to quit smoking.  5. Atrial fibrillation: Paroxysmal.   -In SR.   - Continue  Eliquis.  Co-Pay $45.00. She will need 30 day  card.  6. AKI: Creatinine 2.6 at admission, down to 1.36 today. Follow closely, suspect cardiorenal syndrome.  7. Cirrhosis: Possible cirrhosis by RUQ [place].  This may be due to RV failure.  8. 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  9. Chest pain: Last night, now resolved.  No known CAD but history of smoking. HS-TnI mildly elevated with no trend at admission but thought to be demand ischemia from hypoxemia and volume overload.   - EF 60-65% RV severely reduced.  No PE on VQ  scan 11/6 - HS Trop no trend     Length of Stay: [place], NP  07/29/2022, 7:23 AM  Advanced Heart Failure Team Pager [phone] (M-F; 7a - 5p)  Please contact [facility] for night-coverage after hours (5p -7a ) and weekends on [email or web address]  Patient seen with NP, agree with the above note.   She is still on 12L/min oxygen.  CT chest yesterday showed upper lobe GGO, ?pulmonary edema versus infectious/inflammatory.  ESR elevated at 95 and CRP 18.    Creatinine higher at 1.36.   General: NAD Neck: JVP 7-8 cm, no thyromegaly or thyroid nodule.  Lungs: Crackles at bases.  CV: Nondisplaced PMI.  Heart regular S1/S2, no S3/S4, no murmur.  No peripheral edema.   Abdomen: Soft, nontender, no hepatosplenomegaly, no distention.  Skin: Intact without lesions or rashes.  Neurologic: Alert and oriented x 3.  Psych: Normal affect. Extremities: No clubbing or cyanosis.  HEENT: Normal.   Still on 12 L/min oxygen. I do not think that aggressive diuresis is going to help much, she does not appear volume overloaded at this point and RHC showed low PCWP.  Creatinine also rising again at 1.36. ?Inflammatory lung disease/ILD process with very elevated ESR and CRP.  - Continue torsemide 20 mg daily, spironolactone 12.5 daily, Farxiga 10 daily.  - Consider steroids for inflammatory lung process, pulmonary following.   Loralie Champagne 07/29/2022 12:44 PM

## 2022-07-30 DIAGNOSIS — I5033 Acute on chronic diastolic (congestive) heart failure: Secondary | ICD-10-CM | POA: Diagnosis not present

## 2022-07-30 LAB — HIV-1/2 AB - DIFFERENTIATION
HIV 1 Ab: NONREACTIVE
HIV 2 Ab: NONREACTIVE
Note: NEGATIVE

## 2022-07-30 LAB — GLUCOSE, CAPILLARY
Glucose-Capillary: 185 mg/dL — ABNORMAL HIGH (ref 70–99)
Glucose-Capillary: 177 mg/dL — ABNORMAL HIGH (ref 70–99)
Glucose-Capillary: 274 mg/dL — ABNORMAL HIGH (ref 70–99)
Glucose-Capillary: 251 mg/dL — ABNORMAL HIGH (ref 70–99)
Glucose-Capillary: 265 mg/dL — ABNORMAL HIGH (ref 70–99)

## 2022-07-30 LAB — CBC
HCT: 39.2 % (ref 36.0–46.0)
Hemoglobin: 12 g/dL (ref 12.0–15.0)
MCH: 27.4 pg (ref 26.0–34.0)
MCHC: 30.6 g/dL (ref 30.0–36.0)
MCV: 89.5 fL (ref 80.0–100.0)
Platelets: 303 10*3/uL (ref 150–400)
RBC: 4.38 MIL/uL (ref 3.87–5.11)
RDW: 18.5 % — ABNORMAL HIGH (ref 11.5–15.5)
WBC: 7.3 10*3/uL (ref 4.0–10.5)
nRBC: 0 % (ref 0.0–0.2)

## 2022-07-30 LAB — BASIC METABOLIC PANEL
Anion gap: 11 (ref 5–15)
BUN: 29 mg/dL — ABNORMAL HIGH (ref 8–23)
CO2: 29 mmol/L (ref 22–32)
Calcium: 9.3 mg/dL (ref 8.9–10.3)
Chloride: 96 mmol/L — ABNORMAL LOW (ref 98–111)
Creatinine, Ser: 1.11 mg/dL — ABNORMAL HIGH (ref 0.44–1.00)
GFR, Estimated: 56 mL/min — ABNORMAL LOW (ref >60)
Glucose, Bld: 214 mg/dL — ABNORMAL HIGH (ref 70–99)
Potassium: 4.9 mmol/L (ref 3.5–5.1)
Sodium: 136 mmol/L (ref 135–145)

## 2022-07-30 LAB — HIV-1/HIV-2 QUALITATIVE RNA
Final Interpretation: NEGATIVE
HIV-1 RNA, Qualitative: NONREACTIVE
HIV-2 RNA, Qualitative: NONREACTIVE

## 2022-07-30 MED ORDER — INSULIN DETEMIR 100 UNIT/ML ~~LOC~~ SOLN
10.0000 [IU] | Freq: Every day | SUBCUTANEOUS | Status: DC
  Administered 2022-07-30: 10 [IU] via SUBCUTANEOUS
  Filled 2022-07-30 (×2): qty 0.1

## 2022-07-30 MED ORDER — NYSTATIN 100000 UNIT/ML MT SUSP
5.0000 mL | Freq: Four times a day (QID) | OROMUCOSAL | Status: DC
  Administered 2022-07-30 – 2022-08-04 (×21): 500000 [IU] via ORAL
  Filled 2022-07-30 (×21): qty 5

## 2022-07-30 MED ORDER — ORAL CARE MOUTH RINSE: 15.0000 mL | OROMUCOSAL | Status: DC | PRN

## 2022-07-30 MED ORDER — ORAL CARE MOUTH RINSE
15.0000 mL | OROMUCOSAL | Status: DC
  Administered 2022-07-30 – 2022-08-03 (×14): 15 mL via OROMUCOSAL

## 2022-07-30 NOTE — Progress Notes (Signed)
PROGRESS NOTE    [name]  JJO:[id number] DOB: 02-25-1961 DOA: 07/23/2022 PCP: Ginger Organ., MD  61 y/o female active smoker w/ h/o COPD, HTN, HLD, Type 2DM and chronic pain syndrome/ fibromyalgia, who presented to ED on 11/3 w/ complains of 3 wk h/o progressive SOB, cough and wheezing,   In ED, found to be in respiratory distress and tachypneic, BNP 905. Creat 2.6 CXR w/ mild diffuse interstitial densities bilaterally c/w pulmonary edema vs possible atypical inflammation.  2D Echo showed severe RV failure.  LVEF 60-65%, GIDD, severely reduced RV systolic function -Diuresed with IV Lasix, advanced heart failure team following, still remains profoundly hypoxic -HRCT 11/7: New mid and upper lung groundglass opacities favored to represent CHF, mediastinal adenopathy noted as well -11/8: Pulmonary consulted -11/9 started pulse IV steroids  Subjective: -Feels better after starting steroids, breathing largely unchanged, no wheezing  Assessment and Plan:  Acute Hypoxic resp failure -multifactorial, CHF, with underlying COPD -VQ scan negative for PE -diuresed with IV lasix, -12 L, oxygen requirement remains high-unchanged -HRCT 4/23, concerning for emphysema and interstitial fibrosis, still smoking -Repeat CT chest 11/7 with new upper and middle lobe groundglass opacities favored to represent CHF, but wedge pressure is down and appears adequately diuresed, neg 13.2 L, po torsemide  -Resp viral panel, flu and Covid neg -appreciate pulmonary input, ESR and CRP is high, HIV screen pos too, but confirmatory test is still pending (send out to [place]) -Started high-dose pulse steroids 11/9 -Continue nebs, incentive spirometry, out of bed to chair   Acute on chronic diastolic CHF RV failure, Cor Pulmonale Pulm HTN -Echo preserved LV systolic function EF 60 to 16%,, RV systolic function with severe reduction, -RHC 11/6 showed R>>L heart failure, c low wedge and elevated right  heart pressures suggesting cor pulmonale -diuresed w/ IV lasix, 13.2 L neg, now on oral torsemide, Aldactone, Jardiance -Heart failure team following -remains on midodrine   Acute exacerbation of COPD with asthma ([place]) -on bronchodilator therapy   P.Afib -in NSR, continue eliquis   AKI  Hypokalemia  -creat 2.6 on admission, cardiorenal, improving now 1.1   Cirrhosis -suspected on RUQ US-11/3 -could be from RV failure, check hepatitis C   HIV positive screen -HIV viral RNA not detected, confirmatory test pending -Called lab today, confirmatory testing is send out test through [place], could take 3 to 5 days   Essential hypertension Continue blood pressure support with midodrine.    Acid reflux disease Continue acid suppressive therapy.    Type 2 diabetes mellitus with hyperlipidemia (HCC) -CBGs stable   Fibromyalgia Depression,  Continue with aripiprazole, clonazepam    Class 2 obesity Calculated BMI is 36.3      DVT prophylaxis:apixaban Code Status: FUll Code Family Communication: None present Disposition: Home pending improvement in hypoxia   Consultants: Cards and pulmonary  Objective: Vitals:   07/30/22 0426 07/30/22 0829 07/30/22 0843 07/30/22 1154  BP: 98/64  (!) 119/47 (!) 110/55  Pulse: 83  100 90  Resp: (!) 21   20  Temp: 97.9 F (36.6 C)  97.9 F (36.6 C) 97.8 F (36.6 C)  TempSrc: Oral  Oral Oral  SpO2: 96% 94% 93% 97%  Weight: 91 kg     Height:        Intake/Output Summary (Last 24 hours) at 07/30/2022 1204 Last data filed at 07/30/2022 0849 Gross per 24 hour  Intake 240 ml  Output 1150 ml  Net -910 ml   [employer]  07/28/22 0453 07/29/22 0455 07/30/22 0426  Weight: 92.3 kg 93.2 kg 91 kg    Examination:  General exam: Obese chronically ill female sitting up in bed, AAOx3, no distress HEENT: Neck obese unable to assess JVD CVS: S1-S2, regular rhythm Lungs: Few scattered rales Abdomen: Soft, nontender, bowel sounds  present Extremities: No edema  Skin: No rashes Psychiatry:  Mood & affect appropriate.     Data Reviewed:   CBC: Recent Labs  Lab 07/26/22 1633 07/27/22 0059 07/28/22 0121 07/29/22 0055 07/30/22 0038  WBC  --  5.9 8.8 9.0 7.3  HGB 12.2  11.9* 10.7* 11.6* 11.6* 12.0  HCT 36.0  35.0* 35.0* 35.7* 37.4 39.2  MCV  --  89.3 87.3 88.0 89.5  PLT  --  273 247 280 509   Basic Metabolic Panel: Recent Labs  Lab 07/26/22 0032 07/26/22 1633 07/27/22 0059 07/28/22 0121 07/29/22 0055 07/30/22 0038  NA 139 140  141 139 136 138 136  K 3.4* 4.2  4.2 4.2 4.2 4.3 4.9  CL 101  --  98 97* 98 96*  CO2 27  --  _0 GLUCOSE 131*  --  117* 136* 152* 214*  BUN 45*  --  36* 34* 31* 29*  CREATININE 1.46*  --  1.16* 1.16* 1.36* 1.11*  CALCIUM 8.5*  --  8.9 8.9 9.1 9.3  MG  --   --  1.8  --   --   --    GFR: Estimated Creatinine Clearance: 59 mL/min (A) (by C-G formula based on SCr of 1.11 mg/dL (H)). Liver Function Tests: Recent Labs  Lab 07/24/22 0107  AST 38  ALT 9  ALKPHOS 141*  BILITOT 1.9*  PROT 6.6  ALBUMIN 2.4*   No results for input(s): "LIPASE", "AMYLASE" in the last 168 hours. Recent Labs  Lab 07/24/22 0059  AMMONIA 25   Coagulation Profile: Recent Labs  Lab 07/24/22 0107  INR 1.4*   Cardiac Enzymes: No results for input(s): "CKTOTAL", "CKMB", "CKMBINDEX", "TROPONINI" in the last 168 hours. BNP (last 3 results) No results for input(s): "PROBNP" in the last 8760 hours. HbA1C: No results for input(s): "HGBA1C" in the last 72 hours. CBG: Recent Labs  Lab 07/29/22 1136 07/29/22 1606 07/29/22 2127 07/30/22 0646 07/30/22 1122  GLUCAP 111* 115* 228* 185* 177*   Lipid Profile: No results for input(s): "CHOL", "HDL", "LDLCALC", "TRIG", "CHOLHDL", "LDLDIRECT" in the last 72 hours. Thyroid Function Tests: No results for input(s): "TSH", "T4TOTAL", "FREET4", "T3FREE", "THYROIDAB" in the last 72 hours. Anemia Panel: No results for input(s):  "VITAMINB12", "FOLATE", "FERRITIN", "TIBC", "IRON", "RETICCTPCT" in the last 72 hours. Urine analysis:    Component Value Date/Time   COLORURINE YELLOW 11/22/2019 1351   APPEARANCEUR CLEAR 11/22/2019 1351   LABSPEC 1.008 11/22/2019 1351   PHURINE 5.0 11/22/2019 1351   GLUCOSEU NEGATIVE 11/22/2019 1351   HGBUR SMALL (A) 11/22/2019 1351   BILIRUBINUR NEGATIVE 11/22/2019 1351   KETONESUR NEGATIVE 11/22/2019 1351   PROTEINUR NEGATIVE 11/22/2019 1351   UROBILINOGEN 0.2 11/02/2012 1530   NITRITE NEGATIVE 11/22/2019 1351   LEUKOCYTESUR NEGATIVE 11/22/2019 1351   Sepsis Labs: _1 (procalcitonin:4,lacticidven:4)  ) Recent Results (from the past 240 hour(s))  Resp Panel by RT-PCR (Flu A&B, Covid) Anterior Nasal Swab     Status: None   Collection Time: 07/23/22  9:52 AM   Specimen: Anterior Nasal Swab  Result Value Ref Range Status   SARS Coronavirus 2 by RT PCR NEGATIVE NEGATIVE Final    Comment: (  NOTE) SARS-CoV-2 target nucleic acids are NOT DETECTED.  The SARS-CoV-2 RNA is generally detectable in upper respiratory specimens during the acute phase of infection. The lowest concentration of SARS-CoV-2 viral copies this assay can detect is 138 copies/mL. A negative result does not preclude SARS-Cov-2 infection and should not be used as the sole basis for treatment or other patient management decisions. A negative result may occur with  improper specimen collection/handling, submission of specimen other than nasopharyngeal swab, presence of viral mutation(s) within the areas targeted by this assay, and inadequate number of viral copies(<138 copies/mL). A negative result must be combined with clinical observations, patient history, and epidemiological information. The expected result is Negative.  Fact Sheet for Patients:  EntrepreneurPulse.com.au  Fact Sheet for Healthcare Providers:  IncredibleEmployment.be  This test is no t yet approved  or cleared by the [place] FDA and  has been authorized for detection and/or diagnosis of SARS-CoV-2 by FDA under an Emergency Use Authorization (EUA). This EUA will remain  in effect (meaning this test can be used) for the duration of the COVID-19 declaration under Section 564(b)(1) of the Act, 21 U.S.C.section 360bbb-3(b)(1), unless the authorization is terminated  or revoked sooner.       Influenza A by PCR NEGATIVE NEGATIVE Final   Influenza B by PCR NEGATIVE NEGATIVE Final    Comment: (NOTE) The Xpert Xpress SARS-CoV-2/FLU/RSV plus assay is intended as an aid in the diagnosis of influenza from Nasopharyngeal swab specimens and should not be used as a sole basis for treatment. Nasal washings and aspirates are unacceptable for Xpert Xpress SARS-CoV-2/FLU/RSV testing.  Fact Sheet for Patients: EntrepreneurPulse.com.au  Fact Sheet for Healthcare Providers: IncredibleEmployment.be  This test is not yet approved or cleared by the [place] FDA and has been authorized for detection and/or diagnosis of SARS-CoV-2 by FDA under an Emergency Use Authorization (EUA). This EUA will remain in effect (meaning this test can be used) for the duration of the COVID-19 declaration under Section 564(b)(1) of the Act, 21 U.S.C. section 360bbb-3(b)(1), unless the authorization is terminated or revoked.  Performed at [facility], [place] [address], [place], [place] [address]   Respiratory (~20 pathogens) panel by PCR     Status: None   Collection Time: 07/25/22  6:15 PM   Specimen: Nasopharyngeal Swab; Respiratory  Result Value Ref Range Status   Adenovirus NOT DETECTED NOT DETECTED Final   Coronavirus 229E NOT DETECTED NOT DETECTED Final    Comment: (NOTE) The Coronavirus on the Respiratory Panel, DOES NOT test for the novel  Coronavirus (2019 nCoV)    Coronavirus HKU1 NOT DETECTED NOT DETECTED Final   Coronavirus NL63 NOT DETECTED NOT  DETECTED Final   Coronavirus OC43 NOT DETECTED NOT DETECTED Final   Metapneumovirus NOT DETECTED NOT DETECTED Final   Rhinovirus / Enterovirus NOT DETECTED NOT DETECTED Final   Influenza A NOT DETECTED NOT DETECTED Final   Influenza B NOT DETECTED NOT DETECTED Final   Parainfluenza Virus 1 NOT DETECTED NOT DETECTED Final   Parainfluenza Virus 2 NOT DETECTED NOT DETECTED Final   Parainfluenza Virus 3 NOT DETECTED NOT DETECTED Final   Parainfluenza Virus 4 NOT DETECTED NOT DETECTED Final   Respiratory Syncytial Virus NOT DETECTED NOT DETECTED Final   Bordetella pertussis NOT DETECTED NOT DETECTED Final   Bordetella Parapertussis NOT DETECTED NOT DETECTED Final   Chlamydophila pneumoniae NOT DETECTED NOT DETECTED Final   Mycoplasma pneumoniae NOT DETECTED NOT DETECTED Final    Comment: Performed at [facility], [place] [address] [place], [place] [address]     Radiology Studies: No results found.   Scheduled Meds:  apixaban  5 mg Oral BID   ARIPiprazole  5 mg Oral Daily   atorvastatin  40 mg Oral QHS   budesonide (PULMICORT) nebulizer solution  0.25 mg Nebulization BID   Chlorhexidine Gluconate Cloth  6 each Topical Daily   clonazePAM  0.25 mg Oral BID   dapagliflozin propanediol  10 mg Oral Daily   doxepin  100 mg Oral QHS   FLUoxetine  40 mg Oral Daily   insulin aspart  0-9 Units Subcutaneous TID WC   insulin detemir  10 Units Subcutaneous Daily   ipratropium-albuterol  3 mL Nebulization TID   methylPREDNISolone (SOLU-MEDROL) injection  125 mg Intravenous Daily   midodrine  5 mg Oral TID WC   nicotine  7 mg Transdermal Daily   nystatin  5 mL Oral QID   pantoprazole  40 mg Oral Daily   sodium chloride flush  3 mL Intravenous Q12H   sodium chloride flush  3 mL Intravenous Q12H   spironolactone  12.5 mg Oral Daily   torsemide  20 mg Oral Daily   Continuous Infusions:  sodium chloride       LOS: 7 days    Time spent: 12mn  PDomenic Polite MD [facility]   07/30/2022, 12:04 PM

## 2022-07-30 NOTE — Progress Notes (Addendum)
Patient ID: [name], female   DOB: [birth year], 61 y.o.   MRN: [id number]     Advanced Heart Failure Rounding Note  PCP-Cardiologist: None   Subjective:    On BiPAP at night. Remains on 12-14L HFNC during day. Steroids started by Pulmonary yesterday.  Feeling okay. Gets tired easily with light activity.   Echo EF 60-65% RV reduced.    RHC Procedural Findings: Hemodynamics (mmHg) RA mean 11 RV 45/15 PA 44/19, mean 28 PCWP mean 4 Oxygen saturations: PA 52% AO 91% Cardiac Output (Fick) 4.69  Cardiac Index (Fick) 2.28 PVR 5.1 WU Cardiac Output (thermo) 5.46 Cardiac Index (thermo) 2.65  PVR 4.4 WU PAPI 2.3   Objective:   Weight Range: 91 kg Body mass index is 32.38 kg/m.   Vital Signs:   Temp:  [97.9 F (36.6 C)-98.8 F (37.1 C)] 97.9 F (36.6 C) (11/10 0426) Pulse Rate:  [78-87] 83 (11/10 0426) Resp:  [20-28] 21 (11/10 0426) BP: (93-110)/(49-64) 98/64 (11/10 0426) SpO2:  [89 %-99 %] 96 % (11/10 0426) FiO2 (%):  [45 %-50 %] 45 % (11/10 0347) Weight:  [91 kg] 91 kg (11/10 0426) Last BM Date : 07/27/22  Weight change: Filed Weights   07/28/22 0453 07/29/22 0455 07/30/22 0426  Weight: 92.3 kg 93.2 kg 91 kg    Intake/Output:   Intake/Output Summary (Last 24 hours) at 07/30/2022 0811 Last data filed at 07/30/2022 0444 Gross per 24 hour  Intake 240 ml  Output 1650 ml  Net -1410 ml      Physical Exam  General: Appears comfortable, sitting up in bed. HEENT: normal Neck: supple. JVP ~ 6-7 cm. Carotids 2+ bilat; no bruits. Cor: PMI nondisplaced. Regular rate & rhythm. No rubs, gallops or murmurs. Lungs: bibasilar crackles, on HFNC Abdomen: soft, nontender, nondistended.  Extremities: no cyanosis, clubbing, rash, edema Neuro: alert & orientedx3, cranial nerves grossly intact. moves all 4 extremities w/o difficulty. Affect pleasant   Telemetry  SR 80s-90s with PACs   EKG    N/A  Labs    CBC Recent Labs    07/29/22 0055  07/30/22 0038  WBC 9.0 7.3  HGB 11.6* 12.0  HCT 37.4 39.2  MCV 88.0 89.5  PLT 280 237   Basic Metabolic Panel Recent Labs    07/29/22 0055 07/30/22 0038  NA 138 136  K 4.3 4.9  CL 98 96*  CO2 29 29  GLUCOSE 152* 214*  BUN 31* 29*  CREATININE 1.36* 1.11*  CALCIUM 9.1 9.3   Liver Function Tests No results for input(s): "AST", "ALT", "ALKPHOS", "BILITOT", "PROT", "ALBUMIN" in the last 72 hours. No results for input(s): "LIPASE", "AMYLASE" in the last 72 hours. Cardiac Enzymes No results for input(s): "CKTOTAL", "CKMB", "CKMBINDEX", "TROPONINI" in the last 72 hours.  BNP: BNP (last 3 results) Recent Labs    07/23/22 0952 07/28/22 0121  BNP 905.0* 625.8*    ProBNP (last 3 results) No results for input(s): "PROBNP" in the last 8760 hours.   D-Dimer No results for input(s): "DDIMER" in the last 72 hours. Hemoglobin A1C No results for input(s): "HGBA1C" in the last 72 hours. Fasting Lipid Panel No results for input(s): "CHOL", "HDL", "LDLCALC", "TRIG", "CHOLHDL", "LDLDIRECT" in the last 72 hours. Thyroid Function Tests No results for input(s): "TSH", "T4TOTAL", "T3FREE", "THYROIDAB" in the last 72 hours.  Invalid input(s): "FREET3"  Other results:   Imaging    No results found.   Medications:     Scheduled Medications:  apixaban  5 mg  Oral BID   ARIPiprazole  5 mg Oral Daily   atorvastatin  40 mg Oral QHS   budesonide (PULMICORT) nebulizer solution  0.25 mg Nebulization BID   Chlorhexidine Gluconate Cloth  6 each Topical Daily   clonazePAM  0.25 mg Oral BID   dapagliflozin propanediol  10 mg Oral Daily   doxepin  100 mg Oral QHS   FLUoxetine  40 mg Oral Daily   insulin aspart  0-9 Units Subcutaneous TID WC   ipratropium-albuterol  3 mL Nebulization TID   methylPREDNISolone (SOLU-MEDROL) injection  125 mg Intravenous Daily   midodrine  5 mg Oral TID WC   nicotine  7 mg Transdermal Daily   nystatin  5 mL Oral QID   pantoprazole  40 mg Oral Daily    sodium chloride flush  3 mL Intravenous Q12H   sodium chloride flush  3 mL Intravenous Q12H   spironolactone  12.5 mg Oral Daily   torsemide  20 mg Oral Daily    Infusions:  sodium chloride      PRN Medications: sodium chloride, acetaminophen, dicyclomine, metoprolol tartrate, ondansetron (ZOFRAN) IV, sodium chloride flush   Assessment/Plan    1. Acute hypoxemic respiratory failure: Now on 12 L oxygen.  Has COPD, but does not wear oxygen at home. She was smoking up to this admission.  Suspect combination of AECOPD (emphysema, smoking-related ILD) and RV failure/cor pulmonale.  No evidence for PE by V/Q scan. RHC with low PCWP and elevated right heart pressures, suggesting cor pulmonale.  Given that she still has significant oxygen requirement, it appears that her lung parenchymal disease is quite significant.  - Pulmonary appreciated. Sed rate 95, CRP 18 - Started on empiric steroids by [facility] 11/09 2. RV failure/cor pulmonale: Echo showed EF 60-65%, D-shaped septum, moderate RV enlargement with severely decreased RV systolic function, IVC dilated. RHC with low PCWP, elevated right-sided filling pressures, mild PAH, preserved cardiac output. Suspect RV failure due to parenchymal lung disease (emphysema, smoking-related ILD noted on HRCT 4/23).  She appears well-diuresed at this point with weight significantly down. Creatinine stable.  - Volume status stable. Continue torsemide 20 mg daily.   - Continue spironolactone 12.5 daily.  - Continue Farxiga 10 mg daily.   - Continue midodrine 5 mg tid, SBP stable 90s-100s - Renal function stable.   3. 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  HRCT 4/23 showed emphysema and smoking-related ILD.  V/Q scan did not show evidence for PE.  - Does not appear to near further diuresis - Continue supplemental O2. Notes she did not require supplemental O2 prior to admission 4. COPD: Emphysema on chest imaging.  Active  smoker at admission.  - Needs to quit smoking.  5. Atrial fibrillation: Paroxysmal.   -In SR currently - Continue  Eliquis.  Co-Pay $45.00. She will need 30 day free card.  6. AKI: Creatinine 2.6 at admission, down to 1.16 today. Follow closely, suspect cardiorenal syndrome.  7. Cirrhosis: Possible cirrhosis by RUQ [place].  This may be due to RV failure.  8. 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  9. Chest pain: Resolved.  No known CAD but history of smoking. HS-TnI mildly elevated with no trend at admission but thought to be demand ischemia from hypoxemia and volume overload.   - EF 60-65% RV severely reduced.  No PE on VQ  scan 11/6 - HS Trop no trend    Mobilize.   Length of Stay: 7  FINCH, LINDSAY N, PA-C  07/30/2022, 8:11 AM  Advanced Heart Failure Team Pager [phone] (M-F; 7a - 5p)  Please contact [facility] for night-coverage after hours (5p -7a ) and weekends on [email or web address]  Agree with the above PA note.   She is still on 10L/min oxygen.  CT chest showed upper lobe GGO, ?pulmonary edema versus infectious/inflammatory.  ESR elevated at 95 and CRP 18.  On Solumedrol.    Creatinine 1.36 => 1.11.    General: NAD Neck: No JVD, no thyromegaly or thyroid nodule.  Lungs: Distant BS. CV: Nondisplaced PMI.  Heart regular S1/S2, no S3/S4, no murmur.  No peripheral edema.  No carotid bruit.  Normal pedal pulses.  Abdomen: Soft, nontender, no hepatosplenomegaly, no distention.  Skin: Intact without lesions or rashes.  Neurologic: Alert and oriented x 3.  Psych: Normal affect. Extremities: No clubbing or cyanosis.  HEENT: Normal.   Still on 10 L/min oxygen. I do not think that aggressive diuresis is going to help much, she does not appear volume overloaded at this point and RHC showed low PCWP.  Creatinine lower at 1.11. ?Inflammatory lung disease/ILD process with very elevated ESR and CRP.  - Continue torsemide 20 mg daily, spironolactone 12.5 daily,  Farxiga 10 daily.  - Getting steroids for possible inflammatory lung process, pulmonary following.   No changes from my standpoint, will see again Monday unless called.   Loralie Champagne 07/30/2022

## 2022-07-30 NOTE — Progress Notes (Signed)
Paitent already placed on BiPAP by RN. Patient resting comfortably.

## 2022-07-30 NOTE — Progress Notes (Signed)
Physical Therapy Treatment Patient Details Name: SEREN CHALOUX MRN: [id number] DOB: 10/13/1960 Today's Date: 07/30/2022   History of Present Illness Pt is a 61 y.o. F who presents 07/23/2022 with dyspnea and LE edema. Working diagnosis of heart failure/COPD decompensation. Significant PMH: COPD, HTN, dyslipidemia, fibromyalgia, DM2.    PT Comments    The pt was agreeable to session and eager to progress OOB mobility. She was resting comfortably on 10L O2 with SpO2 in 90s. However, with hallway ambulation, pt reports fatigue in LE and SpO2 low of 74% on 10L O2 needing 5 min seated rest with 15L to recover. After seated rest, pt tolerated return to 10L O2 with SpO2 stable. For second bout of ambulation, pt completed 30 ft on 15L (tank only goes from 10L to 15L with no settings between), and maintained SpO2 >92%. Will continue to benefit from skilled PT and mobility to progress endurance and maintain strength. Pt in agreement.     Recommendations for follow up therapy are one component of a multi-disciplinary discharge planning process, led by the attending physician.  Recommendations may be updated based on patient status, additional functional criteria and insurance authorization.  Follow Up Recommendations  No PT follow up (pending progress)     Assistance Recommended at Discharge PRN  Patient can return home with the following A little help with walking and/or transfers;Assistance with cooking/housework;Assist for transportation;Help with stairs or ramp for entrance   Equipment Recommendations  Rollator (4 wheels)    Recommendations for Other Services       Precautions / Restrictions Precautions Precautions: Fall;Other (comment) Precaution Comments: watch O2, on 10-15L this session Restrictions Weight Bearing Restrictions: No     Mobility  Bed Mobility Overal bed mobility: Modified Independent                  Transfers Overall transfer level: Needs  assistance Equipment used: None, Rollator (4 wheels) Transfers: Sit to/from Stand Sit to Stand: Supervision           General transfer comment: supervision for safety. Instructed patient on rollator mechanics and use of brakes prior to sitting or standing    Ambulation/Gait Ambulation/Gait assistance: Supervision Gait Distance (Feet): 45 Feet (+30 ft on 15L) Assistive device: Rollator (4 wheels) Gait Pattern/deviations: Step-through pattern, Decreased stride length Gait velocity: decreased Gait velocity interpretation: <1.31 ft/sec, indicative of household ambulator   General Gait Details: supervison for safety with use of rollator. on 10L O2 initially with SpO2 to 74%, increased to 15L (tank only goes from 10-15). maintained in 90s on 15L with activity on second bout of 30 ft ambulation     Balance Overall balance assessment: Needs assistance Sitting-balance support: No upper extremity supported, Feet supported Sitting balance-Leahy Scale: Good     Standing balance support: Bilateral upper extremity supported, During functional activity Standing balance-Leahy Scale: Fair Standing balance comment: can stand and step without UE support, poor endurance requiring UE support with gait                            Cognition Arousal/Alertness: Awake/alert Behavior During Therapy: WFL for tasks assessed/performed Overall Cognitive Status: Within Functional Limits for tasks assessed                                             General Comments General comments (  skin integrity, edema, etc.): SpO2 stable on 10L at rest, low of 74% after ambulaiton on 10L, needing 5 min and 15L O2 to recover to 90s. maintained in 90s with 30 ft ambulation on 15L (tank only goes from 10-15L with no options between)      Pertinent Vitals/Pain Pain Assessment Pain Assessment: Faces Faces Pain Scale: Hurts a little bit Pain Location: "all over" Pain Descriptors /  Indicators: Discomfort Pain Intervention(s): Monitored during session     PT Goals (current goals can now be found in the care plan section) Acute Rehab PT Goals Patient Stated Goal: did not state PT Goal Formulation: With patient Time For Goal Achievement: 08/08/22 Potential to Achieve Goals: Good Progress towards PT goals: Progressing toward goals    Frequency    Min 3X/week      PT Plan Current plan remains appropriate       AM-PAC PT "6 Clicks" Mobility   Outcome Measure  Help needed turning from your back to your side while in a flat bed without using bedrails?: None Help needed moving from lying on your back to sitting on the side of a flat bed without using bedrails?: None Help needed moving to and from a bed to a chair (including a wheelchair)?: A Little Help needed standing up from a chair using your arms (e.g., wheelchair or bedside chair)?: A Little Help needed to walk in hospital room?: A Little Help needed climbing 3-5 steps with a railing? : A Lot 6 Click Score: 19    End of Session Equipment Utilized During Treatment: Oxygen Activity Tolerance: Patient tolerated treatment well Patient left: in chair;with call bell/phone within reach Nurse Communication: Mobility status PT Visit Diagnosis: Unsteadiness on feet (R26.81);Difficulty in walking, not elsewhere classified (R26.2)     Time: 1020-1050 PT Time Calculation (min) (ACUTE ONLY): 30 min  Charges:  $Therapeutic Exercise: 23-37 mins                     West Carbo, PT, DPT   Acute Rehabilitation Department   Sandra Cockayne 07/30/2022, 11:00 AM

## 2022-07-30 NOTE — TOC CM/SW Note (Addendum)
[facility] rep, Erasmo Downer notified of possible DME for home with medically stable, Bipap, and oxygen. Pt not medically stable for dc, will need orders closer to dc. Will continue to follow for dc needs.  [place], Heart Failure TOC CM [phone]

## 2022-07-30 NOTE — Progress Notes (Signed)
NAME:  [name], MRN:  [id number], DOB:  [birth year], LOS: 7 ADMISSION DATE:  07/23/2022, CONSULTATION DATE: 07/28/2022 REFERRING MD: [facility], CHIEF COMPLAINT: Refractory hypoxia  History of Present Illness:  61 year old lifelong smoker who was seen by our practice in 2014 with recommendations to quit smoking following surgery of the abdomen.  He now presents after being noncompliant with medications and not seeking medical help until she was extremely edematous and short of breath.  She has been aggressively diuresed to 12 L and reports she is breathing better.  Her weight on admission was 228 pounds and her weight today 07/28/2022 203 pounds.  She has been aggressively diuresed with improvement.  CT scan does show groundglass opacities.  2D echo reveals RV dysfunction status post right heart cath with RV dysfunction.  Pulmonary critical care asked to evaluate groundglass disease for aggressive diuresis questionable role for steroids to follow.  Pertinent  Medical History   Past Medical History:  Diagnosis Date   Acid reflux disease    Anxiety    Asthma    Bronchitis, chronic (HCC)    Effects worse with URI   Chronic pain syndrome    COPD (chronic obstructive pulmonary disease) ([place])    Depression    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, type 2   Diverticulitis    Dysplasia of cervix (uteri)    patient states she had dysplasia of the uterus stage 4   Endometrial cancer (HCC)    Fibromyalgia    Fibromyalgia    GERD (gastroesophageal reflux disease)    Headache(784.0)    migraine headache   Hyperlipidemia    Hypertension    Kidney stones    at least 6 stones in past   Pneumonia 2019   Restless leg syndrome    Bilateral   Vomiting    last night at 2300 after last round of antibiotic     Significant Hospital Events: Including procedures, antibiotic start and stop dates in addition to other pertinent events   07/29/2022 started on steroids  Interim  History / Subjective:  Feeling better today  Objective   Blood pressure 98/64, pulse 83, temperature 97.9 F (36.6 C), temperature source Oral, resp. rate (!) 21, height _0  (1.676 m), weight 91 kg, last menstrual period 04/20/2013, SpO2 94 %.    FiO2 (%):  [45 %-50 %] 45 %   Intake/Output Summary (Last 24 hours) at 07/30/2022 0837 Last data filed at 07/30/2022 0444 Gross per 24 hour  Intake 240 ml  Output 1650 ml  Net -1410 ml   Filed Weights   07/28/22 0453 07/29/22 0455 07/30/22 0426  Weight: 92.3 kg 93.2 kg 91 kg    Examination: General: Obese female who appears to be feeling better today HEENT: MM pink/moist no JVD is appreciated Neuro: More interactive CV: Heart sounds are irregular PULM: Diminished in the bases GI: soft, bsx4 active  GU: Amber urine Extremities: warm/dry, negative edema  Skin: no rashes or lesions     Resolved Hospital Problem list     Assessment & Plan:  Acute on chronic hypoxic respiratory failure in the setting of continued tobacco abuse, RV dysfunction, noncompliance with extreme volume overload and still requiring 12 L nasal cannula despite 12 L negative intake and output.  CT scan with groundglass opacities bilateral.  Questionable component of pneumonia.  2D echo with RV dysfunction.  Intake/Output Summary (Last 24 hours) at 07/30/2022 0837 Last data filed at 07/30/2022 0444 Gross per 24  hour  Intake 240 ml  Output 1650 ml  Net -1410 ml   Lab Results  Component Value Date   CREATININE 1.11 (H) 07/30/2022   CREATININE 1.36 (H) 07/29/2022   CREATININE 1.16 (H) 07/28/2022   CREATININE 0.79 11/22/2019   CREATININE 0.70 01/21/2014   Started on Solu-Medrol 07/29/2022 for elevated ESR and CRP.  She reports feeling better today 07/30/2022 Continue to diurese.  Note creatinine 1.11 Intermittent positive pressure ventilation Follow-up with pulmonary as an outpatient Pulmonary will continue to follow 07/30/2022 turned down to 10 L of  oxygen     Diastolic dysfunction Per primary  Noncompliance Per primary  Tobacco abuse Per primary  Morbid obesity Per primary  Best Practice (right click and "Reselect all SmartList Selections" daily)   Diet/type: Regular consistency (see orders) DVT prophylaxis: DOAC GI prophylaxis: PPI Lines: N/A Foley:  N/A Code Status:  full code Last date of multidisciplinary goals of care discussion [tbd]  Labs   CBC: Recent Labs  Lab 07/23/22 0952 07/23/22 1423 07/26/22 1633 07/27/22 0059 07/28/22 0121 07/29/22 0055 07/30/22 0038  WBC 13.0*  --   --  5.9 8.8 9.0 7.3  NEUTROABS 10.5*  --   --   --   --   --   --   HGB 11.0*   < > 12.2  11.9* 10.7* 11.6* 11.6* 12.0  HCT 36.0   < > 36.0  35.0* 35.0* 35.7* 37.4 39.2  MCV 91.4  --   --  89.3 87.3 88.0 89.5  PLT 230  --   --  273 247 280 303   < > = values in this interval not displayed.    Basic Metabolic Panel: Recent Labs  Lab 07/26/22 0032 07/26/22 1633 07/27/22 0059 07/28/22 0121 07/29/22 0055 07/30/22 0038  NA 139 140  141 139 136 138 136  K 3.4* 4.2  4.2 4.2 4.2 4.3 4.9  CL 101  --  98 97* 98 96*  CO2 27  --  _0 GLUCOSE 131*  --  117* 136* 152* 214*  BUN 45*  --  36* 34* 31* 29*  CREATININE 1.46*  --  1.16* 1.16* 1.36* 1.11*  CALCIUM 8.5*  --  8.9 8.9 9.1 9.3  MG  --   --  1.8  --   --   --    GFR: Estimated Creatinine Clearance: 59 mL/min (A) (by C-G formula based on SCr of 1.11 mg/dL (H)). Recent Labs  Lab 07/27/22 0059 07/28/22 0121 07/29/22 0055 07/30/22 0038  WBC 5.9 8.8 9.0 7.3    Liver Function Tests: Recent Labs  Lab 07/23/22 0952 07/24/22 0107  AST 51* 38  ALT 12 9  ALKPHOS 157* 141*  BILITOT 1.1 1.9*  PROT 6.5 6.6  ALBUMIN 2.0* 2.4*   No results for input(s): "LIPASE", "AMYLASE" in the last 168 hours. Recent Labs  Lab 07/24/22 0059  AMMONIA 25    ABG    Component Value Date/Time   PHART 7.44 07/24/2022 0529   PCO2ART 35 07/24/2022 0529   PO2ART 119  (H) 07/24/2022 0529   HCO3 30.8 (H) 07/26/2022 1633   HCO3 31.0 (H) 07/26/2022 1633   TCO2 32 07/26/2022 1633   TCO2 32 07/26/2022 1633   ACIDBASEDEF 0.1 07/24/2022 0529   O2SAT 53 07/26/2022 1633   O2SAT 51 07/26/2022 1633     Coagulation Profile: Recent Labs  Lab 07/23/22 1255 07/24/22 0107  INR 1.4* 1.4*    Cardiac Enzymes: No  results for input(s): "CKTOTAL", "CKMB", "CKMBINDEX", "TROPONINI" in the last 168 hours.  HbA1C: Hgb A1c MFr Bld  Date/Time Value Ref Range Status  07/23/2022 12:55 PM 6.2 (H) 4.8 - 5.6 % Final    Comment:    (NOTE) Pre diabetes:          5.7%-6.4%  Diabetes:              >6.4%  Glycemic control for   <7.0% adults with diabetes   05/18/2019 11:31 AM 6.8 (H) 4.8 - 5.6 % Final    Comment:    (NOTE) Pre diabetes:          5.7%-6.4% Diabetes:              >6.4% Glycemic control for   <7.0% adults with diabetes     CBG: Recent Labs  Lab 07/29/22 0606 07/29/22 1136 07/29/22 1606 07/29/22 2127 07/30/22 0646  GLUCAP 133* 111* 115* 228* 185*      Critical care time: Ferol Luz Yitzchak Kothari ACNP Acute Care Nurse Practitioner [place] Please consult Amion 07/30/2022, 8:37 AM

## 2022-07-30 NOTE — Progress Notes (Signed)
   07/30/22 1500  Mobility  Activity Ambulated with assistance in hallway  Level of Assistance Standby assist, set-up cues, supervision of patient - no hands on  Assistive Device Four wheel walker  Distance Ambulated (ft) 40 ft  Activity Response Tolerated fair  Mobility Referral Yes  $Mobility charge 1 Mobility   Mobility Specialist Progress Note  Pre-Mobility: 94% SpO2 During Mobility: 86-91% SpO2 Post-Mobility: 94% SpO2  Pt in bed and agreeable. Mobility cut short d/t SOB but recovered after being coached through pursed lip breathing. Returned to bed w/ all needs met and call bell in reach.   [name] Mobility Specialist

## 2022-07-30 NOTE — Plan of Care (Signed)
Pt started on nystatin for sore mouth and tongue. Pt had BM x1. Pt worked with physical therapy and mobility aid today; pt ambulates independently to [facility].  Problem: Education: Goal: Ability to describe self-care measures that may prevent or decrease complications (Diabetes Survival Skills Education) will improve Outcome: Progressing Goal: Individualized Educational Video(s) Outcome: Progressing   Problem: Coping: Goal: Ability to adjust to condition or change in health will improve Outcome: Progressing   Problem: Fluid Volume: Goal: Ability to maintain a balanced intake and output will improve Outcome: Progressing   Problem: Health Behavior/Discharge Planning: Goal: Ability to identify and utilize available resources and services will improve Outcome: Progressing Goal: Ability to manage health-related needs will improve Outcome: Progressing   Problem: Metabolic: Goal: Ability to maintain appropriate glucose levels will improve Outcome: Progressing   Problem: Nutritional: Goal: Maintenance of adequate nutrition will improve Outcome: Progressing Goal: Progress toward achieving an optimal weight will improve Outcome: Progressing   Problem: Skin Integrity: Goal: Risk for impaired skin integrity will decrease Outcome: Progressing   Problem: Tissue Perfusion: Goal: Adequacy of tissue perfusion will improve Outcome: Progressing   Problem: Education: Goal: Knowledge of General Education information will improve Description: Including pain rating scale, medication(s)/side effects and non-pharmacologic comfort measures Outcome: Progressing   Problem: Health Behavior/Discharge Planning: Goal: Ability to manage health-related needs will improve Outcome: Progressing   Problem: Clinical Measurements: Goal: Ability to maintain clinical measurements within normal limits will improve Outcome: Progressing Goal: Will remain free from infection Outcome: Progressing Goal: Diagnostic  test results will improve Outcome: Progressing Goal: Respiratory complications will improve Outcome: Progressing Goal: Cardiovascular complication will be avoided Outcome: Progressing   Problem: Activity: Goal: Risk for activity intolerance will decrease Outcome: Progressing   Problem: Nutrition: Goal: Adequate nutrition will be maintained Outcome: Progressing   Problem: Coping: Goal: Level of anxiety will decrease Outcome: Progressing   Problem: Elimination: Goal: Will not experience complications related to bowel motility Outcome: Progressing Goal: Will not experience complications related to urinary retention Outcome: Progressing   Problem: Pain Managment: Goal: General experience of comfort will improve Outcome: Progressing   Problem: Safety: Goal: Ability to remain free from injury will improve Outcome: Progressing   Problem: Skin Integrity: Goal: Risk for impaired skin integrity will decrease Outcome: Progressing   Problem: Education: Goal: Ability to demonstrate management of disease process will improve Outcome: Progressing Goal: Ability to verbalize understanding of medication therapies will improve Outcome: Progressing Goal: Individualized Educational Video(s) Outcome: Progressing   Problem: Activity: Goal: Capacity to carry out activities will improve Outcome: Progressing   Problem: Cardiac: Goal: Ability to achieve and maintain adequate cardiopulmonary perfusion will improve Outcome: Progressing   Problem: Education: Goal: Knowledge of General Education information will improve Description: Including pain rating scale, medication(s)/side effects and non-pharmacologic comfort measures Outcome: Progressing   Problem: Health Behavior/Discharge Planning: Goal: Ability to manage health-related needs will improve Outcome: Progressing   Problem: Clinical Measurements: Goal: Ability to maintain clinical measurements within normal limits will  improve Outcome: Progressing Goal: Will remain free from infection Outcome: Progressing Goal: Diagnostic test results will improve Outcome: Progressing Goal: Respiratory complications will improve Outcome: Progressing Goal: Cardiovascular complication will be avoided Outcome: Progressing   Problem: Activity: Goal: Risk for activity intolerance will decrease Outcome: Progressing   Problem: Nutrition: Goal: Adequate nutrition will be maintained Outcome: Progressing   Problem: Coping: Goal: Level of anxiety will decrease Outcome: Progressing   Problem: Elimination: Goal: Will not experience complications related to bowel motility Outcome: Progressing Goal: Will not experience  complications related to urinary retention Outcome: Progressing   Problem: Pain Managment: Goal: General experience of comfort will improve Outcome: Progressing   Problem: Safety: Goal: Ability to remain free from injury will improve Outcome: Progressing   Problem: Skin Integrity: Goal: Risk for impaired skin integrity will decrease Outcome: Progressing   Problem: Education: Goal: Understanding of CV disease, CV risk reduction, and recovery process will improve Outcome: Progressing Goal: Individualized Educational Video(s) Outcome: Progressing   Problem: Activity: Goal: Ability to return to baseline activity level will improve Outcome: Progressing   Problem: Cardiovascular: Goal: Ability to achieve and maintain adequate cardiovascular perfusion will improve Outcome: Progressing Goal: Vascular access site(s) Level 0-1 will be maintained Outcome: Progressing   Problem: Health Behavior/Discharge Planning: Goal: Ability to safely manage health-related needs after discharge will improve Outcome: Progressing

## 2022-07-31 DIAGNOSIS — I5033 Acute on chronic diastolic (congestive) heart failure: Secondary | ICD-10-CM | POA: Diagnosis not present

## 2022-07-31 LAB — GLUCOSE, CAPILLARY
Glucose-Capillary: 168 mg/dL — ABNORMAL HIGH (ref 70–99)
Glucose-Capillary: 184 mg/dL — ABNORMAL HIGH (ref 70–99)
Glucose-Capillary: 246 mg/dL — ABNORMAL HIGH (ref 70–99)
Glucose-Capillary: 293 mg/dL — ABNORMAL HIGH (ref 70–99)

## 2022-07-31 LAB — BASIC METABOLIC PANEL
Anion gap: 11 (ref 5–15)
BUN: 40 mg/dL — ABNORMAL HIGH (ref 8–23)
CO2: 29 mmol/L (ref 22–32)
Calcium: 9.4 mg/dL (ref 8.9–10.3)
Chloride: 95 mmol/L — ABNORMAL LOW (ref 98–111)
Creatinine, Ser: 1.16 mg/dL — ABNORMAL HIGH (ref 0.44–1.00)
GFR, Estimated: 53 mL/min — ABNORMAL LOW (ref >60)
Glucose, Bld: 233 mg/dL — ABNORMAL HIGH (ref 70–99)
Potassium: 4.1 mmol/L (ref 3.5–5.1)
Sodium: 135 mmol/L (ref 135–145)

## 2022-07-31 MED ORDER — IPRATROPIUM-ALBUTEROL 0.5-2.5 (3) MG/3ML IN SOLN
3.0000 mL | RESPIRATORY_TRACT | Status: DC | PRN
  Administered 2022-07-31: 3 mL via RESPIRATORY_TRACT
  Filled 2022-07-31: qty 3

## 2022-07-31 MED ORDER — INSULIN DETEMIR 100 UNIT/ML ~~LOC~~ SOLN
15.0000 [IU] | Freq: Every day | SUBCUTANEOUS | Status: DC
  Administered 2022-07-31: 15 [IU] via SUBCUTANEOUS
  Filled 2022-07-31 (×2): qty 0.15

## 2022-07-31 MED ORDER — IPRATROPIUM-ALBUTEROL 0.5-2.5 (3) MG/3ML IN SOLN
3.0000 mL | Freq: Two times a day (BID) | RESPIRATORY_TRACT | Status: DC
  Administered 2022-07-31 – 2022-08-04 (×8): 3 mL via RESPIRATORY_TRACT
  Filled 2022-07-31 (×8): qty 3

## 2022-07-31 NOTE — Progress Notes (Addendum)
Tried to wean down oxygen, pts is de-sating, right now on 9 l/ min  92 %

## 2022-07-31 NOTE — Progress Notes (Signed)
PROGRESS NOTE    [name]  VZC:[id number] DOB: October 26, 1960 DOA: 07/23/2022 PCP: Ginger Organ., MD  61 y/o female active smoker w/ h/o COPD, HTN, HLD, Type 2DM and chronic pain syndrome/ fibromyalgia, who presented to ED on 11/3 w/ complains of 3 wk h/o progressive SOB, cough and wheezing,   In ED, found to be in respiratory distress and tachypneic, BNP 905. Creat 2.6 CXR w/ mild diffuse interstitial densities bilaterally c/w pulmonary edema vs possible atypical inflammation.  2D Echo showed severe RV failure.  LVEF 60-65%, GIDD, severely reduced RV systolic function -Diuresed with IV Lasix, advanced heart failure team following, still remains profoundly hypoxic -HRCT 11/7: New mid and upper lung groundglass opacities favored to represent CHF, mediastinal adenopathy noted as well -11/8: Pulmonary consulted -11/9 started pulse IV steroids  Subjective: -Feels better after starting steroids, breathing largely unchanged, no wheezing  Assessment and Plan:  Acute Hypoxic resp failure -multifactorial, CHF, with underlying COPD,?  ILD flare -VQ scan negative for PE -diuresed with IV lasix, -12 L, oxygen requirement remains high-unchanged -HRCT 4/23, concerning for emphysema and interstitial fibrosis, still smoking -Repeat CT chest 11/7 with new upper and middle lobe groundglass opacities favored to represent CHF, but wedge pressure is down and appears adequately diuresed, neg 13.2 L, po torsemide  -Resp viral panel, flu and Covid neg -appreciate pulmonary input, ESR and CRP is high,  -Started high-dose pulse steroids 11/9-slowly improving -Continue nebs, incentive spirometry, out of bed to chair -Continue to increase activity and wean O2, down to 8 L this morning   Acute on chronic diastolic CHF RV failure, Cor Pulmonale Pulm HTN -Echo preserved LV systolic function EF 60 to 12%,, RV systolic function with severe reduction, -RHC 11/6 showed R>>L heart failure, c low wedge and  elevated right heart pressures suggesting cor pulmonale -diuresed w/ IV lasix, 13.2 L neg, now on oral torsemide, Aldactone, Jardiance -Heart failure team following -remains on midodrine   Acute exacerbation of COPD with asthma ([place]) -on bronchodilator therapy   P.Afib -in NSR, continue eliquis   AKI  Hypokalemia  -creat 2.6 on admission, cardiorenal, improving now 1.1   Cirrhosis -suspected on RUQ US-11/3 -could be from RV failure, check hepatitis C   HIV positive screen-lab error -HIV viral RNA not detected, confirmatory test negative   Essential hypertension Continue blood pressure support with midodrine.    Acid reflux disease Continue acid suppressive therapy.    Type 2 diabetes mellitus with hyperlipidemia (HCC) -CBGs stable, continue Levemir   Fibromyalgia Depression,  Continue with aripiprazole, clonazepam    Class 2 obesity Calculated BMI is 36.3      DVT prophylaxis:apixaban Code Status: FUll Code Family Communication: None present Disposition: Home pending improvement in hypoxia   Consultants: Cards and pulmonary  Objective: Vitals:   07/30/22 2337 07/31/22 0546 07/31/22 0755 07/31/22 0919  BP: (!) 119/59 (!) 129/51 (!) 125/59   Pulse: 93 89 85   Resp:   16   Temp: 97.6 F (36.4 C) 97.8 F (36.6 C) 97.9 F (36.6 C)   TempSrc: Oral Oral Oral   SpO2: 98% 98% 97% 97%  Weight:  88.7 kg    Height:        Intake/Output Summary (Last 24 hours) at 07/31/2022 1120 Last data filed at 07/31/2022 1020 Gross per 24 hour  Intake 720 ml  Output 1350 ml  Net -630 ml   Filed Weights   07/29/22 0455 07/30/22 0426 07/31/22 0546  Weight: 93.2 kg  91 kg 88.7 kg    Examination:  General exam: Chronically ill female sitting up in bed, AAOx3, no distress HEENT: Neck obese unable to assess JVD CVS: S1-S2, regular rhythm Lungs: Few scattered rales Abdomen: Soft, nontender, bowel sounds present Extremities: No edema Skin: No rashes Psychiatry:  Mood  & affect appropriate.     Data Reviewed:   CBC: Recent Labs  Lab 07/26/22 1633 07/27/22 0059 07/28/22 0121 07/29/22 0055 07/30/22 0038  WBC  --  5.9 8.8 9.0 7.3  HGB 12.2  11.9* 10.7* 11.6* 11.6* 12.0  HCT 36.0  35.0* 35.0* 35.7* 37.4 39.2  MCV  --  89.3 87.3 88.0 89.5  PLT  --  273 247 280 443   Basic Metabolic Panel: Recent Labs  Lab 07/27/22 0059 07/28/22 0121 07/29/22 0055 07/30/22 0038 07/31/22 0029  NA 139 136 138 136 135  K 4.2 4.2 4.3 4.9 4.1  CL 98 97* 98 96* 95*  CO2 _0 GLUCOSE 117* 136* 152* 214* 233*  BUN 36* 34* 31* 29* 40*  CREATININE 1.16* 1.16* 1.36* 1.11* 1.16*  CALCIUM 8.9 8.9 9.1 9.3 9.4  MG 1.8  --   --   --   --    GFR: Estimated Creatinine Clearance: 55.7 mL/min (A) (by C-G formula based on SCr of 1.16 mg/dL (H)). Liver Function Tests: No results for input(s): "AST", "ALT", "ALKPHOS", "BILITOT", "PROT", "ALBUMIN" in the last 168 hours.  No results for input(s): "LIPASE", "AMYLASE" in the last 168 hours. No results for input(s): "AMMONIA" in the last 168 hours.  Coagulation Profile: No results for input(s): "INR", "PROTIME" in the last 168 hours.  Cardiac Enzymes: No results for input(s): "CKTOTAL", "CKMB", "CKMBINDEX", "TROPONINI" in the last 168 hours. BNP (last 3 results) No results for input(s): "PROBNP" in the last 8760 hours. HbA1C: No results for input(s): "HGBA1C" in the last 72 hours. CBG: Recent Labs  Lab 07/30/22 1554 07/30/22 1742 07/30/22 2110 07/31/22 0556 07/31/22 1102  GLUCAP 274* 251* 265* 168* 184*   Lipid Profile: No results for input(s): "CHOL", "HDL", "LDLCALC", "TRIG", "CHOLHDL", "LDLDIRECT" in the last 72 hours. Thyroid Function Tests: No results for input(s): "TSH", "T4TOTAL", "FREET4", "T3FREE", "THYROIDAB" in the last 72 hours. Anemia Panel: No results for input(s): "VITAMINB12", "FOLATE", "FERRITIN", "TIBC", "IRON", "RETICCTPCT" in the last 72 hours. Urine analysis:    Component  Value Date/Time   COLORURINE YELLOW 11/22/2019 1351   APPEARANCEUR CLEAR 11/22/2019 1351   LABSPEC 1.008 11/22/2019 1351   PHURINE 5.0 11/22/2019 1351   GLUCOSEU NEGATIVE 11/22/2019 1351   HGBUR SMALL (A) 11/22/2019 1351   BILIRUBINUR NEGATIVE 11/22/2019 1351   KETONESUR NEGATIVE 11/22/2019 1351   PROTEINUR NEGATIVE 11/22/2019 1351   UROBILINOGEN 0.2 11/02/2012 1530   NITRITE NEGATIVE 11/22/2019 1351   LEUKOCYTESUR NEGATIVE 11/22/2019 1351   Sepsis Labs: _1 (procalcitonin:4,lacticidven:4)  ) Recent Results (from the past 240 hour(s))  Resp Panel by RT-PCR (Flu A&B, Covid) Anterior Nasal Swab     Status: None   Collection Time: 07/23/22  9:52 AM   Specimen: Anterior Nasal Swab  Result Value Ref Range Status   SARS Coronavirus 2 by RT PCR NEGATIVE NEGATIVE Final    Comment: (NOTE) SARS-CoV-2 target nucleic acids are NOT DETECTED.  The SARS-CoV-2 RNA is generally detectable in upper respiratory specimens during the acute phase of infection. The lowest concentration of SARS-CoV-2 viral copies this assay can detect is 138 copies/mL. A negative result does not preclude SARS-Cov-2 infection and should not  be used as the sole basis for treatment or other patient management decisions. A negative result may occur with  improper specimen collection/handling, submission of specimen other than nasopharyngeal swab, presence of viral mutation(s) within the areas targeted by this assay, and inadequate number of viral copies(<138 copies/mL). A negative result must be combined with clinical observations, patient history, and epidemiological information. The expected result is Negative.  Fact Sheet for Patients:  EntrepreneurPulse.com.au  Fact Sheet for Healthcare Providers:  IncredibleEmployment.be  This test is no t yet approved or cleared by the [place] FDA and  has been authorized for detection and/or diagnosis of SARS-CoV-2 by FDA  under an Emergency Use Authorization (EUA). This EUA will remain  in effect (meaning this test can be used) for the duration of the COVID-19 declaration under Section 564(b)(1) of the Act, 21 U.S.C.section 360bbb-3(b)(1), unless the authorization is terminated  or revoked sooner.       Influenza A by PCR NEGATIVE NEGATIVE Final   Influenza B by PCR NEGATIVE NEGATIVE Final    Comment: (NOTE) The Xpert Xpress SARS-CoV-2/FLU/RSV plus assay is intended as an aid in the diagnosis of influenza from Nasopharyngeal swab specimens and should not be used as a sole basis for treatment. Nasal washings and aspirates are unacceptable for Xpert Xpress SARS-CoV-2/FLU/RSV testing.  Fact Sheet for Patients: EntrepreneurPulse.com.au  Fact Sheet for Healthcare Providers: IncredibleEmployment.be  This test is not yet approved or cleared by the [place] FDA and has been authorized for detection and/or diagnosis of SARS-CoV-2 by FDA under an Emergency Use Authorization (EUA). This EUA will remain in effect (meaning this test can be used) for the duration of the COVID-19 declaration under Section 564(b)(1) of the Act, 21 U.S.C. section 360bbb-3(b)(1), unless the authorization is terminated or revoked.  Performed at [facility], [place] [address], [place], [place] [address]   Respiratory (~20 pathogens) panel by PCR     Status: None   Collection Time: 07/25/22  6:15 PM   Specimen: Nasopharyngeal Swab; Respiratory  Result Value Ref Range Status   Adenovirus NOT DETECTED NOT DETECTED Final   Coronavirus 229E NOT DETECTED NOT DETECTED Final    Comment: (NOTE) The Coronavirus on the Respiratory Panel, DOES NOT test for the novel  Coronavirus (2019 nCoV)    Coronavirus HKU1 NOT DETECTED NOT DETECTED Final   Coronavirus NL63 NOT DETECTED NOT DETECTED Final   Coronavirus OC43 NOT DETECTED NOT DETECTED Final   Metapneumovirus NOT DETECTED NOT DETECTED Final    Rhinovirus / Enterovirus NOT DETECTED NOT DETECTED Final   Influenza A NOT DETECTED NOT DETECTED Final   Influenza B NOT DETECTED NOT DETECTED Final   Parainfluenza Virus 1 NOT DETECTED NOT DETECTED Final   Parainfluenza Virus 2 NOT DETECTED NOT DETECTED Final   Parainfluenza Virus 3 NOT DETECTED NOT DETECTED Final   Parainfluenza Virus 4 NOT DETECTED NOT DETECTED Final   Respiratory Syncytial Virus NOT DETECTED NOT DETECTED Final   Bordetella pertussis NOT DETECTED NOT DETECTED Final   Bordetella Parapertussis NOT DETECTED NOT DETECTED Final   Chlamydophila pneumoniae NOT DETECTED NOT DETECTED Final   Mycoplasma pneumoniae NOT DETECTED NOT DETECTED Final    Comment: Performed at [facility], [address], [place], [place] [address]     Radiology Studies: No results found.   Scheduled Meds:  apixaban  5 mg Oral BID   ARIPiprazole  5 mg Oral Daily   atorvastatin  40 mg Oral QHS   budesonide (PULMICORT) nebulizer  solution  0.25 mg Nebulization BID   Chlorhexidine Gluconate Cloth  6 each Topical Daily   clonazePAM  0.25 mg Oral BID   dapagliflozin propanediol  10 mg Oral Daily   doxepin  100 mg Oral QHS   FLUoxetine  40 mg Oral Daily   insulin aspart  0-9 Units Subcutaneous TID WC   insulin detemir  15 Units Subcutaneous Daily   ipratropium-albuterol  3 mL Nebulization TID   methylPREDNISolone (SOLU-MEDROL) injection  125 mg Intravenous Daily   midodrine  5 mg Oral TID WC   nicotine  7 mg Transdermal Daily   nystatin  5 mL Oral QID   mouth rinse  15 mL Mouth Rinse 4 times per day   pantoprazole  40 mg Oral Daily   sodium chloride flush  3 mL Intravenous Q12H   sodium chloride flush  3 mL Intravenous Q12H   spironolactone  12.5 mg Oral Daily   torsemide  20 mg Oral Daily   Continuous Infusions:  sodium chloride       LOS: 8 days    Time spent: 27mn  PDomenic Polite MD [facility]   07/31/2022, 11:20 AM

## 2022-07-31 NOTE — Progress Notes (Signed)
Cardiology Rounding note  I briefly examined the patient. She has trace dependent edema, no JVD.  Would continue diuretics as needed to maintain current weight and I=O.

## 2022-07-31 NOTE — Progress Notes (Signed)
   NAME:  [name], MRN:  [id number], DOB:  Oct 16, 1960, LOS: 8 ADMISSION DATE:  07/23/2022, CONSULTATION DATE: 07/28/2022 REFERRING MD: [facility], CHIEF COMPLAINT: Refractory hypoxia  History of Present Illness:  61 year old lifelong smoker who was seen by our practice in 2014 with recommendations to quit smoking following surgery of the abdomen.  He now presents after being noncompliant with medications and not seeking medical help until she was extremely edematous and short of breath.  She has been aggressively diuresed to 12 L and reports she is breathing better.  Her weight on admission was 228 pounds and her weight today 07/28/2022 203 pounds.  She has been aggressively diuresed with improvement.  CT scan does show groundglass opacities.  2D echo reveals RV dysfunction status post right heart cath with RV dysfunction.  Pulmonary critical care asked to evaluate groundglass disease for aggressive diuresis questionable role for steroids to follow.  Pertinent  Medical History   Past Medical History:  Diagnosis Date   Acid reflux disease    Anxiety    Asthma    Bronchitis, chronic (HCC)    Effects worse with URI   Chronic pain syndrome    COPD (chronic obstructive pulmonary disease) ([place])    Depression    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, type 2   Diverticulitis    Dysplasia of cervix (uteri)    patient states she had dysplasia of the uterus stage 4   Endometrial cancer (HCC)    Fibromyalgia    Fibromyalgia    GERD (gastroesophageal reflux disease)    Headache(784.0)    migraine headache   Hyperlipidemia    Hypertension    Kidney stones    at least 6 stones in past   Pneumonia 2019   Restless leg syndrome    Bilateral   Vomiting    last night at 2300 after last round of antibiotic     Significant Hospital Events: Including procedures, antibiotic start and stop dates in addition to other pertinent events   07/29/2022 started on steroids  Interim  History / Subjective:  Feeling better. O2 needs improving.  Objective   Blood pressure 124/68, pulse 78, temperature 97.8 F (36.6 C), temperature source Oral, resp. rate 18, height '5\' 6"'$  (1.676 m), weight 88.7 kg, last menstrual period 04/20/2013, SpO2 (!) 88 %.        Intake/Output Summary (Last 24 hours) at 07/31/2022 1401 Last data filed at 07/31/2022 1350 Gross per 24 hour  Intake 720 ml  Output 1950 ml  Net -1230 ml    Filed Weights   07/29/22 0455 07/30/22 0426 07/31/22 0546  Weight: 93.2 kg 91 kg 88.7 kg    Examination: No distress Still with crackles Less dyspneic Abd soft No edema  Resolved Hospital Problem list     Assessment & Plan:  Some sort of inflammatory pneumonitis on top of volume overload responding to steroids.   Smoker so maybe a DIP/RBILD spectrum.  RV failure with improved volume status; PVR on 4.4  - Continue IV steroids through weekend - Wean O2 for sats >90% (baseline no O2) - GDMT per CHF team - Will see again Monday to (hopefully) transition to PO steroids  Erskine Emery MD PCCM

## 2022-08-01 LAB — BASIC METABOLIC PANEL
Anion gap: 11 (ref 5–15)
BUN: 45 mg/dL — ABNORMAL HIGH (ref 8–23)
CO2: 29 mmol/L (ref 22–32)
Calcium: 9.8 mg/dL (ref 8.9–10.3)
Chloride: 94 mmol/L — ABNORMAL LOW (ref 98–111)
Creatinine, Ser: 1.28 mg/dL — ABNORMAL HIGH (ref 0.44–1.00)
GFR, Estimated: 47 mL/min — ABNORMAL LOW (ref >60)
Glucose, Bld: 258 mg/dL — ABNORMAL HIGH (ref 70–99)
Potassium: 4.7 mmol/L (ref 3.5–5.1)
Sodium: 134 mmol/L — ABNORMAL LOW (ref 135–145)

## 2022-08-01 LAB — GLUCOSE, CAPILLARY
Glucose-Capillary: 164 mg/dL — ABNORMAL HIGH (ref 70–99)
Glucose-Capillary: 183 mg/dL — ABNORMAL HIGH (ref 70–99)
Glucose-Capillary: 322 mg/dL — ABNORMAL HIGH (ref 70–99)
Glucose-Capillary: 202 mg/dL — ABNORMAL HIGH (ref 70–99)

## 2022-08-01 MED ORDER — INSULIN DETEMIR 100 UNIT/ML ~~LOC~~ SOLN
20.0000 [IU] | Freq: Every day | SUBCUTANEOUS | Status: DC
  Administered 2022-08-01 – 2022-08-04 (×4): 20 [IU] via SUBCUTANEOUS
  Filled 2022-08-01 (×4): qty 0.2

## 2022-08-01 MED ORDER — SALINE SPRAY 0.65 % NA SOLN
1.0000 | NASAL | Status: DC | PRN
  Filled 2022-08-01: qty 44

## 2022-08-01 MED ORDER — GUAIFENESIN-DM 100-10 MG/5ML PO SYRP
15.0000 mL | ORAL_SOLUTION | ORAL | Status: DC | PRN
  Administered 2022-08-01: 15 mL via ORAL
  Filled 2022-08-01: qty 15

## 2022-08-01 NOTE — Progress Notes (Signed)
PROGRESS NOTE    [name]  YWV:[id number] DOB: 1961/08/19 DOA: 07/23/2022 PCP: Ginger Organ., MD  61 y/o female active smoker w/ h/o COPD, HTN, HLD, Type 2DM and chronic pain syndrome/ fibromyalgia, who presented to ED on 11/3 w/ complains of 3 wk h/o progressive SOB, cough and wheezing,   In ED, found to be in respiratory distress and tachypneic, BNP 905. Creat 2.6 CXR w/ mild diffuse interstitial densities bilaterally c/w pulmonary edema vs possible atypical inflammation.  2D Echo showed severe RV failure.  LVEF 60-65%, GIDD, severely reduced RV systolic function -Diuresed with IV Lasix, advanced heart failure team following, still remains profoundly hypoxic -HRCT 11/7: New mid and upper lung groundglass opacities favored to represent CHF, mediastinal adenopathy noted as well -11/8: Pulmonary consulted -11/9 started pulse IV steroids  Subjective: -Feels better overall, breathing continues to improve, ambulating  Assessment and Plan:  Acute Hypoxic resp failure -multifactorial, CHF, with underlying COPD,?  ILD flare -VQ scan negative for PE -diuresed with IV lasix, -12 L, oxygen requirement remains high-unchanged -HRCT 4/23, concerning for emphysema and interstitial fibrosis, still smoking -Repeat CT chest 11/7 with new upper and middle lobe groundglass opacities favored to represent CHF, but wedge pressure is down and appears adequately diuresed, neg 13.2 L, po torsemide  -Resp viral panel, flu and Covid neg -appreciate pulmonary input, ESR and CRP is high,  -Started high-dose pulse steroids 11/9 for possible ILD flare-slowly improving -Continue pulm toilet, incentive spirometry, increase activity -Continue to increase activity and wean O2, down to 6L this morning   Acute on chronic diastolic CHF RV failure, Cor Pulmonale Pulm HTN -Echo preserved LV systolic function EF 60 to 85%,, RV systolic function with severe reduction, -RHC 11/6 showed R>>L heart failure, c  low wedge and elevated right heart pressures suggesting cor pulmonale -diuresed w/ IV lasix, 13.2 L neg, now on oral torsemide, Aldactone, Jardiance -Heart failure team following -remains on midodrine   Acute exacerbation of COPD with asthma ([place]) -on bronchodilator therapy   P.Afib -in NSR, continue eliquis   AKI  Hypokalemia  -creat 2.6 on admission, cardiorenal, improving now 1.2   Cirrhosis -suspected on RUQ US-11/3 -could be from RV failure, check hepatitis C   HIV positive screen-lab error -HIV viral RNA not detected, confirmatory test negative   Essential hypertension Continue blood pressure support with midodrine.    Acid reflux disease Continue acid suppressive therapy.    Type 2 diabetes mellitus with hyperlipidemia (HCC) -CBGs stable, continue Levemir   Fibromyalgia Depression,  Continue with aripiprazole, clonazepam    Class 2 obesity Calculated BMI is 36.3      DVT prophylaxis:apixaban Code Status: FUll Code Family Communication: None present Disposition: Home pending improvement in hypoxia   Consultants: Cards and pulmonary  Objective: Vitals:   08/01/22 0724 08/01/22 0807 08/01/22 0822 08/01/22 0846  BP:  (!) 104/56  (!) 107/57  Pulse:  81  87  Resp:  20  18  Temp:  98.1 F (36.7 C)  97.6 F (36.4 C)  TempSrc:  Oral  Oral  SpO2: 96% 91% 90% 93%  Weight:      Height:        Intake/Output Summary (Last 24 hours) at 08/01/2022 1038 Last data filed at 08/01/2022 0815 Gross per 24 hour  Intake 360 ml  Output 1600 ml  Net -1240 ml   Filed Weights   07/30/22 0426 07/31/22 0546 08/01/22 0415  Weight: 91 kg 88.7 kg 88.5 kg  Examination:  General exam: Obese chronically ill female sitting up in bed, AAOx3, no distress HEENT: Neck no JVD CVS: S1-S2, regular rhythm Lungs: Few scattered rales Abdomen: Soft, nontender, bowel sounds present Extremities: No edema Skin: No rashes Psychiatry:  Mood & affect appropriate.     Data  Reviewed:   CBC: Recent Labs  Lab 07/26/22 1633 07/27/22 0059 07/28/22 0121 07/29/22 0055 07/30/22 0038  WBC  --  5.9 8.8 9.0 7.3  HGB 12.2  11.9* 10.7* 11.6* 11.6* 12.0  HCT 36.0  35.0* 35.0* 35.7* 37.4 39.2  MCV  --  89.3 87.3 88.0 89.5  PLT  --  273 247 280 518   Basic Metabolic Panel: Recent Labs  Lab 07/27/22 0059 07/28/22 0121 07/29/22 0055 07/30/22 0038 07/31/22 0029 08/01/22 0020  NA 139 136 138 136 135 134*  K 4.2 4.2 4.3 4.9 4.1 4.7  CL 98 97* 98 96* 95* 94*  CO2 _0 GLUCOSE 117* 136* 152* 214* 233* 258*  BUN 36* 34* 31* 29* 40* 45*  CREATININE 1.16* 1.16* 1.36* 1.11* 1.16* 1.28*  CALCIUM 8.9 8.9 9.1 9.3 9.4 9.8  MG 1.8  --   --   --   --   --    GFR: Estimated Creatinine Clearance: 50.4 mL/min (A) (by C-G formula based on SCr of 1.28 mg/dL (H)). Liver Function Tests: No results for input(s): "AST", "ALT", "ALKPHOS", "BILITOT", "PROT", "ALBUMIN" in the last 168 hours.  No results for input(s): "LIPASE", "AMYLASE" in the last 168 hours. No results for input(s): "AMMONIA" in the last 168 hours.  Coagulation Profile: No results for input(s): "INR", "PROTIME" in the last 168 hours.  Cardiac Enzymes: No results for input(s): "CKTOTAL", "CKMB", "CKMBINDEX", "TROPONINI" in the last 168 hours. BNP (last 3 results) No results for input(s): "PROBNP" in the last 8760 hours. HbA1C: No results for input(s): "HGBA1C" in the last 72 hours. CBG: Recent Labs  Lab 07/31/22 0556 07/31/22 1102 07/31/22 1630 07/31/22 2110 08/01/22 0604  GLUCAP 168* 184* 246* 293* 164*   Lipid Profile: No results for input(s): "CHOL", "HDL", "LDLCALC", "TRIG", "CHOLHDL", "LDLDIRECT" in the last 72 hours. Thyroid Function Tests: No results for input(s): "TSH", "T4TOTAL", "FREET4", "T3FREE", "THYROIDAB" in the last 72 hours. Anemia Panel: No results for input(s): "VITAMINB12", "FOLATE", "FERRITIN", "TIBC", "IRON", "RETICCTPCT" in the last 72 hours. Urine  analysis:    Component Value Date/Time   COLORURINE YELLOW 11/22/2019 1351   APPEARANCEUR CLEAR 11/22/2019 1351   LABSPEC 1.008 11/22/2019 1351   PHURINE 5.0 11/22/2019 1351   GLUCOSEU NEGATIVE 11/22/2019 1351   HGBUR SMALL (A) 11/22/2019 1351   BILIRUBINUR NEGATIVE 11/22/2019 1351   KETONESUR NEGATIVE 11/22/2019 1351   PROTEINUR NEGATIVE 11/22/2019 1351   UROBILINOGEN 0.2 11/02/2012 1530   NITRITE NEGATIVE 11/22/2019 1351   LEUKOCYTESUR NEGATIVE 11/22/2019 1351   Sepsis Labs: _1 (procalcitonin:4,lacticidven:4)  ) Recent Results (from the past 240 hour(s))  Resp Panel by RT-PCR (Flu A&B, Covid) Anterior Nasal Swab     Status: None   Collection Time: 07/23/22  9:52 AM   Specimen: Anterior Nasal Swab  Result Value Ref Range Status   SARS Coronavirus 2 by RT PCR NEGATIVE NEGATIVE Final    Comment: (NOTE) SARS-CoV-2 target nucleic acids are NOT DETECTED.  The SARS-CoV-2 RNA is generally detectable in upper respiratory specimens during the acute phase of infection. The lowest concentration of SARS-CoV-2 viral copies this assay can detect is 138 copies/mL. A negative result does not preclude SARS-Cov-2  infection and should not be used as the sole basis for treatment or other patient management decisions. A negative result may occur with  improper specimen collection/handling, submission of specimen other than nasopharyngeal swab, presence of viral mutation(s) within the areas targeted by this assay, and inadequate number of viral copies(<138 copies/mL). A negative result must be combined with clinical observations, patient history, and epidemiological information. The expected result is Negative.  Fact Sheet for Patients:  EntrepreneurPulse.com.au  Fact Sheet for Healthcare Providers:  IncredibleEmployment.be  This test is no t yet approved or cleared by the [place] FDA and  has been authorized for detection and/or diagnosis  of SARS-CoV-2 by FDA under an Emergency Use Authorization (EUA). This EUA will remain  in effect (meaning this test can be used) for the duration of the COVID-19 declaration under Section 564(b)(1) of the Act, 21 U.S.C.section 360bbb-3(b)(1), unless the authorization is terminated  or revoked sooner.       Influenza A by PCR NEGATIVE NEGATIVE Final   Influenza B by PCR NEGATIVE NEGATIVE Final    Comment: (NOTE) The Xpert Xpress SARS-CoV-2/FLU/RSV plus assay is intended as an aid in the diagnosis of influenza from Nasopharyngeal swab specimens and should not be used as a sole basis for treatment. Nasal washings and aspirates are unacceptable for Xpert Xpress SARS-CoV-2/FLU/RSV testing.  Fact Sheet for Patients: EntrepreneurPulse.com.au  Fact Sheet for Healthcare Providers: IncredibleEmployment.be  This test is not yet approved or cleared by the [place] FDA and has been authorized for detection and/or diagnosis of SARS-CoV-2 by FDA under an Emergency Use Authorization (EUA). This EUA will remain in effect (meaning this test can be used) for the duration of the COVID-19 declaration under Section 564(b)(1) of the Act, 21 U.S.C. section 360bbb-3(b)(1), unless the authorization is terminated or revoked.  Performed at [facility], [place] [address], [place], [place] [address]   Respiratory (~20 pathogens) panel by PCR     Status: None   Collection Time: 07/25/22  6:15 PM   Specimen: Nasopharyngeal Swab; Respiratory  Result Value Ref Range Status   Adenovirus NOT DETECTED NOT DETECTED Final   Coronavirus 229E NOT DETECTED NOT DETECTED Final    Comment: (NOTE) The Coronavirus on the Respiratory Panel, DOES NOT test for the novel  Coronavirus (2019 nCoV)    Coronavirus HKU1 NOT DETECTED NOT DETECTED Final   Coronavirus NL63 NOT DETECTED NOT DETECTED Final   Coronavirus OC43 NOT DETECTED NOT DETECTED Final   Metapneumovirus NOT  DETECTED NOT DETECTED Final   Rhinovirus / Enterovirus NOT DETECTED NOT DETECTED Final   Influenza A NOT DETECTED NOT DETECTED Final   Influenza B NOT DETECTED NOT DETECTED Final   Parainfluenza Virus 1 NOT DETECTED NOT DETECTED Final   Parainfluenza Virus 2 NOT DETECTED NOT DETECTED Final   Parainfluenza Virus 3 NOT DETECTED NOT DETECTED Final   Parainfluenza Virus 4 NOT DETECTED NOT DETECTED Final   Respiratory Syncytial Virus NOT DETECTED NOT DETECTED Final   Bordetella pertussis NOT DETECTED NOT DETECTED Final   Bordetella Parapertussis NOT DETECTED NOT DETECTED Final   Chlamydophila pneumoniae NOT DETECTED NOT DETECTED Final   Mycoplasma pneumoniae NOT DETECTED NOT DETECTED Final    Comment: Performed at [facility], [address], [place], [place] [address]     Radiology Studies: No results found.   Scheduled Meds:  apixaban  5 mg Oral BID   ARIPiprazole  5 mg Oral Daily   atorvastatin  40 mg Oral QHS  budesonide (PULMICORT) nebulizer solution  0.25 mg Nebulization BID   Chlorhexidine Gluconate Cloth  6 each Topical Daily   clonazePAM  0.25 mg Oral BID   dapagliflozin propanediol  10 mg Oral Daily   doxepin  100 mg Oral QHS   FLUoxetine  40 mg Oral Daily   insulin aspart  0-9 Units Subcutaneous TID WC   insulin detemir  20 Units Subcutaneous Daily   ipratropium-albuterol  3 mL Nebulization BID   methylPREDNISolone (SOLU-MEDROL) injection  125 mg Intravenous Daily   midodrine  5 mg Oral TID WC   nicotine  7 mg Transdermal Daily   nystatin  5 mL Oral QID   mouth rinse  15 mL Mouth Rinse 4 times per day   pantoprazole  40 mg Oral Daily   sodium chloride flush  3 mL Intravenous Q12H   sodium chloride flush  3 mL Intravenous Q12H   spironolactone  12.5 mg Oral Daily   torsemide  20 mg Oral Daily   Continuous Infusions:  sodium chloride       LOS: 9 days    Time spent: 19mn  PDomenic Polite MD [facility]   08/01/2022, 10:38 AM

## 2022-08-01 NOTE — Care Management (Signed)
Requested by Ardine [employer] to get ABG for NIV qualifications. Informed Dr Broadus John, who states she will order one with AM labs.

## 2022-08-01 NOTE — Progress Notes (Signed)
Mobility Specialist Progress Note:   08/01/22 1251  Mobility  Activity Ambulated with assistance in hallway  Level of Assistance Standby assist, set-up cues, supervision of patient - no hands on  Assistive Device Four wheel walker  Distance Ambulated (ft) 150 ft  Activity Response Tolerated well  Mobility Referral Yes  $Mobility charge 1 Mobility   Pre-mobility: 93% SpO2 on 6LO2 During mobility: 86% SpO2 on 6LO2 Post-mobility: 90% SpO2 on 6LO2  Pt received sitting EOB and agreeable. Pt's SpO2 dropped rapidly to 86% on 6LO2 during ambulation, coached on pursed lip breathing until SpO2 returned to acceptable range. Pt left sitting EOB on 6LO2 with all needs met and call bell in reach. RN aware.   Andrey Campanile Mobility Specialist Please contact via SecureChat or  Rehab office at [phone]

## 2022-08-01 NOTE — Progress Notes (Signed)
Patient bipap is on hold to see if patient is able to tolerate with out it per MD

## 2022-08-02 ENCOUNTER — Telehealth: Payer: Self-pay | Admitting: Physician Assistant

## 2022-08-02 DIAGNOSIS — I5033 Acute on chronic diastolic (congestive) heart failure: Secondary | ICD-10-CM | POA: Diagnosis not present

## 2022-08-02 LAB — BLOOD GAS, ARTERIAL
Acid-Base Excess: 10.6 mmol/L — ABNORMAL HIGH (ref 0.0–2.0)
Bicarbonate: 35.7 mmol/L — ABNORMAL HIGH (ref 20.0–28.0)
Drawn by: 24487
O2 Saturation: 100 %
Patient temperature: 35.3
pCO2 arterial: 45 mmHg (ref 32–48)
pH, Arterial: 7.51 — ABNORMAL HIGH (ref 7.35–7.45)
pO2, Arterial: 108 mmHg (ref 83–108)

## 2022-08-02 LAB — BASIC METABOLIC PANEL
Anion gap: 12 (ref 5–15)
BUN: 47 mg/dL — ABNORMAL HIGH (ref 8–23)
CO2: 29 mmol/L (ref 22–32)
Calcium: 9.2 mg/dL (ref 8.9–10.3)
Chloride: 92 mmol/L — ABNORMAL LOW (ref 98–111)
Creatinine, Ser: 1.08 mg/dL — ABNORMAL HIGH (ref 0.44–1.00)
GFR, Estimated: 58 mL/min — ABNORMAL LOW (ref >60)
Glucose, Bld: 194 mg/dL — ABNORMAL HIGH (ref 70–99)
Potassium: 4 mmol/L (ref 3.5–5.1)
Sodium: 133 mmol/L — ABNORMAL LOW (ref 135–145)

## 2022-08-02 LAB — GLUCOSE, CAPILLARY
Glucose-Capillary: 121 mg/dL — ABNORMAL HIGH (ref 70–99)
Glucose-Capillary: 265 mg/dL — ABNORMAL HIGH (ref 70–99)
Glucose-Capillary: 321 mg/dL — ABNORMAL HIGH (ref 70–99)
Glucose-Capillary: 197 mg/dL — ABNORMAL HIGH (ref 70–99)

## 2022-08-02 MED ORDER — PREDNISONE 20 MG PO TABS
40.0000 mg | ORAL_TABLET | Freq: Every day | ORAL | Status: DC
  Administered 2022-08-03 – 2022-08-04 (×2): 40 mg via ORAL
  Filled 2022-08-02 (×2): qty 2

## 2022-08-02 NOTE — Progress Notes (Signed)
NAME:  [name], MRN:  [id number], DOB:  01/06/1961, LOS: 29 ADMISSION DATE:  07/23/2022, CONSULTATION DATE: 07/28/2022 REFERRING MD: [facility], CHIEF COMPLAINT: Refractory hypoxia  History of Present Illness:  61 year old lifelong smoker who was seen by our practice in 2014 with recommendations to quit smoking following surgery of the abdomen.  He now presents after being noncompliant with medications and not seeking medical help until she was extremely edematous and short of breath.  She has been aggressively diuresed to 12 L and reports she is breathing better.  Her weight on admission was 228 pounds and her weight today 07/28/2022 203 pounds.  She has been aggressively diuresed with improvement.  CT scan does show groundglass opacities.  2D echo reveals RV dysfunction status post right heart cath with RV dysfunction.  Pulmonary critical care asked to evaluate groundglass disease for aggressive diuresis questionable role for steroids to follow.  Pertinent  Medical History   Past Medical History:  Diagnosis Date   Acid reflux disease    Anxiety    Asthma    Bronchitis, chronic (HCC)    Effects worse with URI   Chronic pain syndrome    COPD (chronic obstructive pulmonary disease) ([place])    Depression    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, type 2   Diverticulitis    Dysplasia of cervix (uteri)    patient states she had dysplasia of the uterus stage 4   Endometrial cancer (HCC)    Fibromyalgia    Fibromyalgia    GERD (gastroesophageal reflux disease)    Headache(784.0)    migraine headache   Hyperlipidemia    Hypertension    Kidney stones    at least 6 stones in past   Pneumonia 2019   Restless leg syndrome    Bilateral   Vomiting    last night at 2300 after last round of antibiotic     Significant Hospital Events: Including procedures, antibiotic start and stop dates in addition to other pertinent events   07/29/2022 started on steroids 11/13 No  overnight events, feeling better, down to 7L O2  Interim History / Subjective:  Feeling better. O2 needs improving.  Objective   Blood pressure 117/70, pulse 81, temperature 97.8 F (36.6 C), temperature source Oral, resp. rate 18, height '5\' 6"'$  (1.676 m), weight 88 kg, last menstrual period 04/20/2013, SpO2 96 %.        Intake/Output Summary (Last 24 hours) at 08/02/2022 0802 Last data filed at 08/02/2022 2751 Gross per 24 hour  Intake 120 ml  Output 2501 ml  Net -2381 ml    Filed Weights   07/31/22 0546 08/01/22 0415 08/02/22 0447  Weight: 88.7 kg 88.5 kg 88 kg    General:  chronically ill-appearing F, sitting up in chair in no distress HEENT: MM pink/moist Neuro: awake, alert, responding to questions appropriately CV: s1s2 rrr, no m/r/g PULM:  mild expiratory wheeze GI: soft, bsx4 active  Extremities: warm/dry, no edema  Skin: no rashes or lesions   Resolved Hospital Problem list     Assessment & Plan:   Acute Hypoxic Respiratory Failure  Likely multi-factorial and secondary to CHF, COPD and some sort of inflammatory pneumonitis on top of volume overload responding to steroids.   Smoker so maybe a DIP/RBILD spectrum. RV failure with improved volume status; PVR on 4.4  - improving, transition from IV solumedrol to prednisone '40mg'$  qd today - Wean O2 for sats >90% (baseline no O2) - GDMT per  CHF team - will need outpatient follow up with [facility], will request appointment through [facility] Ashford Clouse, PA-C [facility] See Amion for pager If no response to pager , please call [phone] until 7pm After 7:00 pm call Elink  [phone]

## 2022-08-02 NOTE — Progress Notes (Addendum)
Patient ID: [name], female   DOB: 26-Mar-1961, 61 y.o.   MRN: [id number]     Advanced Heart Failure Rounding Note  PCP-Cardiologist: None   Subjective:     Remains on steroids. Still SOB. On 7L [place]. No other complaints. Euvolemic on exam.    Echo EF 60-65% RV reduced.    RHC Procedural Findings: Hemodynamics (mmHg) RA mean 11 RV 45/15 PA 44/19, mean 28 PCWP mean 4 Oxygen saturations: PA 52% AO 91% Cardiac Output (Fick) 4.69  Cardiac Index (Fick) 2.28 PVR 5.1 WU Cardiac Output (thermo) 5.46 Cardiac Index (thermo) 2.65  PVR 4.4 WU PAPI 2.3   Objective:   Weight Range: 88 kg Body mass index is 31.33 kg/m.   Vital Signs:   Temp:  [97.4 F (36.3 C)-97.9 F (36.6 C)] 97.8 F (36.6 C) (11/13 0731) Pulse Rate:  [79-90] 81 (11/13 0731) Resp:  [18-22] 18 (11/13 0731) BP: (107-126)/(49-70) 117/70 (11/13 0731) SpO2:  [90 %-97 %] 96 % (11/13 0832) Weight:  [88 kg] 88 kg (11/13 0447) Last BM Date : 07/30/22  Weight change: Filed Weights   07/31/22 0546 08/01/22 0415 08/02/22 0447  Weight: 88.7 kg 88.5 kg 88 kg    Intake/Output:   Intake/Output Summary (Last 24 hours) at 08/02/2022 0844 Last data filed at 08/02/2022 0733 Gross per 24 hour  Intake 0 ml  Output 2001 ml  Net -2001 ml      Physical Exam   General:  Well appearing. No respiratory difficulty HEENT: normal Neck: supple. no JVD. Carotids 2+ bilat; no bruits. No lymphadenopathy or thyromegaly appreciated. Cor: PMI nondisplaced. Regular rate & rhythm. No rubs, gallops or murmurs. Lungs: clear Abdomen: soft, nontender, nondistended. No hepatosplenomegaly. No bruits or masses. Good bowel sounds. Extremities: no cyanosis, clubbing, rash, edema Neuro: alert & oriented x 3, cranial nerves grossly intact. moves all 4 extremities w/o difficulty. Affect pleasant.  Telemetry   NSR 90s   EKG    N/A  Labs    CBC No results for input(s): "WBC", "NEUTROABS", "HGB", "HCT", "MCV", "PLT" in  the last 72 hours.  Basic Metabolic Panel Recent Labs    08/01/22 0020 08/02/22 0045  NA 134* 133*  K 4.7 4.0  CL 94* 92*  CO2 29 29  GLUCOSE 258* 194*  BUN 45* 47*  CREATININE 1.28* 1.08*  CALCIUM 9.8 9.2   Liver Function Tests No results for input(s): "AST", "ALT", "ALKPHOS", "BILITOT", "PROT", "ALBUMIN" in the last 72 hours. No results for input(s): "LIPASE", "AMYLASE" in the last 72 hours. Cardiac Enzymes No results for input(s): "CKTOTAL", "CKMB", "CKMBINDEX", "TROPONINI" in the last 72 hours.  BNP: BNP (last 3 results) Recent Labs    07/23/22 0952 07/28/22 0121  BNP 905.0* 625.8*    ProBNP (last 3 results) No results for input(s): "PROBNP" in the last 8760 hours.   D-Dimer No results for input(s): "DDIMER" in the last 72 hours. Hemoglobin A1C No results for input(s): "HGBA1C" in the last 72 hours. Fasting Lipid Panel No results for input(s): "CHOL", "HDL", "LDLCALC", "TRIG", "CHOLHDL", "LDLDIRECT" in the last 72 hours. Thyroid Function Tests No results for input(s): "TSH", "T4TOTAL", "T3FREE", "THYROIDAB" in the last 72 hours.  Invalid input(s): "FREET3"  Other results:   Imaging    No results found.   Medications:     Scheduled Medications:  apixaban  5 mg Oral BID   ARIPiprazole  5 mg Oral Daily   atorvastatin  40 mg Oral QHS   budesonide (PULMICORT) nebulizer solution  0.25 mg Nebulization BID   Chlorhexidine Gluconate Cloth  6 each Topical Daily   clonazePAM  0.25 mg Oral BID   dapagliflozin propanediol  10 mg Oral Daily   doxepin  100 mg Oral QHS   FLUoxetine  40 mg Oral Daily   insulin aspart  0-9 Units Subcutaneous TID WC   insulin detemir  20 Units Subcutaneous Daily   ipratropium-albuterol  3 mL Nebulization BID   methylPREDNISolone (SOLU-MEDROL) injection  125 mg Intravenous Daily   midodrine  5 mg Oral TID WC   nicotine  7 mg Transdermal Daily   nystatin  5 mL Oral QID   mouth rinse  15 mL Mouth Rinse 4 times per day    pantoprazole  40 mg Oral Daily   sodium chloride flush  3 mL Intravenous Q12H   sodium chloride flush  3 mL Intravenous Q12H   spironolactone  12.5 mg Oral Daily   torsemide  20 mg Oral Daily    Infusions:  sodium chloride      PRN Medications: sodium chloride, acetaminophen, dicyclomine, guaiFENesin-dextromethorphan, ipratropium-albuterol, ondansetron (ZOFRAN) IV, mouth rinse, sodium chloride, sodium chloride flush   Assessment/Plan    1. Acute hypoxemic respiratory failure: Now on 12 L oxygen.  Has COPD, but does not wear oxygen at home. She was smoking up to this admission.  Suspect combination of AECOPD (emphysema, smoking-related ILD) and RV failure/cor pulmonale.  No evidence for PE by V/Q scan. RHC with low PCWP and elevated right heart pressures, suggesting cor pulmonale.  Given that she still has significant oxygen requirement, it appears that her lung parenchymal disease is quite significant.  - Pulmonary appreciated. Sed rate 95, CRP 18 - Started on empiric steroids by [facility] 11/09. Appreciate their assistance  2. RV failure/cor pulmonale: Echo showed EF 60-65%, D-shaped septum, moderate RV enlargement with severely decreased RV systolic function, IVC dilated. RHC with low PCWP, elevated right-sided filling pressures, mild PAH, preserved cardiac output. Suspect RV failure due to parenchymal lung disease (emphysema, smoking-related ILD noted on HRCT 4/23).  She appears well-diuresed at this point with weight significantly down. Creatinine stable.  - Volume status stable. Continue torsemide 20 mg daily.   - Continue spironolactone 12.5 daily.  - Continue Farxiga 10 mg daily.   - Continue midodrine 5 mg tid, SBP stable 90s-100s - Renal function stable.   3. 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  HRCT 4/23 showed emphysema and smoking-related ILD.  V/Q scan did not show evidence for PE.  - Does not appear to need further diuresis - Continue  supplemental O2. Notes she did not require supplemental O2 prior to admission 4. COPD: Emphysema on chest imaging.  Active smoker at admission.  - Needs to quit smoking.  5. Atrial fibrillation: Paroxysmal.   -In SR currently - Continue  Eliquis.  Co-Pay $45.00. She will need 30 day free card.  6. AKI: Creatinine 2.6 at admission, down to 1.08 today. Follow closely, suspect cardiorenal syndrome.  7. Cirrhosis: Possible cirrhosis by RUQ [place].  This may be due to RV failure.  8. 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  9. Chest pain: Resolved.  No known CAD but history of smoking. HS-TnI mildly elevated with no trend at admission but thought to be demand ischemia from hypoxemia and volume overload.   - EF 60-65% RV severely reduced.  No PE on VQ  scan 11/6 - HS Trop no trend   Length of Stay:  [place], PA-C  08/02/2022, 8:44 AM  Advanced Heart Failure Team Pager [phone] (M-F; 7a - 5p)  Please contact [facility] for night-coverage after hours (5p -7a ) and weekends on [email or web address]  Patient seen with PA, agree with the above note.   She says that her breathing is better.  Oxygen requirement is slowly decreasing, down to 7L [place].  She remains on IV Solumedrol.  Weight has been trending down.   General: NAD Neck: No JVD, no thyromegaly or thyroid nodule.  Lungs: Decreased BS bilaterally with rhonchi.  CV: Nondisplaced PMI.  Heart regular S1/S2, no S3/S4, no murmur.  No peripheral edema.   Abdomen: Soft, nontender, no hepatosplenomegaly, no distention.  Skin: Intact without lesions or rashes.  Neurologic: Alert and oriented x 3.  Psych: Normal affect. Extremities: No clubbing or cyanosis.  HEENT: Normal.   By exam, she does not look volume overloaded and weight has trended down.  Would continue current torsemide, spironolactone, and Farxiga.  Creatinine stable at 1.08.   Still with significant oxygen requirement but slowly improving.  On IV  Solumedrol per pulmonary for inflammatory pneumonitis.   - Pulmonary to see today, ?transition to po steroids.   Mobilize.   Loralie Champagne 08/02/2022 9:14 AM

## 2022-08-02 NOTE — Progress Notes (Signed)
Physical Therapy Treatment Patient Details Name: [name] MRN: [id number] DOB: [birth year] Today's Date: 08/02/2022   History of Present Illness Pt is a 61 y.o. F who presents 07/23/2022 with dyspnea and LE edema. Working diagnosis of heart failure/COPD decompensation. Significant PMH: COPD, HTN, dyslipidemia, fibromyalgia, DM2.    PT Comments    The pt was agreeable to session with focus on progressing ambulation endurance. She was able to use rollator with good stability and awareness of safety features, and completed multiple bouts of hallway ambulation on 6L with SpO2 87-93%. (25 ft + 60 ft + 45 ft)  The pt does need seated rest to recover, but is showing good progress in both distance and decreased O2 needs compared to prior session (was on 10-15L O2 for mobility). Will continue to benefit from skilled PT to progress strength and endurance.    Recommendations for follow up therapy are one component of a multi-disciplinary discharge planning process, led by the attending physician.  Recommendations may be updated based on patient status, additional functional criteria and insurance authorization.  Follow Up Recommendations  No PT follow up (pending progress)     Assistance Recommended at Discharge PRN  Patient can return home with the following A little help with walking and/or transfers;Assistance with cooking/housework;Assist for transportation;Help with stairs or ramp for entrance   Equipment Recommendations  Rollator (4 wheels)    Recommendations for Other Services       Precautions / Restrictions Precautions Precautions: Fall;Other (comment) Precaution Comments: watch O2, on 6L this session Restrictions Weight Bearing Restrictions: No     Mobility  Bed Mobility Overal bed mobility: Modified Independent             General bed mobility comments: pt OOB at start and end of session    Transfers Overall transfer level: Needs assistance Equipment used:  Rollator (4 wheels) Transfers: Sit to/from Stand Sit to Stand: Supervision           General transfer comment: supervision for safety. Instructed patient on rollator mechanics and use of brakes prior to sitting or standing    Ambulation/Gait Ambulation/Gait assistance: Supervision Gait Distance (Feet): 25 Feet (+ 60 ft + 45 ft) Assistive device: Rollator (4 wheels) Gait Pattern/deviations: Step-through pattern, Decreased stride length Gait velocity: decreased Gait velocity interpretation: <1.31 ft/sec, indicative of household ambulator   General Gait Details: supervison for safety with use of rollator. cues for initial seated rest break but pt then able to direct as needed for seated or standing rest. SpO2 to low of 87% on 6L with exertion, recovered to 90-93% on 6L with seated rest.     Balance Overall balance assessment: Needs assistance Sitting-balance support: No upper extremity supported, Feet supported Sitting balance-Leahy Scale: Good     Standing balance support: Bilateral upper extremity supported, During functional activity Standing balance-Leahy Scale: Fair Standing balance comment: can stand and step without UE support, poor endurance requiring UE support with gait                            Cognition Arousal/Alertness: Awake/alert Behavior During Therapy: WFL for tasks assessed/performed Overall Cognitive Status: Within Functional Limits for tasks assessed                                          Exercises      General  Comments General comments (skin integrity, edema, etc.): SpO2 to low of 87% on 6L with exertion, recovered to 90-93% on 6L with seated rest.      Pertinent Vitals/Pain Pain Assessment Pain Assessment: No/denies pain Pain Intervention(s): Monitored during session     PT Goals (current goals can now be found in the care plan section) Acute Rehab PT Goals Patient Stated Goal: to improve breathing and return  home PT Goal Formulation: With patient Time For Goal Achievement: 08/08/22 Potential to Achieve Goals: Good Progress towards PT goals: Progressing toward goals    Frequency    Min 3X/week      PT Plan Current plan remains appropriate       AM-PAC PT "6 Clicks" Mobility   Outcome Measure  Help needed turning from your back to your side while in a flat bed without using bedrails?: None Help needed moving from lying on your back to sitting on the side of a flat bed without using bedrails?: None Help needed moving to and from a bed to a chair (including a wheelchair)?: A Little Help needed standing up from a chair using your arms (e.g., wheelchair or bedside chair)?: A Little Help needed to walk in hospital room?: A Little Help needed climbing 3-5 steps with a railing? : A Little 6 Click Score: 20    End of Session Equipment Utilized During Treatment: Oxygen Activity Tolerance: Patient tolerated treatment well Patient left: in chair;with call bell/phone within reach Nurse Communication: Mobility status PT Visit Diagnosis: Unsteadiness on feet (R26.81);Difficulty in walking, not elsewhere classified (R26.2)     Time: 9563-8756 PT Time Calculation (min) (ACUTE ONLY): 17 min  Charges:  $Therapeutic Exercise: 8-22 mins                     West Carbo, PT, DPT   Acute Rehabilitation Department   Sandra Cockayne 08/02/2022, 1:21 PM

## 2022-08-02 NOTE — Progress Notes (Addendum)
PROGRESS NOTE    [name]  UKG:[id number] DOB: [birth year] DOA: 07/23/2022 PCP: Ginger Organ., MD  61 y/o female active smoker w/ h/o COPD, HTN, HLD, Type 2DM and chronic pain syndrome/ fibromyalgia, who presented to ED on 11/3 w/ complains of 3 wk h/o progressive SOB, cough and wheezing,   In ED, found to be in respiratory distress and tachypneic, BNP 905. Creat 2.6 CXR w/ mild diffuse interstitial densities bilaterally c/w pulmonary edema vs possible atypical inflammation.  2D Echo showed severe RV failure.  LVEF 60-65%, GIDD, severely reduced RV systolic function -Diuresed with IV Lasix, advanced heart failure team following, still remains profoundly hypoxic -HRCT 11/7: New mid and upper lung groundglass opacities favored to represent CHF, mediastinal adenopathy noted as well -11/8: Pulmonary consulted -11/9 started pulse IV steroids  Subjective: -Feels fair, breathing continues to improve, ambulating more  Assessment and Plan:  Acute Hypoxic resp failure -multifactorial, CHF, with underlying COPD,?  ILD flare -VQ scan negative for PE -diuresed with IV lasix, -12 L, oxygen requirement remains high-unchanged -HRCT 4/23, concerning for emphysema and interstitial fibrosis, still smoking -Repeat CT chest 11/7 with new upper and middle lobe groundglass opacities favored to represent CHF, but wedge pressure is down adequately diuresed, neg 17 L, po torsemide  -Resp viral panel, flu and Covid neg -appreciate pulmonary input, ESR and CRP is high,  -Started high-dose pulse steroids 11/9 for possible ILD flare-slowly improving, now down to 5-6 L this morning, transition to oral prednisone, taper per pulm -Continue pulm toilet, incentive spirometry, increase activity -Discharge planning needs home BiPAP, TOC following   Acute on chronic diastolic CHF RV failure, Cor Pulmonale Pulm HTN -Echo preserved LV systolic function EF 60 to 76%,, RV systolic function with severe  reduction, -RHC 11/6 showed R>>L heart failure, c low wedge and elevated right heart pressures suggesting cor pulmonale -diuresed w/ IV lasix, 17 L neg, now on oral torsemide, Aldactone, Jardiance -Heart failure team following -remains on midodrine   Acute exacerbation of COPD with asthma ([place]) -on bronchodilator therapy   P.Afib -in NSR, continue eliquis   AKI  Hypokalemia  -creat 2.6 on admission, cardiorenal, improving now 1.2   Cirrhosis -suspected on RUQ US-11/3 -Likely secondary to RV failure   HIV positive screen-lab error -HIV viral RNA not detected, confirmatory test negative   Essential hypertension Continue blood pressure support with midodrine.    Acid reflux disease Continue acid suppressive therapy.    Type 2 diabetes mellitus with hyperlipidemia (HCC) -CBGs stable, continue Levemir   Fibromyalgia Depression,  Continue with aripiprazole, clonazepam    Class 2 obesity Calculated BMI is 36.3      DVT prophylaxis:apixaban Code Status: FUll Code Family Communication: None present Disposition: Home pending improvement in hypoxia   Consultants: Cards and pulmonary  Objective: Vitals:   08/02/22 0433 08/02/22 0447 08/02/22 0731 08/02/22 0832  BP: 126/70  117/70   Pulse: 85  81   Resp: 20  18   Temp: 97.8 F (36.6 C)  97.8 F (36.6 C)   TempSrc: Oral  Oral   SpO2: 90%  96% 96%  Weight:  88 kg    Height:        Intake/Output Summary (Last 24 hours) at 08/02/2022 1108 Last data filed at 08/02/2022 0949 Gross per 24 hour  Intake 240 ml  Output 2001 ml  Net -1761 ml   Filed Weights   07/31/22 0546 08/01/22 0415 08/02/22 0447  Weight: 88.7 kg 88.5 kg 88  kg    Examination:  General exam: Obese chronically ill female sitting up in the recliner, AAOx3, no distress HEENT: No JVD CVS: S1-S2, regular rhythm Lungs: Few scattered rales, improving Abdomen: Soft, nontender, bowel sounds present Extremities: No edema  Skin: No  rashes Psychiatry:  Mood & affect appropriate.     Data Reviewed:   CBC: Recent Labs  Lab 07/26/22 1633 07/27/22 0059 07/28/22 0121 07/29/22 0055 07/30/22 0038  WBC  --  5.9 8.8 9.0 7.3  HGB 12.2  11.9* 10.7* 11.6* 11.6* 12.0  HCT 36.0  35.0* 35.0* 35.7* 37.4 39.2  MCV  --  89.3 87.3 88.0 89.5  PLT  --  273 247 280 751   Basic Metabolic Panel: Recent Labs  Lab 07/27/22 0059 07/28/22 0121 07/29/22 0055 07/30/22 0038 07/31/22 0029 08/01/22 0020 08/02/22 0045  NA 139   < > 138 136 135 134* 133*  K 4.2   < > 4.3 4.9 4.1 4.7 4.0  CL 98   < > 98 96* 95* 94* 92*  CO2 26   < > _0 GLUCOSE 117*   < > 152* 214* 233* 258* 194*  BUN 36*   < > 31* 29* 40* 45* 47*  CREATININE 1.16*   < > 1.36* 1.11* 1.16* 1.28* 1.08*  CALCIUM 8.9   < > 9.1 9.3 9.4 9.8 9.2  MG 1.8  --   --   --   --   --   --    < > = values in this interval not displayed.   GFR: Estimated Creatinine Clearance: 59.6 mL/min (A) (by C-G formula based on SCr of 1.08 mg/dL (H)). Liver Function Tests: No results for input(s): "AST", "ALT", "ALKPHOS", "BILITOT", "PROT", "ALBUMIN" in the last 168 hours.  No results for input(s): "LIPASE", "AMYLASE" in the last 168 hours. No results for input(s): "AMMONIA" in the last 168 hours.  Coagulation Profile: No results for input(s): "INR", "PROTIME" in the last 168 hours.  Cardiac Enzymes: No results for input(s): "CKTOTAL", "CKMB", "CKMBINDEX", "TROPONINI" in the last 168 hours. BNP (last 3 results) No results for input(s): "PROBNP" in the last 8760 hours. HbA1C: No results for input(s): "HGBA1C" in the last 72 hours. CBG: Recent Labs  Lab 08/01/22 0604 08/01/22 1116 08/01/22 1624 08/01/22 2049 08/02/22 0607  GLUCAP 164* 183* 322* 202* 121*   Lipid Profile: No results for input(s): "CHOL", "HDL", "LDLCALC", "TRIG", "CHOLHDL", "LDLDIRECT" in the last 72 hours. Thyroid Function Tests: No results for input(s): "TSH", "T4TOTAL", "FREET4", "T3FREE",  "THYROIDAB" in the last 72 hours. Anemia Panel: No results for input(s): "VITAMINB12", "FOLATE", "FERRITIN", "TIBC", "IRON", "RETICCTPCT" in the last 72 hours. Urine analysis:    Component Value Date/Time   COLORURINE YELLOW 11/22/2019 1351   APPEARANCEUR CLEAR 11/22/2019 1351   LABSPEC 1.008 11/22/2019 1351   PHURINE 5.0 11/22/2019 1351   GLUCOSEU NEGATIVE 11/22/2019 1351   HGBUR SMALL (A) 11/22/2019 1351   BILIRUBINUR NEGATIVE 11/22/2019 1351   KETONESUR NEGATIVE 11/22/2019 1351   PROTEINUR NEGATIVE 11/22/2019 1351   UROBILINOGEN 0.2 11/02/2012 1530   NITRITE NEGATIVE 11/22/2019 1351   LEUKOCYTESUR NEGATIVE 11/22/2019 1351   Sepsis Labs: _1 (procalcitonin:4,lacticidven:4)  ) Recent Results (from the past 240 hour(s))  Respiratory (~20 pathogens) panel by PCR     Status: None   Collection Time: 07/25/22  6:15 PM   Specimen: Nasopharyngeal Swab; Respiratory  Result Value Ref Range Status   Adenovirus NOT DETECTED NOT DETECTED Final   Coronavirus  229E NOT DETECTED NOT DETECTED Final    Comment: (NOTE) The Coronavirus on the Respiratory Panel, DOES NOT test for the novel  Coronavirus (2019 nCoV)    Coronavirus HKU1 NOT DETECTED NOT DETECTED Final   Coronavirus NL63 NOT DETECTED NOT DETECTED Final   Coronavirus OC43 NOT DETECTED NOT DETECTED Final   Metapneumovirus NOT DETECTED NOT DETECTED Final   Rhinovirus / Enterovirus NOT DETECTED NOT DETECTED Final   Influenza A NOT DETECTED NOT DETECTED Final   Influenza B NOT DETECTED NOT DETECTED Final   Parainfluenza Virus 1 NOT DETECTED NOT DETECTED Final   Parainfluenza Virus 2 NOT DETECTED NOT DETECTED Final   Parainfluenza Virus 3 NOT DETECTED NOT DETECTED Final   Parainfluenza Virus 4 NOT DETECTED NOT DETECTED Final   Respiratory Syncytial Virus NOT DETECTED NOT DETECTED Final   Bordetella pertussis NOT DETECTED NOT DETECTED Final   Bordetella Parapertussis NOT DETECTED NOT DETECTED Final   Chlamydophila pneumoniae  NOT DETECTED NOT DETECTED Final   Mycoplasma pneumoniae NOT DETECTED NOT DETECTED Final    Comment: Performed at [facility], [place] [address], [place], [place] [address]     Radiology Studies: No results found.   Scheduled Meds:  apixaban  5 mg Oral BID   ARIPiprazole  5 mg Oral Daily   atorvastatin  40 mg Oral QHS   budesonide (PULMICORT) nebulizer solution  0.25 mg Nebulization BID   Chlorhexidine Gluconate Cloth  6 each Topical Daily   clonazePAM  0.25 mg Oral BID   dapagliflozin propanediol  10 mg Oral Daily   doxepin  100 mg Oral QHS   FLUoxetine  40 mg Oral Daily   insulin aspart  0-9 Units Subcutaneous TID WC   insulin detemir  20 Units Subcutaneous Daily   ipratropium-albuterol  3 mL Nebulization BID   midodrine  5 mg Oral TID WC   nicotine  7 mg Transdermal Daily   nystatin  5 mL Oral QID   mouth rinse  15 mL Mouth Rinse 4 times per day   pantoprazole  40 mg Oral Daily   [START ON 08/03/2022] predniSONE  40 mg Oral Q breakfast   sodium chloride flush  3 mL Intravenous Q12H   sodium chloride flush  3 mL Intravenous Q12H   spironolactone  12.5 mg Oral Daily   torsemide  20 mg Oral Daily   Continuous Infusions:  sodium chloride       LOS: 10 days    Time spent: 54mn  PDomenic Polite MD [facility]   08/02/2022, 11:08 AM

## 2022-08-02 NOTE — Progress Notes (Signed)
   08/02/22 1000  Mobility  Activity Ambulated with assistance in hallway  Level of Assistance Standby assist, set-up cues, supervision of patient - no hands on  Assistive Device Four wheel walker  Distance Ambulated (ft) 120 ft  Activity Response Tolerated well  Mobility Referral Yes  $Mobility charge 1 Mobility   Mobility Specialist Progress Note  Pt was in bed and agreeable. X1 standing break d/t SOB but recovered after being coached through pursed lip breathing. Returned to bed w/ all needs met and call bell in reach.   Lucious Groves Mobility Specialist

## 2022-08-02 NOTE — Inpatient Diabetes Management (Signed)
Inpatient Diabetes Program Recommendations  AACE/ADA: New Consensus Statement on Inpatient Glycemic Control (2015)  Target Ranges:  Prepandial:   less than 140 mg/dL      Peak postprandial:   less than 180 mg/dL (1-2 hours)      Critically ill patients:  140 - 180 mg/dL   Lab Results  Component Value Date   GLUCAP 121 (H) 08/02/2022   HGBA1C 6.2 (H) 07/23/2022    Review of Glycemic Control  Latest Reference Range & Units 08/01/22 06:04 08/01/22 11:16 08/01/22 16:24 08/01/22 20:49 08/02/22 06:07  Glucose-Capillary 70 - 99 mg/dL 164 (H) 183 (H) 322 (H) 202 (H) 121 (H)   Diabetes history: DM 2 Outpatient Diabetes medications: metformin in the past Current orders for Inpatient glycemic control:  Levemir 20 units Daily Farxiga 10 mg Daily Novolog 0-9 units tid  Solumedrol 125 mg Daily  Inpatient Diabetes Program Recommendations:    If steroid dose remains the same consider: -   Adding Novolog 3 units tid meal coverage if eating >50% of meals.  Thanks,  Tama Headings RN, MSN, BC-ADM Inpatient Diabetes Coordinator Team Pager [phone] (8a-5p)

## 2022-08-02 NOTE — Discharge Instructions (Signed)

## 2022-08-02 NOTE — Progress Notes (Signed)
Pt stated she doesn't want to wear bipap tonight. RT explained to pt if she get SOB throughout night to let us know and we will put her on the bipap. HR 78, O2 96%. Pt resting comfortably at this time.

## 2022-08-03 DIAGNOSIS — I5033 Acute on chronic diastolic (congestive) heart failure: Secondary | ICD-10-CM | POA: Diagnosis not present

## 2022-08-03 LAB — BLOOD GAS, ARTERIAL
Acid-Base Excess: 9 mmol/L — ABNORMAL HIGH (ref 0.0–2.0)
Bicarbonate: 33.5 mmol/L — ABNORMAL HIGH (ref 20.0–28.0)
Drawn by: 336832
O2 Saturation: 96.1 %
Patient temperature: 37
pCO2 arterial: 44 mmHg (ref 32–48)
pH, Arterial: 7.49 — ABNORMAL HIGH (ref 7.35–7.45)
pO2, Arterial: 74 mmHg — ABNORMAL LOW (ref 83–108)

## 2022-08-03 LAB — BASIC METABOLIC PANEL
Anion gap: 13 (ref 5–15)
BUN: 46 mg/dL — ABNORMAL HIGH (ref 8–23)
CO2: 31 mmol/L (ref 22–32)
Calcium: 9.8 mg/dL (ref 8.9–10.3)
Chloride: 92 mmol/L — ABNORMAL LOW (ref 98–111)
Creatinine, Ser: 1.1 mg/dL — ABNORMAL HIGH (ref 0.44–1.00)
GFR, Estimated: 56 mL/min — ABNORMAL LOW (ref >60)
Glucose, Bld: 164 mg/dL — ABNORMAL HIGH (ref 70–99)
Potassium: 3.7 mmol/L (ref 3.5–5.1)
Sodium: 136 mmol/L (ref 135–145)

## 2022-08-03 LAB — GLUCOSE, CAPILLARY
Glucose-Capillary: 135 mg/dL — ABNORMAL HIGH (ref 70–99)
Glucose-Capillary: 255 mg/dL — ABNORMAL HIGH (ref 70–99)
Glucose-Capillary: 230 mg/dL — ABNORMAL HIGH (ref 70–99)
Glucose-Capillary: 202 mg/dL — ABNORMAL HIGH (ref 70–99)

## 2022-08-03 MED ORDER — ORAL CARE MOUTH RINSE: 15.0000 mL | OROMUCOSAL | Status: DC | PRN

## 2022-08-03 MED ORDER — MIDODRINE HCL 5 MG PO TABS
2.5000 mg | ORAL_TABLET | Freq: Three times a day (TID) | ORAL | Status: DC
  Administered 2022-08-03 – 2022-08-04 (×3): 2.5 mg via ORAL
  Filled 2022-08-03 (×3): qty 1

## 2022-08-03 MED ORDER — POTASSIUM CHLORIDE CRYS ER 20 MEQ PO TBCR
40.0000 meq | EXTENDED_RELEASE_TABLET | Freq: Once | ORAL | Status: AC
  Administered 2022-08-03: 40 meq via ORAL
  Filled 2022-08-03: qty 2

## 2022-08-03 NOTE — TOC CM/SW Note (Signed)
HF TOC CM contacted [employer] rep, Kim to follow up on BIPAP or NIV. States she will review the new ABG and give CM a call back on getting necessary paperwork for attending to review and sign. [place], Heart Failure TOC CM [phone]

## 2022-08-03 NOTE — Progress Notes (Signed)
Patient ID: [name], female   DOB: 08/06/1961, 61 y.o.   MRN: [id number] Patient ID: [name], female   DOB: 10/18/1960, 61 y.o.   MRN: [id number]     Advanced Heart Failure Rounding Note  PCP-Cardiologist: None   Subjective:     Remains on steroids. Still SOB. On 7L [place]. No other complaints. Euvolemic on exam.    Echo EF 60-65% RV reduced.    RHC Procedural Findings: Hemodynamics (mmHg) RA mean 11 RV 45/15 PA 44/19, mean 28 PCWP mean 4 Oxygen saturations: PA 52% AO 91% Cardiac Output (Fick) 4.69  Cardiac Index (Fick) 2.28 PVR 5.1 WU Cardiac Output (thermo) 5.46 Cardiac Index (thermo) 2.65  PVR 4.4 WU PAPI 2.3   Objective:   Weight Range: 86.8 kg Body mass index is 30.88 kg/m.   Vital Signs:   Temp:  [97.7 F (36.5 C)-98.2 F (36.8 C)] 97.7 F (36.5 C) (11/14 0420) Pulse Rate:  [74-90] 75 (11/14 0420) Resp:  [18-20] 20 (11/14 0420) BP: (99-120)/(55-72) 120/59 (11/14 0420) SpO2:  [93 %-96 %] 93 % (11/14 0420) Weight:  [86.8 kg] 86.8 kg (11/14 0456) Last BM Date : 08/01/22  Weight change: Filed Weights   08/02/22 0447 08/03/22 0012 08/03/22 0456  Weight: 88 kg 86.8 kg 86.8 kg    Intake/Output:   Intake/Output Summary (Last 24 hours) at 08/03/2022 0735 Last data filed at 08/03/2022 0015 Gross per 24 hour  Intake 820 ml  Output 1650 ml  Net -830 ml      Physical Exam   General: NAD Neck: No JVD, no thyromegaly or thyroid nodule.  Lungs: Distant BS CV: Nondisplaced PMI.  Heart regular S1/S2, no S3/S4, no murmur.  No peripheral edema.   Abdomen: Soft, nontender, no hepatosplenomegaly, no distention.  Skin: Intact without lesions or rashes.  Neurologic: Alert and oriented x 3.  Psych: Normal affect. Extremities: No clubbing or cyanosis.  HEENT: Normal.    Telemetry   NSR 90s   EKG    N/A  Labs    CBC No results for input(s): "WBC", "NEUTROABS", "HGB", "HCT", "MCV", "PLT" in the last 72 hours.  Basic Metabolic  Panel Recent Labs    08/02/22 0045 08/03/22 0039  NA 133* 136  K 4.0 3.7  CL 92* 92*  CO2 29 31  GLUCOSE 194* 164*  BUN 47* 46*  CREATININE 1.08* 1.10*  CALCIUM 9.2 9.8   Liver Function Tests No results for input(s): "AST", "ALT", "ALKPHOS", "BILITOT", "PROT", "ALBUMIN" in the last 72 hours. No results for input(s): "LIPASE", "AMYLASE" in the last 72 hours. Cardiac Enzymes No results for input(s): "CKTOTAL", "CKMB", "CKMBINDEX", "TROPONINI" in the last 72 hours.  BNP: BNP (last 3 results) Recent Labs    07/23/22 0952 07/28/22 0121  BNP 905.0* 625.8*    ProBNP (last 3 results) No results for input(s): "PROBNP" in the last 8760 hours.   D-Dimer No results for input(s): "DDIMER" in the last 72 hours. Hemoglobin A1C No results for input(s): "HGBA1C" in the last 72 hours. Fasting Lipid Panel No results for input(s): "CHOL", "HDL", "LDLCALC", "TRIG", "CHOLHDL", "LDLDIRECT" in the last 72 hours. Thyroid Function Tests No results for input(s): "TSH", "T4TOTAL", "T3FREE", "THYROIDAB" in the last 72 hours.  Invalid input(s): "FREET3"  Other results:   Imaging    No results found.   Medications:     Scheduled Medications:  apixaban  5 mg Oral BID   ARIPiprazole  5 mg Oral Daily   atorvastatin  40 mg   Oral QHS   budesonide (PULMICORT) nebulizer solution  0.25 mg Nebulization BID   Chlorhexidine Gluconate Cloth  6 each Topical Daily   clonazePAM  0.25 mg Oral BID   dapagliflozin propanediol  10 mg Oral Daily   doxepin  100 mg Oral QHS   FLUoxetine  40 mg Oral Daily   insulin aspart  0-9 Units Subcutaneous TID WC   insulin detemir  20 Units Subcutaneous Daily   ipratropium-albuterol  3 mL Nebulization BID   midodrine  2.5 mg Oral TID WC   nicotine  7 mg Transdermal Daily   nystatin  5 mL Oral QID   mouth rinse  15 mL Mouth Rinse 4 times per day   pantoprazole  40 mg Oral Daily   predniSONE  40 mg Oral Q breakfast   sodium chloride flush  3 mL Intravenous  Q12H   spironolactone  12.5 mg Oral Daily   torsemide  20 mg Oral Daily    Infusions:    PRN Medications: acetaminophen, dicyclomine, guaiFENesin-dextromethorphan, ipratropium-albuterol, ondansetron (ZOFRAN) IV, sodium chloride   Assessment/Plan    1. Acute hypoxemic respiratory failure: Now on 12 L oxygen.  Has COPD, but does not wear oxygen at home. She was smoking up to this admission.  Suspect combination of AECOPD (emphysema, smoking-related ILD) and RV failure/cor pulmonale.  No evidence for PE by V/Q scan. RHC with low PCWP and elevated right heart pressures, suggesting cor pulmonale.  Given that she still has significant oxygen requirement, it appears that her lung parenchymal disease is quite significant. Elevated ESR and CRP, treating for component of inflammatory lung disease with steroids.  Oxygen requirement down to 4L [place].  - Started on empiric steroids by PCCM 11/09. Now on prednisone taper.  2. RV failure/cor pulmonale: Echo showed EF 60-65%, D-shaped septum, moderate RV enlargement with severely decreased RV systolic function, IVC dilated. RHC with low PCWP, elevated right-sided filling pressures, mild PAH, preserved cardiac output. Suspect RV failure due to parenchymal lung disease (emphysema, smoking-related ILD noted on HRCT 4/23).  She appears well-diuresed at this point with weight significantly down. Creatinine stable. She does not appear volume overloaded on exam.  - Continue torsemide 20 mg daily.   - Continue spironolactone 12.5 daily.  - Continue Farxiga 10 mg daily.   - BP stable, decrease midodrine to 2.5 mg tid.  3. 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  HRCT 4/23 showed emphysema and smoking-related ILD.  V/Q scan did not show evidence for PE.  - Does not appear to need further diuresis - Continue supplemental O2. Notes she did not require supplemental O2 prior to admission 4. COPD: Emphysema on chest imaging.  Active  smoker at admission.  - Needs to quit smoking.  5. Atrial fibrillation: Paroxysmal. She is in NSR.  - Continue  Eliquis.  Co-Pay $45.00. She will need 30 day free card.  6. AKI: Creatinine 2.6 at admission, down to 1.1 today. Follow closely, suspect cardiorenal syndrome.  7. Cirrhosis: Possible cirrhosis by RUQ US.  This may be due to RV failure.  8. 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  9. Chest pain: Resolved.  No known CAD but history of smoking. HS-TnI mildly elevated with no trend at admission but thought to be demand ischemia from hypoxemia and volume overload.  Echo with EF 60-65% RV severely reduced.  No PE on VQ  scan 11/6  She should be ready for home soon, will need home   oxygen and followup in CHF clinic.   Length of Stay: 11   , MD  08/03/2022, 7:35 AM  Advanced Heart Failure Team Pager [phone] (M-F; 7a - 5p)  Please contact [facility] for night-coverage after hours (5p -7a ) and weekends on [email or web address] 

## 2022-08-03 NOTE — Progress Notes (Signed)
PROGRESS NOTE    TENIYAH Braun  YYT:[id number] DOB: 1961-03-07 DOA: 07/23/2022 PCP: Ginger Organ., MD  61 y/o female active smoker w/ h/o COPD, HTN, HLD, Type 2DM and chronic pain syndrome/ fibromyalgia, who presented to ED on 11/3 w/ complains of 3 wk h/o progressive SOB, cough and wheezing,   In ED, found to be in respiratory distress and tachypneic, BNP 905. Creat 2.6 CXR w/ mild diffuse interstitial densities bilaterally c/w pulmonary edema vs possible atypical inflammation.  2D Echo showed severe RV failure.  LVEF 60-65%, GIDD, severely reduced RV systolic function -Diuresed with IV Lasix, advanced heart failure team following, still remains profoundly hypoxic -HRCT 11/7: New mid and upper lung groundglass opacities favored to represent CHF, mediastinal adenopathy noted as well -11/8: Pulmonary consulted -11/9 started pulse IV steroids  Subjective: -Feels fair, breathing continues to improve, ambulating more  Assessment and Plan:  Acute Hypoxic resp failure -multifactorial, CHF, with underlying COPD, Suspected acute inflammatory pneumonitis,?  NSIP -VQ scan negative for PE -diuresed with IV lasix, -12 L, oxygen requirement remains high-unchanged -HRCT 4/23, concerning for emphysema and interstitial fibrosis, still smoking -Repeat CT chest 11/7 with new upper and middle lobe groundglass opacities favored to represent CHF, but wedge pressure is down adequately diuresed, neg 17 L, po torsemide  -Resp viral panel, flu and Covid neg -appreciate pulmonary input, ESR and CRP is high,  -Started high-dose pulse steroids 11/9 for possible ILD flare-slowly improving, now down to 4 L O2 this morning, transitioned to oral prednisone yesterday, see Dr. Anastasia Pall note 11/13 for taper recommendations -Continue pulmonary toilet, increase activity, TOC working on home BiPAP and O2 -Anticipate discharge home in 1 to 2 days, pulmonary to arrange follow-up with Dr. Chase Caller    Acute on  chronic diastolic CHF RV failure, Cor Pulmonale Pulm HTN -Echo preserved LV systolic function EF 60 to 27%,, RV systolic function with severe reduction, -RHC 11/6 showed R>>L heart failure, c low wedge and elevated right heart pressures suggesting cor pulmonale -diuresed w/ IV lasix, 17 L neg, now on oral torsemide, Aldactone, Jardiance -Heart failure team following -remains on midodrine   Acute exacerbation of COPD with asthma ([place]) -on bronchodilator therapy   P.Afib -in NSR, continue eliquis   AKI  Hypokalemia  -creat 2.6 on admission, cardiorenal, improving now 1.2   Cirrhosis -suspected on RUQ US-11/3 -Likely secondary to RV failure   HIV positive screen-lab error -HIV viral RNA not detected, confirmatory test negative   Essential hypertension Continue blood pressure support with midodrine.    Acid reflux disease Continue acid suppressive therapy.    Type 2 diabetes mellitus with hyperlipidemia (HCC) -CBGs stable, continue Levemir   Fibromyalgia Depression,  Continue with aripiprazole, clonazepam    Class 2 obesity Calculated BMI is 36.3      DVT prophylaxis:apixaban Code Status: FUll Code Family Communication: None present Disposition: Home 1 to 2 days   Consultants: Cards and pulmonary  Objective: Vitals:   08/03/22 0456 08/03/22 0721 08/03/22 0916 08/03/22 0919  BP:  114/69    Pulse:  77    Resp:  20    Temp:  97.8 F (36.6 C)    TempSrc:  Oral    SpO2:  93% 90% 97%  Weight: 86.8 kg     Height:        Intake/Output Summary (Last 24 hours) at 08/03/2022 1113 Last data filed at 08/03/2022 0015 Gross per 24 hour  Intake 580 ml  Output 1300 ml  Net -  720 ml   Filed Weights   08/02/22 0447 08/03/22 0012 08/03/22 0456  Weight: 88 kg 86.8 kg 86.8 kg    Examination:  General exam: Obese chronically ill female sitting up in the recliner, AAOx3, no distress HEENT: No JVD CVS: S1-S2, regular rhythm Lungs: Few scattered rales,  improving Abdomen: Soft, nontender, bowel sounds present Extremities: No edema Skin: No rashes Psychiatry:  Mood & affect appropriate.     Data Reviewed:   CBC: Recent Labs  Lab 07/28/22 0121 07/29/22 0055 07/30/22 0038  WBC 8.8 9.0 7.3  HGB 11.6* 11.6* 12.0  HCT 35.7* 37.4 39.2  MCV 87.3 88.0 89.5  PLT 247 280 967   Basic Metabolic Panel: Recent Labs  Lab 07/30/22 0038 07/31/22 0029 08/01/22 0020 08/02/22 0045 08/03/22 0039  NA 136 135 134* 133* 136  K 4.9 4.1 4.7 4.0 3.7  CL 96* 95* 94* 92* 92*  CO2 _0 GLUCOSE 214* 233* 258* 194* 164*  BUN 29* 40* 45* 47* 46*  CREATININE 1.11* 1.16* 1.28* 1.08* 1.10*  CALCIUM 9.3 9.4 9.8 9.2 9.8   GFR: Estimated Creatinine Clearance: 58.1 mL/min (A) (by C-G formula based on SCr of 1.1 mg/dL (H)). Liver Function Tests: No results for input(s): "AST", "ALT", "ALKPHOS", "BILITOT", "PROT", "ALBUMIN" in the last 168 hours.  No results for input(s): "LIPASE", "AMYLASE" in the last 168 hours. No results for input(s): "AMMONIA" in the last 168 hours.  Coagulation Profile: No results for input(s): "INR", "PROTIME" in the last 168 hours.  Cardiac Enzymes: No results for input(s): "CKTOTAL", "CKMB", "CKMBINDEX", "TROPONINI" in the last 168 hours. BNP (last 3 results) No results for input(s): "PROBNP" in the last 8760 hours. HbA1C: No results for input(s): "HGBA1C" in the last 72 hours. CBG: Recent Labs  Lab 08/02/22 1115 08/02/22 1533 08/02/22 2100 08/03/22 0613 08/03/22 1107  GLUCAP 265* 321* 197* 135* 255*   Lipid Profile: No results for input(s): "CHOL", "HDL", "LDLCALC", "TRIG", "CHOLHDL", "LDLDIRECT" in the last 72 hours. Thyroid Function Tests: No results for input(s): "TSH", "T4TOTAL", "FREET4", "T3FREE", "THYROIDAB" in the last 72 hours. Anemia Panel: No results for input(s): "VITAMINB12", "FOLATE", "FERRITIN", "TIBC", "IRON", "RETICCTPCT" in the last 72 hours. Urine analysis:    Component Value  Date/Time   COLORURINE YELLOW 11/22/2019 1351   APPEARANCEUR CLEAR 11/22/2019 1351   LABSPEC 1.008 11/22/2019 1351   PHURINE 5.0 11/22/2019 1351   GLUCOSEU NEGATIVE 11/22/2019 1351   HGBUR SMALL (A) 11/22/2019 1351   BILIRUBINUR NEGATIVE 11/22/2019 1351   KETONESUR NEGATIVE 11/22/2019 1351   PROTEINUR NEGATIVE 11/22/2019 1351   UROBILINOGEN 0.2 11/02/2012 1530   NITRITE NEGATIVE 11/22/2019 1351   LEUKOCYTESUR NEGATIVE 11/22/2019 1351   Sepsis Labs: _1 (procalcitonin:4,lacticidven:4)  ) Recent Results (from the past 240 hour(s))  Respiratory (~20 pathogens) panel by PCR     Status: None   Collection Time: 07/25/22  6:15 PM   Specimen: Nasopharyngeal Swab; Respiratory  Result Value Ref Range Status   Adenovirus NOT DETECTED NOT DETECTED Final   Coronavirus 229E NOT DETECTED NOT DETECTED Final    Comment: (NOTE) The Coronavirus on the Respiratory Panel, DOES NOT test for the novel  Coronavirus (2019 nCoV)    Coronavirus HKU1 NOT DETECTED NOT DETECTED Final   Coronavirus NL63 NOT DETECTED NOT DETECTED Final   Coronavirus OC43 NOT DETECTED NOT DETECTED Final   Metapneumovirus NOT DETECTED NOT DETECTED Final   Rhinovirus / Enterovirus NOT DETECTED NOT DETECTED Final   Influenza A NOT DETECTED NOT  DETECTED Final   Influenza B NOT DETECTED NOT DETECTED Final   Parainfluenza Virus 1 NOT DETECTED NOT DETECTED Final   Parainfluenza Virus 2 NOT DETECTED NOT DETECTED Final   Parainfluenza Virus 3 NOT DETECTED NOT DETECTED Final   Parainfluenza Virus 4 NOT DETECTED NOT DETECTED Final   Respiratory Syncytial Virus NOT DETECTED NOT DETECTED Final   Bordetella pertussis NOT DETECTED NOT DETECTED Final   Bordetella Parapertussis NOT DETECTED NOT DETECTED Final   Chlamydophila pneumoniae NOT DETECTED NOT DETECTED Final   Mycoplasma pneumoniae NOT DETECTED NOT DETECTED Final    Comment: Performed at [facility], [place] [address], [place], [place] [address]     Radiology  Studies: No results found.   Scheduled Meds:  apixaban  5 mg Oral BID   ARIPiprazole  5 mg Oral Daily   atorvastatin  40 mg Oral QHS   budesonide (PULMICORT) nebulizer solution  0.25 mg Nebulization BID   Chlorhexidine Gluconate Cloth  6 each Topical Daily   clonazePAM  0.25 mg Oral BID   dapagliflozin propanediol  10 mg Oral Daily   doxepin  100 mg Oral QHS   FLUoxetine  40 mg Oral Daily   insulin aspart  0-9 Units Subcutaneous TID WC   insulin detemir  20 Units Subcutaneous Daily   ipratropium-albuterol  3 mL Nebulization BID   midodrine  2.5 mg Oral TID WC   nicotine  7 mg Transdermal Daily   nystatin  5 mL Oral QID   pantoprazole  40 mg Oral Daily   predniSONE  40 mg Oral Q breakfast   sodium chloride flush  3 mL Intravenous Q12H   spironolactone  12.5 mg Oral Daily   torsemide  20 mg Oral Daily   Continuous Infusions:     LOS: 11 days    Time spent: 52mn  PDomenic Polite MD [facility]   08/03/2022, 11:13 AM

## 2022-08-03 NOTE — TOC CM/SW Note (Signed)
HF TOC CM received message from [place] and states she is working on NIV/BIPAP referral. [facility], Heart Failure TOC CM [phone]

## 2022-08-03 NOTE — Inpatient Diabetes Management (Signed)
Inpatient Diabetes Program Recommendations  AACE/ADA: New Consensus Statement on Inpatient Glycemic Control (2015)  Target Ranges:  Prepandial:   less than 140 mg/dL      Peak postprandial:   less than 180 mg/dL (1-2 hours)      Critically ill patients:  140 - 180 mg/dL   Lab Results  Component Value Date   GLUCAP 135 (H) 08/03/2022   HGBA1C 6.2 (H) 07/23/2022    Review of Glycemic Control  Latest Reference Range & Units 08/02/22 06:07 08/02/22 11:15 08/02/22 15:33 08/02/22 21:00 08/03/22 06:13  Glucose-Capillary 70 - 99 mg/dL 121 (H) 265 (H) 321 (H) 197 (H) 135 (H)   Diabetes history: DM 2 Outpatient Diabetes medications: metformin in the past Current orders for Inpatient glycemic control:  Levemir 20 units Daily Farxiga 10 mg Daily Novolog 0-9 units tid  PO prednisone 40 mg Daily starting 11/14  Inpatient Diabetes Program Recommendations:    If steroid dose transitioned to PO for today. Anticipate glucose trends increasing after meal intake and steroid dose, consider:  -   Adding Novolog 3 units tid meal coverage if eating >50% of meals.  Thanks,  Tama Headings RN, MSN, BC-ADM Inpatient Diabetes Coordinator Team Pager [phone] (8a-5p)

## 2022-08-03 NOTE — Progress Notes (Signed)
   08/03/22 1032  Mobility  Activity Ambulated with assistance in hallway  Level of Assistance Standby assist, set-up cues, supervision of patient - no hands on  Distance Ambulated (ft) 125 ft  Activity Response Tolerated well  Mobility Referral Yes  $Mobility charge 1 Mobility   Mobility Specialist Progress Note  Pre-Mobility: 96%SpO2 During Mobility: 89% SpO2 Post-Mobility: 88%SpO2  Pt was in bed and agreeable. Had no c/o pain throughout ambulation. Returned to sink chair w/ NT with all needs met.   Lucious Groves Mobility Specialist

## 2022-08-04 ENCOUNTER — Inpatient Hospital Stay (HOSPITAL_COMMUNITY): Payer: HMO

## 2022-08-04 ENCOUNTER — Other Ambulatory Visit (HOSPITAL_COMMUNITY): Payer: Self-pay

## 2022-08-04 ENCOUNTER — Encounter (HOSPITAL_COMMUNITY): Payer: HMO

## 2022-08-04 LAB — SPIROMETRY WITH GRAPH
FEF 25-75 Pre: 1.51 L/sec
FEF2575-%Pred-Pre: 68 %
FEV1-%Pred-Pre: 60 %
FEV1-Pre: 1.49 L
FEV1FVC-%Pred-Pre: 107 %
FEV6-%Pred-Pre: 57 %
FEV6-Pre: 1.79 L
FEV6FVC-%Pred-Pre: 103 %
FVC-%Pred-Pre: 55 %
FVC-Pre: 1.79 L
Pre FEV1/FVC ratio: 83 %
Pre FEV6/FVC Ratio: 100 %

## 2022-08-04 LAB — BASIC METABOLIC PANEL
Anion gap: 11 (ref 5–15)
BUN: 47 mg/dL — ABNORMAL HIGH (ref 8–23)
CO2: 29 mmol/L (ref 22–32)
Calcium: 9.6 mg/dL (ref 8.9–10.3)
Chloride: 96 mmol/L — ABNORMAL LOW (ref 98–111)
Creatinine, Ser: 1.08 mg/dL — ABNORMAL HIGH (ref 0.44–1.00)
GFR, Estimated: 58 mL/min — ABNORMAL LOW (ref >60)
Glucose, Bld: 147 mg/dL — ABNORMAL HIGH (ref 70–99)
Potassium: 4 mmol/L (ref 3.5–5.1)
Sodium: 136 mmol/L (ref 135–145)

## 2022-08-04 LAB — GLUCOSE, CAPILLARY
Glucose-Capillary: 125 mg/dL — ABNORMAL HIGH (ref 70–99)
Glucose-Capillary: 237 mg/dL — ABNORMAL HIGH (ref 70–99)

## 2022-08-04 MED ORDER — IPRATROPIUM-ALBUTEROL 0.5-2.5 (3) MG/3ML IN SOLN
3.0000 mL | Freq: Four times a day (QID) | RESPIRATORY_TRACT | 0 refills | Status: DC | PRN
  Filled 2022-08-04: qty 90, 8d supply, fill #0

## 2022-08-04 MED ORDER — DAPAGLIFLOZIN PROPANEDIOL 10 MG PO TABS
10.0000 mg | ORAL_TABLET | Freq: Every day | ORAL | 0 refills | Status: DC
  Filled 2022-08-04: qty 30, 30d supply, fill #0

## 2022-08-04 MED ORDER — PREDNISONE 20 MG PO TABS
10.0000 mg | ORAL_TABLET | Freq: Every day | ORAL | 1 refills | Status: DC
  Filled 2022-08-04: qty 29, 25d supply, fill #0

## 2022-08-04 MED ORDER — SPIRONOLACTONE 25 MG PO TABS
12.5000 mg | ORAL_TABLET | Freq: Every day | ORAL | 0 refills | Status: AC
  Filled 2022-08-04: qty 30, 60d supply, fill #0

## 2022-08-04 MED ORDER — APIXABAN 5 MG PO TABS
5.0000 mg | ORAL_TABLET | Freq: Two times a day (BID) | ORAL | 0 refills | Status: AC
  Filled 2022-08-04: qty 60, 30d supply, fill #0

## 2022-08-04 MED ORDER — BUDESONIDE 0.25 MG/2ML IN SUSP
0.2500 mg | Freq: Two times a day (BID) | RESPIRATORY_TRACT | 0 refills | Status: DC
  Filled 2022-08-04: qty 120, 30d supply, fill #0

## 2022-08-04 MED ORDER — CLONAZEPAM 0.25 MG PO TBDP
0.2500 mg | ORAL_TABLET | Freq: Two times a day (BID) | ORAL | 0 refills | Status: DC | PRN
  Filled 2022-08-04: qty 60, 30d supply, fill #0

## 2022-08-04 MED ORDER — TORSEMIDE 20 MG PO TABS
20.0000 mg | ORAL_TABLET | Freq: Every day | ORAL | 0 refills | Status: DC
  Filled 2022-08-04: qty 30, 30d supply, fill #0

## 2022-08-04 NOTE — Progress Notes (Signed)
   08/04/22 1439  Mobility  Activity Ambulated with assistance in hallway  Level of Assistance Standby assist, set-up cues, supervision of patient - no hands on  Assistive Device Four wheel walker  Distance Ambulated (ft) 140 ft  Activity Response Tolerated well  Mobility Referral Yes  $Mobility charge 1 Mobility   Mobility Specialist Progress Note  Pt was in bed and agreeable. Had no c/o pain throughout ambulation. Returned to bed w/ all needs met and call bell in reach.     Mobility Specialist   

## 2022-08-04 NOTE — Progress Notes (Signed)
Nurse requested Mobility Specialist to perform oxygen saturation test with pt which includes removing pt from oxygen both at rest and while ambulating.  Below are the results from that testing.     Patient Saturations on Room Air at Rest = spO2 86%  Patient Saturations on Room Air while Ambulating = sp02 N/A% .  Rested and performed pursed lip breathing for 1 minute with sp02 at N/A%.  Patient Saturations on 2 Liters of oxygen while Ambulating = sp02 91%  At end of testing pt left in room on 2  Liters of oxygen.  Reported results to nurse.

## 2022-08-04 NOTE — Progress Notes (Addendum)
Patient ID: [name], female   DOB: Dec 16, 1960, 62 y.o.   MRN: [id number]     Advanced Heart Failure Rounding Note  PCP-Cardiologist: None   Subjective:    Remains on steroids. Less O2 requirements today, down to 4L [place] (down from 7). Wt stable.   No complaints    Echo EF 60-65% RV reduced.    RHC Procedural Findings: Hemodynamics (mmHg) RA mean 11 RV 45/15 PA 44/19, mean 28 PCWP mean 4 Oxygen saturations: PA 52% AO 91% Cardiac Output (Fick) 4.69  Cardiac Index (Fick) 2.28 PVR 5.1 WU Cardiac Output (thermo) 5.46 Cardiac Index (thermo) 2.65  PVR 4.4 WU PAPI 2.3   Objective:   Weight Range: 86.2 kg Body mass index is 30.67 kg/m.   Vital Signs:   Temp:  [97.8 F (36.6 C)-98.6 F (37 C)] 98.3 F (36.8 C) (11/15 0723) Pulse Rate:  [78-87] 78 (11/15 0723) Resp:  [18-20] 18 (11/15 0723) BP: (106-130)/(53-88) 115/63 (11/15 0723) SpO2:  [90 %-99 %] 94 % (11/15 0754) Weight:  [86.2 kg] 86.2 kg (11/15 0137) Last BM Date : 08/01/22  Weight change: Filed Weights   08/03/22 0012 08/03/22 0456 08/04/22 0137  Weight: 86.8 kg 86.8 kg 86.2 kg    Intake/Output:   Intake/Output Summary (Last 24 hours) at 08/04/2022 0814 Last data filed at 08/04/2022 0506 Gross per 24 hour  Intake 440 ml  Output 880 ml  Net -440 ml      Physical Exam   General:  Well appearing. No respiratory difficulty HEENT: normal Neck: supple. no JVD. Carotids 2+ bilat; no bruits. No lymphadenopathy or thyromegaly appreciated. Cor: PMI nondisplaced. Regular rate & rhythm. No rubs, gallops or murmurs. Lungs: decreased BS at the bases bilaterally  Abdomen: soft, nontender, nondistended. No hepatosplenomegaly. No bruits or masses. Good bowel sounds. Extremities: no cyanosis, clubbing, rash, edema Neuro: alert & oriented x 3, cranial nerves grossly intact. moves all 4 extremities w/o difficulty. Affect pleasant.   Telemetry   NSR-ST 90s-low 100s   EKG    N/A  Labs     CBC No results for input(s): "WBC", "NEUTROABS", "HGB", "HCT", "MCV", "PLT" in the last 72 hours.  Basic Metabolic Panel Recent Labs    08/03/22 0039 08/04/22 0049  NA 136 136  K 3.7 4.0  CL 92* 96*  CO2 31 29  GLUCOSE 164* 147*  BUN 46* 47*  CREATININE 1.10* 1.08*  CALCIUM 9.8 9.6   Liver Function Tests No results for input(s): "AST", "ALT", "ALKPHOS", "BILITOT", "PROT", "ALBUMIN" in the last 72 hours. No results for input(s): "LIPASE", "AMYLASE" in the last 72 hours. Cardiac Enzymes No results for input(s): "CKTOTAL", "CKMB", "CKMBINDEX", "TROPONINI" in the last 72 hours.  BNP: BNP (last 3 results) Recent Labs    07/23/22 0952 07/28/22 0121  BNP 905.0* 625.8*    ProBNP (last 3 results) No results for input(s): "PROBNP" in the last 8760 hours.   D-Dimer No results for input(s): "DDIMER" in the last 72 hours. Hemoglobin A1C No results for input(s): "HGBA1C" in the last 72 hours. Fasting Lipid Panel No results for input(s): "CHOL", "HDL", "LDLCALC", "TRIG", "CHOLHDL", "LDLDIRECT" in the last 72 hours. Thyroid Function Tests No results for input(s): "TSH", "T4TOTAL", "T3FREE", "THYROIDAB" in the last 72 hours.  Invalid input(s): "FREET3"  Other results:   Imaging    No results found.   Medications:     Scheduled Medications:  apixaban  5 mg Oral BID   ARIPiprazole  5 mg Oral Daily  atorvastatin  40 mg Oral QHS   budesonide (PULMICORT) nebulizer solution  0.25 mg Nebulization BID   clonazePAM  0.25 mg Oral BID   dapagliflozin propanediol  10 mg Oral Daily   doxepin  100 mg Oral QHS   FLUoxetine  40 mg Oral Daily   insulin aspart  0-9 Units Subcutaneous TID WC   insulin detemir  20 Units Subcutaneous Daily   ipratropium-albuterol  3 mL Nebulization BID   midodrine  2.5 mg Oral TID WC   nicotine  7 mg Transdermal Daily   nystatin  5 mL Oral QID   pantoprazole  40 mg Oral Daily   predniSONE  40 mg Oral Q breakfast   sodium chloride flush  3 mL  Intravenous Q12H   spironolactone  12.5 mg Oral Daily   torsemide  20 mg Oral Daily    Infusions:    PRN Medications: acetaminophen, dicyclomine, guaiFENesin-dextromethorphan, ipratropium-albuterol, ondansetron (ZOFRAN) IV, mouth rinse, sodium chloride   Assessment/Plan    1. Acute hypoxemic respiratory failure: Now on 12 L oxygen.  Has COPD, but does not wear oxygen at home. She was smoking up to this admission.  Suspect combination of AECOPD (emphysema, smoking-related ILD) and RV failure/cor pulmonale.  No evidence for PE by V/Q scan. RHC with low PCWP and elevated right heart pressures, suggesting cor pulmonale.  Given that she still has significant oxygen requirement, it appears that her lung parenchymal disease is quite significant. Elevated ESR and CRP, treating for component of inflammatory lung disease with steroids.  Oxygen requirement down to 4L [place].  - Started on empiric steroids by [facility] 11/09. Now on prednisone taper.  2. RV failure/cor pulmonale: Echo showed EF 60-65%, D-shaped septum, moderate RV enlargement with severely decreased RV systolic function, IVC dilated. RHC with low PCWP, elevated right-sided filling pressures, mild PAH, preserved cardiac output. Suspect RV failure due to parenchymal lung disease (emphysema, smoking-related ILD noted on HRCT 4/23).  She appears well-diuresed at this point with weight significantly down. Creatinine stable. She does not appear volume overloaded on exam.  - Continue torsemide 20 mg daily.   - Continue spironolactone 12.5 daily.  - Continue Farxiga 10 mg daily.   - BP stable, continue midodrine 2.5 mg tid.  3. 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  HRCT 4/23 showed emphysema and smoking-related ILD.  V/Q scan did not show evidence for PE.  - Does not appear to need further diuresis - Continue supplemental O2. Notes she did not require supplemental O2 prior to admission 4. COPD: Emphysema on chest  imaging.  Active smoker at admission.  - Needs to quit smoking.  5. Atrial fibrillation: Paroxysmal. She is in NSR.  - Continue  Eliquis.  Co-Pay $45.00. She will need 30 day free card.  6. AKI: Creatinine 2.6 at admission, down to 1.08 today. Follow closely, suspect cardiorenal syndrome.  7. Cirrhosis: Possible cirrhosis by RUQ [place].  This may be due to RV failure.  8. 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  9. Chest pain: Resolved.  No known CAD but history of smoking. HS-TnI mildly elevated with no trend at admission but thought to be demand ischemia from hypoxemia and volume overload.  Echo with EF 60-65% RV severely reduced.  No PE on VQ  scan 11/6  Stable for d/c home from HF standpoint. Will need home O2. We have arranged post hospital f/u in [facility]. Appt info in AVS.    Cardiac Meds  for Discharge  Eliquis 5 mg bid Atorvastatin 40 mg daily  Farxiga 10 mg daily  Spironolactone 12.5 mg daily  Torsemide 20 mg daily   Prednisone taper per pulmonology    Length of Stay: [address] PA-C  08/04/2022, 8:14 AM  Advanced Heart Failure Team Pager [phone] (M-F; 7a - 5p)  Please contact [facility] for night-coverage after hours (5p -7a ) and weekends on [email or web address]  Agree with the above PA note.   Oxygen down to 3L [place].  Breathing overall improved.  BP stable.   General: NAD Neck: No JVD, no thyromegaly or thyroid nodule.  Lungs: Distant BS.  CV: Nondisplaced PMI.  Heart regular S1/S2, no S3/S4, no murmur.  No peripheral edema.   Abdomen: Soft, nontender, no hepatosplenomegaly, no distention.  Skin: Intact without lesions or rashes.  Neurologic: Alert and oriented x 3.  Psych: Normal affect. Extremities: No clubbing or cyanosis.  HEENT: Normal.   OK for home from my standpoint.  BP looks good on low dose midodrine, would try her off midodrine (can add back in future if needed).  Continue torsemide 20 mg daily, spironolactone 12.5 daily and  Farxiga 10 daily.  Creatinine stable and she does not look volume overloaded.   Will need followup in CHF clinic and followup with pulmonary for prednisone taper (should have appt with Dr. Chase Caller).   Loralie Champagne 08/04/2022

## 2022-08-04 NOTE — Progress Notes (Signed)
Physical Therapy Treatment Patient Details Name: [name] MRN: [id number] DOB: 20-Aug-1961 Today's Date: 08/04/2022   History of Present Illness Pt is a 61 y.o. F who presents 07/23/2022 with dyspnea and LE edema. Working diagnosis of heart failure/COPD decompensation. Significant PMH: COPD, HTN, dyslipidemia, fibromyalgia, DM2.    PT Comments    Pt politely declining attempts to progress mobility this session as she is planning to d/c home soon. However, pt requesting to focus this session on pt education in regards to heart failure management and Energy Conservation techniques, provided handouts/booklets on both. See General Comments below. If pt remains in the hospital, will plan to continue to follow acutely.     Recommendations for follow up therapy are one component of a multi-disciplinary discharge planning process, led by the attending physician.  Recommendations may be updated based on patient status, additional functional criteria and insurance authorization.  Follow Up Recommendations  No PT follow up (pending progress)     Assistance Recommended at Discharge PRN  Patient can return home with the following A little help with walking and/or transfers;Assistance with cooking/housework;Assist for transportation;Help with stairs or ramp for entrance   Equipment Recommendations  Rollator (4 wheels)    Recommendations for Other Services       Precautions / Restrictions Precautions Precautions: Fall;Other (comment) Precaution Comments: watch O2 Restrictions Weight Bearing Restrictions: No     Mobility  Bed Mobility               General bed mobility comments: Pt politely declined mobility due to plan for d/c home and having ambulated with mobility specialist earlier, instead requesting PT provide education in regards to managing CHF and on Energy Conservation techniques.    Transfers                   General transfer comment: Pt politely  declined mobility due to plan for d/c home and having ambulated with mobility specialist earlier, instead requesting PT provide education in regards to managing CHF and on Energy Conservation techniques.    Ambulation/Gait               General Gait Details: Pt politely declined mobility due to plan for d/c home and having ambulated with mobility specialist earlier, instead requesting PT provide education in regards to managing CHF and on Energy Conservation techniques.   Stairs             Wheelchair Mobility    Modified Rankin (Stroke Patients Only)       Balance                                            Cognition Arousal/Alertness: Awake/alert Behavior During Therapy: WFL for tasks assessed/performed Overall Cognitive Status: Within Functional Limits for tasks assessed                                          Exercises      General Comments General comments (skin integrity, edema, etc.): pt requesting information in regards to her home O2 options, contacted CM; provided pt with heart failure booklet and Energy Conservation handout and educated pt on fluid restrictions, reducing sodium, healthy food options, weighing self daily, and progressing mobility in duration and frequency; she verbalized understanding and  no further questions at this time      Pertinent Vitals/Pain Pain Assessment Pain Assessment: Faces Faces Pain Scale: No hurt Pain Intervention(s): Monitored during session    Home Living                          Prior Function            PT Goals (current goals can now be found in the care plan section) Acute Rehab PT Goals Patient Stated Goal: to return home and be set up for home O2 PT Goal Formulation: With patient Time For Goal Achievement: 08/08/22 Potential to Achieve Goals: Good Progress towards PT goals: Progressing toward goals    Frequency    Min 3X/week      PT Plan Current  plan remains appropriate    Co-evaluation              AM-PAC PT "6 Clicks" Mobility   Outcome Measure  Help needed turning from your back to your side while in a flat bed without using bedrails?: None Help needed moving from lying on your back to sitting on the side of a flat bed without using bedrails?: None Help needed moving to and from a bed to a chair (including a wheelchair)?: A Little Help needed standing up from a chair using your arms (e.g., wheelchair or bedside chair)?: A Little Help needed to walk in hospital room?: A Little Help needed climbing 3-5 steps with a railing? : A Little 6 Click Score: 20    End of Session Equipment Utilized During Treatment: Oxygen Activity Tolerance: Patient tolerated treatment well Patient left: with call bell/phone within reach;in bed   PT Visit Diagnosis: Unsteadiness on feet (R26.81);Difficulty in walking, not elsewhere classified (R26.2)     Time: 5366-4403 PT Time Calculation (min) (ACUTE ONLY): 14 min  Charges:  $Therapeutic Activity: 8-22 mins                     Moishe Spice, PT, DPT Acute Rehabilitation Services  Office: [phone]    Orvan Falconer 08/04/2022, 2:09 PM

## 2022-08-04 NOTE — Discharge Summary (Signed)
Physician Discharge Summary  [name] DQQ:[id number] DOB: [birth year] DOA: 07/23/2022  PCP: Ginger Organ., MD  Admit date: 07/23/2022 Discharge date: 08/04/2022  Time spent: 45 minutes  Recommendations for Outpatient Follow-up:  Pulmonary Dr. Chase Caller in 2 weeks Advanced heart failure team in 2 to 3 weeks Home health RN   Discharge Diagnoses:  Principal Problem:   Acute on chronic diastolic CHF (congestive heart failure) (HCC) Acute hypoxic respiratory failure Possible ILD flare   Acute exacerbation of COPD with asthma ([place])   AKI (acute kidney injury) ([place])   Essential hypertension   Acid reflux disease   Type 2 diabetes mellitus with hyperlipidemia (HCC)   Fibromyalgia   Class 2 obesity   Paroxysmal atrial fibrillation [facility])   Discharge Condition: Improved  Diet recommendation: Low-sodium, diabetic  Filed Weights   08/03/22 0012 08/03/22 0456 08/04/22 0137  Weight: 86.8 kg 86.8 kg 86.2 kg    History of present illness:  61 y/o female active smoker w/ h/o COPD, HTN, HLD, Type 2DM and chronic pain syndrome/ fibromyalgia, who presented to ED on 11/3 w/ complains of 3 wk h/o progressive SOB, cough and wheezing,   In ED, found to be in respiratory distress and tachypneic, BNP 905. Creat 2.6 CXR w/ mild diffuse interstitial densities bilaterally c/w pulmonary edema vs possible atypical inflammation.  2D Echo showed severe RV failure.  LVEF 60-65%, GIDD, severely reduced RV systolic function -Diuresed with IV Lasix, advanced heart failure team following, still remains profoundly hypoxic -HRCT 11/7: New mid and upper lung groundglass opacities favored to represent CHF, mediastinal adenopathy noted as well -11/8: Pulmonary consulted -11/9 started pulse IV steroids  Hospital Course:   Acute Hypoxic resp failure -multifactorial, CHF, with underlying COPD,?  ILD flare -diuresed well with IV Lasix, oxygen requirement remained high-unchanged despite diuretics, VQ  scan was negative for PE -HRCT 4/23, concerning for emphysema and interstitial fibrosis, heavy smoker -Repeat CT chest 11/7 with new upper and middle lobe groundglass opacities favored to represent CHF, but wedge pressure is down and appears adequately diuresed, neg 19 L, po torsemide  -Seen by pulmonary in consultation, ESR and CRP is high,  -Started high-dose pulse steroids 11/9 for possible ILD flare-now slowly improving -Continue pulm toilet, incentive spirometry, increase activity -Pulmonary team transitioned her to prednisone 40 Mg daily with recommendation to taper by 10 mg weekly -Down to 3 L O2 this morning, will attempt to wean down to 2 L, anticipate DC home on 2 L home O2 with close follow-up with Dr. Chase Caller -She will have a follow-up with CHF team as well   Acute on chronic diastolic CHF RV failure, Cor Pulmonale Pulm HTN -Echo preserved LV systolic function EF 60 to 19%,, RV systolic function with severe reduction, -RHC 11/6 showed R>>L heart failure, c low wedge and elevated right heart pressures suggesting cor pulmonale -diuresed w/ IV lasix, 19L neg, now on oral torsemide, Aldactone, Jardiance -Followed by heart failure team, now improved and euvolemic -Midodrine discontinued -Follow-up with Dr. 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    Acid reflux disease Continue acid suppressive therapy.  Type 2 diabetes mellitus with hyperlipidemia (HCC) -CBGs stable, continue Levemir   Fibromyalgia Depression,  Continue with aripiprazole, clonazepam    Class 2  obesity Calculated BMI is 36.3     Discharge Exam: Vitals:   08/04/22 0723 08/04/22 0754  BP: 115/63   Pulse: 78   Resp: 18   Temp: 98.3 F (36.8 C)   SpO2: 94% 94%   Gen: Awake, Alert, Oriented X 3,  HEENT: no JVD Lungs: Good air movement bilaterally, few rhonchi CVS: S1S2/RRR Abd: soft, Non tender, non distended, BS present Extremities: No edema Skin: no new rashes on exposed skin   Discharge Instructions    Allergies as of 08/04/2022       Reactions   Tramadol    Per Dr. Sharyon Medicus is renal failure, patient states that she is currently taking this medication and she has not had a reaction to Tramadol since this time.        Medication List     STOP taking these medications    clonazePAM 1 MG tablet Commonly known as: KLONOPIN Replaced by: clonazePAM 0.25 MG disintegrating tablet   furosemide 20 MG tablet Commonly known as: LASIX   lisinopril 20 MG tablet Commonly known as: ZESTRIL   OVER THE COUNTER MEDICATION   prochlorperazine 10 MG tablet Commonly known as: COMPAZINE   promethazine 25 MG tablet Commonly known as: PHENERGAN       TAKE these medications    acetaminophen 500 MG tablet Commonly known as: TYLENOL Take 500 mg by mouth every 6 (six) hours as needed.   apixaban 5 MG Tabs tablet Commonly known as: ELIQUIS Take 1 tablet (5 mg total) by mouth 2 (two) times daily.   ARIPiprazole 5 MG tablet Commonly known as: ABILIFY Take 1 tablet (5 mg total) by mouth daily.   aspirin EC 81 MG tablet Take one (1) tablet by mouth daily Oral   atorvastatin 40 MG tablet Commonly known as: LIPITOR Take 40 mg by mouth at bedtime.   budesonide 0.25 MG/2ML nebulizer solution Commonly known as: PULMICORT Take 2 mLs (0.25 mg total) by nebulization 2 (two) times daily.   clonazePAM 0.25 MG disintegrating tablet Commonly known as: KLONOPIN Take 1 tablet (0.25 mg total) by mouth 2 (two) times daily as needed (anxiety). Replaces:  clonazePAM 1 MG tablet   dapagliflozin propanediol 10 MG Tabs tablet Commonly known as: FARXIGA Take 1 tablet (10 mg total) by mouth daily. Start taking on: August 05, 2022   dicyclomine 10 MG capsule Commonly known as: BENTYL Take 10 mg by mouth every 8 (eight) hours as needed for spasms (abdominal spasms).   doxepin 100 MG capsule Commonly known as: SINEQUAN Take 1 capsule (100 mg total) by mouth at bedtime.   FLUoxetine 40 MG capsule Commonly known as: PROZAC TAKE 1 CAPSULE BY MOUTH EVERY DAY What changed:  how much to take how to take this when to take this additional instructions   ipratropium-albuterol 0.5-2.5 (3) MG/3ML Soln Commonly known as: DUONEB Take 3 mLs by nebulization every 6 (six) hours as needed.   omeprazole 40 MG capsule Commonly known as: PRILOSEC Take 40 mg by mouth daily before breakfast.   OneTouch Verio test strip Generic drug: glucose blood CHECK SUGARS TWICE A DAY DX  DM2, INSULIN DEPENDANT   predniSONE 20 MG tablet Commonly known as: DELTASONE Take 0.5-2 tablets (10-40 mg total) by mouth daily with breakfast. Take 40 Mg daily for 4 days, Followed by 30 Mg daily for 1 week  followed by 20 Mg daily for 1 week followed by 10 Mg daily for 1 week then stop Start taking on: August 05, 2022   spironolactone 25 MG tablet Commonly known as: ALDACTONE Take 0.5 tablets (12.5 mg total) by mouth daily. Start taking on: August 05, 2022   torsemide 20 MG tablet Commonly known as: DEMADEX Take 1 tablet (20 mg total) by mouth daily. Start taking on: August 05, 2022   Ventolin HFA 108 (90 Base) MCG/ACT inhaler Generic drug: albuterol Inhale 2 puffs into the lungs every 6 (six) hours as needed for wheezing or shortness of breath.               Durable Medical Equipment  (From admission, onward)           Start     Ordered   07/27/22 1524  For home use only DME 4 wheeled rolling walker with seat  Once       Question:  Patient  needs a walker to treat with the following condition  Answer:  Heart failure ([place])   07/27/22 1523           Allergies  Allergen Reactions   Tramadol     Per Dr. Sharyon Medicus is renal failure, patient states that she is currently taking this medication and she has not had a reaction to Tramadol since this time.    Follow-up Information     [facility] Follow up on 08/19/2022.   Specialty: Cardiology Why: Advanced heart failure clinic 9:30 am Entrance C, Free valet parking Contact information: [address] [id number] mc Dorris [place] [phone]                 The results of significant diagnostics from this hospitalization (including imaging, microbiology, ancillary and laboratory) are listed below for reference.    Significant Diagnostic Studies: CT Chest High Resolution  Result Date: 07/28/2022 CLINICAL DATA:  Interstitial lung disease EXAM: CT CHEST WITHOUT CONTRAST TECHNIQUE: Multidetector CT imaging of the chest was performed following the standard protocol without intravenous contrast. High resolution imaging of the lungs, as well as inspiratory and expiratory imaging, was performed. RADIATION DOSE REDUCTION: This exam was performed according to the departmental dose-optimization program which includes automated exposure control, adjustment of the mA and/or kV according to patient size and/or use of iterative reconstruction technique. COMPARISON:  Chest CT dated January 06, 2022 FINDINGS: Cardiovascular: Normal heart size. Trace pericardial effusion. Normal caliber thoracic aorta with moderate calcified plaque. Coronary artery calcifications. Dilated main pulmonary artery, measuring up to 3.7 cm on series 5, image 65. Mediastinum/Nodes: Esophagus and thyroid are unremarkable. Mediastinal lymph nodes are increased in size when compared with prior exam. Reference right upper paratracheal lymph node  measuring 1.8 cm in short axis on series 5, image 46, previously measured 1.1 cm in short axis. Reference subcarinal lymph node measuring 2.0 cm in short axis, previously measured 1.1 cm. Lungs/Pleura: Central airways are patent. No definite air trapping, although expiratory phase images are technically inadequate. Moderate centrilobular emphysema. New diffuse mid and upper lung predominant ground-glass opacities with associated fissural thickening. Previously described reticular opacities are not well visualized due to superimposed acute opacities. No pleural effusion or pneumothorax. Upper Abdomen: No acute abnormality. Musculoskeletal: No chest wall mass or suspicious bone lesions identified. IMPRESSION: 1. New mid and upper lung predominant diffuse ground-glass opacities with associated fissural thickening, findings are favored to be due to pulmonary edema. Infectious or inflammatory  etiology are additional considerations. 2. Mediastinal lymph nodes are increased in size when compared with prior exam, likely reactive, but somewhat greater than size than expected. Recommend follow-up chest CT in 3 months to ensure resolution. 3. Dilated main pulmonary artery, findings can be seen in the setting of pulmonary hypertension. 4. Aortic Atherosclerosis (ICD10-I70.0) and Emphysema (ICD10-J43.9). Electronically Signed   By: Yetta Glassman M.D.   On: 07/28/2022 11:13   CARDIAC CATHETERIZATION  Result Date: 07/26/2022 1. Low PCWP 2. Mildly elevated right-sided filling pressures. 3. Mild pulmonary arterial hypertesion, suspect group 3 with parenchymal lung disease. 4. PAPI is normal. Predominant RV failure.   NM Pulmonary Perfusion  Result Date: 07/26/2022 CLINICAL DATA:  Shortness of breath question pulmonary embolism EXAM: NUCLEAR MEDICINE PERFUSION LUNG SCAN TECHNIQUE: Perfusion images were obtained in multiple projections after intravenous injection of radiopharmaceutical. Ventilation scans intentionally  deferred if perfusion scan and chest x-ray adequate for interpretation during COVID 19 epidemic. RADIOPHARMACEUTICALS:  3.8 mCi Tc-33mMAA IV COMPARISON:  Chest radiograph 07/26/2022 FINDINGS: Minimal peripheral irregularity of perfusion in the upper lobes slightly greater on RIGHT. Overall pattern is nonsegmental. Findings favor parenchymal lung disease. No segmental or subsegmental perfusion defects to suggest pulmonary embolism. IMPRESSION: Perfusion lung scan changes consistent with parenchymal lung disease, corresponding to chronic interstitial lung disease changes identified on chest radiographs. Pulmonary embolism absent. Electronically Signed   By: MLavonia DanaM.D.   On: 07/26/2022 15:28   DG Chest Port 1 View  Result Date: 07/26/2022 CLINICAL DATA:  Dyspnea. EXAM: PORTABLE CHEST 1 VIEW COMPARISON:  July 24, 2022. FINDINGS: Stable cardiomediastinal silhouette. Stable diffuse reticulonodular densities are noted throughout both lungs which may represent atypical inflammation or chronic interstitial lung disease. The visualized skeletal structures are unremarkable. IMPRESSION: Stable bilateral opacities as described above. Electronically Signed   By: JMarijo ConceptionM.D.   On: 07/26/2022 10:40   VAS [place]OWER EXTREMITY VENOUS (DVT)  Result Date: 07/24/2022  Lower Venous DVT Study Patient Name:  KYANEISY WENZ Date of Exam:   07/23/2022 Medical Rec #: [id number]          Accession #:    [id number] of Birth: 103-Nov-1962          Patient Gender: F Patient Age:   89years [facility]      VAS [place]OWER EXTREMITY VENOUS (DVT) Referring Phys: PWynetta Fines--------------------------------------------------------------------------------  Indications: Swelling, and Edema.  Comparison Study: no prior Performing Technologist: MArchie PattenRVS  Examination Guidelines: A complete evaluation includes B-mode imaging, spectral Doppler, color Doppler, and power Doppler as  needed of all accessible portions of each vessel. Bilateral testing is considered an integral part of a complete examination. Limited examinations for reoccurring indications may be performed as noted. The reflux portion of the exam is performed with the patient in reverse Trendelenburg.  +---------+---------------+---------+-----------+----------+-------------------+ RIGHT    CompressibilityPhasicitySpontaneityPropertiesThrombus Aging      +---------+---------------+---------+-----------+----------+-------------------+ CFV      Full           Yes      Yes                                      +---------+---------------+---------+-----------+----------+-------------------+ SFJ      Full                                                             +---------+---------------+---------+-----------+----------+-------------------+  FV Prox  Full                                                             +---------+---------------+---------+-----------+----------+-------------------+ FV Mid   Full                                                             +---------+---------------+---------+-----------+----------+-------------------+ FV DistalFull                                                             +---------+---------------+---------+-----------+----------+-------------------+ PFV      Full                                                             +---------+---------------+---------+-----------+----------+-------------------+ POP      Full           Yes      Yes                                      +---------+---------------+---------+-----------+----------+-------------------+ PTV      Full           Yes      Yes                                      +---------+---------------+---------+-----------+----------+-------------------+ PERO                                                  Not well visualized  +---------+---------------+---------+-----------+----------+-------------------+   +---------+---------------+---------+-----------+----------+--------------+ LEFT     CompressibilityPhasicitySpontaneityPropertiesThrombus Aging +---------+---------------+---------+-----------+----------+--------------+ CFV      Full           Yes      Yes                                 +---------+---------------+---------+-----------+----------+--------------+ SFJ      Full                                                        +---------+---------------+---------+-----------+----------+--------------+ FV Prox  Full                                                        +---------+---------------+---------+-----------+----------+--------------+  FV Mid   Full                                                        +---------+---------------+---------+-----------+----------+--------------+ FV DistalFull                                                        +---------+---------------+---------+-----------+----------+--------------+ PFV      Full                                                        +---------+---------------+---------+-----------+----------+--------------+ POP      Full           Yes      Yes                                 +---------+---------------+---------+-----------+----------+--------------+ PTV      Full                                                        +---------+---------------+---------+-----------+----------+--------------+ PERO     Full                                                        +---------+---------------+---------+-----------+----------+--------------+     Summary: BILATERAL: - No evidence of deep vein thrombosis seen in the lower extremities, bilaterally. -No evidence of popliteal cyst, bilaterally.   *See table(s) above for measurements and observations. Electronically signed by Orlie Pollen on 07/24/2022 at 1:30:58  PM.    Final    DG Chest 1 View  Result Date: 07/24/2022 CLINICAL DATA:  61 year old female with history of congestive heart failure. EXAM: CHEST  1 VIEW COMPARISON:  Chest x-ray 07/23/2022. FINDINGS: Lung volumes are low. Diffuse interstitial prominence and widespread peribronchial cuffing, similar to the prior study. No confluent consolidative airspace disease. No pleural effusions. No pneumothorax. Heart size is borderline enlarged. Upper mediastinal contours are within normal limits. Atherosclerotic calcifications are noted in the thoracic aorta. IMPRESSION: 1. Unchanged appearance of the lungs. This could reflect acute bronchitis with developing multilobar bronchopneumonia, however, findings may alternatively reflect progression of interstitial lung disease. 2. Aortic atherosclerosis. Electronically Signed   By: Vinnie Langton M.D.   On: 07/24/2022 08:04   ECHOCARDIOGRAM COMPLETE  Result Date: 07/23/2022    ECHOCARDIOGRAM REPORT   Patient Name:   ETHLEEN LORMAND Date of Exam: 07/23/2022 Medical Rec #:  [id number]          Height:       64.0 in Accession #:    [id number]         Weight:  220.0 lb Date of Birth:  [birth year]          BSA:          2.037 m Patient Age:    79 years           BP:           97/53 mmHg Patient Gender: F                  HR:           93 bpm. Exam Location:  Inpatient Procedure: 2D Echo, Color Doppler and Cardiac Doppler Indications:    acute systolic chf  History:        Patient has prior history of Echocardiogram examinations, most                 recent 07/13/2021. COPD; Risk Factors:Diabetes and Former                 Smoker.  Sonographer:    Johny Chess RDCS Referring Phys: [id number] Lequita Halt  Sonographer Comments: Patient is obese. Image acquisition challenging due to patient body habitus and Image acquisition challenging due to respiratory motion. IMPRESSIONS  1. Possible McConnell's sign; would consider evaluation to exclude pulmonary embolus.  2. Left  ventricular ejection fraction, by estimation, is 60 to 65%. The left ventricle has normal function. The left ventricle has no regional wall motion abnormalities. Left ventricular diastolic parameters are consistent with Grade I diastolic dysfunction (impaired relaxation). There is the interventricular septum is flattened in systole and diastole, consistent with right ventricular pressure and volume overload.  3. Right ventricular systolic function is severely reduced. The right ventricular size is moderately enlarged. There is moderately elevated pulmonary artery systolic pressure.  4. Right atrial size was moderately dilated.  5. The mitral valve is normal in structure. No evidence of mitral valve regurgitation. No evidence of mitral stenosis.  6. The aortic valve is tricuspid. Aortic valve regurgitation is not visualized. Aortic valve sclerosis is present, with no evidence of aortic valve stenosis.  7. The inferior vena cava is dilated in size with <50% respiratory variability, suggesting right atrial pressure of 15 mmHg. FINDINGS  Left Ventricle: Left ventricular ejection fraction, by estimation, is 60 to 65%. The left ventricle has normal function. The left ventricle has no regional wall motion abnormalities. The left ventricular internal cavity size was normal in size. There is  no left ventricular hypertrophy. The interventricular septum is flattened in systole and diastole, consistent with right ventricular pressure and volume overload. Left ventricular diastolic parameters are consistent with Grade I diastolic dysfunction (impaired relaxation). Right Ventricle: The right ventricular size is moderately enlarged. Right ventricular systolic function is severely reduced. There is moderately elevated pulmonary artery systolic pressure. The tricuspid regurgitant velocity is 2.77 m/s, and with an assumed right atrial pressure of 15 mmHg, the estimated right ventricular systolic pressure is 48.0 mmHg. Left Atrium:  Left atrial size was normal in size. Right Atrium: Right atrial size was moderately dilated. Pericardium: There is no evidence of pericardial effusion. Mitral Valve: The mitral valve is normal in structure. No evidence of mitral valve regurgitation. No evidence of mitral valve stenosis. Tricuspid Valve: The tricuspid valve is normal in structure. Tricuspid valve regurgitation is mild . No evidence of tricuspid stenosis. Aortic Valve: The aortic valve is tricuspid. Aortic valve regurgitation is not visualized. Aortic valve sclerosis is present, with no evidence of aortic valve stenosis. Pulmonic Valve: The pulmonic valve was normal in  structure. Pulmonic valve regurgitation is trivial. No evidence of pulmonic stenosis. Aorta: The aortic root is normal in size and structure. Venous: The inferior vena cava is dilated in size with less than 50% respiratory variability, suggesting right atrial pressure of 15 mmHg. IAS/Shunts: There is left bowing of the interatrial septum, suggestive of elevated right atrial pressure. No atrial level shunt detected by color flow Doppler. Additional Comments: Possible McConnell's sign; would consider evaluation to exclude pulmonary embolus.  LEFT VENTRICLE PLAX 2D LVIDd:         4.00 cm   Diastology LVIDs:         2.20 cm   LV e' medial:    6.64 cm/s LV PW:         0.90 cm   LV E/e' medial:  7.8 LV IVS:        1.00 cm   LV e' lateral:   8.92 cm/s LVOT diam:     2.00 cm   LV E/e' lateral: 5.8 LV SV:         51 LV SV Index:   25 LVOT Area:     3.14 cm  RIGHT VENTRICLE            IVC RV Basal diam:  2.60 cm    IVC diam: 2.80 cm RV S prime:     7.40 cm/s TAPSE (M-mode): 1.4 cm LEFT ATRIUM             Index        RIGHT ATRIUM           Index LA diam:        3.20 cm 1.57 cm/m   RA Area:     17.80 cm LA Vol (A2C):   42.7 ml 20.96 ml/m  RA Volume:   51.50 ml  25.28 ml/m LA Vol (A4C):   26.5 ml 13.01 ml/m LA Biplane Vol: 35.0 ml 17.18 ml/m  AORTIC VALVE LVOT Vmax:   109.00 cm/s LVOT Vmean:   67.500 cm/s LVOT VTI:    0.161 m  AORTA Ao Root diam: 2.90 cm Ao Asc diam:  2.70 cm MITRAL VALVE               TRICUSPID VALVE MV Area (PHT): 4.06 cm    TR Peak grad:   30.7 mmHg MV Decel Time: 187 msec    TR Vmax:        277.00 cm/s MV E velocity: 52.10 cm/s MV A velocity: 70.40 cm/s  SHUNTS MV E/A ratio:  0.74        Systemic VTI:  0.16 m                            Systemic Diam: 2.00 cm Kirk Ruths MD Electronically signed by Kirk Ruths MD Signature Date/Time: 07/23/2022/6:07:13 PM    Final    US Abdomen Limited RUQ (LIVER/GB)  Result Date: 07/23/2022 CLINICAL DATA:  Cirrhosis in a 61 year old female by report. EXAM: ULTRASOUND ABDOMEN LIMITED RIGHT UPPER QUADRANT COMPARISON:  Recent renal sonogram and CT of the chest which was acquired in April of 2023. FINDINGS: Gallbladder: Gallbladder is surgically absent. Common bile duct: Diameter: 3.4 mm Liver: Heterogeneous hepatic echotexture with coarse appearance. Increased hepatic echogenicity. Question of mild surface nodularity. Note that the patient as a large body habitus and was unable to hold her breath for the evaluation which further limits assessment. Very limited imaging of the liver without visible lesion  to the extent evaluated. Portal vein is patent on color Doppler imaging with normal direction of blood flow towards the liver. Other: No visible ascites. IMPRESSION: Highly limited evaluation due to respiratory variation and large patient body habitus. Coarsened hepatic echotexture and question of surface nodularity raising the question of liver disease/cirrhosis. There is also increased echogenicity which could be related to underlying hepatic steatosis. There is high concern for parenchymal abnormality cross-sectional imaging may be helpful on follow-up for further assessment. No biliary duct dilation. Electronically Signed   By: Zetta Bills M.D.   On: 07/23/2022 16:39   US RENAL  Result Date: 07/23/2022 CLINICAL DATA:  Acute renal  insufficiency EXAM: RENAL / URINARY TRACT ULTRASOUND COMPLETE COMPARISON:  12/18/2019 FINDINGS: Right Kidney: Renal measurements: 10.6 x 4.1 x 5.2 cm = volume: 119.7 mL. Echogenicity within normal limits. No mass or hydronephrosis visualized. Left Kidney: Renal measurements: 9.4 x 4.7 by 5.4 cm = volume: 123.7 mL. Echogenicity within normal limits. No mass or hydronephrosis visualized. Bladder: Appears normal for degree of bladder distention. Other: None. IMPRESSION: 1. Unremarkable renal ultrasound. Electronically Signed   By: Randa Ngo M.D.   On: 07/23/2022 13:29   DG Chest Portable 1 View  Result Date: 07/23/2022 CLINICAL DATA:  Shortness of breath. EXAM: PORTABLE CHEST 1 VIEW COMPARISON:  May 01, 2013. FINDINGS: Mild cardiomegaly is noted. Mildly diffuse interstitial densities are noted throughout both lungs concerning for pulmonary edema or possibly atypical inflammation. Bony thorax is unremarkable. IMPRESSION: Mild diffuse interstitial densities are noted bilaterally concerning for pulmonary edema or possibly atypical inflammation. Electronically Signed   By: Marijo Conception M.D.   On: 07/23/2022 10:35    Microbiology: Recent Results (from the past 240 hour(s))  Respiratory (~20 pathogens) panel by PCR     Status: None   Collection Time: 07/25/22  6:15 PM   Specimen: Nasopharyngeal Swab; Respiratory  Result Value Ref Range Status   Adenovirus NOT DETECTED NOT DETECTED Final   Coronavirus 229E NOT DETECTED NOT DETECTED Final    Comment: (NOTE) The Coronavirus on the Respiratory Panel, DOES NOT test for the novel  Coronavirus (2019 nCoV)    Coronavirus HKU1 NOT DETECTED NOT DETECTED Final   Coronavirus NL63 NOT DETECTED NOT DETECTED Final   Coronavirus OC43 NOT DETECTED NOT DETECTED Final   Metapneumovirus NOT DETECTED NOT DETECTED Final   Rhinovirus / Enterovirus NOT DETECTED NOT DETECTED Final   Influenza A NOT DETECTED NOT DETECTED Final   Influenza B NOT DETECTED NOT DETECTED  Final   Parainfluenza Virus 1 NOT DETECTED NOT DETECTED Final   Parainfluenza Virus 2 NOT DETECTED NOT DETECTED Final   Parainfluenza Virus 3 NOT DETECTED NOT DETECTED Final   Parainfluenza Virus 4 NOT DETECTED NOT DETECTED Final   Respiratory Syncytial Virus NOT DETECTED NOT DETECTED Final   Bordetella pertussis NOT DETECTED NOT DETECTED Final   Bordetella Parapertussis NOT DETECTED NOT DETECTED Final   Chlamydophila pneumoniae NOT DETECTED NOT DETECTED Final   Mycoplasma pneumoniae NOT DETECTED NOT DETECTED Final    Comment: Performed at [facility], [address] [place], [place] [address]     Labs: Basic Metabolic Panel: Recent Labs  Lab 07/31/22 0029 08/01/22 0020 08/02/22 0045 08/03/22 0039 08/04/22 0049  NA 135 134* 133* 136 136  K 4.1 4.7 4.0 3.7 4.0  CL 95* 94* 92* 92* 96*  CO2 _0 GLUCOSE 233* 258* 194* 164* 147*  BUN 40* 45* 47* 46* 47*  CREATININE 1.16* 1.28* 1.08* 1.10* 1.08*  CALCIUM 9.4 9.8 9.2 9.8 9.6   Liver Function Tests: No results for input(s): "AST", "ALT", "ALKPHOS", "BILITOT", "PROT", "ALBUMIN" in the last 168 hours. No results for input(s): "LIPASE", "AMYLASE" in the last 168 hours. No results for input(s): "AMMONIA" in the last 168 hours. CBC: Recent Labs  Lab 07/29/22 0055 07/30/22 0038  WBC 9.0 7.3  HGB 11.6* 12.0  HCT 37.4 39.2  MCV 88.0 89.5  PLT 280 303   Cardiac Enzymes: No results for input(s): "CKTOTAL", "CKMB", "CKMBINDEX", "TROPONINI" in the last 168 hours. BNP: BNP (last 3 results) Recent Labs    07/23/22 0952 07/28/22 0121  BNP 905.0* 625.8*    ProBNP (last 3 results) No results for input(s): "PROBNP" in the last 8760 hours.  CBG: Recent Labs  Lab 08/03/22 0613 08/03/22 1107 08/03/22 1610 08/03/22 2103 08/04/22 0614  GLUCAP 135* 255* 230* 202* 125*       Signed:  Domenic Polite MD.  [facility] 08/04/2022, 10:56 AM

## 2022-08-04 NOTE — TOC Progression Note (Addendum)
Transition of Care [facility]) - Progression Note    Patient Details  Name: [name] MRN: [id number] Date of Birth: [birth year]  Transition of Care [facility]) CM/SW Contact  Zenon Mayo, RN Phone Number: 08/04/2022, 12:06 PM  Clinical Narrative:    Per HF NCM , left bipap order for MD to sign.  Per Cyril Mourning with [employer], states it is safe to dc patient, and that she was coming to hospital to get the overnight pulse ox.  MD aware needs to sign bipap order. NCM offered choice with Medicare. Gov list for [facility], she does not have a preference.  NCM made referral to [facility] with [facility]. Soc will begin 24 to 48 hrs post dc.  She states her BIL will be transporting her home.  Patient will also need home oxygen, Kristen with [employer] states she will take care of getting the oxygen for patient.   The PFT's were not done,  Resp did not know about them needing to be done, they said you have to call them to notify them of this , even if order is in.  Resp was kind enough to do this at bedside and patient does not qualify for a NIV but does qualify for a bipap.  Kim with [employer] states she will see if she can get resp therapy with [employer] to come and set this up for patient at the hospital today before dc.  The patient will have a loner til the [place] goes thru.    Expected Discharge Plan: Home/Self Care Barriers to Discharge: Continued Medical Work up  Expected Discharge Plan and Services Expected Discharge Plan: Home/Self Care   Discharge Planning Services: CM Consult   Living arrangements for the past 2 months: Single Family Home Expected Discharge Date: 08/04/22                                     Social Determinants of Health (SDOH) Interventions    Readmission Risk Interventions     No data to display

## 2022-08-04 NOTE — Research (Signed)
[facility] Lung scan Informed Consent   Subject Name: [name]  Subject met inclusion and exclusion criteria.  The informed consent form, study requirements and expectations were reviewed with the subject and questions and concerns were addressed prior to the signing of the consent form.  The subject verbalized understanding of the trial requirements.  The subject agreed to participate in the [facility] lung scan trial and signed the informed consent.  The informed consent was obtained prior to performance of any protocol-specific procedures for the subject.  A copy of the signed informed consent was given to the subject and a copy was placed in the subject's medical record.  [name] 08/03/2022,4:30 PM  Lung scans completed.

## 2022-08-04 NOTE — Progress Notes (Signed)
   08/04/22 1000  Mobility  Activity Ambulated with assistance in hallway  Level of Assistance Modified independent, requires aide device or extra time  Assistive Device Four wheel walker  Distance Ambulated (ft) 115 ft  Activity Response Tolerated well  Mobility Referral Yes  $Mobility charge 1 Mobility   Mobility Specialist Progress Note  Received pt in chair having no complaints and agreeable to mobility. Pt was asymptomatic throughout ambulation and returned to room w/o fault. Left in chair w/ call bell in reach and all needs met.   Lucious Groves Mobility Specialist

## 2022-08-05 ENCOUNTER — Encounter (HOSPITAL_COMMUNITY): Payer: Self-pay | Admitting: Psychiatry

## 2022-08-05 ENCOUNTER — Telehealth (HOSPITAL_BASED_OUTPATIENT_CLINIC_OR_DEPARTMENT_OTHER): Payer: HMO | Admitting: Psychiatry

## 2022-08-05 VITALS — Wt 190.0 lb

## 2022-08-05 DIAGNOSIS — F331 Major depressive disorder, recurrent, moderate: Secondary | ICD-10-CM

## 2022-08-05 DIAGNOSIS — F411 Generalized anxiety disorder: Secondary | ICD-10-CM | POA: Diagnosis not present

## 2022-08-05 DIAGNOSIS — F4310 Post-traumatic stress disorder, unspecified: Secondary | ICD-10-CM

## 2022-08-05 MED ORDER — DOXEPIN HCL 100 MG PO CAPS: 100.0000 mg | ORAL_CAPSULE | Freq: Every day | ORAL | 1 refills | Status: DC

## 2022-08-05 MED ORDER — FLUOXETINE HCL 40 MG PO CAPS: ORAL_CAPSULE | ORAL | 1 refills | Status: DC

## 2022-08-05 MED ORDER — ARIPIPRAZOLE 5 MG PO TABS: 5.0000 mg | ORAL_TABLET | Freq: Every day | ORAL | 1 refills | Status: DC

## 2022-08-05 MED ORDER — CLONAZEPAM 0.25 MG PO TBDP: 0.2500 mg | ORAL_TABLET | Freq: Two times a day (BID) | ORAL | 0 refills | Status: DC | PRN
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
## 2022-08-10 NOTE — Telephone Encounter (Signed)
Patient is scheduled for a HFU on 12/7 at 1:30pm with BQ- patient is aware and reminder mailed. Nothing further needed.

## 2022-08-16 ENCOUNTER — Encounter: Payer: Self-pay | Admitting: Internal Medicine

## 2022-08-19 ENCOUNTER — Encounter (HOSPITAL_COMMUNITY): Payer: HMO

## 2022-08-19 NOTE — Progress Notes (Incomplete)
PCP: Primary Cardiologist:  HPI:       ROS: All systems negative except as listed in HPI, PMH and Problem List.  SH:  Social History   Socioeconomic History   Marital status: Married    Spouse name: Not on file   Number of children: Not on file   Years of education: Not on file   Highest education level: Not on file  Occupational History   Not on file  Tobacco Use   Smoking status: Former    Packs/day: 0.50    Types: Cigarettes    Quit date: 2010    Years since quitting: 13.9   Smokeless tobacco: Never   Tobacco comments:    about one pack a day on a bad day  Vaping Use   Vaping Use: Never used  Substance and Sexual Activity   Alcohol use: No   Drug use: No   Sexual activity: Never    Birth control/protection: None  Other Topics Concern   Not on file  Social History Narrative   Not on file   Social Determinants of Health   Financial Resource Strain: Not on file  Food Insecurity: No Food Insecurity (07/25/2022)   Hunger Vital Sign    Worried About Running Out of Food in the Last Year: Never true    Ran Out of Food in the Last Year: Never true  Transportation Needs: No Transportation Needs (07/25/2022)   PRAPARE - Hydrologist (Medical): No    Lack of Transportation (Non-Medical): No  Physical Activity: Not on file  Stress: Not on file  Social Connections: Not on file  Intimate Partner Violence: Not At Risk (07/25/2022)   Humiliation, Afraid, Rape, and Kick questionnaire    Fear of Current or Ex-Partner: No    Emotionally Abused: No    Physically Abused: No    Sexually Abused: No    FH:  Family History  Problem Relation Age of Onset   Diabetes Mother    Throat cancer Mother    Uterine cancer Sister    Deep vein thrombosis Neg Hx    Pulmonary embolism Neg Hx     Past Medical History:  Diagnosis Date   Acid reflux disease    Anxiety    Asthma    Bronchitis, chronic (HCC)    Effects worse with URI   Chronic pain  syndrome    COPD (chronic obstructive pulmonary disease) ([place])    Depression    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, type 2   Diverticulitis    Dysplasia of cervix (uteri)    patient states she had dysplasia of the uterus stage 4   Endometrial cancer (HCC)    Fibromyalgia    Fibromyalgia    GERD (gastroesophageal reflux disease)    Headache(784.0)    migraine headache   Hyperlipidemia    Hypertension    Kidney stones    at least 6 stones in past   Pneumonia 2019   Restless leg syndrome    Bilateral   Vomiting    last night at 2300 after last round of antibiotic    Current Outpatient Medications  Medication Sig Dispense Refill   acetaminophen (TYLENOL) 500 MG tablet Take 500 mg by mouth every 6 (six) hours as needed.     apixaban (ELIQUIS) 5 MG TABS tablet Take 1 tablet (5 mg total) by mouth 2 (two) times daily. 60 tablet 0   ARIPiprazole (ABILIFY) 5 MG tablet  Take 1 tablet (5 mg total) by mouth daily. 30 tablet 1   aspirin EC 81 MG tablet Take one (1) tablet by mouth daily Oral     atorvastatin (LIPITOR) 40 MG tablet Take 40 mg by mouth at bedtime.      budesonide (PULMICORT) 0.25 MG/2ML nebulizer solution Use 1 vial (0.25 mg total) by nebulization 2 (two) times daily. 120 mL 0   clonazePAM (KLONOPIN) 0.25 MG disintegrating tablet Take 1 tablet (0.25 mg total) by mouth 2 (two) times daily as needed (anxiety). 60 tablet 0   dapagliflozin propanediol (FARXIGA) 10 MG TABS tablet Take 1 tablet (10 mg total) by mouth daily. 30 tablet 0   dicyclomine (BENTYL) 10 MG capsule Take 10 mg by mouth every 8 (eight) hours as needed for spasms (abdominal spasms).      doxepin (SINEQUAN) 100 MG capsule Take 1 capsule (100 mg total) by mouth at bedtime. 30 capsule 1   FLUoxetine (PROZAC) 40 MG capsule TAKE 1 CAPSULE BY MOUTH EVERY DAY 30 capsule 1   ipratropium-albuterol (DUONEB) 0.5-2.5 (3) MG/3ML SOLN Use 1 vial by nebulization every 6 (six) hours as needed. 360 mL 0    omeprazole (PRILOSEC) 40 MG capsule Take 40 mg by mouth daily before breakfast.   6   ONETOUCH VERIO test strip CHECK SUGARS TWICE A DAY DX  DM2, INSULIN DEPENDANT     predniSONE (DELTASONE) 20 MG tablet Take 40 mg (2 tabs) daily for 4 days, followed by 30 mg (1.5 tabs) daily for 1 week, followed by 20 mg (1 tab) daily for 1 week, followed by 10 mg (0.5 tab) daily for 1 week then stop 60 tablet 1   spironolactone (ALDACTONE) 25 MG tablet Take 0.5 tablets (12.5 mg total) by mouth daily. 30 tablet 0   torsemide (DEMADEX) 20 MG tablet Take 1 tablet (20 mg total) by mouth daily. 30 tablet 0   VENTOLIN HFA 108 (90 BASE) MCG/ACT inhaler Inhale 2 puffs into the lungs every 6 (six) hours as needed for wheezing or shortness of breath.      No current facility-administered medications for this visit.    There were no vitals filed for this visit.  PHYSICAL EXAM:  General:  Well appearing. No resp difficulty HEENT: normal Neck: supple. JVP flat. Carotids 2+ bilaterally; no bruits. No lymphadenopathy or thryomegaly appreciated. Cor: PMI normal. Regular rate & rhythm. No rubs, gallops or murmurs. Lungs: clear Abdomen: soft, nontender, nondistended. No hepatosplenomegaly. No bruits or masses. Good bowel sounds. Extremities: no cyanosis, clubbing, rash, edema Neuro: alert & orientedx3, cranial nerves grossly intact. Moves all 4 extremities w/o difficulty. Affect pleasant.   ECG:   ASSESSMENT & PLAN:   1. Acute hypoxemic respiratory failure: Now on 12 L oxygen.  Has COPD, but does not wear oxygen at home. She was smoking up to this admission.  Suspect combination of AECOPD (emphysema, smoking-related ILD) and RV failure/cor pulmonale.  No evidence for PE by V/Q scan. RHC with low PCWP and elevated right heart pressures, suggesting cor pulmonale.  Given that she still has significant oxygen requirement, it appears that her lung parenchymal disease is quite significant. Elevated ESR and CRP, treating for  component of inflammatory lung disease with steroids.  Oxygen requirement down to 4L [place].  - Started on empiric steroids by [facility] 11/09. Now on prednisone taper.  2. RV failure/cor pulmonale: Echo showed EF 60-65%, D-shaped septum, moderate RV enlargement with severely decreased RV systolic function, IVC dilated. RHC with low PCWP,  elevated right-sided filling pressures, mild PAH, preserved cardiac output. Suspect RV failure due to parenchymal lung disease (emphysema, smoking-related ILD noted on HRCT 4/23).  She appears well-diuresed at this point with weight significantly down. Creatinine stable. She does not appear volume overloaded on exam.  - Continue torsemide 20 mg daily.   - Continue spironolactone 12.5 daily.  - Continue Farxiga 10 mg daily.   - BP stable, continue midodrine 2.5 mg tid.  3. 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  HRCT 4/23 showed emphysema and smoking-related ILD.  V/Q scan did not show evidence for PE.  - Does not appear to need further diuresis - Continue supplemental O2. Notes she did not require supplemental O2 prior to admission 4. COPD: Emphysema on chest imaging.  Active smoker at admission.  - Needs to quit smoking.  5. Atrial fibrillation: Paroxysmal. She is in NSR.  - Continue  Eliquis.  Co-Pay $45.00. She will need 30 day free card.  6. AKI: Creatinine 2.6 at admission, down to 1.08 today. Follow closely, suspect cardiorenal syndrome.  7. Cirrhosis: Possible cirrhosis by RUQ [place].  This may be due to RV failure.

## 2022-08-26 ENCOUNTER — Ambulatory Visit: Payer: HMO | Admitting: Pulmonary Disease

## 2022-08-26 ENCOUNTER — Encounter: Payer: Self-pay | Admitting: Pulmonary Disease

## 2022-08-26 VITALS — BP 108/60 | HR 99 | Temp 97.7°F | Ht 63.0 in | Wt 185.4 lb

## 2022-08-26 DIAGNOSIS — J9611 Chronic respiratory failure with hypoxia: Secondary | ICD-10-CM | POA: Diagnosis not present

## 2022-08-26 DIAGNOSIS — J84115 Respiratory bronchiolitis interstitial lung disease: Secondary | ICD-10-CM | POA: Diagnosis not present

## 2022-08-26 DIAGNOSIS — R590 Localized enlarged lymph nodes: Secondary | ICD-10-CM

## 2022-08-26 DIAGNOSIS — F17210 Nicotine dependence, cigarettes, uncomplicated: Secondary | ICD-10-CM | POA: Diagnosis not present

## 2022-08-26 DIAGNOSIS — J849 Interstitial pulmonary disease, unspecified: Secondary | ICD-10-CM | POA: Diagnosis not present

## 2022-08-26 NOTE — Patient Instructions (Addendum)
Acute pneumonitis, presumably smoking-related lung disease (DIP versus RB ILD) Wean off prednisone this week as directed during her hospital visit Repeat CT chest in February Let me know if you have worsening shortness of breath, cough or other respiratory symptoms after stopping prednisone  Mediastinal adenopathy: Reactive Repeat CT chest February  Chronic respiratory failure with hypoxemia: For now continue 2 L of oxygen continuously We will check your oxygen level while walking on the next visit  Cigarette smoking: Congratulations on quitting smoking, we will refer you to lung cancer screening in 2024  We will see you back in 2 months or sooner if needed

## 2022-08-26 NOTE — Progress Notes (Signed)
Synopsis: Seen by [facility] during a hospitalization in November 2023 for acute hypoxemic respiratory failure.  During that time she was noted to have symmetric bilateral pulmonary opacities, right heart catheterization showed pulmonary hypertension with preserved wedge pressure, echocardiogram showed a normal LVEF but elevated RV size and function.  She was felt to have smoking-related inflammatory lung disease and was treated with steroids and oxygen. After her hospitalization she quit smoking.  Prior to that she had been smoking 2 packs of cigarettes daily  Subjective:   PATIENT ID: [name] GENDER: female DOB: Apr 04, 1961, MRN: [id number]   HPI  Chief Complaint  Patient presents with   [facility] f/u. Fluid in lungs, congestive heart failure.    [name] says that since coming home she is feeling much better than prior to her hospital visit.  Specifically she really does not have any shortness of breath anymore.  She will have periods of nausea and anxiety which she thinks is related to her abnormal heart rhythm.  She is not coughing, no fever, no chills, no mucus production.  She is on her last week of prednisone this week.  She was doing using 2 L of oxygen continuously.  She tells me that she has not worked in many years, she was formally a Scientist, research (medical).  She used to smoke 2 packs of cigarettes daily leading up to her hospital visit.  She says that she has completely quit smoking and she does not want to go back to smoking ever again.  Record review: Hospital records from her November 2023 hospitalization reviewed where the patient was seen in the context of inflammatory lung disease causing acute hypoxemic respiratory failure.  Also seen by cardiology and worked up for pulmonary hypertension.  Treated with steroids and had improvement in oxygenation from 12 L of oxygen per minute down to 4 L of oxygen per minute.  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, type 2   Diverticulitis    Dysplasia of cervix (uteri)    patient states she had dysplasia of the uterus stage 4   Endometrial cancer (HCC)    Fibromyalgia    Fibromyalgia    GERD (gastroesophageal reflux disease)    Headache(784.0)    migraine headache   Hyperlipidemia    Hypertension    Kidney stones    at least 6 stones in past   Pneumonia 2019   Restless leg syndrome    Bilateral   Vomiting    last night at 2300 after last round of antibiotic     Family History  Problem Relation Age of Onset   Diabetes Mother    Throat cancer Mother    Uterine cancer Sister    Deep vein thrombosis Neg Hx    Pulmonary embolism Neg Hx      Social History   Socioeconomic History   Marital status: Married    Spouse name: Not on file   Number of children: Not on file   Years of education: Not on file   Highest education level: Not on file  Occupational History   Not on file  Tobacco Use   Smoking  status: Former    Packs/day: 0.50    Types: Cigarettes    Quit date: 2010    Years since quitting: 13.9   Smokeless tobacco: Never   Tobacco comments:    about one pack a day on a bad day  Vaping Use   Vaping Use: Never used  Substance and Sexual Activity   Alcohol use: No   Drug use: No   Sexual activity: Never    Birth control/protection: None  Other Topics Concern   Not on file  Social History Narrative   Not on file   Social Determinants of Health   Financial Resource Strain: Not on file  Food Insecurity: No Food Insecurity (07/25/2022)   Hunger Vital Sign    Worried About Running Out of Food in the Last Year: Never true    Ran Out of Food in the Last Year: Never true  Transportation Needs: No Transportation Needs  (07/25/2022)   PRAPARE - Hydrologist (Medical): No    Lack of Transportation (Non-Medical): No  Physical Activity: Not on file  Stress: Not on file  Social Connections: Not on file  Intimate Partner Violence: Not At Risk (07/25/2022)   Humiliation, Afraid, Rape, and Kick questionnaire    Fear of Current or Ex-Partner: No    Emotionally Abused: No    Physically Abused: No    Sexually Abused: No     Allergies  Allergen Reactions   Tramadol     Per Dr. Sharyon Medicus is renal failure, patient states that she is currently taking this medication and she has not had a reaction to Tramadol since this time.     Outpatient Medications Prior to Visit  Medication Sig Dispense Refill   acetaminophen (TYLENOL) 500 MG tablet Take 500 mg by mouth every 6 (six) hours as needed.     apixaban (ELIQUIS) 5 MG TABS tablet Take 1 tablet (5 mg total) by mouth 2 (two) times daily. 60 tablet 0   ARIPiprazole (ABILIFY) 5 MG tablet Take 1 tablet (5 mg total) by mouth daily. 30 tablet 1   aspirin EC 81 MG tablet Take one (1) tablet by mouth daily Oral     atorvastatin (LIPITOR) 40 MG tablet Take 40 mg by mouth at bedtime.      budesonide (PULMICORT) 0.25 MG/2ML nebulizer solution Use 1 vial (0.25 mg total) by nebulization 2 (two) times daily. 120 mL 0   clonazePAM (KLONOPIN) 0.25 MG disintegrating tablet Take 1 tablet (0.25 mg total) by mouth 2 (two) times daily as needed (anxiety). 60 tablet 0   dapagliflozin propanediol (FARXIGA) 10 MG TABS tablet Take 1 tablet (10 mg total) by mouth daily. 30 tablet 0   dicyclomine (BENTYL) 10 MG capsule Take 10 mg by mouth every 8 (eight) hours as needed for spasms (abdominal spasms).      doxepin (SINEQUAN) 100 MG capsule Take 1 capsule (100 mg total) by mouth at bedtime. 30 capsule 1   FLUoxetine (PROZAC) 40 MG capsule TAKE 1 CAPSULE BY MOUTH EVERY DAY 30 capsule 1   ipratropium-albuterol (DUONEB) 0.5-2.5 (3) MG/3ML SOLN Use 1 vial by  nebulization every 6 (six) hours as needed. 360 mL 0   omeprazole (PRILOSEC) 40 MG capsule Take 40 mg by mouth daily before breakfast.   6   ONETOUCH VERIO test strip CHECK SUGARS TWICE A DAY DX  DM2, INSULIN DEPENDANT     predniSONE (DELTASONE) 20 MG tablet Take 40 mg (2 tabs) daily for 4  days, followed by 30 mg (1.5 tabs) daily for 1 week, followed by 20 mg (1 tab) daily for 1 week, followed by 10 mg (0.5 tab) daily for 1 week then stop 60 tablet 1   spironolactone (ALDACTONE) 25 MG tablet Take 0.5 tablets (12.5 mg total) by mouth daily. 30 tablet 0   torsemide (DEMADEX) 20 MG tablet Take 1 tablet (20 mg total) by mouth daily. 30 tablet 0   VENTOLIN HFA 108 (90 BASE) MCG/ACT inhaler Inhale 2 puffs into the lungs every 6 (six) hours as needed for wheezing or shortness of breath.      No facility-administered medications prior to visit.    Review of Systems  Constitutional:  Positive for malaise/fatigue. Negative for chills, fever and weight loss.  HENT:  Negative for congestion, nosebleeds, sinus pain and sore throat.   Eyes:  Negative for photophobia, pain and discharge.  Respiratory:  Negative for cough, hemoptysis, sputum production, shortness of breath and wheezing.   Cardiovascular:  Negative for chest pain, palpitations, orthopnea and leg swelling.  Gastrointestinal:  Negative for abdominal pain, constipation, diarrhea, nausea and vomiting.  Genitourinary:  Negative for dysuria, frequency, hematuria and urgency.  Musculoskeletal:  Negative for back pain, joint pain, myalgias and neck pain.  Skin:  Negative for itching and rash.  Neurological:  Negative for tingling, tremors, sensory change, speech change, focal weakness, seizures, weakness and headaches.  Psychiatric/Behavioral:  Negative for memory loss, substance abuse and suicidal ideas. The patient is not nervous/anxious.       Objective:  Physical Exam   Vitals:   08/26/22 1311  BP: 108/60  Pulse: 99  Temp: 97.7 F  (36.5 C)  TempSrc: Oral  SpO2: 98%  Weight: 185 lb 6.4 oz (84.1 kg)  Height: '5\' 3"'$  (1.6 m)   2L [place]  Gen: well appearing HENT: OP clear, neck supple PULM: CTA B, normal effort  CV: RRR, no mgr GI: BS+, soft, nontender Derm: no cyanosis or rash Psyche: normal mood and affect   CBC    Component Value Date/Time   WBC 7.3 07/30/2022 0038   RBC 4.38 07/30/2022 0038   HGB 12.0 07/30/2022 0038   HGB 11.6 (L) 11/22/2019 1350   HCT 39.2 07/30/2022 0038   PLT 303 07/30/2022 0038   PLT 269 11/22/2019 1350   MCV 89.5 07/30/2022 0038   MCH 27.4 07/30/2022 0038   MCHC 30.6 07/30/2022 0038   RDW 18.5 (H) 07/30/2022 0038   LYMPHSABS 1.1 07/23/2022 0952   MONOABS 1.0 07/23/2022 0952   EOSABS 0.0 07/23/2022 0952   BASOSABS 0.0 07/23/2022 0952     Chest imaging: April 2023 high-resolution CT chest images independently reviewed showing upper lobe predominant centrilobular emphysema, subpleural reticular change bilaterally, overall felt not to be consistent with UIP, favored smoking-related lung disease. November 2023 high-resolution CT chest images independently reviewed showing centrilobular emphysema, upper lobe predominant diffuse groundglass opacification, no honeycombing or significant interlobular septal thickening.  In the bases of the lungs the groundglass is more patchy. Increased adenopathy noted  PFT: November 2023 spirometry ratio 83%, FVC 1.79 L 55% predicted  Labs: July 26, 2022 ANA negative, double-stranded DNA negative, RF slightly elevated, anti-SCL 70 negative,  July 23, 2022 eosinophil count 0  Path:  Echo: November 2023 LVEF 60 to 65%, diminished RV function.  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  The most likely explanation was smoking-related lung disease as prior CT scans of the chest head shows some evidence of diffuse parenchymal lung disease but very mild.  The pattern from a radiographic standpoint did not meet any particularly easily identified problem.  With upper lobe predominance hypersensitivity pneumonitis comes to mind though I agree with the presumptive diagnosis during hospitalization of smoking-related lung disease given the patient's extremely high tobacco exposure prior to the hospital visit.  Lab work did not reveal any evidence of an underlying connective tissue disease.  We will need to follow this to ensure that it resolves with staying away from cigarettes.  We will continue to slowly taper off prednisone.  Plan: Acute pneumonitis, presumably smoking-related lung disease (DIP versus RB ILD) Wean off prednisone this week as directed during her hospital visit Repeat CT chest in February Let me know if you have worsening shortness of breath, cough or other respiratory symptoms after stopping prednisone  Mediastinal adenopathy: Reactive Repeat CT chest February  Chronic respiratory failure with hypoxemia: For now continue 2 L of oxygen continuously We will check your oxygen level while walking on the next visit  Cigarette smoking: Congratulations on quitting smoking, we will refer you to lung cancer screening in 2024  We will see you back in 2 months or sooner if needed  Immunizations: Immunization History  Administered Date(s) Administered   Influenza,inj,Quad PF,6+ Mos 06/08/2017   Pneumococcal Polysaccharide-23 07/22/2011   Tdap 07/01/2015     Current Outpatient Medications:    acetaminophen  (TYLENOL) 500 MG tablet, Take 500 mg by mouth every 6 (six) hours as needed., Disp: , Rfl:    apixaban (ELIQUIS) 5 MG TABS tablet, Take 1 tablet (5 mg total) by mouth 2 (two) times daily., Disp: 60 tablet, Rfl: 0   ARIPiprazole (ABILIFY) 5 MG tablet, Take 1 tablet (5 mg total) by mouth daily., Disp: 30 tablet, Rfl: 1   aspirin EC 81 MG tablet, Take one (1) tablet by mouth daily Oral, Disp: , Rfl:    atorvastatin (LIPITOR) 40 MG tablet, Take 40 mg by mouth at bedtime. , Disp: , Rfl:    budesonide (PULMICORT) 0.25 MG/2ML nebulizer solution, Use 1 vial (0.25 mg total) by nebulization 2 (two) times daily., Disp: 120 mL, Rfl: 0   clonazePAM (KLONOPIN) 0.25 MG disintegrating tablet, Take 1 tablet (0.25 mg total) by mouth 2 (two) times daily as needed (anxiety)., Disp: 60 tablet, Rfl: 0   dapagliflozin propanediol (FARXIGA) 10 MG TABS tablet, Take 1 tablet (10 mg total) by mouth daily., Disp: 30 tablet, Rfl: 0   dicyclomine (BENTYL) 10 MG capsule, Take 10 mg by mouth every 8 (eight) hours as needed for spasms (abdominal spasms). , Disp: , Rfl:    doxepin (SINEQUAN) 100 MG capsule, Take 1 capsule (100 mg total) by mouth at bedtime., Disp: 30 capsule, Rfl: 1   FLUoxetine (PROZAC) 40 MG capsule, TAKE 1 CAPSULE BY MOUTH EVERY DAY, Disp: 30 capsule, Rfl: 1   ipratropium-albuterol (DUONEB) 0.5-2.5 (3) MG/3ML SOLN, Use 1 vial by nebulization every 6 (six) hours as needed., Disp: 360 mL, Rfl: 0   omeprazole (PRILOSEC) 40 MG capsule, Take 40 mg by mouth daily before  breakfast. , Disp: , Rfl: 6   ONETOUCH VERIO test strip, CHECK SUGARS TWICE A DAY DX  DM2, INSULIN DEPENDANT, Disp: , Rfl:    predniSONE (DELTASONE) 20 MG tablet, Take 40 mg (2 tabs) daily for 4 days, followed by 30 mg (1.5 tabs) daily for 1 week, followed by 20 mg (1 tab) daily for 1 week, followed by 10 mg (0.5 tab) daily for 1 week then stop, Disp: 60 tablet, Rfl: 1   spironolactone (ALDACTONE) 25 MG tablet, Take 0.5 tablets (12.5 mg total) by mouth  daily., Disp: 30 tablet, Rfl: 0   torsemide (DEMADEX) 20 MG tablet, Take 1 tablet (20 mg total) by mouth daily., Disp: 30 tablet, Rfl: 0   VENTOLIN HFA 108 (90 BASE) MCG/ACT inhaler, Inhale 2 puffs into the lungs every 6 (six) hours as needed for wheezing or shortness of breath. , Disp: , Rfl:

## 2022-08-26 NOTE — Addendum Note (Signed)
Addended by: Loma Sousa on: 08/26/2022 01:51 PM   Modules accepted: Orders

## 2022-09-08 NOTE — Progress Notes (Signed)
ADVANCED HF CLINIC CONSULT NOTE  Primary Care: Ginger Organ., MD HF Cardiologist: Dr. Aundra Dubin  HPI: 61 y.o. female smoker w/ h/o COPD, HTN, HLD, Type 2DM and chronic pain syndrome/ fibromyalgia and new diagnosis of pulmonary hypertension and RV failure/cor pulmonale.  She was admitted 11/23 with respiratory distress, treated with BiPap. SCr elevated and unable to get CT chest. Echo showed EF 60-65%, severe RV failure and possible McConnell's sign, RA mod dilated, no MR.  Felt respiratory distress related to new dHF. She was diuresed with IV lasix. RUQ [place] limited due to patient habitus but suggestive of possible liver disease/cirrhosis (coarsened hepatic echotexture). She underwent RHC showing low PCWP and elevated right heart pressures, suggesting cor pulmonale. V/Q scan negative for PE. She required significant oxygen requirement and it was felt lung parenchymal disease to be significant. Continued to treat with steroids and oxygen.  GDMT initiated but limited by need for low-dose midodrine. She was discharged home on oxygen, weight 189 lbs.  Today she returns for post hospital HF follow up. Overall feeling fine. She wears 2L oxygen continuously now. She has shortness of breath walking short distances on flat ground. Gets around her house and does ADLs without significant dyspnea. Has occasional dizziness. HR has been as high as 150's at home. Denies palpitations, abnormal bleeding, CP,  edema, or PND/Orthopnea. Appetite ok. No fever or chills. Weight at home 187 pounds. Taking all medications. Remains quit from cigarettes since discharge.  ECG (personally reviewed): ST 136 bpm  Labs (11/23): K 4.0, creatinine 1.08  Cardiac Studies:  - Echo (11/23): EF 60-65%, grade I DD,  RV severely reduced, possible McConnell's sign.  - RHC (11/23): RA mean 11, PA 44/19 mean 28, PCWP mean 4, CO/CI (Fick) 5.46/2.65, PVR 4.4, PAPi 2.3    Review of Systems: [y] = yes, _0  = no   General: Weight  gain _1 ; Weight loss _2 ; Anorexia _3 ; Fatigue _4 ; Fever _5 ; Chills _6 ; Weakness _7   Cardiac: Chest pain/pressure _8 ; Resting SOB _9 ; Exertional SOB Blue.Reese ]; Orthopnea _10 ; Pedal Edema _11 ; Palpitations _12 ; Syncope _13 ; Presyncope _14 ; Paroxysmal nocturnal dyspnea_15   Pulmonary: Cough _16 ; Wheezing_17 ; Hemoptysis_18 ; Sputum _19 ; Snoring _20   GI: Vomiting_21 ; Dysphagia_22 ; Melena_23 ; Hematochezia _24 ; Heartburn_25 ; Abdominal pain _26 ; Constipation _27 ; Diarrhea _28 ; BRBPR _29   GU: Hematuria_30 ; Dysuria _31 ; Nocturia_32   Vascular: Pain in legs with walking _33 ; Pain in feet with lying flat _34 ; Non-healing sores _35 ; Stroke _36 ; TIA _37 ; Slurred speech _38 ;  Neuro: Headaches_39 ; Vertigo_40 ; Seizures_41 ; Paresthesias_42 ;Blurred vision _43 ; Diplopia _44 ; Vision changes _45   Ortho/Skin: Arthritis _46 ; Joint pain _47 ; Muscle pain _48 ; Joint swelling _49 ; Back Pain _50 ; Rash _51   Psych: Depression_52 ; Anxiety_53   Heme: Bleeding problems _54 ; Clotting disorders _55 ; Anemia _56   Endocrine: Diabetes _57 ; Thyroid dysfunction_58   Past Medical History:  Diagnosis Date   Acid reflux disease    Anxiety    Asthma    Bronchitis, chronic (HCC)    Effects worse with URI   Chronic pain syndrome    COPD (chronic obstructive pulmonary disease) ([place])    Depression    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, type 2   Diverticulitis  Dysplasia of cervix (uteri)    patient states she had dysplasia of the uterus stage 4   Endometrial cancer (HCC)    Fibromyalgia    Fibromyalgia    GERD (gastroesophageal reflux disease)    Headache(784.0)    migraine headache   Hyperlipidemia    Hypertension    Kidney stones    at least 6 stones in past   Pneumonia 2019   Restless leg syndrome    Bilateral   Vomiting    last night at 2300 after last round of antibiotic   Current Outpatient Medications  Medication Sig Dispense Refill   acetaminophen (TYLENOL) 500 MG tablet Take 500 mg by  mouth every 6 (six) hours as needed.     apixaban (ELIQUIS) 5 MG TABS tablet Take 1 tablet (5 mg total) by mouth 2 (two) times daily. 60 tablet 0   ARIPiprazole (ABILIFY) 5 MG tablet Take 1 tablet (5 mg total) by mouth daily. 30 tablet 1   aspirin EC 81 MG tablet Take one (1) tablet by mouth daily Oral     atorvastatin (LIPITOR) 40 MG tablet Take 40 mg by mouth at bedtime.      budesonide (PULMICORT) 0.25 MG/2ML nebulizer solution Use 1 vial (0.25 mg total) by nebulization 2 (two) times daily. 120 mL 0   clonazePAM (KLONOPIN) 0.25 MG disintegrating tablet Take 1 tablet (0.25 mg total) by mouth 2 (two) times daily as needed (anxiety). 60 tablet 0   dapagliflozin propanediol (FARXIGA) 10 MG TABS tablet Take 1 tablet (10 mg total) by mouth daily. 30 tablet 0   dicyclomine (BENTYL) 10 MG capsule Take 10 mg by mouth every 8 (eight) hours as needed for spasms (abdominal spasms).      doxepin (SINEQUAN) 100 MG capsule Take 1 capsule (100 mg total) by mouth at bedtime. 30 capsule 1   FLUoxetine (PROZAC) 40 MG capsule TAKE 1 CAPSULE BY MOUTH EVERY DAY 30 capsule 1   ipratropium-albuterol (DUONEB) 0.5-2.5 (3) MG/3ML SOLN Use 1 vial by nebulization every 6 (six) hours as needed. 360 mL 0   omeprazole (PRILOSEC) 40 MG capsule Take 40 mg by mouth daily before breakfast.   6   ONETOUCH VERIO test strip CHECK SUGARS TWICE A DAY DX  DM2, INSULIN DEPENDANT     Potassium Chloride ER 20 MEQ TBCR Take 1 tablet by mouth daily.     predniSONE (DELTASONE) 20 MG tablet Take 40 mg (2 tabs) daily for 4 days, followed by 30 mg (1.5 tabs) daily for 1 week, followed by 20 mg (1 tab) daily for 1 week, followed by 10 mg (0.5 tab) daily for 1 week then stop 60 tablet 1   spironolactone (ALDACTONE) 25 MG tablet Take 0.5 tablets (12.5 mg total) by mouth daily. 30 tablet 0   torsemide (DEMADEX) 20 MG tablet Take 1 tablet (20 mg total) by mouth daily. 30 tablet 0   VENTOLIN HFA 108 (90 BASE) MCG/ACT inhaler Inhale 2 puffs into the  lungs every 6 (six) hours as needed for wheezing or shortness of breath.      No current facility-administered medications for this encounter.   Allergies  Allergen Reactions   Tramadol     Per Dr. Sharyon Medicus is renal failure, patient states that she is currently taking this medication and she has not had a reaction to Tramadol since this time.   Social History   Socioeconomic History   Marital status: Married    Spouse name: Not on file  Number of children: Not on file   Years of education: Not on file   Highest education level: Not on file  Occupational History   Not on file  Tobacco Use   Smoking status: Former    Packs/day: 0.50    Types: Cigarettes    Quit date: 2010    Years since quitting: 13.9   Smokeless tobacco: Never   Tobacco comments:    about one pack a day on a bad day  Vaping Use   Vaping Use: Never used  Substance and Sexual Activity   Alcohol use: No   Drug use: No   Sexual activity: Never    Birth control/protection: None  Other Topics Concern   Not on file  Social History Narrative   Not on file   Social Determinants of Health   Financial Resource Strain: Not on file  Food Insecurity: No Food Insecurity (07/25/2022)   Hunger Vital Sign    Worried About Running Out of Food in the Last Year: Never true    Ran Out of Food in the Last Year: Never true  Transportation Needs: No Transportation Needs (07/25/2022)   PRAPARE - Hydrologist (Medical): No    Lack of Transportation (Non-Medical): No  Physical Activity: Not on file  Stress: Not on file  Social Connections: Not on file  Intimate Partner Violence: Not At Risk (07/25/2022)   Humiliation, Afraid, Rape, and Kick questionnaire    Fear of Current or Ex-Partner: No    Emotionally Abused: No    Physically Abused: No    Sexually Abused: No   Family History  Problem Relation Age of Onset   Diabetes Mother    Throat cancer Mother    Uterine cancer Sister     Deep vein thrombosis Neg Hx    Pulmonary embolism Neg Hx     BP 110/80   Pulse (!) 130   Wt 87.1 kg (192 lb)   LMP 04/20/2013   SpO2 92%   BMI 34.01 kg/m   Wt Readings from Last 3 Encounters:  09/10/22 87.1 kg (192 lb)  08/26/22 84.1 kg (185 lb 6.4 oz)  08/04/22 86.2 kg (190 lb)   PHYSICAL EXAM: General:  NAD. No resp difficulty, chronically-ill appearing, walked into clinic on oxygen HEENT: Normal Neck: Supple. No JVD. Carotids 2+ bilat; no bruits. No lymphadenopathy or thryomegaly appreciated. Cor: PMI nondisplaced. Regular tachy rate & rhythm. No rubs, gallops or murmurs. Lungs: Diminished all lobes. Abdomen: Soft, nontender, nondistended. No hepatosplenomegaly. No bruits or masses. Good bowel sounds. Extremities: No cyanosis, clubbing, rash, edema Neuro: Alert & oriented x 3, cranial nerves grossly intact. Moves all 4 extremities w/o difficulty. Affect pleasant.  ASSESSMENT & PLAN: 1. RV failure/cor pulmonale: Echo 11/23 showed EF 60-65%, D-shaped septum, moderate RV enlargement with severely decreased RV systolic function, IVC dilated. RHC with low PCWP, elevated right-sided filling pressures, mild PAH, preserved cardiac output. Suspect RV failure due to parenchymal lung disease (emphysema, smoking-related ILD noted on HRCT 4/23).  NYHA II-III, suspect lung disease contributes to her symptoms. She does not appear volume overloaded on exam.  - Continue torsemide 20 mg daily.  BMET today. - Continue spironolactone 12.5 daily.  - Continue Farxiga 10 mg daily.   - Off midodrine. BP stable.  2. 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  HRCT 4/23 showed emphysema and smoking-related ILD.  V/Q scan did not show evidence for PE.  - Continue supplemental  O2 (not on oxygen prior to 11/23 admission). - Arrange home sleep study. 3. Chronic hypoxemic respiratory failure: Now on 2 L oxygen. Suspect combination of AECOPD (emphysema, smoking-related  ILD) and RV failure/cor pulmonale.  No evidence for PE by V/Q scan. RHC with low PCWP and elevated right heart pressures, suggesting cor pulmonale.  Given that she still has significant oxygen requirement, it appears that her lung parenchymal disease is quite significant. Elevated ESR and CRP, treating for component of inflammatory lung disease with steroids.  Oxygen requirement down to 2L .  - She has completed her prednisone taper. - Follows with Pulmonary. 4. COPD: Emphysema on chest imaging.  Quit smoking 11/23.  5. Atrial fibrillation: Paroxysmal. She is in sinus today.  - Continue Eliquis.   6. ST: HR 130's in clinic today, sinus. Denies palpitations but says HR up to 150's at home. Avoiding beta blocker with COPD. Could consider bisoprolol. - Place Zio 2 week to quantify. - Consider adding dig for rate control + RV support. - CBC and TSH today. - Arrange home sleep study as above. 7. H/o AKI: Creatinine 2.6 at admission. BMET today. 8. Cirrhosis: Possible cirrhosis by RUQ [place].  This may be due to RV failure.  9. 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   Follow up in 4 weeks with APP and 3 months with Dr. Newman Nip, FNP-BC 09/10/22

## 2022-09-10 ENCOUNTER — Inpatient Hospital Stay (HOSPITAL_COMMUNITY)
Admission: RE | Admit: 2022-09-10 | Discharge: 2022-09-10 | Disposition: A | Discharge status: Home / Self Care | Payer: HMO | Source: Ambulatory Visit | Attending: Cardiology | Admitting: Cardiology

## 2022-09-10 ENCOUNTER — Other Ambulatory Visit (HOSPITAL_COMMUNITY): Payer: Self-pay | Admitting: Cardiology

## 2022-09-10 ENCOUNTER — Ambulatory Visit (HOSPITAL_COMMUNITY)
Admission: RE | Admit: 2022-09-10 | Discharge: 2022-09-10 | Disposition: A | Discharge status: Home / Self Care | Payer: HMO | Source: Ambulatory Visit | Attending: Family Medicine | Admitting: Family Medicine

## 2022-09-10 ENCOUNTER — Encounter (HOSPITAL_COMMUNITY): Payer: Self-pay

## 2022-09-10 VITALS — BP 110/80 | HR 130 | Wt 192.0 lb

## 2022-09-10 DIAGNOSIS — R7982 Elevated C-reactive protein (CRP): Secondary | ICD-10-CM | POA: Diagnosis not present

## 2022-09-10 DIAGNOSIS — Z7982 Long term (current) use of aspirin: Secondary | ICD-10-CM | POA: Diagnosis not present

## 2022-09-10 DIAGNOSIS — R700 Elevated erythrocyte sedimentation rate: Secondary | ICD-10-CM | POA: Diagnosis not present

## 2022-09-10 DIAGNOSIS — I480 Paroxysmal atrial fibrillation: Secondary | ICD-10-CM | POA: Insufficient documentation

## 2022-09-10 DIAGNOSIS — I2781 Cor pulmonale (chronic): Secondary | ICD-10-CM | POA: Diagnosis not present

## 2022-09-10 DIAGNOSIS — Z7984 Long term (current) use of oral hypoglycemic drugs: Secondary | ICD-10-CM | POA: Insufficient documentation

## 2022-09-10 DIAGNOSIS — Z7952 Long term (current) use of systemic steroids: Secondary | ICD-10-CM | POA: Insufficient documentation

## 2022-09-10 DIAGNOSIS — J449 Chronic obstructive pulmonary disease, unspecified: Secondary | ICD-10-CM

## 2022-09-10 DIAGNOSIS — I272 Pulmonary hypertension, unspecified: Secondary | ICD-10-CM | POA: Insufficient documentation

## 2022-09-10 DIAGNOSIS — Z21 Asymptomatic human immunodeficiency virus [HIV] infection status: Secondary | ICD-10-CM | POA: Insufficient documentation

## 2022-09-10 DIAGNOSIS — J4489 Other specified chronic obstructive pulmonary disease: Secondary | ICD-10-CM | POA: Insufficient documentation

## 2022-09-10 DIAGNOSIS — F32A Depression, unspecified: Secondary | ICD-10-CM | POA: Insufficient documentation

## 2022-09-10 DIAGNOSIS — Z7901 Long term (current) use of anticoagulants: Secondary | ICD-10-CM | POA: Insufficient documentation

## 2022-09-10 DIAGNOSIS — R7989 Other specified abnormal findings of blood chemistry: Secondary | ICD-10-CM

## 2022-09-10 DIAGNOSIS — R000 Tachycardia, unspecified: Secondary | ICD-10-CM

## 2022-09-10 DIAGNOSIS — E119 Type 2 diabetes mellitus without complications: Secondary | ICD-10-CM | POA: Insufficient documentation

## 2022-09-10 DIAGNOSIS — E785 Hyperlipidemia, unspecified: Secondary | ICD-10-CM | POA: Diagnosis not present

## 2022-09-10 DIAGNOSIS — J849 Interstitial pulmonary disease, unspecified: Secondary | ICD-10-CM | POA: Diagnosis not present

## 2022-09-10 DIAGNOSIS — G894 Chronic pain syndrome: Secondary | ICD-10-CM | POA: Diagnosis not present

## 2022-09-10 DIAGNOSIS — J9611 Chronic respiratory failure with hypoxia: Secondary | ICD-10-CM | POA: Diagnosis not present

## 2022-09-10 DIAGNOSIS — K7469 Other cirrhosis of liver: Secondary | ICD-10-CM

## 2022-09-10 DIAGNOSIS — K219 Gastro-esophageal reflux disease without esophagitis: Secondary | ICD-10-CM | POA: Insufficient documentation

## 2022-09-10 DIAGNOSIS — R42 Dizziness and giddiness: Secondary | ICD-10-CM | POA: Insufficient documentation

## 2022-09-10 DIAGNOSIS — Z87891 Personal history of nicotine dependence: Secondary | ICD-10-CM | POA: Insufficient documentation

## 2022-09-10 DIAGNOSIS — F419 Anxiety disorder, unspecified: Secondary | ICD-10-CM | POA: Insufficient documentation

## 2022-09-10 DIAGNOSIS — I4891 Unspecified atrial fibrillation: Secondary | ICD-10-CM

## 2022-09-10 DIAGNOSIS — I110 Hypertensive heart disease with heart failure: Secondary | ICD-10-CM | POA: Diagnosis not present

## 2022-09-10 DIAGNOSIS — Z794 Long term (current) use of insulin: Secondary | ICD-10-CM | POA: Insufficient documentation

## 2022-09-10 DIAGNOSIS — Z79899 Other long term (current) drug therapy: Secondary | ICD-10-CM | POA: Diagnosis not present

## 2022-09-10 DIAGNOSIS — I471 Supraventricular tachycardia, unspecified: Secondary | ICD-10-CM

## 2022-09-10 DIAGNOSIS — I50810 Right heart failure, unspecified: Secondary | ICD-10-CM | POA: Insufficient documentation

## 2022-09-10 DIAGNOSIS — Z9981 Dependence on supplemental oxygen: Secondary | ICD-10-CM | POA: Insufficient documentation

## 2022-09-10 DIAGNOSIS — Z7951 Long term (current) use of inhaled steroids: Secondary | ICD-10-CM | POA: Insufficient documentation

## 2022-09-10 LAB — TSH: TSH: 2.08 u[IU]/mL (ref 0.350–4.500)

## 2022-09-10 LAB — CBC
HCT: 36.6 % (ref 36.0–46.0)
Hemoglobin: 11.4 g/dL — ABNORMAL LOW (ref 12.0–15.0)
MCH: 28.1 pg (ref 26.0–34.0)
MCHC: 31.1 g/dL (ref 30.0–36.0)
MCV: 90.1 fL (ref 80.0–100.0)
Platelets: 190 10*3/uL (ref 150–400)
RBC: 4.06 MIL/uL (ref 3.87–5.11)
RDW: 17.2 % — ABNORMAL HIGH (ref 11.5–15.5)
WBC: 9.3 10*3/uL (ref 4.0–10.5)
nRBC: 0 % (ref 0.0–0.2)

## 2022-09-10 LAB — BASIC METABOLIC PANEL
Anion gap: 16 — ABNORMAL HIGH (ref 5–15)
BUN: 24 mg/dL — ABNORMAL HIGH (ref 8–23)
CO2: 27 mmol/L (ref 22–32)
Calcium: 7.6 mg/dL — ABNORMAL LOW (ref 8.9–10.3)
Chloride: 96 mmol/L — ABNORMAL LOW (ref 98–111)
Creatinine, Ser: 0.99 mg/dL (ref 0.44–1.00)
GFR, Estimated: >60 mL/min (ref >60)
Glucose, Bld: 114 mg/dL — ABNORMAL HIGH (ref 70–99)
Potassium: 3.5 mmol/L (ref 3.5–5.1)
Sodium: 139 mmol/L (ref 135–145)

## 2022-09-10 LAB — BRAIN NATRIURETIC PEPTIDE: B Natriuretic Peptide: 39.3 pg/mL (ref 0.0–100.0)

## 2022-09-10 NOTE — Patient Instructions (Addendum)
There has been no changes to your medication.  Labs done today, your results will be available in MyChart, we will contact you for abnormal readings.  Your provider has recommended that  you wear a Zio Patch for 14 days.  This monitor will record your heart rhythm for our review.  IF you have any symptoms while wearing the monitor please press the button.  If you have any issues with the patch or you notice a red or orange light on it please call the company at [phone].  Once you remove the patch please mail it back to the company as soon as possible so we can get the results.  Your provider has recommended that you have a home sleep study.  We have provided you with the equipment in our office today. Please go ahead and download the app. DO NOT OPEN THE BOX UNTIL WE ADVISE YOU TO DO SO, once insurance has approved. Once given permission to proceed with the test, open althe app and follow the instructions. YOUR PIN NUMBER IS: [id number]. Once you have completed the test you just dispose of the equipment, the information is automatically uploaded to [place] via blue-tooth technology. If your test is positive for sleep apnea and you need a home CPAP machine you will be contacted by Dr Theodosia Blender office [facility]) to set this up.   Your physician recommends that you schedule a follow-up appointment in: 4 weeks then 3 months with Dr. Aundra Dubin  If you have any questions or concerns before your next appointment please send [place] a message through [facility] or call our office at [phone].    TO LEAVE A MESSAGE FOR THE NURSE SELECT OPTION 2, PLEASE LEAVE A MESSAGE INCLUDING: YOUR NAME DATE OF BIRTH CALL BACK NUMBER REASON FOR CALL**this is important as we prioritize the call backs  YOU WILL RECEIVE A CALL BACK THE SAME DAY AS LONG AS YOU CALL BEFORE 4:00 PM  At the [facility], you and your health needs are our priority. As part of our continuing mission to provide you with exceptional  heart care, we have created designated Provider Care Teams. These Care Teams include your primary Cardiologist (physician) and Advanced Practice Providers (APPs- Physician Assistants and Nurse Practitioners) who all work together to provide you with the care you need, when you need it.   You may see any of the following providers on your designated Care Team at your next follow up: Dr Glori Bickers Dr Loralie Champagne Dr. Roxana Hires, NP Lyda Jester, [place] [facility] [place], [place] Forestine Na, NP Audry Riles, PharmD   Please be sure to bring in all your medications bottles to every appointment.

## 2022-09-10 NOTE — Progress Notes (Signed)
Height:     Weight: BMI:  Today's Date:  STOP BANG RISK ASSESSMENT S (snore) Have you been told that you snore?     YES   T (tired) Are you often tired, fatigued, or sleepy during the day?   YES  O (obstruction) Do you stop breathing, choke, or gasp during sleep? NO   P (pressure) Do you have or are you being treated for high blood pressure? YES   B (BMI) Is your body index greater than 35 kg/m? NO   A (age) Are you 61 years old or older? YES   N (neck) Do you have a neck circumference greater than 16 inches?   NO   G (gender) Are you a female? NO   TOTAL STOP/BANG "YES" ANSWERS 4                                                                       For Office Use Only              Procedure Order Form    YES to 3+ Stop Bang questions OR two clinical symptoms - patient qualifies for WatchPAT (CPT 95800)      Clinical Notes: Will consult Sleep Specialist and refer for management of therapy due to patient increased risk of Sleep Apnea. Ordering a sleep study due to the following two clinical symptoms: Excessive daytime sleepiness G47.10 / Gastroesophageal reflux K21.9 / Nocturia R35.1 / Morning Headaches G44.221 / Difficulty concentrating R41.840 / Memory problems or poor judgment G31.84 / Personality changes or irritability R45.4 / Loud snoring R06.83 / Depression F32.9 / Unrefreshed by sleep G47.8 / Impotence N52.9 / History of high blood pressure R03.0 / Insomnia G47.00        

## 2022-09-14 ENCOUNTER — Telehealth (HOSPITAL_COMMUNITY): Payer: Self-pay | Admitting: Surgery

## 2022-09-14 NOTE — Telephone Encounter (Signed)
I called patient to inform her that can proceed with ordered home sleep study and insurance precert is not necessary according to clinic CMA.  She tells me that she will need to get a new cord to plug her phone at her bedside.  I asked that she complete the study as soon as possible.

## 2022-09-22 DIAGNOSIS — Z7951 Long term (current) use of inhaled steroids: Secondary | ICD-10-CM | POA: Diagnosis not present

## 2022-09-22 DIAGNOSIS — Z7982 Long term (current) use of aspirin: Secondary | ICD-10-CM | POA: Diagnosis not present

## 2022-09-22 DIAGNOSIS — I2721 Secondary pulmonary arterial hypertension: Secondary | ICD-10-CM | POA: Diagnosis not present

## 2022-09-22 DIAGNOSIS — Z6832 Body mass index (BMI) 32.0-32.9, adult: Secondary | ICD-10-CM | POA: Diagnosis not present

## 2022-09-22 DIAGNOSIS — I480 Paroxysmal atrial fibrillation: Secondary | ICD-10-CM | POA: Diagnosis not present

## 2022-09-22 DIAGNOSIS — Z9181 History of falling: Secondary | ICD-10-CM | POA: Diagnosis not present

## 2022-09-22 DIAGNOSIS — Z7952 Long term (current) use of systemic steroids: Secondary | ICD-10-CM | POA: Diagnosis not present

## 2022-09-22 DIAGNOSIS — N179 Acute kidney failure, unspecified: Secondary | ICD-10-CM | POA: Diagnosis not present

## 2022-09-22 DIAGNOSIS — I5033 Acute on chronic diastolic (congestive) heart failure: Secondary | ICD-10-CM | POA: Diagnosis not present

## 2022-09-22 DIAGNOSIS — J9601 Acute respiratory failure with hypoxia: Secondary | ICD-10-CM | POA: Diagnosis not present

## 2022-09-22 DIAGNOSIS — I088 Other rheumatic multiple valve diseases: Secondary | ICD-10-CM | POA: Diagnosis not present

## 2022-09-22 DIAGNOSIS — I2781 Cor pulmonale (chronic): Secondary | ICD-10-CM | POA: Diagnosis not present

## 2022-09-22 DIAGNOSIS — Z7901 Long term (current) use of anticoagulants: Secondary | ICD-10-CM | POA: Diagnosis not present

## 2022-09-22 DIAGNOSIS — M797 Fibromyalgia: Secondary | ICD-10-CM | POA: Diagnosis not present

## 2022-09-22 DIAGNOSIS — G894 Chronic pain syndrome: Secondary | ICD-10-CM | POA: Diagnosis not present

## 2022-09-22 DIAGNOSIS — E119 Type 2 diabetes mellitus without complications: Secondary | ICD-10-CM | POA: Diagnosis not present

## 2022-09-22 DIAGNOSIS — E785 Hyperlipidemia, unspecified: Secondary | ICD-10-CM | POA: Diagnosis not present

## 2022-09-22 DIAGNOSIS — I110 Hypertensive heart disease with heart failure: Secondary | ICD-10-CM | POA: Diagnosis not present

## 2022-09-22 DIAGNOSIS — Z9981 Dependence on supplemental oxygen: Secondary | ICD-10-CM | POA: Diagnosis not present

## 2022-09-22 DIAGNOSIS — F17200 Nicotine dependence, unspecified, uncomplicated: Secondary | ICD-10-CM | POA: Diagnosis not present

## 2022-09-22 DIAGNOSIS — J441 Chronic obstructive pulmonary disease with (acute) exacerbation: Secondary | ICD-10-CM | POA: Diagnosis not present

## 2022-09-22 DIAGNOSIS — F32A Depression, unspecified: Secondary | ICD-10-CM | POA: Diagnosis not present

## 2022-09-22 DIAGNOSIS — K7460 Unspecified cirrhosis of liver: Secondary | ICD-10-CM | POA: Diagnosis not present

## 2022-09-24 ENCOUNTER — Encounter (HOSPITAL_COMMUNITY): Payer: Self-pay | Admitting: Psychiatry

## 2022-09-24 ENCOUNTER — Telehealth (HOSPITAL_BASED_OUTPATIENT_CLINIC_OR_DEPARTMENT_OTHER): Payer: HMO | Admitting: Psychiatry

## 2022-09-24 DIAGNOSIS — F411 Generalized anxiety disorder: Secondary | ICD-10-CM | POA: Diagnosis not present

## 2022-09-24 DIAGNOSIS — F4310 Post-traumatic stress disorder, unspecified: Secondary | ICD-10-CM

## 2022-09-24 DIAGNOSIS — J449 Chronic obstructive pulmonary disease, unspecified: Secondary | ICD-10-CM | POA: Diagnosis not present

## 2022-09-24 DIAGNOSIS — F331 Major depressive disorder, recurrent, moderate: Secondary | ICD-10-CM | POA: Diagnosis not present

## 2022-09-24 MED ORDER — ARIPIPRAZOLE 5 MG PO TABS: 5.0000 mg | ORAL_TABLET | Freq: Every day | ORAL | 2 refills | Status: DC

## 2022-09-24 MED ORDER — FLUOXETINE HCL 40 MG PO CAPS: ORAL_CAPSULE | ORAL | 2 refills | Status: DC

## 2022-09-24 MED ORDER — DOXEPIN HCL 100 MG PO CAPS: 100.0000 mg | ORAL_CAPSULE | Freq: Every day | ORAL | 2 refills | Status: DC

## 2022-09-24 MED ORDER — CLONAZEPAM 0.25 MG PO TBDP: 0.2500 mg | ORAL_TABLET | Freq: Two times a day (BID) | ORAL | 2 refills | Status: DC | PRN
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
## 2022-10-01 DIAGNOSIS — I471 Supraventricular tachycardia, unspecified: Secondary | ICD-10-CM | POA: Diagnosis not present

## 2022-10-04 NOTE — Progress Notes (Signed)
ADVANCED HF CLINIC CONSULT NOTE  Primary Care: Ginger Organ., MD HF Cardiologist: Dr. Aundra Dubin Pulmonary: Dr Lake Bells   HPI: 62 y.o. female smoker w/ h/o COPD, HTN, HLD, Type 2DM and chronic pain syndrome/ fibromyalgia and new diagnosis of pulmonary hypertension and RV failure/cor pulmonale.  She was admitted 11/23 with respiratory distress, treated with BiPap. SCr elevated and unable to get CT chest. Echo showed EF 60-65%, severe RV failure and possible McConnell's sign, RA mod dilated, no MR.   Felt respiratory distress related to new dHF. She was diuresed with IV lasix. RUQ [place] limited due to patient habitus but suggestive of possible liver disease/cirrhosis (coarsened hepatic echotexture). She underwent RHC showing low PCWP and elevated right heart pressures, suggesting cor pulmonale. V/Q scan negative for PE. She required significant oxygen requirement and it was felt lung parenchymal disease to be significant. Continued to treat with steroids and oxygen.  GDMT initiated but limited by need for low-dose midodrine. She was discharged home on oxygen, weight 189 lbs.  Saw Dr Lake Bells 08/2022. She was set up for CT of chest in March. Prednisone was wenaed off.   Today she returns for HF follow up.Overall feeling fine. Remains SOB with exertion. Denies PND/Orthopnea. Appetite ok. No fever or chills. Weight at home 192-194  pounds. She does not use CPAP says she can't tolerate CPAP mask. Taking all medications  Labs (11/23): K 4.0, creatinine 1.08 Labs (09/10/22): K 3.5 Creatinine 0.99  Cardiac Studies: - Echo (11/23): EF 60-65%, grade I DD,  RV severely reduced, possible McConnell's sign.  - RHC (11/23): RA mean 11, PA 44/19 mean 28, PCWP mean 4, CO/CI (Fick) 5.46/2.65, PVR 4.4, PAPi 2.3    Past Medical History:  Diagnosis Date   Acid reflux disease    Anxiety    Asthma    Bronchitis, chronic (HCC)    Effects worse with URI   Chronic pain syndrome    COPD (chronic obstructive  pulmonary disease) ([place])    Depression    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, type 2   Diverticulitis    Dysplasia of cervix (uteri)    patient states she had dysplasia of the uterus stage 4   Endometrial cancer (HCC)    Fibromyalgia    Fibromyalgia    GERD (gastroesophageal reflux disease)    Headache(784.0)    migraine headache   Hyperlipidemia    Hypertension    Kidney stones    at least 6 stones in past   Pneumonia 2019   Restless leg syndrome    Bilateral   Vomiting    last night at 2300 after last round of antibiotic   Current Outpatient Medications  Medication Sig Dispense Refill   acetaminophen (TYLENOL) 500 MG tablet Take 500 mg by mouth every 6 (six) hours as needed.     apixaban (ELIQUIS) 5 MG TABS tablet Take 1 tablet (5 mg total) by mouth 2 (two) times daily. 60 tablet 0   ARIPiprazole (ABILIFY) 5 MG tablet Take 1 tablet (5 mg total) by mouth daily. 30 tablet 2   aspirin EC 81 MG tablet Take one (1) tablet by mouth daily Oral     atorvastatin (LIPITOR) 40 MG tablet Take 40 mg by mouth at bedtime.      budesonide (PULMICORT) 0.25 MG/2ML nebulizer solution Use 1 vial (0.25 mg total) by nebulization 2 (two) times daily. 120 mL 0   clonazePAM (KLONOPIN) 0.25 MG disintegrating tablet Take 1 tablet (0.25  mg total) by mouth 2 (two) times daily as needed (anxiety). 60 tablet 2   dapagliflozin propanediol (FARXIGA) 10 MG TABS tablet Take 1 tablet (10 mg total) by mouth daily. 30 tablet 0   dicyclomine (BENTYL) 10 MG capsule Take 10 mg by mouth every 8 (eight) hours as needed for spasms (abdominal spasms).      doxepin (SINEQUAN) 100 MG capsule Take 1 capsule (100 mg total) by mouth at bedtime. 30 capsule 2   FLUoxetine (PROZAC) 40 MG capsule TAKE 1 CAPSULE BY MOUTH EVERY DAY 30 capsule 2   ipratropium-albuterol (DUONEB) 0.5-2.5 (3) MG/3ML SOLN Use 1 vial by nebulization every 6 (six) hours as needed. 360 mL 0   omeprazole (PRILOSEC) 40 MG capsule Take  40 mg by mouth daily before breakfast.   6   ONETOUCH VERIO test strip CHECK SUGARS TWICE A DAY DX  DM2, INSULIN DEPENDANT     Potassium Chloride ER 20 MEQ TBCR Take 1 tablet by mouth daily.     predniSONE (DELTASONE) 20 MG tablet Take 40 mg (2 tabs) daily for 4 days, followed by 30 mg (1.5 tabs) daily for 1 week, followed by 20 mg (1 tab) daily for 1 week, followed by 10 mg (0.5 tab) daily for 1 week then stop 60 tablet 1   spironolactone (ALDACTONE) 25 MG tablet Take 0.5 tablets (12.5 mg total) by mouth daily. 30 tablet 0   torsemide (DEMADEX) 20 MG tablet Take 1 tablet (20 mg total) by mouth daily. 30 tablet 0   VENTOLIN HFA 108 (90 BASE) MCG/ACT inhaler Inhale 2 puffs into the lungs every 6 (six) hours as needed for wheezing or shortness of breath.      No current facility-administered medications for this encounter.   Allergies  Allergen Reactions   Tramadol     Per Dr. Sharyon Medicus is renal failure, patient states that she is currently taking this medication and she has not had a reaction to Tramadol since this time.   Social History   Socioeconomic History   Marital status: Married    Spouse name: Not on file   Number of children: Not on file   Years of education: Not on file   Highest education level: Not on file  Occupational History   Not on file  Tobacco Use   Smoking status: Former    Packs/day: 0.50    Types: Cigarettes    Quit date: 2010    Years since quitting: 14.0   Smokeless tobacco: Never   Tobacco comments:    about one pack a day on a bad day  Vaping Use   Vaping Use: Never used  Substance and Sexual Activity   Alcohol use: No   Drug use: No   Sexual activity: Never    Birth control/protection: None  Other Topics Concern   Not on file  Social History Narrative   Not on file   Social Determinants of Health   Financial Resource Strain: Not on file  Food Insecurity: No Food Insecurity (07/25/2022)   Hunger Vital Sign    Worried About Running  Out of Food in the Last Year: Never true    Ran Out of Food in the Last Year: Never true  Transportation Needs: No Transportation Needs (07/25/2022)   PRAPARE - Hydrologist (Medical): No    Lack of Transportation (Non-Medical): No  Physical Activity: Not on file  Stress: Not on file  Social Connections: Not on file  Intimate  Partner Violence: Not At Risk (07/25/2022)   Humiliation, Afraid, Rape, and Kick questionnaire    Fear of Current or Ex-Partner: No    Emotionally Abused: No    Physically Abused: No    Sexually Abused: No   Family History  Problem Relation Age of Onset   Diabetes Mother    Throat cancer Mother    Uterine cancer Sister    Deep vein thrombosis Neg Hx    Pulmonary embolism Neg Hx     BP 120/82   Pulse (!) 125   Wt 88 kg (194 lb)   LMP 04/20/2013   SpO2 99%   BMI 34.37 kg/m   Wt Readings from Last 3 Encounters:  10/06/22 88 kg (194 lb)  09/10/22 87.1 kg (192 lb)  08/26/22 84.1 kg (185 lb 6.4 oz)   PHYSICAL EXAM: General:  Walking in the clinic. No resp difficulty HEENT: normal Neck: supple. no JVD. Carotids 2+ bilat; no bruits. No lymphadenopathy or thryomegaly appreciated. Cor: PMI nondisplaced. Tachy Regular rate & rhythm. No rubs, gallops or murmurs. Lungs: clear on 3 liters [place].  Abdomen: soft, nontender, nondistended. No hepatosplenomegaly. No bruits or masses. Good bowel sounds. Extremities: no cyanosis, clubbing, rash, edema Neuro: alert & orientedx3, cranial nerves grossly intact. moves all 4 extremities w/o difficulty. Affect pleasant  EKG: Sinsu Tach 122 bpm personally checked.  ASSESSMENT & PLAN: 1. RV failure/cor pulmonale: Echo 11/23 showed EF 60-65%, D-shaped septum, moderate RV enlargement with severely decreased RV systolic function, IVC dilated. RHC with low PCWP, elevated right-sided filling pressures, mild PAH, preserved cardiac output. Suspect RV failure due to parenchymal lung disease (emphysema,  smoking-related ILD noted on HRCT 4/23).  - NYHA III. Volume status stable. Continue torsemide 20 mg daily.  - Continue spironolactone 12.5 daily.  - Continue Farxiga 10 mg daily.   - Off midodrine. BP stable.  2. 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  HRCT 4/23 showed emphysema and smoking-related ILD.  V/Q scan did not show evidence for PE.  - Continue supplemental O2 (not on oxygen prior to 11/23 admission).Sats stable.  - Still needs to complete sleep study. 3. Chronic hypoxemic respiratory failure: Now on 2 L oxygen. Suspect combination of AECOPD (emphysema, smoking-related ILD) and RV failure/cor pulmonale.  No evidence for PE by V/Q scan. RHC with low PCWP and elevated right heart pressures, suggesting cor pulmonale.  Given that she still has significant oxygen requirement, it appears that her lung parenchymal disease is quite significant. Elevated ESR and CRP, treating for component of inflammatory lung disease with steroids.  Oxygen requirement- 2-3 [place]. Sats stable. f - She has completed her prednisone taper. - Follows with Pulmonary. 4. COPD: Emphysema on chest imaging.  Quit smoking 11/23.  5. Atrial fibrillation: Paroxysmal.  - In Sinus Tach  - Continue Eliquis.   6. ST:  Avoiding beta blocker with COPD. Could consider bisoprolol. - Consider adding dig for rate control + RV support. - CBC and TSH today. - Arrange home sleep study as above. 7. H/o AKI: Creatinine 2.6 recent admission. Renal function stable.  8. Cirrhosis: Possible cirrhosis by RUQ [place].  This may be due to RV failure.  9. 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  10. Sinus Tach  Add 2.5 mg bisoprolol daily.   Follow up in 2 months with Dr Aundra Dubin. Christina Warmuth NP-C  12:06 PM   10/06/22

## 2022-10-05 DIAGNOSIS — Z6832 Body mass index (BMI) 32.0-32.9, adult: Secondary | ICD-10-CM | POA: Diagnosis not present

## 2022-10-05 DIAGNOSIS — E785 Hyperlipidemia, unspecified: Secondary | ICD-10-CM | POA: Diagnosis not present

## 2022-10-05 DIAGNOSIS — J441 Chronic obstructive pulmonary disease with (acute) exacerbation: Secondary | ICD-10-CM | POA: Diagnosis not present

## 2022-10-05 DIAGNOSIS — N179 Acute kidney failure, unspecified: Secondary | ICD-10-CM | POA: Diagnosis not present

## 2022-10-05 DIAGNOSIS — K7460 Unspecified cirrhosis of liver: Secondary | ICD-10-CM | POA: Diagnosis not present

## 2022-10-05 DIAGNOSIS — Z7952 Long term (current) use of systemic steroids: Secondary | ICD-10-CM | POA: Diagnosis not present

## 2022-10-05 DIAGNOSIS — Z7951 Long term (current) use of inhaled steroids: Secondary | ICD-10-CM | POA: Diagnosis not present

## 2022-10-05 DIAGNOSIS — Z9181 History of falling: Secondary | ICD-10-CM | POA: Diagnosis not present

## 2022-10-05 DIAGNOSIS — I088 Other rheumatic multiple valve diseases: Secondary | ICD-10-CM | POA: Diagnosis not present

## 2022-10-05 DIAGNOSIS — I480 Paroxysmal atrial fibrillation: Secondary | ICD-10-CM | POA: Diagnosis not present

## 2022-10-05 DIAGNOSIS — Z9981 Dependence on supplemental oxygen: Secondary | ICD-10-CM | POA: Diagnosis not present

## 2022-10-05 DIAGNOSIS — J9601 Acute respiratory failure with hypoxia: Secondary | ICD-10-CM | POA: Diagnosis not present

## 2022-10-05 DIAGNOSIS — Z7982 Long term (current) use of aspirin: Secondary | ICD-10-CM | POA: Diagnosis not present

## 2022-10-05 DIAGNOSIS — M797 Fibromyalgia: Secondary | ICD-10-CM | POA: Diagnosis not present

## 2022-10-05 DIAGNOSIS — I5033 Acute on chronic diastolic (congestive) heart failure: Secondary | ICD-10-CM | POA: Diagnosis not present

## 2022-10-05 DIAGNOSIS — G894 Chronic pain syndrome: Secondary | ICD-10-CM | POA: Diagnosis not present

## 2022-10-05 DIAGNOSIS — I2721 Secondary pulmonary arterial hypertension: Secondary | ICD-10-CM | POA: Diagnosis not present

## 2022-10-05 DIAGNOSIS — I110 Hypertensive heart disease with heart failure: Secondary | ICD-10-CM | POA: Diagnosis not present

## 2022-10-05 DIAGNOSIS — Z7901 Long term (current) use of anticoagulants: Secondary | ICD-10-CM | POA: Diagnosis not present

## 2022-10-05 DIAGNOSIS — F32A Depression, unspecified: Secondary | ICD-10-CM | POA: Diagnosis not present

## 2022-10-05 DIAGNOSIS — I2781 Cor pulmonale (chronic): Secondary | ICD-10-CM | POA: Diagnosis not present

## 2022-10-05 DIAGNOSIS — F17200 Nicotine dependence, unspecified, uncomplicated: Secondary | ICD-10-CM | POA: Diagnosis not present

## 2022-10-05 DIAGNOSIS — E119 Type 2 diabetes mellitus without complications: Secondary | ICD-10-CM | POA: Diagnosis not present

## 2022-10-05 NOTE — Addendum Note (Signed)
Encounter addended by: Micki Riley, RN on: 10/05/2022 3:33 PM  Actions taken: Imaging Exam ended

## 2022-10-06 ENCOUNTER — Ambulatory Visit (HOSPITAL_COMMUNITY)
Admission: RE | Admit: 2022-10-06 | Discharge: 2022-10-06 | Disposition: A | Discharge status: Home / Self Care | Payer: HMO | Source: Ambulatory Visit | Attending: Adult Health | Admitting: Adult Health

## 2022-10-06 VITALS — BP 120/82 | HR 125 | Wt 194.0 lb

## 2022-10-06 DIAGNOSIS — J4489 Other specified chronic obstructive pulmonary disease: Secondary | ICD-10-CM | POA: Diagnosis not present

## 2022-10-06 DIAGNOSIS — G894 Chronic pain syndrome: Secondary | ICD-10-CM | POA: Insufficient documentation

## 2022-10-06 DIAGNOSIS — J9611 Chronic respiratory failure with hypoxia: Secondary | ICD-10-CM | POA: Diagnosis not present

## 2022-10-06 DIAGNOSIS — I480 Paroxysmal atrial fibrillation: Secondary | ICD-10-CM | POA: Insufficient documentation

## 2022-10-06 DIAGNOSIS — E785 Hyperlipidemia, unspecified: Secondary | ICD-10-CM | POA: Diagnosis not present

## 2022-10-06 DIAGNOSIS — I5021 Acute systolic (congestive) heart failure: Secondary | ICD-10-CM

## 2022-10-06 DIAGNOSIS — R700 Elevated erythrocyte sedimentation rate: Secondary | ICD-10-CM | POA: Diagnosis not present

## 2022-10-06 DIAGNOSIS — R7982 Elevated C-reactive protein (CRP): Secondary | ICD-10-CM | POA: Insufficient documentation

## 2022-10-06 DIAGNOSIS — Z7952 Long term (current) use of systemic steroids: Secondary | ICD-10-CM | POA: Diagnosis not present

## 2022-10-06 DIAGNOSIS — M797 Fibromyalgia: Secondary | ICD-10-CM | POA: Insufficient documentation

## 2022-10-06 DIAGNOSIS — Z87891 Personal history of nicotine dependence: Secondary | ICD-10-CM | POA: Diagnosis not present

## 2022-10-06 DIAGNOSIS — I50810 Right heart failure, unspecified: Secondary | ICD-10-CM | POA: Diagnosis not present

## 2022-10-06 DIAGNOSIS — Z7901 Long term (current) use of anticoagulants: Secondary | ICD-10-CM | POA: Insufficient documentation

## 2022-10-06 DIAGNOSIS — J849 Interstitial pulmonary disease, unspecified: Secondary | ICD-10-CM | POA: Insufficient documentation

## 2022-10-06 DIAGNOSIS — Z79899 Other long term (current) drug therapy: Secondary | ICD-10-CM | POA: Diagnosis not present

## 2022-10-06 DIAGNOSIS — I272 Pulmonary hypertension, unspecified: Secondary | ICD-10-CM | POA: Insufficient documentation

## 2022-10-06 DIAGNOSIS — I110 Hypertensive heart disease with heart failure: Secondary | ICD-10-CM | POA: Insufficient documentation

## 2022-10-06 DIAGNOSIS — I2781 Cor pulmonale (chronic): Secondary | ICD-10-CM | POA: Insufficient documentation

## 2022-10-06 DIAGNOSIS — R000 Tachycardia, unspecified: Secondary | ICD-10-CM

## 2022-10-06 MED ORDER — BISOPROLOL FUMARATE 5 MG PO TABS: 2.5000 mg | ORAL_TABLET | Freq: Every day | ORAL | 8 refills | Status: DC

## 2022-10-06 NOTE — Patient Instructions (Signed)
Thank you for coming in today  START Bisoprolol 2.5 mg daily   Your physician recommends that you schedule a follow-up appointment in:  Please keep follow up with Dr. Aundra Dubin in March    Do the following things EVERYDAY: Weigh yourself in the morning before breakfast. Write it down and keep it in a log. Take your medicines as prescribed Eat low salt foods--Limit salt (sodium) to 2000 mg per day.  Stay as active as you can everyday Limit all fluids for the day to less than 2 liters  At the [facility], you and your health needs are our priority. As part of our continuing mission to provide you with exceptional heart care, we have created designated Provider Care Teams. These Care Teams include your primary Cardiologist (physician) and Advanced Practice Providers (APPs- Physician Assistants and Nurse Practitioners) who all work together to provide you with the care you need, when you need it.   You may see any of the following providers on your designated Care Team at your next follow up: Dr Glori Bickers Dr Loralie Champagne Dr. Roxana Hires, NP Lyda Jester, [place] [facility], [place] Forestine Na, NP Audry Riles, PharmD   Please be sure to bring in all your medications bottles to every appointment.   If you have any questions or concerns before your next appointment please send [place] a message through [place] or call our office at [phone].    TO LEAVE A MESSAGE FOR THE NURSE SELECT OPTION 2, PLEASE LEAVE A MESSAGE INCLUDING: YOUR NAME DATE OF BIRTH CALL BACK NUMBER REASON FOR CALL**this is important as we prioritize the call backs  YOU WILL RECEIVE A CALL BACK THE SAME DAY AS LONG AS YOU CALL BEFORE 4:00 PM

## 2022-10-11 DIAGNOSIS — E119 Type 2 diabetes mellitus without complications: Secondary | ICD-10-CM | POA: Diagnosis not present

## 2022-10-11 DIAGNOSIS — Z7901 Long term (current) use of anticoagulants: Secondary | ICD-10-CM | POA: Diagnosis not present

## 2022-10-11 DIAGNOSIS — Z87891 Personal history of nicotine dependence: Secondary | ICD-10-CM | POA: Diagnosis not present

## 2022-10-11 DIAGNOSIS — Z7984 Long term (current) use of oral hypoglycemic drugs: Secondary | ICD-10-CM | POA: Diagnosis not present

## 2022-10-11 DIAGNOSIS — I5021 Acute systolic (congestive) heart failure: Secondary | ICD-10-CM | POA: Diagnosis not present

## 2022-10-11 DIAGNOSIS — J449 Chronic obstructive pulmonary disease, unspecified: Secondary | ICD-10-CM | POA: Diagnosis not present

## 2022-10-11 DIAGNOSIS — Z6833 Body mass index (BMI) 33.0-33.9, adult: Secondary | ICD-10-CM | POA: Diagnosis not present

## 2022-10-11 DIAGNOSIS — I509 Heart failure, unspecified: Secondary | ICD-10-CM | POA: Diagnosis not present

## 2022-10-11 DIAGNOSIS — I480 Paroxysmal atrial fibrillation: Secondary | ICD-10-CM | POA: Diagnosis not present

## 2022-10-22 ENCOUNTER — Ambulatory Visit
Admission: RE | Admit: 2022-10-22 | Discharge: 2022-10-22 | Disposition: A | Discharge status: Home / Self Care | Payer: PPO | Source: Ambulatory Visit | Attending: Pulmonary Disease | Admitting: Pulmonary Disease

## 2022-10-22 DIAGNOSIS — J849 Interstitial pulmonary disease, unspecified: Secondary | ICD-10-CM

## 2022-10-22 DIAGNOSIS — I700 Atherosclerosis of aorta: Secondary | ICD-10-CM | POA: Diagnosis not present

## 2022-10-22 DIAGNOSIS — R911 Solitary pulmonary nodule: Secondary | ICD-10-CM | POA: Diagnosis not present

## 2022-10-22 DIAGNOSIS — R918 Other nonspecific abnormal finding of lung field: Secondary | ICD-10-CM | POA: Diagnosis not present

## 2022-10-22 DIAGNOSIS — J439 Emphysema, unspecified: Secondary | ICD-10-CM | POA: Diagnosis not present

## 2022-10-25 DIAGNOSIS — J449 Chronic obstructive pulmonary disease, unspecified: Secondary | ICD-10-CM | POA: Diagnosis not present

## 2022-10-29 ENCOUNTER — Other Ambulatory Visit (HOSPITAL_COMMUNITY): Payer: Self-pay | Admitting: Psychiatry

## 2022-10-29 DIAGNOSIS — F4310 Post-traumatic stress disorder, unspecified: Secondary | ICD-10-CM

## 2022-11-02 ENCOUNTER — Telehealth (HOSPITAL_COMMUNITY): Payer: Self-pay | Admitting: Surgery

## 2022-11-02 NOTE — Telephone Encounter (Signed)
Patient called again to remind her of ordered home sleep study.  She tells me that she hopes to complete this week.

## 2022-11-06 DIAGNOSIS — I5021 Acute systolic (congestive) heart failure: Secondary | ICD-10-CM | POA: Diagnosis not present

## 2022-11-06 DIAGNOSIS — I480 Paroxysmal atrial fibrillation: Secondary | ICD-10-CM | POA: Diagnosis not present

## 2022-11-08 DIAGNOSIS — G4733 Obstructive sleep apnea (adult) (pediatric): Secondary | ICD-10-CM | POA: Diagnosis not present

## 2022-11-10 ENCOUNTER — Ambulatory Visit (INDEPENDENT_AMBULATORY_CARE_PROVIDER_SITE_OTHER): Payer: PPO | Admitting: Pulmonary Disease

## 2022-11-10 ENCOUNTER — Encounter: Payer: Self-pay | Admitting: Pulmonary Disease

## 2022-11-10 VITALS — BP 102/60 | HR 108 | Temp 97.6°F | Ht 66.0 in | Wt 194.8 lb

## 2022-11-10 DIAGNOSIS — J84115 Respiratory bronchiolitis interstitial lung disease: Secondary | ICD-10-CM | POA: Diagnosis not present

## 2022-11-10 DIAGNOSIS — J849 Interstitial pulmonary disease, unspecified: Secondary | ICD-10-CM

## 2022-11-10 DIAGNOSIS — J9611 Chronic respiratory failure with hypoxia: Secondary | ICD-10-CM

## 2022-11-10 DIAGNOSIS — R590 Localized enlarged lymph nodes: Secondary | ICD-10-CM

## 2022-11-10 NOTE — Progress Notes (Signed)
Synopsis: Seen by [place] pulmonary during a hospitalization in November 2023 for acute hypoxemic respiratory failure.  During that time she was noted to have symmetric bilateral pulmonary opacities, right heart catheterization showed pulmonary hypertension with preserved wedge pressure, echocardiogram showed a normal LVEF but increased RV size and severely reduced function with increased PA pressure.  She was felt to have smoking-related inflammatory lung disease and was treated with steroids and oxygen. After her hospitalization she quit smoking.  Prior to that she had been smoking 2 packs of cigarettes daily  Subjective:   PATIENT ID: [name] GENDER: female DOB: Jan 04, 1961, MRN: [id number]   HPI  Chief Complaint  Patient presents with   Follow-up    F/u ct 10/22/22    [name] has been doing great since last visit.  She is not smoking cigarettes.  Her breathing has improved.  She says that she feels somewhat limited her ability to exercise due to using oxygen continuously.  No coughing, no recent bronchitis, no recent pneumonia.  No problems with leg swelling.  She is not able to make it to a scheduled or regular exercise program because of transportation issues right now.  In general, she has done quite well.  Record review: Advanced heart failure clinic records reviewed where she was seen in the context of pulmonary hypertension WHO group 3.  Was stable during that visit.  Past Medical History:  Diagnosis Date   Acid reflux disease    Anxiety    Asthma    Bronchitis, chronic (HCC)    Effects worse with URI   Chronic pain syndrome    COPD (chronic obstructive pulmonary disease) (HCC)    Depression    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, type 2   Diverticulitis    Dysplasia of cervix (uteri)    patient states she had dysplasia of the uterus stage 4   Endometrial cancer (HCC)    Fibromyalgia    Fibromyalgia    GERD (gastroesophageal reflux  disease)    Headache(784.0)    migraine headache   Hyperlipidemia    Hypertension    Kidney stones    at least 6 stones in past   Pneumonia 2019   Restless leg syndrome    Bilateral   Vomiting    last night at 2300 after last round of antibiotic       Review of Systems  Constitutional:  Negative for chills, fever, malaise/fatigue and weight loss.  HENT:  Negative for congestion, sinus pain and sore throat.   Respiratory:  Positive for shortness of breath. Negative for cough and sputum production.   Cardiovascular:  Negative for chest pain and leg swelling.      Objective:  Physical Exam   Vitals:   11/10/22 1613  BP: 102/60  Pulse: (!) 108  Temp: 97.6 F (36.4 C)  TempSrc: Oral  SpO2: 99%  Weight: 194 lb 12.8 oz (88.4 kg)  Height: 5' 6"$  (1.676 m)   2L [place]  Repeat O2 saturation measurement on room air after 7 or 8 minutes: 96 to 97% at rest  Gen: well appearing HENT: OP clear, neck supple PULM: CTA B, normal effort  CV: RRR, no mgr GI: BS+, soft, nontender Derm: no cyanosis or rash Psyche: normal mood and affect   CBC    Component Value Date/Time   WBC 9.3 09/10/2022 1113   RBC 4.06 09/10/2022 1113   HGB 11.4 (L) 09/10/2022 1113   HGB 11.6 (L) 11/22/2019  1350   HCT 36.6 09/10/2022 1113   PLT 190 09/10/2022 1113   PLT 269 11/22/2019 1350   MCV 90.1 09/10/2022 1113   MCH 28.1 09/10/2022 1113   MCHC 31.1 09/10/2022 1113   RDW 17.2 (H) 09/10/2022 1113   LYMPHSABS 1.1 07/23/2022 0952   MONOABS 1.0 07/23/2022 0952   EOSABS 0.0 07/23/2022 0952   BASOSABS 0.0 07/23/2022 0952     Chest imaging: April 2023 high-resolution CT chest images independently reviewed showing upper lobe predominant centrilobular emphysema, subpleural reticular change bilaterally, overall felt not to be consistent with UIP, favored smoking-related lung disease. November 2023 high-resolution CT chest images independently reviewed showing centrilobular emphysema, upper lobe  predominant diffuse groundglass opacification, no honeycombing or significant interlobular septal thickening.  In the bases of the lungs the groundglass is more patchy. Increased adenopathy noted February 2024 CT chest images independently reviewed showing upper lobe predominant paraseptal and centrilobular emphysema with some fibrotic change, throughout the lungs there is mosaicism, interlobular septal thickening, some interstitial change with the peripheral distribution but no clear craniocaudal gradient, no honeycombing, patchy groundglass, resolution of diffuse groundglass as seen previously, some reticular opacity increases, interval decrease size of mediastinal lymph nodes compared to prior, stable solid pulmonary nodules right upper lobe, recommend attention on annual lung cancer screening CT, dilated main pulmonary artery  PFT: November 2023 spirometry ratio 83%, FVC 1.79 L 55% predicted  Labs: July 26, 2022 ANA negative, double-stranded DNA negative, RF slightly elevated, anti-SCL 70 negative,  July 23, 2022 eosinophil count 0  Path:  Echo: November 2023 LVEF 60 to 65%, diminished RV function.  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  Her lung disease has improved significantly since she stopped smoking.  She still has some residual interstitial scarring in the bases of her lungs which will need to monitor but in general things have done much better since she stopped smoking.  Adenopathy has resolved.  Plan: RB ILD: Congratulations on quitting  smoking Will repeat a high-resolution CT scan of the chest in a year to monitor disease activity Repeat lung function testing in a year  Former cigarette smoker: After next your CT scan we will get you enrolled in a lung cancer screening program  Chronic respiratory failure with hypoxemia: Overall improved You do not need to use oxygen when you are at rest, sitting still You still need to use 2 L of oxygen on exertion  Pulmonary hypertension, WHO group 3: Continue treatment as detailed above and by heart failure clinic direction  We will see you back in 1 year, sooner if needed  Immunizations: Immunization History  Administered Date(s) Administered   Influenza,inj,Quad PF,6+ Mos 06/08/2017   Pneumococcal Polysaccharide-23 07/22/2011   Tdap 07/01/2015     Current Outpatient Medications:    acetaminophen (TYLENOL) 500 MG tablet, Take 500 mg by mouth every 6 (six) hours as needed., Disp: , Rfl:    apixaban (ELIQUIS) 5 MG TABS tablet, Take 1 tablet (5 mg total) by mouth 2 (two) times daily., Disp: 60 tablet, Rfl: 0   ARIPiprazole (ABILIFY) 5 MG tablet, Take 1 tablet (5  mg total) by mouth daily., Disp: 30 tablet, Rfl: 2   aspirin EC 81 MG tablet, Take one (1) tablet by mouth daily Oral, Disp: , Rfl:    atorvastatin (LIPITOR) 40 MG tablet, Take 40 mg by mouth at bedtime. , Disp: , Rfl:    bisoprolol (ZEBETA) 5 MG tablet, Take 0.5 tablets (2.5 mg total) by mouth daily., Disp: 15 tablet, Rfl: 8   budesonide (PULMICORT) 0.25 MG/2ML nebulizer solution, Use 1 vial (0.25 mg total) by nebulization 2 (two) times daily., Disp: 120 mL, Rfl: 0   clonazePAM (KLONOPIN) 0.25 MG disintegrating tablet, Take 1 tablet (0.25 mg total) by mouth 2 (two) times daily as needed (anxiety)., Disp: 60 tablet, Rfl: 2   dapagliflozin propanediol (FARXIGA) 10 MG TABS tablet, Take 1 tablet (10 mg total) by mouth daily., Disp: 30 tablet, Rfl: 0   dicyclomine (BENTYL) 10 MG capsule, Take 10 mg by mouth every 8 (eight)  hours as needed for spasms (abdominal spasms). , Disp: , Rfl:    doxepin (SINEQUAN) 100 MG capsule, Take 1 capsule (100 mg total) by mouth at bedtime., Disp: 30 capsule, Rfl: 2   FLUoxetine (PROZAC) 40 MG capsule, TAKE 1 CAPSULE BY MOUTH EVERY DAY, Disp: 30 capsule, Rfl: 2   ipratropium-albuterol (DUONEB) 0.5-2.5 (3) MG/3ML SOLN, Use 1 vial by nebulization every 6 (six) hours as needed., Disp: 360 mL, Rfl: 0   omeprazole (PRILOSEC) 40 MG capsule, Take 40 mg by mouth daily before breakfast. , Disp: , Rfl: 6   ONETOUCH VERIO test strip, CHECK SUGARS TWICE A DAY DX  DM2, INSULIN DEPENDANT, Disp: , Rfl:    Potassium Chloride ER 20 MEQ TBCR, Take 1 tablet by mouth daily., Disp: , Rfl:    spironolactone (ALDACTONE) 25 MG tablet, Take 0.5 tablets (12.5 mg total) by mouth daily., Disp: 30 tablet, Rfl: 0   torsemide (DEMADEX) 20 MG tablet, Take 1 tablet (20 mg total) by mouth daily., Disp: 30 tablet, Rfl: 0   VENTOLIN HFA 108 (90 BASE) MCG/ACT inhaler, Inhale 2 puffs into the lungs every 6 (six) hours as needed for wheezing or shortness of breath. , Disp: , Rfl:    predniSONE (DELTASONE) 20 MG tablet, Take 40 mg (2 tabs) daily for 4 days, followed by 30 mg (1.5 tabs) daily for 1 week, followed by 20 mg (1 tab) daily for 1 week, followed by 10 mg (0.5 tab) daily for 1 week then stop (Patient not taking: Reported on 11/10/2022), Disp: 60 tablet, Rfl: 1

## 2022-11-10 NOTE — Patient Instructions (Signed)
RB ILD: Congratulations on quitting smoking Will repeat a high-resolution CT scan of the chest in a year to monitor disease activity Repeat lung function testing in a year  Former cigarette smoker: After next your CT scan we will get you enrolled in a lung cancer screening program  Chronic respiratory failure with hypoxemia: Overall improved You do not need to use oxygen when you are at rest, sitting still You still need to use 2 L of oxygen on exertion  Pulmonary hypertension, WHO group 3: Continue treatment as detailed above and by heart failure clinic direction  We will see you back in 1 year, sooner if needed

## 2022-11-22 ENCOUNTER — Telehealth (HOSPITAL_COMMUNITY): Payer: Self-pay

## 2022-11-22 ENCOUNTER — Ambulatory Visit (HOSPITAL_COMMUNITY)
Admission: RE | Admit: 2022-11-22 | Discharge: 2022-11-22 | Disposition: A | Discharge status: Home / Self Care | Payer: PPO | Source: Ambulatory Visit | Attending: Cardiology | Admitting: Cardiology

## 2022-11-22 ENCOUNTER — Other Ambulatory Visit (HOSPITAL_COMMUNITY): Payer: Self-pay

## 2022-11-22 ENCOUNTER — Telehealth (HOSPITAL_COMMUNITY): Payer: Self-pay | Admitting: Pharmacist

## 2022-11-22 VITALS — BP 112/68 | HR 105 | Ht 65.0 in | Wt 187.2 lb

## 2022-11-22 DIAGNOSIS — J439 Emphysema, unspecified: Secondary | ICD-10-CM | POA: Diagnosis not present

## 2022-11-22 DIAGNOSIS — I471 Supraventricular tachycardia, unspecified: Secondary | ICD-10-CM | POA: Diagnosis not present

## 2022-11-22 DIAGNOSIS — J849 Interstitial pulmonary disease, unspecified: Secondary | ICD-10-CM | POA: Insufficient documentation

## 2022-11-22 DIAGNOSIS — G894 Chronic pain syndrome: Secondary | ICD-10-CM | POA: Insufficient documentation

## 2022-11-22 DIAGNOSIS — I110 Hypertensive heart disease with heart failure: Secondary | ICD-10-CM | POA: Insufficient documentation

## 2022-11-22 DIAGNOSIS — Z7901 Long term (current) use of anticoagulants: Secondary | ICD-10-CM | POA: Insufficient documentation

## 2022-11-22 DIAGNOSIS — I5021 Acute systolic (congestive) heart failure: Secondary | ICD-10-CM | POA: Diagnosis not present

## 2022-11-22 DIAGNOSIS — Z87891 Personal history of nicotine dependence: Secondary | ICD-10-CM | POA: Insufficient documentation

## 2022-11-22 DIAGNOSIS — I480 Paroxysmal atrial fibrillation: Secondary | ICD-10-CM | POA: Diagnosis not present

## 2022-11-22 DIAGNOSIS — J9611 Chronic respiratory failure with hypoxia: Secondary | ICD-10-CM | POA: Diagnosis not present

## 2022-11-22 DIAGNOSIS — E119 Type 2 diabetes mellitus without complications: Secondary | ICD-10-CM | POA: Insufficient documentation

## 2022-11-22 DIAGNOSIS — Z7984 Long term (current) use of oral hypoglycemic drugs: Secondary | ICD-10-CM | POA: Insufficient documentation

## 2022-11-22 DIAGNOSIS — I4891 Unspecified atrial fibrillation: Secondary | ICD-10-CM

## 2022-11-22 DIAGNOSIS — M797 Fibromyalgia: Secondary | ICD-10-CM | POA: Diagnosis not present

## 2022-11-22 DIAGNOSIS — I2729 Other secondary pulmonary hypertension: Secondary | ICD-10-CM | POA: Diagnosis not present

## 2022-11-22 DIAGNOSIS — Z79899 Other long term (current) drug therapy: Secondary | ICD-10-CM | POA: Diagnosis not present

## 2022-11-22 DIAGNOSIS — E785 Hyperlipidemia, unspecified: Secondary | ICD-10-CM | POA: Insufficient documentation

## 2022-11-22 LAB — BASIC METABOLIC PANEL
Anion gap: 14 (ref 5–15)
BUN: 36 mg/dL — ABNORMAL HIGH (ref 8–23)
CO2: 24 mmol/L (ref 22–32)
Calcium: 9.4 mg/dL (ref 8.9–10.3)
Chloride: 83 mmol/L — ABNORMAL LOW (ref 98–111)
Creatinine, Ser: 1.46 mg/dL — ABNORMAL HIGH (ref 0.44–1.00)
GFR, Estimated: 40 mL/min — ABNORMAL LOW (ref >60)
Glucose, Bld: 873 mg/dL (ref 70–99)
Potassium: 4 mmol/L (ref 3.5–5.1)
Sodium: 121 mmol/L — ABNORMAL LOW (ref 135–145)

## 2022-11-22 LAB — BRAIN NATRIURETIC PEPTIDE: B Natriuretic Peptide: 17.3 pg/mL (ref 0.0–100.0)

## 2022-11-22 MED ORDER — BISOPROLOL FUMARATE 5 MG PO TABS: 5.0000 mg | ORAL_TABLET | Freq: Every day | ORAL | 3 refills | Status: DC

## 2022-11-22 NOTE — Telephone Encounter (Signed)
[facility] lab called with critical value of Glucose - 873. [facility] and Dr. Aundra Dubin were informed and instructed for her to go to ER immediately. Left message for patient to call us  back.

## 2022-11-22 NOTE — Progress Notes (Signed)
Six minute walk patient complete 243.8 meters,  Sats were 98 at start of walk and 94 and end of walk. HR went between 90 and 115 BPM.

## 2022-11-22 NOTE — Telephone Encounter (Signed)
Have tried multiple times to reach patient and no answer. Attempted to reach husband and mother as well. Unable to reach anyone.

## 2022-11-22 NOTE — Progress Notes (Signed)
Primary Care: Ginger Organ., MD HF Cardiologist: Dr. Aundra Dubin Pulmonary: Dr Lake Bells   HPI: 62 y.o. female smoker w/ h/o COPD, HTN, HLD, Type 2 DM, chronic pain syndrome/ fibromyalgia and pulmonary hypertension with RV failure/cor pulmonale.  She was admitted 11/23 with respiratory distress, treated with BiPAP. SCr elevated and unable to get CT chest. Echo showed EF 60-65%, severe RV failure and possible McConnell's sign, RA mod dilated, no MR. Felt respiratory distress related to CHF/RV failure. She was diuresed with IV lasix. RUQ [place] limited due to patient habitus but suggestive of possible liver disease/cirrhosis (coarsened hepatic echotexture). She underwent RHC showing low PCWP and elevated right heart pressures, suggesting cor pulmonale. V/Q scan negative for PE. She had a significant oxygen requirement and lung parenchymal disease felt to be significant. Continued to treat with steroids and oxygen.  GDMT initiated but limited by need for low-dose midodrine. She was discharged home on oxygen, weight 189 lbs.  Zio monitor in 1/24 showed multiple SVT episodes, possible atrial tachycardia, no atrial fibrillation.  Bisoprolol was started.   CT chest in 2/24 showed possible smoking-related ILD (respiratory bronchiolitis).   Today she returns for HF follow up.  Not using CPAP (cannot tolerate mask).  Only uses oxygen with exertion, not at rest or at night. No dyspnea walking on flat ground.  Mild dyspnea with stairs/inclines.  No chest pain.  No orthopnea/PND.  No lightheadedness or palpitations. She quit smoking in 11/23.  Labs (11/23): K 4.0, creatinine 1.08 Labs (09/10/22): K 3.5 Creatinine 0.99  6 minute walk (3/24): 244 m  ECG (personally reviewed): sinus tachy rate 102  PMH: 1. COPD: Prior smoking.  2. 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   - CT chest (2/24) with suspected respiratory bronchiolitis-related ILD.  3. OSA: Cannot tolerate CPAP.  4. Atrial  fibrillation: Paroxysmal.  5. Atrial tachycardia: Zio monitor in 1/24 showed multiple SVT episodes, possible atrial tachycardia, no atrial fibrillation.  6. RV failiure/cor pulmonale:  - Echo (11/23): EF 60-65%, grade I DD,  RV severely reduced systolic function.  - RHC (11/23): RA mean 11, PA 44/19 mean 28, PCWP mean 4, CO/CI (Fick) 5.46/2.65, PVR 4.4, PAPi 2.3  - V/Q scan (11/23): No evidence for chronic PE.  7. Cirrhosis: Possibly due to RV failure.  8. Type 2 diabetes 9. HTN 10. Hyperlipidemia 11. Fibromyalgia 12. H/o endometrial cancer  13. H/o nephrolithiasis  Current Outpatient Medications  Medication Sig Dispense Refill   acetaminophen (TYLENOL) 500 MG tablet Take 500 mg by mouth every 6 (six) hours as needed.     apixaban (ELIQUIS) 5 MG TABS tablet Take 1 tablet (5 mg total) by mouth 2 (two) times daily. 60 tablet 0   ARIPiprazole (ABILIFY) 5 MG tablet Take 1 tablet (5 mg total) by mouth daily. 30 tablet 2   atorvastatin (LIPITOR) 40 MG tablet Take 40 mg by mouth at bedtime.      budesonide (PULMICORT) 0.25 MG/2ML nebulizer solution Use 1 vial (0.25 mg total) by nebulization 2 (two) times daily. 120 mL 0   clonazePAM (KLONOPIN) 0.25 MG disintegrating tablet Take 1 tablet (0.25 mg total) by mouth 2 (two) times daily as needed (anxiety). 60 tablet 2   dapagliflozin propanediol (FARXIGA) 10 MG TABS tablet Take 1 tablet (10 mg total) by mouth daily. 30 tablet 0   doxepin (SINEQUAN) 100 MG capsule Take 1 capsule (100 mg total) by mouth at bedtime. 30 capsule 2   FLUoxetine (PROZAC) 40 MG capsule TAKE  1 CAPSULE BY MOUTH EVERY DAY 30 capsule 2   ipratropium-albuterol (DUONEB) 0.5-2.5 (3) MG/3ML SOLN Use 1 vial by nebulization every 6 (six) hours as needed. 360 mL 0   omeprazole (PRILOSEC) 40 MG capsule Take 40 mg by mouth daily before breakfast.   6   ONETOUCH VERIO test strip CHECK SUGARS TWICE A DAY DX  DM2, INSULIN DEPENDANT     Potassium Chloride ER 20 MEQ TBCR Take 1 tablet by  mouth daily.     spironolactone (ALDACTONE) 25 MG tablet Take 0.5 tablets (12.5 mg total) by mouth daily. 30 tablet 0   torsemide (DEMADEX) 20 MG tablet Take 1 tablet (20 mg total) by mouth daily. 30 tablet 0   VENTOLIN HFA 108 (90 BASE) MCG/ACT inhaler Inhale 2 puffs into the lungs every 6 (six) hours as needed for wheezing or shortness of breath.      bisoprolol (ZEBETA) 5 MG tablet Take 1 tablet (5 mg total) by mouth daily. 90 tablet 3   dicyclomine (BENTYL) 10 MG capsule Take 10 mg by mouth every 8 (eight) hours as needed for spasms (abdominal spasms).  (Patient not taking: Reported on 11/22/2022)     No current facility-administered medications for this encounter.   Allergies  Allergen Reactions   Tramadol     Per Dr. Sharyon Medicus is renal failure, patient states that she is currently taking this medication and she has not had a reaction to Tramadol since this time.   Social History   Socioeconomic History   Marital status: Married    Spouse name: Not on file   Number of children: Not on file   Years of education: Not on file   Highest education level: Not on file  Occupational History   Not on file  Tobacco Use   Smoking status: Former    Packs/day: 0.50    Types: Cigarettes    Quit date: 2010    Years since quitting: 14.1   Smokeless tobacco: Never   Tobacco comments:    about one pack a day on a bad day  Vaping Use   Vaping Use: Never used  Substance and Sexual Activity   Alcohol use: No   Drug use: No   Sexual activity: Never    Birth control/protection: None  Other Topics Concern   Not on file  Social History Narrative   Not on file   Social Determinants of Health   Financial Resource Strain: Not on file  Food Insecurity: No Food Insecurity (07/25/2022)   Hunger Vital Sign    Worried About Running Out of Food in the Last Year: Never true    Ran Out of Food in the Last Year: Never true  Transportation Needs: No Transportation Needs (07/25/2022)    PRAPARE - Hydrologist (Medical): No    Lack of Transportation (Non-Medical): No  Physical Activity: Not on file  Stress: Not on file  Social Connections: Not on file  Intimate Partner Violence: Not At Risk (07/25/2022)   Humiliation, Afraid, Rape, and Kick questionnaire    Fear of Current or Ex-Partner: No    Emotionally Abused: No    Physically Abused: No    Sexually Abused: No   Family History  Problem Relation Age of Onset   Diabetes Mother    Throat cancer Mother    Uterine cancer Sister    Deep vein thrombosis Neg Hx    Pulmonary embolism Neg Hx    ROS: All systems reviewed  and negative except as per HPI.  BP 112/68   Pulse (!) 105   Ht '5\' 5"'$  (1.651 m)   Wt 84.9 kg (187 lb 3.2 oz)   LMP 04/20/2013   SpO2 97%   BMI 31.15 kg/m   Wt Readings from Last 3 Encounters:  11/22/22 84.9 kg (187 lb 3.2 oz)  11/10/22 88.4 kg (194 lb 12.8 oz)  10/06/22 88 kg (194 lb)   PHYSICAL EXAM: General: NAD Neck: No JVD, no thyromegaly or thyroid nodule.  Lungs: Clear to auscultation bilaterally with normal respiratory effort. CV: Nondisplaced PMI.  Heart regular S1/S2, no S3/S4, no murmur.  No peripheral edema.  No carotid bruit.  Normal pedal pulses.  Abdomen: Soft, nontender, no hepatosplenomegaly, no distention.  Skin: Intact without lesions or rashes.  Neurologic: Alert and oriented x 3.  Psych: Normal affect. Extremities: No clubbing or cyanosis.  HEENT: Normal.   ASSESSMENT & PLAN: 1. RV failure/cor pulmonale: Echo 11/23 showed EF 60-65%, D-shaped septum, moderate RV enlargement with severely decreased RV systolic function, IVC dilated. RHC with low PCWP, elevated right-sided filling pressures, mild PAH, preserved cardiac output. Suspect RV failure due to parenchymal lung disease (emphysema, smoking-related ILD noted on HRCT 4/23). NYHA class II by report but only 244 m by 6 minute walk (significant impairment).  She is not volume overloaded by exam.   - Continue torsemide 20 mg daily. BMET/BNP today.  - Continue spironolactone 12.5 daily.  - Continue Farxiga 10 mg daily.   2. 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  CT chests have shown emphysema and smoking-related respiratory bronchiolitis with ILD.  V/Q scan in 11/23 did not show evidence for PE.  - Discussed with Dr. Chase Caller, with ILD and mild pulmonary arterial hypertension will give her a trial of Tyvaso DPI.  - 6 minute walk done today as above.  3. Chronic hypoxemic respiratory failure: Baseline respiratory bronchiolitis-ILD + COPD.  Now off oxygen except with exertion.  - Follows with Pulmonary. 4. COPD: Emphysema on chest imaging.  Quit smoking 11/23.  5. Atrial fibrillation: Paroxysmal. Recent Zio monitor with atrial tachycardia but not AF noted.  - Continue Eliquis.   - Increase bisoprolol to 5 mg daily.  6. Cirrhosis: Possible cirrhosis by RUQ [place].  This may be due to RV failure.   Follow up in 3 months with APP.   Loralie Champagne  11/22/2022   11/22/22

## 2022-11-22 NOTE — Patient Instructions (Signed)
INCREASE Bisoprolol to 5 mg daily  ONCE APPROVED FOR TYVASO YOU WILL BE CONTACTED BY THE COMPANY.   Labs done today, your results will be available in MyChart, we will contact you for abnormal readings.  Your physician recommends that you schedule a follow-up appointment in: 3 months  If you have any questions or concerns before your next appointment please send [place] a message through [place] or call our office at [phone].    TO LEAVE A MESSAGE FOR THE NURSE SELECT OPTION 2, PLEASE LEAVE A MESSAGE INCLUDING: YOUR NAME DATE OF BIRTH CALL BACK NUMBER REASON FOR CALL**this is important as we prioritize the call backs  YOU WILL RECEIVE A CALL BACK THE SAME DAY AS LONG AS YOU CALL BEFORE 4:00 PM  At the [facility], you and your health needs are our priority. As part of our continuing mission to provide you with exceptional heart care, we have created designated Provider Care Teams. These Care Teams include your primary Cardiologist (physician) and Advanced Practice Providers (APPs- Physician Assistants and Nurse Practitioners) who all work together to provide you with the care you need, when you need it.   You may see any of the following providers on your designated Care Team at your next follow up: Dr Glori Bickers Dr Loralie Champagne Dr. Roxana Hires, NP Lyda Jester, [place] [facility] [place], [place] Forestine Na, NP Audry Riles, PharmD   Please be sure to bring in all your medications bottles to every appointment.    Thank you for choosing [facility]

## 2022-11-22 NOTE — Telephone Encounter (Signed)
Patient Advocate Encounter   Received notification from [facility] [employer] that prior authorization for Tyvaso DPI is required.   PA submitted on CoverMyMeds Key [id number] Status is pending   Will continue to follow.  Audry Riles, PharmD, BCPS, BCCP, CPP Heart Failure Clinic Pharmacist [phone]

## 2022-11-23 DIAGNOSIS — R739 Hyperglycemia, unspecified: Secondary | ICD-10-CM | POA: Diagnosis not present

## 2022-11-23 DIAGNOSIS — N179 Acute kidney failure, unspecified: Secondary | ICD-10-CM | POA: Diagnosis not present

## 2022-11-23 DIAGNOSIS — I480 Paroxysmal atrial fibrillation: Secondary | ICD-10-CM | POA: Diagnosis not present

## 2022-11-23 DIAGNOSIS — Z87891 Personal history of nicotine dependence: Secondary | ICD-10-CM | POA: Diagnosis not present

## 2022-11-23 DIAGNOSIS — J449 Chronic obstructive pulmonary disease, unspecified: Secondary | ICD-10-CM | POA: Diagnosis not present

## 2022-11-23 DIAGNOSIS — Z9981 Dependence on supplemental oxygen: Secondary | ICD-10-CM | POA: Diagnosis not present

## 2022-11-23 DIAGNOSIS — E1169 Type 2 diabetes mellitus with other specified complication: Secondary | ICD-10-CM | POA: Diagnosis not present

## 2022-11-23 DIAGNOSIS — N1832 Chronic kidney disease, stage 3b: Secondary | ICD-10-CM | POA: Diagnosis not present

## 2022-11-23 DIAGNOSIS — N390 Urinary tract infection, site not specified: Secondary | ICD-10-CM | POA: Diagnosis not present

## 2022-11-23 DIAGNOSIS — E1165 Type 2 diabetes mellitus with hyperglycemia: Secondary | ICD-10-CM | POA: Diagnosis not present

## 2022-11-23 DIAGNOSIS — Z6832 Body mass index (BMI) 32.0-32.9, adult: Secondary | ICD-10-CM | POA: Diagnosis not present

## 2022-11-23 DIAGNOSIS — I129 Hypertensive chronic kidney disease with stage 1 through stage 4 chronic kidney disease, or unspecified chronic kidney disease: Secondary | ICD-10-CM | POA: Diagnosis not present

## 2022-11-23 DIAGNOSIS — Z515 Encounter for palliative care: Secondary | ICD-10-CM | POA: Diagnosis not present

## 2022-11-23 NOTE — Telephone Encounter (Signed)
Advanced Heart Failure Patient Advocate Encounter  Prior Authorization for Tyvaso DPI has been denied. Appeal submitted to [facility] [employer].     Audry Riles, PharmD, BCPS, BCCP, CPP Heart Failure Clinic Pharmacist [phone]

## 2022-11-23 NOTE — Telephone Encounter (Signed)
I spoke to patient and informed her of results. She will head to ER this morning to be seen.

## 2022-11-24 NOTE — Telephone Encounter (Signed)
Advanced Heart Failure Patient Advocate Encounter  Prior Authorization appeal for Tyvaso DPI has been approved.    PA# [id number] Effective dates: 11/23/22 through 09/20/23  Audry Riles, PharmD, BCPS, BCCP, CPP Heart Failure Clinic Pharmacist [phone]

## 2022-11-24 NOTE — Telephone Encounter (Signed)
Enrollment application for Tyvaso DPI faxed to [place].   Audry Riles, PharmD, BCPS, BCCP, CPP Heart Failure Clinic Pharmacist [phone]

## 2022-11-25 DIAGNOSIS — N1832 Chronic kidney disease, stage 3b: Secondary | ICD-10-CM | POA: Diagnosis not present

## 2022-11-25 DIAGNOSIS — E1169 Type 2 diabetes mellitus with other specified complication: Secondary | ICD-10-CM | POA: Diagnosis not present

## 2022-11-25 DIAGNOSIS — N179 Acute kidney failure, unspecified: Secondary | ICD-10-CM | POA: Diagnosis not present

## 2022-11-25 DIAGNOSIS — I480 Paroxysmal atrial fibrillation: Secondary | ICD-10-CM | POA: Diagnosis not present

## 2022-11-25 DIAGNOSIS — I129 Hypertensive chronic kidney disease with stage 1 through stage 4 chronic kidney disease, or unspecified chronic kidney disease: Secondary | ICD-10-CM | POA: Diagnosis not present

## 2022-11-25 DIAGNOSIS — R739 Hyperglycemia, unspecified: Secondary | ICD-10-CM | POA: Diagnosis not present

## 2022-11-30 ENCOUNTER — Other Ambulatory Visit (HOSPITAL_COMMUNITY): Payer: Self-pay | Admitting: Psychiatry

## 2022-11-30 DIAGNOSIS — F4310 Post-traumatic stress disorder, unspecified: Secondary | ICD-10-CM

## 2022-12-05 DIAGNOSIS — I480 Paroxysmal atrial fibrillation: Secondary | ICD-10-CM | POA: Diagnosis not present

## 2022-12-05 DIAGNOSIS — I5021 Acute systolic (congestive) heart failure: Secondary | ICD-10-CM | POA: Diagnosis not present

## 2022-12-14 DIAGNOSIS — Z9049 Acquired absence of other specified parts of digestive tract: Secondary | ICD-10-CM | POA: Diagnosis not present

## 2022-12-14 DIAGNOSIS — I700 Atherosclerosis of aorta: Secondary | ICD-10-CM | POA: Diagnosis not present

## 2022-12-14 DIAGNOSIS — E139 Other specified diabetes mellitus without complications: Secondary | ICD-10-CM | POA: Diagnosis not present

## 2022-12-14 DIAGNOSIS — Z8542 Personal history of malignant neoplasm of other parts of uterus: Secondary | ICD-10-CM | POA: Diagnosis not present

## 2022-12-14 DIAGNOSIS — J849 Interstitial pulmonary disease, unspecified: Secondary | ICD-10-CM | POA: Diagnosis not present

## 2022-12-14 DIAGNOSIS — J449 Chronic obstructive pulmonary disease, unspecified: Secondary | ICD-10-CM | POA: Diagnosis not present

## 2022-12-14 DIAGNOSIS — F319 Bipolar disorder, unspecified: Secondary | ICD-10-CM | POA: Diagnosis not present

## 2022-12-14 DIAGNOSIS — F3341 Major depressive disorder, recurrent, in partial remission: Secondary | ICD-10-CM | POA: Diagnosis not present

## 2022-12-14 DIAGNOSIS — I10 Essential (primary) hypertension: Secondary | ICD-10-CM | POA: Diagnosis not present

## 2022-12-14 DIAGNOSIS — E785 Hyperlipidemia, unspecified: Secondary | ICD-10-CM | POA: Diagnosis not present

## 2022-12-14 DIAGNOSIS — E781 Pure hyperglyceridemia: Secondary | ICD-10-CM | POA: Diagnosis not present

## 2022-12-23 ENCOUNTER — Telehealth (HOSPITAL_COMMUNITY): Payer: Self-pay | Admitting: Pharmacy Technician

## 2022-12-23 NOTE — Telephone Encounter (Signed)
Advanced Heart Failure Patient Advocate Encounter  Received a message regarding affordability of Tyvaso from [facility]. "Looks like we are pending consent for high copay along with patient to return some forms for nursing . We have left a couple of messages requesting return call. If you are able to reach the patient please have her call us at [phone]."   Current co-pay, $2111.74 There is also a grant open right now the patient can be screened for. Will give patient information to call [facility] as well. Called and left the patient a message to return my call.  Charlann Boxer, CPhT

## 2022-12-24 ENCOUNTER — Telehealth (HOSPITAL_BASED_OUTPATIENT_CLINIC_OR_DEPARTMENT_OTHER): Payer: PPO | Admitting: Psychiatry

## 2022-12-24 ENCOUNTER — Encounter (HOSPITAL_COMMUNITY): Payer: Self-pay | Admitting: Psychiatry

## 2022-12-24 DIAGNOSIS — F4310 Post-traumatic stress disorder, unspecified: Secondary | ICD-10-CM | POA: Diagnosis not present

## 2022-12-24 DIAGNOSIS — F331 Major depressive disorder, recurrent, moderate: Secondary | ICD-10-CM

## 2022-12-24 DIAGNOSIS — F411 Generalized anxiety disorder: Secondary | ICD-10-CM

## 2022-12-24 DIAGNOSIS — J449 Chronic obstructive pulmonary disease, unspecified: Secondary | ICD-10-CM | POA: Diagnosis not present

## 2022-12-24 MED ORDER — CLONAZEPAM 0.25 MG PO TBDP: 0.2500 mg | ORAL_TABLET | Freq: Two times a day (BID) | ORAL | 2 refills | Status: DC | PRN

## 2022-12-24 MED ORDER — FLUOXETINE HCL 40 MG PO CAPS: ORAL_CAPSULE | ORAL | 2 refills | Status: DC

## 2022-12-24 MED ORDER — ARIPIPRAZOLE 5 MG PO TABS: 5.0000 mg | ORAL_TABLET | Freq: Every day | ORAL | 2 refills | Status: DC

## 2022-12-24 MED ORDER — DOXEPIN HCL 100 MG PO CAPS: 100.0000 mg | ORAL_CAPSULE | Freq: Every day | ORAL | 2 refills | Status: DC
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
## 2022-12-31 ENCOUNTER — Other Ambulatory Visit (HOSPITAL_COMMUNITY): Payer: Self-pay

## 2022-12-31 NOTE — Telephone Encounter (Signed)
Advanced Heart Failure Patient Advocate Encounter  Left patient another vm regarding Tyvaso.  Archer Asa, CPhT

## 2023-01-03 DIAGNOSIS — E119 Type 2 diabetes mellitus without complications: Secondary | ICD-10-CM | POA: Diagnosis not present

## 2023-01-03 DIAGNOSIS — Z515 Encounter for palliative care: Secondary | ICD-10-CM | POA: Diagnosis not present

## 2023-01-03 DIAGNOSIS — D6859 Other primary thrombophilia: Secondary | ICD-10-CM | POA: Diagnosis not present

## 2023-01-03 DIAGNOSIS — Z7901 Long term (current) use of anticoagulants: Secondary | ICD-10-CM | POA: Diagnosis not present

## 2023-01-03 DIAGNOSIS — Z794 Long term (current) use of insulin: Secondary | ICD-10-CM | POA: Diagnosis not present

## 2023-01-03 DIAGNOSIS — Z6832 Body mass index (BMI) 32.0-32.9, adult: Secondary | ICD-10-CM | POA: Diagnosis not present

## 2023-01-03 DIAGNOSIS — I480 Paroxysmal atrial fibrillation: Secondary | ICD-10-CM | POA: Diagnosis not present

## 2023-01-05 DIAGNOSIS — I5021 Acute systolic (congestive) heart failure: Secondary | ICD-10-CM | POA: Diagnosis not present

## 2023-01-05 DIAGNOSIS — I480 Paroxysmal atrial fibrillation: Secondary | ICD-10-CM | POA: Diagnosis not present

## 2023-01-13 DIAGNOSIS — E785 Hyperlipidemia, unspecified: Secondary | ICD-10-CM | POA: Diagnosis not present

## 2023-01-13 DIAGNOSIS — N1832 Chronic kidney disease, stage 3b: Secondary | ICD-10-CM | POA: Diagnosis not present

## 2023-01-13 DIAGNOSIS — E139 Other specified diabetes mellitus without complications: Secondary | ICD-10-CM | POA: Diagnosis not present

## 2023-01-13 DIAGNOSIS — Z794 Long term (current) use of insulin: Secondary | ICD-10-CM | POA: Diagnosis not present

## 2023-01-13 DIAGNOSIS — I130 Hypertensive heart and chronic kidney disease with heart failure and stage 1 through stage 4 chronic kidney disease, or unspecified chronic kidney disease: Secondary | ICD-10-CM | POA: Diagnosis not present

## 2023-01-23 DIAGNOSIS — J449 Chronic obstructive pulmonary disease, unspecified: Secondary | ICD-10-CM | POA: Diagnosis not present

## 2023-01-28 NOTE — Telephone Encounter (Signed)
Advanced Heart Failure Patient Advocate Encounter  Per [facility], "Patient is still unreachable for [facility]. Referral has been made a no start, but can be reopened if needed."  We have called and left the patient several messages as well. Will be here to assist in the future, as needed.  Archer Asa, CPhT

## 2023-02-15 ENCOUNTER — Other Ambulatory Visit (HOSPITAL_COMMUNITY): Payer: Self-pay | Admitting: Psychiatry

## 2023-02-15 DIAGNOSIS — F411 Generalized anxiety disorder: Secondary | ICD-10-CM

## 2023-02-15 DIAGNOSIS — F331 Major depressive disorder, recurrent, moderate: Secondary | ICD-10-CM

## 2023-02-15 DIAGNOSIS — F4310 Post-traumatic stress disorder, unspecified: Secondary | ICD-10-CM

## 2023-02-21 NOTE — Progress Notes (Incomplete)
Primary Care: Cleatis Polka., MD Pulmonary: Dr Kendrick Fries  HF Cardiologist: Dr. Shirlee Latch  HPI: 62 y.o. female smoker w/ h/o COPD, HTN, HLD, Type 2 DM, chronic pain syndrome/ fibromyalgia and pulmonary hypertension with RV failure/cor pulmonale.  She was admitted 11/23 with respiratory distress, treated with BiPAP. SCr elevated and unable to get CT chest. Echo showed EF 60-65%, severe RV failure and possible McConnell's sign, RA mod dilated, no MR. Felt respiratory distress related to CHF/RV failure. She was diuresed with IV lasix. RUQ [place] limited due to patient habitus but suggestive of possible liver disease/cirrhosis (coarsened hepatic echotexture). She underwent RHC showing low PCWP and elevated right heart pressures, suggesting cor pulmonale. V/Q scan negative for PE. She had a significant oxygen requirement and lung parenchymal disease felt to be significant. Continued to treat with steroids and oxygen.  GDMT initiated but limited by need for low-dose midodrine. She was discharged home on oxygen, weight 189 lbs.  Zio monitor in 1/24 showed multiple SVT episodes, possible atrial tachycardia, no atrial fibrillation.  Bisoprolol was started.   CT chest in 2/24 showed possible smoking-related ILD (respiratory bronchiolitis).   Follow up 3/24, reported NYHA II symptoms, but 244 m. Planned to trial Tyvaso.  Today she returns for HF follow up. Overall feeling fine. She is not SOB walking up steps or carrying groceries. Denies palpitations, abnormal bleeding, CP, dizziness, edema, or PND/Orthopnea. Appetite ok. No fever or chills. Weight at home 200 pounds. Taking all medications. PCP stopped Marcelline Deist over concerns for latent autoimmune DM and risk of DKA. Blood sugars better controlled now 120-140s. She quit smoking in 11/23. Not using CPAP (cannot tolerate mask). Never started Tyvaso after last visit. No longer using oxygen.  ECG (personally reviewed): NSR with PACs, 92 bpm  ReDs:  34%  Labs (11/23): K 4.0, creatinine 1.08 Labs (12/23): K 3.5, creatinine 0.99 Labs (3/24): K 4.0, creatinine 1.46  6 minute walk (3/24): 244 m  PMH: 1. COPD: Prior smoking.  2. 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   - CT chest (2/24) with suspected respiratory bronchiolitis-related ILD.  3. OSA: Cannot tolerate CPAP.  4. Atrial fibrillation: Paroxysmal.  5. Atrial tachycardia: Zio monitor in 1/24 showed multiple SVT episodes, possible atrial tachycardia, no atrial fibrillation.  6. RV failiure/cor pulmonale:  - Echo (11/23): EF 60-65%, grade I DD,  RV severely reduced systolic function.  - RHC (11/23): RA mean 11, PA 44/19 mean 28, PCWP mean 4, CO/CI (Fick) 5.46/2.65, PVR 4.4, PAPi 2.3  - V/Q scan (11/23): No evidence for chronic PE.  7. Cirrhosis: Possibly due to RV failure.  8. 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 HTN 10. Hyperlipidemia 11. Fibromyalgia 12. H/o endometrial cancer 13. H/o nephrolithiasis  Current Outpatient Medications  Medication Sig Dispense Refill   acetaminophen (TYLENOL) 500 MG tablet Take 500 mg by mouth every 6 (six) hours as needed.     apixaban (ELIQUIS) 5 MG TABS tablet Take 1 tablet (5 mg total) by mouth 2 (two) times daily. 60 tablet 0   ARIPiprazole (ABILIFY) 5 MG tablet Take 1 tablet (5 mg total) by mouth daily. 30 tablet 2   atorvastatin (LIPITOR) 40 MG tablet Take 40 mg by mouth at bedtime.      bisoprolol (ZEBETA) 5 MG tablet Take 1 tablet (5 mg total) by mouth daily. 90 tablet 3   budesonide (PULMICORT) 0.25 MG/2ML nebulizer solution Use 1 vial (0.25 mg total) by nebulization 2 (two) times daily. 120  mL 0   clonazePAM (KLONOPIN) 0.25 MG disintegrating tablet Take 1 tablet (0.25 mg total) by mouth 2 (two) times daily as needed (anxiety). 60 tablet 2   dicyclomine (BENTYL) 10 MG capsule Take 10 mg by mouth every 8 (eight) hours as needed for spasms (abdominal spasms).     doxepin (SINEQUAN) 100 MG capsule Take 1  capsule (100 mg total) by mouth at bedtime. 30 capsule 2   FLUoxetine (PROZAC) 40 MG capsule TAKE 1 CAPSULE BY MOUTH EVERY DAY 30 capsule 2   insulin degludec (TRESIBA FLEXTOUCH) 200 UNIT/ML FlexTouch Pen inject 44 units Subcutaneous once a day for 30 days     ipratropium-albuterol (DUONEB) 0.5-2.5 (3) MG/3ML SOLN Use 1 vial by nebulization every 6 (six) hours as needed. 360 mL 0   omeprazole (PRILOSEC) 40 MG capsule Take 40 mg by mouth daily before breakfast.   6   ONETOUCH VERIO test strip CHECK SUGARS TWICE A DAY DX  DM2, INSULIN DEPENDANT     Semaglutide,0.25 or 0.5MG /DOS, (OZEMPIC, 0.25 OR 0.5 MG/DOSE,) 2 MG/3ML SOPN Inject 1 Units into the skin once a week.     spironolactone (ALDACTONE) 25 MG tablet Take 0.5 tablets (12.5 mg total) by mouth daily. 30 tablet 0   torsemide (DEMADEX) 20 MG tablet Take 1 tablet (20 mg total) by mouth daily. 30 tablet 0   VENTOLIN HFA 108 (90 BASE) MCG/ACT inhaler Inhale 2 puffs into the lungs every 6 (six) hours as needed for wheezing or shortness of breath.      No current facility-administered medications for this encounter.   Allergies  Allergen Reactions   Tramadol     Per Dr. Threasa Beards is renal failure, patient states that she is currently taking this medication and she has not had a reaction to Tramadol since this time.   Social History   Socioeconomic History   Marital status: Married    Spouse name: Not on file   Number of children: Not on file   Years of education: Not on file   Highest education level: Not on file  Occupational History   Not on file  Tobacco Use   Smoking status: Former    Packs/day: .5    Types: Cigarettes    Quit date: 2010    Years since quitting: 14.4   Smokeless tobacco: Never   Tobacco comments:    about one pack a day on a bad day  Vaping Use   Vaping Use: Never used  Substance and Sexual Activity   Alcohol use: No   Drug use: No   Sexual activity: Never    Birth control/protection: None  Other  Topics Concern   Not on file  Social History Narrative   Not on file   Social Determinants of Health   Financial Resource Strain: Not on file  Food Insecurity: No Food Insecurity (07/25/2022)   Hunger Vital Sign    Worried About Running Out of Food in the Last Year: Never true    Ran Out of Food in the Last Year: Never true  Transportation Needs: No Transportation Needs (07/25/2022)   PRAPARE - Administrator, Civil Service (Medical): No    Lack of Transportation (Non-Medical): No  Physical Activity: Not on file  Stress: Not on file  Social Connections: Not on file  Intimate Partner Violence: Not At Risk (07/25/2022)   Humiliation, Afraid, Rape, and Kick questionnaire    Fear of Current or Ex-Partner: No    Emotionally Abused: No  Physically Abused: No    Sexually Abused: No   Family History  Problem Relation Age of Onset   Diabetes Mother    Throat cancer Mother    Uterine cancer Sister    Deep vein thrombosis Neg Hx    Pulmonary embolism Neg Hx    ROS: All systems reviewed and negative except as per HPI.  BP 100/68   Pulse 87   Wt 91.8 kg (202 lb 6.4 oz)   LMP 04/20/2013   SpO2 96%   BMI 33.68 kg/m   Wt Readings from Last 3 Encounters:  02/22/23 91.8 kg (202 lb 6.4 oz)  11/22/22 84.9 kg (187 lb 3.2 oz)  11/10/22 88.4 kg (194 lb 12.8 oz)   PHYSICAL EXAM: General:  NAD. No resp difficulty, walked into clinic HEENT: Normal Neck: Supple. No JVD. Carotids 2+ bilat; no bruits. No lymphadenopathy or thryomegaly appreciated. Cor: PMI nondisplaced. Irregular rate (PACs) & rhythm. No rubs, gallops or murmurs. Lungs: Clear Abdomen: Soft, nontender, nondistended. No hepatosplenomegaly. No bruits or masses. Good bowel sounds. Extremities: No cyanosis, clubbing, rash, edema Neuro: Alert & oriented x 3, cranial nerves grossly intact. Moves all 4 extremities w/o difficulty. Affect pleasant.  ASSESSMENT & PLAN: 1. RV failure/cor pulmonale: Echo 11/23 showed EF  60-65%, D-shaped septum, moderate RV enlargement with severely decreased RV systolic function, IVC dilated. RHC with low PCWP, elevated right-sided filling pressures, mild PAH, preserved cardiac output. Suspect RV failure due to parenchymal lung disease (emphysema, smoking-related ILD noted on HRCT 4/23). NYHA class II by report but only 244 m by 6 minute walk (significant impairment).  She is not volume overloaded by exam, REDs 34%.  - Continue torsemide 20 mg daily. BMET/BNP today.  - Continue spironolactone 12.5 daily.  - Now off Farxiga over concern for autoimmune DM and increased risk of DKA. 2. 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  CT chests have shown emphysema and smoking-related respiratory bronchiolitis with ILD.  V/Q scan in 11/23 did not show evidence for PE.  - Dr. Shirlee Latch discussed with Dr. Marchelle Gearing, with ILD and mild pulmonary arterial hypertension, planned to trial Tyvaso DPI. She was unable to get this, will reach out to PharmD to sort out. - Defer until next visit, after Tyvaso on board. 3. Chronic hypoxemic respiratory failure: Baseline respiratory bronchiolitis-ILD + COPD.  Now off oxygen. - Follows with Pulmonary. 4. COPD: Emphysema on chest imaging.  Quit smoking 11/23.  5. Atrial fibrillation: Paroxysmal. Recent Zio monitor with atrial tachycardia but not AF noted.  - Continue Eliquis.  No bleeding issues. - Continue bisoprolol 5 mg daily.  6. Cirrhosis: Possible cirrhosis by RUQ [place].  This may be due to RV failure.   Follow up in 3 months with Dr. Shirlee Latch.  [facility]  FNP-BC 02/22/2023

## 2023-02-22 ENCOUNTER — Other Ambulatory Visit (HOSPITAL_COMMUNITY): Payer: Self-pay

## 2023-02-22 ENCOUNTER — Encounter (HOSPITAL_COMMUNITY): Payer: Self-pay

## 2023-02-22 ENCOUNTER — Ambulatory Visit (HOSPITAL_COMMUNITY)
Admission: RE | Admit: 2023-02-22 | Discharge: 2023-02-22 | Disposition: A | Discharge status: Home / Self Care | Payer: PPO | Source: Ambulatory Visit | Attending: Family Medicine | Admitting: Family Medicine

## 2023-02-22 VITALS — BP 100/68 | HR 87 | Wt 202.4 lb

## 2023-02-22 DIAGNOSIS — G894 Chronic pain syndrome: Secondary | ICD-10-CM | POA: Diagnosis not present

## 2023-02-22 DIAGNOSIS — Z87442 Personal history of urinary calculi: Secondary | ICD-10-CM | POA: Diagnosis not present

## 2023-02-22 DIAGNOSIS — Z7951 Long term (current) use of inhaled steroids: Secondary | ICD-10-CM | POA: Insufficient documentation

## 2023-02-22 DIAGNOSIS — Z79899 Other long term (current) drug therapy: Secondary | ICD-10-CM | POA: Insufficient documentation

## 2023-02-22 DIAGNOSIS — Z87891 Personal history of nicotine dependence: Secondary | ICD-10-CM | POA: Insufficient documentation

## 2023-02-22 DIAGNOSIS — Z8542 Personal history of malignant neoplasm of other parts of uterus: Secondary | ICD-10-CM | POA: Insufficient documentation

## 2023-02-22 DIAGNOSIS — Z794 Long term (current) use of insulin: Secondary | ICD-10-CM | POA: Insufficient documentation

## 2023-02-22 DIAGNOSIS — I509 Heart failure, unspecified: Secondary | ICD-10-CM | POA: Diagnosis not present

## 2023-02-22 DIAGNOSIS — J449 Chronic obstructive pulmonary disease, unspecified: Secondary | ICD-10-CM | POA: Diagnosis not present

## 2023-02-22 DIAGNOSIS — J9611 Chronic respiratory failure with hypoxia: Secondary | ICD-10-CM

## 2023-02-22 DIAGNOSIS — I50810 Right heart failure, unspecified: Secondary | ICD-10-CM | POA: Diagnosis not present

## 2023-02-22 DIAGNOSIS — I480 Paroxysmal atrial fibrillation: Secondary | ICD-10-CM

## 2023-02-22 DIAGNOSIS — E119 Type 2 diabetes mellitus without complications: Secondary | ICD-10-CM | POA: Insufficient documentation

## 2023-02-22 DIAGNOSIS — G4733 Obstructive sleep apnea (adult) (pediatric): Secondary | ICD-10-CM | POA: Insufficient documentation

## 2023-02-22 DIAGNOSIS — K7460 Unspecified cirrhosis of liver: Secondary | ICD-10-CM | POA: Insufficient documentation

## 2023-02-22 DIAGNOSIS — I110 Hypertensive heart disease with heart failure: Secondary | ICD-10-CM | POA: Diagnosis not present

## 2023-02-22 DIAGNOSIS — E785 Hyperlipidemia, unspecified: Secondary | ICD-10-CM | POA: Diagnosis not present

## 2023-02-22 DIAGNOSIS — Z7901 Long term (current) use of anticoagulants: Secondary | ICD-10-CM | POA: Insufficient documentation

## 2023-02-22 DIAGNOSIS — I272 Pulmonary hypertension, unspecified: Secondary | ICD-10-CM

## 2023-02-22 DIAGNOSIS — I2781 Cor pulmonale (chronic): Secondary | ICD-10-CM | POA: Diagnosis not present

## 2023-02-22 DIAGNOSIS — M797 Fibromyalgia: Secondary | ICD-10-CM | POA: Insufficient documentation

## 2023-02-22 DIAGNOSIS — J849 Interstitial pulmonary disease, unspecified: Secondary | ICD-10-CM | POA: Diagnosis not present

## 2023-02-22 DIAGNOSIS — J439 Emphysema, unspecified: Secondary | ICD-10-CM | POA: Diagnosis not present

## 2023-02-22 DIAGNOSIS — K7469 Other cirrhosis of liver: Secondary | ICD-10-CM | POA: Diagnosis not present

## 2023-02-22 LAB — BASIC METABOLIC PANEL
Anion gap: 10 (ref 5–15)
BUN: 50 mg/dL — ABNORMAL HIGH (ref 8–23)
CO2: 27 mmol/L (ref 22–32)
Calcium: 9.4 mg/dL (ref 8.9–10.3)
Chloride: 99 mmol/L (ref 98–111)
Creatinine, Ser: 1.13 mg/dL — ABNORMAL HIGH (ref 0.44–1.00)
GFR, Estimated: 55 mL/min — ABNORMAL LOW (ref >60)
Glucose, Bld: 119 mg/dL — ABNORMAL HIGH (ref 70–99)
Potassium: 4.3 mmol/L (ref 3.5–5.1)
Sodium: 136 mmol/L (ref 135–145)

## 2023-02-22 LAB — CBC
HCT: 40.8 % (ref 36.0–46.0)
Hemoglobin: 13.3 g/dL (ref 12.0–15.0)
MCH: 28.6 pg (ref 26.0–34.0)
MCHC: 32.6 g/dL (ref 30.0–36.0)
MCV: 87.7 fL (ref 80.0–100.0)
Platelets: 165 10*3/uL (ref 150–400)
RBC: 4.65 MIL/uL (ref 3.87–5.11)
RDW: 14.2 % (ref 11.5–15.5)
WBC: 16.7 10*3/uL — ABNORMAL HIGH (ref 4.0–10.5)
nRBC: 0 % (ref 0.0–0.2)

## 2023-02-22 LAB — BRAIN NATRIURETIC PEPTIDE: B Natriuretic Peptide: 24 pg/mL (ref 0.0–100.0)

## 2023-02-22 NOTE — Patient Instructions (Addendum)
Thank you for coming in today  If you had labs drawn today, any labs that are abnormal the clinic will call you No news is good news  Medications: no changes   Follow up appointments:  Your physician recommends that you schedule a follow-up appointment in:  3 months with Dr. Shirlee Latch   Do the following things EVERYDAY: Weigh yourself in the morning before breakfast. Write it down and keep it in a log. Take your medicines as prescribed Eat low salt foods--Limit salt (sodium) to 2000 mg per day.  Stay as active as you can everyday Limit all fluids for the day to less than 2 liters   At the [facility], you and your health needs are our priority. As part of our continuing mission to provide you with exceptional heart care, we have created designated Provider Care Teams. These Care Teams include your primary Cardiologist (physician) and Advanced Practice Providers (APPs- Physician Assistants and Nurse Practitioners) who all work together to provide you with the care you need, when you need it.   You may see any of the following providers on your designated Care Team at your next follow up: Dr Arvilla Meres Dr Marca Ancona Dr. Marcos Eke, NP Robbie Lis, [place] [facility] [place], [place] Brynda Peon, NP Karle Plumber, PharmD   Please be sure to bring in all your medications bottles to every appointment.    Thank you for choosing [facility]  If you have any questions or concerns before your next appointment please send [place] a message through [place] or call our office at [phone].    TO LEAVE A MESSAGE FOR THE NURSE SELECT OPTION 2, PLEASE LEAVE A MESSAGE INCLUDING: YOUR NAME DATE OF BIRTH CALL BACK NUMBER REASON FOR CALL**this is important as we prioritize the call backs  YOU WILL RECEIVE A CALL BACK THE SAME DAY AS LONG AS YOU CALL BEFORE 4:00 PM

## 2023-02-22 NOTE — Progress Notes (Signed)
ReDS Vest / Clip - 02/22/23 1300       ReDS Vest / Clip   Station Marker A    Ruler Value 27.5    ReDS Value Range Low volume    ReDS Actual Value 34

## 2023-02-23 ENCOUNTER — Telehealth (HOSPITAL_COMMUNITY): Payer: Self-pay | Admitting: Pharmacy Technician

## 2023-02-23 DIAGNOSIS — J449 Chronic obstructive pulmonary disease, unspecified: Secondary | ICD-10-CM | POA: Diagnosis not present

## 2023-02-23 NOTE — Telephone Encounter (Signed)
Advanced Heart Failure Patient Advocate Encounter  Patient was seen in clinic yesterday and Tyvaso referral was signed for [place] to resubmit. Patient is aware that she will need to answer the phone/return calls in order for the referral process to be successful.  Sent to [facility] via fax.

## 2023-03-14 ENCOUNTER — Telehealth (HOSPITAL_COMMUNITY): Payer: Self-pay | Admitting: Pharmacy Technician

## 2023-03-14 DIAGNOSIS — F3341 Major depressive disorder, recurrent, in partial remission: Secondary | ICD-10-CM | POA: Diagnosis not present

## 2023-03-14 DIAGNOSIS — I10 Essential (primary) hypertension: Secondary | ICD-10-CM | POA: Diagnosis not present

## 2023-03-14 DIAGNOSIS — Z8542 Personal history of malignant neoplasm of other parts of uterus: Secondary | ICD-10-CM | POA: Diagnosis not present

## 2023-03-14 DIAGNOSIS — F17210 Nicotine dependence, cigarettes, uncomplicated: Secondary | ICD-10-CM | POA: Diagnosis not present

## 2023-03-14 DIAGNOSIS — E139 Other specified diabetes mellitus without complications: Secondary | ICD-10-CM | POA: Diagnosis not present

## 2023-03-14 DIAGNOSIS — J449 Chronic obstructive pulmonary disease, unspecified: Secondary | ICD-10-CM | POA: Diagnosis not present

## 2023-03-14 DIAGNOSIS — I700 Atherosclerosis of aorta: Secondary | ICD-10-CM | POA: Diagnosis not present

## 2023-03-14 DIAGNOSIS — Z9049 Acquired absence of other specified parts of digestive tract: Secondary | ICD-10-CM | POA: Diagnosis not present

## 2023-03-14 DIAGNOSIS — J849 Interstitial pulmonary disease, unspecified: Secondary | ICD-10-CM | POA: Diagnosis not present

## 2023-03-14 DIAGNOSIS — F319 Bipolar disorder, unspecified: Secondary | ICD-10-CM | POA: Diagnosis not present

## 2023-03-14 DIAGNOSIS — E785 Hyperlipidemia, unspecified: Secondary | ICD-10-CM | POA: Diagnosis not present

## 2023-03-14 NOTE — Telephone Encounter (Signed)
Advanced Heart Failure Patient Advocate Encounter  The patient was approved for a [employer] grant that will help cover the cost of Tyvaso. Total amount awarded, $10,000. Eligibility, 02/12/23 - 02/11/24.  ID [id number]  BIN [id number]  PCN [id number]  Group [id number]  Provided information to [facility].   Archer Asa, CPhT

## 2023-03-21 ENCOUNTER — Other Ambulatory Visit (HOSPITAL_COMMUNITY): Payer: Self-pay | Admitting: Psychiatry

## 2023-03-21 DIAGNOSIS — F4310 Post-traumatic stress disorder, unspecified: Secondary | ICD-10-CM

## 2023-03-21 DIAGNOSIS — F331 Major depressive disorder, recurrent, moderate: Secondary | ICD-10-CM

## 2023-03-21 DIAGNOSIS — F411 Generalized anxiety disorder: Secondary | ICD-10-CM

## 2023-03-22 ENCOUNTER — Other Ambulatory Visit (HOSPITAL_COMMUNITY): Payer: Self-pay | Admitting: Pharmacist

## 2023-03-22 ENCOUNTER — Telehealth (HOSPITAL_BASED_OUTPATIENT_CLINIC_OR_DEPARTMENT_OTHER): Payer: PPO | Admitting: Psychiatry

## 2023-03-22 ENCOUNTER — Encounter (HOSPITAL_COMMUNITY): Payer: Self-pay | Admitting: Psychiatry

## 2023-03-22 VITALS — Wt 200.0 lb

## 2023-03-22 DIAGNOSIS — F4310 Post-traumatic stress disorder, unspecified: Secondary | ICD-10-CM | POA: Diagnosis not present

## 2023-03-22 DIAGNOSIS — F411 Generalized anxiety disorder: Secondary | ICD-10-CM

## 2023-03-22 DIAGNOSIS — F331 Major depressive disorder, recurrent, moderate: Secondary | ICD-10-CM | POA: Diagnosis not present

## 2023-03-22 MED ORDER — FLUOXETINE HCL 40 MG PO CAPS: ORAL_CAPSULE | ORAL | 2 refills | Status: DC

## 2023-03-22 MED ORDER — DOXEPIN HCL 100 MG PO CAPS: 100.0000 mg | ORAL_CAPSULE | Freq: Every day | ORAL | 2 refills | Status: DC

## 2023-03-22 MED ORDER — CLONAZEPAM 0.25 MG PO TBDP: 0.2500 mg | ORAL_TABLET | Freq: Two times a day (BID) | ORAL | 2 refills | Status: DC | PRN

## 2023-03-22 MED ORDER — ARIPIPRAZOLE 5 MG PO TABS: 5.0000 mg | ORAL_TABLET | Freq: Every day | ORAL | 2 refills | Status: DC

## 2023-03-22 MED ORDER — TYVASO DPI TITRATION KIT 16 & 32 & 48 MCG IN POWD: RESPIRATORY_TRACT | 3 refills | Status: DC
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
## 2023-03-22 NOTE — Telephone Encounter (Signed)
Advanced Heart Failure Patient Advocate Encounter  Reached out to [facility] regarding status of Tyvaso referral. Benefits investigation has been done, and patient is cleared to start the medication. They are pending pharmacy counseling of medication. I inquired when they would reach out to the patient. Representative stated that they would reach out today.  Called and spoke with the patient. Reminded her that [facility] will call from an 800# and to call back if she misses the call.   Will follow up.

## 2023-03-25 ENCOUNTER — Telehealth (HOSPITAL_COMMUNITY): Payer: Self-pay | Admitting: Psychiatry

## 2023-03-25 DIAGNOSIS — J449 Chronic obstructive pulmonary disease, unspecified: Secondary | ICD-10-CM | POA: Diagnosis not present

## 2023-03-25 NOTE — Telephone Encounter (Signed)
Advanced Heart Failure Patient Advocate Encounter  Checked referral status with [facility], they left the patient a vm and will try again. Advised that patient can call. [phone].  Called and spoke with the patient. Stated she would call when we got off the phone.   Will follow up.

## 2023-04-05 NOTE — Telephone Encounter (Signed)
Advanced Heart Failure Patient Advocate Encounter  Confirmed with [facility] that Tyvaso titration kit was delivered 03/31/23.  Archer Asa, CPhT

## 2023-04-15 ENCOUNTER — Telehealth (HOSPITAL_COMMUNITY): Payer: Self-pay | Admitting: Pharmacy Technician

## 2023-04-15 NOTE — Telephone Encounter (Signed)
Advanced Heart Failure Patient Advocate Encounter  Received message from [facility] that patient hsa been increased to 32 mcg inhaled four times daily. "Pt states her breathing is the same. She has not had to use oxygen nor has her oxygen needs increased. She denies any side effects. Breathing technique is good."  Archer Asa, CPhT

## 2023-04-25 DIAGNOSIS — J449 Chronic obstructive pulmonary disease, unspecified: Secondary | ICD-10-CM | POA: Diagnosis not present

## 2023-04-26 DIAGNOSIS — N1832 Chronic kidney disease, stage 3b: Secondary | ICD-10-CM | POA: Diagnosis not present

## 2023-04-26 DIAGNOSIS — Z794 Long term (current) use of insulin: Secondary | ICD-10-CM | POA: Diagnosis not present

## 2023-04-26 DIAGNOSIS — E139 Other specified diabetes mellitus without complications: Secondary | ICD-10-CM | POA: Diagnosis not present

## 2023-04-26 DIAGNOSIS — E785 Hyperlipidemia, unspecified: Secondary | ICD-10-CM | POA: Diagnosis not present

## 2023-04-26 DIAGNOSIS — I130 Hypertensive heart and chronic kidney disease with heart failure and stage 1 through stage 4 chronic kidney disease, or unspecified chronic kidney disease: Secondary | ICD-10-CM | POA: Diagnosis not present

## 2023-04-27 ENCOUNTER — Other Ambulatory Visit (HOSPITAL_COMMUNITY): Payer: Self-pay | Admitting: Psychiatry

## 2023-04-27 DIAGNOSIS — F4310 Post-traumatic stress disorder, unspecified: Secondary | ICD-10-CM

## 2023-04-27 DIAGNOSIS — F411 Generalized anxiety disorder: Secondary | ICD-10-CM

## 2023-05-26 ENCOUNTER — Ambulatory Visit (HOSPITAL_COMMUNITY)
Admission: RE | Admit: 2023-05-26 | Discharge: 2023-05-26 | Disposition: A | Discharge status: Home / Self Care | Payer: PPO | Source: Ambulatory Visit | Attending: Cardiology | Admitting: Cardiology

## 2023-05-26 ENCOUNTER — Telehealth (HOSPITAL_COMMUNITY): Payer: Self-pay | Admitting: Licensed Clinical Social Worker

## 2023-05-26 ENCOUNTER — Encounter (HOSPITAL_COMMUNITY): Payer: Self-pay | Admitting: Cardiology

## 2023-05-26 VITALS — BP 100/60 | HR 88 | Wt 216.4 lb

## 2023-05-26 DIAGNOSIS — I480 Paroxysmal atrial fibrillation: Secondary | ICD-10-CM | POA: Insufficient documentation

## 2023-05-26 DIAGNOSIS — G8929 Other chronic pain: Secondary | ICD-10-CM | POA: Diagnosis not present

## 2023-05-26 DIAGNOSIS — I5021 Acute systolic (congestive) heart failure: Secondary | ICD-10-CM | POA: Diagnosis not present

## 2023-05-26 DIAGNOSIS — Z87891 Personal history of nicotine dependence: Secondary | ICD-10-CM | POA: Diagnosis not present

## 2023-05-26 DIAGNOSIS — J9611 Chronic respiratory failure with hypoxia: Secondary | ICD-10-CM | POA: Diagnosis not present

## 2023-05-26 DIAGNOSIS — G894 Chronic pain syndrome: Secondary | ICD-10-CM | POA: Diagnosis present

## 2023-05-26 DIAGNOSIS — I50810 Right heart failure, unspecified: Secondary | ICD-10-CM | POA: Insufficient documentation

## 2023-05-26 DIAGNOSIS — Z7901 Long term (current) use of anticoagulants: Secondary | ICD-10-CM | POA: Insufficient documentation

## 2023-05-26 DIAGNOSIS — I2729 Other secondary pulmonary hypertension: Secondary | ICD-10-CM | POA: Diagnosis not present

## 2023-05-26 DIAGNOSIS — M797 Fibromyalgia: Secondary | ICD-10-CM | POA: Insufficient documentation

## 2023-05-26 DIAGNOSIS — E785 Hyperlipidemia, unspecified: Secondary | ICD-10-CM | POA: Diagnosis not present

## 2023-05-26 DIAGNOSIS — E119 Type 2 diabetes mellitus without complications: Secondary | ICD-10-CM | POA: Diagnosis not present

## 2023-05-26 DIAGNOSIS — Z6836 Body mass index (BMI) 36.0-36.9, adult: Secondary | ICD-10-CM | POA: Insufficient documentation

## 2023-05-26 DIAGNOSIS — I2781 Cor pulmonale (chronic): Secondary | ICD-10-CM | POA: Diagnosis not present

## 2023-05-26 DIAGNOSIS — J449 Chronic obstructive pulmonary disease, unspecified: Secondary | ICD-10-CM | POA: Diagnosis not present

## 2023-05-26 DIAGNOSIS — I110 Hypertensive heart disease with heart failure: Secondary | ICD-10-CM | POA: Diagnosis not present

## 2023-05-26 DIAGNOSIS — E669 Obesity, unspecified: Secondary | ICD-10-CM | POA: Diagnosis not present

## 2023-05-26 DIAGNOSIS — I10 Essential (primary) hypertension: Secondary | ICD-10-CM | POA: Diagnosis present

## 2023-05-26 LAB — BASIC METABOLIC PANEL
Anion gap: 13 (ref 5–15)
BUN: 35 mg/dL — ABNORMAL HIGH (ref 8–23)
CO2: 23 mmol/L (ref 22–32)
Calcium: 9.8 mg/dL (ref 8.9–10.3)
Chloride: 102 mmol/L (ref 98–111)
Creatinine, Ser: 1.14 mg/dL — ABNORMAL HIGH (ref 0.44–1.00)
GFR, Estimated: 54 mL/min — ABNORMAL LOW (ref >60)
Glucose, Bld: 104 mg/dL — ABNORMAL HIGH (ref 70–99)
Potassium: 4 mmol/L (ref 3.5–5.1)
Sodium: 138 mmol/L (ref 135–145)

## 2023-05-26 LAB — BRAIN NATRIURETIC PEPTIDE: B Natriuretic Peptide: 33.2 pg/mL (ref 0.0–100.0)

## 2023-05-26 NOTE — Telephone Encounter (Signed)
[name] has not been engaged in therapy since 05/17/22.  Clinician Carolan Shiver by phone today at 1:54pm to inquire about whether she intends to make a followup appointment soon, and offer assistance with scheduling and referral resources if needed.  [name] did not answer this phone call, so clinician left a voicemail informing her that she would be discharged 30 days from now unless she makes an effort to outreach either I or office staff within 30 day window.  Clinician provided contact numbers for our office in order to return call.     Noralee Stain, LCSW, LCAS 05/26/23

## 2023-05-26 NOTE — Patient Instructions (Signed)
Medication Changes:  No Changes In Medications at this time.   Lab Work:  Labs done today, your results will be available in MyChart, we will contact you for abnormal readings.  Testing/Procedures:  Your physician has requested that you have an echocardiogram in 3 months . Echocardiography is a painless test that uses sound waves to create images of your heart. It provides your doctor with information about the size and shape of your heart and how well your heart's chambers and valves are working. This procedure takes approximately one hour. There are no restrictions for this procedure. Please do NOT wear cologne, perfume, aftershave, or lotions (deodorant is allowed). Please arrive 15 minutes prior to your appointment time.  Referrals:  YOU HAVE BEEN REFERRED TO PHARMACY CLINIC AT [facility] THEY WILL REACH OUT TO YOU OR CALL TO ARRANGE THIS. PLEASE CALL us WITH ANY CONCERNS   Follow-Up in: 3 MONTHS WITH DR. Shirlee Latch AS SCHEDULED   At [facility], you and your health needs are our priority. We have a designated team specialized in the treatment of Heart Failure. This Care Team includes your primary Heart Failure Specialized Cardiologist (physician), Advanced Practice Providers (APPs- Physician Assistants and Nurse Practitioners), and Pharmacist who all work together to provide you with the care you need, when you need it.   You may see any of the following providers on your designated Care Team at your next follow up:  Dr. Arvilla Meres Dr. Marca Ancona Dr. Marcos Eke, NP Robbie Lis, [place] [facility] [place], [place] Brynda Peon, NP Karle Plumber, PharmD   Please be sure to bring in all your medications bottles to every appointment.   Need to Contact us:  If you have any questions or concerns before your next appointment please send [place] a message through [place] or call our office at [phone].    TO LEAVE A MESSAGE FOR THE  NURSE SELECT OPTION 2, PLEASE LEAVE A MESSAGE INCLUDING: YOUR NAME DATE OF BIRTH CALL BACK NUMBER REASON FOR CALL**this is important as we prioritize the call backs  YOU WILL RECEIVE A CALL BACK THE SAME DAY AS LONG AS YOU CALL BEFORE 4:00 PM

## 2023-05-26 NOTE — Progress Notes (Signed)
6 Min Walk Test Completed  Pt ambulated 1052ft (304.78m) O2 Sat ranged 91-93 on NoneL oxygen HR ranged 109-127

## 2023-05-29 NOTE — Progress Notes (Signed)
Primary Care: Cleatis Polka., MD Pulmonary: Dr Kendrick Fries  HF Cardiologist: Dr. Shirlee Latch  HPI: 62 y.o. female smoker w/ h/o COPD, HTN, HLD, Type 2 DM, chronic pain syndrome/ fibromyalgia and pulmonary hypertension with RV failure/cor pulmonale.  She was admitted 11/23 with respiratory distress, treated with BiPAP. SCr elevated and unable to get CT chest. Echo showed EF 60-65%, severe RV failure and possible McConnell's sign, RA mod dilated, no MR. Felt respiratory distress related to CHF/RV failure. She was diuresed with IV lasix. RUQ [place] limited due to patient habitus but suggestive of possible liver disease/cirrhosis (coarsened hepatic echotexture). She underwent RHC showing low PCWP and elevated right heart pressures, suggesting cor pulmonale. V/Q scan negative for PE. She had a significant oxygen requirement and lung parenchymal disease felt to be significant. Continued to treat with steroids and oxygen.  GDMT initiated but limited by need for low-dose midodrine. She was discharged home on oxygen, weight 189 lbs.  Zio monitor in 1/24 showed multiple SVT episodes, possible atrial tachycardia, no atrial fibrillation.  Bisoprolol was started.   CT chest in 2/24 showed possible smoking-related ILD (respiratory bronchiolitis).   Today she returns for HF follow up. Not using oxygen.  Staying off cigarettes but has gained about 14 lbs since last appointment.  No side effects with Tyvaso, up to 64 mcg qid. No dyspnea walking on flat ground.  No lightheadedness or chest pain. 6 minute walk improved today. PCP stopped [place].   ECG (personally reviewed): NSR with sinus arrhythmia  Labs (11/23): K 4.0, creatinine 1.08 Labs (12/23): K 3.5, creatinine 0.99 Labs (3/24): K 4.0, creatinine 1.46 Labs (6/24): K 4.3, creatinine 1.13  6 minute walk (3/24): 244 m 6 minute walk (9/24): 305 m  PMH: 1. COPD: Prior smoking.  2. 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   - CT chest  (2/24) with suspected respiratory bronchiolitis-related ILD.  3. OSA: Cannot tolerate CPAP.  4. Atrial fibrillation: Paroxysmal.  5. Atrial tachycardia: Zio monitor in 1/24 showed multiple SVT episodes, possible atrial tachycardia, no atrial fibrillation.  6. RV failiure/cor pulmonale:  - Echo (11/23): EF 60-65%, grade I DD,  RV severely reduced systolic function.  - RHC (11/23): RA mean 11, PA 44/19 mean 28, PCWP mean 4, CO/CI (Fick) 5.46/2.65, PVR 4.4, PAPi 2.3  - V/Q scan (11/23): No evidence for chronic PE.  7. Cirrhosis: Possibly due to RV failure.  8. 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 HTN 10. Hyperlipidemia 11. Fibromyalgia 12. H/o endometrial cancer 13. H/o nephrolithiasis  Current Outpatient Medications  Medication Sig Dispense Refill   acetaminophen (TYLENOL) 500 MG tablet Take 500 mg by mouth every 6 (six) hours as needed.     apixaban (ELIQUIS) 5 MG TABS tablet Take 1 tablet (5 mg total) by mouth 2 (two) times daily. 60 tablet 0   ARIPiprazole (ABILIFY) 5 MG tablet Take 1 tablet (5 mg total) by mouth daily. 30 tablet 2   atorvastatin (LIPITOR) 40 MG tablet Take 40 mg by mouth at bedtime.      bisoprolol (ZEBETA) 5 MG tablet Take 1 tablet (5 mg total) by mouth daily. 90 tablet 3   budesonide (PULMICORT) 0.25 MG/2ML nebulizer solution Use 1 vial (0.25 mg total) by nebulization 2 (two) times daily. 120 mL 0   clonazePAM (KLONOPIN) 0.25 MG disintegrating tablet Take 1 tablet (0.25 mg total) by mouth 2 (two) times daily as needed (anxiety). 60 tablet 2   dicyclomine (BENTYL) 10 MG capsule Take  10 mg by mouth every 8 (eight) hours as needed for spasms (abdominal spasms).     doxepin (SINEQUAN) 100 MG capsule Take 1 capsule (100 mg total) by mouth at bedtime. 30 capsule 2   FLUoxetine (PROZAC) 40 MG capsule TAKE 1 CAPSULE BY MOUTH EVERY DAY 30 capsule 2   insulin degludec (TRESIBA FLEXTOUCH) 200 UNIT/ML FlexTouch Pen inject 44 units Subcutaneous once a day for 30 days      ipratropium-albuterol (DUONEB) 0.5-2.5 (3) MG/3ML SOLN Use 1 vial by nebulization every 6 (six) hours as needed. 360 mL 0   omeprazole (PRILOSEC) 40 MG capsule Take 40 mg by mouth daily before breakfast.   6   ONETOUCH VERIO test strip CHECK SUGARS TWICE A DAY DX  DM2, INSULIN DEPENDANT     spironolactone (ALDACTONE) 25 MG tablet Take 0.5 tablets (12.5 mg total) by mouth daily. 30 tablet 0   torsemide (DEMADEX) 20 MG tablet Take 1 tablet (20 mg total) by mouth daily. 30 tablet 0   Treprostinil (TYVASO DPI MAINTENANCE KIT) 64 MCG POWD Inhale 64 Inhalations into the lungs in the morning, at noon, in the evening, and at bedtime.     VENTOLIN HFA 108 (90 BASE) MCG/ACT inhaler Inhale 2 puffs into the lungs every 6 (six) hours as needed for wheezing or shortness of breath.      Semaglutide,0.25 or 0.5MG /DOS, (OZEMPIC, 0.25 OR 0.5 MG/DOSE,) 2 MG/3ML SOPN Inject 1 Units into the skin once a week. (Patient not taking: Reported on 05/26/2023)     No current facility-administered medications for this encounter.   Allergies  Allergen Reactions   Tramadol     Per Dr. Threasa Beards is renal failure, patient states that she is currently taking this medication and she has not had a reaction to Tramadol since this time.   Social History   Socioeconomic History   Marital status: Married    Spouse name: Not on file   Number of children: Not on file   Years of education: Not on file   Highest education level: Not on file  Occupational History   Not on file  Tobacco Use   Smoking status: Former    Current packs/day: 0.00    Types: Cigarettes    Quit date: 2010    Years since quitting: 14.6   Smokeless tobacco: Never   Tobacco comments:    about one pack a day on a bad day  Vaping Use   Vaping status: Never Used  Substance and Sexual Activity   Alcohol use: No   Drug use: No   Sexual activity: Never    Birth control/protection: None  Other Topics Concern   Not on file  Social History  Narrative   Not on file   Social Determinants of Health   Financial Resource Strain: Not on file  Food Insecurity: No Food Insecurity (07/25/2022)   Hunger Vital Sign    Worried About Running Out of Food in the Last Year: Never true    Ran Out of Food in the Last Year: Never true  Transportation Needs: No Transportation Needs (07/25/2022)   PRAPARE - Administrator, Civil Service (Medical): No    Lack of Transportation (Non-Medical): No  Physical Activity: Not on file  Stress: Not on file  Social Connections: Not on file  Intimate Partner Violence: Not At Risk (07/25/2022)   Humiliation, Afraid, Rape, and Kick questionnaire    Fear of Current or Ex-Partner: No    Emotionally Abused: No  Physically Abused: No    Sexually Abused: No   Family History  Problem Relation Age of Onset   Diabetes Mother    Throat cancer Mother    Uterine cancer Sister    Deep vein thrombosis Neg Hx    Pulmonary embolism Neg Hx    ROS: All systems reviewed and negative except as per HPI.  BP 100/60   Pulse 88   Wt 98.2 kg (216 lb 6.4 oz)   LMP 04/20/2013   SpO2 98%   BMI 36.01 kg/m   Wt Readings from Last 3 Encounters:  05/26/23 98.2 kg (216 lb 6.4 oz)  02/22/23 91.8 kg (202 lb 6.4 oz)  11/22/22 84.9 kg (187 lb 3.2 oz)   PHYSICAL EXAM: General: NAD Neck: No JVD, no thyromegaly or thyroid nodule.  Lungs: Clear to auscultation bilaterally with normal respiratory effort. CV: Nondisplaced PMI.  Heart regular S1/S2, no S3/S4, no murmur.  No peripheral edema.  No carotid bruit.  Normal pedal pulses.  Abdomen: Soft, nontender, no hepatosplenomegaly, no distention.  Skin: Intact without lesions or rashes.  Neurologic: Alert and oriented x 3.  Psych: Normal affect. Extremities: No clubbing or cyanosis.  HEENT: Normal.   ASSESSMENT & PLAN: 1. RV failure/cor pulmonale: Echo 11/23 showed EF 60-65%, D-shaped septum, moderate RV enlargement with severely decreased RV systolic function,  IVC dilated. RHC with low PCWP, elevated right-sided filling pressures, mild PAH, preserved cardiac output. Suspect RV failure due to parenchymal lung disease (emphysema, smoking-related ILD noted on HRCT 4/23). NYHA class II, not volume overloaded on exam.  - Continue torsemide 20 mg daily. BMET/BNP today.  - Continue spironolactone 12.5 daily.  - Repeat echo at followup.  - Now off Farxiga per PCP over concern for autoimmune DM and increased risk of DKA. 2. 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  CT chests have shown emphysema and smoking-related respiratory bronchiolitis with ILD.  V/Q scan in 11/23 did not show evidence for PE. She started on Tyvaso for PH-ILD and has improved 6 minute walk now that she is up to 64 mcg qid.  - Continue Tyvaso 64 mcg qid. 3. Chronic hypoxemic respiratory failure: Baseline respiratory bronchiolitis-ILD + COPD.  Now off oxygen. - Follows with Pulmonary. 4. COPD: Emphysema on chest imaging.  Quit smoking 11/23. Weight gain after quitting cigarettes.  5. Atrial fibrillation: Paroxysmal. NSR  today.   - Continue Eliquis.   - Continue bisoprolol 5 mg daily.  6. Cirrhosis: Possible cirrhosis by RUQ [place].  This may be due to RV failure.  7. Obesity: I will check with pharmacy clinic again to see if she could get semaglutide or tirzepatide.   Follow up in 3 months with echo  Marca Ancona   05/29/2023

## 2023-06-03 ENCOUNTER — Ambulatory Visit: Payer: PPO | Attending: Cardiology

## 2023-06-03 ENCOUNTER — Other Ambulatory Visit (HOSPITAL_COMMUNITY): Payer: Self-pay

## 2023-06-03 NOTE — Progress Notes (Deleted)
Office Visit    Patient Name: [name] Date of Encounter: 06/03/2023  Primary Care Provider:  Cleatis Polka., MD Primary Cardiologist:  None  Chief Complaint    Weight management  Significant Past Medical History   RV failure Suspect 2/2 parechymal lung disease, on torsemide, spironolactone; d/c dapagliflozin 2/2 autoimmune DM  COPD Emphysema, quit smoking 07/2022, did have weight gain  AF Paroxysmal - CHADS2VASc = 5,on Eliquis  DM1 autoimmune       Allergies  Allergen Reactions   Tramadol     Per Dr. Threasa Beards is renal failure, patient states that she is currently taking this medication and she has not had a reaction to Tramadol since this time.    History of Present Illness    [name] is a 63 y.o. female patient of Dr Shirlee Latch, in the office today to discuss options for weight management  On Tresiba (200 U/ml) - 44 U daily   Current weight management medications:   Previously tried meds:   Current meds that may affect weight:   Baseline weight/BMI:   Insurance payor:   Diet:   Exercise:   Family History:   Confirmed patient not ***pregnant and no personal or family history of medullary thyroid carcinoma (MTC) or Multiple Endocrine Neoplasia syndrome type 2 (MEN 2).   Social History:   Tobacco:  Alcohol:  Caffeine:   Adherence Assessment  Do you ever forget to take your medication? [] Yes [] No  Do you ever skip doses due to side effects? [] Yes [] No  Do you have trouble affording your medicines? [] Yes [] No  Are you ever unable to pick up your medication due to transportation difficulties? [] Yes [] No  Do you ever stop taking your medications because you don't believe they are helping? [] Yes [] No  Do you check your weight daily? [] Yes [] No   Adherence strategy: ***  Barriers to obtaining medications: ***   Accessory Clinical Findings    Lab Results  Component Value Date   CREATININE 1.14 (H) 05/26/2023    BUN 35 (H) 05/26/2023   NA 138 05/26/2023   K 4.0 05/26/2023   CL 102 05/26/2023   CO2 23 05/26/2023   Lab Results  Component Value Date   ALT 9 07/24/2022   AST 38 07/24/2022   ALKPHOS 141 (H) 07/24/2022   BILITOT 1.9 (H) 07/24/2022   Lab Results  Component Value Date   HGBA1C 6.2 (H) 07/23/2022      Home Medications/Allergies    Current Outpatient Medications  Medication Sig Dispense Refill   acetaminophen (TYLENOL) 500 MG tablet Take 500 mg by mouth every 6 (six) hours as needed.     apixaban (ELIQUIS) 5 MG TABS tablet Take 1 tablet (5 mg total) by mouth 2 (two) times daily. 60 tablet 0   ARIPiprazole (ABILIFY) 5 MG tablet Take 1 tablet (5 mg total) by mouth daily. 30 tablet 2   atorvastatin (LIPITOR) 40 MG tablet Take 40 mg by mouth at bedtime.      bisoprolol (ZEBETA) 5 MG tablet Take 1 tablet (5 mg total) by mouth daily. 90 tablet 3   budesonide (PULMICORT) 0.25 MG/2ML nebulizer solution Use 1 vial (0.25 mg total) by nebulization 2 (two) times daily. 120 mL 0   clonazePAM (KLONOPIN) 0.25 MG disintegrating tablet Take 1 tablet (0.25 mg total) by mouth 2 (two) times daily as needed (anxiety). 60 tablet 2   dicyclomine (BENTYL) 10 MG capsule Take 10 mg by mouth every  8 (eight) hours as needed for spasms (abdominal spasms).     doxepin (SINEQUAN) 100 MG capsule Take 1 capsule (100 mg total) by mouth at bedtime. 30 capsule 2   FLUoxetine (PROZAC) 40 MG capsule TAKE 1 CAPSULE BY MOUTH EVERY DAY 30 capsule 2   insulin degludec (TRESIBA FLEXTOUCH) 200 UNIT/ML FlexTouch Pen inject 44 units Subcutaneous once a day for 30 days     ipratropium-albuterol (DUONEB) 0.5-2.5 (3) MG/3ML SOLN Use 1 vial by nebulization every 6 (six) hours as needed. 360 mL 0   omeprazole (PRILOSEC) 40 MG capsule Take 40 mg by mouth daily before breakfast.   6   ONETOUCH VERIO test strip CHECK SUGARS TWICE A DAY DX  DM2, INSULIN DEPENDANT     Semaglutide,0.25 or 0.5MG /DOS, (OZEMPIC, 0.25 OR 0.5 MG/DOSE,) 2  MG/3ML SOPN Inject 1 Units into the skin once a week. (Patient not taking: Reported on 05/26/2023)     spironolactone (ALDACTONE) 25 MG tablet Take 0.5 tablets (12.5 mg total) by mouth daily. 30 tablet 0   torsemide (DEMADEX) 20 MG tablet Take 1 tablet (20 mg total) by mouth daily. 30 tablet 0   Treprostinil (TYVASO DPI MAINTENANCE KIT) 64 MCG POWD Inhale 64 Inhalations into the lungs in the morning, at noon, in the evening, and at bedtime.     VENTOLIN HFA 108 (90 BASE) MCG/ACT inhaler Inhale 2 puffs into the lungs every 6 (six) hours as needed for wheezing or shortness of breath.      No current facility-administered medications for this visit.     Allergies  Allergen Reactions   Tramadol     Per Dr. Threasa Beards is renal failure, patient states that she is currently taking this medication and she has not had a reaction to Tramadol since this time.    Assessment & Plan    No problem-specific Assessment & Plan notes found for this encounter.   Phillips Hay PharmD CPP [facility]  [address] [place], [place] [address] [phone]

## 2023-06-09 ENCOUNTER — Telehealth (HOSPITAL_COMMUNITY): Payer: Self-pay

## 2023-06-09 NOTE — Telephone Encounter (Signed)
Called and left message for patient to complete her Itamar test before Friday 06/17/23. If she in unable to complete it before then, have asked her to return it to the office, otherwise she will be charged for the device.

## 2023-06-16 ENCOUNTER — Other Ambulatory Visit (HOSPITAL_COMMUNITY): Payer: Self-pay

## 2023-06-16 ENCOUNTER — Ambulatory Visit: Payer: PPO

## 2023-06-21 ENCOUNTER — Telehealth (HOSPITAL_COMMUNITY): Payer: PPO | Admitting: Psychiatry

## 2023-06-24 ENCOUNTER — Encounter: Payer: Self-pay | Admitting: Pharmacist Clinician (PhC)/ Clinical Pharmacy Specialist

## 2023-06-24 ENCOUNTER — Ambulatory Visit: Payer: PPO | Attending: Cardiology | Admitting: Pharmacist Clinician (PhC)/ Clinical Pharmacy Specialist

## 2023-06-24 DIAGNOSIS — E785 Hyperlipidemia, unspecified: Secondary | ICD-10-CM | POA: Diagnosis not present

## 2023-06-24 DIAGNOSIS — E1169 Type 2 diabetes mellitus with other specified complication: Secondary | ICD-10-CM

## 2023-06-24 NOTE — Assessment & Plan Note (Signed)
Patient has not met goal of at least 5% of body weight loss with comprehensive lifestyle modifications alone in the past 3-6 months. Pharmacotherapy is appropriate to pursue as augmentation. Would like to start Ozempic or Mounjaro.   Confirmed patient not pregnant and no personal or family history of medullary thyroid carcinoma (MTC) or Multiple Endocrine Neoplasia syndrome type 2 (MEN 2).   Advised patient on common side effects including nausea, diarrhea, dyspepsia, decreased appetite, and fatigue. Counseled patient on reducing meal size and how to titrate medication to minimize side effects. Patient aware to call if intolerable side effects or if experiencing dehydration, abdominal pain, or dizziness. Patient will adhere to dietary modifications and will target at least 150 minutes of moderate intensity exercise weekly.   Patient had been on Ozempic earlier this year for about 4 weeks.  Was unable to afford after that.    Her insurance plan has $0 deductible and initial cost, but when gets to coverage gap, price goes to approx $260/month for either Ozempic or Mounjaro.   Unfortunately with her medication list, she spends much of the year in the coverage gap.

## 2023-06-24 NOTE — Progress Notes (Unsigned)
Office Visit    Patient Name: [name] Date of Encounter: 06/27/2023  Primary Care Provider:  Cleatis Polka., MD Primary Cardiologist:  None  Chief Complaint    Weight management  Significant Past Medical History   RV failure Suspect 2/2 parechymal lung disease, on torsemide, spironolactone; d/c dapagliflozin 2/2 autoimmune DM  COPD Emphysema, quit smoking 07/2022, did have weight gain  AF Paroxysmal - CHADS2VASc = 5,on Eliquis  DM1 autoimmune       Allergies  Allergen Reactions   Tramadol     Per Dr. Threasa Beards is renal failure, patient states that she is currently taking this medication and she has not had a reaction to Tramadol since this time.    History of Present Illness    [name] is a 62 y.o. female patient of Dr Shirlee Latch, in the office today to discuss options for weight management.  She has insulin dependent DM, and tried Ozempic earlier in the year.  She did okay with it, but was only able to take for a month, as it became cost prohibitive rather quickly.  Her plan has $0 copay to start, but in the coverage gap is $250+ per month.    On Tresiba (200 U/ml) - 44 U daily   Current weight management medications:   Previously tried meds:  Ozempic - stopped 2/2 elevated copay  Current meds that may affect weight:   Baseline weight/BMI: 98.2 kg // 36.01  Insurance payor:  [employer]  Diet: mix of home and eating out.  Eating out does include some fast foods and fried foods; home is beef or chicken for protein, canned vegetables  Exercise: none  Family History: no known heart disease in family, mother had DM, cancer, sister had cancer  Confirmed patient not pregnant and no personal or family history of medullary thyroid carcinoma (MTC) or Multiple Endocrine Neoplasia syndrome type 2 (MEN 2).   Social History:   Tobacco: former smoker, quit 2010  Alcohol: no   Accessory Clinical Findings    Lab Results   Component Value Date   CREATININE 1.14 (H) 05/26/2023   BUN 35 (H) 05/26/2023   NA 138 05/26/2023   K 4.0 05/26/2023   CL 102 05/26/2023   CO2 23 05/26/2023   Lab Results  Component Value Date   ALT 9 07/24/2022   AST 38 07/24/2022   ALKPHOS 141 (H) 07/24/2022   BILITOT 1.9 (H) 07/24/2022   Lab Results  Component Value Date   HGBA1C 6.2 (H) 07/23/2022    Home Medications/Allergies    Current Outpatient Medications  Medication Sig Dispense Refill   acetaminophen (TYLENOL) 500 MG tablet Take 500 mg by mouth every 6 (six) hours as needed.     apixaban (ELIQUIS) 5 MG TABS tablet Take 1 tablet (5 mg total) by mouth 2 (two) times daily. 60 tablet 0   ARIPiprazole (ABILIFY) 5 MG tablet Take 1 tablet (5 mg total) by mouth daily. 30 tablet 2   atorvastatin (LIPITOR) 40 MG tablet Take 40 mg by mouth at bedtime.      bisoprolol (ZEBETA) 5 MG tablet Take 1 tablet (5 mg total) by mouth daily. 90 tablet 3   budesonide (PULMICORT) 0.25 MG/2ML nebulizer solution Use 1 vial (0.25 mg total) by nebulization 2 (two) times daily. 120 mL 0   clonazePAM (KLONOPIN) 0.25 MG disintegrating tablet Take 1 tablet (0.25 mg total) by mouth 2 (two) times daily as needed (anxiety). 60 tablet  2   dicyclomine (BENTYL) 10 MG capsule Take 10 mg by mouth every 8 (eight) hours as needed for spasms (abdominal spasms).     doxepin (SINEQUAN) 100 MG capsule Take 1 capsule (100 mg total) by mouth at bedtime. 30 capsule 2   FLUoxetine (PROZAC) 40 MG capsule TAKE 1 CAPSULE BY MOUTH EVERY DAY 30 capsule 2   insulin degludec (TRESIBA FLEXTOUCH) 200 UNIT/ML FlexTouch Pen inject 44 units Subcutaneous once a day for 30 days     ipratropium-albuterol (DUONEB) 0.5-2.5 (3) MG/3ML SOLN Use 1 vial by nebulization every 6 (six) hours as needed. 360 mL 0   omeprazole (PRILOSEC) 40 MG capsule Take 40 mg by mouth daily before breakfast.   6   ONETOUCH VERIO test strip CHECK SUGARS TWICE A DAY DX  DM2, INSULIN DEPENDANT      Semaglutide,0.25 or 0.5MG /DOS, (OZEMPIC, 0.25 OR 0.5 MG/DOSE,) 2 MG/3ML SOPN Inject 1 Units into the skin once a week. (Patient not taking: Reported on 05/26/2023)     spironolactone (ALDACTONE) 25 MG tablet Take 0.5 tablets (12.5 mg total) by mouth daily. 30 tablet 0   torsemide (DEMADEX) 20 MG tablet Take 1 tablet (20 mg total) by mouth daily. 30 tablet 0   Treprostinil (TYVASO DPI MAINTENANCE KIT) 64 MCG POWD Inhale 64 Inhalations into the lungs in the morning, at noon, in the evening, and at bedtime.     VENTOLIN HFA 108 (90 BASE) MCG/ACT inhaler Inhale 2 puffs into the lungs every 6 (six) hours as needed for wheezing or shortness of breath.      No current facility-administered medications for this visit.     Allergies  Allergen Reactions   Tramadol     Per Dr. Threasa Beards is renal failure, patient states that she is currently taking this medication and she has not had a reaction to Tramadol since this time.    Assessment & Plan    Type 2 diabetes mellitus with hyperlipidemia (HCC) Patient has not met goal of at least 5% of body weight loss with comprehensive lifestyle modifications alone in the past 3-6 months. Pharmacotherapy is appropriate to pursue as augmentation. Would like to start Ozempic or Mounjaro.   Confirmed patient not pregnant and no personal or family history of medullary thyroid carcinoma (MTC) or Multiple Endocrine Neoplasia syndrome type 2 (MEN 2).   Advised patient on common side effects including nausea, diarrhea, dyspepsia, decreased appetite, and fatigue. Counseled patient on reducing meal size and how to titrate medication to minimize side effects. Patient aware to call if intolerable side effects or if experiencing dehydration, abdominal pain, or dizziness. Patient will adhere to dietary modifications and will target at least 150 minutes of moderate intensity exercise weekly.   Patient had been on Ozempic earlier this year for about 4 weeks.  Was unable to  afford after that.    Her insurance plan has $0 deductible and initial cost, but when gets to coverage gap, price goes to approx $260/month for either Ozempic or Mounjaro.   Unfortunately with her medication list, she spends much of the year in the coverage gap.     Phillips Hay PharmD CPP [facility]  [address] [place], [place] [address] [phone]

## 2023-06-24 NOTE — Patient Instructions (Signed)
We will start the prior authorization process to get Ozempic or Mounjaro covered by your insurance.   TIPS FOR SUCCESS Write down the reasons why you want to lose weight and post it in a place where you'll see it often. Start small and work your way up. Keep in mind that it takes time to achieve goals, and small steps add up. Any additional movements help to burn calories. Taking the stairs rather than the elevator and parking at the far end of your parking lot are easy ways to start. Brisk walking for at least 30 minutes 4 or more days of the week is an excellent goal to work toward  [employer] WHAT IT MEANS TO FEEL FULL Did you know that it can take 15 minutes or more for your brain to receive the message that you've eaten? That means that, if you eat less food, but consume it slower, you may still feel satisfied. Eating a lot of fruits and vegetables can also help you feel fuller. Eat off of smaller plates so that moderate portions don't seem too small  TITRATION PLAN Will plan to follow the titration plan as below, pending patient is tolerating each dose before increasing to the next. Can slow titration if needed for tolerability.   We will go over this information once we determine which medication is best covered by your plan     Follow up in 3 months after starting weight loss medication.  If you have any questions or concerns, please reach out to [place].  Alek Poncedeleon/Chris at [phone].  THANK YOU FOR CHOOSING [facility]

## 2023-06-25 DIAGNOSIS — J449 Chronic obstructive pulmonary disease, unspecified: Secondary | ICD-10-CM | POA: Diagnosis not present

## 2023-06-27 ENCOUNTER — Telehealth: Payer: Self-pay | Admitting: Pharmacist Clinician (PhC)/ Clinical Pharmacy Specialist

## 2023-06-27 NOTE — Telephone Encounter (Signed)
Can you please run claim for Ozempic and Mounjaro to see what her copay would be?

## 2023-06-28 ENCOUNTER — Telehealth: Payer: Self-pay | Admitting: Pharmacy Technician

## 2023-06-28 ENCOUNTER — Other Ambulatory Visit (HOSPITAL_COMMUNITY): Payer: Self-pay

## 2023-06-28 NOTE — Telephone Encounter (Signed)
Pharmacy Patient Advocate Encounter  Insurance verification completed.    The patient is insured through [facility] ADVANTAGE/RX ADVANCE   Ran test claim for ozempic. Currently a quantity of 3 ML is a 28 day supply and the co-pay is $0.00 - no copay .  Ran test claim for monjaro. Currently a quantity of 2 ML is a 28 day supply and requires Prior Auth.  This test claim was processed through [facility]- copay amounts may vary at other pharmacies due to pharmacy/plan contracts, or as the patient moves through the different stages of their insurance plan.

## 2023-06-29 DIAGNOSIS — I10 Essential (primary) hypertension: Secondary | ICD-10-CM | POA: Diagnosis not present

## 2023-06-29 DIAGNOSIS — E041 Nontoxic single thyroid nodule: Secondary | ICD-10-CM | POA: Diagnosis not present

## 2023-06-29 DIAGNOSIS — I130 Hypertensive heart and chronic kidney disease with heart failure and stage 1 through stage 4 chronic kidney disease, or unspecified chronic kidney disease: Secondary | ICD-10-CM | POA: Diagnosis not present

## 2023-06-29 DIAGNOSIS — Z0189 Encounter for other specified special examinations: Secondary | ICD-10-CM | POA: Diagnosis not present

## 2023-06-29 DIAGNOSIS — N183 Chronic kidney disease, stage 3 unspecified: Secondary | ICD-10-CM | POA: Diagnosis not present

## 2023-06-29 DIAGNOSIS — E785 Hyperlipidemia, unspecified: Secondary | ICD-10-CM | POA: Diagnosis not present

## 2023-06-29 DIAGNOSIS — J439 Emphysema, unspecified: Secondary | ICD-10-CM | POA: Diagnosis not present

## 2023-06-29 DIAGNOSIS — Z0000 Encounter for general adult medical examination without abnormal findings: Secondary | ICD-10-CM | POA: Diagnosis not present

## 2023-06-29 NOTE — Telephone Encounter (Signed)
LMOM for patient to return call.  Test claim for Ozempic came back with $0 copay.

## 2023-07-05 DIAGNOSIS — E119 Type 2 diabetes mellitus without complications: Secondary | ICD-10-CM | POA: Diagnosis not present

## 2023-07-05 DIAGNOSIS — Z794 Long term (current) use of insulin: Secondary | ICD-10-CM | POA: Diagnosis not present

## 2023-07-05 DIAGNOSIS — J449 Chronic obstructive pulmonary disease, unspecified: Secondary | ICD-10-CM | POA: Diagnosis not present

## 2023-07-05 DIAGNOSIS — Z6837 Body mass index (BMI) 37.0-37.9, adult: Secondary | ICD-10-CM | POA: Diagnosis not present

## 2023-07-05 DIAGNOSIS — Z515 Encounter for palliative care: Secondary | ICD-10-CM | POA: Diagnosis not present

## 2023-07-06 ENCOUNTER — Telehealth (HOSPITAL_COMMUNITY): Payer: PPO | Admitting: Psychiatry

## 2023-07-06 DIAGNOSIS — Z1331 Encounter for screening for depression: Secondary | ICD-10-CM | POA: Diagnosis not present

## 2023-07-06 DIAGNOSIS — I10 Essential (primary) hypertension: Secondary | ICD-10-CM | POA: Diagnosis not present

## 2023-07-06 DIAGNOSIS — F319 Bipolar disorder, unspecified: Secondary | ICD-10-CM | POA: Diagnosis not present

## 2023-07-06 DIAGNOSIS — Z1389 Encounter for screening for other disorder: Secondary | ICD-10-CM | POA: Diagnosis not present

## 2023-07-06 DIAGNOSIS — I2781 Cor pulmonale (chronic): Secondary | ICD-10-CM | POA: Diagnosis not present

## 2023-07-06 DIAGNOSIS — E041 Nontoxic single thyroid nodule: Secondary | ICD-10-CM | POA: Diagnosis not present

## 2023-07-06 DIAGNOSIS — M25512 Pain in left shoulder: Secondary | ICD-10-CM | POA: Diagnosis not present

## 2023-07-06 DIAGNOSIS — J449 Chronic obstructive pulmonary disease, unspecified: Secondary | ICD-10-CM | POA: Diagnosis not present

## 2023-07-06 DIAGNOSIS — Z0000 Encounter for general adult medical examination without abnormal findings: Secondary | ICD-10-CM | POA: Diagnosis not present

## 2023-07-06 DIAGNOSIS — M25511 Pain in right shoulder: Secondary | ICD-10-CM | POA: Diagnosis not present

## 2023-07-06 DIAGNOSIS — Z23 Encounter for immunization: Secondary | ICD-10-CM | POA: Diagnosis not present

## 2023-07-06 DIAGNOSIS — Z1339 Encounter for screening examination for other mental health and behavioral disorders: Secondary | ICD-10-CM | POA: Diagnosis not present

## 2023-07-06 DIAGNOSIS — I700 Atherosclerosis of aorta: Secondary | ICD-10-CM | POA: Diagnosis not present

## 2023-07-06 DIAGNOSIS — I480 Paroxysmal atrial fibrillation: Secondary | ICD-10-CM | POA: Diagnosis not present

## 2023-07-06 DIAGNOSIS — R82998 Other abnormal findings in urine: Secondary | ICD-10-CM | POA: Diagnosis not present

## 2023-07-06 DIAGNOSIS — D6869 Other thrombophilia: Secondary | ICD-10-CM | POA: Diagnosis not present

## 2023-07-06 DIAGNOSIS — E139 Other specified diabetes mellitus without complications: Secondary | ICD-10-CM | POA: Diagnosis not present

## 2023-07-07 ENCOUNTER — Other Ambulatory Visit: Payer: Self-pay | Admitting: Internal Medicine

## 2023-07-07 DIAGNOSIS — E041 Nontoxic single thyroid nodule: Secondary | ICD-10-CM

## 2023-07-11 ENCOUNTER — Other Ambulatory Visit (HOSPITAL_COMMUNITY): Payer: Self-pay | Admitting: Psychiatry

## 2023-07-11 ENCOUNTER — Ambulatory Visit
Admission: RE | Admit: 2023-07-11 | Discharge: 2023-07-11 | Disposition: A | Discharge status: Home / Self Care | Payer: PPO | Source: Ambulatory Visit | Attending: Internal Medicine | Admitting: Internal Medicine

## 2023-07-11 DIAGNOSIS — F411 Generalized anxiety disorder: Secondary | ICD-10-CM

## 2023-07-11 DIAGNOSIS — E041 Nontoxic single thyroid nodule: Secondary | ICD-10-CM

## 2023-07-11 DIAGNOSIS — F331 Major depressive disorder, recurrent, moderate: Secondary | ICD-10-CM

## 2023-07-11 DIAGNOSIS — F4310 Post-traumatic stress disorder, unspecified: Secondary | ICD-10-CM

## 2023-07-26 DIAGNOSIS — J449 Chronic obstructive pulmonary disease, unspecified: Secondary | ICD-10-CM | POA: Diagnosis not present

## 2023-07-27 ENCOUNTER — Other Ambulatory Visit (HOSPITAL_COMMUNITY): Payer: Self-pay | Admitting: Psychiatry

## 2023-07-27 DIAGNOSIS — F331 Major depressive disorder, recurrent, moderate: Secondary | ICD-10-CM

## 2023-07-27 DIAGNOSIS — E139 Other specified diabetes mellitus without complications: Secondary | ICD-10-CM | POA: Diagnosis not present

## 2023-07-27 DIAGNOSIS — M353 Polymyalgia rheumatica: Secondary | ICD-10-CM | POA: Diagnosis not present

## 2023-07-27 DIAGNOSIS — F411 Generalized anxiety disorder: Secondary | ICD-10-CM

## 2023-07-27 DIAGNOSIS — F4310 Post-traumatic stress disorder, unspecified: Secondary | ICD-10-CM

## 2023-08-10 ENCOUNTER — Encounter (HOSPITAL_COMMUNITY): Payer: Self-pay | Admitting: Psychiatry

## 2023-08-10 ENCOUNTER — Telehealth (HOSPITAL_BASED_OUTPATIENT_CLINIC_OR_DEPARTMENT_OTHER): Payer: PPO | Admitting: Psychiatry

## 2023-08-10 VITALS — Wt 225.0 lb

## 2023-08-10 DIAGNOSIS — F411 Generalized anxiety disorder: Secondary | ICD-10-CM

## 2023-08-10 DIAGNOSIS — F4310 Post-traumatic stress disorder, unspecified: Secondary | ICD-10-CM

## 2023-08-10 DIAGNOSIS — F331 Major depressive disorder, recurrent, moderate: Secondary | ICD-10-CM | POA: Diagnosis not present

## 2023-08-10 MED ORDER — DOXEPIN HCL 100 MG PO CAPS: 100.0000 mg | ORAL_CAPSULE | Freq: Every day | ORAL | 2 refills | Status: DC

## 2023-08-10 MED ORDER — ZIPRASIDONE HCL 20 MG PO CAPS: 20.0000 mg | ORAL_CAPSULE | Freq: Every day | ORAL | 2 refills | Status: DC

## 2023-08-10 MED ORDER — FLUOXETINE HCL 40 MG PO CAPS: ORAL_CAPSULE | ORAL | 2 refills | Status: DC

## 2023-08-10 MED ORDER — CLONAZEPAM 0.25 MG PO TBDP: 0.2500 mg | ORAL_TABLET | Freq: Two times a day (BID) | ORAL | 2 refills | Status: DC | PRN
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
## 2023-08-16 DIAGNOSIS — E785 Hyperlipidemia, unspecified: Secondary | ICD-10-CM | POA: Diagnosis not present

## 2023-08-16 DIAGNOSIS — I130 Hypertensive heart and chronic kidney disease with heart failure and stage 1 through stage 4 chronic kidney disease, or unspecified chronic kidney disease: Secondary | ICD-10-CM | POA: Diagnosis not present

## 2023-08-16 DIAGNOSIS — N1832 Chronic kidney disease, stage 3b: Secondary | ICD-10-CM | POA: Diagnosis not present

## 2023-08-16 DIAGNOSIS — E139 Other specified diabetes mellitus without complications: Secondary | ICD-10-CM | POA: Diagnosis not present

## 2023-08-16 DIAGNOSIS — Z794 Long term (current) use of insulin: Secondary | ICD-10-CM | POA: Diagnosis not present

## 2023-08-25 ENCOUNTER — Other Ambulatory Visit (HOSPITAL_COMMUNITY): Payer: Self-pay | Admitting: Adult Health

## 2023-08-25 DIAGNOSIS — J449 Chronic obstructive pulmonary disease, unspecified: Secondary | ICD-10-CM | POA: Diagnosis not present

## 2023-08-29 ENCOUNTER — Ambulatory Visit: Payer: PPO | Admitting: Podiatry

## 2023-09-01 ENCOUNTER — Other Ambulatory Visit (HOSPITAL_COMMUNITY): Payer: Self-pay | Admitting: Psychiatry

## 2023-09-01 DIAGNOSIS — F331 Major depressive disorder, recurrent, moderate: Secondary | ICD-10-CM

## 2023-09-01 DIAGNOSIS — F4310 Post-traumatic stress disorder, unspecified: Secondary | ICD-10-CM

## 2023-09-01 DIAGNOSIS — F411 Generalized anxiety disorder: Secondary | ICD-10-CM

## 2023-09-06 ENCOUNTER — Encounter (HOSPITAL_COMMUNITY): Payer: Self-pay | Admitting: Cardiology

## 2023-09-06 ENCOUNTER — Ambulatory Visit (HOSPITAL_COMMUNITY)
Admission: RE | Admit: 2023-09-06 | Discharge: 2023-09-06 | Disposition: A | Discharge status: Home / Self Care | Payer: PPO | Source: Ambulatory Visit | Attending: Cardiology | Admitting: Cardiology

## 2023-09-06 ENCOUNTER — Other Ambulatory Visit (HOSPITAL_COMMUNITY): Payer: Self-pay

## 2023-09-06 ENCOUNTER — Telehealth (HOSPITAL_COMMUNITY): Payer: Self-pay | Admitting: Pharmacist

## 2023-09-06 VITALS — BP 100/60 | HR 99 | Wt 215.0 lb

## 2023-09-06 DIAGNOSIS — I5032 Chronic diastolic (congestive) heart failure: Secondary | ICD-10-CM

## 2023-09-06 DIAGNOSIS — I5021 Acute systolic (congestive) heart failure: Secondary | ICD-10-CM

## 2023-09-06 LAB — CBC
HCT: 40.4 % (ref 36.0–46.0)
Hemoglobin: 13.3 g/dL (ref 12.0–15.0)
MCH: 28.4 pg (ref 26.0–34.0)
MCHC: 32.9 g/dL (ref 30.0–36.0)
MCV: 86.1 fL (ref 80.0–100.0)
Platelets: 175 10*3/uL (ref 150–400)
RBC: 4.69 MIL/uL (ref 3.87–5.11)
RDW: 13.6 % (ref 11.5–15.5)
WBC: 13.4 10*3/uL — ABNORMAL HIGH (ref 4.0–10.5)
nRBC: 0 % (ref 0.0–0.2)

## 2023-09-06 LAB — BASIC METABOLIC PANEL
Anion gap: 16 — ABNORMAL HIGH (ref 5–15)
BUN: 43 mg/dL — ABNORMAL HIGH (ref 8–23)
CO2: 25 mmol/L (ref 22–32)
Calcium: 9.6 mg/dL (ref 8.9–10.3)
Chloride: 91 mmol/L — ABNORMAL LOW (ref 98–111)
Creatinine, Ser: 1.43 mg/dL — ABNORMAL HIGH (ref 0.44–1.00)
GFR, Estimated: 41 mL/min — ABNORMAL LOW (ref >60)
Glucose, Bld: 494 mg/dL — ABNORMAL HIGH (ref 70–99)
Potassium: 4.3 mmol/L (ref 3.5–5.1)
Sodium: 132 mmol/L — ABNORMAL LOW (ref 135–145)

## 2023-09-06 LAB — BRAIN NATRIURETIC PEPTIDE: B Natriuretic Peptide: 76.9 pg/mL (ref 0.0–100.0)

## 2023-09-06 NOTE — Progress Notes (Signed)
  Echocardiogram 2D Echocardiogram has been performed.  Ocie Doyne RDCS 09/06/2023, 1:35 PM

## 2023-09-06 NOTE — Patient Instructions (Signed)
STAY off Tyvaso.  Labs done today, your results will be available in MyChart, we will contact you for abnormal readings.  Your physician recommends that you schedule a follow-up appointment in: 6 months   If you have any questions or concerns before your next appointment please send [place] a message through [place] or call our office at [phone].    TO LEAVE A MESSAGE FOR THE NURSE SELECT OPTION 2, PLEASE LEAVE A MESSAGE INCLUDING: YOUR NAME DATE OF BIRTH CALL BACK NUMBER REASON FOR CALL**this is important as we prioritize the call backs  YOU WILL RECEIVE A CALL BACK THE SAME DAY AS LONG AS YOU CALL BEFORE 4:00 PM  At the [facility], you and your health needs are our priority. As part of our continuing mission to provide you with exceptional heart care, we have created designated Provider Care Teams. These Care Teams include your primary Cardiologist (physician) and Advanced Practice Providers (APPs- Physician Assistants and Nurse Practitioners) who all work together to provide you with the care you need, when you need it.   You may see any of the following providers on your designated Care Team at your next follow up: Dr Arvilla Meres Dr Marca Ancona Dr. Dorthula Nettles Dr. Clearnce Hasten Amy Filbert Schilder, NP Robbie Lis, [place] [facility] [place], [place] Brynda Peon, NP [place] Lee, NP Karle Plumber, PharmD   Please be sure to bring in all your medications bottles to every appointment.    Thank you for choosing [facility]

## 2023-09-06 NOTE — Telephone Encounter (Signed)
Advanced [facility] Failure Patient Advocate Encounter  Prior Authorization for Tyvaso DPI has been approved.    Effective dates: 09/06/23 through 09/19/24  Karle Plumber, PharmD, BCPS, BCCP, CPP [facility] Clinic Pharmacist [phone]

## 2023-09-06 NOTE — Telephone Encounter (Signed)
Patient Advocate Encounter   Received notification from [employer] that prior authorization for Tyvaso DPI is required.   PA submitted on CoverMyMeds Key [id number] Status is pending   Will continue to follow.  Karle Plumber, PharmD, BCPS, BCCP, CPP [facility] Clinic Pharmacist [phone]

## 2023-09-07 LAB — ECHOCARDIOGRAM COMPLETE
AR max vel: 1.92 cm2
AV Area VTI: 2.01 cm2
AV Area mean vel: 1.67 cm2
AV Mean grad: 3 mm[Hg]
AV Peak grad: 6.3 mm[Hg]
Ao pk vel: 1.25 m/s
Area-P 1/2: 3.76 cm2
Calc EF: 65 %
MV VTI: 2.31 cm2
S' Lateral: 2.6 cm
Single Plane A2C EF: 65 %
Single Plane A4C EF: 64.4 %

## 2023-09-07 NOTE — Progress Notes (Signed)
Primary Care: Cleatis Polka., MD Pulmonary: Dr Kendrick Fries  HF Cardiologist: Dr. Shirlee Latch  HPI: 62 y.o. female smoker w/ h/o COPD, HTN, HLD, Type 2 DM, chronic pain syndrome/ fibromyalgia and pulmonary hypertension with RV failure/cor pulmonale.  She was admitted 11/23 with respiratory distress, treated with BiPAP. SCr elevated and unable to get CT chest. Echo showed EF 60-65%, severe RV failure and possible McConnell's sign, RA mod dilated, no MR. Felt respiratory distress related to CHF/RV failure. She was diuresed with IV lasix. RUQ [place] limited due to patient habitus but suggestive of possible liver disease/cirrhosis (coarsened hepatic echotexture). She underwent RHC showing low PCWP and elevated right heart pressures, suggesting cor pulmonale. V/Q scan negative for PE. She had a significant oxygen requirement and lung parenchymal disease felt to be significant. Continued to treat with steroids and oxygen.  GDMT initiated but limited by need for low-dose midodrine. She was discharged home on oxygen, weight 189 lbs.  Zio monitor in 1/24 showed multiple SVT episodes, possible atrial tachycardia, no atrial fibrillation.  Bisoprolol was started.   CT chest in 2/24 showed possible smoking-related ILD (respiratory bronchiolitis).    Echo was done today and reviewed, EF 60-65%, normal RV, IVC normal, unable to estimate PA systolic pressure.   Today she returns for HF follow up. Not using oxygen.  Staying off cigarettes.  She stopped Tyvaso about a month ago during a severe URI.  When she tried to start it back, she developed severe headaches and worsening flu-like symptoms, so she stayed off it.  She does not feel any worse off Tyvaso.  In fact, she has improved a lot since quitting cigarettes.  No exertional dyspnea or chest pain.  No orthopnea/PND.  No palpitations.  Weight down 1 lb.    ECG (personally reviewed): NSR, normal  Labs (11/23): K 4.0, creatinine 1.08 Labs (12/23): K 3.5,  creatinine 0.99 Labs (3/24): K 4.0, creatinine 1.46 Labs (6/24): K 4.3, creatinine 1.13 Labs (9/24): BNP 33, K 4, creatinine 1.14  6 minute walk (3/24): 244 m 6 minute walk (9/24): 305 m  PMH: 1. COPD: Prior smoking.  2. 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   - CT chest (2/24) with suspected respiratory bronchiolitis-related ILD.  3. OSA: Cannot tolerate CPAP.  4. Atrial fibrillation: Paroxysmal.  5. Atrial tachycardia: Zio monitor in 1/24 showed multiple SVT episodes, possible atrial tachycardia, no atrial fibrillation.  6. RV failiure/cor pulmonale:  - Echo (11/23): EF 60-65%, grade I DD,  RV severely reduced systolic function.  - RHC (11/23): RA mean 11, PA 44/19 mean 28, PCWP mean 4, CO/CI (Fick) 5.46/2.65, PVR 4.4, PAPi 2.3  - V/Q scan (11/23): No evidence for chronic PE.  - Echo (12/24): EF 60-65%, normal RV, IVC normal, unable to estimate PA systolic pressure.  7. Cirrhosis: Possibly due to RV failure.  8. 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 HTN 10. Hyperlipidemia 11. Fibromyalgia 12. H/o endometrial cancer 13. H/o nephrolithiasis  Current Outpatient Medications  Medication Sig Dispense Refill   acetaminophen (TYLENOL) 500 MG tablet Take 500 mg by mouth every 6 (six) hours as needed.     apixaban (ELIQUIS) 5 MG TABS tablet Take 1 tablet (5 mg total) by mouth 2 (two) times daily. 60 tablet 0   atorvastatin (LIPITOR) 40 MG tablet Take 40 mg by mouth at bedtime.      bisoprolol (ZEBETA) 5 MG tablet TAKE 1/2 TABLET BY MOUTH DAILY 45 tablet 2   clonazePAM (KLONOPIN)  0.25 MG disintegrating tablet Take 1 tablet (0.25 mg total) by mouth 2 (two) times daily as needed (anxiety). 60 tablet 2   dicyclomine (BENTYL) 10 MG capsule Take 10 mg by mouth every 8 (eight) hours as needed for spasms (abdominal spasms).     doxepin (SINEQUAN) 100 MG capsule Take 1 capsule (100 mg total) by mouth at bedtime. 30 capsule 2   FLUoxetine (PROZAC) 40 MG capsule TAKE  1 CAPSULE BY MOUTH EVERY DAY 30 capsule 2   Insulin Degludec (TRESIBA Durand) Inject 68 Units into the skin daily.     ipratropium-albuterol (DUONEB) 0.5-2.5 (3) MG/3ML SOLN Use 1 vial by nebulization every 6 (six) hours as needed. 360 mL 0   omeprazole (PRILOSEC) 40 MG capsule Take 40 mg by mouth daily before breakfast.   6   ONETOUCH VERIO test strip CHECK SUGARS TWICE A DAY DX  DM2, INSULIN DEPENDANT     spironolactone (ALDACTONE) 25 MG tablet Take 0.5 tablets (12.5 mg total) by mouth daily. 30 tablet 0   torsemide (DEMADEX) 20 MG tablet Take 1 tablet (20 mg total) by mouth daily. 30 tablet 0   VENTOLIN HFA 108 (90 BASE) MCG/ACT inhaler Inhale 2 puffs into the lungs every 6 (six) hours as needed for wheezing or shortness of breath.      ziprasidone (GEODON) 20 MG capsule Take 1 capsule (20 mg total) by mouth daily after breakfast. 30 capsule 2   Semaglutide,0.25 or 0.5MG /DOS, (OZEMPIC, 0.25 OR 0.5 MG/DOSE,) 2 MG/3ML SOPN Inject 1 Units into the skin once a week. (Patient not taking: Reported on 09/06/2023)     No current facility-administered medications for this encounter.   Allergies  Allergen Reactions   Tramadol     Per Dr. Threasa Beards is renal failure, patient states that she is currently taking this medication and she has not had a reaction to Tramadol since this time.   Social History   Socioeconomic History   Marital status: Married    Spouse name: Not on file   Number of children: Not on file   Years of education: Not on file   Highest education level: Not on file  Occupational History   Not on file  Tobacco Use   Smoking status: Former    Current packs/day: 0.00    Types: Cigarettes    Quit date: 2010    Years since quitting: 14.9   Smokeless tobacco: Never   Tobacco comments:    about one pack a day on a bad day  Vaping Use   Vaping status: Never Used  Substance and Sexual Activity   Alcohol use: No   Drug use: No   Sexual activity: Never    Birth  control/protection: None  Other Topics Concern   Not on file  Social History Narrative   Not on file   Social Drivers of Health   Financial Resource Strain: Not on file  Food Insecurity: No Food Insecurity (07/25/2022)   Hunger Vital Sign    Worried About Running Out of Food in the Last Year: Never true    Ran Out of Food in the Last Year: Never true  Transportation Needs: No Transportation Needs (07/25/2022)   PRAPARE - Administrator, Civil Service (Medical): No    Lack of Transportation (Non-Medical): No  Physical Activity: Not on file  Stress: Not on file  Social Connections: Not on file  Intimate Partner Violence: Not At Risk (07/25/2022)   Humiliation, Afraid, Rape, and Kick questionnaire  Fear of Current or Ex-Partner: No    Emotionally Abused: No    Physically Abused: No    Sexually Abused: No   Family History  Problem Relation Age of Onset   Diabetes Mother    Throat cancer Mother    Uterine cancer Sister    Deep vein thrombosis Neg Hx    Pulmonary embolism Neg Hx    ROS: All systems reviewed and negative except as per HPI.  BP 100/60   Pulse 99   Wt 97.5 kg (215 lb)   LMP 04/20/2013   SpO2 96%   BMI 35.78 kg/m   Wt Readings from Last 3 Encounters:  09/06/23 97.5 kg (215 lb)  05/26/23 98.2 kg (216 lb 6.4 oz)  02/22/23 91.8 kg (202 lb 6.4 oz)   PHYSICAL EXAM: General: NAD Neck: No JVD, no thyromegaly or thyroid nodule.  Lungs: Clear to auscultation bilaterally with normal respiratory effort. CV: Nondisplaced PMI.  Heart regular S1/S2, no S3/S4, no murmur.  No peripheral edema.  No carotid bruit.  Normal pedal pulses.  Abdomen: Soft, nontender, no hepatosplenomegaly, no distention.  Skin: Intact without lesions or rashes.  Neurologic: Alert and oriented x 3.  Psych: Normal affect. Extremities: No clubbing or cyanosis.  HEENT: Normal.   ASSESSMENT & PLAN: 1. RV failure/cor pulmonale: Echo 11/23 showed EF 60-65%, D-shaped septum, moderate  RV enlargement with severely decreased RV systolic function, IVC dilated. RHC with low PCWP, elevated right-sided filling pressures, mild PAH, preserved cardiac output. Suspect RV failure due to parenchymal lung disease (emphysema, smoking-related ILD noted on HRCT 4/23). She is much improved symptomatically, NYHA class I-II and off oxygen.  Echo today showed EF 60-65%, normal RV, IVC normal, unable to estimate PA systolic pressure.  Last BNP was normal.  I suspect respiratory bronchiolitis has improved now that she has quit smoking.  She is off Tyvaso and is not feeling any worse off it. She is not volume overloaded on exam.  - She does not appear to need a diuretic.  - Continue spironolactone 12.5 daily.  - Now off Farxiga per PCP over concern for autoimmune DM and increased risk of DKA. 2. 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  CT chests have shown emphysema and smoking-related respiratory bronchiolitis with ILD.  V/Q scan in 11/23 did not show evidence for PE. She started on Tyvaso for PH-ILD.  However, she is now off Tyvaso and feels no worse off it.  RV looked improved on today's echo.  Respiratory bronchiolitis may have significantly improved off smoking.  - I think she can stay off Tyvaso.  3. Chronic hypoxemic respiratory failure: Baseline respiratory bronchiolitis-ILD + COPD.  Now off oxygen, much improved. - Follows with Pulmonary. 4. COPD: Emphysema on chest imaging.  Quit smoking 11/23.  5. Atrial fibrillation: Paroxysmal. NSR  today.   - Continue Eliquis.   - Continue bisoprolol 2.5 mg daily.  6. Cirrhosis: Possible cirrhosis by RUQ [place].  This may be due to RV failure.  7. Obesity: Unable to get GLP-1 agonist.   Follow up in 6 months with APP.   Marca Ancona   09/07/2023

## 2023-09-09 ENCOUNTER — Telehealth (HOSPITAL_COMMUNITY): Payer: Self-pay | Admitting: *Deleted

## 2023-09-09 NOTE — Telephone Encounter (Signed)
Called patient per Dr. Shirlee Latch with following lab results and instructions:  "Glucose is high, this may be why she was feeling bad this afternoon.  She needs to contact PCP."  Pt verbalized understanding and agreement to same. She reports she is on steroids and that is why it was high that day. She is monitoring home glucoses.

## 2023-09-16 ENCOUNTER — Ambulatory Visit: Payer: PPO | Attending: Internal Medicine | Admitting: Internal Medicine

## 2023-09-16 ENCOUNTER — Ambulatory Visit (INDEPENDENT_AMBULATORY_CARE_PROVIDER_SITE_OTHER): Payer: PPO

## 2023-09-16 ENCOUNTER — Ambulatory Visit: Payer: PPO

## 2023-09-16 ENCOUNTER — Encounter: Payer: Self-pay | Admitting: Internal Medicine

## 2023-09-16 VITALS — BP 113/72 | HR 101 | Resp 12 | Ht 63.0 in | Wt 214.0 lb

## 2023-09-16 DIAGNOSIS — M25512 Pain in left shoulder: Secondary | ICD-10-CM

## 2023-09-16 DIAGNOSIS — G8929 Other chronic pain: Secondary | ICD-10-CM

## 2023-09-16 DIAGNOSIS — M797 Fibromyalgia: Secondary | ICD-10-CM

## 2023-09-16 DIAGNOSIS — M25511 Pain in right shoulder: Secondary | ICD-10-CM | POA: Insufficient documentation

## 2023-09-16 DIAGNOSIS — M130 Polyarthritis, unspecified: Secondary | ICD-10-CM | POA: Insufficient documentation

## 2023-09-16 MED ORDER — PREDNISONE 5 MG PO TABS: ORAL_TABLET | ORAL | 0 refills | Status: AC

## 2023-09-16 NOTE — Progress Notes (Signed)
Office Visit Note  Patient: [name]             Date of Birth: Jun 07, 1961           MRN: [id number]             PCP: Cleatis Polka., MD Referring: Cleatis Polka., MD Visit Date: 09/16/2023   Subjective:  New Patient (Initial Visit) (Patient states she is currently taking prednisone. Patient states she has joint pain across her shoulders and in the middle of her back. Patient states she also has pain in her arms and hands but the left is worse. )    Discussed the use of AI scribe software for clinical note transcription with the patient, who gave verbal consent to proceed.  History of Present Illness   [name] is a 62 y.o. female here for evaluation of bilateral joint pain and stiffness, predominantly affecting the shoulders and hands and high sedimentation rate. The symptoms began approximately four months ago, initially presenting as a sensation of a pulled muscle in the shoulder, progressing to the point where the patient was unable to lift their arm. Concurrently, the patient noticed increasing difficulty in making a fist, more so in one hand than the other. The symptoms were described as painful, with no visible swelling or color changes noted. The discomfort and limited mobility were consistent throughout the day, with no relief or worsening at any particular time. The patient denied any lower extremity involvement.  Despite initial advice to continue using the affected joints, the patient's symptoms persisted, leading to a loss of function in the arms and hands. The patient also reported dropping objects and difficulty opening pill bottles. Numbness in the hands was noted during this period, but has since resolved.  The patient was started on prednisone 20 mg following blood work that showed a high sedimentation rate. Improvement in symptoms was noted after approximately three days of steroid therapy, with continued gradual improvement since then. The  patient has been on prednisone consistently for about two months. The main side effect noted was worsening of diabetes control.  She has a history of fibromyalgia but does not feel current symptoms are typical of her chronic muscle or joint pains. The patient has no history of similar episodes, injuries, or surgeries in the neck or shoulders. The patient has not had any imaging studies of the affected joints.   Labs reviewed 06/2023 ESR 65 CK 74  07/2022 RF 24.3 ANA neg  Activities of Daily Living:  Patient reports morning stiffness for 2 hours.   Patient Reports nocturnal pain.  Difficulty dressing/grooming: Denies Difficulty climbing stairs: Reports Difficulty getting out of chair: Reports Difficulty using hands for taps, buttons, cutlery, and/or writing: Reports  Review of Systems  Constitutional:  Positive for fatigue.  HENT:  Positive for mouth dryness. Negative for mouth sores.   Eyes:  Negative for dryness.  Respiratory:  Negative for shortness of breath.   Cardiovascular:  Negative for chest pain and palpitations.  Gastrointestinal:  Negative for blood in stool, constipation and diarrhea.  Endocrine: Positive for increased urination.  Genitourinary:  Negative for involuntary urination.  Musculoskeletal:  Positive for joint pain, joint pain, joint swelling, myalgias, muscle weakness, morning stiffness and myalgias. Negative for gait problem and muscle tenderness.  Skin:  Negative for color change, rash, hair loss and sensitivity to sunlight.  Allergic/Immunologic: Negative for susceptible to infections.  Neurological:  Negative for dizziness and headaches.  Hematological:  Negative for swollen glands.  Psychiatric/Behavioral:  Positive for depressed mood. Negative for sleep disturbance. The patient is nervous/anxious.     PMFS History:  Patient Active Problem List   Diagnosis Date Noted   Polyarthritis 09/16/2023   Bilateral shoulder pain 09/16/2023   Paroxysmal  atrial fibrillation (HCC) 07/27/2022   CHF (congestive heart failure), NYHA class III, acute, systolic (HCC) 07/23/2022   Acute on chronic diastolic CHF (congestive heart failure) (HCC) 07/23/2022   Type 2 diabetes mellitus with hyperlipidemia (HCC) 01/09/2019   Essential hypertension 12/12/2018   Hyperlipidemia 12/12/2018   Preoperative clearance 12/12/2018   Endometrial cancer (HCC) 11/15/2018   Incisional hernia, without obstruction or gangrene 02/04/2014   Post-op pain 07/23/2013   Postoperative wound infection 05/25/2013   Wound disruption, post-op, skin 01/30/2013   Preoperative respiratory examination 12/18/2012   Smoking 12/18/2012   Heme positive stool 11/25/2012   Hypokalemia 11/25/2012   COPD (chronic obstructive pulmonary disease) (HCC)    Acid reflux disease    Post-operative state 11/21/2012   AKI (acute kidney injury) (HCC) 11/04/2012   HAP (hospital-acquired pneumonia) 11/03/2012   Acute pulmonary edema (HCC) 11/03/2012   Acute respiratory failure with hypoxia (HCC) 11/02/2012   Acute exacerbation of COPD with asthma (HCC) 11/02/2012   Parastomal hernia 11/02/2012   Fibromyalgia 11/02/2012   Generalized anxiety disorder 11/02/2012   Restless legs syndrome 11/02/2012   Class 2 obesity 11/02/2012   Leukocytosis 11/02/2012   Hyperglycemia 11/02/2012   Cigarette nicotine dependence with withdrawal 11/02/2012   Complete atelectasis 11/02/2012    Past Medical History:  Diagnosis Date   Acid reflux disease    Anxiety    Asthma    Bronchitis, chronic (HCC)    Effects worse with URI   Chronic pain syndrome    COPD (chronic obstructive pulmonary disease) (HCC)    Depression    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, type 2   Diverticulitis    Dysplasia of cervix (uteri)    patient states she had dysplasia of the uterus stage 4   Endometrial cancer (HCC)    Fibromyalgia    Fibromyalgia    GERD (gastroesophageal reflux disease)     Headache(784.0)    migraine headache   Hyperlipidemia    Hypertension    Kidney stones    at least 6 stones in past   Pneumonia 2019   Restless leg syndrome    Bilateral   Vomiting    last night at 2300 after last round of antibiotic    Family History  Problem Relation Age of Onset   Diabetes Mother    Throat cancer Mother    Uterine cancer Sister    Deep vein thrombosis Neg Hx    Pulmonary embolism Neg Hx    Past Surgical History:  Procedure Laterality Date   ABDOMINAL HYSTERECTOMY     APPENDECTOMY     APPLICATION OF WOUND VAC N/A 11/01/2012   Procedure: APPLICATION OF WOUND VAC;  Surgeon: Clovis Pu. Cornett, MD;  Location: [facility];  Service: General;  Laterality: N/A;   CHOLECYSTECTOMY     COLONOSCOPY  2013   COLOSTOMY     COLOSTOMY CLOSURE N/A 11/01/2012   Procedure: COLOSTOMY CLOSURE;  Surgeon: Clovis Pu. Cornett, MD;  Location: [facility];  Service: General;  Laterality: N/A;  REPAIR PARASTOMAL HERNIA; REVISION OF COLOSTOMY   COLOSTOMY CLOSURE N/A 05/03/2013   Procedure: COLOSTOMY CLOSURE;  Surgeon: Clovis Pu. Cornett, MD;  Location: [facility];  Service: General;  Laterality: N/A;   DILATION AND CURETTAGE OF UTERUS N/A 05/24/2019   Procedure: DILATATION AND CURETTAGE;  Surgeon: Antony Blackbird, MD;  Location: [facility];  Service: Gynecology;  Laterality: N/A;   ESOPHAGOGASTRODUODENOSCOPY Left 11/27/2012   Procedure: ESOPHAGOGASTRODUODENOSCOPY (EGD);  Surgeon: Vertell Novak., MD;  Location: [facility];  Service: Gastroenterology;  Laterality: Left;   INCISIONAL HERNIA REPAIR N/A 11/01/2012   Procedure: HERNIA REPAIR INCISIONAL;  Surgeon: Clovis Pu. Cornett, MD;  Location: [facility];  Service: General;  Laterality: N/A;   LAPAROTOMY  11/01/2012   Procedure: EXPLORATORY LAPAROTOMY;  Surgeon: Clovis Pu. Cornett, MD;  Location: [facility];  Service: General;;  exploratory laparotomy,repair of parastomal hernia and incisional hernia, lysis of adhesions, and application of wound V.A.C.   LYSIS OF  ADHESION N/A 11/01/2012   Procedure: LYSIS OF ADHESION;  Surgeon: Clovis Pu. Cornett, MD;  Location: [facility];  Service: General;  Laterality: N/A;   OPERATIVE ULTRASOUND N/A 06/14/2019   Procedure: OPERATIVE ULTRASOUND;  Surgeon: Antony Blackbird, MD;  Location: [facility];  Service: Urology;  Laterality: N/A;   PARASTOMAL HERNIA REPAIR N/A 11/01/2012   Procedure: HERNIA REPAIR PARASTOMAL;  Surgeon: Clovis Pu. Cornett, MD;  Location: [facility];  Service: General;  Laterality: N/A;   PARTIAL HYSTERECTOMY     partial   RIGHT HEART CATH N/A 07/26/2022   Procedure: RIGHT HEART CATH;  Surgeon: Laurey Morale, MD;  Location: [facility];  Service: Cardiovascular;  Laterality: N/A;   TANDEM RING INSERTION N/A 05/24/2019   Procedure: INSERTION OF HYMEN CAPSULE;  Surgeon: Antony Blackbird, MD;  Location: [facility];  Service: Gynecology;  Laterality: N/A;   TANDEM RING INSERTION N/A 06/14/2019   Procedure: HYMEN CAPSULE PLACEMENT;  Surgeon: Antony Blackbird, MD;  Location: [facility];  Service: Urology;  Laterality: N/A;   Social History   Social History Narrative   Not on file   Immunization History  Administered Date(s) Administered   Influenza,inj,Quad PF,6+ Mos 06/08/2017   Pneumococcal Polysaccharide-23 07/22/2011   Tdap 07/01/2015     Objective: Vital Signs: BP 113/72 (BP Location: Right Arm, Patient Position: Sitting, Cuff Size: Normal)   Pulse (!) 101   Resp 12   Ht 5\' 3"  (1.6 m)   Wt 214 lb (97.1 kg)   LMP 04/20/2013   BMI 37.91 kg/m    Physical Exam Constitutional:      Appearance: She is obese.  Eyes:     Conjunctiva/sclera: Conjunctivae normal.  Cardiovascular:     Rate and Rhythm: Normal rate and regular rhythm.  Pulmonary:     Effort: Pulmonary effort is normal.     Breath sounds: Normal breath sounds.  Lymphadenopathy:     Cervical: No cervical adenopathy.  Skin:    General: Skin is warm and dry.     Findings: Erythema present. No rash.     Comments: Palmar erythema b/l   Neurological:     Mental Status: She is alert.  Psychiatric:        Mood and Affect: Mood normal.      Musculoskeletal Exam:  Shoulders decreased abduction ROM, very anterior shoulder posture Elbows full ROM no tenderness or swelling Wrists full ROM no tenderness or swelling Fingers full ROM no tenderness or swelling, no wrist pain to percussion Widespread muscle tenderness to pressure throughout back and traps, no radiation Knees full ROM no tenderness or swelling  Investigation: No additional findings.  Imaging: XR Shoulder Left Result Date: 09/16/2023 X-ray left shoulder 4 views Glenohumeral joint  space appears preserved.  Narrowing of acromiohumeral distance on external rotated position.  There is AC joint space narrowing.  No visible joint effusion or abnormal calcifications seen.  No acute appearing bony abnormality. Impression Acromiohumeral distance narrowing suggestive for chronic rotator cuff tendinopathy or tear.  Moderate AC joint osteoarthritis.  XR Shoulder Right Result Date: 09/16/2023 X-ray right shoulder 4 views Appears to be mild glenohumeral joint space narrowing without significant osteophytes or increased sclerosis.  Acromiohumeral distance is normal.  Enthesophyte process on humerus head visible externally rotated view.  AC joint space narrowing and bone spurring.  No visible joint effusion or other abnormal calcifications. Impression Enthesophytes at greater tubercle of humerus appears consistent with chronic rotator cuff tendinopathy or enthesopathy, and moderate AC joint osteoarthritis  ECHOCARDIOGRAM COMPLETE Result Date: 09/07/2023    ECHOCARDIOGRAM REPORT   Patient Name:   [name] Date of Exam: 09/06/2023 Medical Rec #:  [id number]          Height:       65.0 in Accession #:    [id number]         Weight:       225.0 lb Date of Birth:  1961/06/05          BSA:          2.080 m Patient Age:    62 years           BP:           126/7 mmHg Patient  Gender: F                  HR:           94 bpm. Exam Location:  Outpatient Procedure: 2D Echo, Cardiac Doppler and Color Doppler Indications:    CHF  History:        Patient has prior history of Echocardiogram examinations, most                 recent 07/23/2022. CHF, COPD, Arrythmias:Atrial Fibrillation;                 Risk Factors:Former Smoker, Diabetes and Dyslipidemia.  Sonographer:    Vern Claude Referring Phys: [phone] DALTON S MCLEAN IMPRESSIONS  1. Left ventricular ejection fraction, by estimation, is 55 to 60%. The left ventricle has normal function. The left ventricle has no regional wall motion abnormalities. There is mild left ventricular hypertrophy. Left ventricular diastolic parameters are indeterminate.  2. Right ventricular systolic function is normal. The right ventricular size is normal. Tricuspid regurgitation signal is inadequate for assessing PA pressure.  3. The mitral valve is normal in structure. No evidence of mitral valve regurgitation. No evidence of mitral stenosis.  4. The aortic valve is grossly normal, review of prior exam demonstrates tricuspid aortic valve. Aortic valve regurgitation is trivial. No aortic stenosis is present.  5. The inferior vena cava is normal in size with greater than 50% respiratory variability, suggesting right atrial pressure of 3 mmHg. FINDINGS  Left Ventricle: Left ventricular ejection fraction, by estimation, is 55 to 60%. The left ventricle has normal function. The left ventricle has no regional wall motion abnormalities. The left ventricular internal cavity size was normal in size. There is  mild left ventricular hypertrophy. Left ventricular diastolic parameters are indeterminate. Right Ventricle: The right ventricular size is normal. No increase in right ventricular wall thickness. Right ventricular systolic function is normal. Tricuspid regurgitation signal is inadequate for assessing PA pressure. Left Atrium: Left atrial size  was normal in size. Right  Atrium: Right atrial size was normal in size. Pericardium: There is no evidence of pericardial effusion. Mitral Valve: The mitral valve is normal in structure. No evidence of mitral valve regurgitation. No evidence of mitral valve stenosis. MV peak gradient, 4.2 mmHg. The mean mitral valve gradient is 2.0 mmHg. Tricuspid Valve: The tricuspid valve is normal in structure. Tricuspid valve regurgitation is trivial. No evidence of tricuspid stenosis. Aortic Valve: The aortic valve is grossly normal. Aortic valve regurgitation is trivial. No aortic stenosis is present. Aortic valve mean gradient measures 3.0 mmHg. Aortic valve peak gradient measures 6.2 mmHg. Aortic valve area, by VTI measures 2.01 cm. Pulmonic Valve: The pulmonic valve was normal in structure. Pulmonic valve regurgitation is not visualized. No evidence of pulmonic stenosis. Aorta: The aortic root is normal in size and structure. Venous: The inferior vena cava is normal in size with greater than 50% respiratory variability, suggesting right atrial pressure of 3 mmHg. IAS/Shunts: The atrial septum is grossly normal.  LEFT VENTRICLE PLAX 2D LVIDd:         3.50 cm      Diastology LVIDs:         2.60 cm      LV e' medial:    6.74 cm/s LV PW:         1.20 cm      LV E/e' medial:  7.5 LV IVS:        1.10 cm      LV e' lateral:   9.25 cm/s LVOT diam:     1.80 cm      LV E/e' lateral: 5.5 LV SV:         36 LV SV Index:   17 LVOT Area:     2.54 cm  LV Volumes (MOD) LV vol d, MOD A2C: 91.5 ml LV vol d, MOD A4C: 135.0 ml LV vol s, MOD A2C: 32.0 ml LV vol s, MOD A4C: 48.1 ml LV SV MOD A2C:     59.5 ml LV SV MOD A4C:     135.0 ml LV SV MOD BP:      74.2 ml RIGHT VENTRICLE             IVC RV Basal diam:  2.60 cm     IVC diam: 1.50 cm RV Mid diam:    1.40 cm RV S prime:     10.30 cm/s TAPSE (M-mode): 1.8 cm LEFT ATRIUM             Index        RIGHT ATRIUM           Index LA diam:        2.80 cm 1.35 cm/m   RA Area:     12.90 cm LA Vol (A2C):   59.7 ml 28.71 ml/m   RA Volume:   23.80 ml  11.44 ml/m LA Vol (A4C):   33.0 ml 15.87 ml/m LA Biplane Vol: 44.6 ml 21.45 ml/m  AORTIC VALVE                    PULMONIC VALVE AV Area (Vmax):    1.92 cm     PV Vmax:       0.65 m/s AV Area (Vmean):   1.67 cm     PV Peak grad:  1.7 mmHg AV Area (VTI):     2.01 cm AV Vmax:           125.00 cm/s  AV Vmean:          83.100 cm/s AV VTI:            0.180 m AV Peak Grad:      6.2 mmHg AV Mean Grad:      3.0 mmHg LVOT Vmax:         94.30 cm/s LVOT Vmean:        54.500 cm/s LVOT VTI:          0.142 m LVOT/AV VTI ratio: 0.79  AORTA Ao Root diam: 2.70 cm Ao Asc diam:  2.80 cm MITRAL VALVE MV Area (PHT): 3.76 cm     SHUNTS MV Area VTI:   2.31 cm     Systemic VTI:  0.14 m MV Peak grad:  4.2 mmHg     Systemic Diam: 1.80 cm MV Mean grad:  2.0 mmHg MV Vmax:       1.03 m/s MV Vmean:      59.2 cm/s MV Decel Time: 202 msec MV E velocity: 50.60 cm/s MV A velocity: 117.00 cm/s MV E/A ratio:  0.43 Weston Brass MD Electronically signed by Weston Brass MD Signature Date/Time: 09/07/2023/9:57:17 PM    Final     Recent Labs: Lab Results  Component Value Date   WBC 13.4 (H) 09/06/2023   HGB 13.3 09/06/2023   PLT 175 09/06/2023   NA 132 (L) 09/06/2023   K 4.3 09/06/2023   CL 91 (L) 09/06/2023   CO2 25 09/06/2023   GLUCOSE 494 (H) 09/06/2023   BUN 43 (H) 09/06/2023   CREATININE 1.43 (H) 09/06/2023   BILITOT 1.9 (H) 07/24/2022   ALKPHOS 141 (H) 07/24/2022   AST 38 07/24/2022   ALT 9 07/24/2022   PROT 6.6 07/24/2022   ALBUMIN 2.4 (L) 07/24/2022   CALCIUM 9.6 09/06/2023   GFRAA >60 11/22/2019    Speciality Comments: No specialty comments available.  Procedures:  No procedures performed Allergies: Tramadol   Assessment / Plan:     Visit Diagnoses: Polyarthritis - Plan: Sedimentation rate, C-reactive protein, Rheumatoid factor, Cyclic citrul peptide antibody, IgG, Serum protein electrophoresis with reflex, predniSONE (DELTASONE) 5 MG tablet Concern for PMR. New onset of joint  pain and stiffness in shoulders and hands over the past four months. Symptoms improved with prednisone but worsened diabetes control. No prior history of similar symptoms or joint injuries. -Checking ESR, CRP, RF, CCP, SPEP screening for underlying disease or malignancy -Continue prednisone, with a plan to gradually decrease dosage. Prescribe smaller 5mg  tablets to facilitate dosage adjustment. -If symptoms worsen significantly upon dosage decrease, patient advised to revert to previous dosage until follow-up. -Schedule follow-up in 4-6 weeks to assess symptom progression and review x-ray results.  Fibromyalgia Longstanding history of myofascial and widespread pain, shoulders and back muscle tenderness on exam. May be in exacerbation but patient reports symptoms are atypical and with high sed rate.  Chronic pain of both shoulders - Plan: XR Shoulder Left, XR Shoulder Right Shoulder xrays obtained, evidence of chronic rotator cuff tendinopathy or tear, AC joint osteoarthritis.  Diabetes Worsened control due to prednisone use. -Monitor blood glucose levels closely while on prednisone.    Orders: Orders Placed This Encounter  Procedures   XR Shoulder Left   XR Shoulder Right   Sedimentation rate   C-reactive protein   Rheumatoid factor   Cyclic citrul peptide antibody, IgG   Serum protein electrophoresis with reflex   Meds ordered this encounter  Medications   predniSONE (DELTASONE) 5 MG tablet  Sig: Take 3 tablets (15 mg total) by mouth daily with breakfast for 14 days, THEN 2.5 tablets (12.5 mg total) daily with breakfast for 14 days, THEN 2 tablets (10 mg total) daily with breakfast for 14 days.    Dispense:  105 tablet    Refill:  0     Follow-Up Instructions: Return in about 6 weeks (around 10/28/2023) for ?PMR GC taper f/u 6wks.   Fuller Plan, MD  Note - This record has been created using [employer].  Chart creation errors have been sought, but may not  always  have been located. Such creation errors do not reflect on  the standard of medical care.

## 2023-09-19 ENCOUNTER — Ambulatory Visit: Payer: PPO | Admitting: Podiatry

## 2023-09-19 ENCOUNTER — Encounter: Payer: Self-pay | Admitting: Podiatry

## 2023-09-19 DIAGNOSIS — E1169 Type 2 diabetes mellitus with other specified complication: Secondary | ICD-10-CM | POA: Diagnosis not present

## 2023-09-19 DIAGNOSIS — B351 Tinea unguium: Secondary | ICD-10-CM | POA: Diagnosis not present

## 2023-09-19 DIAGNOSIS — M79675 Pain in left toe(s): Secondary | ICD-10-CM

## 2023-09-19 DIAGNOSIS — M79674 Pain in right toe(s): Secondary | ICD-10-CM | POA: Diagnosis not present

## 2023-09-19 DIAGNOSIS — E785 Hyperlipidemia, unspecified: Secondary | ICD-10-CM

## 2023-09-19 NOTE — Progress Notes (Signed)
  Subjective:  Patient ID: [name], female    DOB: October 28, 1960,   MRN: [id number]  No chief complaint on file.   62 y.o. female presents for concern of thickened elongated and painful nails that are difficult to trim. Requesting to have them trimmed today. Denies burning and tingling in their feet. Patient is diabetic and last A1c was  Lab Results  Component Value Date   HGBA1C 6.2 (H) 07/23/2022   .   PCP:  Cleatis Polka., MD    . Denies any other pedal complaints. Denies n/v/f/c.   Past Medical History:  Diagnosis Date   Acid reflux disease    Anxiety    Asthma    Bronchitis, chronic (HCC)    Effects worse with URI   Chronic pain syndrome    COPD (chronic obstructive pulmonary disease) (HCC)    Depression    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, type 2   Diverticulitis    Dysplasia of cervix (uteri)    patient states she had dysplasia of the uterus stage 4   Endometrial cancer (HCC)    Fibromyalgia    Fibromyalgia    GERD (gastroesophageal reflux disease)    Headache(784.0)    migraine headache   Hyperlipidemia    Hypertension    Kidney stones    at least 6 stones in past   Pneumonia 2019   Restless leg syndrome    Bilateral   Vomiting    last night at 2300 after last round of antibiotic    Objective:  Physical Exam: Vascular: DP/PT pulses 2/4 bilateral. CFT <3 seconds. Absent hair growth on digits. Edema noted to bilateral lower extremities. Xerosis noted bilaterally.  Skin. No lacerations or abrasions bilateral feet. Nails 1-5 bilateral  are thickened discolored and elongated with subungual debris.  Musculoskeletal: MMT 5/5 bilateral lower extremities in DF, PF, Inversion and Eversion. Deceased ROM in DF of ankle joint.  Neurological: Sensation intact to light touch. Protective sensation diminished bilateral.     Assessment:   1. Type 2 diabetes mellitus with hyperlipidemia (HCC)   2. Pain due to onychomycosis of  toenails of both feet      Plan:  Patient was evaluated and treated and all questions answered. -Discussed and educated patient on diabetic foot care, especially with  regards to the vascular, neurological and musculoskeletal systems.  -Stressed the importance of good glycemic control and the detriment of not  controlling glucose levels in relation to the foot. -Discussed supportive shoes at all times and checking feet regularly.  -Mechanically debrided all nails 1-5 bilateral using sterile nail nipper and filed with dremel without incident  -Answered all patient questions -Patient to return  in 3 months for at risk foot care -Patient advised to call the office if any problems or questions arise in the meantime.   Louann Sjogren, DPM

## 2023-09-20 ENCOUNTER — Other Ambulatory Visit (HOSPITAL_COMMUNITY): Payer: Self-pay

## 2023-09-20 LAB — PROTEIN ELECTROPHORESIS, SERUM, WITH REFLEX
Albumin ELP: 3.8 g/dL (ref 3.8–4.8)
Alpha 1: 0.4 g/dL — ABNORMAL HIGH (ref 0.2–0.3)
Alpha 2: 1.1 g/dL — ABNORMAL HIGH (ref 0.5–0.9)
Beta 2: 0.5 g/dL (ref 0.2–0.5)
Beta Globulin: 0.5 g/dL (ref 0.4–0.6)
Gamma Globulin: 0.9 g/dL (ref 0.8–1.7)
Total Protein: 7.2 g/dL (ref 6.1–8.1)

## 2023-09-20 LAB — CYCLIC CITRUL PEPTIDE ANTIBODY, IGG: Cyclic Citrullin Peptide Ab: <16 U

## 2023-09-20 LAB — SEDIMENTATION RATE: Sed Rate: 50 mm/h — ABNORMAL HIGH (ref 0–30)

## 2023-09-20 LAB — C-REACTIVE PROTEIN: CRP: 12.2 mg/L — ABNORMAL HIGH (ref <8.0)

## 2023-09-20 LAB — RHEUMATOID FACTOR: Rheumatoid fact SerPl-aCnc: 12 [IU]/mL (ref <14)

## 2023-10-06 ENCOUNTER — Encounter: Payer: Self-pay | Admitting: Adult Health

## 2023-10-06 ENCOUNTER — Ambulatory Visit: Payer: PPO | Admitting: Adult Health

## 2023-10-24 ENCOUNTER — Other Ambulatory Visit: Payer: Self-pay | Admitting: Internal Medicine

## 2023-10-24 ENCOUNTER — Other Ambulatory Visit (HOSPITAL_COMMUNITY): Payer: Self-pay | Admitting: Psychiatry

## 2023-10-24 DIAGNOSIS — M130 Polyarthritis, unspecified: Secondary | ICD-10-CM

## 2023-10-24 DIAGNOSIS — F4310 Post-traumatic stress disorder, unspecified: Secondary | ICD-10-CM

## 2023-10-27 NOTE — Progress Notes (Signed)
 Office Visit Note  Patient: [name]             Date of Birth: 1961/05/19           MRN: [id number]             PCP: Loreli Elsie JONETTA Mickey., MD Referring: Loreli Elsie JONETTA Mickey., MD Visit Date: 10/28/2023   Subjective:  Follow-up   Discussed the use of AI scribe software for clinical note transcription with the patient, who gave verbal consent to proceed.  History of Present Illness   [name] is a 63 year old female with polymyalgia rheumatica on prednisone  with gradual tapering now at 10 mg once daily. She presents with increased stiffness in the neck and shoulders.  She has experienced increased stiffness in her neck and shoulders for the past couple of weeks, since approximately January. The stiffness has worsened following a decrease in her prednisone  dosage. It is present throughout the day and does not radiate to other areas. No numbness, tingling, or swelling is associated with the stiffness.  She has been on prednisone  for several weeks without difficulty. Her inflammation markers, including sedimentation rate and C-reactive protein, were previously elevated but have shown improvement.  She describes shoulder pain as manageable, with the right side being worse than the left. Pain occurs with certain movements, causing pressure but no sharp pain. X-rays indicate narrowing of the acromioclavicular joint space, suggesting osteoarthritis and possible chronic rotator cuff inflammation.   Previous HPI 09/16/23 [name] is a 63 y.o. female here for evaluation of bilateral joint pain and stiffness, predominantly affecting the shoulders and hands and high sedimentation rate. The symptoms began approximately four months ago, initially presenting as a sensation of a pulled muscle in the shoulder, progressing to the point where the patient was unable to lift their arm. Concurrently, the patient noticed increasing difficulty in making a fist, more so in one hand  than the other. The symptoms were described as painful, with no visible swelling or color changes noted. The discomfort and limited mobility were consistent throughout the day, with no relief or worsening at any particular time. The patient denied any lower extremity involvement.   Despite initial advice to continue using the affected joints, the patient's symptoms persisted, leading to a loss of function in the arms and hands. The patient also reported dropping objects and difficulty opening pill bottles. Numbness in the hands was noted during this period, but has since resolved.   The patient was started on prednisone  20 mg following blood work that showed a high sedimentation rate. Improvement in symptoms was noted after approximately three days of steroid therapy, with continued gradual improvement since then. The patient has been on prednisone  consistently for about two months. The main side effect noted was worsening of diabetes control.   She has a history of fibromyalgia but does not feel current symptoms are typical of her chronic muscle or joint pains. The patient has no history of similar episodes, injuries, or surgeries in the neck or shoulders. The patient has not had any imaging studies of the affected joints.    Labs reviewed 06/2023 ESR 65 CK 74   07/2022 RF 24.3 ANA neg   Review of Systems  Constitutional:  Positive for fatigue.  HENT:  Positive for mouth dryness. Negative for mouth sores.   Eyes:  Negative for dryness.  Respiratory:  Negative for shortness of breath.   Cardiovascular:  Negative for chest pain and  palpitations.  Gastrointestinal:  Negative for blood in stool, constipation and diarrhea.  Endocrine: Negative for increased urination.  Genitourinary:  Negative for involuntary urination.  Musculoskeletal:  Positive for joint pain, joint pain, morning stiffness and muscle tenderness. Negative for gait problem, joint swelling, myalgias, muscle weakness and  myalgias.  Skin:  Negative for color change, rash, hair loss and sensitivity to sunlight.  Allergic/Immunologic: Negative for susceptible to infections.  Neurological:  Negative for dizziness and headaches.  Hematological:  Negative for swollen glands.  Psychiatric/Behavioral:  Positive for depressed mood. Negative for sleep disturbance. The patient is nervous/anxious.     PMFS History:  Patient Active Problem List   Diagnosis Date Noted   Polyarthritis 09/16/2023   Bilateral shoulder pain 09/16/2023   Paroxysmal atrial fibrillation (HCC) 07/27/2022   CHF (congestive heart failure), NYHA class III, acute, systolic (HCC) 07/23/2022   Acute on chronic diastolic CHF (congestive heart failure) (HCC) 07/23/2022   Type 2 diabetes mellitus with hyperlipidemia (HCC) 01/09/2019   Essential hypertension 12/12/2018   Hyperlipidemia 12/12/2018   Preoperative clearance 12/12/2018   Endometrial cancer (HCC) 11/15/2018   Incisional hernia, without obstruction or gangrene 02/04/2014   Post-op pain 07/23/2013   Postoperative wound infection 05/25/2013   Wound disruption, post-op, skin 01/30/2013   Preoperative respiratory examination 12/18/2012   Smoking 12/18/2012   Heme positive stool 11/25/2012   Hypokalemia 11/25/2012   COPD (chronic obstructive pulmonary disease) (HCC)    Acid reflux disease    Post-operative state 11/21/2012   AKI (acute kidney injury) (HCC) 11/04/2012   HAP (hospital-acquired pneumonia) 11/03/2012   Acute pulmonary edema (HCC) 11/03/2012   Acute respiratory failure with hypoxia (HCC) 11/02/2012   Acute exacerbation of COPD with asthma (HCC) 11/02/2012   Parastomal hernia 11/02/2012   Fibromyalgia 11/02/2012   Generalized anxiety disorder 11/02/2012   Restless legs syndrome 11/02/2012   Class 2 obesity 11/02/2012   Leukocytosis 11/02/2012   Hyperglycemia 11/02/2012   Cigarette nicotine  dependence with withdrawal 11/02/2012   Complete atelectasis 11/02/2012    Past  Medical History:  Diagnosis Date   Acid reflux disease    Anxiety    Asthma    Bronchitis, chronic (HCC)    Effects worse with URI   Chronic pain syndrome    COPD (chronic obstructive pulmonary disease) (HCC)    Depression    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, type 2   Diverticulitis    Dysplasia of cervix (uteri)    patient states she had dysplasia of the uterus stage 4   Endometrial cancer (HCC)    Fibromyalgia    Fibromyalgia    GERD (gastroesophageal reflux disease)    Headache(784.0)    migraine headache   Hyperlipidemia    Hypertension    Kidney stones    at least 6 stones in past   Pneumonia 2019   Restless leg syndrome    Bilateral   Vomiting    last night at 2300 after last round of antibiotic    Family History  Problem Relation Age of Onset   Diabetes Mother    Throat cancer Mother    Uterine cancer Sister    Deep vein thrombosis Neg Hx    Pulmonary embolism Neg Hx    Past Surgical History:  Procedure Laterality Date   ABDOMINAL HYSTERECTOMY     APPENDECTOMY     APPLICATION OF WOUND VAC N/A 11/01/2012   Procedure: APPLICATION OF WOUND VAC;  Surgeon: Debby LABOR. Cornett, MD;  Location:  [facility];  Service: General;  Laterality: N/A;   CHOLECYSTECTOMY     COLONOSCOPY  2013   COLOSTOMY     COLOSTOMY CLOSURE N/A 11/01/2012   Procedure: COLOSTOMY CLOSURE;  Surgeon: Debby LABOR. Cornett, MD;  Location: [facility];  Service: General;  Laterality: N/A;  REPAIR PARASTOMAL HERNIA; REVISION OF COLOSTOMY   COLOSTOMY CLOSURE N/A 05/03/2013   Procedure: COLOSTOMY CLOSURE;  Surgeon: Debby LABOR. Cornett, MD;  Location: [facility];  Service: General;  Laterality: N/A;   DILATION AND CURETTAGE OF UTERUS N/A 05/24/2019   Procedure: DILATATION AND CURETTAGE;  Surgeon: Shannon Agent, MD;  Location: [facility];  Service: Gynecology;  Laterality: N/A;   ESOPHAGOGASTRODUODENOSCOPY Left 11/27/2012   Procedure: ESOPHAGOGASTRODUODENOSCOPY (EGD);  Surgeon: Agent LITTIE Celestia Mickey., MD;   Location: [facility];  Service: Gastroenterology;  Laterality: Left;   INCISIONAL HERNIA REPAIR N/A 11/01/2012   Procedure: HERNIA REPAIR INCISIONAL;  Surgeon: Debby LABOR. Cornett, MD;  Location: [facility];  Service: General;  Laterality: N/A;   LAPAROTOMY  11/01/2012   Procedure: EXPLORATORY LAPAROTOMY;  Surgeon: Debby LABOR. Cornett, MD;  Location: [facility];  Service: General;;  exploratory laparotomy,repair of parastomal hernia and incisional hernia, lysis of adhesions, and application of wound V.A.C.   LYSIS OF ADHESION N/A 11/01/2012   Procedure: LYSIS OF ADHESION;  Surgeon: Debby LABOR. Cornett, MD;  Location: [facility];  Service: General;  Laterality: N/A;   OPERATIVE ULTRASOUND N/A 06/14/2019   Procedure: OPERATIVE ULTRASOUND;  Surgeon: Shannon Agent, MD;  Location: [facility];  Service: Urology;  Laterality: N/A;   PARASTOMAL HERNIA REPAIR N/A 11/01/2012   Procedure: HERNIA REPAIR PARASTOMAL;  Surgeon: Debby LABOR. Cornett, MD;  Location: [facility];  Service: General;  Laterality: N/A;   PARTIAL HYSTERECTOMY     partial   RIGHT HEART CATH N/A 07/26/2022   Procedure: RIGHT HEART CATH;  Surgeon: Rolan Ezra RAMAN, MD;  Location: [facility];  Service: Cardiovascular;  Laterality: N/A;   TANDEM RING INSERTION N/A 05/24/2019   Procedure: INSERTION OF HYMEN CAPSULE;  Surgeon: Shannon Agent, MD;  Location: [facility];  Service: Gynecology;  Laterality: N/A;   TANDEM RING INSERTION N/A 06/14/2019   Procedure: HYMEN CAPSULE PLACEMENT;  Surgeon: Shannon Agent, MD;  Location: [facility];  Service: Urology;  Laterality: N/A;   Social History   Social History Narrative   Not on file   Immunization History  Administered Date(s) Administered   Influenza,inj,Quad PF,6+ Mos 06/08/2017   Pneumococcal Polysaccharide-23 07/22/2011   Tdap 07/01/2015     Objective: Vital Signs: BP 135/75 (BP Location: Left Arm, Patient Position: Sitting, Cuff Size: Normal)   Pulse 89   Resp 14   Ht 5' 3 (1.6 m)   Wt 215 lb (97.5 kg)   LMP  04/20/2013   BMI 38.09 kg/m    Physical Exam Cardiovascular:     Rate and Rhythm: Normal rate and regular rhythm.  Pulmonary:     Effort: Pulmonary effort is normal.     Breath sounds: Normal breath sounds.  Musculoskeletal:     Right lower leg: No edema.     Left lower leg: No edema.  Skin:    General: Skin is warm and dry.     Findings: Bruising present.     Comments: Bilateral forearms and hands  Neurological:     Mental Status: She is alert.  Psychiatric:        Mood and Affect: Mood normal.      Musculoskeletal Exam:  Hump at  upper thoracic spine without localized tenderness, anterior cervical spine position Shoulders mild tenderness to pressure, no swelling, restricted overhead abduction better in passive than active movement Elbows full ROM no tenderness or swelling Wrists full ROM no tenderness or swelling Fingers full ROM no tenderness or swelling Knees full ROM no tenderness or swelling  Investigation: No additional findings.  Imaging: No results found.  Recent Labs: Lab Results  Component Value Date   WBC 13.4 (H) 09/06/2023   HGB 13.3 09/06/2023   PLT 175 09/06/2023   NA 132 (L) 09/06/2023   K 4.3 09/06/2023   CL 91 (L) 09/06/2023   CO2 25 09/06/2023   GLUCOSE 494 (H) 09/06/2023   BUN 43 (H) 09/06/2023   CREATININE 1.43 (H) 09/06/2023   BILITOT 1.9 (H) 07/24/2022   ALKPHOS 141 (H) 07/24/2022   AST 38 07/24/2022   ALT 9 07/24/2022   PROT 7.2 09/16/2023   ALBUMIN  2.4 (L) 07/24/2022   CALCIUM  9.6 09/06/2023   GFRAA >60 11/22/2019    Speciality Comments: No specialty comments available.  Procedures:  No procedures performed Allergies: Tramadol    Assessment / Plan:     Visit Diagnoses: Polymyalgia rheumatica (HCC) - Plan: Sedimentation rate, C-reactive protein Increased neck and shoulder stiffness for the past couple of weeks, possibly related to decreasing prednisone . Inflammatory markers previously improved but not normalized. No  associated numbness, tingling, or swelling. -Check inflammatory markers today. -Plan to adjust prednisone  dose based on lab results. If markers have improved, gradually taper prednisone  by 2.5mg  at a time. If markers have increased, maintain current prednisone  dose.  Long term (current) use of systemic steroids - Plan: CBC with Differential/Platelet, BASIC METABOLIC PANEL WITH GFR Checking blood count and basic metabolic panel for monitoring on continued use of moderate dose prednisone .  Previously with multiple abnormalities in electrolytes, renal function, and leukocytosis.  Paroxysmal atrial fibrillation (HCC) On long-term anticoagulation.  Does have very easy bruising on extremities discussed also increased risk of long-term prednisone  use.  No appreciable exacerbation and heart rate fluid retention or edema.  Shoulder Osteoarthritis X-ray shows narrowing of the acromioclavicular joint and rotator cuff space, suggesting chronic tightness or inflammation. No severe osteoarthritis in the glenohumeral joint. Pain on shoulder movement, worse on the right side. -Provide printed shoulder exercises focusing on movements in the lower range. -Consider physical therapy if symptoms worsen. -Consider steroid injection if shoulder pain becomes unmanageable.    Orders: Orders Placed This Encounter  Procedures   Sedimentation rate   C-reactive protein   CBC with Differential/Platelet   BASIC METABOLIC PANEL WITH GFR   No orders of the defined types were placed in this encounter.    Follow-Up Instructions: Return in about 3 months (around 01/25/2024) for PMR GC taper f/u 3mos.   Lonni LELON Ester, MD  Note - This record has been created using [employer].  Chart creation errors have been sought, but may not always  have been located. Such creation errors do not reflect on  the standard of medical care.

## 2023-10-28 ENCOUNTER — Ambulatory Visit
Admission: RE | Admit: 2023-10-28 | Discharge: 2023-10-28 | Disposition: A | Discharge status: Home / Self Care | Payer: PPO | Source: Ambulatory Visit | Attending: Pulmonary Disease | Admitting: Pulmonary Disease

## 2023-10-28 ENCOUNTER — Ambulatory Visit: Payer: PPO | Attending: Internal Medicine | Admitting: Internal Medicine

## 2023-10-28 ENCOUNTER — Encounter: Payer: Self-pay | Admitting: Internal Medicine

## 2023-10-28 VITALS — BP 135/75 | HR 89 | Resp 14 | Ht 63.0 in | Wt 215.0 lb

## 2023-10-28 DIAGNOSIS — J849 Interstitial pulmonary disease, unspecified: Secondary | ICD-10-CM | POA: Diagnosis not present

## 2023-10-28 DIAGNOSIS — E041 Nontoxic single thyroid nodule: Secondary | ICD-10-CM | POA: Diagnosis not present

## 2023-10-28 DIAGNOSIS — R918 Other nonspecific abnormal finding of lung field: Secondary | ICD-10-CM | POA: Diagnosis not present

## 2023-10-28 DIAGNOSIS — Z7952 Long term (current) use of systemic steroids: Secondary | ICD-10-CM

## 2023-10-28 DIAGNOSIS — J439 Emphysema, unspecified: Secondary | ICD-10-CM | POA: Diagnosis not present

## 2023-10-28 DIAGNOSIS — M353 Polymyalgia rheumatica: Secondary | ICD-10-CM

## 2023-10-28 DIAGNOSIS — I480 Paroxysmal atrial fibrillation: Secondary | ICD-10-CM

## 2023-10-29 LAB — BASIC METABOLIC PANEL WITH GFR
BUN/Creatinine Ratio: 27 (calc) — ABNORMAL HIGH (ref 6–22)
BUN: 37 mg/dL — ABNORMAL HIGH (ref 7–25)
CO2: 28 mmol/L (ref 20–32)
Calcium: 9.9 mg/dL (ref 8.6–10.4)
Chloride: 100 mmol/L (ref 98–110)
Creat: 1.35 mg/dL — ABNORMAL HIGH (ref 0.50–1.05)
Glucose, Bld: 229 mg/dL — ABNORMAL HIGH (ref 65–99)
Potassium: 4.2 mmol/L (ref 3.5–5.3)
Sodium: 140 mmol/L (ref 135–146)
eGFR: 44 mL/min/{1.73_m2} — ABNORMAL LOW (ref >=60)

## 2023-10-29 LAB — CBC WITH DIFFERENTIAL/PLATELET
Absolute Lymphocytes: 6956 {cells}/uL — ABNORMAL HIGH (ref 850–3900)
Absolute Monocytes: 829 {cells}/uL (ref 200–950)
Basophils Absolute: 44 {cells}/uL (ref 0–200)
Basophils Relative: 0.3 %
Eosinophils Absolute: 59 {cells}/uL (ref 15–500)
Eosinophils Relative: 0.4 %
HCT: 33.1 % — ABNORMAL LOW (ref 35.0–45.0)
Hemoglobin: 9.4 g/dL — ABNORMAL LOW (ref 11.7–15.5)
MCH: 23.8 pg — ABNORMAL LOW (ref 27.0–33.0)
MCHC: 28.4 g/dL — ABNORMAL LOW (ref 32.0–36.0)
MCV: 83.8 fL (ref 80.0–100.0)
MPV: 10.7 fL (ref 7.5–12.5)
Monocytes Relative: 5.6 %
Neutro Abs: 6912 {cells}/uL (ref 1500–7800)
Neutrophils Relative %: 46.7 %
Platelets: 327 10*3/uL (ref 140–400)
RBC: 3.95 10*6/uL (ref 3.80–5.10)
RDW: 15.2 % — ABNORMAL HIGH (ref 11.0–15.0)
Total Lymphocyte: 47 %
WBC: 14.8 10*3/uL — ABNORMAL HIGH (ref 3.8–10.8)

## 2023-10-29 LAB — C-REACTIVE PROTEIN: CRP: 5.5 mg/L (ref <8.0)

## 2023-10-29 LAB — SEDIMENTATION RATE: Sed Rate: 34 mm/h — ABNORMAL HIGH (ref 0–30)

## 2023-10-30 ENCOUNTER — Other Ambulatory Visit (HOSPITAL_COMMUNITY): Payer: Self-pay | Admitting: Psychiatry

## 2023-10-30 DIAGNOSIS — F4310 Post-traumatic stress disorder, unspecified: Secondary | ICD-10-CM

## 2023-10-30 DIAGNOSIS — F411 Generalized anxiety disorder: Secondary | ICD-10-CM

## 2023-10-30 DIAGNOSIS — F331 Major depressive disorder, recurrent, moderate: Secondary | ICD-10-CM

## 2023-11-02 ENCOUNTER — Telehealth: Payer: Self-pay | Admitting: *Deleted

## 2023-11-02 DIAGNOSIS — M353 Polymyalgia rheumatica: Secondary | ICD-10-CM

## 2023-11-02 NOTE — Telephone Encounter (Signed)
Patient contacted the office stating she was advised at her appointment that a prescription for Prednisone would be sent to the pharmacy for her after her labs. Patient would like to know when that may be done. Please advise.

## 2023-11-04 NOTE — Telephone Encounter (Signed)
Patient contacted the office again today. Patient states she was supposed to have a prescription for Prednisone sent to the pharmacy after her labs. Patient states she has still note received the prescription and is almost out of her current prescription. Please advise.

## 2023-11-07 MED ORDER — PREDNISONE 5 MG PO TABS: ORAL_TABLET | ORAL | 0 refills | Status: DC

## 2023-11-07 NOTE — Telephone Encounter (Signed)
Sent new prednisone Rx. Her sed rate is doing better but still elevated. So I recommend continuing the 10 mg daily for another 2 weeks. Then decrease to 7.5 mg for 1 month. Then 5 mg until we follow up.

## 2023-11-07 NOTE — Addendum Note (Signed)
Addended by: Fuller Plan on: 11/07/2023 04:17 PM   Modules accepted: Orders

## 2023-11-07 NOTE — Telephone Encounter (Signed)
Patient advised Dr. Dimple Casey Sent new prednisone Rx. Her sed rate is doing better but still elevated. So Dr. Dimple Casey recommends continuing the 10 mg daily for another 2 weeks. Then decrease to 7.5 mg for 1 month. Then 5 mg until we follow up. Patient verbalized understanding.

## 2023-11-09 IMAGING — CT CT CHEST HIGH RESOLUTION
1 of 5 series · 14 of 31 positions shown, 18 images · non-contrast
Comparison: Lung cancer screening CT dated June 25, 2021

CLINICAL DATA: Interstitial lung disease



[Series 8: super d · axial · 0.70mm/px · z∈[+928,+1194]mm · 14 of 497 slices shown, 18 images]
[im 27/497  mediastinal]
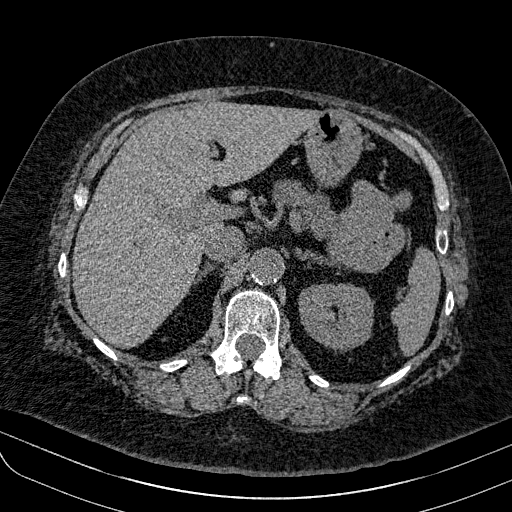
[im 27/497  lung]
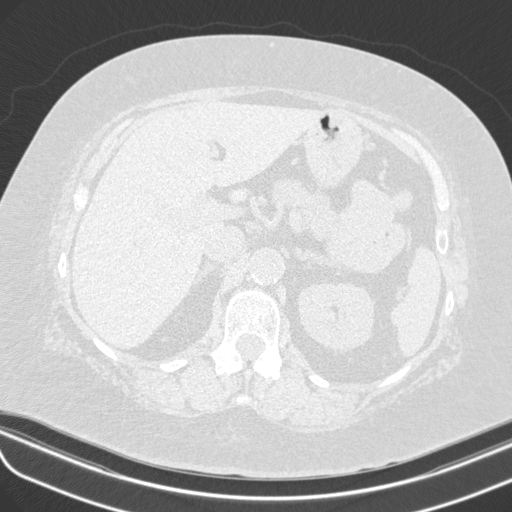
[im 79/497  lung]
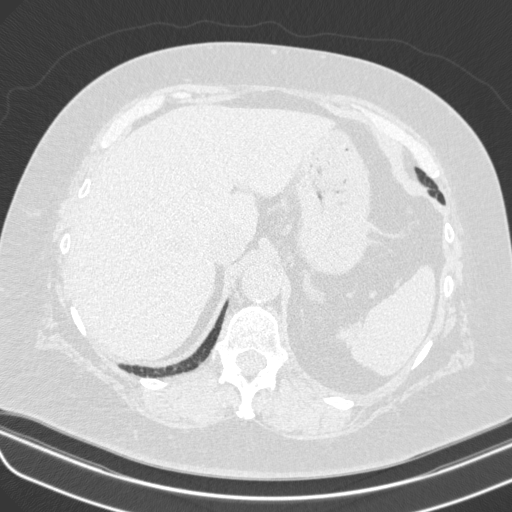
[im 105/497  lung]
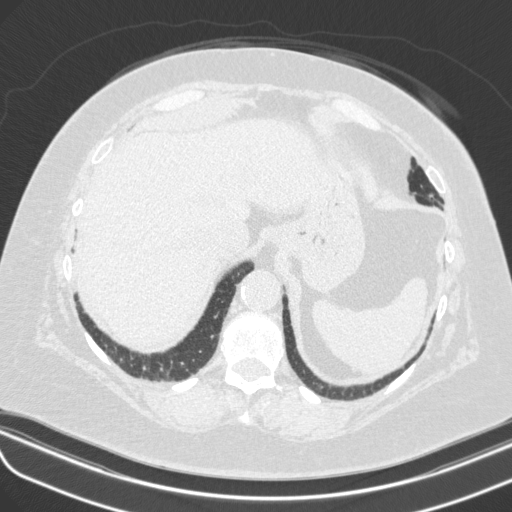
[im 131/497  lung]
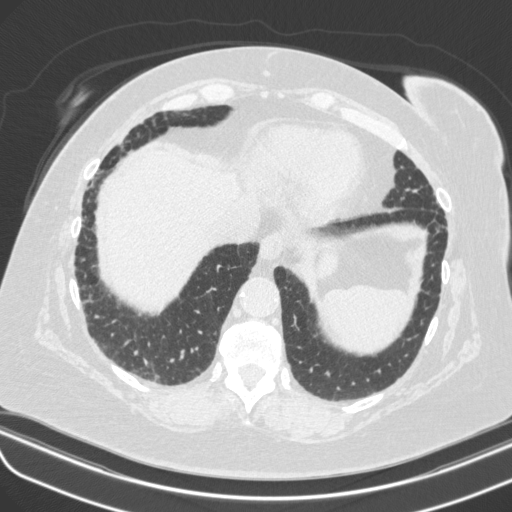
[im 183/497  mediastinal]
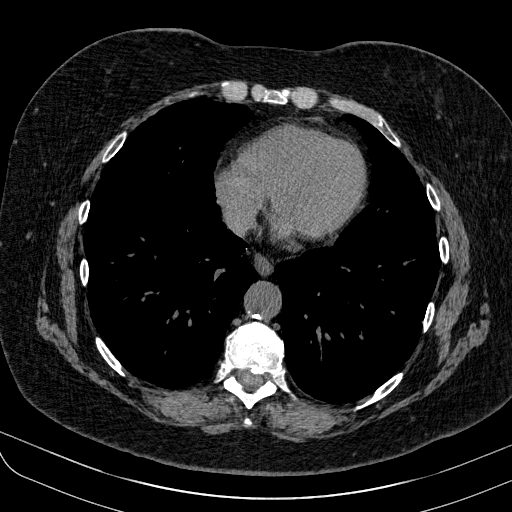
[im 183/497  lung]
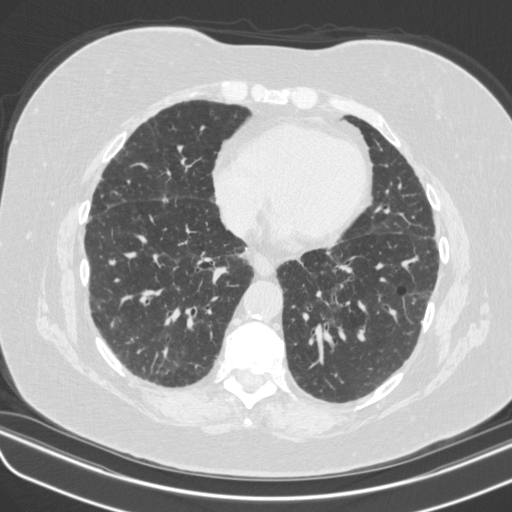
[im 209/497  lung]
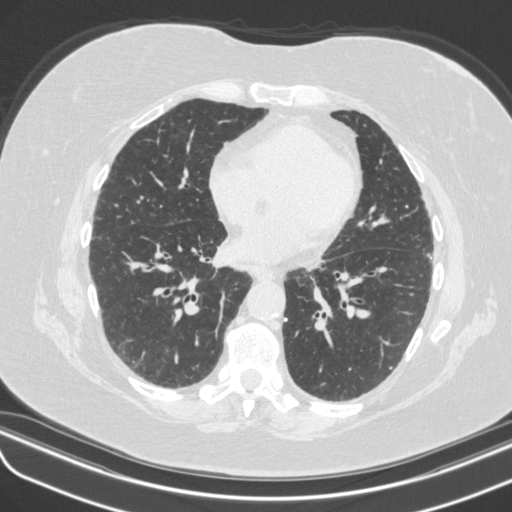
[im 232/497  lung]
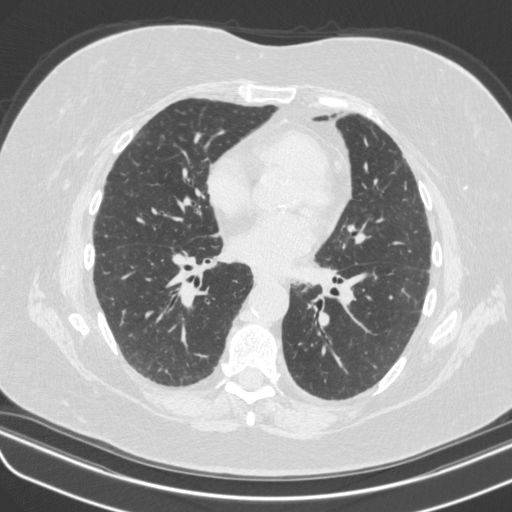
[im 262/497  lung]
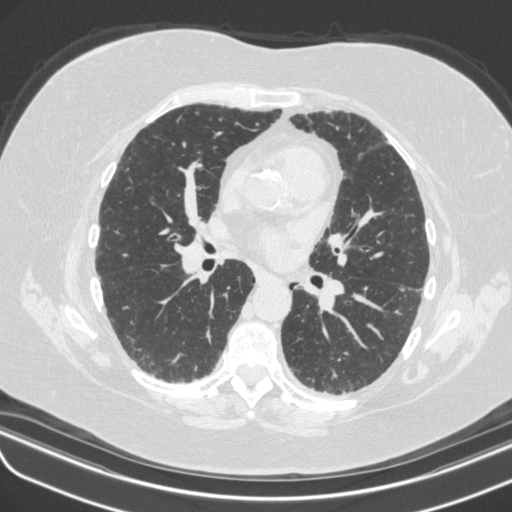
[im 288/497  mediastinal]
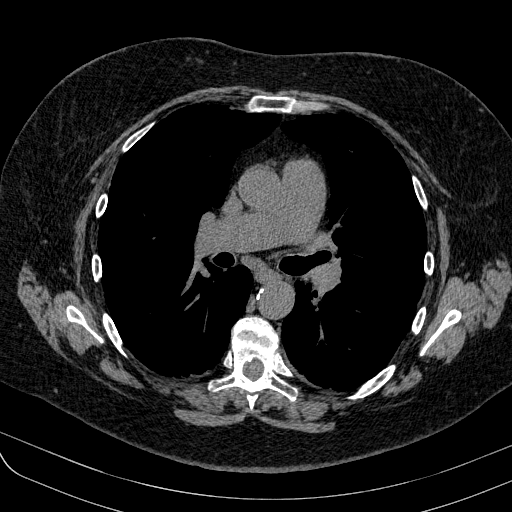
[im 288/497  lung]
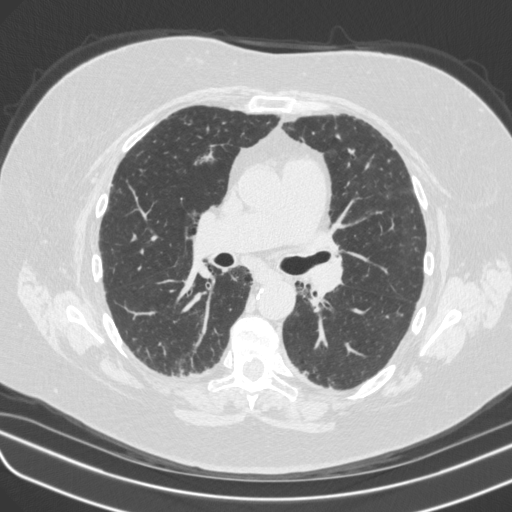
[im 314/497  lung]
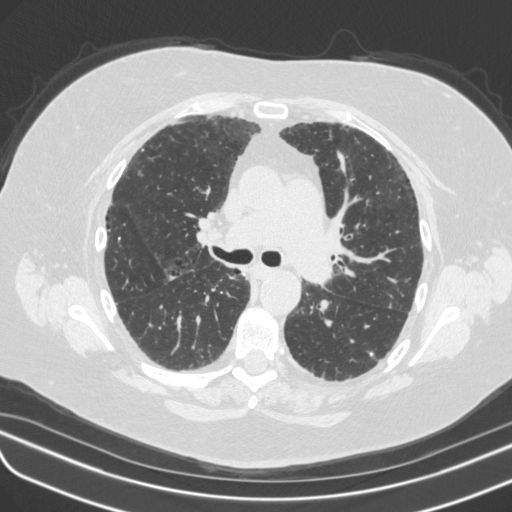
[im 366/497  lung]
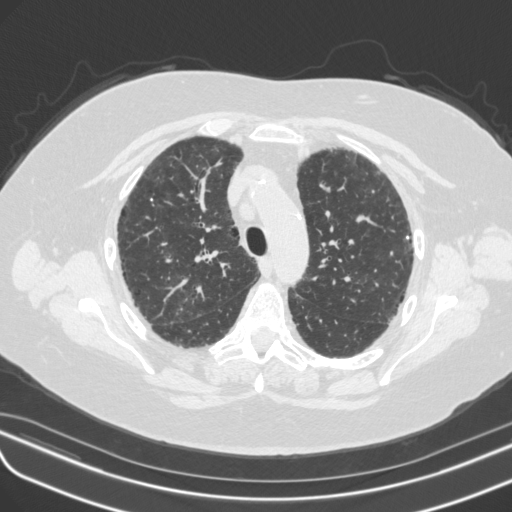
[im 392/497  lung]
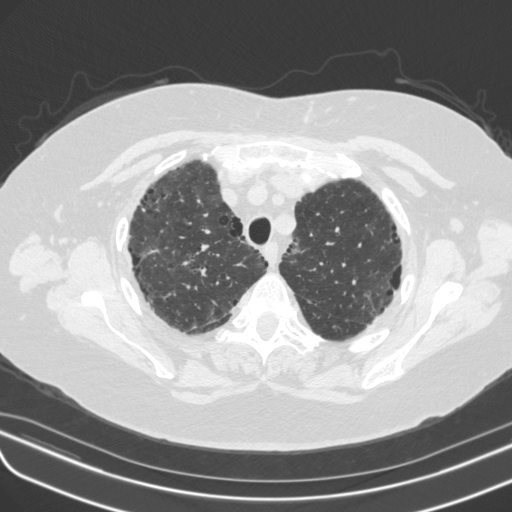
[im 418/497  mediastinal]
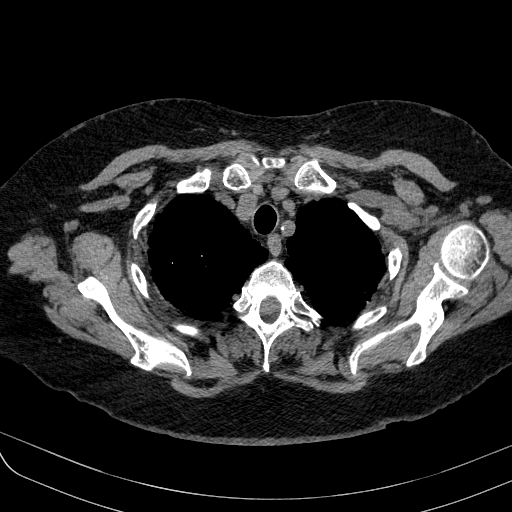
[im 418/497  lung]
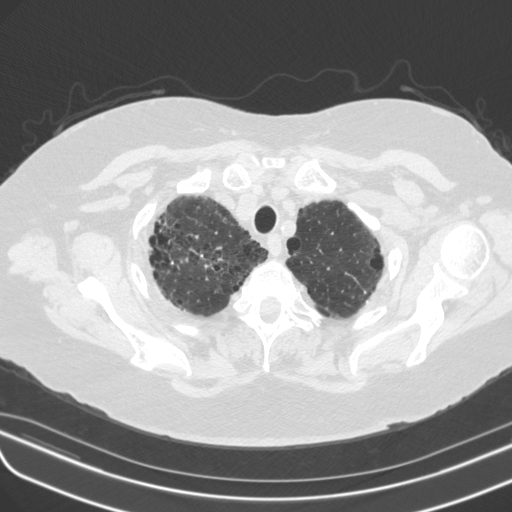
[im 470/497  lung]
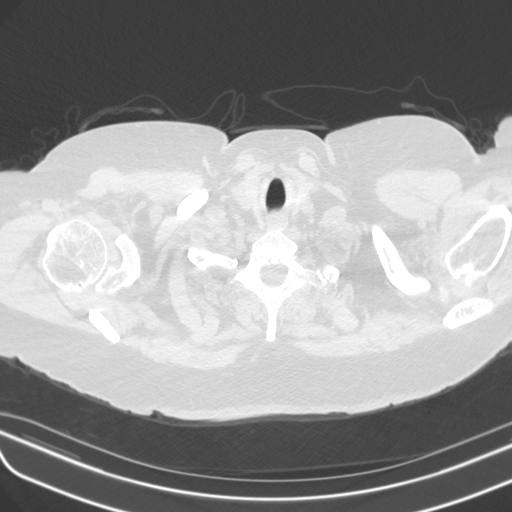

[14 of 31 positions shown; findings below may reference images not displayed]

FINDINGS: Cardiovascular: Normal heart size. No pericardial effusion. Coronary
artery calcifications of the LAD. Atherosclerotic disease of the
thoracic aorta. Dilated main pulmonary artery measuring up to
cm.

Mediastinum/Nodes: Esophagus is unremarkable. Low-density left
thyroid nodule, unchanged compared with prior exam. Mildly enlarged
mediastinal lymph nodes, likely reactive. Right lower paratracheal
lymph node measuring 1.1 cm in short axis on series 2, image 39.

Lungs/Pleura: Central airways are patent. Mild bilateral air
trapping. Paraseptal emphysema. Mild subpleural reticular opacities
with no definite traction bronchiectasis or honeycomb change. Linear
opacity of the right upper lobe seen on series 10, image 65, likely
sequela of prior infection. Stable small solid pulmonary nodules.
Largest is a solid pulmonary nodule of the right middle lobe
measuring 6 mm on series 10, image 166.

Upper Abdomen: No acute abnormality.

Musculoskeletal: No chest wall mass or suspicious bone lesions
identified.
IMPRESSION: 1. Emphysema and subpleural reticular opacities. No clear
craniocaudal predominance, no definite traction bronchiectasis or
honeycomb change, favor smoking related interstitial fibrosis. No
evidence of progression when compared with June 25, 2021 and December 11, 2019 prior CTs. Findings are indeterminate for UIP per consensus
guidelines: Diagnosis of Idiopathic Pulmonary Fibrosis: An Official
ATS/ERS/JRS/ALAT Clinical Practice Guideline. Am J Respir Crit Care
Med Vol 198, Asevedo 5, ppe11-e[DATE].
2. Bilateral air trapping, findings can be seen in the setting of
small airways disease.
3. Stable bilateral solid pulmonary nodules. Recommend attention on
annual lung cancer screening CT.
4. Dilated main pulmonary artery, findings can be seen in the
setting of pulmonary hypertension.
5. Aortic Atherosclerosis (B5LML-MW9.9) and Emphysema (B5LML-HL5.O).

## 2023-12-19 ENCOUNTER — Ambulatory Visit (INDEPENDENT_AMBULATORY_CARE_PROVIDER_SITE_OTHER): Payer: PPO | Admitting: Podiatry

## 2023-12-19 DIAGNOSIS — Z91199 Patient's noncompliance with other medical treatment and regimen due to unspecified reason: Secondary | ICD-10-CM

## 2023-12-19 NOTE — Progress Notes (Signed)
 No show

## 2023-12-27 ENCOUNTER — Other Ambulatory Visit (HOSPITAL_COMMUNITY): Payer: Self-pay | Admitting: Psychiatry

## 2023-12-27 DIAGNOSIS — F411 Generalized anxiety disorder: Secondary | ICD-10-CM

## 2023-12-27 DIAGNOSIS — F331 Major depressive disorder, recurrent, moderate: Secondary | ICD-10-CM

## 2023-12-27 DIAGNOSIS — F4310 Post-traumatic stress disorder, unspecified: Secondary | ICD-10-CM

## 2023-12-28 ENCOUNTER — Encounter: Payer: PPO | Admitting: Internal Medicine

## 2024-01-09 ENCOUNTER — Telehealth (HOSPITAL_BASED_OUTPATIENT_CLINIC_OR_DEPARTMENT_OTHER): Admitting: Psychiatry

## 2024-01-09 ENCOUNTER — Encounter (HOSPITAL_COMMUNITY): Payer: Self-pay | Admitting: Psychiatry

## 2024-01-09 DIAGNOSIS — F331 Major depressive disorder, recurrent, moderate: Secondary | ICD-10-CM | POA: Diagnosis not present

## 2024-01-09 DIAGNOSIS — F4310 Post-traumatic stress disorder, unspecified: Secondary | ICD-10-CM | POA: Diagnosis not present

## 2024-01-09 DIAGNOSIS — F411 Generalized anxiety disorder: Secondary | ICD-10-CM | POA: Diagnosis not present

## 2024-01-09 MED ORDER — DOXEPIN HCL 100 MG PO CAPS: 100.0000 mg | ORAL_CAPSULE | Freq: Every day | ORAL | 2 refills | Status: DC

## 2024-01-09 MED ORDER — FLUOXETINE HCL 40 MG PO CAPS: ORAL_CAPSULE | ORAL | 2 refills | Status: DC

## 2024-01-09 MED ORDER — CLONAZEPAM 0.25 MG PO TBDP: 0.2500 mg | ORAL_TABLET | Freq: Two times a day (BID) | ORAL | 2 refills | Status: DC | PRN

## 2024-01-09 MED ORDER — ZIPRASIDONE HCL 20 MG PO CAPS: 20.0000 mg | ORAL_CAPSULE | Freq: Every day | ORAL | 2 refills | Status: DC
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
## 2024-01-10 DIAGNOSIS — I2781 Cor pulmonale (chronic): Secondary | ICD-10-CM | POA: Diagnosis not present

## 2024-01-10 DIAGNOSIS — M353 Polymyalgia rheumatica: Secondary | ICD-10-CM | POA: Diagnosis not present

## 2024-01-10 DIAGNOSIS — E139 Other specified diabetes mellitus without complications: Secondary | ICD-10-CM | POA: Diagnosis not present

## 2024-01-10 DIAGNOSIS — F3341 Major depressive disorder, recurrent, in partial remission: Secondary | ICD-10-CM | POA: Diagnosis not present

## 2024-01-10 DIAGNOSIS — I480 Paroxysmal atrial fibrillation: Secondary | ICD-10-CM | POA: Diagnosis not present

## 2024-01-10 DIAGNOSIS — J449 Chronic obstructive pulmonary disease, unspecified: Secondary | ICD-10-CM | POA: Diagnosis not present

## 2024-01-10 DIAGNOSIS — J849 Interstitial pulmonary disease, unspecified: Secondary | ICD-10-CM | POA: Diagnosis not present

## 2024-01-10 DIAGNOSIS — D6869 Other thrombophilia: Secondary | ICD-10-CM | POA: Diagnosis not present

## 2024-01-10 DIAGNOSIS — I10 Essential (primary) hypertension: Secondary | ICD-10-CM | POA: Diagnosis not present

## 2024-01-10 DIAGNOSIS — I700 Atherosclerosis of aorta: Secondary | ICD-10-CM | POA: Diagnosis not present

## 2024-01-10 DIAGNOSIS — F319 Bipolar disorder, unspecified: Secondary | ICD-10-CM | POA: Diagnosis not present

## 2024-01-19 NOTE — Progress Notes (Deleted)
 Office Visit Note  Patient: [name]             Date of Birth: Jan 02, 1961           MRN: [id number]             PCP: Jeannine Milroy., MD Referring: Jeannine Milroy., MD Visit Date: 01/25/2024   Subjective:  No chief complaint on file.   History of Present Illness: [name] is a 63 y.o. female here for follow up with polymyalgia rheumatica on prednisone  with gradual tapering.     Previous HPI 10/28/2023 [name] is a 63 year old female with polymyalgia rheumatica on prednisone  with gradual tapering now at 10 mg once daily. She presents with increased stiffness in the neck and shoulders.   She has experienced increased stiffness in her neck and shoulders for the past couple of weeks, since approximately January. The stiffness has worsened following a decrease in her prednisone  dosage. It is present throughout the day and does not radiate to other areas. No numbness, tingling, or swelling is associated with the stiffness.   She has been on prednisone  for several weeks without difficulty. Her inflammation markers, including sedimentation rate and C-reactive protein, were previously elevated but have shown improvement.   She describes shoulder pain as manageable, with the right side being worse than the left. Pain occurs with certain movements, causing pressure but no sharp pain. X-rays indicate narrowing of the acromioclavicular joint space, suggesting osteoarthritis and possible chronic rotator cuff inflammation.     Previous HPI 09/16/23 [name] is a 63 y.o. female here for evaluation of bilateral joint pain and stiffness, predominantly affecting the shoulders and hands and high sedimentation rate. The symptoms began approximately four months ago, initially presenting as a sensation of a pulled muscle in the shoulder, progressing to the point where the patient was unable to lift their arm. Concurrently, the patient noticed increasing  difficulty in making a fist, more so in one hand than the other. The symptoms were described as painful, with no visible swelling or color changes noted. The discomfort and limited mobility were consistent throughout the day, with no relief or worsening at any particular time. The patient denied any lower extremity involvement.   Despite initial advice to continue using the affected joints, the patient's symptoms persisted, leading to a loss of function in the arms and hands. The patient also reported dropping objects and difficulty opening pill bottles. Numbness in the hands was noted during this period, but has since resolved.   The patient was started on prednisone  20 mg following blood work that showed a high sedimentation rate. Improvement in symptoms was noted after approximately three days of steroid therapy, with continued gradual improvement since then. The patient has been on prednisone  consistently for about two months. The main side effect noted was worsening of diabetes control.   She has a history of fibromyalgia but does not feel current symptoms are typical of her chronic muscle or joint pains. The patient has no history of similar episodes, injuries, or surgeries in the neck or shoulders. The patient has not had any imaging studies of the affected joints.    Labs reviewed 06/2023 ESR 65 CK 74   07/2022 RF 24.3 ANA neg   No Rheumatology ROS completed.   PMFS History:  Patient Active Problem List   Diagnosis Date Noted   Polyarthritis 09/16/2023   Bilateral shoulder pain 09/16/2023   Paroxysmal  atrial fibrillation (HCC) 07/27/2022   CHF (congestive heart failure), NYHA class III, acute, systolic (HCC) 07/23/2022   Acute on chronic diastolic CHF (congestive heart failure) (HCC) 07/23/2022   Type 2 diabetes mellitus with hyperlipidemia (HCC) 01/09/2019   Essential hypertension 12/12/2018   Hyperlipidemia 12/12/2018   Preoperative clearance 12/12/2018   Endometrial cancer  (HCC) 11/15/2018   Incisional hernia, without obstruction or gangrene 02/04/2014   Post-op pain 07/23/2013   Postoperative wound infection 05/25/2013   Wound disruption, post-op, skin 01/30/2013   Preoperative respiratory examination 12/18/2012   Smoking 12/18/2012   Heme positive stool 11/25/2012   Hypokalemia 11/25/2012   COPD (chronic obstructive pulmonary disease) (HCC)    Acid reflux disease    Post-operative state 11/21/2012   AKI (acute kidney injury) (HCC) 11/04/2012   HAP (hospital-acquired pneumonia) 11/03/2012   Acute pulmonary edema (HCC) 11/03/2012   Acute respiratory failure with hypoxia (HCC) 11/02/2012   Acute exacerbation of COPD with asthma (HCC) 11/02/2012   Parastomal hernia 11/02/2012   Fibromyalgia 11/02/2012   Generalized anxiety disorder 11/02/2012   Restless legs syndrome 11/02/2012   Class 2 obesity 11/02/2012   Leukocytosis 11/02/2012   Hyperglycemia 11/02/2012   Cigarette nicotine  dependence with withdrawal 11/02/2012   Complete atelectasis 11/02/2012    Past Medical History:  Diagnosis Date   Acid reflux disease    Anxiety    Asthma    Bronchitis, chronic (HCC)    Effects worse with URI   Chronic pain syndrome    COPD (chronic obstructive pulmonary disease) (HCC)    Depression    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, type 2   Diverticulitis    Dysplasia of cervix (uteri)    patient states she had dysplasia of the uterus stage 4   Endometrial cancer (HCC)    Fibromyalgia    Fibromyalgia    GERD (gastroesophageal reflux disease)    Headache(784.0)    migraine headache   Hyperlipidemia    Hypertension    Kidney stones    at least 6 stones in past   Pneumonia 2019   Restless leg syndrome    Bilateral   Vomiting    last night at 2300 after last round of antibiotic    Family History  Problem Relation Age of Onset   Diabetes Mother    Throat cancer Mother    Uterine cancer Sister    Deep vein thrombosis Neg Hx     Pulmonary embolism Neg Hx    Past Surgical History:  Procedure Laterality Date   ABDOMINAL HYSTERECTOMY     APPENDECTOMY     APPLICATION OF WOUND VAC N/A 11/01/2012   Procedure: APPLICATION OF WOUND VAC;  Surgeon: Brandy Cal. Cornett, MD;  Location: [facility];  Service: General;  Laterality: N/A;   CHOLECYSTECTOMY     COLONOSCOPY  2013   COLOSTOMY     COLOSTOMY CLOSURE N/A 11/01/2012   Procedure: COLOSTOMY CLOSURE;  Surgeon: Brandy Cal. Cornett, MD;  Location: [facility];  Service: General;  Laterality: N/A;  REPAIR PARASTOMAL HERNIA; REVISION OF COLOSTOMY   COLOSTOMY CLOSURE N/A 05/03/2013   Procedure: COLOSTOMY CLOSURE;  Surgeon: Brandy Cal. Cornett, MD;  Location: [facility];  Service: General;  Laterality: N/A;   DILATION AND CURETTAGE OF UTERUS N/A 05/24/2019   Procedure: DILATATION AND CURETTAGE;  Surgeon: Retta Caster, MD;  Location: [facility];  Service: Gynecology;  Laterality: N/A;   ESOPHAGOGASTRODUODENOSCOPY Left 11/27/2012   Procedure: ESOPHAGOGASTRODUODENOSCOPY (EGD);  Surgeon: Venson Ginger.,  MD;  Location: [facility];  Service: Gastroenterology;  Laterality: Left;   INCISIONAL HERNIA REPAIR N/A 11/01/2012   Procedure: HERNIA REPAIR INCISIONAL;  Surgeon: Brandy Cal. Cornett, MD;  Location: [facility];  Service: General;  Laterality: N/A;   LAPAROTOMY  11/01/2012   Procedure: EXPLORATORY LAPAROTOMY;  Surgeon: Brandy Cal. Cornett, MD;  Location: [facility];  Service: General;;  exploratory laparotomy,repair of parastomal hernia and incisional hernia, lysis of adhesions, and application of wound V.A.C.   LYSIS OF ADHESION N/A 11/01/2012   Procedure: LYSIS OF ADHESION;  Surgeon: Brandy Cal. Cornett, MD;  Location: [facility];  Service: General;  Laterality: N/A;   OPERATIVE ULTRASOUND N/A 06/14/2019   Procedure: OPERATIVE ULTRASOUND;  Surgeon: Retta Caster, MD;  Location: [facility];  Service: Urology;  Laterality: N/A;   PARASTOMAL HERNIA REPAIR N/A 11/01/2012   Procedure: HERNIA REPAIR PARASTOMAL;  Surgeon: Brandy Cal.  Cornett, MD;  Location: [facility];  Service: General;  Laterality: N/A;   PARTIAL HYSTERECTOMY     partial   RIGHT HEART CATH N/A 07/26/2022   Procedure: RIGHT HEART CATH;  Surgeon: Darlis Eisenmenger, MD;  Location: [facility];  Service: Cardiovascular;  Laterality: N/A;   TANDEM RING INSERTION N/A 05/24/2019   Procedure: INSERTION OF HYMEN CAPSULE;  Surgeon: Retta Caster, MD;  Location: [facility];  Service: Gynecology;  Laterality: N/A;   TANDEM RING INSERTION N/A 06/14/2019   Procedure: HYMEN CAPSULE PLACEMENT;  Surgeon: Retta Caster, MD;  Location: [facility];  Service: Urology;  Laterality: N/A;   Social History   Social History Narrative   Not on file   Immunization History  Administered Date(s) Administered   Influenza,inj,Quad PF,6+ Mos 06/08/2017   Pneumococcal Polysaccharide-23 07/22/2011   Tdap 07/01/2015     Objective: Vital Signs: LMP 04/20/2013    Physical Exam   Musculoskeletal Exam: ***  CDAI Exam: CDAI Score: -- Patient Global: --; Provider Global: -- Swollen: --; Tender: -- Joint Exam 01/25/2024   No joint exam has been documented for this visit   There is currently no information documented on the homunculus. Go to the Rheumatology activity and complete the homunculus joint exam.  Investigation: No additional findings.  Imaging: No results found.  Recent Labs: Lab Results  Component Value Date   WBC 14.8 (H) 10/28/2023   HGB 9.4 (L) 10/28/2023   PLT 327 10/28/2023   NA 140 10/28/2023   K 4.2 10/28/2023   CL 100 10/28/2023   CO2 28 10/28/2023   GLUCOSE 229 (H) 10/28/2023   BUN 37 (H) 10/28/2023   CREATININE 1.35 (H) 10/28/2023   BILITOT 1.9 (H) 07/24/2022   ALKPHOS 141 (H) 07/24/2022   AST 38 07/24/2022   ALT 9 07/24/2022   PROT 7.2 09/16/2023   ALBUMIN  2.4 (L) 07/24/2022   CALCIUM  9.9 10/28/2023   GFRAA >60 11/22/2019    Speciality Comments: No specialty comments available.  Procedures:  No procedures performed Allergies: Tramadol     Assessment / Plan:     Visit Diagnoses: No diagnosis found.  ***  Orders: No orders of the defined types were placed in this encounter.  No orders of the defined types were placed in this encounter.    Follow-Up Instructions: No follow-ups on file.   Glena Landau, RT  Note - This record has been created using [employer].  Chart creation errors have been sought, but may not always  have been located. Such creation errors do not reflect on  the standard of medical care.

## 2024-01-20 ENCOUNTER — Other Ambulatory Visit: Payer: Self-pay | Admitting: Internal Medicine

## 2024-01-20 DIAGNOSIS — M353 Polymyalgia rheumatica: Secondary | ICD-10-CM

## 2024-01-20 NOTE — Telephone Encounter (Signed)
 Contacted the patient to inquire if she is just on the Prednisone  5 mg tablets daily. Patient states she is now taking prednisone  2.5 mg daily by breaking the tablet in half. Patient states she is weaning off and does not need any refills.

## 2024-01-23 ENCOUNTER — Ambulatory Visit: Attending: Internal Medicine | Admitting: Internal Medicine

## 2024-01-23 ENCOUNTER — Encounter: Payer: Self-pay | Admitting: Internal Medicine

## 2024-01-23 VITALS — BP 146/100 | HR 83 | Resp 16 | Ht 63.0 in | Wt 231.4 lb

## 2024-01-23 DIAGNOSIS — M130 Polyarthritis, unspecified: Secondary | ICD-10-CM

## 2024-01-23 DIAGNOSIS — Z7952 Long term (current) use of systemic steroids: Secondary | ICD-10-CM

## 2024-01-23 DIAGNOSIS — M797 Fibromyalgia: Secondary | ICD-10-CM

## 2024-01-23 DIAGNOSIS — M353 Polymyalgia rheumatica: Secondary | ICD-10-CM

## 2024-01-23 DIAGNOSIS — M25552 Pain in left hip: Secondary | ICD-10-CM

## 2024-01-23 MED ORDER — CYCLOBENZAPRINE HCL 10 MG PO TABS
10.0000 mg | ORAL_TABLET | Freq: Every evening | ORAL | 0 refills | Status: DC | PRN
Start: 2024-01-23 — End: 2024-02-21

## 2024-01-23 NOTE — Progress Notes (Signed)
 Office Visit Note  Patient: [name]             Date of Birth: Oct 05, 1960           MRN: [id number]             PCP: Jeannine Milroy., MD Referring: Jeannine Milroy., MD Visit Date: 01/23/2024   Subjective:  Follow-up (Left hip pain that goes to left knee)   Discussed the use of AI scribe software for clinical note transcription with the patient, who gave verbal consent to proceed.  History of Present Illness   [name] is a 63 y.o. female here for follow up with polymyalgia rheumatica on prednisone  with gradual tapering prednisone  from 10 mg to 5 mg since last visit. She complaints of severe left hip pain today.  She experiences severe pain in her hip, described as 'ten plus' in intensity, which began suddenly upon waking. The pain is primarily located on the side and front of her hip, described as 'crushing'. It worsens with standing and walking, but laying down provides some relief. The pain has progressively worsened since onset and does not radiate past the knee. It extends from the side of the hip to the back, reaching a specific point in the back. Increased tenderness is noted in certain areas when pressure is applied.  She has been tapering her prednisone  dose, currently at 2.5 mg, and reports increased stiffness since the dose reduction. However, the severe pain did not coincide directly with the dose change, as she has been on the lower dose for a couple of weeks. She has a history of polymyalgia rheumatica, which is managed with prednisone .  She has a history of using Flexeril , a muscle relaxer, without adverse effects. She also reports difficulty sleeping due to the pain and requires assistance from her mother for tasks such as removing her shirt due to limited shoulder mobility, although the shoulders are not painful.  No new symptoms in her shoulders, although she has difficulty with overhead movements. She confirms that the pain does not extend  down the back of her leg.    Previous HPI 10/28/2023 [name] is a 63 year old female with polymyalgia rheumatica on prednisone  with gradual tapering now at 10 mg once daily. She presents with increased stiffness in the neck and shoulders.   She has experienced increased stiffness in her neck and shoulders for the past couple of weeks, since approximately January. The stiffness has worsened following a decrease in her prednisone  dosage. It is present throughout the day and does not radiate to other areas. No numbness, tingling, or swelling is associated with the stiffness.   She has been on prednisone  for several weeks without difficulty. Her inflammation markers, including sedimentation rate and C-reactive protein, were previously elevated but have shown improvement.   She describes shoulder pain as manageable, with the right side being worse than the left. Pain occurs with certain movements, causing pressure but no sharp pain. X-rays indicate narrowing of the acromioclavicular joint space, suggesting osteoarthritis and possible chronic rotator cuff inflammation.     Previous HPI 09/16/23 [name] is a 63 y.o. female here for evaluation of bilateral joint pain and stiffness, predominantly affecting the shoulders and hands and high sedimentation rate. The symptoms began approximately four months ago, initially presenting as a sensation of a pulled muscle in the shoulder, progressing to the point where the patient was unable to lift their arm. Concurrently,  the patient noticed increasing difficulty in making a fist, more so in one hand than the other. The symptoms were described as painful, with no visible swelling or color changes noted. The discomfort and limited mobility were consistent throughout the day, with no relief or worsening at any particular time. The patient denied any lower extremity involvement.   Despite initial advice to continue using the affected joints, the  patient's symptoms persisted, leading to a loss of function in the arms and hands. The patient also reported dropping objects and difficulty opening pill bottles. Numbness in the hands was noted during this period, but has since resolved.   The patient was started on prednisone  20 mg following blood work that showed a high sedimentation rate. Improvement in symptoms was noted after approximately three days of steroid therapy, with continued gradual improvement since then. The patient has been on prednisone  consistently for about two months. The main side effect noted was worsening of diabetes control.   She has a history of fibromyalgia but does not feel current symptoms are typical of her chronic muscle or joint pains. The patient has no history of similar episodes, injuries, or surgeries in the neck or shoulders. The patient has not had any imaging studies of the affected joints.    Labs reviewed 06/2023 ESR 65 CK 74   07/2022 RF 24.3 ANA neg   Review of Systems  Constitutional:  Positive for fatigue.  HENT:  Positive for mouth dryness. Negative for mouth sores.   Eyes:  Negative for dryness.  Respiratory:  Negative for shortness of breath.   Cardiovascular:  Negative for chest pain and palpitations.  Gastrointestinal:  Negative for blood in stool, constipation and diarrhea.  Endocrine: Negative for increased urination.  Genitourinary:  Negative for involuntary urination.  Musculoskeletal:  Positive for joint pain, joint pain, myalgias, morning stiffness and myalgias. Negative for gait problem, joint swelling, muscle weakness and muscle tenderness.  Skin:  Negative for color change, rash, hair loss and sensitivity to sunlight.  Allergic/Immunologic: Negative for susceptible to infections.  Neurological:  Negative for dizziness and headaches.  Hematological:  Negative for swollen glands.  Psychiatric/Behavioral:  Positive for depressed mood and sleep disturbance. The patient is  nervous/anxious.     PMFS History:  Patient Active Problem List   Diagnosis Date Noted   PMR (polymyalgia rheumatica) (HCC) 01/23/2024   Lateral pain of left hip 01/23/2024   Polyarthritis 09/16/2023   Bilateral shoulder pain 09/16/2023   Paroxysmal atrial fibrillation (HCC) 07/27/2022   CHF (congestive heart failure), NYHA class III, acute, systolic (HCC) 07/23/2022   Acute on chronic diastolic CHF (congestive heart failure) (HCC) 07/23/2022   Type 2 diabetes mellitus with hyperlipidemia (HCC) 01/09/2019   Essential hypertension 12/12/2018   Hyperlipidemia 12/12/2018   Preoperative clearance 12/12/2018   Endometrial cancer (HCC) 11/15/2018   Incisional hernia, without obstruction or gangrene 02/04/2014   Post-op pain 07/23/2013   Postoperative wound infection 05/25/2013   Wound disruption, post-op, skin 01/30/2013   Preoperative respiratory examination 12/18/2012   Smoking 12/18/2012   Heme positive stool 11/25/2012   Hypokalemia 11/25/2012   COPD (chronic obstructive pulmonary disease) (HCC)    Acid reflux disease    Post-operative state 11/21/2012   AKI (acute kidney injury) (HCC) 11/04/2012   HAP (hospital-acquired pneumonia) 11/03/2012   Acute pulmonary edema (HCC) 11/03/2012   Acute respiratory failure with hypoxia (HCC) 11/02/2012   Acute exacerbation of COPD with asthma (HCC) 11/02/2012   Parastomal hernia 11/02/2012   Fibromyalgia 11/02/2012  Generalized anxiety disorder 11/02/2012   Restless legs syndrome 11/02/2012   Class 2 obesity 11/02/2012   Leukocytosis 11/02/2012   Hyperglycemia 11/02/2012   Cigarette nicotine  dependence with withdrawal 11/02/2012   Complete atelectasis 11/02/2012    Past Medical History:  Diagnosis Date   Acid reflux disease    Anxiety    Asthma    Bronchitis, chronic (HCC)    Effects worse with URI   Chronic pain syndrome    COPD (chronic obstructive pulmonary disease) (HCC)    Depression    Diabetes mellitus without  complication (HCC)    borderline, type 2   Diverticulitis    Dysplasia of cervix (uteri)    patient states she had dysplasia of the uterus stage 4   Endometrial cancer (HCC)    Fibromyalgia    Fibromyalgia    GERD (gastroesophageal reflux disease)    Headache(784.0)    migraine headache   Hyperlipidemia    Hypertension    Kidney stones    at least 6 stones in past   Pneumonia 2019   Restless leg syndrome    Bilateral   Vomiting    last night at 2300 after last round of antibiotic    Family History  Problem Relation Age of Onset   Diabetes Mother    Throat cancer Mother    Uterine cancer Sister    Deep vein thrombosis Neg Hx    Pulmonary embolism Neg Hx    Past Surgical History:  Procedure Laterality Date   ABDOMINAL HYSTERECTOMY     APPENDECTOMY     APPLICATION OF WOUND VAC N/A 11/01/2012   Procedure: APPLICATION OF WOUND VAC;  Surgeon: Brandy Cal. Cornett, MD;  Location: [facility];  Service: General;  Laterality: N/A;   CHOLECYSTECTOMY     COLONOSCOPY  2013   COLOSTOMY     COLOSTOMY CLOSURE N/A 11/01/2012   Procedure: COLOSTOMY CLOSURE;  Surgeon: Brandy Cal. Cornett, MD;  Location: [facility];  Service: General;  Laterality: N/A;  REPAIR PARASTOMAL HERNIA; REVISION OF COLOSTOMY   COLOSTOMY CLOSURE N/A 05/03/2013   Procedure: COLOSTOMY CLOSURE;  Surgeon: Brandy Cal. Cornett, MD;  Location: [facility];  Service: General;  Laterality: N/A;   DILATION AND CURETTAGE OF UTERUS N/A 05/24/2019   Procedure: DILATATION AND CURETTAGE;  Surgeon: Retta Caster, MD;  Location: [facility];  Service: Gynecology;  Laterality: N/A;   ESOPHAGOGASTRODUODENOSCOPY Left 11/27/2012   Procedure: ESOPHAGOGASTRODUODENOSCOPY (EGD);  Surgeon: Venson Ginger., MD;  Location: [facility];  Service: Gastroenterology;  Laterality: Left;   INCISIONAL HERNIA REPAIR N/A 11/01/2012   Procedure: HERNIA REPAIR INCISIONAL;  Surgeon: Brandy Cal. Cornett, MD;  Location: [facility];  Service: General;  Laterality: N/A;   LAPAROTOMY   11/01/2012   Procedure: EXPLORATORY LAPAROTOMY;  Surgeon: Brandy Cal. Cornett, MD;  Location: [facility];  Service: General;;  exploratory laparotomy,repair of parastomal hernia and incisional hernia, lysis of adhesions, and application of wound V.A.C.   LYSIS OF ADHESION N/A 11/01/2012   Procedure: LYSIS OF ADHESION;  Surgeon: Brandy Cal. Cornett, MD;  Location: [facility];  Service: General;  Laterality: N/A;   OPERATIVE ULTRASOUND N/A 06/14/2019   Procedure: OPERATIVE ULTRASOUND;  Surgeon: Retta Caster, MD;  Location: [facility];  Service: Urology;  Laterality: N/A;   PARASTOMAL HERNIA REPAIR N/A 11/01/2012   Procedure: HERNIA REPAIR PARASTOMAL;  Surgeon: Brandy Cal. Cornett, MD;  Location: [facility];  Service: General;  Laterality: N/A;   PARTIAL HYSTERECTOMY     partial   RIGHT HEART  CATH N/A 07/26/2022   Procedure: RIGHT HEART CATH;  Surgeon: Darlis Eisenmenger, MD;  [facility];  Service: Cardiovascular;  Laterality: N/A;   TANDEM RING INSERTION N/A 05/24/2019   Procedure: INSERTION OF HYMEN CAPSULE;  Surgeon: Retta Caster, MD;  Location: [facility];  Service: Gynecology;  Laterality: N/A;   TANDEM RING INSERTION N/A 06/14/2019   Procedure: HYMEN CAPSULE PLACEMENT;  Surgeon: Retta Caster, MD;  Location: [facility];  Service: Urology;  Laterality: N/A;   Social History   Social History Narrative   Not on file   Immunization History  Administered Date(s) Administered   Influenza,inj,Quad PF,6+ Mos 06/08/2017   Pneumococcal Polysaccharide-23 07/22/2011   Tdap 07/01/2015     Objective: Vital Signs: BP (!) 146/100 (BP Location: Left Arm, Patient Position: Sitting, Cuff Size: Normal) Comment (BP Location): lower  Pulse 83   Resp 16   Ht 5\' 3"  (1.6 m)   Wt 231 lb 6.4 oz (105 kg)   LMP 04/20/2013   BMI 40.99 kg/m    Physical Exam Cardiovascular:     Rate and Rhythm: Normal rate and regular rhythm.  Pulmonary:     Effort: Pulmonary effort is normal.     Breath sounds: Normal breath sounds.   Skin:    General: Skin is warm and dry.  Neurological:     Mental Status: She is alert.  Psychiatric:        Mood and Affect: Mood normal.      Musculoskeletal Exam:  Shoulders hard to raise overhead bilaterally Elbows full ROM no tenderness or swelling Wrists full ROM no tenderness or swelling Fingers full ROM no tenderness or swelling No paraspinal tenderness to palpation over upper and lower back Left lateral hip tenderness to pressure some lateral pain provoked with FABER/FADIR Knees full ROM no tenderness or swelling  Investigation: No additional findings.  Imaging: No results found.  Recent Labs: Lab Results  Component Value Date   WBC 14.8 (H) 10/28/2023   HGB 9.4 (L) 10/28/2023   PLT 327 10/28/2023   NA 140 10/28/2023   K 4.2 10/28/2023   CL 100 10/28/2023   CO2 28 10/28/2023   GLUCOSE 229 (H) 10/28/2023   BUN 37 (H) 10/28/2023   CREATININE 1.35 (H) 10/28/2023   BILITOT 1.9 (H) 07/24/2022   ALKPHOS 141 (H) 07/24/2022   AST 38 07/24/2022   ALT 9 07/24/2022   PROT 7.2 09/16/2023   ALBUMIN  2.4 (L) 07/24/2022   CALCIUM  9.9 10/28/2023   GFRAA >60 11/22/2019    Speciality Comments: No specialty comments available.  Procedures:  No procedures performed Allergies: Tramadol    Assessment / Plan:     Visit Diagnoses: Polyarthritis  PMR (polymyalgia rheumatica) (HCC) - Plan: Sedimentation rate, C-reactive protein Increase shoulder and hip pain especially bilateral shoulder involvement could indicate PMR activity though not specific.  Recheck sed rate CRP for evaluation.  If severely elevated would resume higher steroid dose or resume tapering.  Lateral pain of left hip - Plan: cyclobenzaprine  (FLEXERIL ) 10 MG tablet Hip bursitis or tendonitis Suspected hip bursitis or tendonitis with severe pain localized to the side and front of the hip. Differential diagnosis includes IT band syndrome, but less likely due to pain location. Unlikely nerve-related or  bone-on-bone arthritis. Current low-dose oral steroid may not suffice for inflammation control. Discussed treatment options including oral steroids, injections, and muscle relaxers. Emphasized stretching exercises and monitoring inflammation through lab tests. - Recheck labs to assess inflammation levels after prednisone  reduction. -  Provide stretching exercises for hip pain management. - Consider muscle relaxer, such as Flexeril , especially at night for pain relief. - Evaluate the need for a steroid injection in the hip if pain does not improve.  Fibromyalgia Difficulty sleeping Difficulty sleeping likely secondary to hip pain. Discussed the use of muscle relaxers, such as Flexeril , to aid in sleep by alleviating hip pain. - Consider Flexeril  at night to improve sleep quality.        Orders: Orders Placed This Encounter  Procedures   Sedimentation rate   C-reactive protein   CBC with Differential/Platelet   Basic Metabolic Panel (BMET)   Meds ordered this encounter  Medications   cyclobenzaprine  (FLEXERIL ) 10 MG tablet    Sig: Take 1 tablet (10 mg total) by mouth at bedtime as needed.    Dispense:  30 tablet    Refill:  0     Follow-Up Instructions: Return in about 3 months (around 04/24/2024) for PMR on GC.   Matt Song, MD  Note - This record has been created using [employer].  Chart creation errors have been sought, but may not always  have been located. Such creation errors do not reflect on  the standard of medical care.

## 2024-01-24 LAB — BASIC METABOLIC PANEL WITH GFR
BUN: 21 mg/dL (ref 7–25)
CO2: 26 mmol/L (ref 20–32)
Calcium: 8.4 mg/dL — ABNORMAL LOW (ref 8.6–10.4)
Chloride: 102 mmol/L (ref 98–110)
Creat: 1.01 mg/dL (ref 0.50–1.05)
Glucose, Bld: 152 mg/dL — ABNORMAL HIGH (ref 65–99)
Potassium: 4.4 mmol/L (ref 3.5–5.3)
Sodium: 140 mmol/L (ref 135–146)
eGFR: 62 mL/min/{1.73_m2} (ref >=60)

## 2024-01-24 LAB — CBC WITH DIFFERENTIAL/PLATELET
Absolute Lymphocytes: 8004 {cells}/uL — ABNORMAL HIGH (ref 850–3900)
Absolute Monocytes: 642 {cells}/uL (ref 200–950)
Basophils Absolute: 39 {cells}/uL (ref 0–200)
Basophils Relative: 0.3 %
Eosinophils Absolute: 66 {cells}/uL (ref 15–500)
Eosinophils Relative: 0.5 %
HCT: 29.4 % — ABNORMAL LOW (ref 35.0–45.0)
Hemoglobin: 8.7 g/dL — ABNORMAL LOW (ref 11.7–15.5)
MCH: 22.5 pg — ABNORMAL LOW (ref 27.0–33.0)
MCHC: 29.6 g/dL — ABNORMAL LOW (ref 32.0–36.0)
MCV: 76.2 fL — ABNORMAL LOW (ref 80.0–100.0)
MPV: 10.2 fL (ref 7.5–12.5)
Monocytes Relative: 4.9 %
Neutro Abs: 4349 {cells}/uL (ref 1500–7800)
Neutrophils Relative %: 33.2 %
Platelets: 234 10*3/uL (ref 140–400)
RBC: 3.86 10*6/uL (ref 3.80–5.10)
RDW: 18.4 % — ABNORMAL HIGH (ref 11.0–15.0)
Total Lymphocyte: 61.1 %
WBC: 13.1 10*3/uL — ABNORMAL HIGH (ref 3.8–10.8)

## 2024-01-24 LAB — SEDIMENTATION RATE: Sed Rate: 51 mm/h — ABNORMAL HIGH (ref 0–30)

## 2024-01-24 LAB — C-REACTIVE PROTEIN: CRP: 43.6 mg/L — ABNORMAL HIGH (ref <8.0)

## 2024-01-25 ENCOUNTER — Other Ambulatory Visit: Payer: Self-pay | Admitting: Internal Medicine

## 2024-01-25 ENCOUNTER — Ambulatory Visit: Payer: PPO | Admitting: Internal Medicine

## 2024-01-25 DIAGNOSIS — M353 Polymyalgia rheumatica: Secondary | ICD-10-CM

## 2024-01-25 DIAGNOSIS — Z7952 Long term (current) use of systemic steroids: Secondary | ICD-10-CM

## 2024-01-25 DIAGNOSIS — G8929 Other chronic pain: Secondary | ICD-10-CM

## 2024-01-25 DIAGNOSIS — I480 Paroxysmal atrial fibrillation: Secondary | ICD-10-CM

## 2024-02-01 ENCOUNTER — Other Ambulatory Visit (HOSPITAL_COMMUNITY): Payer: Self-pay | Admitting: Psychiatry

## 2024-02-01 DIAGNOSIS — F331 Major depressive disorder, recurrent, moderate: Secondary | ICD-10-CM

## 2024-02-01 DIAGNOSIS — F4310 Post-traumatic stress disorder, unspecified: Secondary | ICD-10-CM

## 2024-02-01 DIAGNOSIS — F411 Generalized anxiety disorder: Secondary | ICD-10-CM

## 2024-02-01 MED ORDER — PREDNISONE 5 MG PO TABS: 5.0000 mg | ORAL_TABLET | Freq: Every day | ORAL | 0 refills | Status: DC

## 2024-02-16 ENCOUNTER — Other Ambulatory Visit (HOSPITAL_COMMUNITY): Payer: Self-pay | Admitting: Cardiology

## 2024-02-19 ENCOUNTER — Other Ambulatory Visit: Payer: Self-pay | Admitting: Internal Medicine

## 2024-02-19 DIAGNOSIS — M25552 Pain in left hip: Secondary | ICD-10-CM

## 2024-02-20 NOTE — Telephone Encounter (Signed)
 Last Fill: 01/23/2024  Next Visit: 04/24/2024  Last Visit: 01/23/2024  Dx: Lateral pain of left hip   Current Dose per office note on 01/23/2024: not mentioned  Okay to refill Flexeril ?

## 2024-02-28 ENCOUNTER — Other Ambulatory Visit: Payer: Self-pay | Admitting: Internal Medicine

## 2024-02-28 DIAGNOSIS — M353 Polymyalgia rheumatica: Secondary | ICD-10-CM

## 2024-02-28 NOTE — Telephone Encounter (Signed)
 Last Fill: 02/01/2024  Next Visit: 04/24/2024  Last Visit: 01/23/2024  Dx: PMR (polymyalgia rheumatica) (HCC)   Current Dose per office note on 01/23/2024: If severely elevated would resume higher steroid dose or resume tapering.   Okay to refill Prednisone ?

## 2024-03-05 ENCOUNTER — Telehealth (HOSPITAL_COMMUNITY): Payer: Self-pay

## 2024-03-05 NOTE — Telephone Encounter (Signed)
 Called to confirm/remind patient of their appointment at the [facility] on 03/06/24.   Appointment:   [x] Confirmed  [] Left mess   [] No answer/No voice mail  [] VM Full/unable to leave message  [] Phone not in service  Patient reminded to bring all medications and/or complete list.  Confirmed patient has transportation. Gave directions, instructed to utilize valet parking.

## 2024-03-06 ENCOUNTER — Encounter (HOSPITAL_COMMUNITY): Payer: Self-pay

## 2024-03-06 ENCOUNTER — Ambulatory Visit (HOSPITAL_COMMUNITY)
Admission: RE | Admit: 2024-03-06 | Discharge: 2024-03-06 | Disposition: A | Discharge status: Home / Self Care | Payer: PPO | Source: Ambulatory Visit | Attending: Cardiology | Admitting: Cardiology

## 2024-03-06 VITALS — BP 110/62 | HR 89 | Ht 63.0 in | Wt 235.4 lb

## 2024-03-06 DIAGNOSIS — E119 Type 2 diabetes mellitus without complications: Secondary | ICD-10-CM | POA: Insufficient documentation

## 2024-03-06 DIAGNOSIS — K7460 Unspecified cirrhosis of liver: Secondary | ICD-10-CM | POA: Diagnosis not present

## 2024-03-06 DIAGNOSIS — E669 Obesity, unspecified: Secondary | ICD-10-CM | POA: Diagnosis not present

## 2024-03-06 DIAGNOSIS — I110 Hypertensive heart disease with heart failure: Secondary | ICD-10-CM | POA: Diagnosis not present

## 2024-03-06 DIAGNOSIS — M130 Polyarthritis, unspecified: Secondary | ICD-10-CM | POA: Diagnosis not present

## 2024-03-06 DIAGNOSIS — J849 Interstitial pulmonary disease, unspecified: Secondary | ICD-10-CM | POA: Diagnosis not present

## 2024-03-06 DIAGNOSIS — Z87891 Personal history of nicotine dependence: Secondary | ICD-10-CM | POA: Insufficient documentation

## 2024-03-06 DIAGNOSIS — M797 Fibromyalgia: Secondary | ICD-10-CM | POA: Diagnosis not present

## 2024-03-06 DIAGNOSIS — I491 Atrial premature depolarization: Secondary | ICD-10-CM | POA: Insufficient documentation

## 2024-03-06 DIAGNOSIS — G894 Chronic pain syndrome: Secondary | ICD-10-CM | POA: Insufficient documentation

## 2024-03-06 DIAGNOSIS — I50810 Right heart failure, unspecified: Secondary | ICD-10-CM | POA: Insufficient documentation

## 2024-03-06 DIAGNOSIS — I272 Pulmonary hypertension, unspecified: Secondary | ICD-10-CM | POA: Diagnosis not present

## 2024-03-06 DIAGNOSIS — Z79899 Other long term (current) drug therapy: Secondary | ICD-10-CM | POA: Diagnosis not present

## 2024-03-06 DIAGNOSIS — Z794 Long term (current) use of insulin: Secondary | ICD-10-CM | POA: Diagnosis not present

## 2024-03-06 DIAGNOSIS — J439 Emphysema, unspecified: Secondary | ICD-10-CM | POA: Insufficient documentation

## 2024-03-06 DIAGNOSIS — I480 Paroxysmal atrial fibrillation: Secondary | ICD-10-CM | POA: Insufficient documentation

## 2024-03-06 DIAGNOSIS — Z833 Family history of diabetes mellitus: Secondary | ICD-10-CM | POA: Diagnosis not present

## 2024-03-06 DIAGNOSIS — I5032 Chronic diastolic (congestive) heart failure: Secondary | ICD-10-CM | POA: Diagnosis not present

## 2024-03-06 DIAGNOSIS — Z7952 Long term (current) use of systemic steroids: Secondary | ICD-10-CM | POA: Insufficient documentation

## 2024-03-06 DIAGNOSIS — J9611 Chronic respiratory failure with hypoxia: Secondary | ICD-10-CM | POA: Insufficient documentation

## 2024-03-06 DIAGNOSIS — Z7901 Long term (current) use of anticoagulants: Secondary | ICD-10-CM | POA: Diagnosis not present

## 2024-03-06 LAB — CBC
HCT: 33.5 % — ABNORMAL LOW (ref 36.0–46.0)
Hemoglobin: 9.6 g/dL — ABNORMAL LOW (ref 12.0–15.0)
MCH: 22.9 pg — ABNORMAL LOW (ref 26.0–34.0)
MCHC: 28.7 g/dL — ABNORMAL LOW (ref 30.0–36.0)
MCV: 79.8 fL — ABNORMAL LOW (ref 80.0–100.0)
Platelets: 327 10*3/uL (ref 150–400)
RBC: 4.2 MIL/uL (ref 3.87–5.11)
RDW: 17.3 % — ABNORMAL HIGH (ref 11.5–15.5)
WBC: 12.3 10*3/uL — ABNORMAL HIGH (ref 4.0–10.5)
nRBC: 0 % (ref 0.0–0.2)

## 2024-03-06 LAB — BASIC METABOLIC PANEL WITH GFR
Anion gap: 12 (ref 5–15)
BUN: 25 mg/dL — ABNORMAL HIGH (ref 8–23)
CO2: 27 mmol/L (ref 22–32)
Calcium: 9.4 mg/dL (ref 8.9–10.3)
Chloride: 98 mmol/L (ref 98–111)
Creatinine, Ser: 1.36 mg/dL — ABNORMAL HIGH (ref 0.44–1.00)
GFR, Estimated: 43 mL/min — ABNORMAL LOW (ref >60)
Glucose, Bld: 174 mg/dL — ABNORMAL HIGH (ref 70–99)
Potassium: 5.2 mmol/L — ABNORMAL HIGH (ref 3.5–5.1)
Sodium: 137 mmol/L (ref 135–145)

## 2024-03-06 LAB — BRAIN NATRIURETIC PEPTIDE: B Natriuretic Peptide: 58.8 pg/mL (ref 0.0–100.0)

## 2024-03-06 MED ORDER — TORSEMIDE 40 MG PO TABS: 40.0000 mg | ORAL_TABLET | Freq: Every day | ORAL | 1 refills | Status: DC

## 2024-03-06 NOTE — Patient Instructions (Addendum)
 Good to see you today!  INCREASE torsemide  to 40 mg daily  Labs done today, your results will be available in MyChart, we will contact you for abnormal readings.  Your physician recommends that you schedule a follow-up appointment 4 weeks as scheduled  If you have any questions or concerns before your next appointment please send us  a message through [place] or call our office at [phone].    TO LEAVE A MESSAGE FOR THE NURSE SELECT OPTION 2, PLEASE LEAVE A MESSAGE INCLUDING: YOUR NAME DATE OF BIRTH CALL BACK NUMBER REASON FOR CALL**this is important as we prioritize the call backs  YOU WILL RECEIVE A CALL BACK THE SAME DAY AS LONG AS YOU CALL BEFORE 4:00 PM At the [facility], you and your health needs are our priority. As part of our continuing mission to provide you with exceptional heart care, we have created designated Provider Care Teams. These Care Teams include your primary Cardiologist (physician) and Advanced Practice Providers (APPs- Physician Assistants and Nurse Practitioners) who all work together to provide you with the care you need, when you need it.   You may see any of the following providers on your designated Care Team at your next follow up: Dr Jules Oar Dr Peder Bourdon Dr. Alwin Baars Dr. Arta Lark Amy Marijane Shoulders, NP Ruddy Corral, [place] [facility] [place], [place] Dennise Fitz, NP [place] Lee, NP Shawnee Dellen, NP Luster Salters, PharmD Bevely Brush, PharmD   Please be sure to bring in all your medications bottles to every appointment.    Thank you for choosing [facility]

## 2024-03-06 NOTE — Progress Notes (Signed)
 Primary Care: Jeannine Milroy., MD Pulmonary: Dr Elverna Hamman  HF Cardiologist: Dr. Mitzie Anda  F/u for HFpEF   HPI: 63 y.o. female smoker w/ h/o COPD, HTN, HLD, Type 2 DM, chronic pain syndrome/ fibromyalgia and pulmonary hypertension with RV failure/cor pulmonale.  She was admitted 11/23 with respiratory distress, treated with BiPAP. SCr elevated and unable to get CT chest. Echo showed EF 60-65%, severe RV failure and possible McConnell's sign, RA mod dilated, no MR. Felt respiratory distress related to CHF/RV failure. She was diuresed with IV lasix . RUQ US  limited due to patient habitus but suggestive of possible liver disease/cirrhosis (coarsened hepatic echotexture). She underwent RHC showing low PCWP and elevated right heart pressures, suggesting cor pulmonale. V/Q scan negative for PE. She had a significant oxygen  requirement and lung parenchymal disease felt to be significant. Continued to treat with steroids and oxygen .  GDMT initiated but limited by need for low-dose midodrine . She was discharged home on oxygen , weight 189 lbs.  Zio monitor in 1/24 showed multiple SVT episodes, possible atrial tachycardia, no atrial fibrillation.  Bisoprolol  was started.   CT chest in 2/24 showed possible smoking-related ILD (respiratory bronchiolitis).    Echo 12/24 EF 60-65%, normal RV, IVC normal, unable to estimate PA systolic pressure.   She quit smoking, 11/23.   Of note, no longer on Tyvaso . She stopped Tyvaso  during a severe URI.  When she tried to start it back, she developed severe headaches and worsening flu-like symptoms, so she stayed off it.  She does not feel any worse off Tyvaso .  In fact, she has improved a lot since quitting cigarettes.    She presents today for 6 month f/u. Doing fairly well. Reports stable NYHA class II symptoms. Rarely has to use home O2 (uses PRN). She has had progressive 15 lb wt gain over the last 4 months. She has been on daily prednisone , per her PCP and  rheumatologist, for polyarthritis. She reports increase in caloric intake. She reports compliance w/ torsemide  20 mg daily but says that UOP is not robust.   She is maintaining NSR on EKG. Reports compliance w/ Eliquis . No abnormal bleeding.   BP well controlled.    Wt Readings from Last 3 Encounters:  03/06/24 106.8 kg (235 lb 6.4 oz)  01/23/24 105 kg (231 lb 6.4 oz)  10/28/23 97.5 kg (215 lb)      ECG (personally reviewed): NSR 87 bpm   Labs (11/23): K 4.0, creatinine 1.08 Labs (12/23): K 3.5, creatinine 0.99 Labs (3/24): K 4.0, creatinine 1.46 Labs (6/24): K 4.3, creatinine 1.13 Labs (9/24): BNP 33, K 4, creatinine 1.14  6 minute walk (3/24): 244 m 6 minute walk (9/24): 305 m  PMH: 1. COPD: Prior smoking.  2. 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   - CT chest (2/24) with suspected respiratory bronchiolitis-related ILD.  3. OSA: Cannot tolerate CPAP.  4. Atrial fibrillation: Paroxysmal.  5. Atrial tachycardia: Zio monitor in 1/24 showed multiple SVT episodes, possible atrial tachycardia, no atrial fibrillation.  6. RV failiure/cor pulmonale:  - Echo (11/23): EF 60-65%, grade I DD,  RV severely reduced systolic function.  - RHC (11/23): RA mean 11, PA 44/19 mean 28, PCWP mean 4, CO/CI (Fick) 5.46/2.65, PVR 4.4, PAPi 2.3  - V/Q scan (11/23): No evidence for chronic PE.  - Echo (12/24): EF 60-65%, normal RV, IVC normal, unable to estimate PA systolic pressure.  7. Cirrhosis: Possibly due to RV failure.  8. 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 HTN 10. Hyperlipidemia 11. Fibromyalgia 12. H/o endometrial cancer 13. H/o nephrolithiasis  Current Outpatient Medications  Medication Sig Dispense Refill   acetaminophen  (TYLENOL ) 500 MG tablet Take 500 mg by mouth every 6 (six) hours as needed.     apixaban  (ELIQUIS ) 5 MG TABS tablet Take 1 tablet (5 mg total) by mouth 2 (two) times daily. 60 tablet 0   atorvastatin  (LIPITOR) 40 MG tablet Take 40  mg by mouth at bedtime.      bisoprolol  (ZEBETA ) 5 MG tablet TAKE 1 TABLET (5 MG TOTAL) BY MOUTH DAILY. 90 tablet 3   clonazePAM  (KLONOPIN ) 0.25 MG disintegrating tablet Take 1 tablet (0.25 mg total) by mouth 2 (two) times daily as needed (anxiety). 60 tablet 2   Continuous Glucose Sensor (FREESTYLE LIBRE 2 SENSOR) MISC USE AS DIRECTED TO MONITOR GLUCOSE CONTINUOUSLY. CHANGE EVERY 14 DAYS. 84 DAYS (Patient not taking: Reported on 01/23/2024)     cyclobenzaprine  (FLEXERIL ) 10 MG tablet TAKE 1 TABLET BY MOUTH AT BEDTIME AS NEEDED. 30 tablet 0   dicyclomine  (BENTYL ) 10 MG capsule Take 10 mg by mouth every 8 (eight) hours as needed for spasms (abdominal spasms).     doxepin  (SINEQUAN ) 100 MG capsule Take 1 capsule (100 mg total) by mouth at bedtime. 30 capsule 2   FLUoxetine  (PROZAC ) 40 MG capsule TAKE 1 CAPSULE BY MOUTH EVERY DAY 30 capsule 2   Insulin  Degludec (TRESIBA [place]) Inject 68 Units into the skin daily.     Insulin  Pen Needle (BD PEN NEEDLE NANO U/F) 32G X 4 MM MISC use daily with insulin  for 90 days     ipratropium-albuterol  (DUONEB) 0.5-2.5 (3) MG/3ML SOLN Use 1 vial by nebulization every 6 (six) hours as needed. (Patient not taking: Reported on 01/23/2024) 360 mL 0   Lancets (ONETOUCH DELICA PLUS LANCET33G) MISC as directed to SMBG three times daily for 90 days     omeprazole  (PRILOSEC) 40 MG capsule Take 40 mg by mouth daily before breakfast.   6   ONETOUCH VERIO test strip CHECK SUGARS TWICE A DAY DX  DM2, INSULIN  DEPENDANT     predniSONE  (DELTASONE ) 5 MG tablet TAKE 1 TABLET BY MOUTH EVERY DAY WITH BREAKFAST 30 tablet 1   Semaglutide,0.25 or 0.5MG /DOS, (OZEMPIC, 0.25 OR 0.5 MG/DOSE,) 2 MG/3ML SOPN Inject 1 Units into the skin once a week. (Patient not taking: Reported on 05/26/2023)     spironolactone  (ALDACTONE ) 25 MG tablet Take 0.5 tablets (12.5 mg total) by mouth daily. 30 tablet 0   torsemide  (DEMADEX ) 20 MG tablet Take 1 tablet (20 mg total) by mouth daily. 30 tablet 0   VENTOLIN  HFA 108  (90 BASE) MCG/ACT inhaler Inhale 2 puffs into the lungs every 6 (six) hours as needed for wheezing or shortness of breath.  (Patient not taking: Reported on 09/16/2023)     ziprasidone  (GEODON ) 20 MG capsule Take 1 capsule (20 mg total) by mouth daily after breakfast. 30 capsule 2   No current facility-administered medications for this visit.   Allergies  Allergen Reactions   Tramadol      Per Dr. Judd Northern is renal failure, patient states that she is currently taking this medication and she has not had a reaction to Tramadol  since this time.   Social History   Socioeconomic History   Marital status: Married    Spouse name: Not on file   Number of children: Not on file   Years of education: Not on file   Highest education  level: Not on file  Occupational History   Not on file  Tobacco Use   Smoking status: Former    Current packs/day: 0.00    Types: Cigarettes    Quit date: 2010    Years since quitting: 15.4   Smokeless tobacco: Never   Tobacco comments:    about one pack a day on a bad day  Vaping Use   Vaping status: Former  Substance and Sexual Activity   Alcohol  use: No   Drug use: No   Sexual activity: Never    Birth control/protection: None  Other Topics Concern   Not on file  Social History Narrative   Not on file   Social Drivers of Health   Financial Resource Strain: Not on file  Food Insecurity: No Food Insecurity (07/25/2022)   Hunger Vital Sign    Worried About Running Out of Food in the Last Year: Never true    Ran Out of Food in the Last Year: Never true  Transportation Needs: No Transportation Needs (07/25/2022)   PRAPARE - Administrator, Civil Service (Medical): No    Lack of Transportation (Non-Medical): No  Physical Activity: Not on file  Stress: Not on file  Social Connections: Not on file  Intimate Partner Violence: Not At Risk (07/25/2022)   Humiliation, Afraid, Rape, and Kick questionnaire    Fear of Current or  Ex-Partner: No    Emotionally Abused: No    Physically Abused: No    Sexually Abused: No   Family History  Problem Relation Age of Onset   Diabetes Mother    Throat cancer Mother    Uterine cancer Sister    Deep vein thrombosis Neg Hx    Pulmonary embolism Neg Hx    ROS: All systems reviewed and negative except as per HPI.  LMP 04/20/2013   Wt Readings from Last 3 Encounters:  01/23/24 105 kg (231 lb 6.4 oz)  10/28/23 97.5 kg (215 lb)  09/16/23 97.1 kg (214 lb)   PHYSICAL EXAM: General:  obese. No respiratory difficulty HEENT: dentition in poor repair, otherwise normal Neck: supple. JVD 8-9 cm Carotids 2+ bilat; no bruits. No lymphadenopathy or thyromegaly appreciated. Cor: PMI nondisplaced. Regular rate & rhythm. No rubs, gallops or murmurs. Lungs: clear Abdomen: obese, + distended. nontender, nondistended.  Extremities: no cyanosis, clubbing, rash, edema Neuro: alert & oriented x 3, cranial nerves grossly intact. moves all 4 extremities w/o difficulty. Affect pleasant.   ASSESSMENT & PLAN: 1. RV failure/cor pulmonale: Echo 11/23 showed EF 60-65%, D-shaped septum, moderate RV enlargement with severely decreased RV systolic function, IVC dilated. RHC with low PCWP, elevated right-sided filling pressures, mild PAH, preserved cardiac output. Suspect RV failure due to parenchymal lung disease (emphysema, smoking-related ILD noted on HRCT 4/23). She is much improved symptomatically, NYHA class I-II and off oxygen .  Echo today showed EF 60-65%, normal RV, IVC normal, unable to estimate PA systolic pressure.  Last BNP was normal. Suspect respiratory bronchiolitis has improved now that she has quit smoking.  She is off Tyvaso  and is not feeling any worse off it. She has had progressive wt gain in the last 4 months, wt is up 15 lb, in the setting of daily prednisone  use for polyarthritis. She also attributes wt gain to increased PO intake, likely caloric. Volume assessment difficult due to  body habitus.  - will check BNP level. If elevated, will plan to increase torsemide  to 40 mg daily  - Continue spironolactone  12.5 daily.  Check BMP  - Now off Farxiga  per PCP over concern for autoimmune DM and increased risk of DKA. 2. 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  CT chests have shown emphysema and smoking-related respiratory bronchiolitis with ILD.  V/Q scan in 11/23 did not show evidence for PE. She started on Tyvaso  for PH-ILD.  However, she is now off Tyvaso  and feels no worse off it.  RV looked improved on echo 12/24.  Respiratory bronchiolitis may have significantly improved off smoking.  - I think she can stay off Tyvaso .  3. Chronic hypoxemic respiratory failure: Baseline respiratory bronchiolitis-ILD + COPD.  Now off oxygen , much improved. - Follows with Pulmonary. 4. COPD: Emphysema on chest imaging.  Quit smoking 11/23.  5. Atrial fibrillation: Paroxysmal. NSR on EKG today   - Continue Eliquis .  denies abnormal bleeding. Check CBC  - Continue bisoprolol  2.5 mg daily.  6. Cirrhosis: Possible cirrhosis by RUQ US .  This may be due to RV failure.  7. Obesity: Unable to get GLP-1 agonist.   Follow up in 4 months w/ APP.   Ruddy Corral, PA-C   03/06/2024

## 2024-03-07 ENCOUNTER — Ambulatory Visit (HOSPITAL_COMMUNITY): Payer: Self-pay | Admitting: Cardiology

## 2024-03-07 DIAGNOSIS — I5032 Chronic diastolic (congestive) heart failure: Secondary | ICD-10-CM

## 2024-03-14 ENCOUNTER — Ambulatory Visit (HOSPITAL_COMMUNITY): Payer: Self-pay | Admitting: Cardiology

## 2024-03-14 ENCOUNTER — Ambulatory Visit (HOSPITAL_COMMUNITY)
Admission: RE | Admit: 2024-03-14 | Discharge: 2024-03-14 | Disposition: A | Discharge status: Home / Self Care | Source: Ambulatory Visit | Attending: Internal Medicine | Admitting: Internal Medicine

## 2024-03-14 DIAGNOSIS — I5032 Chronic diastolic (congestive) heart failure: Secondary | ICD-10-CM | POA: Insufficient documentation

## 2024-03-14 LAB — BASIC METABOLIC PANEL WITH GFR
Anion gap: 10 (ref 5–15)
BUN: 40 mg/dL — ABNORMAL HIGH (ref 8–23)
CO2: 28 mmol/L (ref 22–32)
Calcium: 9.4 mg/dL (ref 8.9–10.3)
Chloride: 100 mmol/L (ref 98–111)
Creatinine, Ser: 1.5 mg/dL — ABNORMAL HIGH (ref 0.44–1.00)
GFR, Estimated: 39 mL/min — ABNORMAL LOW (ref >60)
Glucose, Bld: 174 mg/dL — ABNORMAL HIGH (ref 70–99)
Potassium: 4.7 mmol/L (ref 3.5–5.1)
Sodium: 138 mmol/L (ref 135–145)

## 2024-03-16 ENCOUNTER — Inpatient Hospital Stay (HOSPITAL_COMMUNITY)

## 2024-03-16 ENCOUNTER — Emergency Department (HOSPITAL_COMMUNITY)

## 2024-03-16 ENCOUNTER — Observation Stay (HOSPITAL_COMMUNITY)
Admission: EM | Admit: 2024-03-16 | Discharge: 2024-03-17 | Disposition: A | Discharge status: Home / Self Care | Attending: Family Medicine | Admitting: Family Medicine

## 2024-03-16 DIAGNOSIS — I5032 Chronic diastolic (congestive) heart failure: Secondary | ICD-10-CM | POA: Diagnosis not present

## 2024-03-16 DIAGNOSIS — Z6841 Body Mass Index (BMI) 40.0 and over, adult: Secondary | ICD-10-CM | POA: Insufficient documentation

## 2024-03-16 DIAGNOSIS — S299XXA Unspecified injury of thorax, initial encounter: Secondary | ICD-10-CM | POA: Diagnosis not present

## 2024-03-16 DIAGNOSIS — N1832 Chronic kidney disease, stage 3b: Secondary | ICD-10-CM | POA: Insufficient documentation

## 2024-03-16 DIAGNOSIS — J449 Chronic obstructive pulmonary disease, unspecified: Secondary | ICD-10-CM | POA: Insufficient documentation

## 2024-03-16 DIAGNOSIS — R55 Syncope and collapse: Secondary | ICD-10-CM | POA: Diagnosis not present

## 2024-03-16 DIAGNOSIS — I130 Hypertensive heart and chronic kidney disease with heart failure and stage 1 through stage 4 chronic kidney disease, or unspecified chronic kidney disease: Secondary | ICD-10-CM | POA: Diagnosis not present

## 2024-03-16 DIAGNOSIS — G4733 Obstructive sleep apnea (adult) (pediatric): Secondary | ICD-10-CM | POA: Diagnosis not present

## 2024-03-16 DIAGNOSIS — E860 Dehydration: Secondary | ICD-10-CM | POA: Diagnosis not present

## 2024-03-16 DIAGNOSIS — I480 Paroxysmal atrial fibrillation: Secondary | ICD-10-CM | POA: Diagnosis not present

## 2024-03-16 DIAGNOSIS — N179 Acute kidney failure, unspecified: Secondary | ICD-10-CM | POA: Insufficient documentation

## 2024-03-16 DIAGNOSIS — S3993XA Unspecified injury of pelvis, initial encounter: Secondary | ICD-10-CM | POA: Diagnosis not present

## 2024-03-16 DIAGNOSIS — Z87891 Personal history of nicotine dependence: Secondary | ICD-10-CM | POA: Diagnosis not present

## 2024-03-16 DIAGNOSIS — M1712 Unilateral primary osteoarthritis, left knee: Secondary | ICD-10-CM | POA: Diagnosis not present

## 2024-03-16 DIAGNOSIS — I272 Pulmonary hypertension, unspecified: Secondary | ICD-10-CM | POA: Diagnosis not present

## 2024-03-16 DIAGNOSIS — S0990XA Unspecified injury of head, initial encounter: Secondary | ICD-10-CM

## 2024-03-16 DIAGNOSIS — E1122 Type 2 diabetes mellitus with diabetic chronic kidney disease: Secondary | ICD-10-CM | POA: Insufficient documentation

## 2024-03-16 DIAGNOSIS — N3000 Acute cystitis without hematuria: Secondary | ICD-10-CM | POA: Insufficient documentation

## 2024-03-16 DIAGNOSIS — D631 Anemia in chronic kidney disease: Secondary | ICD-10-CM | POA: Insufficient documentation

## 2024-03-16 DIAGNOSIS — M797 Fibromyalgia: Secondary | ICD-10-CM | POA: Diagnosis not present

## 2024-03-16 DIAGNOSIS — K7460 Unspecified cirrhosis of liver: Secondary | ICD-10-CM | POA: Insufficient documentation

## 2024-03-16 DIAGNOSIS — M47812 Spondylosis without myelopathy or radiculopathy, cervical region: Secondary | ICD-10-CM | POA: Diagnosis not present

## 2024-03-16 DIAGNOSIS — Z79899 Other long term (current) drug therapy: Secondary | ICD-10-CM | POA: Insufficient documentation

## 2024-03-16 DIAGNOSIS — M25552 Pain in left hip: Secondary | ICD-10-CM

## 2024-03-16 DIAGNOSIS — S8992XA Unspecified injury of left lower leg, initial encounter: Secondary | ICD-10-CM | POA: Diagnosis not present

## 2024-03-16 DIAGNOSIS — M25562 Pain in left knee: Secondary | ICD-10-CM

## 2024-03-16 DIAGNOSIS — M160 Bilateral primary osteoarthritis of hip: Secondary | ICD-10-CM | POA: Diagnosis not present

## 2024-03-16 DIAGNOSIS — M4312 Spondylolisthesis, cervical region: Secondary | ICD-10-CM | POA: Diagnosis not present

## 2024-03-16 LAB — URINALYSIS, ROUTINE W REFLEX MICROSCOPIC
Bilirubin Urine: NEGATIVE
Glucose, UA: NEGATIVE mg/dL
Hgb urine dipstick: NEGATIVE
Ketones, ur: NEGATIVE mg/dL
Nitrite: POSITIVE — AB
Protein, ur: NEGATIVE mg/dL
Specific Gravity, Urine: 1.011 (ref 1.005–1.030)
WBC, UA: >50 WBC/hpf (ref 0–5)
pH: 6 (ref 5.0–8.0)

## 2024-03-16 LAB — CBC
HCT: 34.5 % — ABNORMAL LOW (ref 36.0–46.0)
HCT: 31.7 % — ABNORMAL LOW (ref 36.0–46.0)
Hemoglobin: 9.8 g/dL — ABNORMAL LOW (ref 12.0–15.0)
Hemoglobin: 9.2 g/dL — ABNORMAL LOW (ref 12.0–15.0)
MCH: 22.7 pg — ABNORMAL LOW (ref 26.0–34.0)
MCH: 22.7 pg — ABNORMAL LOW (ref 26.0–34.0)
MCHC: 28.4 g/dL — ABNORMAL LOW (ref 30.0–36.0)
MCHC: 29 g/dL — ABNORMAL LOW (ref 30.0–36.0)
MCV: 80 fL (ref 80.0–100.0)
MCV: 78.3 fL — ABNORMAL LOW (ref 80.0–100.0)
Platelets: 295 10*3/uL (ref 150–400)
Platelets: 228 10*3/uL (ref 150–400)
RBC: 4.31 MIL/uL (ref 3.87–5.11)
RBC: 4.05 MIL/uL (ref 3.87–5.11)
RDW: 16.6 % — ABNORMAL HIGH (ref 11.5–15.5)
RDW: 16.6 % — ABNORMAL HIGH (ref 11.5–15.5)
WBC: 14.7 10*3/uL — ABNORMAL HIGH (ref 4.0–10.5)
WBC: 12 10*3/uL — ABNORMAL HIGH (ref 4.0–10.5)
nRBC: 0.1 % (ref 0.0–0.2)
nRBC: 0 % (ref 0.0–0.2)

## 2024-03-16 LAB — GLUCOSE, CAPILLARY
Glucose-Capillary: 244 mg/dL — ABNORMAL HIGH (ref 70–99)
Glucose-Capillary: 240 mg/dL — ABNORMAL HIGH (ref 70–99)

## 2024-03-16 LAB — COMPREHENSIVE METABOLIC PANEL WITH GFR
ALT: 7 U/L (ref 0–44)
AST: 17 U/L (ref 15–41)
Albumin: 3.6 g/dL (ref 3.5–5.0)
Alkaline Phosphatase: 59 U/L (ref 38–126)
Anion gap: 15 (ref 5–15)
BUN: 43 mg/dL — ABNORMAL HIGH (ref 8–23)
CO2: 23 mmol/L (ref 22–32)
Calcium: 9.6 mg/dL (ref 8.9–10.3)
Chloride: 99 mmol/L (ref 98–111)
Creatinine, Ser: 1.67 mg/dL — ABNORMAL HIGH (ref 0.44–1.00)
GFR, Estimated: 34 mL/min — ABNORMAL LOW (ref >60)
Glucose, Bld: 221 mg/dL — ABNORMAL HIGH (ref 70–99)
Potassium: 4.5 mmol/L (ref 3.5–5.1)
Sodium: 137 mmol/L (ref 135–145)
Total Bilirubin: 0.5 mg/dL (ref 0.0–1.2)
Total Protein: 7.3 g/dL (ref 6.5–8.1)

## 2024-03-16 LAB — TYPE AND SCREEN
ABO/RH(D): A POS
Antibody Screen: NEGATIVE

## 2024-03-16 LAB — I-STAT CG4 LACTIC ACID, ED
Lactic Acid, Venous: 3.5 mmol/L (ref 0.5–1.9)
Lactic Acid, Venous: 1.5 mmol/L (ref 0.5–1.9)

## 2024-03-16 LAB — PROTIME-INR
INR: 1.3 — ABNORMAL HIGH (ref 0.8–1.2)
Prothrombin Time: 16.6 s — ABNORMAL HIGH (ref 11.4–15.2)

## 2024-03-16 LAB — I-STAT CHEM 8, ED
BUN: 50 mg/dL — ABNORMAL HIGH (ref 8–23)
Calcium, Ion: 1.13 mmol/L — ABNORMAL LOW (ref 1.15–1.40)
Chloride: 100 mmol/L (ref 98–111)
Creatinine, Ser: 1.8 mg/dL — ABNORMAL HIGH (ref 0.44–1.00)
Glucose, Bld: 225 mg/dL — ABNORMAL HIGH (ref 70–99)
HCT: 34 % — ABNORMAL LOW (ref 36.0–46.0)
Hemoglobin: 11.6 g/dL — ABNORMAL LOW (ref 12.0–15.0)
Potassium: 4.7 mmol/L (ref 3.5–5.1)
Sodium: 136 mmol/L (ref 135–145)
TCO2: 25 mmol/L (ref 22–32)

## 2024-03-16 LAB — D-DIMER, QUANTITATIVE: D-Dimer, Quant: 0.41 ug{FEU}/mL (ref 0.00–0.50)

## 2024-03-16 LAB — HEMOGLOBIN A1C
Hgb A1c MFr Bld: 9.7 % — ABNORMAL HIGH (ref 4.8–5.6)
Mean Plasma Glucose: 231.69 mg/dL

## 2024-03-16 LAB — HIV ANTIBODY (ROUTINE TESTING W REFLEX): HIV Screen 4th Generation wRfx: NONREACTIVE

## 2024-03-16 LAB — CREATININE, SERUM
Creatinine, Ser: 1.33 mg/dL — ABNORMAL HIGH (ref 0.44–1.00)
GFR, Estimated: 45 mL/min — ABNORMAL LOW (ref >60)

## 2024-03-16 LAB — SAMPLE TO BLOOD BANK

## 2024-03-16 LAB — ETHANOL: Alcohol, Ethyl (B): <15 mg/dL (ref <15)

## 2024-03-16 LAB — TSH: TSH: 0.994 u[IU]/mL (ref 0.350–4.500)

## 2024-03-16 LAB — T4, FREE: Free T4: 0.83 ng/dL (ref 0.61–1.12)

## 2024-03-16 LAB — TROPONIN I (HIGH SENSITIVITY): Troponin I (High Sensitivity): 7 ng/L (ref <18)

## 2024-03-16 LAB — MAGNESIUM: Magnesium: 1.6 mg/dL — ABNORMAL LOW (ref 1.7–2.4)

## 2024-03-16 MED ORDER — ACETAMINOPHEN 325 MG PO TABS
650.0000 mg | ORAL_TABLET | Freq: Four times a day (QID) | ORAL | Status: DC | PRN
Start: 2024-03-16 — End: 2024-03-17
  Administered 2024-03-16 – 2024-03-17 (×2): 650 mg via ORAL
  Filled 2024-03-16 (×2): qty 2

## 2024-03-16 MED ORDER — FLUOXETINE HCL 20 MG PO CAPS
40.0000 mg | ORAL_CAPSULE | Freq: Every day | ORAL | Status: DC
  Administered 2024-03-17: 40 mg via ORAL
  Filled 2024-03-16: qty 2

## 2024-03-16 MED ORDER — SODIUM CHLORIDE 0.9% FLUSH
3.0000 mL | Freq: Two times a day (BID) | INTRAVENOUS | Status: DC
  Administered 2024-03-16 – 2024-03-17 (×3): 3 mL via INTRAVENOUS

## 2024-03-16 MED ORDER — ATORVASTATIN CALCIUM 40 MG PO TABS
40.0000 mg | ORAL_TABLET | Freq: Every day | ORAL | Status: DC
  Administered 2024-03-16: 40 mg via ORAL
  Filled 2024-03-16: qty 1

## 2024-03-16 MED ORDER — INSULIN ASPART 100 UNIT/ML IJ SOLN
0.0000 [IU] | Freq: Three times a day (TID) | INTRAMUSCULAR | Status: DC
  Administered 2024-03-17: 8 [IU] via SUBCUTANEOUS
  Administered 2024-03-17: 3 [IU] via SUBCUTANEOUS

## 2024-03-16 MED ORDER — ACETAMINOPHEN 500 MG PO TABS
1000.0000 mg | ORAL_TABLET | Freq: Once | ORAL | Status: AC
  Administered 2024-03-16: 1000 mg via ORAL
  Filled 2024-03-16: qty 2

## 2024-03-16 MED ORDER — MAGNESIUM SULFATE 2 GM/50ML IV SOLN
2.0000 g | Freq: Once | INTRAVENOUS | Status: AC
  Administered 2024-03-16: 2 g via INTRAVENOUS
  Filled 2024-03-16: qty 50

## 2024-03-16 MED ORDER — APIXABAN 5 MG PO TABS
5.0000 mg | ORAL_TABLET | Freq: Two times a day (BID) | ORAL | Status: DC
  Administered 2024-03-16 – 2024-03-17 (×2): 5 mg via ORAL
  Filled 2024-03-16 (×2): qty 1

## 2024-03-16 MED ORDER — ZIPRASIDONE HCL 20 MG PO CAPS
20.0000 mg | ORAL_CAPSULE | Freq: Every day | ORAL | Status: DC
  Administered 2024-03-17: 20 mg via ORAL
  Filled 2024-03-16: qty 1

## 2024-03-16 MED ORDER — SODIUM CHLORIDE 0.9% FLUSH: 3.0000 mL | INTRAVENOUS | Status: DC | PRN

## 2024-03-16 MED ORDER — ACETAMINOPHEN 650 MG RE SUPP: 650.0000 mg | Freq: Four times a day (QID) | RECTAL | Status: DC | PRN

## 2024-03-16 MED ORDER — SODIUM CHLORIDE 0.9 % IV SOLN: Freq: Once | INTRAVENOUS | Status: AC

## 2024-03-16 MED ORDER — PANTOPRAZOLE SODIUM 40 MG IV SOLR
40.0000 mg | Freq: Two times a day (BID) | INTRAVENOUS | Status: DC
  Administered 2024-03-16 – 2024-03-17 (×3): 40 mg via INTRAVENOUS
  Filled 2024-03-16 (×3): qty 10

## 2024-03-16 MED ORDER — PREDNISONE 5 MG PO TABS
5.0000 mg | ORAL_TABLET | Freq: Every day | ORAL | Status: DC
  Administered 2024-03-17: 5 mg via ORAL
  Filled 2024-03-16: qty 1

## 2024-03-16 MED ORDER — DOXEPIN HCL 25 MG PO CAPS
100.0000 mg | ORAL_CAPSULE | Freq: Every day | ORAL | Status: DC
  Administered 2024-03-16: 100 mg via ORAL
  Filled 2024-03-16: qty 4
  Filled 2024-03-16: qty 1
  Filled 2024-03-16: qty 4
  Filled 2024-03-16: qty 2

## 2024-03-16 MED ORDER — HEPARIN SODIUM (PORCINE) 5000 UNIT/ML IJ SOLN: 5000.0000 [IU] | Freq: Three times a day (TID) | INTRAMUSCULAR | Status: DC

## 2024-03-16 MED ORDER — SODIUM CHLORIDE 0.9 % IV SOLN
1.0000 g | INTRAVENOUS | Status: DC
  Administered 2024-03-16: 1 g via INTRAVENOUS
  Filled 2024-03-16: qty 10

## 2024-03-16 MED ORDER — SODIUM CHLORIDE 0.9 % IV BOLUS
500.0000 mL | Freq: Once | INTRAVENOUS | Status: AC
  Administered 2024-03-16: 500 mL via INTRAVENOUS

## 2024-03-16 MED ORDER — SODIUM CHLORIDE 0.9% FLUSH
3.0000 mL | Freq: Two times a day (BID) | INTRAVENOUS | Status: DC
  Administered 2024-03-16: 10 mL via INTRAVENOUS

## 2024-03-16 NOTE — ED Notes (Signed)
 CCMD called to admit the patient for cardiac monitoring.

## 2024-03-16 NOTE — H&P (Signed)
 History and Physical    Patient: [name] FMW:[id number] DOB: Nov 03, 1960 DOA: 03/16/2024 DOS: the patient was seen and examined on 03/16/2024 . PCP: Loreli Elsie JONETTA Mickey., MD  Patient coming from: [facility] Chief complaint: Chief Complaint  Patient presents with   Level 2 Fall on thinners   Loss of Consciousness   Knee Pain   HPI:  [name] is a 63 y.o. female with past medical history  of  COPD, HTN, HLD, Type 2 DM, chronic pain syndrome/fibromyalgia and pulmonary hypertension with RV failure/cor pulmonale, paroxysmal A-fib on anticoagulation, SVT noted on holter monitor 09/2022 tx beta blocker, chronic diastolic congestive heart failure, untreated OSA, cirrhosis, CKD stage IIIb, presenting today for syncope and collapse.  Patient was here with her mother for scheduled cardioversion and close noted to pass out all of a sudden.  She was walking down the hallway and her started to feel funny and the next thing she knew she was on the floor per report she did hit her head and has complaints of left knee pain.  Patient reports that she does feel dizzy often.  But no reports of chest pain nausea vomiting diarrhea fevers chills.  Patient was brought to the ED as a level 2 trauma because she was on anticoagulation.  ED Course: Pt in ed at bedside  is alert awake oriented afebrile. Vital signs in the ED were notable for the following:  Vitals:   03/16/24 1321 03/16/24 1549 03/16/24 1604 03/16/24 1643  BP:  96/70  135/72  Pulse:  83 67 100  Temp: 98.2 F (36.8 C) 98.2 F (36.8 C)  98.6 F (37 C)  Resp:  14 10 16   Height:      Weight:      SpO2:  100% 100% 98%  TempSrc: Oral   Oral  BMI (Calculated):      >>ED evaluation thus far shows: Head CT and CT cervical spine was without evidence of any acute injuries.  Minor right maxillary sinus mucosal thickening, no acute cervical spine fracture, paraseptal emphysema, left apical bullae. Initial CMP showed a glucose of 221  creatinine of 1.67 EGFR of 34 normal electrolytes and LFTs otherwise. Initial lactic of 3.5. Initial CBC showing white count of 14.7 hemoglobin of 9.8 which is chronic platelets of 296 normal MCV RDW of 16.6. Abnormal urinalysis with hazy urine large leukocytes positive nitrites more than 50 WBC.  >>While in the ED patient received the following: Medications  pantoprazole  (PROTONIX ) injection 40 mg (40 mg Intravenous Given 03/16/24 1523)  cefTRIAXone (ROCEPHIN) 1 g in sodium chloride  0.9 % 100 mL IVPB (1 g Intravenous New Bag/Given 03/16/24 1552)  sodium chloride  flush (NS) 0.9 % injection 3 mL (3 mLs Intravenous Given 03/16/24 1524)  acetaminophen  (TYLENOL ) tablet 650 mg (has no administration in time range)    Or  acetaminophen  (TYLENOL ) suppository 650 mg (has no administration in time range)  sodium chloride  flush (NS) 0.9 % injection 3-10 mL (10 mLs Intravenous Given 03/16/24 1524)  sodium chloride  flush (NS) 0.9 % injection 3-10 mL (has no administration in time range)  heparin  injection 5,000 Units (has no administration in time range)  sodium chloride  0.9 % bolus 500 mL (0 mLs Intravenous Stopped 03/16/24 1308)  acetaminophen  (TYLENOL ) tablet 1,000 mg (1,000 mg Oral Given 03/16/24 1313)     Review of Systems  Constitutional:  Positive for malaise/fatigue.  Neurological:  Positive for dizziness, loss of consciousness and weakness.   Past Medical History:  Diagnosis Date   Acid reflux disease    Anxiety    Asthma    Bronchitis, chronic (HCC)    Effects worse with URI   Chronic pain syndrome    COPD (chronic obstructive pulmonary disease) (HCC)    Depression    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, type 2   Diverticulitis    Dysplasia of cervix (uteri)    patient states she had dysplasia of the uterus stage 4   Endometrial cancer (HCC)    Fibromyalgia    Fibromyalgia    GERD (gastroesophageal reflux disease)    Headache(784.0)    migraine headache    Hyperlipidemia    Hypertension    Kidney stones    at least 6 stones in past   Pneumonia 2019   Restless leg syndrome    Bilateral   Vomiting    last night at 2300 after last round of antibiotic   Past Surgical History:  Procedure Laterality Date   ABDOMINAL HYSTERECTOMY     APPENDECTOMY     APPLICATION OF WOUND VAC N/A 11/01/2012   Procedure: APPLICATION OF WOUND VAC;  Surgeon: Debby LABOR. Cornett, MD;  Location: [facility];  Service: General;  Laterality: N/A;   CHOLECYSTECTOMY     COLONOSCOPY  2013   COLOSTOMY     COLOSTOMY CLOSURE N/A 11/01/2012   Procedure: COLOSTOMY CLOSURE;  Surgeon: Debby LABOR. Cornett, MD;  Location: [facility];  Service: General;  Laterality: N/A;  REPAIR PARASTOMAL HERNIA; REVISION OF COLOSTOMY   COLOSTOMY CLOSURE N/A 05/03/2013   Procedure: COLOSTOMY CLOSURE;  Surgeon: Debby LABOR. Cornett, MD;  Location: [facility];  Service: General;  Laterality: N/A;   DILATION AND CURETTAGE OF UTERUS N/A 05/24/2019   Procedure: DILATATION AND CURETTAGE;  Surgeon: Shannon Agent, MD;  Location: [facility];  Service: Gynecology;  Laterality: N/A;   ESOPHAGOGASTRODUODENOSCOPY Left 11/27/2012   Procedure: ESOPHAGOGASTRODUODENOSCOPY (EGD);  Surgeon: Agent LITTIE Celestia Mickey., MD;  Location: [facility];  Service: Gastroenterology;  Laterality: Left;   INCISIONAL HERNIA REPAIR N/A 11/01/2012   Procedure: HERNIA REPAIR INCISIONAL;  Surgeon: Debby LABOR. Cornett, MD;  Location: [facility];  Service: General;  Laterality: N/A;   LAPAROTOMY  11/01/2012   Procedure: EXPLORATORY LAPAROTOMY;  Surgeon: Debby LABOR. Cornett, MD;  Location: [facility];  Service: General;;  exploratory laparotomy,repair of parastomal hernia and incisional hernia, lysis of adhesions, and application of wound V.A.C.   LYSIS OF ADHESION N/A 11/01/2012   Procedure: LYSIS OF ADHESION;  Surgeon: Debby LABOR. Cornett, MD;  Location: [facility];  Service: General;  Laterality: N/A;   OPERATIVE ULTRASOUND N/A 06/14/2019   Procedure: OPERATIVE ULTRASOUND;  Surgeon:  Shannon Agent, MD;  Location: [facility];  Service: Urology;  Laterality: N/A;   PARASTOMAL HERNIA REPAIR N/A 11/01/2012   Procedure: HERNIA REPAIR PARASTOMAL;  Surgeon: Debby LABOR. Cornett, MD;  Location: [facility];  Service: General;  Laterality: N/A;   PARTIAL HYSTERECTOMY     partial   RIGHT HEART CATH N/A 07/26/2022   Procedure: RIGHT HEART CATH;  Surgeon: Rolan Ezra RAMAN, MD;  Location: [facility];  Service: Cardiovascular;  Laterality: N/A;   TANDEM RING INSERTION N/A 05/24/2019   Procedure: INSERTION OF HYMEN CAPSULE;  Surgeon: Shannon Agent, MD;  Location: [facility];  Service: Gynecology;  Laterality: N/A;   TANDEM RING INSERTION N/A 06/14/2019   Procedure: HYMEN CAPSULE PLACEMENT;  Surgeon: Shannon Agent, MD;  Location: [facility];  Service: Urology;  Laterality: N/A;  reports that she quit smoking about 15 years ago. Her smoking use included cigarettes. She has never used smokeless tobacco. She reports that she does not drink alcohol  and does not use drugs. Allergies  Allergen Reactions   Ultram  [Tramadol ] Other (See Comments)    Per Dr. Dicky is renal failure   Ozempic (0.25 Or 0.5 Mg-Dose) [Semaglutide(0.25 Or 0.5mg -Dos)] Nausea And Vomiting and Other (See Comments)    Stomach pain No appetite    Family History  Problem Relation Age of Onset   Diabetes Mother    Throat cancer Mother    Uterine cancer Sister    Deep vein thrombosis Neg Hx    Pulmonary embolism Neg Hx    Prior to Admission medications   Medication Sig Start Date End Date Taking? Authorizing Provider  acetaminophen  (TYLENOL ) 500 MG tablet Take 1,500 mg by mouth 2 (two) times daily as needed (pain).   Yes [provider]  apixaban  (ELIQUIS ) 5 MG TABS tablet Take 1 tablet (5 mg total) by mouth 2 (two) times daily. 08/04/22  Yes Fairy Frames, MD  atorvastatin  (LIPITOR) 40 MG tablet Take 40 mg by mouth at bedtime.  01/17/14  Yes [provider]  bisoprolol  (ZEBETA ) 5 MG tablet TAKE 1  TABLET (5 MG TOTAL) BY MOUTH DAILY. 02/17/24  Yes Rolan Ezra RAMAN, MD  clonazePAM  (KLONOPIN ) 0.25 MG disintegrating tablet Take 1 tablet (0.25 mg total) by mouth 2 (two) times daily as needed (anxiety). 01/09/24  Yes Arfeen, Leni DASEN, MD  cyclobenzaprine  (FLEXERIL ) 10 MG tablet TAKE 1 TABLET BY MOUTH AT BEDTIME AS NEEDED. Patient taking differently: Take 10 mg by mouth at bedtime. 02/21/24  Yes Rice, Lonni ORN, MD  doxepin  (SINEQUAN ) 100 MG capsule Take 1 capsule (100 mg total) by mouth at bedtime. 01/09/24 04/08/24 Yes Arfeen, Leni DASEN, MD  FLUoxetine  (PROZAC ) 40 MG capsule TAKE 1 CAPSULE BY MOUTH EVERY DAY 01/09/24  Yes Arfeen, Leni DASEN, MD  insulin  degludec (TRESIBA FLEXTOUCH) 200 UNIT/ML FlexTouch Pen Inject 80 Units into the skin daily.   Yes [provider]  omeprazole  (PRILOSEC) 40 MG capsule Take 40 mg by mouth daily before breakfast.  06/07/17  Yes [provider]  predniSONE  (DELTASONE ) 5 MG tablet TAKE 1 TABLET BY MOUTH EVERY DAY WITH BREAKFAST 02/28/24  Yes Rice, Lonni ORN, MD  spironolactone  (ALDACTONE ) 25 MG tablet Take 0.5 tablets (12.5 mg total) by mouth daily. 08/05/22  Yes Fairy Frames, MD  torsemide  (DEMADEX ) 20 MG tablet Take 40 mg by mouth daily.   Yes [provider]  ziprasidone  (GEODON ) 20 MG capsule Take 1 capsule (20 mg total) by mouth daily after breakfast. 01/09/24  Yes Arfeen, Leni DASEN, MD                                                                                 Vitals:   03/16/24 1321 03/16/24 1549 03/16/24 1604 03/16/24 1643  BP:  96/70  135/72  Pulse:  83 67 100  Resp:  14 10 16   Temp: 98.2 F (36.8 C) 98.2 F (36.8 C)  98.6 F (37 C)  TempSrc: Oral   Oral  SpO2:  100% 100% 98%  Weight:  Height:       Physical Exam Vitals and nursing note reviewed.  Constitutional:      General: She is not in acute distress.    Appearance: She is obese. She is not ill-appearing.  HENT:     Head: Normocephalic and atraumatic.     Right  Ear: Hearing and external ear normal.     Left Ear: Hearing and external ear normal.     Nose: No nasal deformity.     Mouth/Throat:     Lips: Pink.   Eyes:     General: Lids are normal.     Extraocular Movements: Extraocular movements intact.    Cardiovascular:     Rate and Rhythm: Normal rate and regular rhythm.     Heart sounds: Normal heart sounds.  Pulmonary:     Effort: Pulmonary effort is normal.     Breath sounds: Normal breath sounds.  Abdominal:     General: Bowel sounds are normal. There is no distension.     Palpations: Abdomen is soft. There is no mass.     Tenderness: There is no abdominal tenderness.   Musculoskeletal:     Right lower leg: No edema.     Left lower leg: No edema.   Skin:    General: Skin is warm.   Neurological:     General: No focal deficit present.     Mental Status: She is alert and oriented to person, place, and time.     Cranial Nerves: Cranial nerves 2-12 are intact. No dysarthria or facial asymmetry.     Motor: Motor function is intact. No weakness or tremor.     Coordination: Finger-Nose-Finger Test normal.   Psychiatric:        Attention and Perception: Attention normal.        Mood and Affect: Mood normal.        Speech: Speech normal.        Behavior: Behavior normal. Behavior is cooperative.     Labs on Admission: I have personally reviewed following labs and imaging studies CBC: Recent Labs  Lab 03/16/24 1008 03/16/24 1019  WBC 14.7*  --   HGB 9.8* 11.6*  HCT 34.5* 34.0*  MCV 80.0  --   PLT 295  --    Basic Metabolic Panel: Recent Labs  Lab 03/14/24 1402 03/16/24 1008 03/16/24 1019  NA 138 137 136  K 4.7 4.5 4.7  CL 100 99 100  CO2 28 23  --   GLUCOSE 174* 221* 225*  BUN 40* 43* 50*  CREATININE 1.50* 1.67* 1.80*  CALCIUM  9.4 9.6  --    GFR: Estimated Creatinine Clearance: 36.9 mL/min (A) (by C-G formula based on SCr of 1.8 mg/dL (H)). Liver Function Tests: Recent Labs  Lab 03/16/24 1008  AST 17   ALT 7  ALKPHOS 59  BILITOT 0.5  PROT 7.3  ALBUMIN  3.6   No results for input(s): LIPASE, AMYLASE in the last 168 hours. No results for input(s): AMMONIA in the last 168 hours. Coagulation Profile: Recent Labs  Lab 03/16/24 1008  INR 1.3*   Cardiac Enzymes: No results for input(s): CKTOTAL, CKMB, CKMBINDEX, TROPONINI in the last 168 hours. BNP (last 3 results) No results for input(s): PROBNP in the last 8760 hours. HbA1C: No results for input(s): HGBA1C in the last 72 hours. CBG: No results for input(s): GLUCAP in the last 168 hours. Lipid Profile: No results for input(s): CHOL, HDL, LDLCALC, TRIG, CHOLHDL, LDLDIRECT in the last 72 hours.  Thyroid  Function Tests: No results for input(s): TSH, T4TOTAL, FREET4, T3FREE, THYROIDAB in the last 72 hours. Anemia Panel: No results for input(s): VITAMINB12, FOLATE, FERRITIN, TIBC, IRON, RETICCTPCT in the last 72 hours. Urine analysis:    Component Value Date/Time   COLORURINE YELLOW 03/16/2024 1314   APPEARANCEUR HAZY (A) 03/16/2024 1314   LABSPEC 1.011 03/16/2024 1314   PHURINE 6.0 03/16/2024 1314   GLUCOSEU NEGATIVE 03/16/2024 1314   HGBUR NEGATIVE 03/16/2024 1314   BILIRUBINUR NEGATIVE 03/16/2024 1314   KETONESUR NEGATIVE 03/16/2024 1314   PROTEINUR NEGATIVE 03/16/2024 1314   UROBILINOGEN 0.2 11/02/2012 1530   NITRITE POSITIVE (A) 03/16/2024 1314   LEUKOCYTESUR LARGE (A) 03/16/2024 1314   Radiological Exams on Admission: CT HEAD WO CONTRAST Result Date: 03/16/2024 CLINICAL DATA:  Provided history: Head trauma, moderate/severe. Polytrauma, blunt. EXAM: CT HEAD WITHOUT CONTRAST CT CERVICAL SPINE WITHOUT CONTRAST TECHNIQUE: Multidetector CT imaging of the head and cervical spine was performed following the standard protocol without intravenous contrast. Multiplanar CT image reconstructions of the cervical spine were also generated. RADIATION DOSE REDUCTION: This exam was  performed according to the departmental dose-optimization program which includes automated exposure control, adjustment of the mA and/or kV according to patient size and/or use of iterative reconstruction technique. COMPARISON:  Cervical spine radiographs 04/30/2010. FINDINGS: CT HEAD FINDINGS Brain: There is no acute intracranial hemorrhage. No demarcated cortical infarct. No extra-axial fluid collection. No evidence of an intracranial mass. No midline shift. Vascular: No hyperdense vessel.  Atherosclerotic calcifications. Skull: No calvarial fracture or aggressive osseous lesion. Sinuses/Orbits: No mass or acute finding within the imaged orbits. Minimal mucosal thickening within the right maxillary sinus. CT CERVICAL SPINE FINDINGS Alignment: Slight grade 1 anterolisthesis at C2-C3, C3-C4, C4-C5 and C5-C6. Skull base and vertebrae: The basion-dental and atlanto-dental intervals are maintained.No evidence of acute fracture to the cervical spine. Soft tissues and spinal canal: No prevertebral fluid or swelling. No visible canal hematoma. Disc levels: Mild cervical spondylosis. No significant spinal canal stenosis is appreciated. No significant bony neural foraminal narrowing. Upper chest: No consolidation within the imaged lung apices. No visible pneumothorax. Paraseptal emphysema. Partially imaged left apical bulla. IMPRESSION: CT head: 1. No evidence of an acute intracranial abnormality. 2. Minor right maxillary sinus mucosal thickening. CT cervical spine: 1. No evidence of an acute cervical spine fracture. 2. Mild grade 1 anterolisthesis at C2-C3, C3-C4, C4-C5 and C5-C6. 3. Mild cervical spondylosis. 4. Paraseptal emphysema. Partially imaged left apical bulla. Electronically Signed   By: Rockey Childs D.O.   On: 03/16/2024 11:30   CT CERVICAL SPINE WO CONTRAST Result Date: 03/16/2024 CLINICAL DATA:  Provided history: Head trauma, moderate/severe. Polytrauma, blunt. EXAM: CT HEAD WITHOUT CONTRAST CT CERVICAL  SPINE WITHOUT CONTRAST TECHNIQUE: Multidetector CT imaging of the head and cervical spine was performed following the standard protocol without intravenous contrast. Multiplanar CT image reconstructions of the cervical spine were also generated. RADIATION DOSE REDUCTION: This exam was performed according to the departmental dose-optimization program which includes automated exposure control, adjustment of the mA and/or kV according to patient size and/or use of iterative reconstruction technique. COMPARISON:  Cervical spine radiographs 04/30/2010. FINDINGS: CT HEAD FINDINGS Brain: There is no acute intracranial hemorrhage. No demarcated cortical infarct. No extra-axial fluid collection. No evidence of an intracranial mass. No midline shift. Vascular: No hyperdense vessel.  Atherosclerotic calcifications. Skull: No calvarial fracture or aggressive osseous lesion. Sinuses/Orbits: No mass or acute finding within the imaged orbits. Minimal mucosal thickening within the right maxillary sinus. CT CERVICAL SPINE FINDINGS  Alignment: Slight grade 1 anterolisthesis at C2-C3, C3-C4, C4-C5 and C5-C6. Skull base and vertebrae: The basion-dental and atlanto-dental intervals are maintained.No evidence of acute fracture to the cervical spine. Soft tissues and spinal canal: No prevertebral fluid or swelling. No visible canal hematoma. Disc levels: Mild cervical spondylosis. No significant spinal canal stenosis is appreciated. No significant bony neural foraminal narrowing. Upper chest: No consolidation within the imaged lung apices. No visible pneumothorax. Paraseptal emphysema. Partially imaged left apical bulla. IMPRESSION: CT head: 1. No evidence of an acute intracranial abnormality. 2. Minor right maxillary sinus mucosal thickening. CT cervical spine: 1. No evidence of an acute cervical spine fracture. 2. Mild grade 1 anterolisthesis at C2-C3, C3-C4, C4-C5 and C5-C6. 3. Mild cervical spondylosis. 4. Paraseptal emphysema.  Partially imaged left apical bulla. Electronically Signed   By: Rockey Childs D.O.   On: 03/16/2024 11:30   DG Pelvis Portable Result Date: 03/16/2024 CLINICAL DATA:  Trauma.  Fall. EXAM: PORTABLE PELVIS 1-2 VIEWS COMPARISON:  None Available. FINDINGS: Pelvis is intact with normal and symmetric sacroiliac joints. No acute fracture or dislocation. No aggressive osseous lesion. Visualized sacral arcuate lines are unremarkable. Unremarkable symphysis pubis. There are mild degenerative changes of bilateral hip joints without significant joint space narrowing. Osteophytosis of the superior acetabulum. No radiopaque foreign bodies. IMPRESSION: *No acute osseous abnormality of the pelvis. Electronically Signed   By: Ree Molt M.D.   On: 03/16/2024 10:33   DG Knee Left Port Result Date: 03/16/2024 CLINICAL DATA:  Blunt Trauma.  Fall. EXAM: PORTABLE LEFT KNEE - 1-2 VIEW COMPARISON:  None Available. FINDINGS: No acute fracture or dislocation. No aggressive osseous lesion. There are degenerative changes of the knee joint in the form of mildly reduced tibio-femoral compartment joint space, tibial spiking and osteophytosis. No knee effusion or focal soft tissue swelling. No radiopaque foreign bodies. IMPRESSION: No acute osseous abnormality of the left knee joint. Electronically Signed   By: Ree Molt M.D.   On: 03/16/2024 10:32   DG Chest Port 1 View Result Date: 03/16/2024 CLINICAL DATA:  Trauma.  Fall. EXAM: PORTABLE CHEST 1 VIEW COMPARISON:  07/26/2022. FINDINGS: Bilateral lung fields are clear. Bilateral costophrenic angles are clear. Normal cardio-mediastinal silhouette. No acute osseous abnormalities. The soft tissues are within normal limits. IMPRESSION: No active disease. Electronically Signed   By: Ree Molt M.D.   On: 03/16/2024 10:29   Data Reviewed: Relevant notes from primary care and specialist visits, past discharge summaries as available in EHR, including Care Everywhere . Prior  diagnostic testing as pertinent to current admission diagnoses, Updated medications and problem lists for reconciliation .ED course, including vitals, labs, imaging, treatment and response to treatment,Triage notes, nursing and pharmacy notes and ED provider's notes.Notable results as noted in HPI.Discussed case with EDMD/ ED APP/ or Specialty MD on call and as needed.  Assessment & Plan  >> Syncope /collapse: At bedside discussed with patient that with her risk factors it is likely that she may have had a TIA and that I would like to proceed with the MRI of the brain carotid Dopplers 2D echocardiogram.  Patient agrees with the plan.  Suspect patient is orthostatic.  And will get orthostatic vitals.   >>Obesity with a BMI of 41.63/ Diabetes mellitus type 2: Carb consistent diet and glycemic protocol.  Home regimen of Tresiba is currently held.   >>AKI on CKD stage IIIb: Lab Results  Component Value Date   CREATININE 1.80 (H) 03/16/2024   CREATININE 1.67 (H) 03/16/2024  CREATININE 1.50 (H) 03/14/2024  Suspect due to low blood pressures.   >>Lactic acidosis: Resolved suspect secondary to hypoperfusion from dehydration from diuretic therapy rather than sepsis and septic shock.   >>Sepsis UTI: Patient meets sepsis criteria with lactic acidosis leukocytosis UTI heart rate above 90 afebrile otherwise. Will follow cultures and continue with Rocephin.    >>Anemia: Anemia is chronic, patient has had occult stool positive in the past and the last endoscopy was in 2014.  Will proceed with stool occult x 3, IV PPI.  Currently no reports of melena or any symptoms of bleeding.  But I feel it is prudent to make sure in her case with the syncope episode that she may not be intermittently bleeding contributing to her hypovolemia although less likely.     >>Chronic diastolic congestive heart failure, right ventricular failure pulmonary hypertension and history of cor pulmonale: Currently patient  is stable and dry.  Will currently hold her torsemide .  Strict I's and O's and daily weights.  I will also have to hold some of her other blood pressure medications.  Patient is currently on bisoprolol  5 mg, Aldactone  25 and torsemide  20 mg currently all 3 are being held due to low blood pressure.   >>Untreated OSA: Will defer to PCP for education for OSA and untreated OSA and complications thereof especially right-sided heart failure and pulmonary hypertension which the patient has already, and for referral for oral appliance if the patient cannot tolerate her CPAP mask.     >>Essential hypertension: Vitals:   03/16/24 1004 03/16/24 1005 03/16/24 1015 03/16/24 1045  BP: (!) 150/110 120/78 (!) 133/47 125/61   03/16/24 1100 03/16/24 1115 03/16/24 1130 03/16/24 1145  BP: 111/71 124/83 123/79 119/64   03/16/24 1230 03/16/24 1245 03/16/24 1549 03/16/24 1643  BP: (!) 108/58 110/65 96/70 135/72  Again were to hold patient's bisoprolol  Aldactone  and torsemide .    >>Paroxysmal A-fib: Currently on Eliquis .    DVT prophylaxis:  Eliquis  Consults:  None Advance Care Planning:    Code Status: Full Code   Family Communication:  None Disposition Plan:  Home Severity of Illness: The appropriate patient status for this patient is INPATIENT. Inpatient status is judged to be reasonable and necessary in order to provide the required intensity of service to ensure the patient's safety. The patient's presenting symptoms, physical exam findings, and initial radiographic and laboratory data in the context of their chronic comorbidities is felt to place them at high risk for further clinical deterioration. Furthermore, it is not anticipated that the patient will be medically stable for discharge from the hospital within 2 midnights of admission.   * I certify that at the point of admission it is my clinical judgment that the patient will require inpatient hospital care spanning beyond 2 midnights from  the point of admission due to high intensity of service, high risk for further deterioration and high frequency of surveillance required.*  Unresulted Labs (From admission, onward)     Start     Ordered   03/17/24 0500  Occult blood card to lab, stool RN will collect  Daily,   R     Question:  Specimen to be collected by:  Answer:  RN will collect   03/16/24 1442   03/17/24 0500  Comprehensive metabolic panel  Tomorrow morning,   R        03/16/24 1449   03/17/24 0500  CBC  Tomorrow morning,   R        03/16/24  1449   03/16/24 1648  Hemoglobin A1c  Once,   R       Comments: To assess prior glycemic control    03/16/24 1647   03/16/24 1448  HIV Antibody (routine testing w rflx)  (HIV Antibody (Routine testing w reflex) panel)  Once,   R        03/16/24 1449   03/16/24 1448  CBC  (heparin )  Once,   R       Comments: Baseline for heparin  therapy IF NOT ALREADY DRAWN.  Notify MD if PLT < 100 K.    03/16/24 1449   03/16/24 1448  Creatinine, serum  (heparin )  Once,   R       Comments: Baseline for heparin  therapy IF NOT ALREADY DRAWN.    03/16/24 1449   03/16/24 1444  D-dimer, quantitative  ONCE - STAT,   STAT        03/16/24 1443   03/16/24 1442  Magnesium   Add-on,   AD        03/16/24 1441   03/16/24 1442  T4, free  Add-on,   AD        03/16/24 1441   03/16/24 1442  TSH  Add-on,   AD        03/16/24 1441   03/16/24 1442  Culture, blood (Routine X 2) w Reflex to ID Panel  BLOOD CULTURE X 2,   R      03/16/24 1442   03/16/24 1442  Urinalysis, w/ Reflex to Culture (Infection Suspected) -Urine, Random  (Urine Labs)  Once,   URGENT       Question:  Specimen Source  Answer:  Urine, Random   03/16/24 1442            Meds ordered this encounter  Medications   sodium chloride  0.9 % bolus 500 mL   acetaminophen  (TYLENOL ) tablet 1,000 mg   pantoprazole  (PROTONIX ) injection 40 mg   cefTRIAXone (ROCEPHIN) 1 g in sodium chloride  0.9 % 100 mL IVPB    Antibiotic Indication::   UTI    sodium chloride  flush (NS) 0.9 % injection 3 mL   OR Linked Order Group    acetaminophen  (TYLENOL ) tablet 650 mg    acetaminophen  (TYLENOL ) suppository 650 mg   sodium chloride  flush (NS) 0.9 % injection 3-10 mL   sodium chloride  flush (NS) 0.9 % injection 3-10 mL   DISCONTD: heparin  injection 5,000 Units   apixaban  (ELIQUIS ) tablet 5 mg   atorvastatin  (LIPITOR) tablet 40 mg   doxepin  (SINEQUAN ) capsule 100 mg   FLUoxetine  (PROZAC ) capsule 40 mg    TAKE 1 CAPSULE BY MOUTH EVERY DAY     predniSONE  (DELTASONE ) tablet 5 mg   ziprasidone  (GEODON ) capsule 20 mg   insulin  aspart (novoLOG ) injection 0-15 Units    Correction coverage::   Moderate (average weight, post-op)    CBG < 70::   implement hypoglycemia protocol    CBG 70 - 120::   0 units    CBG 121 - 150::   2 units    CBG 151 - 200::   3 units    CBG 201 - 250::   5 units    CBG 251 - 300::   8 units    CBG 301 - 350::   11 units    CBG 351 - 400::   15 units    CBG > 400:   call MD and obtain STAT lab verification     Orders Placed  This Encounter  Procedures   Culture, blood (Routine X 2) w Reflex to ID Panel   DG Chest Port 1 View   CT HEAD WO CONTRAST   CT CERVICAL SPINE WO CONTRAST   DG Knee Left Port   DG Pelvis Portable   MR BRAIN WO CONTRAST   Comprehensive metabolic panel   CBC   Ethanol   Urinalysis, Routine w reflex microscopic -Urine, Clean Catch   Protime-INR   Magnesium    T4, free   TSH   Urinalysis, w/ Reflex to Culture (Infection Suspected) -Urine, Random   Occult blood card to lab, stool RN will collect   D-dimer, quantitative   HIV Antibody (routine testing w rflx)   CBC   Creatinine, serum   Comprehensive metabolic panel   CBC   Hemoglobin A1c   Diet Carb Modified Fluid consistency: Thin; Room service appropriate? Yes   ED Cardiac monitoring   Measure blood pressure   Initiate Carrier Fluid Protocol   Cardiac monitoring   Maintain IV access   Vital signs   Orthostatic vital signs on  admission   Orthostatic vital signs   Notify physician (specify)   Refer to Sidebar Report Refer to ICU, Med-Surg, Progressive, and Step-Down Mobility Protocol Sidebars   Daily weights   If patient diabetic or glucose greater than 140 notify physician for Sliding Scale Insulin  Orders   Intake and output   Neuro checks   Apply Syncope Care Plan   Initiate Oral Care Protocol   Initiate Carrier Fluid Protocol   Mobility Protocol: No Restrictions RN to initiate protocols based on patient's level of care   Ambulate with assistance   Apply Diabetes Mellitus Care Plan   STAT CBG when hypoglycemia is suspected. If treated, recheck every 15 minutes after each treatment until CBG >/= 70 mg/dl   Refer to Hypoglycemia Protocol Sidebar Report for treatment of CBG < 70 mg/dl   No HS correction Insulin    Full code   Consult to hospitalist   monitoring by pharmacy   ED Pulse oximetry, continuous   I-Stat Chem 8, ED   I-Stat Lactic Acid, ED   I-Stat CG4 Lactic Acid   EKG 12-Lead   Repeat EKG   EKG 12-Lead   EKG 12-Lead   ECHOCARDIOGRAM COMPLETE   Sample to Blood Bank   Type and screen   Admit to Inpatient (patient's expected length of stay will be greater than 2 midnights or inpatient only procedure)   Fall precautions   Aspiration precautions   VAS US  CAROTID    Author: Mario LULLA Blanch, MD 12 pm- 8 pm. [facility]. 03/16/2024 4:49 PM Please note for any communication after hours contact [facility] Assigned provider on call on Amion.

## 2024-03-16 NOTE — ED Notes (Signed)
 Pt to room with transport and all belongings.

## 2024-03-16 NOTE — ED Notes (Addendum)
 Family member in lobby waiting to go back to see pt. When able to come back, they are in visitor section of lobby.

## 2024-03-16 NOTE — ED Provider Notes (Signed)
 [place] EMERGENCY DEPARTMENT AT [facility] Provider Note   CSN: [id number] Arrival date & time: 03/16/24  9042     Patient presents with: Level 2 Fall on thinners, Loss of Consciousness, and Knee Pain   [name] is a 63 y.o. female.   Patient with h/o COPD, HTN, HLD, Type 2 DM, chronic pain syndrome/fibromyalgia and pulmonary hypertension with RV failure/cor pulmonale, paroxysmal A-fib on anticoagulation, SVT noted on holter monitor 09/2022 tx beta blocker, polymyalgia rheumatica on chronic prednisone -- presents to the emergency department today for evaluation of syncope and head injury, also knee injury.  Patient presented to the hospital today with mother for a heart procedure.  Patient, while walking, suddenly seem to feel like she was stumbling very briefly and then passed out.  She woke on the floor surrounded by other family members.  She is on anticoagulation and hit her head.  She complains of left knee pain.  Patient denies passing out like this in the past, however has had a few episodes over the past week where she felt lightheaded and felt off balance but did not have full syncope like she did today.  She denies associated headache, chest pain or shortness of breath.  At time of ED exam, denies these as well.  Currently she does have pain in her knee.       Prior to Admission medications   Medication Sig Start Date End Date Taking? Authorizing Provider  acetaminophen  (TYLENOL ) 500 MG tablet Take 500 mg by mouth every 6 (six) hours as needed.    [provider]  apixaban  (ELIQUIS ) 5 MG TABS tablet Take 1 tablet (5 mg total) by mouth 2 (two) times daily. 08/04/22   Fairy Frames, MD  atorvastatin  (LIPITOR) 40 MG tablet Take 40 mg by mouth at bedtime.  01/17/14   [provider]  bisoprolol  (ZEBETA ) 5 MG tablet TAKE 1 TABLET (5 MG TOTAL) BY MOUTH DAILY. 02/17/24   Rolan Ezra RAMAN, MD  clonazePAM  (KLONOPIN ) 0.25 MG disintegrating tablet  Take 1 tablet (0.25 mg total) by mouth 2 (two) times daily as needed (anxiety). 01/09/24   Arfeen, Leni DASEN, MD  Continuous Glucose Sensor (FREESTYLE LIBRE 2 SENSOR) MISC USE AS DIRECTED TO MONITOR GLUCOSE CONTINUOUSLY. CHANGE EVERY 14 DAYS. 84 DAYS    [provider]  cyclobenzaprine  (FLEXERIL ) 10 MG tablet TAKE 1 TABLET BY MOUTH AT BEDTIME AS NEEDED. 02/21/24   Rice, Lonni ORN, MD  dicyclomine  (BENTYL ) 10 MG capsule Take 10 mg by mouth every 8 (eight) hours as needed for spasms (abdominal spasms).    [provider]  doxepin  (SINEQUAN ) 100 MG capsule Take 1 capsule (100 mg total) by mouth at bedtime. 01/09/24 04/08/24  Arfeen, Leni DASEN, MD  FLUoxetine  (PROZAC ) 40 MG capsule TAKE 1 CAPSULE BY MOUTH EVERY DAY 01/09/24   Arfeen, Leni DASEN, MD  Insulin  Degludec (TRESIBA [place]) Inject 68 Units into the skin daily.    [provider]  Insulin  Pen Needle (BD PEN NEEDLE NANO U/F) 32G X 4 MM MISC use daily with insulin  for 90 days 08/26/14   [provider]  ipratropium-albuterol  (DUONEB) 0.5-2.5 (3) MG/3ML SOLN Use 1 vial by nebulization every 6 (six) hours as needed. 08/04/22   Fairy Frames, MD  Lancets [facility] DELICA PLUS [place]) MISC as directed to SMBG three times daily for 90 days    [provider]  omeprazole  (PRILOSEC) 40 MG capsule Take 40 mg by mouth daily before breakfast.  06/07/17  [provider]  ONETOUCH VERIO test strip CHECK SUGARS TWICE A DAY DX  DM2, INSULIN  DEPENDANT 05/15/19   [provider]  predniSONE  (DELTASONE ) 5 MG tablet TAKE 1 TABLET BY MOUTH EVERY DAY WITH BREAKFAST 02/28/24   Rice, Lonni ORN, MD  Semaglutide,0.25 or 0.5MG /DOS, (OZEMPIC, 0.25 OR 0.5 MG/DOSE,) 2 MG/3ML SOPN Inject 1 Units into the skin once a week. Patient not taking: Reported on 05/26/2023    [provider]  spironolactone  (ALDACTONE ) 25 MG tablet Take 0.5 tablets (12.5 mg total) by mouth daily. 08/05/22   Fairy Frames, MD  torsemide  40 MG  TABS Take 40 mg by mouth daily. 03/06/24   Marcine Catalan M, PA-C  VENTOLIN  HFA 108 (90 BASE) MCG/ACT inhaler Inhale 2 puffs into the lungs every 6 (six) hours as needed for wheezing or shortness of breath.  07/05/13   [provider]  ziprasidone  (GEODON ) 20 MG capsule Take 1 capsule (20 mg total) by mouth daily after breakfast. 01/09/24   Arfeen, Leni DASEN, MD    Allergies: Tramadol     Review of Systems  Updated Vital Signs BP 120/78   Pulse 93   Temp 98.5 F (36.9 C) (Oral)   Resp 13   LMP 04/20/2013   SpO2 97%   Physical Exam Vitals and nursing note reviewed.  Constitutional:      Appearance: She is well-developed.  HENT:     Head: Normocephalic and atraumatic. No raccoon eyes or Battle's sign.     Right Ear: Tympanic membrane, ear canal and external ear normal. No hemotympanum.     Left Ear: Tympanic membrane, ear canal and external ear normal. No hemotympanum.     Nose: Nose normal.     Mouth/Throat:     Pharynx: Uvula midline.   Eyes:     General: Lids are normal.     Extraocular Movements:     Right eye: No nystagmus.     Left eye: No nystagmus.     Conjunctiva/sclera: Conjunctivae normal.     Pupils: Pupils are equal, round, and reactive to light.     Comments: No visible hyphema noted   Cardiovascular:     Rate and Rhythm: Normal rate and regular rhythm. Frequent Extrasystoles are present. Pulmonary:     Effort: Pulmonary effort is normal.     Breath sounds: Normal breath sounds.  Abdominal:     Palpations: Abdomen is soft.     Tenderness: There is no abdominal tenderness.   Musculoskeletal:     Right shoulder: Normal range of motion.     Left shoulder: Normal range of motion.     Right elbow: Normal range of motion.     Left elbow: Normal range of motion.     Right wrist: Normal range of motion.     Left wrist: Normal range of motion.     Cervical back: Normal range of motion and neck supple. No tenderness or bony tenderness.     Thoracic  back: No tenderness or bony tenderness.     Lumbar back: No tenderness or bony tenderness.     Right hip: No tenderness. Normal range of motion.     Left hip: No tenderness. Normal range of motion.     Right knee: No bony tenderness. Normal range of motion. No tenderness.     Left knee: No bony tenderness. Normal range of motion. Tenderness present.   Skin:    General: Skin is warm and dry.   Neurological:  Mental Status: She is alert and oriented to person, place, and time.     GCS: GCS eye subscore is 4. GCS verbal subscore is 5. GCS motor subscore is 6.     Cranial Nerves: No cranial nerve deficit.     Sensory: No sensory deficit.     Coordination: Coordination normal.     (all labs ordered are listed, but only abnormal results are displayed) Labs Reviewed  COMPREHENSIVE METABOLIC PANEL WITH GFR - Abnormal; Notable for the following components:      Result Value   Glucose, Bld 221 (*)    BUN 43 (*)    Creatinine, Ser 1.67 (*)    GFR, Estimated 34 (*)    All other components within normal limits  CBC - Abnormal; Notable for the following components:   WBC 14.7 (*)    Hemoglobin 9.8 (*)    HCT 34.5 (*)    MCH 22.7 (*)    MCHC 28.4 (*)    RDW 16.6 (*)    All other components within normal limits  URINALYSIS, ROUTINE W REFLEX MICROSCOPIC - Abnormal; Notable for the following components:   APPearance HAZY (*)    Nitrite POSITIVE (*)    Leukocytes,Ua LARGE (*)    Bacteria, UA MANY (*)    All other components within normal limits  PROTIME-INR - Abnormal; Notable for the following components:   Prothrombin Time 16.6 (*)    INR 1.3 (*)    All other components within normal limits  I-STAT CHEM 8, ED - Abnormal; Notable for the following components:   BUN 50 (*)    Creatinine, Ser 1.80 (*)    Glucose, Bld 225 (*)    Calcium , Ion 1.13 (*)    Hemoglobin 11.6 (*)    HCT 34.0 (*)    All other components within normal limits  I-STAT CG4 LACTIC ACID, ED - Abnormal; Notable  for the following components:   Lactic Acid, Venous 3.5 (*)    All other components within normal limits  ETHANOL  I-STAT CG4 LACTIC ACID, ED  SAMPLE TO BLOOD BANK  TROPONIN I (HIGH SENSITIVITY)    ED ECG REPORT   Date: 03/16/2024  Rate: 93  Rhythm: normal sinus rhythm, PAC  QRS Axis: normal  Intervals: normal  ST/T Wave abnormalities: nonspecific ST changes  Conduction Disutrbances:none  Narrative Interpretation:   Old EKG Reviewed: unchanged  I have personally reviewed the EKG tracing and agree with the computerized printout as noted.   Radiology: DG Pelvis Portable Result Date: 03/16/2024 CLINICAL DATA:  Trauma.  Fall. EXAM: PORTABLE PELVIS 1-2 VIEWS COMPARISON:  None Available. FINDINGS: Pelvis is intact with normal and symmetric sacroiliac joints. No acute fracture or dislocation. No aggressive osseous lesion. Visualized sacral arcuate lines are unremarkable. Unremarkable symphysis pubis. There are mild degenerative changes of bilateral hip joints without significant joint space narrowing. Osteophytosis of the superior acetabulum. No radiopaque foreign bodies. IMPRESSION: *No acute osseous abnormality of the pelvis. Electronically Signed   By: Ree Molt M.D.   On: 03/16/2024 10:33   DG Knee Left Port Result Date: 03/16/2024 CLINICAL DATA:  Blunt Trauma.  Fall. EXAM: PORTABLE LEFT KNEE - 1-2 VIEW COMPARISON:  None Available. FINDINGS: No acute fracture or dislocation. No aggressive osseous lesion. There are degenerative changes of the knee joint in the form of mildly reduced tibio-femoral compartment joint space, tibial spiking and osteophytosis. No knee effusion or focal soft tissue swelling. No radiopaque foreign bodies. IMPRESSION: No acute osseous abnormality of  the left knee joint. Electronically Signed   By: Ree Molt M.D.   On: 03/16/2024 10:32   DG Chest Port 1 View Result Date: 03/16/2024 CLINICAL DATA:  Trauma.  Fall. EXAM: PORTABLE CHEST 1 VIEW COMPARISON:   07/26/2022. FINDINGS: Bilateral lung fields are clear. Bilateral costophrenic angles are clear. Normal cardio-mediastinal silhouette. No acute osseous abnormalities. The soft tissues are within normal limits. IMPRESSION: No active disease. Electronically Signed   By: Ree Molt M.D.   On: 03/16/2024 10:29     Procedures   Medications Ordered in the ED  sodium chloride  0.9 % bolus 500 mL (0 mLs Intravenous Stopped 03/16/24 1308)  acetaminophen  (TYLENOL ) tablet 1,000 mg (1,000 mg Oral Given 03/16/24 1313)    ED Course  Patient seen and examined. History obtained directly from patient.  Reviewed recent cardiac notes.  Patient presented as a level 2, downgraded due to age not greater than 65.  Labs/EKG: Ordered CBC white blood cell count elevated but appears chronic at 14.7, hemoglobin at baseline 9.8 (similar past 4 months); lactate elevated at 3.5.  Pending CMP, pending PT/INR ordered as part of trauma order set.  Imaging: Personally reviewed and interpreted imaging including portable left knee, agree negative; portable pelvis x-ray agree negative; portable chest x-ray agree negative no fluid overload.  Pending CT head and cervical spine.  Medications/Fluids: Ordered: 500 cc fluid bolus due to syncope and elevated lactate  Most recent vital signs reviewed and are as follows: BP 120/78   Pulse 93   Temp 98.5 F (36.9 C) (Oral)   Resp 13   Ht 5' 3 (1.6 m)   Wt 106.6 kg   LMP 04/20/2013   SpO2 97%   BMI 41.63 kg/m   Initial impression: Syncope, fall, minor head injury, left knee injury.  Patient does have some high risk syncope factors.  Reviewed previous echo from 08/2023, LV 55-60% with indeterminant diastolic parameters, trivial aortic regurgitation, no aortic stenosis, normal mitral valve.  11:36 AM   Labs personally reviewed and interpreted including: Ordered repeat lactate and troponin.  CMP shows normal electrolytes, glucose 221 with normal anion gap of 15, normal bicarb,  BUN 43 and creatinine of 1.67 which are slightly elevated from baseline, 2 days ago creatinine was 1.5 and BUN of is 40; PT was 16.6 and INR was 1.3.  Imaging personally visualized and interpreted including: CT head and CT cervical spine without evidence of acute injuries.  Reviewed pertinent lab work and imaging with patient at bedside. Questions answered.   Most current vital signs reviewed and are as follows: BP 120/78   Pulse 93   Temp 98.5 F (36.9 C) (Oral)   Resp 13   Ht 5' 3 (1.6 m)   Wt 106.6 kg   LMP 04/20/2013   SpO2 97%   BMI 41.63 kg/m   Plan: We discussed with Dr. Pamella.  Awaiting completion of workup at this time.  1:51 PM Reassessment performed. Patient appears able, family member at bedside.  She corroborates history.  Patient fell straight onto her face without any warning.  Does report dizzy spells where she has to hold onto an object to prevent her from falling that passed quickly.  Patient agrees to admission.  Discussed case with Dr. Pamella.  Labs personally reviewed and interpreted including: Lactate did improve from 3.5 > 1.5; UA is suggestive of UTI with many bacteria, clean-catch, greater than 50 white blood cells per high-power field.   Reviewed pertinent lab work and imaging with  patient at bedside. Questions answered.   Most current vital signs reviewed and are as follows: BP 110/65   Pulse 65   Temp 98.2 F (36.8 C) (Oral)   Resp (!) 8   Ht 5' 3 (1.6 m)   Wt 106.6 kg   LMP 04/20/2013   SpO2 100%   BMI 41.63 kg/m   Plan: Admit to hospital.   Discussed case with Dr. Tobie, [facility], who will see patient.                                   Medical Decision Making Amount and/or Complexity of Data Reviewed Labs: ordered. Radiology: ordered.  Risk OTC drugs. Decision regarding hospitalization.   Patient with high risk syncope, stable in ED.     Final diagnoses:  Syncope, unspecified syncope type    ED Discharge  Orders     None          Desiderio Chew, PA-C 03/16/24 1355    Pamella Ozell LABOR, DO 03/21/24 2039

## 2024-03-16 NOTE — Plan of Care (Signed)

## 2024-03-16 NOTE — ED Notes (Signed)
 Notified 3W secretary because I was forwarded the 2 times I called charge but let them know patient is comoing upstairs

## 2024-03-16 NOTE — ED Notes (Signed)
 Trauma Response Nurse Documentation   [name] is a 63 y.o. female arriving to [name] via RN hospital staff in wheelchair.   On Eliquis  (apixaban ) daily. Trauma was activated as a Level 2 by ED Charge RN based on the following trauma criteria Elderly patients > 65 with head trauma on anti-coagulation (excluding ASA).  Patient cleared for CT by Dr. Pamella. Pt transported to CT with trauma response nurse present to monitor. RN remained with the patient throughout their absence from the department for clinical observation.   GCS 15.  History   Past Medical History:  Diagnosis Date   Acid reflux disease    Anxiety    Asthma    Bronchitis, chronic (HCC)    Effects worse with URI   Chronic pain syndrome    COPD (chronic obstructive pulmonary disease) (HCC)    Depression    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, type 2   Diverticulitis    Dysplasia of cervix (uteri)    patient states she had dysplasia of the uterus stage 4   Endometrial cancer (HCC)    Fibromyalgia    Fibromyalgia    GERD (gastroesophageal reflux disease)    Headache(784.0)    migraine headache   Hyperlipidemia    Hypertension    Kidney stones    at least 6 stones in past   Pneumonia 2019   Restless leg syndrome    Bilateral   Vomiting    last night at 2300 after last round of antibiotic     Past Surgical History:  Procedure Laterality Date   ABDOMINAL HYSTERECTOMY     APPENDECTOMY     APPLICATION OF WOUND VAC N/A 11/01/2012   Procedure: APPLICATION OF WOUND VAC;  Surgeon: Christina LABOR. Cornett, MD;  Location: [facility];  Service: General;  Laterality: N/A;   CHOLECYSTECTOMY     COLONOSCOPY  2013   COLOSTOMY     COLOSTOMY CLOSURE N/A 11/01/2012   Procedure: COLOSTOMY CLOSURE;  Surgeon: Christina LABOR. Cornett, MD;  Location: [facility];  Service: General;  Laterality: N/A;  REPAIR PARASTOMAL HERNIA; REVISION OF COLOSTOMY   COLOSTOMY CLOSURE N/A 05/03/2013   Procedure: COLOSTOMY CLOSURE;   Surgeon: Christina LABOR. Cornett, MD;  Location: [facility];  Service: General;  Laterality: N/A;   DILATION AND CURETTAGE OF UTERUS N/A 05/24/2019   Procedure: DILATATION AND CURETTAGE;  Surgeon: Christina Agent, MD;  Location: [facility];  Service: Gynecology;  Laterality: N/A;   ESOPHAGOGASTRODUODENOSCOPY Left 11/27/2012   Procedure: ESOPHAGOGASTRODUODENOSCOPY (EGD);  Surgeon: [facility] LITTIE Celestia Mickey., MD;  Location: [facility];  Service: Gastroenterology;  Laterality: Left;   INCISIONAL HERNIA REPAIR N/A 11/01/2012   Procedure: HERNIA REPAIR INCISIONAL;  Surgeon: Christina LABOR. Cornett, MD;  Location: [facility];  Service: General;  Laterality: N/A;   LAPAROTOMY  11/01/2012   Procedure: EXPLORATORY LAPAROTOMY;  Surgeon: Christina LABOR. Cornett, MD;  Location: [facility];  Service: General;;  exploratory laparotomy,repair of parastomal hernia and incisional hernia, lysis of adhesions, and application of wound V.A.C.   LYSIS OF ADHESION N/A 11/01/2012   Procedure: LYSIS OF ADHESION;  Surgeon: Christina LABOR. Cornett, MD;  Location: [facility];  Service: General;  Laterality: N/A;   OPERATIVE ULTRASOUND N/A 06/14/2019   Procedure: OPERATIVE ULTRASOUND;  Surgeon: Christina Agent, MD;  Location: [facility];  Service: Urology;  Laterality: N/A;   PARASTOMAL HERNIA REPAIR N/A 11/01/2012   Procedure: HERNIA REPAIR PARASTOMAL;  Surgeon: Christina LABOR. Cornett, MD;  Location: [facility];  Service: General;  Laterality: N/A;   PARTIAL HYSTERECTOMY     partial   RIGHT HEART CATH N/A 07/26/2022   Procedure: RIGHT HEART CATH;  Surgeon: [facility] Ezra RAMAN, MD;  Location: [facility] INVASIVE CV LAB;  Service: Cardiovascular;  Laterality: N/A;   TANDEM RING INSERTION N/A 05/24/2019   Procedure: INSERTION OF HYMEN CAPSULE;  Surgeon: Christina Agent, MD;  Location: [facility];  Service: Gynecology;  Laterality: N/A;   TANDEM RING INSERTION N/A 06/14/2019   Procedure: HYMEN CAPSULE PLACEMENT;  Surgeon: [facility] Agent, MD;  Location: [facility];  Service: Urology;  Laterality: N/A;     Initial Focused  Assessment (If applicable, or please see trauma documentation): Airway: intact, patent, missing teeth Breathing: No CP.  Breath sounds clear, equal bilaterally. C/O a little SOB.  SpO2 100% on RA. Circulation: No external hemorrhage present. Pulses intact peripherally and centrally.  A-fib present on the monitor in the 90's.  SBP: WDL Disability: PERRLA 2's brisk, MAE equally with equal sensation throughout. C/O no neck or back pain.   CT's Completed:   CT Head and CT C-Spine   Interventions:  20G PIV to R AC Labs drawn CXR Pelvic XR CT head and c-spine L knee XR bolus NS Tylenol  given Warm blankets applied FAST performed and negative  Plan for disposition:  Other Awaiting results   Consults completed:  none at 1100.  Event Summary: Pt was brought to the ED by in-hospital RNs after pt had a fall while accompanying her mother for a scheduled cardioversion.  Pt states that she started to feel dizzy and fell, blacked out and woke up on the floor.  Pt did strike her head and is on eliquis  for a-fib.  Pt did take her morning dose.  Pt states that she is not having any neck pain.  A/O x4 and GCS 15. VS WDL.  Pt was downgraded to a nontrauma at 1038 due to her age being under 69 and not having any other leveling criteria met at this time.   Bedside handoff with ED RN [name].    [name]  Trauma Response RN  Please call [facility] at [phone] for further assistance.

## 2024-03-16 NOTE — ED Triage Notes (Addendum)
 Pt came in as L2 FOT. Was here for was here in hospital to visit family.  She was walking down a hallway and said her feet started to feel funny.  Next thing she knew she was on the floor.  Pt states witnesses said she had a syncopal episode and hit her head. Only complains of left knee pain. Pt is A&O x4.  PT is on Eliquis .

## 2024-03-16 NOTE — Progress Notes (Signed)
   03/16/24 1000  Spiritual Encounters  Type of Visit Initial  Care provided to: Patient  Referral source Trauma page  Reason for visit Trauma  OnCall Visit Yes   Chaplain responded to level 2 trauma.  Upon entering the room patient is being cared for by interdisciplinary team. Currently no family at bedside.  Chaplain spiritual support services remain available as the need arises.

## 2024-03-16 NOTE — Progress Notes (Signed)
 Orthopedic Tech Progress Note Patient Details:  [name] [birth year] [id number]  Patient ID: [name], female   DOB: 06-07-1961, 63 y.o.   MRN: [id number] Checked in for level 2 trauma.  [name] 03/16/2024, 10:34 AM

## 2024-03-17 ENCOUNTER — Inpatient Hospital Stay (HOSPITAL_BASED_OUTPATIENT_CLINIC_OR_DEPARTMENT_OTHER)

## 2024-03-17 DIAGNOSIS — R55 Syncope and collapse: Secondary | ICD-10-CM

## 2024-03-17 LAB — COMPREHENSIVE METABOLIC PANEL WITH GFR
ALT: 5 U/L (ref 0–44)
AST: 14 U/L — ABNORMAL LOW (ref 15–41)
Albumin: 3.1 g/dL — ABNORMAL LOW (ref 3.5–5.0)
Alkaline Phosphatase: 54 U/L (ref 38–126)
Anion gap: 10 (ref 5–15)
BUN: 33 mg/dL — ABNORMAL HIGH (ref 8–23)
CO2: 24 mmol/L (ref 22–32)
Calcium: 9 mg/dL (ref 8.9–10.3)
Chloride: 105 mmol/L (ref 98–111)
Creatinine, Ser: 1.09 mg/dL — ABNORMAL HIGH (ref 0.44–1.00)
GFR, Estimated: 57 mL/min — ABNORMAL LOW (ref >60)
Glucose, Bld: 189 mg/dL — ABNORMAL HIGH (ref 70–99)
Potassium: 4 mmol/L (ref 3.5–5.1)
Sodium: 139 mmol/L (ref 135–145)
Total Bilirubin: 0.4 mg/dL (ref 0.0–1.2)
Total Protein: 6.1 g/dL — ABNORMAL LOW (ref 6.5–8.1)

## 2024-03-17 LAB — CBC
HCT: 28.8 % — ABNORMAL LOW (ref 36.0–46.0)
Hemoglobin: 8.4 g/dL — ABNORMAL LOW (ref 12.0–15.0)
MCH: 23.3 pg — ABNORMAL LOW (ref 26.0–34.0)
MCHC: 29.2 g/dL — ABNORMAL LOW (ref 30.0–36.0)
MCV: 80 fL (ref 80.0–100.0)
Platelets: 279 10*3/uL (ref 150–400)
RBC: 3.6 MIL/uL — ABNORMAL LOW (ref 3.87–5.11)
RDW: 16.7 % — ABNORMAL HIGH (ref 11.5–15.5)
WBC: 8.2 10*3/uL (ref 4.0–10.5)
nRBC: 0 % (ref 0.0–0.2)

## 2024-03-17 LAB — GLUCOSE, CAPILLARY
Glucose-Capillary: 184 mg/dL — ABNORMAL HIGH (ref 70–99)
Glucose-Capillary: 264 mg/dL — ABNORMAL HIGH (ref 70–99)

## 2024-03-17 LAB — ECHOCARDIOGRAM COMPLETE
AR max vel: 2.01 cm2
AV Area VTI: 2.49 cm2
AV Area mean vel: 1.96 cm2
AV Mean grad: 7 mmHg
AV Peak grad: 12.7 mmHg
Ao pk vel: 1.78 m/s
Area-P 1/2: 2.36 cm2
Height: 63 in
S' Lateral: 2.3 cm
Weight: 3760 [oz_av]

## 2024-03-17 MED ORDER — CYCLOBENZAPRINE HCL 10 MG PO TABS
10.0000 mg | ORAL_TABLET | Freq: Once | ORAL | Status: AC
  Administered 2024-03-17: 10 mg via ORAL
  Filled 2024-03-17: qty 1

## 2024-03-17 MED ORDER — CEFADROXIL 500 MG PO CAPS: 500.0000 mg | ORAL_CAPSULE | Freq: Two times a day (BID) | ORAL | 0 refills | Status: DC

## 2024-03-17 MED ORDER — CYCLOBENZAPRINE HCL 10 MG PO TABS: 10.0000 mg | ORAL_TABLET | Freq: Every evening | ORAL | 0 refills | Status: AC | PRN

## 2024-03-17 MED ORDER — ONDANSETRON HCL 4 MG/2ML IJ SOLN: 4.0000 mg | Freq: Four times a day (QID) | INTRAMUSCULAR | Status: DC | PRN

## 2024-03-17 MED ORDER — ONDANSETRON HCL 4 MG/2ML IJ SOLN
INTRAMUSCULAR | Status: AC
  Administered 2024-03-17: 4 mg via INTRAVENOUS
  Filled 2024-03-17: qty 2

## 2024-03-17 MED ORDER — PANTOPRAZOLE SODIUM 40 MG PO TBEC: 40.0000 mg | DELAYED_RELEASE_TABLET | Freq: Two times a day (BID) | ORAL | Status: DC

## 2024-03-17 NOTE — Care Management CC44 (Signed)
 Condition Code 44 Documentation Completed  Patient Details  Name: [name] MRN: [id number] Date of Birth: Mar 24, 1961   Condition Code 44 given:  Yes Patient signature on Condition Code 44 notice:  Yes Documentation of 2 MD's agreement:  Yes Code 44 added to claim:  Yes    Marval Gell, RN 03/17/2024, 3:50 PM

## 2024-03-17 NOTE — Progress Notes (Signed)
 Echocardiogram 2D Echocardiogram has been performed.  Kamilah Correia N Kendrew Paci,RDCS 03/17/2024, 11:03 AM

## 2024-03-17 NOTE — Discharge Summary (Signed)
 Physician Discharge Summary   Patient: [name] MRN: [id number] DOB: December 05, 1960  Admit date:     03/16/2024  Discharge date: 03/17/24  Discharge Physician: Lonni SHAUNNA Dalton   PCP: Loreli Elsie JONETTA Mickey., MD     Recommendations at discharge:  Follow up with PCP Dr. Loreli for syncope Dr. Loreli: Please check BMP and CBC in one week (discharge Cr 1.09, K 4.0, Hgb 8.4) Please adjust torsemide  as appropriate (dose reduced to 20 mg again) Please resume bisoprolol  as appropriate     Discharge Diagnoses: Principal Problem:   Syncope and collapse Other hospital problems   Dehydration, elevated creatinine   Acute cystitis   Anemia of CKD   Morbid obesity   COPD   Hypertension   Diabetes   Fibromyalgia   Compensated cirrhosis   Untreated OSA   Chronic diastolic congestive heart failure   Chronic kidney disease stage IIIa   Pulmonary hypertension   Chronic right heart failure   Paroxysmal atrial fibrillation     Hospital Course: 63 y.o. F with MO, COPD not on home O2, HTN, DM, fibromyalgia, cirrhosis, untreated OSA, dCHF, CKD IIIa, pHTN, RHF and pAF on Eliquis  who presented with syncope.  Patient was present at her hospital for her mother's cardioversion, when she started to have a prodrome, then fell on the floor.  In the ER, CT head, C-spine unremarkable.  X-ray of the chest showed no rib fracture, hip and knee showed no femur fracture or pelvis fracture.  She had an elevated lactic acid, creatinine was 1.8.     Vasovagal syncope due to dehydration MRI brain normal.  Tele overnight normal.  Orthostatics normal.  CR elevation and recently increased bisoprolol  and torsemide  suggest dehydration.  Given fluids overnight and patient felt back to normal.  Torsemide  reduced back to 20 mg.  Bisoprolol  held and doxepin  stopped at discharge.   Elevated creatinine AKI ruled out.  Given fluids and Cr returned to 1.1 near baseline  Acute cystitis Sepsis ruled out.   Urine culture not sent but UA int he ER with copious WBCs.  Discharged on cefadroxil for total 5 days.  Anemia of CKD Cr has been in the 8-9 range since Feb.  No recent bleeding reported, none noted here. - Follow up with PCP             [place]  Controlled Substances Registry was reviewed for this patient prior to discharge.  Consultants: None Procedures performed: MRI brain  Disposition: Home Diet recommendation:  Discharge Diet Orders (From admission, onward)     Start     Ordered   03/17/24 0000  Diet - low sodium heart healthy        03/17/24 1527             DISCHARGE MEDICATION: Allergies as of 03/17/2024       Reactions   Ultram  [tramadol ] Other (See Comments)   Per Dr. Dicky is renal failure   Ozempic (0.25 Or 0.5 Mg-dose) [semaglutide(0.25 Or 0.5mg -dos)] Nausea And Vomiting, Other (See Comments)   Stomach pain No appetite         Medication List     PAUSE taking these medications    bisoprolol  5 MG tablet Wait to take this until your doctor or other care provider tells you to start again. Commonly known as: ZEBETA  TAKE 1 TABLET (5 MG TOTAL) BY MOUTH DAILY.   doxepin  100 MG capsule Wait to take this until your doctor or other care provider  tells you to start again. Commonly known as: SINEQUAN  Take 1 capsule (100 mg total) by mouth at bedtime.   torsemide  20 MG tablet Wait to take this until: March 19, 2024 Commonly known as: DEMADEX  Take 40 mg by mouth daily.       TAKE these medications    acetaminophen  500 MG tablet Commonly known as: TYLENOL  Take 1,500 mg by mouth 2 (two) times daily as needed (pain).   atorvastatin  40 MG tablet Commonly known as: LIPITOR Take 40 mg by mouth at bedtime.   cefadroxil 500 MG capsule Commonly known as: DURICEF Take 1 capsule (500 mg total) by mouth 2 (two) times daily.   clonazePAM  0.25 MG disintegrating tablet Commonly known as: KLONOPIN  Take 1 tablet (0.25 mg total)  by mouth 2 (two) times daily as needed (anxiety).   cyclobenzaprine  10 MG tablet Commonly known as: FLEXERIL  Take 1 tablet (10 mg total) by mouth at bedtime as needed. What changed:  when to take this reasons to take this   Eliquis  5 MG Tabs tablet Generic drug: apixaban  Take 1 tablet (5 mg total) by mouth 2 (two) times daily.   FLUoxetine  40 MG capsule Commonly known as: PROZAC  TAKE 1 CAPSULE BY MOUTH EVERY DAY   omeprazole  40 MG capsule Commonly known as: PRILOSEC Take 40 mg by mouth daily before breakfast.   predniSONE  5 MG tablet Commonly known as: DELTASONE  TAKE 1 TABLET BY MOUTH EVERY DAY WITH BREAKFAST   spironolactone  25 MG tablet Commonly known as: ALDACTONE  Take 0.5 tablets (12.5 mg total) by mouth daily.   Tresiba FlexTouch 200 UNIT/ML FlexTouch Pen Generic drug: insulin  degludec Inject 80 Units into the skin daily.   ziprasidone  20 MG capsule Commonly known as: Geodon  Take 1 capsule (20 mg total) by mouth daily after breakfast.        Follow-up Information     Loreli Elsie JONETTA Mickey., MD. Schedule an appointment as soon as possible for a visit in 1 week(s).   Specialty: Internal Medicine Contact information: [address] [place] [address] [phone]                 Discharge Instructions     Diet - low sodium heart healthy   Complete by: As directed    Discharge instructions   Complete by: As directed    **IMPORTANT DISCHARGE INSTRUCTIONS**   From Dr. Jonel: You were admitted for passing out  Here, we found that you were dehydrated and had a mild kidney failure. You were treated with fluids and the kidney failure resolved  I suspect that this was from the increased doses of torsemide   HOLD torsemide  until Monday On Monday, resume torsemide  AT YOUR OLD DOSE OF 20 MG daily  Go see Dr. Loreli as soon as possible  I also recommend you hold (do no take) the bisoprolol  and doxepin  until you see Dr. Loreli Both of those  medicines can contribute to passing out a little   Increase activity slowly   Complete by: As directed        Discharge Exam: Filed Weights   03/16/24 1042  Weight: 106.6 kg    General: Pt is alert, awake, not in acute distress Cardiovascular: RRR, nl S1-S2, no murmurs appreciated.   No LE edema.   Respiratory: Normal respiratory rate and rhythm.  CTAB without rales or wheezes. Abdominal: Abdomen soft and non-tender.  No distension or HSM.   Neuro/Psych: Strength symmetric in upper and lower extremities.  Judgment and insight  appear normal.   Condition at discharge: good  The results of significant diagnostics from this hospitalization (including imaging, microbiology, ancillary and laboratory) are listed below for reference.   Imaging Studies: ECHOCARDIOGRAM COMPLETE Result Date: 03/17/2024    ECHOCARDIOGRAM REPORT   Patient Name:   Sandeep B Councilman Date of Exam: 03/17/2024 Medical Rec #:  [id number]          Height:       63.0 in Accession #:    [id number]         Weight:       235.0 lb Date of Birth:  [birth year]          BSA:          2.071 m Patient Age:    62 years           BP:           113/53 mmHg Patient Gender: F                  HR:           75 bpm. Exam Location:  Inpatient Procedure: 2D Echo, 3D Echo, Cardiac Doppler, Color Doppler and Strain Analysis            (Both Spectral and Color Flow Doppler were utilized during            procedure). Indications:    Syncope  History:        Patient has prior history of Echocardiogram examinations, most                 recent 09/06/2023. COPD; Risk Factors:Hypertension, Diabetes,                 Dyslipidemia and Former Smoker.  Sonographer:    Logan Shove RDCS Referring Phys: [phone] EKTA V PATEL IMPRESSIONS  1. Left ventricular ejection fraction, by estimation, is 70 to 75%. The left ventricle has hyperdynamic function. The left ventricle has no regional wall motion abnormalities. There is mild left ventricular hypertrophy. Left  ventricular diastolic parameters are consistent with Grade I diastolic dysfunction (impaired relaxation). The average left ventricular global longitudinal strain is -21.5 %. The global longitudinal strain is normal.  2. Right ventricular systolic function is normal. The right ventricular size is normal. Tricuspid regurgitation signal is inadequate for assessing PA pressure.  3. The mitral valve is normal in structure. Trivial mitral valve regurgitation. No evidence of mitral stenosis.  4. The aortic valve is tricuspid. Aortic valve regurgitation is not visualized. No aortic stenosis is present.  5. The inferior vena cava is normal in size with greater than 50% respiratory variability, suggesting right atrial pressure of 3 mmHg. FINDINGS  Left Ventricle: Left ventricular ejection fraction, by estimation, is 70 to 75%. The left ventricle has hyperdynamic function. The left ventricle has no regional wall motion abnormalities. The average left ventricular global longitudinal strain is -21.5  %. Strain was performed and the global longitudinal strain is normal. The left ventricular internal cavity size was normal in size. There is mild left ventricular hypertrophy. Left ventricular diastolic parameters are consistent with Grade I diastolic dysfunction (impaired relaxation). Normal left ventricular filling pressure. Right Ventricle: The right ventricular size is normal. Right vetricular wall thickness was not well visualized. Right ventricular systolic function is normal. Tricuspid regurgitation signal is inadequate for assessing PA pressure. Left Atrium: Left atrial size was normal in size. Right Atrium: Right atrial size was normal in size. Pericardium: There is no evidence  of pericardial effusion. Mitral Valve: The mitral valve is normal in structure. Trivial mitral valve regurgitation. No evidence of mitral valve stenosis. Tricuspid Valve: The tricuspid valve is normal in structure. Tricuspid valve regurgitation is  trivial. No evidence of tricuspid stenosis. Aortic Valve: The aortic valve is tricuspid. Aortic valve regurgitation is not visualized. No aortic stenosis is present. Aortic valve mean gradient measures 7.0 mmHg. Aortic valve peak gradient measures 12.7 mmHg. Aortic valve area, by VTI measures 2.49  cm. Pulmonic Valve: The pulmonic valve was not well visualized. Pulmonic valve regurgitation is not visualized. No evidence of pulmonic stenosis. Aorta: The aortic root and ascending aorta are structurally normal, with no evidence of dilitation. Venous: The inferior vena cava is normal in size with greater than 50% respiratory variability, suggesting right atrial pressure of 3 mmHg. IAS/Shunts: No atrial level shunt detected by color flow Doppler. Additional Comments: 3D was performed not requiring image post processing on an independent workstation and was normal.  LEFT VENTRICLE PLAX 2D LVIDd:         4.20 cm   Diastology LVIDs:         2.30 cm   LV e' medial:    7.40 cm/s LV PW:         1.20 cm   LV E/e' medial:  10.3 LV IVS:        1.10 cm   LV e' lateral:   9.14 cm/s LVOT diam:     1.90 cm   LV E/e' lateral: 8.3 LV SV:         79 LV SV Index:   38        2D Longitudinal Strain LVOT Area:     2.84 cm  2D Strain GLS Avg:     -21.5 %                           3D Volume EF:                          3D EF:        56 %                          LV EDV:       120 ml                          LV ESV:       53 ml                          LV SV:        67 ml RIGHT VENTRICLE          IVC RV Basal diam:  3.40 cm  IVC diam: 1.30 cm LEFT ATRIUM             Index        RIGHT ATRIUM           Index LA diam:        3.80 cm 1.84 cm/m   RA Area:     12.10 cm LA Vol (A2C):   66.0 ml 31.87 ml/m  RA Volume:   24.70 ml  11.93 ml/m LA Vol (A4C):   24.9 ml 12.02 ml/m LA Biplane Vol: 42.5 ml 20.52 ml/m  AORTIC VALVE AV Area (Vmax):  2.01 cm AV Area (Vmean):   1.96 cm AV Area (VTI):     2.49 cm AV Vmax:           178.00 cm/s AV  Vmean:          122.000 cm/s AV VTI:            0.316 m AV Peak Grad:      12.7 mmHg AV Mean Grad:      7.0 mmHg LVOT Vmax:         126.00 cm/s LVOT Vmean:        84.400 cm/s LVOT VTI:          0.278 m LVOT/AV VTI ratio: 0.88  AORTA Ao Root diam: 2.70 cm Ao Asc diam:  3.00 cm MITRAL VALVE MV Area (PHT): 2.36 cm     SHUNTS MV Decel Time: 322 msec     Systemic VTI:  0.28 m MV E velocity: 76.00 cm/s   Systemic Diam: 1.90 cm MV A velocity: 111.00 cm/s MV E/A ratio:  0.68 Dorn Ross MD Electronically signed by Dorn Ross MD Signature Date/Time: 03/17/2024/12:37:29 PM    Final    MR BRAIN WO CONTRAST Result Date: 03/16/2024 CLINICAL DATA:  Syncope/presyncope, cerebrovascular cause suspected EXAM: MRI HEAD WITHOUT CONTRAST TECHNIQUE: Multiplanar, multiecho pulse sequences of the brain and surrounding structures were obtained without intravenous contrast. COMPARISON:  Same day CT head. FINDINGS: Brain: No acute infarction, hemorrhage, hydrocephalus, extra-axial collection or mass lesion. Vascular: Normal flow voids. Skull and upper cervical spine: Normal marrow signal. Sinuses/Orbits: Negative. IMPRESSION: Normal brain MRI. No acute abnormality. Electronically Signed   By: Gilmore GORMAN Molt M.D.   On: 03/16/2024 19:39   CT HEAD WO CONTRAST Result Date: 03/16/2024 CLINICAL DATA:  Provided history: Head trauma, moderate/severe. Polytrauma, blunt. EXAM: CT HEAD WITHOUT CONTRAST CT CERVICAL SPINE WITHOUT CONTRAST TECHNIQUE: Multidetector CT imaging of the head and cervical spine was performed following the standard protocol without intravenous contrast. Multiplanar CT image reconstructions of the cervical spine were also generated. RADIATION DOSE REDUCTION: This exam was performed according to the departmental dose-optimization program which includes automated exposure control, adjustment of the mA and/or kV according to patient size and/or use of iterative reconstruction technique. COMPARISON:  Cervical spine  radiographs 04/30/2010. FINDINGS: CT HEAD FINDINGS Brain: There is no acute intracranial hemorrhage. No demarcated cortical infarct. No extra-axial fluid collection. No evidence of an intracranial mass. No midline shift. Vascular: No hyperdense vessel.  Atherosclerotic calcifications. Skull: No calvarial fracture or aggressive osseous lesion. Sinuses/Orbits: No mass or acute finding within the imaged orbits. Minimal mucosal thickening within the right maxillary sinus. CT CERVICAL SPINE FINDINGS Alignment: Slight grade 1 anterolisthesis at C2-C3, C3-C4, C4-C5 and C5-C6. Skull base and vertebrae: The basion-dental and atlanto-dental intervals are maintained.No evidence of acute fracture to the cervical spine. Soft tissues and spinal canal: No prevertebral fluid or swelling. No visible canal hematoma. Disc levels: Mild cervical spondylosis. No significant spinal canal stenosis is appreciated. No significant bony neural foraminal narrowing. Upper chest: No consolidation within the imaged lung apices. No visible pneumothorax. Paraseptal emphysema. Partially imaged left apical bulla. IMPRESSION: CT head: 1. No evidence of an acute intracranial abnormality. 2. Minor right maxillary sinus mucosal thickening. CT cervical spine: 1. No evidence of an acute cervical spine fracture. 2. Mild grade 1 anterolisthesis at C2-C3, C3-C4, C4-C5 and C5-C6. 3. Mild cervical spondylosis. 4. Paraseptal emphysema. Partially imaged left apical bulla. Electronically Signed   By: Rockey Childs D.O.   On:  03/16/2024 11:30   CT CERVICAL SPINE WO CONTRAST Result Date: 03/16/2024 CLINICAL DATA:  Provided history: Head trauma, moderate/severe. Polytrauma, blunt. EXAM: CT HEAD WITHOUT CONTRAST CT CERVICAL SPINE WITHOUT CONTRAST TECHNIQUE: Multidetector CT imaging of the head and cervical spine was performed following the standard protocol without intravenous contrast. Multiplanar CT image reconstructions of the cervical spine were also generated.  RADIATION DOSE REDUCTION: This exam was performed according to the departmental dose-optimization program which includes automated exposure control, adjustment of the mA and/or kV according to patient size and/or use of iterative reconstruction technique. COMPARISON:  Cervical spine radiographs 04/30/2010. FINDINGS: CT HEAD FINDINGS Brain: There is no acute intracranial hemorrhage. No demarcated cortical infarct. No extra-axial fluid collection. No evidence of an intracranial mass. No midline shift. Vascular: No hyperdense vessel.  Atherosclerotic calcifications. Skull: No calvarial fracture or aggressive osseous lesion. Sinuses/Orbits: No mass or acute finding within the imaged orbits. Minimal mucosal thickening within the right maxillary sinus. CT CERVICAL SPINE FINDINGS Alignment: Slight grade 1 anterolisthesis at C2-C3, C3-C4, C4-C5 and C5-C6. Skull base and vertebrae: The basion-dental and atlanto-dental intervals are maintained.No evidence of acute fracture to the cervical spine. Soft tissues and spinal canal: No prevertebral fluid or swelling. No visible canal hematoma. Disc levels: Mild cervical spondylosis. No significant spinal canal stenosis is appreciated. No significant bony neural foraminal narrowing. Upper chest: No consolidation within the imaged lung apices. No visible pneumothorax. Paraseptal emphysema. Partially imaged left apical bulla. IMPRESSION: CT head: 1. No evidence of an acute intracranial abnormality. 2. Minor right maxillary sinus mucosal thickening. CT cervical spine: 1. No evidence of an acute cervical spine fracture. 2. Mild grade 1 anterolisthesis at C2-C3, C3-C4, C4-C5 and C5-C6. 3. Mild cervical spondylosis. 4. Paraseptal emphysema. Partially imaged left apical bulla. Electronically Signed   By: Rockey Childs D.O.   On: 03/16/2024 11:30   DG Pelvis Portable Result Date: 03/16/2024 CLINICAL DATA:  Trauma.  Fall. EXAM: PORTABLE PELVIS 1-2 VIEWS COMPARISON:  None Available.  FINDINGS: Pelvis is intact with normal and symmetric sacroiliac joints. No acute fracture or dislocation. No aggressive osseous lesion. Visualized sacral arcuate lines are unremarkable. Unremarkable symphysis pubis. There are mild degenerative changes of bilateral hip joints without significant joint space narrowing. Osteophytosis of the superior acetabulum. No radiopaque foreign bodies. IMPRESSION: *No acute osseous abnormality of the pelvis. Electronically Signed   By: Ree Molt M.D.   On: 03/16/2024 10:33   DG Knee Left Port Result Date: 03/16/2024 CLINICAL DATA:  Blunt Trauma.  Fall. EXAM: PORTABLE LEFT KNEE - 1-2 VIEW COMPARISON:  None Available. FINDINGS: No acute fracture or dislocation. No aggressive osseous lesion. There are degenerative changes of the knee joint in the form of mildly reduced tibio-femoral compartment joint space, tibial spiking and osteophytosis. No knee effusion or focal soft tissue swelling. No radiopaque foreign bodies. IMPRESSION: No acute osseous abnormality of the left knee joint. Electronically Signed   By: Ree Molt M.D.   On: 03/16/2024 10:32   DG Chest Port 1 View Result Date: 03/16/2024 CLINICAL DATA:  Trauma.  Fall. EXAM: PORTABLE CHEST 1 VIEW COMPARISON:  07/26/2022. FINDINGS: Bilateral lung fields are clear. Bilateral costophrenic angles are clear. Normal cardio-mediastinal silhouette. No acute osseous abnormalities. The soft tissues are within normal limits. IMPRESSION: No active disease. Electronically Signed   By: Ree Molt M.D.   On: 03/16/2024 10:29    Microbiology: Results for orders placed or performed during the hospital encounter of 03/16/24  Culture, blood (Routine X 2) w Reflex to  ID Panel     Status: None (Preliminary result)   Collection Time: 03/16/24  2:42 PM   Specimen: BLOOD  Result Value Ref Range Status   Specimen Description BLOOD SITE NOT SPECIFIED  Final   Special Requests   Final    BOTTLES DRAWN AEROBIC AND ANAEROBIC  Blood Culture adequate volume   Culture   Final    NO GROWTH < 24 HOURS Performed at [facility], [address] [place], [place] [address]    Report Status PENDING  Incomplete  Culture, blood (Routine X 2) w Reflex to ID Panel     Status: None (Preliminary result)   Collection Time: 03/16/24  5:59 PM   Specimen: BLOOD  Result Value Ref Range Status   Specimen Description BLOOD SITE NOT SPECIFIED  Final   Special Requests   Final    BOTTLES DRAWN AEROBIC AND ANAEROBIC Blood Culture adequate volume   Culture   Final    NO GROWTH < 24 HOURS Performed at [facility], [address], [place], [place] [address]    Report Status PENDING  Incomplete    Labs: CBC: Recent Labs  Lab 03/16/24 1008 03/16/24 1019 03/16/24 1444 03/17/24 0616  WBC 14.7*  --  12.0* 8.2  HGB 9.8* 11.6* 9.2* 8.4*  HCT 34.5* 34.0* 31.7* 28.8*  MCV 80.0  --  78.3* 80.0  PLT 295  --  228 279   Basic Metabolic Panel: Recent Labs  Lab 03/14/24 1402 03/16/24 1008 03/16/24 1019 03/16/24 1444 03/17/24 0616  NA 138 137 136  --  139  K 4.7 4.5 4.7  --  4.0  CL 100 99 100  --  105  CO2 28 23  --   --  24  GLUCOSE 174* 221* 225*  --  189*  BUN 40* 43* 50*  --  33*  CREATININE 1.50* 1.67* 1.80* 1.33* 1.09*  CALCIUM  9.4 9.6  --   --  9.0  MG  --   --   --  1.6*  --    Liver Function Tests: Recent Labs  Lab 03/16/24 1008 03/17/24 0616  AST 17 14*  ALT 7 5  ALKPHOS 59 54  BILITOT 0.5 0.4  PROT 7.3 6.1*  ALBUMIN  3.6 3.1*   CBG: Recent Labs  Lab 03/16/24 1653 03/16/24 2109 03/17/24 0610 03/17/24 1332  GLUCAP 244* 240* 184* 264*    Discharge time spent: approximately 45 minutes spent on discharge counseling, evaluation of patient on day of discharge, and coordination of discharge planning with nursing, social work, pharmacy and case management  Signed: Lonni SHAUNNA Dalton, MD [facility] 03/17/2024

## 2024-03-17 NOTE — Care Management Obs Status (Signed)
 MEDICARE OBSERVATION STATUS NOTIFICATION   Patient Details  Name: [name] MRN: [id number] Date of Birth: Oct 26, 1960   Medicare Observation Status Notification Given:  Yes    Marval Gell, RN 03/17/2024, 3:50 PM

## 2024-03-21 LAB — CULTURE, BLOOD (ROUTINE X 2)
Culture: NO GROWTH
Culture: NO GROWTH
Special Requests: ADEQUATE
Special Requests: ADEQUATE

## 2024-03-22 DIAGNOSIS — I5032 Chronic diastolic (congestive) heart failure: Secondary | ICD-10-CM | POA: Diagnosis not present

## 2024-03-22 DIAGNOSIS — K219 Gastro-esophageal reflux disease without esophagitis: Secondary | ICD-10-CM | POA: Diagnosis not present

## 2024-03-22 DIAGNOSIS — M353 Polymyalgia rheumatica: Secondary | ICD-10-CM | POA: Diagnosis not present

## 2024-03-22 DIAGNOSIS — D649 Anemia, unspecified: Secondary | ICD-10-CM | POA: Diagnosis not present

## 2024-03-22 DIAGNOSIS — R55 Syncope and collapse: Secondary | ICD-10-CM | POA: Diagnosis not present

## 2024-03-22 DIAGNOSIS — I130 Hypertensive heart and chronic kidney disease with heart failure and stage 1 through stage 4 chronic kidney disease, or unspecified chronic kidney disease: Secondary | ICD-10-CM | POA: Diagnosis not present

## 2024-03-22 DIAGNOSIS — I480 Paroxysmal atrial fibrillation: Secondary | ICD-10-CM | POA: Diagnosis not present

## 2024-03-22 DIAGNOSIS — N1832 Chronic kidney disease, stage 3b: Secondary | ICD-10-CM | POA: Diagnosis not present

## 2024-03-22 DIAGNOSIS — E139 Other specified diabetes mellitus without complications: Secondary | ICD-10-CM | POA: Diagnosis not present

## 2024-03-27 DIAGNOSIS — D649 Anemia, unspecified: Secondary | ICD-10-CM

## 2024-03-27 HISTORY — DX: Anemia, unspecified: D64.9

## 2024-03-30 NOTE — Progress Notes (Signed)
 [facility]   Primary Care: Loreli Elsie JONETTA Mickey., MD Pulmonary: Dr. Alaine  HF Cardiologist: Dr. Rolan  HPI: 63 y.o. female smoker w/ h/o COPD, HTN, HLD, Type 2 DM, chronic pain syndrome/ fibromyalgia and pulmonary hypertension with RV failure/cor pulmonale.  She was admitted 11/23 with respiratory distress, treated with BiPAP. SCr elevated and unable to get CT chest. Echo showed EF 60-65%, severe RV failure and possible McConnell's sign, RA mod dilated, no MR. Felt respiratory distress related to CHF/RV failure. She was diuresed with IV lasix . RUQ US  limited due to patient habitus but suggestive of possible liver disease/cirrhosis (coarsened hepatic echotexture). She underwent RHC showing low PCWP and elevated right heart pressures, suggesting cor pulmonale. V/Q scan negative for PE. She had a significant oxygen  requirement and lung parenchymal disease felt to be significant. Continued to treat with steroids and oxygen .  GDMT initiated but limited by need for low-dose midodrine . She was discharged home on oxygen , weight 189 lbs.  Zio monitor in 1/24 showed multiple SVT episodes, possible atrial tachycardia, no atrial fibrillation.  Bisoprolol  was started.   CT chest in 2/24 showed possible smoking-related ILD (respiratory bronchiolitis).    Echo 12/24 EF 60-65%, normal RV, IVC normal, unable to estimate PA systolic pressure.   She quit smoking, 11/23.   Of note, no longer on Tyvaso . She stopped Tyvaso  during a severe URI.  When she tried to start it back, she developed severe headaches and worsening flu-like symptoms, so she stayed off it.  She does not feel any worse off Tyvaso .  In fact, she has improved a lot since quitting cigarettes.    Admitted 6/25 with syncope. MRI brain normal, orthostatics negative. Echo showed EF 70-75%, G1DD, normal RV. Syncope felt to be related to hypovolemia. Torsemide  held and given IVF and discharged home.  Today she returns for HF  follow up. Overall feeling fair  Feels more SOB, but episodes of dyspnea are occasional. Does OK with ADLs and walking on flat ground. Denies palpitations, abnormal bleeding, CP, dizziness, edema, or PND/Orthopnea. Appetite ok. Not weighing at home. Taking all medications. No ETOH, tobacco or drug use. Declines today. She snores.  ECG (personally reviewed): none ordered today.  Labs (11/23): K 4.0, creatinine 1.08 Labs (12/23): K 3.5, creatinine 0.99 Labs (3/24): K 4.0, creatinine 1.46 Labs (6/24): K 4.3, creatinine 1.13 Labs (9/24): K 4, creatinine 1.14 Labs (6/25): K 4.0, creatinine 1.09  6 minute walk (3/24): 244 m 6 minute walk (9/24): 305 m  PMH: 1. COPD: Prior smoking.  2. 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   - CT chest (2/24) with suspected respiratory bronchiolitis-related ILD.  3. OSA: Cannot tolerate CPAP.  4. Atrial fibrillation: Paroxysmal.  5. Atrial tachycardia: Zio monitor in 1/24 showed multiple SVT episodes, possible atrial tachycardia, no atrial fibrillation.  6. RV failiure/cor pulmonale:  - Echo (11/23): EF 60-65%, grade I DD,  RV severely reduced systolic function.  - RHC (11/23): RA mean 11, PA 44/19 mean 28, PCWP mean 4, CO/CI (Fick) 5.46/2.65, PVR 4.4, PAPi 2.3  - V/Q scan (11/23): No evidence for chronic PE.  - Echo (12/24): EF 60-65%, normal RV, IVC normal, unable to estimate PA systolic pressure.  - Echo 6/25: EF 70-75%, G1DD, normal RV. Unable to estimate PA systolic pressure 7. Cirrhosis: Possibly due to RV failure.  8. 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 HTN 10. Hyperlipidemia 11. Fibromyalgia 12. H/o endometrial cancer 13. H/o nephrolithiasis  Current Outpatient Medications  Medication Sig Dispense Refill   acetaminophen  (TYLENOL ) 500 MG tablet Take 1,500 mg by mouth 2 (two) times daily as needed (pain).     apixaban  (ELIQUIS ) 5 MG TABS tablet Take 1 tablet (5 mg total) by mouth 2 (two) times daily. 60 tablet  0   atorvastatin  (LIPITOR) 40 MG tablet Take 40 mg by mouth at bedtime.      bisoprolol  (ZEBETA ) 5 MG tablet TAKE 1 TABLET (5 MG TOTAL) BY MOUTH DAILY. 90 tablet 3   cefadroxil  (DURICEF) 500 MG capsule Take 1 capsule (500 mg total) by mouth 2 (two) times daily. 8 capsule 0   clonazePAM  (KLONOPIN ) 0.25 MG disintegrating tablet Take 1 tablet (0.25 mg total) by mouth 2 (two) times daily as needed (anxiety). 60 tablet 2   cyclobenzaprine  (FLEXERIL ) 10 MG tablet Take 1 tablet (10 mg total) by mouth at bedtime as needed. 30 tablet 0   dicyclomine  (BENTYL ) 10 MG capsule Take 10 mg by mouth 4 (four) times daily -  before meals and at bedtime.     doxepin  (SINEQUAN ) 100 MG capsule Take 1 capsule (100 mg total) by mouth at bedtime. 30 capsule 2   FLUoxetine  (PROZAC ) 40 MG capsule TAKE 1 CAPSULE BY MOUTH EVERY DAY 30 capsule 2   insulin  degludec (TRESIBA FLEXTOUCH) 200 UNIT/ML FlexTouch Pen Inject 80 Units into the skin daily.     iron polysaccharides (NIFEREX) 150 MG capsule Take 150 mg by mouth daily.     NOVOLOG  FLEXPEN 100 UNIT/ML FlexPen Inject 10 Units into the skin 3 (three) times daily with meals.     omeprazole  (PRILOSEC) 40 MG capsule Take 40 mg by mouth daily before breakfast.   6   predniSONE  (DELTASONE ) 5 MG tablet TAKE 1 TABLET BY MOUTH EVERY DAY WITH BREAKFAST 30 tablet 1   spironolactone  (ALDACTONE ) 25 MG tablet Take 0.5 tablets (12.5 mg total) by mouth daily. 30 tablet 0   torsemide  (DEMADEX ) 20 MG tablet Take 40 mg by mouth daily.     ziprasidone  (GEODON ) 20 MG capsule Take 1 capsule (20 mg total) by mouth daily after breakfast. 30 capsule 2   No current facility-administered medications for this encounter.   Allergies  Allergen Reactions   Ultram  [Tramadol ] Other (See Comments)    Per Dr. Dicky is renal failure   Ozempic (0.25 Or 0.5 Mg-Dose) [Semaglutide(0.25 Or 0.5mg -Dos)] Nausea And Vomiting and Other (See Comments)    Stomach pain No appetite    Social History    Socioeconomic History   Marital status: Married    Spouse name: Not on file   Number of children: Not on file   Years of education: Not on file   Highest education level: Not on file  Occupational History   Not on file  Tobacco Use   Smoking status: Former    Current packs/day: 0.00    Types: Cigarettes    Quit date: 2010    Years since quitting: 15.5   Smokeless tobacco: Never   Tobacco comments:    about one pack a day on a bad day  Vaping Use   Vaping status: Former  Substance and Sexual Activity   Alcohol  use: No   Drug use: No   Sexual activity: Never    Birth control/protection: None  Other Topics Concern   Not on file  Social History Narrative   Not on file   Social Drivers of Health   Financial Resource Strain: Not on file  Food Insecurity: No Food Insecurity (  03/17/2024)   Hunger Vital Sign    Worried About Running Out of Food in the Last Year: Never true    Ran Out of Food in the Last Year: Never true  Transportation Needs: No Transportation Needs (03/17/2024)   PRAPARE - Administrator, Civil Service (Medical): No    Lack of Transportation (Non-Medical): No  Physical Activity: Not on file  Stress: Not on file  Social Connections: Not on file  Intimate Partner Violence: Not At Risk (03/17/2024)   Humiliation, Afraid, Rape, and Kick questionnaire    Fear of Current or Ex-Partner: No    Emotionally Abused: No    Physically Abused: No    Sexually Abused: No   Family History  Problem Relation Age of Onset   Diabetes Mother    Throat cancer Mother    Uterine cancer Sister    Deep vein thrombosis Neg Hx    Pulmonary embolism Neg Hx    ROS: All systems reviewed and negative except as per HPI.  Wt Readings from Last 3 Encounters:  04/03/24 116.8 kg (257 lb 6.4 oz)  03/16/24 106.6 kg (235 lb)  03/06/24 106.8 kg (235 lb 6.4 oz)   BP 116/78   Pulse 78   Ht 5' 3 (1.6 m)   Wt 116.8 kg (257 lb 6.4 oz)   LMP 04/20/2013   SpO2 97%   BMI  45.60 kg/m   PHYSICAL EXAM: General:  NAD. No resp difficulty, walked into clinic HEENT: Normal Neck: Supple. No JVD. Thick neck Cor: Regular rate & rhythm. No rubs, gallops or murmurs. Lungs: Clear Abdomen: Soft, obese, nontender, nondistended.  Extremities: No cyanosis, clubbing, rash, edema Neuro: Alert & oriented x 3, moves all 4 extremities w/o difficulty. Affect pleasant.  ASSESSMENT & PLAN: 1. RV failure/cor pulmonale: Echo 11/23 showed EF 60-65%, D-shaped septum, moderate RV enlargement with severely decreased RV systolic function, IVC dilated. RHC with low PCWP, elevated right-sided filling pressures, mild PAH, preserved cardiac output. Suspect RV failure due to parenchymal lung disease (emphysema, smoking-related ILD noted on HRCT 4/23). Echo 12/24 showed EF 60-65%, normal RV, IVC normal, unable to estimate PA systolic pressure.  Last BNP was normal. Suspect respiratory bronchiolitis has improved now that she has quit smoking.  She is off Tyvaso  and is not feeling any worse off it. Echo 6/25 showed EF 70-75%, G1DD, normal RV. NYHA II, stable. Volume status difficult due to body habitus, but she does not appear volume overloaded. - Continue torsemide  20 mg daily. BMET and BNP today.  - Continue spironolactone  12.5 mg daily.  - Now off Farxiga  per PCP over concern for autoimmune DM and increased risk of DKA. 2. 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  CT chests have shown emphysema and smoking-related respiratory bronchiolitis with ILD.  V/Q scan in 11/23 did not show evidence for PE. She started on Tyvaso  for PH-ILD.  However, she is now off Tyvaso  and feels no worse off it.  RV looked improved on echo 12/24.  Echo 6/25 RV appears normal. Respiratory bronchiolitis may have significantly improved off smoking.  - I think she can stay off Tyvaso .  - She declines today. 3. Chronic hypoxemic respiratory failure: Baseline respiratory bronchiolitis-ILD  + COPD.  Now off oxygen , much improved. - Follows with Pulmonary. 4. COPD: Emphysema on chest imaging.  Quit smoking 11/23.  - Congratulated. No change. 5. Atrial fibrillation: Paroxysmal. Regular on exam today. - Continue Eliquis  5 mg bid. Denies  abnormal bleeding. Check CBC  - Continue bisoprolol  5 mg daily.  6. Cirrhosis: Possible cirrhosis by RUQ US .  This may be due to RV failure.  7. Obesity: Body mass index is 45.6 kg/m. - Unable to get GLP-1 agonist.  - Needs weight loss 8. Snoring: arrange sleep study.  Follow up in 6 months with Dr. Rolan Raisin [facility], FNP-BC 04/03/2024

## 2024-04-01 ENCOUNTER — Other Ambulatory Visit (HOSPITAL_COMMUNITY): Payer: Self-pay | Admitting: Psychiatry

## 2024-04-01 DIAGNOSIS — F4310 Post-traumatic stress disorder, unspecified: Secondary | ICD-10-CM

## 2024-04-01 DIAGNOSIS — F411 Generalized anxiety disorder: Secondary | ICD-10-CM

## 2024-04-01 DIAGNOSIS — F331 Major depressive disorder, recurrent, moderate: Secondary | ICD-10-CM

## 2024-04-02 ENCOUNTER — Telehealth (HOSPITAL_COMMUNITY): Payer: Self-pay | Admitting: *Deleted

## 2024-04-02 NOTE — Telephone Encounter (Signed)
 Called to confirm/remind patient of their appointment at the [facility] on 04/03/24.       Appointment:              [x] Confirmed             [] Left mess              [] No answer/No voice mail             [] Phone not in service   Patient reminded to bring all medications and/or complete list.   Confirmed patient has transportation. Gave directions, instructed to utilize valet parking.

## 2024-04-03 ENCOUNTER — Encounter (HOSPITAL_COMMUNITY): Payer: Self-pay

## 2024-04-03 ENCOUNTER — Ambulatory Visit (HOSPITAL_COMMUNITY)
Admission: RE | Admit: 2024-04-03 | Discharge: 2024-04-03 | Disposition: A | Discharge status: Home / Self Care | Source: Ambulatory Visit | Attending: Family Medicine | Admitting: Family Medicine

## 2024-04-03 VITALS — BP 116/78 | HR 78 | Ht 63.0 in | Wt 257.4 lb

## 2024-04-03 DIAGNOSIS — E669 Obesity, unspecified: Secondary | ICD-10-CM | POA: Insufficient documentation

## 2024-04-03 DIAGNOSIS — J439 Emphysema, unspecified: Secondary | ICD-10-CM | POA: Insufficient documentation

## 2024-04-03 DIAGNOSIS — I4891 Unspecified atrial fibrillation: Secondary | ICD-10-CM

## 2024-04-03 DIAGNOSIS — I509 Heart failure, unspecified: Secondary | ICD-10-CM | POA: Diagnosis not present

## 2024-04-03 DIAGNOSIS — I272 Pulmonary hypertension, unspecified: Secondary | ICD-10-CM | POA: Diagnosis not present

## 2024-04-03 DIAGNOSIS — J449 Chronic obstructive pulmonary disease, unspecified: Secondary | ICD-10-CM | POA: Diagnosis not present

## 2024-04-03 DIAGNOSIS — M797 Fibromyalgia: Secondary | ICD-10-CM | POA: Insufficient documentation

## 2024-04-03 DIAGNOSIS — I50810 Right heart failure, unspecified: Secondary | ICD-10-CM

## 2024-04-03 DIAGNOSIS — Z7901 Long term (current) use of anticoagulants: Secondary | ICD-10-CM | POA: Insufficient documentation

## 2024-04-03 DIAGNOSIS — J849 Interstitial pulmonary disease, unspecified: Secondary | ICD-10-CM | POA: Diagnosis not present

## 2024-04-03 DIAGNOSIS — Z87891 Personal history of nicotine dependence: Secondary | ICD-10-CM | POA: Diagnosis not present

## 2024-04-03 DIAGNOSIS — E119 Type 2 diabetes mellitus without complications: Secondary | ICD-10-CM | POA: Diagnosis not present

## 2024-04-03 DIAGNOSIS — K7469 Other cirrhosis of liver: Secondary | ICD-10-CM | POA: Diagnosis not present

## 2024-04-03 DIAGNOSIS — I480 Paroxysmal atrial fibrillation: Secondary | ICD-10-CM | POA: Insufficient documentation

## 2024-04-03 DIAGNOSIS — E785 Hyperlipidemia, unspecified: Secondary | ICD-10-CM | POA: Insufficient documentation

## 2024-04-03 DIAGNOSIS — K7460 Unspecified cirrhosis of liver: Secondary | ICD-10-CM | POA: Diagnosis not present

## 2024-04-03 DIAGNOSIS — G894 Chronic pain syndrome: Secondary | ICD-10-CM | POA: Diagnosis not present

## 2024-04-03 DIAGNOSIS — I110 Hypertensive heart disease with heart failure: Secondary | ICD-10-CM | POA: Insufficient documentation

## 2024-04-03 DIAGNOSIS — J9611 Chronic respiratory failure with hypoxia: Secondary | ICD-10-CM | POA: Diagnosis not present

## 2024-04-03 DIAGNOSIS — Z794 Long term (current) use of insulin: Secondary | ICD-10-CM | POA: Diagnosis not present

## 2024-04-03 DIAGNOSIS — R0683 Snoring: Secondary | ICD-10-CM | POA: Insufficient documentation

## 2024-04-03 DIAGNOSIS — Z79899 Other long term (current) drug therapy: Secondary | ICD-10-CM | POA: Diagnosis not present

## 2024-04-03 DIAGNOSIS — D649 Anemia, unspecified: Secondary | ICD-10-CM | POA: Diagnosis not present

## 2024-04-03 LAB — BASIC METABOLIC PANEL WITH GFR
Anion gap: 12 (ref 5–15)
BUN: 31 mg/dL — ABNORMAL HIGH (ref 8–23)
CO2: 25 mmol/L (ref 22–32)
Calcium: 8.9 mg/dL (ref 8.9–10.3)
Chloride: 97 mmol/L — ABNORMAL LOW (ref 98–111)
Creatinine, Ser: 1.33 mg/dL — ABNORMAL HIGH (ref 0.44–1.00)
GFR, Estimated: 45 mL/min — ABNORMAL LOW (ref >60)
Glucose, Bld: 188 mg/dL — ABNORMAL HIGH (ref 70–99)
Potassium: 4 mmol/L (ref 3.5–5.1)
Sodium: 134 mmol/L — ABNORMAL LOW (ref 135–145)

## 2024-04-03 LAB — BRAIN NATRIURETIC PEPTIDE: B Natriuretic Peptide: 48.3 pg/mL (ref 0.0–100.0)

## 2024-04-03 MED ORDER — TORSEMIDE 20 MG PO TABS: 20.0000 mg | ORAL_TABLET | Freq: Every day | ORAL | Status: AC

## 2024-04-03 NOTE — Progress Notes (Signed)
 ITAMAR home sleep study given to patient, all instructions explained, waiver signed, and CLOUDPAT registration complete.  Height:     Weight: BMI:  Today's Date:  STOP BANG RISK ASSESSMENT S (snore) Have you been told that you snore?     YES   T (tired) Are you often tired, fatigued, or sleepy during the day?   YES  O (obstruction) Do you stop breathing, choke, or gasp during sleep? NO   P (pressure) Do you have or are you being treated for high blood pressure? YES   B (BMI) Is your body index greater than 35 kg/m? YES   A (age) Are you 65 years old or older? YES   N (neck) Do you have a neck circumference greater than 16 inches?   NO  G (gender) Are you a female? NO   TOTAL STOP/BANG "YES" ANSWERS                                                                        For Office Use Only              Procedure Order Form    YES to 3+ Stop Bang questions OR two clinical symptoms - patient qualifies for WatchPAT (CPT 95800)      Clinical Notes: Will consult Sleep Specialist and refer for management of therapy due to patient increased risk of Sleep Apnea. Ordering a sleep study due to the following two clinical symptoms: Excessive daytime sleepiness G47.10 / Gastroesophageal reflux K21.9 / Nocturia R35.1 / Morning Headaches G44.221 / Difficulty concentrating R41.840 / Memory problems or poor judgment G31.84 / Personality changes or irritability R45.4 / Loud snoring R06.83 / Depression F32.9 / Unrefreshed by sleep G47.8 / Impotence N52.9 / History of high blood pressure R03.0 / Insomnia G47.00

## 2024-04-03 NOTE — Patient Instructions (Signed)
 Medication Changes:  No Changes In Medications at this time.   Lab Work:  Labs done today, your results will be available in MyChart, we will contact you for abnormal readings.  Testing/Procedures:  Your provider has recommended that you have a home sleep study ([facility]amar Test).  We have provided you with the equipment in our office today. Please go ahead and download the app. DO NOT OPEN OR TAMPER WITH THE BOX UNTIL WE ADVISE YOU TO DO SO. Once insurance has approved the test our office will call you with PIN number and approval to proceed with testing. Once you have completed the test you just dispose of the equipment, the information is automatically uploaded to us  via blue-tooth technology. If your test is positive for sleep apnea and you need a home CPAP machine you will be contacted by Dr Dorine office [facility]) to set this up.  Follow-Up in: 6 months PLEASE CALL OUR OFFICE AROUND JANUARY 2026 TO GET SCHEDULED FOR YOUR APPOINTMENT. PHONE NUMBER IS [phone] OPTION 2   At the [facility], you and your health needs are our priority. We have a designated team specialized in the treatment of Heart Failure. This Care Team includes your primary Heart Failure Specialized Cardiologist (physician), Advanced Practice Providers (APPs- Physician Assistants and Nurse Practitioners), and Pharmacist who all work together to provide you with the care you need, when you need it.   You may see any of the following providers on your designated Care Team at your next follow up:  Dr. Toribio Fuel Dr. Ezra Shuck Dr. Ria Commander Dr. Odis Brownie Greig Mosses, NP [place]affie Shed, [place] [facility] [place], [place] Beckey Coe, NP [place], NP Tinnie Redman, PharmD   Please be sure to bring in all your medications bottles to every appointment.   Need to Contact Us :  If you have any questions or concerns before your next appointment please send us  a message  through [place] or call our office at [phone].    TO LEAVE A MESSAGE FOR THE NURSE SELECT OPTION 2, PLEASE LEAVE A MESSAGE INCLUDING: YOUR NAME DATE OF BIRTH CALL BACK NUMBER REASON FOR CALL**this is important as we prioritize the call backs  YOU WILL RECEIVE A CALL BACK THE SAME DAY AS LONG AS YOU CALL BEFORE 4:00 PM

## 2024-04-04 ENCOUNTER — Ambulatory Visit (HOSPITAL_COMMUNITY): Payer: Self-pay | Admitting: Family Medicine

## 2024-04-09 ENCOUNTER — Encounter (HOSPITAL_COMMUNITY): Payer: Self-pay | Admitting: Psychiatry

## 2024-04-09 ENCOUNTER — Telehealth (HOSPITAL_BASED_OUTPATIENT_CLINIC_OR_DEPARTMENT_OTHER): Admitting: Psychiatry

## 2024-04-09 DIAGNOSIS — F4310 Post-traumatic stress disorder, unspecified: Secondary | ICD-10-CM | POA: Diagnosis not present

## 2024-04-09 DIAGNOSIS — F411 Generalized anxiety disorder: Secondary | ICD-10-CM

## 2024-04-09 DIAGNOSIS — F331 Major depressive disorder, recurrent, moderate: Secondary | ICD-10-CM | POA: Diagnosis not present

## 2024-04-09 MED ORDER — ZIPRASIDONE HCL 20 MG PO CAPS
20.0000 mg | ORAL_CAPSULE | Freq: Every day | ORAL | 2 refills | Status: DC
Start: 2024-04-09 — End: 2024-07-09

## 2024-04-09 MED ORDER — CLONAZEPAM 0.25 MG PO TBDP
0.2500 mg | ORAL_TABLET | Freq: Two times a day (BID) | ORAL | 2 refills | Status: DC | PRN
Start: 2024-04-09 — End: 2024-07-09

## 2024-04-09 MED ORDER — DOXEPIN HCL 75 MG PO CAPS: 75.0000 mg | ORAL_CAPSULE | Freq: Every day | ORAL | 2 refills | Status: DC

## 2024-04-09 MED ORDER — FLUOXETINE HCL 40 MG PO CAPS: ORAL_CAPSULE | ORAL | 2 refills | Status: DC

## 2024-04-09 NOTE — Progress Notes (Signed)
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 anemia.  Discussed medication side effects and benefits.  Recommend to call back if she has any question or any concern.  Follow-up in 3 months.   Follow Up Instructions:     I discussed the assessment and treatment plan with the patient. The patient was provided an opportunity to ask questions and all were answered. The patient agreed with the plan and demonstrated an understanding of the instructions.   The patient was advised to call back or seek an in-person evaluation if the symptoms worsen or if the condition fails to improve as anticipated.    Collaboration of Care: Other provider involved in patient's care AEB notes are  available in epic to review  Patient/Guardian was advised Release of Information must be obtained prior to any record release in order to collaborate their care with an outside provider. Patient/Guardian was advised if they have not already done so to contact the registration department to sign all necessary forms in order for us  to release information regarding their care.   Consent: Patient/Guardian gives verbal consent for treatment and assignment of benefits for services provided during this visit. Patient/Guardian  expressed understanding and agreed to proceed.     Total encounter time 26 minutes which includes face-to-face time, chart reviewed, care coordination, order entry and documentation during this encounter.   Note: This document was prepared by [employer] voice dictation technology and any errors that results from this process are unintentional.    Leni ONEIDA Client, MD 04/09/2024

## 2024-04-11 DIAGNOSIS — R739 Hyperglycemia, unspecified: Secondary | ICD-10-CM | POA: Diagnosis not present

## 2024-04-11 DIAGNOSIS — N1832 Chronic kidney disease, stage 3b: Secondary | ICD-10-CM | POA: Diagnosis not present

## 2024-04-11 DIAGNOSIS — I130 Hypertensive heart and chronic kidney disease with heart failure and stage 1 through stage 4 chronic kidney disease, or unspecified chronic kidney disease: Secondary | ICD-10-CM | POA: Diagnosis not present

## 2024-04-11 DIAGNOSIS — R300 Dysuria: Secondary | ICD-10-CM | POA: Diagnosis not present

## 2024-04-11 DIAGNOSIS — E139 Other specified diabetes mellitus without complications: Secondary | ICD-10-CM | POA: Diagnosis not present

## 2024-04-11 DIAGNOSIS — Z794 Long term (current) use of insulin: Secondary | ICD-10-CM | POA: Diagnosis not present

## 2024-04-11 DIAGNOSIS — E785 Hyperlipidemia, unspecified: Secondary | ICD-10-CM | POA: Diagnosis not present

## 2024-04-18 NOTE — Progress Notes (Signed)
 Office Visit Note  Patient: [name]             Date of Birth: [birth year]           MRN: [id number]             PCP: Loreli Elsie JONETTA Mickey., MD Referring: Loreli Elsie JONETTA Mickey., MD Visit Date: 04/24/2024   Subjective:  Follow-up (Right side of the neck started hurting a couple months ago . )   Discussed the use of AI scribe software for clinical note transcription with the patient, who gave verbal consent to proceed.  History of Present Illness   LATIFAH PADIN is a 63 y.o. female here for follow up with polymyalgia rheumatica on prednisone  with gradual tapering prednisone  from 10 mg to 5 mg    She has been experiencing neck pain for a couple of months, particularly when turning her head to the right. The pain is described as a 'pull or a strain' and is exacerbated by sleeping on her left side, which causes a sensation of pressure.  She is currently taking prednisone  at a dose of 5 mg, which has been effective in managing her symptoms without significant side effects. No prolonged morning stiffness is noted, and the pain is primarily triggered by certain activities, such as using her arms.  She previously tried Flexeril  for hip pain, which was helpful, and the hip pain has not recurred. She denies any recent unusual activities that might have triggered the neck pain and states that this pain does not resemble any previous pain she has experienced.  No swelling or significant bruising is reported, although she notes some bruising on her arms and dry skin.       Previous HPI 01/23/2024 TAVIANA WESTERGREN is a 63 y.o. female here for follow up with polymyalgia rheumatica on prednisone  with gradual tapering prednisone  from 10 mg to 5 mg since last visit. She complaints of severe left hip pain today.   She experiences severe pain in her hip, described as 'ten plus' in intensity, which began suddenly upon waking. The pain is primarily located on the side and front of her hip,  described as 'crushing'. It worsens with standing and walking, but laying down provides some relief. The pain has progressively worsened since onset and does not radiate past the knee. It extends from the side of the hip to the back, reaching a specific point in the back. Increased tenderness is noted in certain areas when pressure is applied.   She has been tapering her prednisone  dose, currently at 2.5 mg, and reports increased stiffness since the dose reduction. However, the severe pain did not coincide directly with the dose change, as she has been on the lower dose for a couple of weeks. She has a history of polymyalgia rheumatica, which is managed with prednisone .   She has a history of using Flexeril , a muscle relaxer, without adverse effects. She also reports difficulty sleeping due to the pain and requires assistance from her mother for tasks such as removing her shirt due to limited shoulder mobility, although the shoulders are not painful.   No new symptoms in her shoulders, although she has difficulty with overhead movements. She confirms that the pain does not extend down the back of her leg.      Previous HPI 10/28/2023 BRITTAY MOGLE is a 63 year old female with polymyalgia rheumatica on prednisone  with gradual tapering now at 10 mg once daily. She presents  with increased stiffness in the neck and shoulders.   She has experienced increased stiffness in her neck and shoulders for the past couple of weeks, since approximately January. The stiffness has worsened following a decrease in her prednisone  dosage. It is present throughout the day and does not radiate to other areas. No numbness, tingling, or swelling is associated with the stiffness.   She has been on prednisone  for several weeks without difficulty. Her inflammation markers, including sedimentation rate and C-reactive protein, were previously elevated but have shown improvement.   She describes shoulder pain as manageable,  with the right side being worse than the left. Pain occurs with certain movements, causing pressure but no sharp pain. X-rays indicate narrowing of the acromioclavicular joint space, suggesting osteoarthritis and possible chronic rotator cuff inflammation.     Previous HPI 09/16/23 KERSTIN CRUSOE is a 63 y.o. female here for evaluation of bilateral joint pain and stiffness, predominantly affecting the shoulders and hands and high sedimentation rate. The symptoms began approximately four months ago, initially presenting as a sensation of a pulled muscle in the shoulder, progressing to the point where the patient was unable to lift their arm. Concurrently, the patient noticed increasing difficulty in making a fist, more so in one hand than the other. The symptoms were described as painful, with no visible swelling or color changes noted. The discomfort and limited mobility were consistent throughout the day, with no relief or worsening at any particular time. The patient denied any lower extremity involvement.   Despite initial advice to continue using the affected joints, the patient's symptoms persisted, leading to a loss of function in the arms and hands. The patient also reported dropping objects and difficulty opening pill bottles. Numbness in the hands was noted during this period, but has since resolved.   The patient was started on prednisone  20 mg following blood work that showed a high sedimentation rate. Improvement in symptoms was noted after approximately three days of steroid therapy, with continued gradual improvement since then. The patient has been on prednisone  consistently for about two months. The main side effect noted was worsening of diabetes control.   She has a history of fibromyalgia but does not feel current symptoms are typical of her chronic muscle or joint pains. The patient has no history of similar episodes, injuries, or surgeries in the neck or shoulders. The patient has  not had any imaging studies of the affected joints.    Labs reviewed 06/2023 ESR 65 CK 74   07/2022 RF 24.3 ANA neg   Review of Systems  Constitutional:  Positive for fatigue.  HENT:  Positive for mouth dryness. Negative for mouth sores.   Eyes:  Negative for dryness.  Respiratory:  Negative for shortness of breath.   Cardiovascular:  Positive for palpitations. Negative for chest pain.  Gastrointestinal:  Negative for blood in stool, constipation and diarrhea.  Endocrine: Negative for increased urination.  Genitourinary:  Negative for involuntary urination.  Musculoskeletal:  Positive for joint pain, gait problem, joint pain, myalgias, muscle tenderness and myalgias. Negative for joint swelling, muscle weakness and morning stiffness.  Skin:  Negative for color change, rash, hair loss and sensitivity to sunlight.  Allergic/Immunologic: Negative for susceptible to infections.  Neurological:  Positive for dizziness. Negative for headaches.  Hematological:  Negative for swollen glands.  Psychiatric/Behavioral:  Negative for depressed mood and sleep disturbance. The patient is nervous/anxious.     PMFS History:  Patient Active Problem List   Diagnosis Date  Noted   Neck pain on right side 04/24/2024   Snoring 04/03/2024   Syncope and collapse 03/16/2024   PMR (polymyalgia rheumatica) (HCC) 01/23/2024   Lateral pain of left hip 01/23/2024   Polyarthritis 09/16/2023   Bilateral shoulder pain 09/16/2023   Paroxysmal atrial fibrillation (HCC) 07/27/2022   CHF (congestive heart failure), NYHA class III, acute, systolic (HCC) 07/23/2022   Acute on chronic diastolic CHF (congestive heart failure) (HCC) 07/23/2022   Type 2 diabetes mellitus with hyperlipidemia (HCC) 01/09/2019   Essential hypertension 12/12/2018   Hyperlipidemia 12/12/2018   Preoperative clearance 12/12/2018   Endometrial cancer (HCC) 11/15/2018   Incisional hernia, without obstruction or gangrene 02/04/2014    Post-op pain 07/23/2013   Postoperative wound infection 05/25/2013   Wound disruption, post-op, skin 01/30/2013   Preoperative respiratory examination 12/18/2012   Smoking 12/18/2012   Heme positive stool 11/25/2012   Hypokalemia 11/25/2012   COPD (chronic obstructive pulmonary disease) (HCC)    Acid reflux disease    Post-operative state 11/21/2012   AKI (acute kidney injury) (HCC) 11/04/2012   HAP (hospital-acquired pneumonia) 11/03/2012   Acute pulmonary edema (HCC) 11/03/2012   Acute respiratory failure with hypoxia (HCC) 11/02/2012   Acute exacerbation of COPD with asthma (HCC) 11/02/2012   Parastomal hernia 11/02/2012   Fibromyalgia 11/02/2012   Generalized anxiety disorder 11/02/2012   Restless legs syndrome 11/02/2012   Class 2 obesity 11/02/2012   Leukocytosis 11/02/2012   Hyperglycemia 11/02/2012   Cigarette nicotine  dependence with withdrawal 11/02/2012   Complete atelectasis 11/02/2012    Past Medical History:  Diagnosis Date   Acid reflux disease    Anemia 03/27/2024   Anxiety    Asthma    Bronchitis, chronic (HCC)    Effects worse with URI   Chronic pain syndrome    COPD (chronic obstructive pulmonary disease) (HCC)    Depression    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, type 2   Diverticulitis    Dysplasia of cervix (uteri)    patient states she had dysplasia of the uterus stage 4   Endometrial cancer (HCC)    Fibromyalgia    Fibromyalgia    GERD (gastroesophageal reflux disease)    Headache(784.0)    migraine headache   Hyperlipidemia    Hypertension    Kidney stones    at least 6 stones in past   Pneumonia 2019   Restless leg syndrome    Bilateral   Vomiting    last night at 2300 after last round of antibiotic    Family History  Problem Relation Age of Onset   Diabetes Mother    Throat cancer Mother    Uterine cancer Sister    Deep vein thrombosis Neg Hx    Pulmonary embolism Neg Hx    Past Surgical History:   Procedure Laterality Date   ABDOMINAL HYSTERECTOMY     APPENDECTOMY     APPLICATION OF WOUND VAC N/A 11/01/2012   Procedure: APPLICATION OF WOUND VAC;  Surgeon: Debby LABOR. Cornett, MD;  Location: [facility];  Service: General;  Laterality: N/A;   CHOLECYSTECTOMY     COLONOSCOPY  2013   COLOSTOMY     COLOSTOMY CLOSURE N/A 11/01/2012   Procedure: COLOSTOMY CLOSURE;  Surgeon: Debby LABOR. Cornett, MD;  Location: [facility];  Service: General;  Laterality: N/A;  REPAIR PARASTOMAL HERNIA; REVISION OF COLOSTOMY   COLOSTOMY CLOSURE N/A 05/03/2013   Procedure: COLOSTOMY CLOSURE;  Surgeon: Debby LABOR. Cornett, MD;  Location: [facility];  Service: General;  Laterality: N/A;   DILATION AND CURETTAGE OF UTERUS N/A 05/24/2019   Procedure: DILATATION AND CURETTAGE;  Surgeon: Shannon Agent, MD;  Location: [facility];  Service: Gynecology;  Laterality: N/A;   ESOPHAGOGASTRODUODENOSCOPY Left 11/27/2012   Procedure: ESOPHAGOGASTRODUODENOSCOPY (EGD);  Surgeon: Agent LITTIE Celestia Mickey., MD;  Location: [facility];  Service: Gastroenterology;  Laterality: Left;   INCISIONAL HERNIA REPAIR N/A 11/01/2012   Procedure: HERNIA REPAIR INCISIONAL;  Surgeon: Debby LABOR. Cornett, MD;  Location: [facility];  Service: General;  Laterality: N/A;   LAPAROTOMY  11/01/2012   Procedure: EXPLORATORY LAPAROTOMY;  Surgeon: Debby LABOR. Cornett, MD;  Location: [facility];  Service: General;;  exploratory laparotomy,repair of parastomal hernia and incisional hernia, lysis of adhesions, and application of wound V.A.C.   LYSIS OF ADHESION N/A 11/01/2012   Procedure: LYSIS OF ADHESION;  Surgeon: Debby LABOR. Cornett, MD;  Location: [facility];  Service: General;  Laterality: N/A;   OPERATIVE ULTRASOUND N/A 06/14/2019   Procedure: OPERATIVE ULTRASOUND;  Surgeon: Shannon Agent, MD;  Location: [facility];  Service: Urology;  Laterality: N/A;   PARASTOMAL HERNIA REPAIR N/A 11/01/2012   Procedure: HERNIA REPAIR PARASTOMAL;  Surgeon: Debby LABOR. Cornett, MD;  Location: [facility];  Service: General;   Laterality: N/A;   PARTIAL HYSTERECTOMY     partial   RIGHT HEART CATH N/A 07/26/2022   Procedure: RIGHT HEART CATH;  Surgeon: Rolan Ezra RAMAN, MD;  [facility];  Service: Cardiovascular;  Laterality: N/A;   TANDEM RING INSERTION N/A 05/24/2019   Procedure: INSERTION OF HYMEN CAPSULE;  Surgeon: Shannon Agent, MD;  Location: [facility];  Service: Gynecology;  Laterality: N/A;   TANDEM RING INSERTION N/A 06/14/2019   Procedure: HYMEN CAPSULE PLACEMENT;  Surgeon: Shannon Agent, MD;  Location: [facility];  Service: Urology;  Laterality: N/A;   Social History   Social History Narrative   Not on file   Immunization History  Administered Date(s) Administered   Influenza,inj,Quad PF,6+ Mos 06/08/2017   Moderna Sars-Covid-2 Vaccination 05/16/2020   Pneumococcal Polysaccharide-23 07/22/2011   Tdap 07/01/2015     Objective: Vital Signs: BP 118/72 (BP Location: Left Arm, Patient Position: Sitting, Cuff Size: Normal)   Pulse 82   Resp 18   Ht 5' 4 (1.626 m)   Wt 237 lb (107.5 kg)   LMP 04/20/2013   BMI 40.68 kg/m    Physical Exam Eyes:     Conjunctiva/sclera: Conjunctivae normal.  Cardiovascular:     Rate and Rhythm: Normal rate and regular rhythm.  Pulmonary:     Effort: Pulmonary effort is normal.     Breath sounds: Normal breath sounds.  Skin:    General: Skin is warm and dry.  Neurological:     Mental Status: She is alert.  Psychiatric:        Mood and Affect: Mood normal.      Musculoskeletal Exam:  Right posterior neck tenderness, pain with rightward rotation Shoulders hard to raise overhead bilaterally Elbows full ROM no tenderness or swelling Wrists full ROM no tenderness or swelling Fingers full ROM no tenderness or swelling No paraspinal tenderness to palpation over upper and lower back Left lateral hip tenderness to pressure some lateral pain provoked with FABER/FADIR Knees full ROM no tenderness or swelling    Investigation: No additional  findings.  Imaging: No results found.  Recent Labs: Lab Results  Component Value Date   WBC 8.2 03/17/2024   HGB 8.4 (L) 03/17/2024   PLT 279 03/17/2024  NA 134 (L) 04/03/2024   K 4.0 04/03/2024   CL 97 (L) 04/03/2024   CO2 25 04/03/2024   GLUCOSE 188 (H) 04/03/2024   BUN 31 (H) 04/03/2024   CREATININE 1.33 (H) 04/03/2024   BILITOT 0.4 03/17/2024   ALKPHOS 54 03/17/2024   AST 14 (L) 03/17/2024   ALT 5 03/17/2024   PROT 6.1 (L) 03/17/2024   ALBUMIN  3.1 (L) 03/17/2024   CALCIUM  8.9 04/03/2024   GFRAA >60 11/22/2019    Speciality Comments: No specialty comments available.  Procedures:  No procedures performed Allergies: Ultram  [tramadol ] and Ozempic (0.25 or 0.5 mg-dose) [semaglutide(0.25 or 0.5mg -dos)]   Assessment / Plan:     Visit Diagnoses: Polyarthritis PMR (polymyalgia rheumatica) (HCC) Right posterior neck muscle pain Chronic pain likely involving posterior scalene muscles, exacerbated by head movement and certain sleeping positions. No significant impairment or swelling. Forward neck position may contribute to strain. - Provided exercises for lateral neck probably scalene muscles. - Considered physical therapy referral if exercises ineffective. - Continue prednisone  5 mg daily, monitor for flare-ups or side effects.     Lateral pain of left hip  Pain significantly improved no longer requiring as needed Flexeril  for this.     Orders: No orders of the defined types were placed in this encounter.  Meds ordered this encounter  Medications   predniSONE  (DELTASONE ) 5 MG tablet    Sig: Take 1 tablet (5 mg total) by mouth daily with breakfast.    Dispense:  90 tablet    Refill:  1     Follow-Up Instructions: Return in about 6 months (around 10/25/2024) for PMR on GC f/u 6mos.   Lonni LELON Ester, MD  Note - This record has been created using [employer].  Chart creation errors have been sought, but may not always  have been located. Such creation  errors do not reflect on  the standard of medical care.

## 2024-04-23 ENCOUNTER — Other Ambulatory Visit: Payer: Self-pay | Admitting: Internal Medicine

## 2024-04-23 DIAGNOSIS — M353 Polymyalgia rheumatica: Secondary | ICD-10-CM

## 2024-04-23 NOTE — Telephone Encounter (Signed)
 Last Fill: 02/28/2024   Next Visit: 04/24/2024   Last Visit: 01/23/2024   Dx: PMR (polymyalgia rheumatica) (HCC)    Current Dose per office note on 01/23/2024: If severely elevated would resume higher steroid dose or resume tapering.    Okay to refill Prednisone ?

## 2024-04-24 ENCOUNTER — Encounter: Payer: Self-pay | Admitting: Internal Medicine

## 2024-04-24 ENCOUNTER — Ambulatory Visit: Attending: Internal Medicine | Admitting: Internal Medicine

## 2024-04-24 VITALS — BP 118/72 | HR 82 | Resp 18 | Ht 64.0 in | Wt 237.0 lb

## 2024-04-24 DIAGNOSIS — M25552 Pain in left hip: Secondary | ICD-10-CM

## 2024-04-24 DIAGNOSIS — Z7952 Long term (current) use of systemic steroids: Secondary | ICD-10-CM

## 2024-04-24 DIAGNOSIS — M353 Polymyalgia rheumatica: Secondary | ICD-10-CM

## 2024-04-24 DIAGNOSIS — M542 Cervicalgia: Secondary | ICD-10-CM | POA: Diagnosis not present

## 2024-04-24 DIAGNOSIS — M130 Polyarthritis, unspecified: Secondary | ICD-10-CM

## 2024-04-24 DIAGNOSIS — M797 Fibromyalgia: Secondary | ICD-10-CM

## 2024-04-24 MED ORDER — PREDNISONE 5 MG PO TABS: 5.0000 mg | ORAL_TABLET | Freq: Every day | ORAL | 1 refills | Status: DC

## 2024-04-24 NOTE — Patient Instructions (Addendum)
 Stretching exercises Rotation neck stretching  Sit in a chair or stand up. Place your feet flat on the floor, shoulder-width apart. Slowly turn your head (rotate) to the right until a slight stretch is felt. Turn it all the way to the right so you can look over your right shoulder. Do not tilt or tip your head. Hold this position for 10-30 seconds. Slowly turn your head (rotate) to the left until a slight stretch is felt. Turn it all the way to the left so you can look over your left shoulder. Do not tilt or tip your head. Hold this position for 10-30 seconds. Repeat __________ times. Complete this exercise __________ times a day. Neck retraction  Sit in a sturdy chair or stand up. Look straight ahead. Do not bend your neck. Use your fingers to push your chin backward (retraction). Do not bend your neck for this movement. Continue to face straight ahead. If you are doing the exercise properly, you will feel a slight sensation in your throat and a stretch at the back of your neck. Hold the stretch for 1-2 seconds. Repeat __________ times. Complete this exercise __________ times a day. Strengthening exercises Neck press  Lie on your back on a firm bed or on the floor with a pillow under your head. Use your neck muscles to push your head down on the pillow and straighten your spine. Hold the position as well as you can. Keep your head facing up (in a neutral position) and your chin tucked. Slowly count to 5 while holding this position. Repeat __________ times. Complete this exercise __________ times a day. Isometrics  These are exercises in which you strengthen the muscles in your neck while keeping your neck still (isometrics). Sit in a supportive chair and place your hand on your forehead. Keep your head and face facing straight ahead. Do not flex or extend your neck while doing isometrics. Push forward with your head and neck while pushing back with your hand. Hold for 10 seconds. Do  the sequence again, this time putting your hand against the back of your head. Use your head and neck to push backward against the hand pressure. Finally, do the same exercise on either side of your head, pushing sideways against the pressure of your hand. Repeat __________ times. Complete this exercise __________ times a day. Prone head lifts  Lie face-down (prone position), resting on your elbows so that your chest and upper back are raised. Start with your head facing downward, near your chest. Position your chin either on or near your chest. Slowly lift your head upward. Lift until you are looking straight ahead. Then continue lifting your head as far back as you can comfortably stretch. Hold your head up for 5 seconds. Then slowly lower it to your starting position. Repeat __________ times. Complete this exercise __________ times a day. Scapular retraction  Stand with your arms at your sides. Look straight ahead. Slowly pull both shoulders (scapulae) backward and downward (retraction) until you feel a stretch between your shoulder blades in your upper back. Hold for 10-30 seconds. Relax and repeat. Repeat __________ times. Complete this exercise __________ times a day.

## 2024-04-27 NOTE — Telephone Encounter (Signed)
 Called to confirm/remind patient of their appointment at the [facility] on 04/03/24.       Appointment:              [x] Confirmed             [] Left mess              [] No answer/No voice mail             [] Phone not in service   Patient reminded to bring all medications and/or complete list.   Confirmed patient has transportation. Gave directions, instructed to utilize valet parking.

## 2024-05-02 ENCOUNTER — Other Ambulatory Visit (HOSPITAL_COMMUNITY): Payer: Self-pay | Admitting: Psychiatry

## 2024-05-02 DIAGNOSIS — F4310 Post-traumatic stress disorder, unspecified: Secondary | ICD-10-CM

## 2024-06-06 DIAGNOSIS — Z23 Encounter for immunization: Secondary | ICD-10-CM | POA: Diagnosis not present

## 2024-06-06 DIAGNOSIS — N1832 Chronic kidney disease, stage 3b: Secondary | ICD-10-CM | POA: Diagnosis not present

## 2024-06-06 DIAGNOSIS — E785 Hyperlipidemia, unspecified: Secondary | ICD-10-CM | POA: Diagnosis not present

## 2024-06-06 DIAGNOSIS — Z794 Long term (current) use of insulin: Secondary | ICD-10-CM | POA: Diagnosis not present

## 2024-06-06 DIAGNOSIS — E139 Other specified diabetes mellitus without complications: Secondary | ICD-10-CM | POA: Diagnosis not present

## 2024-06-06 DIAGNOSIS — I130 Hypertensive heart and chronic kidney disease with heart failure and stage 1 through stage 4 chronic kidney disease, or unspecified chronic kidney disease: Secondary | ICD-10-CM | POA: Diagnosis not present

## 2024-06-13 ENCOUNTER — Encounter: Payer: Self-pay | Admitting: Internal Medicine

## 2024-06-13 ENCOUNTER — Ambulatory Visit: Attending: Internal Medicine | Admitting: Internal Medicine

## 2024-06-13 VITALS — BP 101/53 | HR 74 | Temp 97.3°F | Resp 18 | Ht 64.0 in | Wt 243.2 lb

## 2024-06-13 DIAGNOSIS — G8929 Other chronic pain: Secondary | ICD-10-CM | POA: Diagnosis not present

## 2024-06-13 DIAGNOSIS — M25512 Pain in left shoulder: Secondary | ICD-10-CM

## 2024-06-13 DIAGNOSIS — M25511 Pain in right shoulder: Secondary | ICD-10-CM | POA: Diagnosis not present

## 2024-06-13 MED ORDER — TRIAMCINOLONE ACETONIDE 40 MG/ML IJ SUSP
40.0000 mg | INTRAMUSCULAR | Status: AC | PRN
  Administered 2024-06-13: 40 mg via INTRA_ARTICULAR

## 2024-06-13 MED ORDER — LIDOCAINE HCL 1 % IJ SOLN
2.0000 mL | INTRAMUSCULAR | Status: AC | PRN
  Administered 2024-06-13: 2 mL

## 2024-06-13 NOTE — Progress Notes (Signed)
 Office Visit Note  Patient: [name]             Date of Birth: [birth year]           MRN: [id number]             PCP: Loreli Elsie JONETTA Mickey., MD Referring: Loreli Elsie JONETTA Mickey., MD Visit Date: 06/13/2024   Subjective:  Discussed the use of AI scribe software for clinical note transcription with the patient, who gave verbal consent to proceed.  History of Present Illness   [name] is a 63 year old female who presents with persistent neck and shoulder pain after recent visit last month for her PMR doing well with prednisone  tapering to 5 mg.  She has been experiencing constant, sharp neck pain for a couple of weeks, which she describes as straining and sharp, and the pain radiates down her arm and into her shoulder. The pain radiates down her arm and into her shoulder, described as straining.  The pain is tender to touch, especially towards the front of her neck and down her shoulder, and has become intolerable, prompting her to seek further evaluation. There has been no significant improvement with self-directed exercises.      Previous HPI 04/24/24 [name] is a 63 y.o. female here for follow up with polymyalgia rheumatica on prednisone  with gradual tapering prednisone  from 10 mg to 5 mg     She has been experiencing neck pain for a couple of months, particularly when turning her head to the right. The pain is described as a 'pull or a strain' and is exacerbated by sleeping on her left side, which causes a sensation of pressure.   She is currently taking prednisone  at a dose of 5 mg, which has been effective in managing her symptoms without significant side effects. No prolonged morning stiffness is noted, and the pain is primarily triggered by certain activities, such as using her arms.   She previously tried Flexeril  for hip pain, which was helpful, and the hip pain has not recurred. She denies any recent unusual activities that might have triggered the neck  pain and states that this pain does not resemble any previous pain she has experienced.   No swelling or significant bruising is reported, although she notes some bruising on her arms and dry skin.         Previous HPI 01/23/2024 [name] is a 63 y.o. female here for follow up with polymyalgia rheumatica on prednisone  with gradual tapering prednisone  from 10 mg to 5 mg since last visit. She complaints of severe left hip pain today.   She experiences severe pain in her hip, described as 'ten plus' in intensity, which began suddenly upon waking. The pain is primarily located on the side and front of her hip, described as 'crushing'. It worsens with standing and walking, but laying down provides some relief. The pain has progressively worsened since onset and does not radiate past the knee. It extends from the side of the hip to the back, reaching a specific point in the back. Increased tenderness is noted in certain areas when pressure is applied.   She has been tapering her prednisone  dose, currently at 2.5 mg, and reports increased stiffness since the dose reduction. However, the severe pain did not coincide directly with the dose change, as she has been on the lower dose for a couple of weeks. She has a history of polymyalgia rheumatica, which is  managed with prednisone .   She has a history of using Flexeril , a muscle relaxer, without adverse effects. She also reports difficulty sleeping due to the pain and requires assistance from her mother for tasks such as removing her shirt due to limited shoulder mobility, although the shoulders are not painful.   No new symptoms in her shoulders, although she has difficulty with overhead movements. She confirms that the pain does not extend down the back of her leg.      Previous HPI 10/28/2023 [name] is a 63 year old female with polymyalgia rheumatica on prednisone  with gradual tapering now at 10 mg once daily. She presents with  increased stiffness in the neck and shoulders.   She has experienced increased stiffness in her neck and shoulders for the past couple of weeks, since approximately January. The stiffness has worsened following a decrease in her prednisone  dosage. It is present throughout the day and does not radiate to other areas. No numbness, tingling, or swelling is associated with the stiffness.   She has been on prednisone  for several weeks without difficulty. Her inflammation markers, including sedimentation rate and C-reactive protein, were previously elevated but have shown improvement.   She describes shoulder pain as manageable, with the right side being worse than the left. Pain occurs with certain movements, causing pressure but no sharp pain. X-rays indicate narrowing of the acromioclavicular joint space, suggesting osteoarthritis and possible chronic rotator cuff inflammation.     Previous HPI 09/16/23 [name] is a 63 y.o. female here for evaluation of bilateral joint pain and stiffness, predominantly affecting the shoulders and hands and high sedimentation rate. The symptoms began approximately four months ago, initially presenting as a sensation of a pulled muscle in the shoulder, progressing to the point where the patient was unable to lift their arm. Concurrently, the patient noticed increasing difficulty in making a fist, more so in one hand than the other. The symptoms were described as painful, with no visible swelling or color changes noted. The discomfort and limited mobility were consistent throughout the day, with no relief or worsening at any particular time. The patient denied any lower extremity involvement.   Despite initial advice to continue using the affected joints, the patient's symptoms persisted, leading to a loss of function in the arms and hands. The patient also reported dropping objects and difficulty opening pill bottles. Numbness in the hands was noted during this  period, but has since resolved.   The patient was started on prednisone  20 mg following blood work that showed a high sedimentation rate. Improvement in symptoms was noted after approximately three days of steroid therapy, with continued gradual improvement since then. The patient has been on prednisone  consistently for about two months. The main side effect noted was worsening of diabetes control.   She has a history of fibromyalgia but does not feel current symptoms are typical of her chronic muscle or joint pains. The patient has no history of similar episodes, injuries, or surgeries in the neck or shoulders. The patient has not had any imaging studies of the affected joints.    Labs reviewed 06/2023 ESR 65 CK 74   07/2022 RF 24.3 ANA neg   Review of Systems  Constitutional:  Positive for fatigue.  HENT:  Negative for mouth sores and mouth dryness.   Eyes:  Negative for dryness.  Respiratory:  Positive for shortness of breath.   Cardiovascular:  Negative for chest pain and palpitations.  Gastrointestinal:  Positive  for blood in stool. Negative for constipation and diarrhea.  Endocrine: Negative for increased urination.  Genitourinary:  Negative for involuntary urination.  Musculoskeletal:  Positive for joint pain, joint pain, muscle weakness, morning stiffness and muscle tenderness. Negative for gait problem, joint swelling, myalgias and myalgias.  Skin:  Negative for color change, rash, hair loss and sensitivity to sunlight.  Allergic/Immunologic: Negative for susceptible to infections.  Neurological:  Positive for dizziness. Negative for headaches.  Hematological:  Negative for swollen glands.  Psychiatric/Behavioral:  Positive for depressed mood and sleep disturbance. The patient is nervous/anxious.     PMFS History:  Patient Active Problem List   Diagnosis Date Noted   Neck pain on right side 04/24/2024   Snoring 04/03/2024   Syncope and collapse 03/16/2024   PMR  (polymyalgia rheumatica) 01/23/2024   Lateral pain of left hip 01/23/2024   Polyarthritis 09/16/2023   Bilateral shoulder pain 09/16/2023   Paroxysmal atrial fibrillation (HCC) 07/27/2022   CHF (congestive heart failure), NYHA class III, acute, systolic (HCC) 07/23/2022   Acute on chronic diastolic CHF (congestive heart failure) (HCC) 07/23/2022   Type 2 diabetes mellitus with hyperlipidemia (HCC) 01/09/2019   Essential hypertension 12/12/2018   Hyperlipidemia 12/12/2018   Preoperative clearance 12/12/2018   Endometrial cancer (HCC) 11/15/2018   Incisional hernia, without obstruction or gangrene 02/04/2014   Post-op pain 07/23/2013   Postoperative wound infection 05/25/2013   Wound disruption, post-op, skin 01/30/2013   Preoperative respiratory examination 12/18/2012   Smoking 12/18/2012   Heme positive stool 11/25/2012   Hypokalemia 11/25/2012   COPD (chronic obstructive pulmonary disease) (HCC)    Acid reflux disease    Post-operative state 11/21/2012   AKI (acute kidney injury) 11/04/2012   HAP (hospital-acquired pneumonia) 11/03/2012   Acute pulmonary edema (HCC) 11/03/2012   Acute respiratory failure with hypoxia (HCC) 11/02/2012   Acute exacerbation of COPD with asthma (HCC) 11/02/2012   Parastomal hernia 11/02/2012   Fibromyalgia 11/02/2012   Generalized anxiety disorder 11/02/2012   Restless legs syndrome 11/02/2012   Class 2 obesity 11/02/2012   Leukocytosis 11/02/2012   Hyperglycemia 11/02/2012   Cigarette nicotine  dependence with withdrawal 11/02/2012   Complete atelectasis 11/02/2012    Past Medical History:  Diagnosis Date   Acid reflux disease    Anemia 03/27/2024   Anxiety    Asthma    Bronchitis, chronic (HCC)    Effects worse with URI   Chronic pain syndrome    COPD (chronic obstructive pulmonary disease) (HCC)    Depression    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, type 2   Diverticulitis    Dysplasia of cervix (uteri)     patient states she had dysplasia of the uterus stage 4   Endometrial cancer (HCC)    Fibromyalgia    Fibromyalgia    GERD (gastroesophageal reflux disease)    Headache(784.0)    migraine headache   Hyperlipidemia    Hypertension    Kidney stones    at least 6 stones in past   Pneumonia 2019   Restless leg syndrome    Bilateral   Vomiting    last night at 2300 after last round of antibiotic    Family History  Problem Relation Age of Onset   Diabetes Mother    Throat cancer Mother    Uterine cancer Sister    Deep vein thrombosis Neg Hx    Pulmonary embolism Neg Hx    Past Surgical History:  Procedure Laterality Date  ABDOMINAL HYSTERECTOMY     APPENDECTOMY     APPLICATION OF WOUND VAC N/A 11/01/2012   Procedure: APPLICATION OF WOUND VAC;  Surgeon: Debby LABOR. Cornett, MD;  Location: [facility];  Service: General;  Laterality: N/A;   CHOLECYSTECTOMY     COLONOSCOPY  2013   COLOSTOMY     COLOSTOMY CLOSURE N/A 11/01/2012   Procedure: COLOSTOMY CLOSURE;  Surgeon: Debby LABOR. Cornett, MD;  Location: [facility];  Service: General;  Laterality: N/A;  REPAIR PARASTOMAL HERNIA; REVISION OF COLOSTOMY   COLOSTOMY CLOSURE N/A 05/03/2013   Procedure: COLOSTOMY CLOSURE;  Surgeon: Debby LABOR. Cornett, MD;  Location: [facility];  Service: General;  Laterality: N/A;   DILATION AND CURETTAGE OF UTERUS N/A 05/24/2019   Procedure: DILATATION AND CURETTAGE;  Surgeon: Shannon Agent, MD;  Location: [facility];  Service: Gynecology;  Laterality: N/A;   ESOPHAGOGASTRODUODENOSCOPY Left 11/27/2012   Procedure: ESOPHAGOGASTRODUODENOSCOPY (EGD);  Surgeon: Agent LITTIE Celestia Mickey., MD;  Location: [facility];  Service: Gastroenterology;  Laterality: Left;   INCISIONAL HERNIA REPAIR N/A 11/01/2012   Procedure: HERNIA REPAIR INCISIONAL;  Surgeon: Debby LABOR. Cornett, MD;  Location: [facility];  Service: General;  Laterality: N/A;   LAPAROTOMY  11/01/2012   Procedure: EXPLORATORY LAPAROTOMY;  Surgeon: Debby LABOR. Cornett, MD;  Location: [facility];   Service: General;;  exploratory laparotomy,repair of parastomal hernia and incisional hernia, lysis of adhesions, and application of wound V.A.C.   LYSIS OF ADHESION N/A 11/01/2012   Procedure: LYSIS OF ADHESION;  Surgeon: Debby LABOR. Cornett, MD;  Location: [facility];  Service: General;  Laterality: N/A;   OPERATIVE ULTRASOUND N/A 06/14/2019   Procedure: OPERATIVE ULTRASOUND;  Surgeon: Shannon Agent, MD;  Location: [facility];  Service: Urology;  Laterality: N/A;   PARASTOMAL HERNIA REPAIR N/A 11/01/2012   Procedure: HERNIA REPAIR PARASTOMAL;  Surgeon: Debby LABOR. Cornett, MD;  Location: [facility];  Service: General;  Laterality: N/A;   PARTIAL HYSTERECTOMY     partial   RIGHT HEART CATH N/A 07/26/2022   Procedure: RIGHT HEART CATH;  Surgeon: Rolan Ezra RAMAN, MD;  Location: [facility];  Service: Cardiovascular;  Laterality: N/A;   TANDEM RING INSERTION N/A 05/24/2019   Procedure: INSERTION OF HYMEN CAPSULE;  Surgeon: Shannon Agent, MD;  Location: [facility];  Service: Gynecology;  Laterality: N/A;   TANDEM RING INSERTION N/A 06/14/2019   Procedure: HYMEN CAPSULE PLACEMENT;  Surgeon: Shannon Agent, MD;  Location: [facility];  Service: Urology;  Laterality: N/A;   Social History   Social History Narrative   Not on file   Immunization History  Administered Date(s) Administered   Influenza,inj,Quad PF,6+ Mos 06/08/2017   Moderna Sars-Covid-2 Vaccination 05/16/2020   Pneumococcal Polysaccharide-23 07/22/2011   Tdap 07/01/2015     Objective: Vital Signs: BP (!) 101/53   Pulse 74   Temp (!) 97.3 F (36.3 C)   Resp 18   Ht 5' 4 (1.626 m)   Wt 243 lb 3.2 oz (110.3 kg)   LMP 04/20/2013   BMI 41.75 kg/m    Physical Exam Eyes:     Conjunctiva/sclera: Conjunctivae normal.  Cardiovascular:     Rate and Rhythm: Normal rate and regular rhythm.  Pulmonary:     Effort: Pulmonary effort is normal.     Breath sounds: Normal breath sounds.  Skin:    General: Skin is warm and dry.  Neurological:      Mental Status: She is alert.  Psychiatric:        Mood and Affect:  Mood normal.      Musculoskeletal Exam:  Right shoulder painful to lateral palpation, pain with overhead abduction Tenderness extending to trapezius and lateral neck muscles Pain radiation into arm almost to elbow level  Investigation: No additional findings.  Imaging: No results found.  Recent Labs: Lab Results  Component Value Date   WBC 8.2 03/17/2024   HGB 8.4 (L) 03/17/2024   PLT 279 03/17/2024   NA 134 (L) 04/03/2024   K 4.0 04/03/2024   CL 97 (L) 04/03/2024   CO2 25 04/03/2024   GLUCOSE 188 (H) 04/03/2024   BUN 31 (H) 04/03/2024   CREATININE 1.33 (H) 04/03/2024   BILITOT 0.4 03/17/2024   ALKPHOS 54 03/17/2024   AST 14 (L) 03/17/2024   ALT 5 03/17/2024   PROT 6.1 (L) 03/17/2024   ALBUMIN  3.1 (L) 03/17/2024   CALCIUM  8.9 04/03/2024   GFRAA >60 11/22/2019    Speciality Comments: No specialty comments available.  Procedures:  Large Joint Inj: R subacromial bursa on 06/13/2024 4:20 PM Indications: pain Details: 27 G 1.5 in needle, lateral approach Medications: 2 mL lidocaine  1 %; 40 mg triamcinolone  acetonide 40 MG/ML Outcome: tolerated well, no immediate complications Procedure, treatment alternatives, risks and benefits explained, specific risks discussed. Consent was given by the patient. Immediately prior to procedure a time out was called to verify the correct patient, procedure, equipment, support staff and site/side marked as required. Patient was prepped and draped in the usual sterile fashion.     Allergies: Ultram  [tramadol ] and Ozempic (0.25 or 0.5 mg-dose) [semaglutide(0.25 or 0.5mg -dos)]   Assessment / Plan:     Visit Diagnoses: Chronic pain of both shoulders Suspected rotator cuff disorder, likely involving the supraspinatus muscle, causing persistent sharp pain. Treatment for neck muscle strain ineffective so far. Recommendation for trial of subacromial bursa injection for acute  pain relief, can refer to PT or more advanced imaging if symptoms return within a short time. - Right shoulder subacromial bursa injection - Consider physical therapy referral if symptoms recur or for long-term management.  Cervicalgia Chronic neck pain with tenderness, exacerbated by movement. Previous imaging reviewed, no new imaging planned.        Orders: Orders Placed This Encounter  Procedures   Large Joint Inj   No orders of the defined types were placed in this encounter.    Follow-Up Instructions: No follow-ups on file.   Lonni LELON Ester, MD  Note - This record has been created using [employer].  Chart creation errors have been sought, but may not always  have been located. Such creation errors do not reflect on  the standard of medical care.

## 2024-07-01 ENCOUNTER — Other Ambulatory Visit (HOSPITAL_COMMUNITY): Payer: Self-pay | Admitting: Psychiatry

## 2024-07-01 DIAGNOSIS — F411 Generalized anxiety disorder: Secondary | ICD-10-CM

## 2024-07-01 DIAGNOSIS — F4310 Post-traumatic stress disorder, unspecified: Secondary | ICD-10-CM

## 2024-07-01 DIAGNOSIS — F331 Major depressive disorder, recurrent, moderate: Secondary | ICD-10-CM

## 2024-07-09 ENCOUNTER — Encounter (HOSPITAL_COMMUNITY): Payer: Self-pay | Admitting: Psychiatry

## 2024-07-09 ENCOUNTER — Telehealth (HOSPITAL_COMMUNITY): Admitting: Psychiatry

## 2024-07-09 VITALS — Wt 243.0 lb

## 2024-07-09 DIAGNOSIS — F4310 Post-traumatic stress disorder, unspecified: Secondary | ICD-10-CM | POA: Diagnosis not present

## 2024-07-09 DIAGNOSIS — F411 Generalized anxiety disorder: Secondary | ICD-10-CM

## 2024-07-09 DIAGNOSIS — F331 Major depressive disorder, recurrent, moderate: Secondary | ICD-10-CM | POA: Diagnosis not present

## 2024-07-09 MED ORDER — ZIPRASIDONE HCL 20 MG PO CAPS: 20.0000 mg | ORAL_CAPSULE | Freq: Every day | ORAL | 2 refills | Status: DC

## 2024-07-09 MED ORDER — CLONAZEPAM 0.25 MG PO TBDP: 0.2500 mg | ORAL_TABLET | Freq: Two times a day (BID) | ORAL | 2 refills | Status: DC | PRN

## 2024-07-09 MED ORDER — FLUOXETINE HCL 40 MG PO CAPS: ORAL_CAPSULE | ORAL | 2 refills | Status: DC

## 2024-07-09 MED ORDER — DOXEPIN HCL 75 MG PO CAPS: 75.0000 mg | ORAL_CAPSULE | Freq: Every day | ORAL | 2 refills | Status: DC
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
## 2024-07-26 DIAGNOSIS — D649 Anemia, unspecified: Secondary | ICD-10-CM | POA: Diagnosis not present

## 2024-07-26 DIAGNOSIS — N1832 Chronic kidney disease, stage 3b: Secondary | ICD-10-CM | POA: Diagnosis not present

## 2024-07-26 DIAGNOSIS — K219 Gastro-esophageal reflux disease without esophagitis: Secondary | ICD-10-CM | POA: Diagnosis not present

## 2024-07-26 DIAGNOSIS — I5032 Chronic diastolic (congestive) heart failure: Secondary | ICD-10-CM | POA: Diagnosis not present

## 2024-07-26 DIAGNOSIS — I130 Hypertensive heart and chronic kidney disease with heart failure and stage 1 through stage 4 chronic kidney disease, or unspecified chronic kidney disease: Secondary | ICD-10-CM | POA: Diagnosis not present

## 2024-07-26 DIAGNOSIS — E7849 Other hyperlipidemia: Secondary | ICD-10-CM | POA: Diagnosis not present

## 2024-07-26 DIAGNOSIS — E785 Hyperlipidemia, unspecified: Secondary | ICD-10-CM | POA: Diagnosis not present

## 2024-07-30 DIAGNOSIS — I480 Paroxysmal atrial fibrillation: Secondary | ICD-10-CM | POA: Diagnosis not present

## 2024-07-30 DIAGNOSIS — M353 Polymyalgia rheumatica: Secondary | ICD-10-CM | POA: Diagnosis not present

## 2024-07-30 DIAGNOSIS — Z0000 Encounter for general adult medical examination without abnormal findings: Secondary | ICD-10-CM | POA: Diagnosis not present

## 2024-07-30 DIAGNOSIS — E041 Nontoxic single thyroid nodule: Secondary | ICD-10-CM | POA: Diagnosis not present

## 2024-07-30 DIAGNOSIS — E139 Other specified diabetes mellitus without complications: Secondary | ICD-10-CM | POA: Diagnosis not present

## 2024-07-30 DIAGNOSIS — F3341 Major depressive disorder, recurrent, in partial remission: Secondary | ICD-10-CM | POA: Diagnosis not present

## 2024-07-30 DIAGNOSIS — I10 Essential (primary) hypertension: Secondary | ICD-10-CM | POA: Diagnosis not present

## 2024-07-30 DIAGNOSIS — D509 Iron deficiency anemia, unspecified: Secondary | ICD-10-CM | POA: Diagnosis not present

## 2024-07-30 DIAGNOSIS — Z9049 Acquired absence of other specified parts of digestive tract: Secondary | ICD-10-CM | POA: Diagnosis not present

## 2024-07-30 DIAGNOSIS — Z1331 Encounter for screening for depression: Secondary | ICD-10-CM | POA: Diagnosis not present

## 2024-07-30 DIAGNOSIS — J849 Interstitial pulmonary disease, unspecified: Secondary | ICD-10-CM | POA: Diagnosis not present

## 2024-07-30 DIAGNOSIS — I272 Pulmonary hypertension, unspecified: Secondary | ICD-10-CM | POA: Diagnosis not present

## 2024-07-30 DIAGNOSIS — Z1339 Encounter for screening examination for other mental health and behavioral disorders: Secondary | ICD-10-CM | POA: Diagnosis not present

## 2024-07-30 DIAGNOSIS — J449 Chronic obstructive pulmonary disease, unspecified: Secondary | ICD-10-CM | POA: Diagnosis not present

## 2024-07-31 ENCOUNTER — Other Ambulatory Visit: Payer: Self-pay | Admitting: Internal Medicine

## 2024-07-31 DIAGNOSIS — Z1231 Encounter for screening mammogram for malignant neoplasm of breast: Secondary | ICD-10-CM

## 2024-07-31 DIAGNOSIS — E041 Nontoxic single thyroid nodule: Secondary | ICD-10-CM

## 2024-08-01 ENCOUNTER — Other Ambulatory Visit (HOSPITAL_COMMUNITY): Payer: Self-pay | Admitting: Psychiatry

## 2024-08-01 DIAGNOSIS — F331 Major depressive disorder, recurrent, moderate: Secondary | ICD-10-CM

## 2024-08-01 DIAGNOSIS — D509 Iron deficiency anemia, unspecified: Secondary | ICD-10-CM | POA: Diagnosis not present

## 2024-08-01 DIAGNOSIS — F411 Generalized anxiety disorder: Secondary | ICD-10-CM

## 2024-08-01 DIAGNOSIS — Z8679 Personal history of other diseases of the circulatory system: Secondary | ICD-10-CM | POA: Diagnosis not present

## 2024-08-01 DIAGNOSIS — I4891 Unspecified atrial fibrillation: Secondary | ICD-10-CM | POA: Diagnosis not present

## 2024-08-01 DIAGNOSIS — F4310 Post-traumatic stress disorder, unspecified: Secondary | ICD-10-CM

## 2024-08-01 DIAGNOSIS — Z7901 Long term (current) use of anticoagulants: Secondary | ICD-10-CM | POA: Diagnosis not present

## 2024-08-02 ENCOUNTER — Telehealth (HOSPITAL_BASED_OUTPATIENT_CLINIC_OR_DEPARTMENT_OTHER): Payer: Self-pay

## 2024-08-02 NOTE — Telephone Encounter (Signed)
   Pre-operative Risk Assessment    Patient Name: [name]  DOB: [birth year] MRN: [id number]   Date of last office visit: 09/06/23 with Rolan  Date of next office visit: NA   Request for Surgical Clearance    Procedure:  Colonoscopy and Endoscopy   Date of Surgery:  Clearance 09/06/23                                 Surgeon:  Dr. Burnette Socks Group or Practice Name:  [facility]  Phone number:  [phone] Fax number:  [phone]   Type of Clearance Requested:   - Medical  - Pharmacy:  Hold Apixaban  (Eliquis ) 2 days prior    Type of Anesthesia:  propofol    Additional requests/questions:    Bonney Augustin JONETTA Delores   08/02/2024, 8:09 AM

## 2024-08-07 ENCOUNTER — Ambulatory Visit
Admission: RE | Admit: 2024-08-07 | Discharge: 2024-08-07 | Disposition: A | Source: Ambulatory Visit | Attending: Internal Medicine | Admitting: Internal Medicine

## 2024-08-07 DIAGNOSIS — E041 Nontoxic single thyroid nodule: Secondary | ICD-10-CM

## 2024-08-14 DIAGNOSIS — R82998 Other abnormal findings in urine: Secondary | ICD-10-CM | POA: Diagnosis not present

## 2024-08-14 NOTE — Telephone Encounter (Signed)
 Patient with diagnosis of Afib on Eliquis  for anticoagulation.    Procedure: Colonoscopy and Endoscopy  Date of procedure: 09/06/2023   CHA2DS2-VASc Score = 5   This indicates a 7.2% annual risk of stroke. The patient's score is based upon: CHF History: 1 HTN History: 1 Diabetes History: 1 Stroke History: 0 Vascular Disease History: 0 Age Score: 1 Gender Score: 1     CrCl 73 mL/min  Platelet count 279 K  Patient has not  had an Afib/aflutter ablation in the last 3 months, DCCV within the last 4 weeks or a watchman implanted in the last 45 days     Per office protocol, patient can hold Eliquis  for 2 days prior to procedure.   Patient will not need bridging with Lovenox  (enoxaparin ) around procedure.  **This guidance is not considered finalized until pre-operative APP has relayed final recommendations.**

## 2024-08-14 NOTE — Telephone Encounter (Signed)
   Name: [name]  DOB: [birth year]  MRN: [id number]  Primary Cardiologist: None   Preoperative team, please contact this patient and set up a phone call appointment for further preoperative risk assessment. Please obtain consent and complete medication review. Thank you for your help.  I confirm that guidance regarding antiplatelet and oral anticoagulation therapy has been completed and, if necessary, noted below.   Per office protocol, patient can hold Eliquis  for 2 days prior to procedure.   Patient will not need bridging with Lovenox  (enoxaparin ) around procedure.  I also confirmed the patient resides in the state of [place] . As per [facility] Medical Board telemedicine laws, the patient must reside in the state in which the provider is licensed.   Damien JAYSON Braver, NP 08/14/2024, 2:06 PM [facility]

## 2024-08-24 ENCOUNTER — Ambulatory Visit
Admission: RE | Admit: 2024-08-24 | Discharge: 2024-08-24 | Disposition: A | Source: Ambulatory Visit | Attending: Internal Medicine | Admitting: Internal Medicine

## 2024-08-24 DIAGNOSIS — Z1231 Encounter for screening mammogram for malignant neoplasm of breast: Secondary | ICD-10-CM

## 2024-09-03 NOTE — Telephone Encounter (Signed)
 [facility] called to inquire about clearance and eliquis  hold waiting 1 month    PreOp team started Current request in holding-pt needs preOp call/visit  Will route to HR providers for input/review

## 2024-09-04 NOTE — Telephone Encounter (Signed)
 Routed via epic  And faxed to number provided

## 2024-09-05 ENCOUNTER — Ambulatory Visit: Admitting: Podiatry

## 2024-09-05 ENCOUNTER — Encounter: Payer: Self-pay | Admitting: Podiatry

## 2024-09-05 DIAGNOSIS — M79675 Pain in left toe(s): Secondary | ICD-10-CM | POA: Diagnosis not present

## 2024-09-05 DIAGNOSIS — E785 Hyperlipidemia, unspecified: Secondary | ICD-10-CM | POA: Diagnosis not present

## 2024-09-05 DIAGNOSIS — E1169 Type 2 diabetes mellitus with other specified complication: Secondary | ICD-10-CM

## 2024-09-05 DIAGNOSIS — B351 Tinea unguium: Secondary | ICD-10-CM | POA: Diagnosis not present

## 2024-09-05 DIAGNOSIS — M79674 Pain in right toe(s): Secondary | ICD-10-CM | POA: Diagnosis not present

## 2024-09-05 NOTE — Progress Notes (Signed)
°  Subjective:  Patient ID: [name], female    DOB: 1960/10/31,   MRN: [id number]  Chief Complaint  Patient presents with   Diabetes    Diabetic toenail clipping  Saw Dr. Elsie Gentry - 08/14/2024; A1c - 9.7    63 y.o. female presents for concern of thickened elongated and painful nails that are difficult to trim. Requesting to have them trimmed today. Denies burning and tingling in their feet. Patient is diabetic and last A1c was  Lab Results  Component Value Date   HGBA1C 9.7 (H) 03/16/2024   .   PCP:  Gentry Elsie JONETTA Mickey., MD    . Denies any other pedal complaints. Denies n/v/f/c.   Past Medical History:  Diagnosis Date   Acid reflux disease    Anemia 03/27/2024   Anxiety    Asthma    Bronchitis, chronic (HCC)    Effects worse with URI   Chronic pain syndrome    COPD (chronic obstructive pulmonary disease) (HCC)    Depression    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, type 2   Diverticulitis    Dysplasia of cervix (uteri)    patient states she had dysplasia of the uterus stage 4   Endometrial cancer (HCC)    Fibromyalgia    Fibromyalgia    GERD (gastroesophageal reflux disease)    Headache(784.0)    migraine headache   Hyperlipidemia    Hypertension    Kidney stones    at least 6 stones in past   Pneumonia 2019   Restless leg syndrome    Bilateral   Vomiting    last night at 2300 after last round of antibiotic    Objective:  Physical Exam: Vascular: DP/PT pulses 2/4 bilateral. CFT <3 seconds. Absent hair growth on digits. Edema noted to bilateral lower extremities. Xerosis noted bilaterally.  Skin. No lacerations or abrasions bilateral feet. Nails 1-5 bilateral  are thickened discolored and elongated with subungual debris.  Musculoskeletal: MMT 5/5 bilateral lower extremities in DF, PF, Inversion and Eversion. Deceased ROM in DF of ankle joint.  Neurological: Sensation intact to light touch. Protective sensation diminished  bilateral.     Assessment:   1. Pain due to onychomycosis of toenails of both feet   2. Type 2 diabetes mellitus with hyperlipidemia (HCC)      Plan:  Patient was evaluated and treated and all questions answered. -Discussed and educated patient on diabetic foot care, especially with  regards to the vascular, neurological and musculoskeletal systems.  -Stressed the importance of good glycemic control and the detriment of not  controlling glucose levels in relation to the foot. -Discussed supportive shoes at all times and checking feet regularly.  -Mechanically debrided all nails 1-5 bilateral using sterile nail nipper and filed with dremel without incident  -Answered all patient questions -Patient to return  in 3 months for at risk foot care -Patient advised to call the office if any problems or questions arise in the meantime.   Asberry Failing, DPM

## 2024-09-07 ENCOUNTER — Other Ambulatory Visit (HOSPITAL_COMMUNITY): Payer: Self-pay | Admitting: Psychiatry

## 2024-09-07 DIAGNOSIS — F4310 Post-traumatic stress disorder, unspecified: Secondary | ICD-10-CM

## 2024-09-07 DIAGNOSIS — F411 Generalized anxiety disorder: Secondary | ICD-10-CM

## 2024-09-24 ENCOUNTER — Ambulatory Visit (HOSPITAL_COMMUNITY): Admitting: Cardiology

## 2024-09-26 ENCOUNTER — Other Ambulatory Visit: Payer: Self-pay | Admitting: Gastroenterology

## 2024-09-26 DIAGNOSIS — D649 Anemia, unspecified: Secondary | ICD-10-CM

## 2024-09-26 DIAGNOSIS — K2930 Chronic superficial gastritis without bleeding: Secondary | ICD-10-CM

## 2024-10-01 ENCOUNTER — Other Ambulatory Visit (HOSPITAL_COMMUNITY): Payer: Self-pay | Admitting: Psychiatry

## 2024-10-01 DIAGNOSIS — F411 Generalized anxiety disorder: Secondary | ICD-10-CM

## 2024-10-01 DIAGNOSIS — F331 Major depressive disorder, recurrent, moderate: Secondary | ICD-10-CM

## 2024-10-01 DIAGNOSIS — F4310 Post-traumatic stress disorder, unspecified: Secondary | ICD-10-CM

## 2024-10-05 ENCOUNTER — Telehealth (HOSPITAL_COMMUNITY): Payer: Self-pay | Admitting: Cardiology

## 2024-10-05 NOTE — Telephone Encounter (Signed)
 Called to confirm/remind patient of [facility] on 10/05/[facility].   Appointment:   [x] Confirmed  [] Left mess   [] No answer/No voice mail  [] VM Full/unable to leave message  [] Phone not in service  Patient reminded to bring all medications and/or complete list.  Confirmed patient has transportation. Gave directions, instructed to utilize valet parking.

## 2024-10-07 ENCOUNTER — Other Ambulatory Visit: Payer: Self-pay | Admitting: Internal Medicine

## 2024-10-07 DIAGNOSIS — M353 Polymyalgia rheumatica: Secondary | ICD-10-CM

## 2024-10-08 ENCOUNTER — Encounter (HOSPITAL_COMMUNITY): Payer: Self-pay | Admitting: Psychiatry

## 2024-10-08 ENCOUNTER — Ambulatory Visit (HOSPITAL_COMMUNITY)
Admission: RE | Admit: 2024-10-08 | Discharge: 2024-10-08 | Disposition: A | Source: Ambulatory Visit | Attending: Cardiology | Admitting: Cardiology

## 2024-10-08 ENCOUNTER — Encounter (HOSPITAL_COMMUNITY): Payer: Self-pay | Admitting: Cardiology

## 2024-10-08 ENCOUNTER — Telehealth (HOSPITAL_COMMUNITY): Payer: Self-pay

## 2024-10-08 ENCOUNTER — Telehealth (HOSPITAL_COMMUNITY): Admitting: Psychiatry

## 2024-10-08 ENCOUNTER — Other Ambulatory Visit (HOSPITAL_COMMUNITY): Payer: Self-pay

## 2024-10-08 VITALS — BP 110/62 | HR 96 | Ht 65.0 in | Wt 259.0 lb

## 2024-10-08 VITALS — Wt 243.0 lb

## 2024-10-08 DIAGNOSIS — J849 Interstitial pulmonary disease, unspecified: Secondary | ICD-10-CM | POA: Insufficient documentation

## 2024-10-08 DIAGNOSIS — I272 Pulmonary hypertension, unspecified: Secondary | ICD-10-CM | POA: Insufficient documentation

## 2024-10-08 DIAGNOSIS — M353 Polymyalgia rheumatica: Secondary | ICD-10-CM | POA: Diagnosis not present

## 2024-10-08 DIAGNOSIS — E785 Hyperlipidemia, unspecified: Secondary | ICD-10-CM | POA: Insufficient documentation

## 2024-10-08 DIAGNOSIS — K7460 Unspecified cirrhosis of liver: Secondary | ICD-10-CM | POA: Insufficient documentation

## 2024-10-08 DIAGNOSIS — Z79899 Other long term (current) drug therapy: Secondary | ICD-10-CM | POA: Insufficient documentation

## 2024-10-08 DIAGNOSIS — F411 Generalized anxiety disorder: Secondary | ICD-10-CM

## 2024-10-08 DIAGNOSIS — Z794 Long term (current) use of insulin: Secondary | ICD-10-CM | POA: Diagnosis not present

## 2024-10-08 DIAGNOSIS — Z6841 Body Mass Index (BMI) 40.0 and over, adult: Secondary | ICD-10-CM | POA: Insufficient documentation

## 2024-10-08 DIAGNOSIS — Z87891 Personal history of nicotine dependence: Secondary | ICD-10-CM | POA: Diagnosis not present

## 2024-10-08 DIAGNOSIS — M797 Fibromyalgia: Secondary | ICD-10-CM | POA: Diagnosis not present

## 2024-10-08 DIAGNOSIS — I2781 Cor pulmonale (chronic): Secondary | ICD-10-CM | POA: Diagnosis present

## 2024-10-08 DIAGNOSIS — I5021 Acute systolic (congestive) heart failure: Secondary | ICD-10-CM

## 2024-10-08 DIAGNOSIS — G4733 Obstructive sleep apnea (adult) (pediatric): Secondary | ICD-10-CM | POA: Diagnosis not present

## 2024-10-08 DIAGNOSIS — E861 Hypovolemia: Secondary | ICD-10-CM | POA: Insufficient documentation

## 2024-10-08 DIAGNOSIS — I480 Paroxysmal atrial fibrillation: Secondary | ICD-10-CM | POA: Insufficient documentation

## 2024-10-08 DIAGNOSIS — G894 Chronic pain syndrome: Secondary | ICD-10-CM | POA: Insufficient documentation

## 2024-10-08 DIAGNOSIS — I471 Supraventricular tachycardia, unspecified: Secondary | ICD-10-CM | POA: Insufficient documentation

## 2024-10-08 DIAGNOSIS — I491 Atrial premature depolarization: Secondary | ICD-10-CM | POA: Diagnosis not present

## 2024-10-08 DIAGNOSIS — Z7901 Long term (current) use of anticoagulants: Secondary | ICD-10-CM | POA: Diagnosis not present

## 2024-10-08 DIAGNOSIS — J439 Emphysema, unspecified: Secondary | ICD-10-CM | POA: Insufficient documentation

## 2024-10-08 DIAGNOSIS — E119 Type 2 diabetes mellitus without complications: Secondary | ICD-10-CM | POA: Diagnosis not present

## 2024-10-08 DIAGNOSIS — E669 Obesity, unspecified: Secondary | ICD-10-CM | POA: Insufficient documentation

## 2024-10-08 DIAGNOSIS — Z7952 Long term (current) use of systemic steroids: Secondary | ICD-10-CM | POA: Insufficient documentation

## 2024-10-08 DIAGNOSIS — F331 Major depressive disorder, recurrent, moderate: Secondary | ICD-10-CM

## 2024-10-08 DIAGNOSIS — F4310 Post-traumatic stress disorder, unspecified: Secondary | ICD-10-CM

## 2024-10-08 DIAGNOSIS — Z7985 Long-term (current) use of injectable non-insulin antidiabetic drugs: Secondary | ICD-10-CM | POA: Insufficient documentation

## 2024-10-08 DIAGNOSIS — I110 Hypertensive heart disease with heart failure: Secondary | ICD-10-CM | POA: Diagnosis not present

## 2024-10-08 LAB — BASIC METABOLIC PANEL WITH GFR
Anion gap: 15 (ref 5–15)
BUN: 38 mg/dL — ABNORMAL HIGH (ref 8–23)
CO2: 20 mmol/L — ABNORMAL LOW (ref 22–32)
Calcium: 9.4 mg/dL (ref 8.9–10.3)
Chloride: 99 mmol/L (ref 98–111)
Creatinine, Ser: 1.52 mg/dL — ABNORMAL HIGH (ref 0.44–1.00)
GFR, Estimated: 38 mL/min — ABNORMAL LOW
Glucose, Bld: 192 mg/dL — ABNORMAL HIGH (ref 70–99)
Potassium: 5.4 mmol/L — ABNORMAL HIGH (ref 3.5–5.1)
Sodium: 135 mmol/L (ref 135–145)

## 2024-10-08 LAB — LIPID PANEL
Cholesterol: 167 mg/dL (ref 0–200)
HDL: 44 mg/dL
LDL Cholesterol: 45 mg/dL (ref 0–99)
Total CHOL/HDL Ratio: 3.8 ratio
Triglycerides: 387 mg/dL — ABNORMAL HIGH
VLDL: 77 mg/dL — ABNORMAL HIGH (ref 0–40)

## 2024-10-08 LAB — PRO BRAIN NATRIURETIC PEPTIDE: Pro Brain Natriuretic Peptide: 168 pg/mL

## 2024-10-08 MED ORDER — FLUOXETINE HCL 40 MG PO CAPS: ORAL_CAPSULE | ORAL | 2 refills | Status: AC

## 2024-10-08 MED ORDER — CLONAZEPAM 0.25 MG PO TBDP: 0.2500 mg | ORAL_TABLET | Freq: Two times a day (BID) | ORAL | 2 refills | Status: AC | PRN

## 2024-10-08 MED ORDER — DOXEPIN HCL 75 MG PO CAPS: 75.0000 mg | ORAL_CAPSULE | Freq: Every day | ORAL | 2 refills | Status: AC

## 2024-10-08 MED ORDER — ZIPRASIDONE HCL 20 MG PO CAPS: 20.0000 mg | ORAL_CAPSULE | Freq: Every day | ORAL | 2 refills | Status: AC

## 2024-10-08 NOTE — Telephone Encounter (Signed)
 Last Fill: 04/24/2024  Next Visit: 10/25/2024  Last Visit: 06/13/2025  Dx: Chronic pain of both shoulders   Current Dose per office note on 06/13/2024: not disucssed  Okay to refill Prednisone ?

## 2024-10-08 NOTE — Telephone Encounter (Signed)
 No pre cert required for sleep study
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
## 2024-10-08 NOTE — Patient Instructions (Signed)
 There has been no changes to your medications.  Labs done today, your results will be available in MyChart, we will contact you for abnormal readings.  Your physician has requested that you have an echocardiogram. Echocardiography is a painless test that uses sound waves to create images of your heart. It provides your doctor with information about the size and shape of your heart and how well your hearts chambers and valves are working. This procedure takes approximately one hour. There are no restrictions for this procedure. Please do NOT wear cologne, perfume, aftershave, or lotions (deodorant is allowed). Please arrive 15 minutes prior to your appointment time.  Please note: We ask at that you not bring children with you during ultrasound (echo/ vascular) testing. Due to room size and safety concerns, children are not allowed in the ultrasound rooms during exams. Our front office staff cannot provide observation of children in our lobby area while testing is being conducted. An adult accompanying a patient to their appointment will only be allowed in the ultrasound room at the discretion of the ultrasound technician under special circumstances. We apologize for any inconvenience.  You are scheduled for a Cardiac Catheterization on Wednesday, February 4 with Dr. Ezra Shuck.  1. Please arrive at the [facility] (Main Entrance A) at [facility]: [address] [place], [place] [address] at 6:30 AM (This time is 2 hour(s) before your procedure to ensure your preparation).   Free valet parking service is available. You will check in at ADMITTING. The support person will be asked to wait in the waiting room.  It is OK to have someone drop you off and come back when you are ready to be discharged.    Special note: Every effort is made to have your procedure done on time. Please understand that emergencies sometimes delay scheduled procedures.  2. Diet: Nothing to eat after  midnight.   3. Medication instructions in preparation for your procedure:   Contrast Allergy: No  HOLD YOUR TORSEMIDE  AND ELIQUIS  THE MORNING OF YOUR PROCEDURE.  On the morning of your procedure, take any morning medicines NOT listed above.  You may use sips of water .  6. Plan to go home the same day, you will only stay overnight if medically necessary. 7. Bring a current list of your medications and current insurance cards. 8. You MUST have a responsible person to drive you home. 9. Someone MUST be with you the first 24 hours after you arrive home or your discharge will be delayed. 10. Please wear clothes that are easy to get on and off and wear slip-on shoes.  Your physician recommends that you schedule a follow-up appointment in: 6 WEEKS.  If you have any questions or concerns before your next appointment please send us  a message through [place] or call our office at [phone].    TO LEAVE A MESSAGE FOR THE NURSE SELECT OPTION 2, PLEASE LEAVE A MESSAGE INCLUDING: YOUR NAME DATE OF BIRTH CALL BACK NUMBER REASON FOR CALL**this is important as we prioritize the call backs  YOU WILL RECEIVE A CALL BACK THE SAME DAY AS LONG AS YOU CALL BEFORE 4:00 PM  At the [facility], you and your health needs are our priority. As part of our continuing mission to provide you with exceptional heart care, we have created designated Provider Care Teams. These Care Teams include your primary Cardiologist (physician) and Advanced Practice Providers (APPs- Physician Assistants and Nurse Practitioners) who all work together to provide  you with the care you need, when you need it.   You may see any of the following providers on your designated Care Team at your next follow up: Dr Toribio Fuel Dr Ezra Shuck Dr. Morene Brownie Greig Mosses, NP Caffie Shed, [place] [facility], [place] Beckey Coe, NP Jordan Lee, NP Ellouise Class, NP Tinnie Redman, PharmD Jaun Bash, PharmD   Please be sure to bring in all your medications bottles to every appointment.    Thank you for choosing [facility]

## 2024-10-09 ENCOUNTER — Ambulatory Visit (HOSPITAL_COMMUNITY): Payer: Self-pay | Admitting: Cardiology

## 2024-10-09 NOTE — Progress Notes (Signed)
 "  [facility]   Primary Care: Loreli Elsie JONETTA Mickey., MD Pulmonary: Dr. Alaine  HF Cardiologist: Dr. Rolan  Chief complaint: shortness of breath  HPI: 64 y.o. female smoker w/ h/o COPD, HTN, HLD, Type 2 DM, chronic pain syndrome/ fibromyalgia and pulmonary hypertension with RV failure/cor pulmonale.  She was admitted 11/23 with respiratory distress, treated with BiPAP. SCr elevated and unable to get CT chest. Echo showed EF 60-65%, severe RV failure and possible McConnell's sign, RA mod dilated, no MR. Felt respiratory distress related to CHF/RV failure. She was diuresed with IV lasix . RUQ US  limited due to patient habitus but suggestive of possible liver disease/cirrhosis (coarsened hepatic echotexture). She underwent RHC showing low PCWP and elevated right heart pressures, suggesting cor pulmonale. V/Q scan negative for PE. She had a significant oxygen  requirement and lung parenchymal disease felt to be significant. Continued to treat with steroids and oxygen .  GDMT initiated but limited by need for low-dose midodrine . She was discharged home on oxygen , weight 189 lbs.  Zio monitor in 1/24 showed multiple SVT episodes, possible atrial tachycardia, no atrial fibrillation.  Bisoprolol  was started.   CT chest in 2/24 showed possible smoking-related ILD (respiratory bronchiolitis).    Echo 12/24 EF 60-65%, normal RV, IVC normal, unable to estimate PA systolic pressure.   She quit smoking, 11/23.   Of note, no longer on Tyvaso . She stopped Tyvaso  during a severe URI.  When she tried to start it back, she developed severe headaches and worsening flu-like symptoms, so she stayed off it.  She does not feel any worse off Tyvaso .  In fact, she has improved a lot since quitting cigarettes.    Admitted 6/25 with syncope. MRI brain normal, orthostatics negative. Echo showed EF 70-75%, G1DD, normal RV. Syncope felt to be related to hypovolemia. Torsemide  held and given IVF and  discharged home.  Today she returns for HF follow up. She reports 2 months of increased shortness of breath.  She describes dyspnea with most activities around the house like showering and dressing.  She has to take frequent rest breaks.  She reports feeling exhausted all the time.  No orthopnea/PND.  No chest pain.  Occasional episodes of lightheadedness if she stands fast or bends over. Weight up 2 lbs.  She remains on torsemide  20 mg daily.  She is not smoking.  She is not using CPAP, has a home sleep study but has not used yet. She is on prednisone  5 mg daily for polymyalgia rheumatica.   ECG (personally reviewed): NSR, normal.   Labs (3/24): K 4.0, creatinine 1.46 Labs (6/24): K 4.3, creatinine 1.13 Labs (9/24): K 4, creatinine 1.14 Labs (6/25): K 4.0, creatinine 1.09 Labs (7/25): BNP 48, K 4, creatinine 1.33  6 minute walk (3/24): 244 m 6 minute walk (9/24): 305 m  PMH: 1. COPD: Prior smoking.  2. 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   - CT chest (2/24) with suspected respiratory bronchiolitis-related ILD.  3. OSA: Cannot tolerate CPAP.  4. Atrial fibrillation: Paroxysmal.  5. Atrial tachycardia: Zio monitor in 1/24 showed multiple SVT episodes, possible atrial tachycardia, no atrial fibrillation.  6. RV failiure/cor pulmonale:  - Echo (11/23): EF 60-65%, grade I DD,  RV severely reduced systolic function.  - RHC (11/23): RA mean 11, PA 44/19 mean 28, PCWP mean 4, CO/CI (Fick) 5.46/2.65, PVR 4.4, PAPi 2.3  - V/Q scan (11/23): No evidence for chronic PE.  - Echo (12/24): EF 60-65%, normal RV, IVC normal,  unable to estimate PA systolic pressure.  - Echo 6/25: EF 70-75%, G1DD, normal RV. Unable to estimate PA systolic pressure 7. Cirrhosis: Possibly due to RV failure.  8. 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 HTN 10. Hyperlipidemia 11. Fibromyalgia 12. H/o endometrial cancer 13. H/o nephrolithiasis 14. Polymyalgia rheumatica 15. Fe deficiency  anemia.   Current Outpatient Medications  Medication Sig Dispense Refill   acetaminophen  (TYLENOL ) 500 MG tablet Take 1,500 mg by mouth 2 (two) times daily as needed (pain).     apixaban  (ELIQUIS ) 5 MG TABS tablet Take 1 tablet (5 mg total) by mouth 2 (two) times daily. 60 tablet 0   atorvastatin  (LIPITOR) 40 MG tablet Take 40 mg by mouth at bedtime.      bisoprolol  (ZEBETA ) 5 MG tablet TAKE 1 TABLET (5 MG TOTAL) BY MOUTH DAILY. 90 tablet 3   Blood Glucose Monitoring Suppl (ONETOUCH VERIO FLEX SYSTEM) w/Device KIT To use daily to three times daily to check blood sugars for e11.69, one kit for 90 days     cefadroxil  (DURICEF) 500 MG capsule Take 1 capsule (500 mg total) by mouth 2 (two) times daily. 8 capsule 0   clonazePAM  (KLONOPIN ) 0.25 MG disintegrating tablet Take 1 tablet (0.25 mg total) by mouth 2 (two) times daily as needed (anxiety). 60 tablet 2   Continuous Glucose Sensor (FREESTYLE LIBRE 3 PLUS SENSOR) MISC change every 15 days to monitor blood glucose continuously; Duration: 30 days     Cyanocobalamin  (VITAMIN B12) 1000 MCG TABS 1 tablet Orally Once a day     cyclobenzaprine  (FLEXERIL ) 10 MG tablet Take 1 tablet (10 mg total) by mouth at bedtime as needed. 30 tablet 0   dicyclomine  (BENTYL ) 10 MG capsule Take 10 mg by mouth 4 (four) times daily -  before meals and at bedtime.     doxepin  (SINEQUAN ) 75 MG capsule Take 1 capsule (75 mg total) by mouth at bedtime. 30 capsule 2   FLUoxetine  (PROZAC ) 40 MG capsule TAKE 1 CAPSULE BY MOUTH EVERY DAY 30 capsule 2   insulin  degludec (TRESIBA FLEXTOUCH) 200 UNIT/ML FlexTouch Pen Inject 80 Units into the skin daily.     iron polysaccharides (NIFEREX) 150 MG capsule Take 150 mg by mouth daily.     Lancets (ONETOUCH DELICA PLUS LANCET33G) MISC as directed to SMBG three times daily; Duration: 90 days     NOVOLOG  FLEXPEN 100 UNIT/ML FlexPen Inject 10 Units into the skin 3 (three) times daily with meals.     omeprazole (PRILOSEC) 40 MG capsule Take  40 mg by mouth daily before breakfast.   6   ONETOUCH VERIO test strip To use daily to three times daily to check blood sugars for e11.69 for 30 days     predniSONE  (DELTASONE ) 5 MG tablet TAKE 1 TABLET BY MOUTH EVERY DAY WITH BREAKFAST 90 tablet 1   spironolactone  (ALDACTONE ) 25 MG tablet Take 0.5 tablets (12.5 mg total) by mouth daily. 30 tablet 0   tirzepatide (MOUNJARO) 2.5 MG/0.5ML Pen Inject 2.5 mg into the skin once a week.     torsemide  (DEMADEX ) 20 MG tablet Take 1 tablet (20 mg total) by mouth daily.     ziprasidone  (GEODON ) 20 MG capsule Take 1 capsule (20 mg total) by mouth daily after breakfast. 30 capsule 2   No current facility-administered medications for this encounter.   Allergies  Allergen Reactions   Ultram [Tramadol] Other (See Comments)    Per Dr. Dicky is renal failure  Ozempic (0.25 Or 0.5 Mg-Dose) [Semaglutide(0.25 Or 0.5mg -Dos)] Nausea And Vomiting and Other (See Comments)    Stomach pain No appetite    Social History   Socioeconomic History   Marital status: Married    Spouse name: Not on file   Number of children: Not on file   Years of education: Not on file   Highest education level: Not on file  Occupational History   Not on file  Tobacco Use   Smoking status: Former    Current packs/day: 0.00    Average packs/day: 0.5 packs/day    Types: Cigarettes    Quit date: 2010    Years since quitting: 16.0    Passive exposure: Past   Smokeless tobacco: Never   Tobacco comments:    about one pack a day on a bad day  Vaping Use   Vaping status: Former  Substance and Sexual Activity   Alcohol  use: No   Drug use: No   Sexual activity: Never    Birth control/protection: None  Other Topics Concern   Not on file  Social History Narrative   Not on file   Social Drivers of Health   Tobacco Use: Medium Risk (10/08/2024)   Patient History    Smoking Tobacco Use: Former    Smokeless Tobacco Use: Never    Passive Exposure: Past   Physicist, Medical Strain: Not on file  Food Insecurity: No Food Insecurity (03/17/2024)   Epic    Worried About Programme Researcher, Broadcasting/film/video in the Last Year: Never true    Ran Out of Food in the Last Year: Never true  Transportation Needs: No Transportation Needs (03/17/2024)   Epic    Lack of Transportation (Medical): No    Lack of Transportation (Non-Medical): No  Physical Activity: Not on file  Stress: Not on file  Social Connections: Not on file  Intimate Partner Violence: Not At Risk (03/17/2024)   Epic    Fear of Current or Ex-Partner: No    Emotionally Abused: No    Physically Abused: No    Sexually Abused: No  Depression (PHQ2-9): Medium Risk (01/09/2024)   Depression (PHQ2-9)    PHQ-2 Score: 9  Alcohol  Screen: Not on file  Housing: Low Risk (03/17/2024)   Epic    Unable to Pay for Housing in the Last Year: No    Number of Times Moved in the Last Year: 0    Homeless in the Last Year: No  Utilities: Not At Risk (03/17/2024)   Epic    Threatened with loss of utilities: No  Health Literacy: Not on file   Family History  Problem Relation Age of Onset   Diabetes Mother    Throat cancer Mother    Uterine cancer Sister    Deep vein thrombosis Neg Hx    Pulmonary embolism Neg Hx    ROS: All systems reviewed and negative except as per HPI.  Wt Readings from Last 3 Encounters:  10/08/24 117.5 kg (259 lb)  06/13/24 110.3 kg (243 lb 3.2 oz)  04/24/24 107.5 kg (237 lb)   BP 110/62   Pulse 96   Ht 5' 5 (1.651 m)   Wt 117.5 kg (259 lb)   LMP 04/20/2013   SpO2 96%   BMI 43.10 kg/m   PHYSICAL EXAM: General: NAD, obese Neck: Thick, JVP difficult, no thyromegaly or thyroid  nodule.  Lungs: Clear to auscultation bilaterally with normal respiratory effort. CV: Nondisplaced PMI.  Heart regular S1/S2, no S3/S4, no murmur.  No peripheral edema.  No carotid bruit.  Normal pedal pulses.  Abdomen: Soft, nontender, no hepatosplenomegaly, no distention.  Skin: Intact without lesions or  rashes.  Neurologic: Alert and oriented x 3.  Psych: Normal affect. Extremities: No clubbing or cyanosis.  HEENT: Normal.   ASSESSMENT & PLAN: 1. RV failure/cor pulmonale: Echo 11/23 showed EF 60-65%, D-shaped septum, moderate RV enlargement with severely decreased RV systolic function, IVC dilated. RHC with low PCWP, elevated right-sided filling pressures, mild PAH, preserved cardiac output. Suspect RV failure/pulmonary hypertension due to parenchymal lung disease (emphysema, smoking-related ILD noted on HRCT 4/23). Last BNP was normal. She is off Tyvaso  and is not feeling any worse off it. Echo 6/25 showed EF 70-75%, G1DD, normal RV. NYHA class III symptoms, feels worse over the last 2 months.  Exam difficult for volume.  Hard for me to tell if her symptoms are related to RV failure/volume overload vs COPD/respiratory bronchiolitis vs PMR.  - I will arrange for RHC for direct assessment of filling pressures.  We discussed risks/benefits and she agrees to procedure.  - I will arrange for echo with worsening symptoms.  - Continue torsemide  20 mg daily. BMET and BNP today.  - Continue spironolactone  12.5 mg daily.  - Now off Farxiga  per PCP over concern for autoimmune DM and increased risk of DKA. 2. 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  CT chests have shown emphysema and smoking-related respiratory bronchiolitis with ILD.  V/Q scan in 11/23 did not show evidence for PE. She started on Tyvaso  for PH-ILD.  However, she is now off Tyvaso  and feels no worse off it.  RV looked improved on echo 12/24.  Echo 6/25 showed RV normal. Respiratory bronchiolitis may have improved off smoking.  - RHC and echo as above.  3. Chronic hypoxemic respiratory failure: Baseline respiratory bronchiolitis-ILD + COPD.  Now off oxygen  since she quit smoking.  - Follows with Pulmonary. 4. COPD: Emphysema on chest imaging.  Quit smoking 11/23.  5. Atrial fibrillation: Paroxysmal. NSR  today.  - Continue Eliquis  5 mg bid. Denies abnormal bleeding.   - Continue bisoprolol  5 mg daily.  6. Cirrhosis: Possible cirrhosis by RUQ US .  This may be due to RV failure.  7. Obesity: Body mass index is 43.1 kg/m. - She is going to be transitioning from semaglutide to tirzepatide.   8. OSA: She plans to do a home sleep study.   Follow up in 6 wks with APP  I spent 41 minutes reviewing records, interviewing/examining patient, and managing orders.     Ezra Shuck,  10/09/2024  "

## 2024-10-09 NOTE — H&P (View-Only) (Signed)
 "  [facility]   Primary Care: Loreli Elsie JONETTA Mickey., MD Pulmonary: Dr. Alaine  HF Cardiologist: Dr. Rolan  Chief complaint: shortness of breath  HPI: 64 y.o. female smoker w/ h/o COPD, HTN, HLD, Type 2 DM, chronic pain syndrome/ fibromyalgia and pulmonary hypertension with RV failure/cor pulmonale.  She was admitted 11/23 with respiratory distress, treated with BiPAP. SCr elevated and unable to get CT chest. Echo showed EF 60-65%, severe RV failure and possible McConnell's sign, RA mod dilated, no MR. Felt respiratory distress related to CHF/RV failure. She was diuresed with IV lasix . RUQ US  limited due to patient habitus but suggestive of possible liver disease/cirrhosis (coarsened hepatic echotexture). She underwent RHC showing low PCWP and elevated right heart pressures, suggesting cor pulmonale. V/Q scan negative for PE. She had a significant oxygen  requirement and lung parenchymal disease felt to be significant. Continued to treat with steroids and oxygen .  GDMT initiated but limited by need for low-dose midodrine . She was discharged home on oxygen , weight 189 lbs.  Zio monitor in 1/24 showed multiple SVT episodes, possible atrial tachycardia, no atrial fibrillation.  Bisoprolol  was started.   CT chest in 2/24 showed possible smoking-related ILD (respiratory bronchiolitis).    Echo 12/24 EF 60-65%, normal RV, IVC normal, unable to estimate PA systolic pressure.   She quit smoking, 11/23.   Of note, no longer on Tyvaso . She stopped Tyvaso  during a severe URI.  When she tried to start it back, she developed severe headaches and worsening flu-like symptoms, so she stayed off it.  She does not feel any worse off Tyvaso .  In fact, she has improved a lot since quitting cigarettes.    Admitted 6/25 with syncope. MRI brain normal, orthostatics negative. Echo showed EF 70-75%, G1DD, normal RV. Syncope felt to be related to hypovolemia. Torsemide  held and given IVF and  discharged home.  Today she returns for HF follow up. She reports 2 months of increased shortness of breath.  She describes dyspnea with most activities around the house like showering and dressing.  She has to take frequent rest breaks.  She reports feeling exhausted all the time.  No orthopnea/PND.  No chest pain.  Occasional episodes of lightheadedness if she stands fast or bends over. Weight up 2 lbs.  She remains on torsemide  20 mg daily.  She is not smoking.  She is not using CPAP, has a home sleep study but has not used yet. She is on prednisone  5 mg daily for polymyalgia rheumatica.   ECG (personally reviewed): NSR, normal.   Labs (3/24): K 4.0, creatinine 1.46 Labs (6/24): K 4.3, creatinine 1.13 Labs (9/24): K 4, creatinine 1.14 Labs (6/25): K 4.0, creatinine 1.09 Labs (7/25): BNP 48, K 4, creatinine 1.33  6 minute walk (3/24): 244 m 6 minute walk (9/24): 305 m  PMH: 1. COPD: Prior smoking.  2. 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   - CT chest (2/24) with suspected respiratory bronchiolitis-related ILD.  3. OSA: Cannot tolerate CPAP.  4. Atrial fibrillation: Paroxysmal.  5. Atrial tachycardia: Zio monitor in 1/24 showed multiple SVT episodes, possible atrial tachycardia, no atrial fibrillation.  6. RV failiure/cor pulmonale:  - Echo (11/23): EF 60-65%, grade I DD,  RV severely reduced systolic function.  - RHC (11/23): RA mean 11, PA 44/19 mean 28, PCWP mean 4, CO/CI (Fick) 5.46/2.65, PVR 4.4, PAPi 2.3  - V/Q scan (11/23): No evidence for chronic PE.  - Echo (12/24): EF 60-65%, normal RV, IVC normal,  unable to estimate PA systolic pressure.  - Echo 6/25: EF 70-75%, G1DD, normal RV. Unable to estimate PA systolic pressure 7. Cirrhosis: Possibly due to RV failure.  8. 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 HTN 10. Hyperlipidemia 11. Fibromyalgia 12. H/o endometrial cancer 13. H/o nephrolithiasis 14. Polymyalgia rheumatica 15. Fe deficiency  anemia.   Current Outpatient Medications  Medication Sig Dispense Refill   acetaminophen  (TYLENOL ) 500 MG tablet Take 1,500 mg by mouth 2 (two) times daily as needed (pain).     apixaban  (ELIQUIS ) 5 MG TABS tablet Take 1 tablet (5 mg total) by mouth 2 (two) times daily. 60 tablet 0   atorvastatin  (LIPITOR) 40 MG tablet Take 40 mg by mouth at bedtime.      bisoprolol  (ZEBETA ) 5 MG tablet TAKE 1 TABLET (5 MG TOTAL) BY MOUTH DAILY. 90 tablet 3   Blood Glucose Monitoring Suppl (ONETOUCH VERIO FLEX SYSTEM) w/Device KIT To use daily to three times daily to check blood sugars for e11.69, one kit for 90 days     cefadroxil  (DURICEF) 500 MG capsule Take 1 capsule (500 mg total) by mouth 2 (two) times daily. 8 capsule 0   clonazePAM  (KLONOPIN ) 0.25 MG disintegrating tablet Take 1 tablet (0.25 mg total) by mouth 2 (two) times daily as needed (anxiety). 60 tablet 2   Continuous Glucose Sensor (FREESTYLE LIBRE 3 PLUS SENSOR) MISC change every 15 days to monitor blood glucose continuously; Duration: 30 days     Cyanocobalamin  (VITAMIN B12) 1000 MCG TABS 1 tablet Orally Once a day     cyclobenzaprine  (FLEXERIL ) 10 MG tablet Take 1 tablet (10 mg total) by mouth at bedtime as needed. 30 tablet 0   dicyclomine  (BENTYL ) 10 MG capsule Take 10 mg by mouth 4 (four) times daily -  before meals and at bedtime.     doxepin  (SINEQUAN ) 75 MG capsule Take 1 capsule (75 mg total) by mouth at bedtime. 30 capsule 2   FLUoxetine  (PROZAC ) 40 MG capsule TAKE 1 CAPSULE BY MOUTH EVERY DAY 30 capsule 2   insulin  degludec (TRESIBA FLEXTOUCH) 200 UNIT/ML FlexTouch Pen Inject 80 Units into the skin daily.     iron polysaccharides (NIFEREX) 150 MG capsule Take 150 mg by mouth daily.     Lancets (ONETOUCH DELICA PLUS LANCET33G) MISC as directed to SMBG three times daily; Duration: 90 days     NOVOLOG  FLEXPEN 100 UNIT/ML FlexPen Inject 10 Units into the skin 3 (three) times daily with meals.     omeprazole (PRILOSEC) 40 MG capsule Take  40 mg by mouth daily before breakfast.   6   ONETOUCH VERIO test strip To use daily to three times daily to check blood sugars for e11.69 for 30 days     predniSONE  (DELTASONE ) 5 MG tablet TAKE 1 TABLET BY MOUTH EVERY DAY WITH BREAKFAST 90 tablet 1   spironolactone  (ALDACTONE ) 25 MG tablet Take 0.5 tablets (12.5 mg total) by mouth daily. 30 tablet 0   tirzepatide (MOUNJARO) 2.5 MG/0.5ML Pen Inject 2.5 mg into the skin once a week.     torsemide  (DEMADEX ) 20 MG tablet Take 1 tablet (20 mg total) by mouth daily.     ziprasidone  (GEODON ) 20 MG capsule Take 1 capsule (20 mg total) by mouth daily after breakfast. 30 capsule 2   No current facility-administered medications for this encounter.   Allergies  Allergen Reactions   Ultram [Tramadol] Other (See Comments)    Per Dr. Dicky is renal failure  Ozempic (0.25 Or 0.5 Mg-Dose) [Semaglutide(0.25 Or 0.5mg -Dos)] Nausea And Vomiting and Other (See Comments)    Stomach pain No appetite    Social History   Socioeconomic History   Marital status: Married    Spouse name: Not on file   Number of children: Not on file   Years of education: Not on file   Highest education level: Not on file  Occupational History   Not on file  Tobacco Use   Smoking status: Former    Current packs/day: 0.00    Average packs/day: 0.5 packs/day    Types: Cigarettes    Quit date: 2010    Years since quitting: 16.0    Passive exposure: Past   Smokeless tobacco: Never   Tobacco comments:    about one pack a day on a bad day  Vaping Use   Vaping status: Former  Substance and Sexual Activity   Alcohol  use: No   Drug use: No   Sexual activity: Never    Birth control/protection: None  Other Topics Concern   Not on file  Social History Narrative   Not on file   Social Drivers of Health   Tobacco Use: Medium Risk (10/08/2024)   Patient History    Smoking Tobacco Use: Former    Smokeless Tobacco Use: Never    Passive Exposure: Past   Physicist, Medical Strain: Not on file  Food Insecurity: No Food Insecurity (03/17/2024)   Epic    Worried About Programme Researcher, Broadcasting/film/video in the Last Year: Never true    Ran Out of Food in the Last Year: Never true  Transportation Needs: No Transportation Needs (03/17/2024)   Epic    Lack of Transportation (Medical): No    Lack of Transportation (Non-Medical): No  Physical Activity: Not on file  Stress: Not on file  Social Connections: Not on file  Intimate Partner Violence: Not At Risk (03/17/2024)   Epic    Fear of Current or Ex-Partner: No    Emotionally Abused: No    Physically Abused: No    Sexually Abused: No  Depression (PHQ2-9): Medium Risk (01/09/2024)   Depression (PHQ2-9)    PHQ-2 Score: 9  Alcohol  Screen: Not on file  Housing: Low Risk (03/17/2024)   Epic    Unable to Pay for Housing in the Last Year: No    Number of Times Moved in the Last Year: 0    Homeless in the Last Year: No  Utilities: Not At Risk (03/17/2024)   Epic    Threatened with loss of utilities: No  Health Literacy: Not on file   Family History  Problem Relation Age of Onset   Diabetes Mother    Throat cancer Mother    Uterine cancer Sister    Deep vein thrombosis Neg Hx    Pulmonary embolism Neg Hx    ROS: All systems reviewed and negative except as per HPI.  Wt Readings from Last 3 Encounters:  10/08/24 117.5 kg (259 lb)  06/13/24 110.3 kg (243 lb 3.2 oz)  04/24/24 107.5 kg (237 lb)   BP 110/62   Pulse 96   Ht 5' 5 (1.651 m)   Wt 117.5 kg (259 lb)   LMP 04/20/2013   SpO2 96%   BMI 43.10 kg/m   PHYSICAL EXAM: General: NAD, obese Neck: Thick, JVP difficult, no thyromegaly or thyroid  nodule.  Lungs: Clear to auscultation bilaterally with normal respiratory effort. CV: Nondisplaced PMI.  Heart regular S1/S2, no S3/S4, no murmur.  No peripheral edema.  No carotid bruit.  Normal pedal pulses.  Abdomen: Soft, nontender, no hepatosplenomegaly, no distention.  Skin: Intact without lesions or  rashes.  Neurologic: Alert and oriented x 3.  Psych: Normal affect. Extremities: No clubbing or cyanosis.  HEENT: Normal.   ASSESSMENT & PLAN: 1. RV failure/cor pulmonale: Echo 11/23 showed EF 60-65%, D-shaped septum, moderate RV enlargement with severely decreased RV systolic function, IVC dilated. RHC with low PCWP, elevated right-sided filling pressures, mild PAH, preserved cardiac output. Suspect RV failure/pulmonary hypertension due to parenchymal lung disease (emphysema, smoking-related ILD noted on HRCT 4/23). Last BNP was normal. She is off Tyvaso  and is not feeling any worse off it. Echo 6/25 showed EF 70-75%, G1DD, normal RV. NYHA class III symptoms, feels worse over the last 2 months.  Exam difficult for volume.  Hard for me to tell if her symptoms are related to RV failure/volume overload vs COPD/respiratory bronchiolitis vs PMR.  - I will arrange for RHC for direct assessment of filling pressures.  We discussed risks/benefits and she agrees to procedure.  - I will arrange for echo with worsening symptoms.  - Continue torsemide  20 mg daily. BMET and BNP today.  - Continue spironolactone  12.5 mg daily.  - Now off Farxiga  per PCP over concern for autoimmune DM and increased risk of DKA. 2. 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  CT chests have shown emphysema and smoking-related respiratory bronchiolitis with ILD.  V/Q scan in 11/23 did not show evidence for PE. She started on Tyvaso  for PH-ILD.  However, she is now off Tyvaso  and feels no worse off it.  RV looked improved on echo 12/24.  Echo 6/25 showed RV normal. Respiratory bronchiolitis may have improved off smoking.  - RHC and echo as above.  3. Chronic hypoxemic respiratory failure: Baseline respiratory bronchiolitis-ILD + COPD.  Now off oxygen  since she quit smoking.  - Follows with Pulmonary. 4. COPD: Emphysema on chest imaging.  Quit smoking 11/23.  5. Atrial fibrillation: Paroxysmal. NSR  today.  - Continue Eliquis  5 mg bid. Denies abnormal bleeding.   - Continue bisoprolol  5 mg daily.  6. Cirrhosis: Possible cirrhosis by RUQ US .  This may be due to RV failure.  7. Obesity: Body mass index is 43.1 kg/m. - She is going to be transitioning from semaglutide to tirzepatide.   8. OSA: She plans to do a home sleep study.   Follow up in 6 wks with APP  I spent 41 minutes reviewing records, interviewing/examining patient, and managing orders.     Ezra Shuck,  10/09/2024  "

## 2024-10-10 ENCOUNTER — Telehealth (HOSPITAL_COMMUNITY): Payer: Self-pay | Admitting: *Deleted

## 2024-10-10 DIAGNOSIS — I5021 Acute systolic (congestive) heart failure: Secondary | ICD-10-CM

## 2024-10-10 DIAGNOSIS — I272 Pulmonary hypertension, unspecified: Secondary | ICD-10-CM

## 2024-10-10 DIAGNOSIS — I50810 Right heart failure, unspecified: Secondary | ICD-10-CM

## 2024-10-10 NOTE — Telephone Encounter (Signed)
 Called patient per Dr. Rolan with following lab results:  Good LDL. Triglycerides high, watch diet. K mildly elevated. Low K diet, repeat BMET in 1 week.  Pt verbalized understanding of same. Repeat lab ordered and scheduled.

## 2024-10-15 ENCOUNTER — Other Ambulatory Visit

## 2024-10-17 NOTE — Progress Notes (Unsigned)
 "  Office Visit Note  Patient: [name]             Date of Birth: August 29, 1961           MRN: [id number]             PCP: Loreli Elsie JONETTA Mickey., MD Referring: Loreli Elsie JONETTA Mickey., MD Visit Date: 10/25/2024   Subjective:  Pain (Follow up)   History of Present Illness: [name] is a 64 y.o. female here for follow up with persistent neck and shoulder pain after recent visit last month for her PMR doing well with prednisone  tapering to 5 mg.   Previous HPI 06/13/2024 [name] is a 64 year old female who presents with persistent neck and shoulder pain after recent visit last month for her PMR doing well with prednisone  tapering to 5 mg.   She has been experiencing constant, sharp neck pain for a couple of weeks, which she describes as straining and sharp, and the pain radiates down her arm and into her shoulder. The pain radiates down her arm and into her shoulder, described as straining.   The pain is tender to touch, especially towards the front of her neck and down her shoulder, and has become intolerable, prompting her to seek further evaluation. There has been no significant improvement with self-directed exercises.       Previous HPI 04/24/24 [name] is a 64 y.o. female here for follow up with polymyalgia rheumatica on prednisone  with gradual tapering prednisone  from 10 mg to 5 mg     She has been experiencing neck pain for a couple of months, particularly when turning her head to the right. The pain is described as a 'pull or a strain' and is exacerbated by sleeping on her left side, which causes a sensation of pressure.   She is currently taking prednisone  at a dose of 5 mg, which has been effective in managing her symptoms without significant side effects. No prolonged morning stiffness is noted, and the pain is primarily triggered by certain activities, such as using her arms.   She previously tried Flexeril  for hip pain, which was helpful, and  the hip pain has not recurred. She denies any recent unusual activities that might have triggered the neck pain and states that this pain does not resemble any previous pain she has experienced.   No swelling or significant bruising is reported, although she notes some bruising on her arms and dry skin.         Previous HPI 01/23/2024 [name] is a 64 y.o. female here for follow up with polymyalgia rheumatica on prednisone  with gradual tapering prednisone  from 10 mg to 5 mg since last visit. She complaints of severe left hip pain today.   She experiences severe pain in her hip, described as 'ten plus' in intensity, which began suddenly upon waking. The pain is primarily located on the side and front of her hip, described as 'crushing'. It worsens with standing and walking, but laying down provides some relief. The pain has progressively worsened since onset and does not radiate past the knee. It extends from the side of the hip to the back, reaching a specific point in the back. Increased tenderness is noted in certain areas when pressure is applied.   She has been tapering her prednisone  dose, currently at 2.5 mg, and reports increased stiffness since the dose reduction. However, the severe pain did not coincide directly with  the dose change, as she has been on the lower dose for a couple of weeks. She has a history of polymyalgia rheumatica, which is managed with prednisone .   She has a history of using Flexeril , a muscle relaxer, without adverse effects. She also reports difficulty sleeping due to the pain and requires assistance from her mother for tasks such as removing her shirt due to limited shoulder mobility, although the shoulders are not painful.   No new symptoms in her shoulders, although she has difficulty with overhead movements. She confirms that the pain does not extend down the back of her leg.      Previous HPI 10/28/2023 [name] is a 64 year old female  with polymyalgia rheumatica on prednisone  with gradual tapering now at 10 mg once daily. She presents with increased stiffness in the neck and shoulders.   She has experienced increased stiffness in her neck and shoulders for the past couple of weeks, since approximately January. The stiffness has worsened following a decrease in her prednisone  dosage. It is present throughout the day and does not radiate to other areas. No numbness, tingling, or swelling is associated with the stiffness.   She has been on prednisone  for several weeks without difficulty. Her inflammation markers, including sedimentation rate and C-reactive protein, were previously elevated but have shown improvement.   She describes shoulder pain as manageable, with the right side being worse than the left. Pain occurs with certain movements, causing pressure but no sharp pain. X-rays indicate narrowing of the acromioclavicular joint space, suggesting osteoarthritis and possible chronic rotator cuff inflammation.     Previous HPI 09/16/23 [name] is a 65 y.o. female here for evaluation of bilateral joint pain and stiffness, predominantly affecting the shoulders and hands and high sedimentation rate. The symptoms began approximately four months ago, initially presenting as a sensation of a pulled muscle in the shoulder, progressing to the point where the patient was unable to lift their arm. Concurrently, the patient noticed increasing difficulty in making a fist, more so in one hand than the other. The symptoms were described as painful, with no visible swelling or color changes noted. The discomfort and limited mobility were consistent throughout the day, with no relief or worsening at any particular time. The patient denied any lower extremity involvement.   Despite initial advice to continue using the affected joints, the patient's symptoms persisted, leading to a loss of function in the arms and hands. The patient also  reported dropping objects and difficulty opening pill bottles. Numbness in the hands was noted during this period, but has since resolved.   The patient was started on prednisone  20 mg following blood work that showed a high sedimentation rate. Improvement in symptoms was noted after approximately three days of steroid therapy, with continued gradual improvement since then. The patient has been on prednisone  consistently for about two months. The main side effect noted was worsening of diabetes control.   She has a history of fibromyalgia but does not feel current symptoms are typical of her chronic muscle or joint pains. The patient has no history of similar episodes, injuries, or surgeries in the neck or shoulders. The patient has not had any imaging studies of the affected joints.    Labs reviewed 06/2023 ESR 65 CK 74   07/2022 RF 24.3 ANA neg   Review of Systems  Constitutional:  Positive for fatigue.  HENT:  Negative for mouth sores and mouth dryness.   Eyes:  Negative for dryness.  Respiratory:  Positive for shortness of breath.   Cardiovascular:  Negative for chest pain and palpitations.  Gastrointestinal:  Negative for blood in stool, constipation and diarrhea.  Endocrine: Negative for increased urination.  Genitourinary:  Negative for involuntary urination.  Musculoskeletal:  Positive for joint pain, joint pain, joint swelling, myalgias, muscle weakness, muscle tenderness and myalgias. Negative for gait problem and morning stiffness.  Skin:  Positive for color change and hair loss. Negative for rash and sensitivity to sunlight.  Allergic/Immunologic: Negative for susceptible to infections.  Neurological:  Positive for dizziness. Negative for headaches.  Hematological:  Negative for swollen glands.  Psychiatric/Behavioral:  Positive for sleep disturbance. Negative for depressed mood. The patient is nervous/anxious.     PMFS History:  Patient Active Problem List   Diagnosis  Date Noted   Neck pain on right side 04/24/2024   Snoring 04/03/2024   Syncope and collapse 03/16/2024   PMR (polymyalgia rheumatica) 01/23/2024   Lateral pain of left hip 01/23/2024   Polyarthritis 09/16/2023   Bilateral shoulder pain 09/16/2023   Paroxysmal atrial fibrillation (HCC) 07/27/2022   CHF (congestive heart failure), NYHA class III, acute, systolic (HCC) 07/23/2022   Acute on chronic diastolic CHF (congestive heart failure) (HCC) 07/23/2022   Type 2 diabetes mellitus with hyperlipidemia (HCC) 01/09/2019   Essential hypertension 12/12/2018   Hyperlipidemia 12/12/2018   Preoperative clearance 12/12/2018   Endometrial cancer (HCC) 11/15/2018   Incisional hernia, without obstruction or gangrene 02/04/2014   Post-op pain 07/23/2013   Postoperative wound infection 05/25/2013   Wound disruption, post-op, skin 01/30/2013   Preoperative respiratory examination 12/18/2012   Smoking 12/18/2012   Heme positive stool 11/25/2012   Hypokalemia 11/25/2012   COPD (chronic obstructive pulmonary disease) (HCC)    Acid reflux disease    Post-operative state 11/21/2012   AKI (acute kidney injury) 11/04/2012   HAP (hospital-acquired pneumonia) 11/03/2012   Acute pulmonary edema (HCC) 11/03/2012   Acute respiratory failure with hypoxia (HCC) 11/02/2012   Acute exacerbation of COPD with asthma (HCC) 11/02/2012   Parastomal hernia 11/02/2012   Fibromyalgia 11/02/2012   Generalized anxiety disorder 11/02/2012   Restless legs syndrome 11/02/2012   Class 2 obesity 11/02/2012   Leukocytosis 11/02/2012   Hyperglycemia 11/02/2012   Cigarette nicotine  dependence with withdrawal 11/02/2012   Complete atelectasis 11/02/2012    Past Medical History:  Diagnosis Date   Acid reflux disease    Anemia 03/27/2024   Anxiety    Asthma    Bronchitis, chronic (HCC)    Effects worse with URI   Chronic pain syndrome    COPD (chronic obstructive pulmonary disease) (HCC)    Depression    Diabetes  mellitus without complication (HCC)    borderline, type 2   Diverticulitis    Dysplasia of cervix (uteri)    patient states she had dysplasia of the uterus stage 4   Endometrial cancer (HCC)    Fibromyalgia    Fibromyalgia    GERD (gastroesophageal reflux disease)    Headache(784.0)    migraine headache   Hyperlipidemia    Hypertension    Kidney stones    at least 6 stones in past   Pneumonia 2019   Restless leg syndrome    Bilateral   Vomiting    last night at 2300 after last round of antibiotic    Family History  Problem Relation Age of Onset   Diabetes Mother    Throat cancer Mother    Uterine cancer Sister  Deep vein thrombosis Neg Hx    Pulmonary embolism Neg Hx    Past Surgical History:  Procedure Laterality Date   ABDOMINAL HYSTERECTOMY     APPENDECTOMY     APPLICATION OF WOUND VAC N/A 11/01/2012   Procedure: APPLICATION OF WOUND VAC;  Surgeon: Debby LABOR. Cornett, MD;  Location: [facility];  Service: General;  Laterality: N/A;   CHOLECYSTECTOMY     COLONOSCOPY  2013   COLOSTOMY     COLOSTOMY CLOSURE N/A 11/01/2012   Procedure: COLOSTOMY CLOSURE;  Surgeon: Debby LABOR. Cornett, MD;  Location: [facility];  Service: General;  Laterality: N/A;  REPAIR PARASTOMAL HERNIA; REVISION OF COLOSTOMY   COLOSTOMY CLOSURE N/A 05/03/2013   Procedure: COLOSTOMY CLOSURE;  Surgeon: Debby LABOR. Cornett, MD;  Location: [facility];  Service: General;  Laterality: N/A;   DILATION AND CURETTAGE OF UTERUS N/A 05/24/2019   Procedure: DILATATION AND CURETTAGE;  Surgeon: Shannon Agent, MD;  Location: [facility];  Service: Gynecology;  Laterality: N/A;   ESOPHAGOGASTRODUODENOSCOPY Left 11/27/2012   Procedure: ESOPHAGOGASTRODUODENOSCOPY (EGD);  Surgeon: Agent LITTIE Celestia Mickey., MD;  Location: [facility];  Service: Gastroenterology;  Laterality: Left;   INCISIONAL HERNIA REPAIR N/A 11/01/2012   Procedure: HERNIA REPAIR INCISIONAL;  Surgeon: Debby LABOR. Cornett, MD;  Location: [facility];  Service: General;  Laterality: N/A;    LAPAROTOMY  11/01/2012   Procedure: EXPLORATORY LAPAROTOMY;  Surgeon: Debby LABOR. Cornett, MD;  Location: [facility];  Service: General;;  exploratory laparotomy,repair of parastomal hernia and incisional hernia, lysis of adhesions, and application of wound V.A.C.   LYSIS OF ADHESION N/A 11/01/2012   Procedure: LYSIS OF ADHESION;  Surgeon: Debby LABOR. Cornett, MD;  Location: [facility];  Service: General;  Laterality: N/A;   OPERATIVE ULTRASOUND N/A 06/14/2019   Procedure: OPERATIVE ULTRASOUND;  Surgeon: Shannon Agent, MD;  Location: [facility];  Service: Urology;  Laterality: N/A;   PARASTOMAL HERNIA REPAIR N/A 11/01/2012   Procedure: HERNIA REPAIR PARASTOMAL;  Surgeon: Debby LABOR. Cornett, MD;  Location: [facility];  Service: General;  Laterality: N/A;   PARTIAL HYSTERECTOMY     partial   RIGHT HEART CATH N/A 07/26/2022   Procedure: RIGHT HEART CATH;  Surgeon: Rolan Ezra RAMAN, MD;  Location: [facility];  Service: Cardiovascular;  Laterality: N/A;   RIGHT HEART CATH N/A 10/24/2024   Procedure: RIGHT HEART CATH;  Surgeon: Rolan Ezra RAMAN, MD;  Location: [facility] INVASIVE CV LAB;  Service: Cardiovascular;  Laterality: N/A;   TANDEM RING INSERTION N/A 05/24/2019   Procedure: INSERTION OF HYMEN CAPSULE;  Surgeon: Shannon Agent, MD;  Location: [facility];  Service: Gynecology;  Laterality: N/A;   TANDEM RING INSERTION N/A 06/14/2019   Procedure: HYMEN CAPSULE PLACEMENT;  Surgeon: Shannon Agent, MD;  Location: [facility];  Service: Urology;  Laterality: N/A;   Social History   Social History Narrative   Not on file   Immunization History  Administered Date(s) Administered   Influenza,inj,Quad PF,6+ Mos 06/08/2017   Moderna Sars-Covid-2 Vaccination 05/16/2020   Pneumococcal Polysaccharide-23 07/22/2011   Tdap 07/01/2015     Objective: Vital Signs: BP 107/68   Pulse 90   Temp (!) 96.9 F (36.1 C)   Resp 14   Ht 5' 5 (1.651 m)   Wt 259 lb 12.8 oz (117.8 kg)   LMP 04/20/2013   BMI 43.23 kg/m    Physical Exam    Musculoskeletal Exam: ***   Investigation: No additional findings.  Imaging: CARDIAC CATHETERIZATION Result Date: 10/24/2024 1. Normal filling pressures.  2. Normal PA pressure. 3. Normal cardiac output. Normal study.  Look for non-cardiac causes of dyspnea.    Recent Labs: Lab Results  Component Value Date   WBC 9.2 10/24/2024   HGB 10.2 (L) 10/24/2024   HGB 10.2 (L) 10/24/2024   PLT 238 10/24/2024   NA 140 10/24/2024   NA 140 10/24/2024   K 4.0 10/24/2024   K 4.0 10/24/2024   CL 99 10/08/2024   CO2 20 (L) 10/08/2024   GLUCOSE 192 (H) 10/08/2024   BUN 38 (H) 10/08/2024   CREATININE 1.52 (H) 10/08/2024   BILITOT 0.4 03/17/2024   ALKPHOS 54 03/17/2024   AST 14 (L) 03/17/2024   ALT 5 03/17/2024   PROT 6.1 (L) 03/17/2024   ALBUMIN  3.1 (L) 03/17/2024   CALCIUM  9.4 10/08/2024   GFRAA >60 11/22/2019    Speciality Comments: No specialty comments available.  Procedures:  No procedures performed Allergies: Ultram [tramadol] and Ozempic (0.25 or 0.5 mg-dose) [semaglutide(0.25 or 0.5mg -dos)]   Assessment / Plan:     Visit Diagnoses:  Assessment & Plan Chronic pain of both shoulders      ***  Follow-Up Instructions: No follow-ups on file.   Lonni LELON Ester, MD  Note - This record has been created using [employer].  Chart creation errors have been sought, but may not always  have been located. Such creation errors do not reflect on  the standard of medical care. "

## 2024-10-17 NOTE — Assessment & Plan Note (Addendum)
 SABRA

## 2024-10-22 ENCOUNTER — Ambulatory Visit (HOSPITAL_COMMUNITY)

## 2024-10-24 ENCOUNTER — Ambulatory Visit (HOSPITAL_COMMUNITY)
Admission: RE | Admit: 2024-10-24 | Discharge: 2024-10-24 | Disposition: A | Discharge status: Home / Self Care | Attending: Cardiology | Admitting: Cardiology

## 2024-10-24 ENCOUNTER — Encounter (HOSPITAL_COMMUNITY)
Admission: RE | Disposition: A | Discharge status: Home / Self Care | Payer: Self-pay | Source: Home / Self Care | Attending: Cardiology

## 2024-10-24 ENCOUNTER — Other Ambulatory Visit: Payer: Self-pay

## 2024-10-24 ENCOUNTER — Encounter (HOSPITAL_COMMUNITY): Payer: Self-pay | Admitting: Cardiology

## 2024-10-24 DIAGNOSIS — Z79899 Other long term (current) drug therapy: Secondary | ICD-10-CM | POA: Insufficient documentation

## 2024-10-24 DIAGNOSIS — I2729 Other secondary pulmonary hypertension: Secondary | ICD-10-CM | POA: Insufficient documentation

## 2024-10-24 DIAGNOSIS — I5033 Acute on chronic diastolic (congestive) heart failure: Secondary | ICD-10-CM

## 2024-10-24 DIAGNOSIS — Z6841 Body Mass Index (BMI) 40.0 and over, adult: Secondary | ICD-10-CM | POA: Insufficient documentation

## 2024-10-24 DIAGNOSIS — J439 Emphysema, unspecified: Secondary | ICD-10-CM | POA: Insufficient documentation

## 2024-10-24 DIAGNOSIS — M797 Fibromyalgia: Secondary | ICD-10-CM | POA: Insufficient documentation

## 2024-10-24 DIAGNOSIS — I5021 Acute systolic (congestive) heart failure: Secondary | ICD-10-CM

## 2024-10-24 DIAGNOSIS — I480 Paroxysmal atrial fibrillation: Secondary | ICD-10-CM | POA: Insufficient documentation

## 2024-10-24 DIAGNOSIS — Z7985 Long-term (current) use of injectable non-insulin antidiabetic drugs: Secondary | ICD-10-CM | POA: Insufficient documentation

## 2024-10-24 DIAGNOSIS — J440 Chronic obstructive pulmonary disease with acute lower respiratory infection: Secondary | ICD-10-CM | POA: Insufficient documentation

## 2024-10-24 DIAGNOSIS — G4733 Obstructive sleep apnea (adult) (pediatric): Secondary | ICD-10-CM | POA: Insufficient documentation

## 2024-10-24 DIAGNOSIS — I5022 Chronic systolic (congestive) heart failure: Secondary | ICD-10-CM | POA: Insufficient documentation

## 2024-10-24 DIAGNOSIS — K7460 Unspecified cirrhosis of liver: Secondary | ICD-10-CM | POA: Insufficient documentation

## 2024-10-24 DIAGNOSIS — Z7901 Long term (current) use of anticoagulants: Secondary | ICD-10-CM | POA: Insufficient documentation

## 2024-10-24 DIAGNOSIS — I110 Hypertensive heart disease with heart failure: Secondary | ICD-10-CM | POA: Insufficient documentation

## 2024-10-24 DIAGNOSIS — E119 Type 2 diabetes mellitus without complications: Secondary | ICD-10-CM | POA: Insufficient documentation

## 2024-10-24 DIAGNOSIS — G894 Chronic pain syndrome: Secondary | ICD-10-CM | POA: Insufficient documentation

## 2024-10-24 DIAGNOSIS — Z87891 Personal history of nicotine dependence: Secondary | ICD-10-CM | POA: Insufficient documentation

## 2024-10-24 DIAGNOSIS — I2781 Cor pulmonale (chronic): Secondary | ICD-10-CM | POA: Insufficient documentation

## 2024-10-24 DIAGNOSIS — E785 Hyperlipidemia, unspecified: Secondary | ICD-10-CM | POA: Insufficient documentation

## 2024-10-24 DIAGNOSIS — E669 Obesity, unspecified: Secondary | ICD-10-CM | POA: Insufficient documentation

## 2024-10-24 DIAGNOSIS — I272 Pulmonary hypertension, unspecified: Secondary | ICD-10-CM

## 2024-10-24 LAB — POCT I-STAT EG7
Acid-base deficit: 1 mmol/L (ref 0.0–2.0)
Acid-base deficit: 1 mmol/L (ref 0.0–2.0)
Bicarbonate: 23.1 mmol/L (ref 20.0–28.0)
Bicarbonate: 23.3 mmol/L (ref 20.0–28.0)
Calcium, Ion: 1.23 mmol/L (ref 1.15–1.40)
Calcium, Ion: 1.26 mmol/L (ref 1.15–1.40)
HCT: 30 % — ABNORMAL LOW (ref 36.0–46.0)
HCT: 30 % — ABNORMAL LOW (ref 36.0–46.0)
Hemoglobin: 10.2 g/dL — ABNORMAL LOW (ref 12.0–15.0)
Hemoglobin: 10.2 g/dL — ABNORMAL LOW (ref 12.0–15.0)
O2 Saturation: 56 %
O2 Saturation: 60 %
Potassium: 4 mmol/L (ref 3.5–5.1)
Potassium: 4 mmol/L (ref 3.5–5.1)
Sodium: 140 mmol/L (ref 135–145)
Sodium: 140 mmol/L (ref 135–145)
TCO2: 24 mmol/L (ref 22–32)
TCO2: 24 mmol/L (ref 22–32)
pCO2, Ven: 36.9 mmHg — ABNORMAL LOW (ref 44–60)
pCO2, Ven: 37 mmHg — ABNORMAL LOW (ref 44–60)
pH, Ven: 7.404 (ref 7.25–7.43)
pH, Ven: 7.407 (ref 7.25–7.43)
pO2, Ven: 29 mmHg — CL (ref 32–45)
pO2, Ven: 31 mmHg — CL (ref 32–45)

## 2024-10-24 LAB — CBC
HCT: 31.9 % — ABNORMAL LOW (ref 36.0–46.0)
Hemoglobin: 9.1 g/dL — ABNORMAL LOW (ref 12.0–15.0)
MCH: 22 pg — ABNORMAL LOW (ref 26.0–34.0)
MCHC: 28.5 g/dL — ABNORMAL LOW (ref 30.0–36.0)
MCV: 77.1 fL — ABNORMAL LOW (ref 80.0–100.0)
Platelets: 238 10*3/uL (ref 150–400)
RBC: 4.14 MIL/uL (ref 3.87–5.11)
RDW: 19 % — ABNORMAL HIGH (ref 11.5–15.5)
WBC: 9.2 10*3/uL (ref 4.0–10.5)
nRBC: 0.4 % — ABNORMAL HIGH (ref 0.0–0.2)

## 2024-10-24 LAB — GLUCOSE, CAPILLARY
Glucose-Capillary: 241 mg/dL — ABNORMAL HIGH (ref 70–99)
Glucose-Capillary: 227 mg/dL — ABNORMAL HIGH (ref 70–99)

## 2024-10-24 MED ORDER — SODIUM CHLORIDE 0.9% FLUSH: 3.0000 mL | INTRAVENOUS | Status: DC | PRN

## 2024-10-24 MED ORDER — SODIUM CHLORIDE 0.9 % IV SOLN: 250.0000 mL | INTRAVENOUS | Status: DC | PRN

## 2024-10-24 MED ORDER — HYDRALAZINE HCL 20 MG/ML IJ SOLN: 10.0000 mg | INTRAMUSCULAR | Status: DC | PRN

## 2024-10-24 MED ORDER — LABETALOL HCL 5 MG/ML IV SOLN: 10.0000 mg | INTRAVENOUS | Status: DC | PRN

## 2024-10-24 MED ORDER — LIDOCAINE HCL (PF) 1 % IJ SOLN
INTRAMUSCULAR | Status: DC | PRN
  Administered 2024-10-24: 5 mL

## 2024-10-24 MED ORDER — ACETAMINOPHEN 325 MG PO TABS: 650.0000 mg | ORAL_TABLET | ORAL | Status: DC | PRN

## 2024-10-24 MED ORDER — ONDANSETRON HCL 4 MG/2ML IJ SOLN: 4.0000 mg | Freq: Four times a day (QID) | INTRAMUSCULAR | Status: DC | PRN

## 2024-10-24 MED ORDER — SODIUM CHLORIDE 0.9% FLUSH: 3.0000 mL | Freq: Two times a day (BID) | INTRAVENOUS | Status: DC

## 2024-10-24 MED ORDER — LIDOCAINE HCL (PF) 1 % IJ SOLN
INTRAMUSCULAR | Status: AC
  Filled 2024-10-24: qty 30

## 2024-10-24 MED ORDER — FREE WATER: 250.0000 mL | Freq: Once | Status: DC

## 2024-10-24 MED ORDER — HEPARIN (PORCINE) IN NACL 1000-0.9 UT/500ML-% IV SOLN
INTRAVENOUS | Status: DC | PRN
  Administered 2024-10-24: 500 mL

## 2024-10-24 NOTE — Interval H&P Note (Signed)
 History and Physical Interval Note:  10/24/2024 9:59 AM  [name]  has presented today for surgery, with the diagnosis of chf.  The various methods of treatment have been discussed with the patient and family. After consideration of risks, benefits and other options for treatment, the patient has consented to  Procedures: RIGHT HEART CATH (N/A) as a surgical intervention.  The patient's history has been reviewed, patient examined, no change in status, stable for surgery.  I have reviewed the patient's chart and labs.  Questions were answered to the patient's satisfaction.     Tresea Heine [employer]

## 2024-10-24 NOTE — Discharge Instructions (Signed)

## 2024-10-25 ENCOUNTER — Ambulatory Visit

## 2024-10-25 ENCOUNTER — Encounter: Payer: Self-pay | Admitting: Internal Medicine

## 2024-10-25 ENCOUNTER — Ambulatory Visit: Admitting: Internal Medicine

## 2024-10-25 VITALS — BP 107/68 | HR 90 | Temp 96.9°F | Resp 14 | Ht 65.0 in | Wt 259.8 lb

## 2024-10-25 DIAGNOSIS — J849 Interstitial pulmonary disease, unspecified: Secondary | ICD-10-CM

## 2024-10-25 DIAGNOSIS — M353 Polymyalgia rheumatica: Secondary | ICD-10-CM

## 2024-10-25 DIAGNOSIS — G8929 Other chronic pain: Secondary | ICD-10-CM

## 2024-10-25 DIAGNOSIS — Z79899 Other long term (current) drug therapy: Secondary | ICD-10-CM

## 2024-10-25 DIAGNOSIS — M130 Polyarthritis, unspecified: Secondary | ICD-10-CM

## 2024-10-26 ENCOUNTER — Ambulatory Visit: Admission: RE | Admit: 2024-10-26 | Source: Ambulatory Visit

## 2024-10-26 ENCOUNTER — Ambulatory Visit (HOSPITAL_COMMUNITY): Admission: RE | Admit: 2024-10-26

## 2024-10-26 DIAGNOSIS — D649 Anemia, unspecified: Secondary | ICD-10-CM

## 2024-10-26 DIAGNOSIS — K2930 Chronic superficial gastritis without bleeding: Secondary | ICD-10-CM

## 2024-10-26 DIAGNOSIS — I50810 Right heart failure, unspecified: Secondary | ICD-10-CM

## 2024-10-26 DIAGNOSIS — I5021 Acute systolic (congestive) heart failure: Secondary | ICD-10-CM

## 2024-10-26 DIAGNOSIS — I272 Pulmonary hypertension, unspecified: Secondary | ICD-10-CM

## 2024-10-26 LAB — COMPREHENSIVE METABOLIC PANEL WITH GFR
AG Ratio: 1.6 (calc) (ref 1.0–2.5)
ALT: 4 U/L — ABNORMAL LOW (ref 6–29)
AST: 10 U/L (ref 10–35)
Albumin: 4 g/dL (ref 3.6–5.1)
Alkaline phosphatase (APISO): 62 U/L (ref 37–153)
BUN/Creatinine Ratio: 21 (calc) (ref 6–22)
BUN: 24 mg/dL (ref 7–25)
CO2: 23 mmol/L (ref 20–32)
Calcium: 9.5 mg/dL (ref 8.6–10.4)
Chloride: 103 mmol/L (ref 98–110)
Creat: 1.17 mg/dL — ABNORMAL HIGH (ref 0.50–1.05)
Globulin: 2.5 g/dL (ref 1.9–3.7)
Glucose, Bld: 250 mg/dL — ABNORMAL HIGH (ref 65–99)
Potassium: 4.7 mmol/L (ref 3.5–5.3)
Sodium: 137 mmol/L (ref 135–146)
Total Bilirubin: 0.3 mg/dL (ref 0.2–1.2)
Total Protein: 6.5 g/dL (ref 6.1–8.1)
eGFR: 52 mL/min/{1.73_m2} — ABNORMAL LOW

## 2024-10-26 LAB — CBC WITH DIFFERENTIAL/PLATELET
Absolute Lymphocytes: 3039 {cells}/uL (ref 850–3900)
Absolute Monocytes: 519 {cells}/uL (ref 200–950)
Basophils Absolute: 36 {cells}/uL (ref 0–200)
Basophils Relative: 0.4 %
Eosinophils Absolute: 82 {cells}/uL (ref 15–500)
Eosinophils Relative: 0.9 %
HCT: 32.5 % — ABNORMAL LOW (ref 35.9–46.0)
Hemoglobin: 9 g/dL — ABNORMAL LOW (ref 11.7–15.5)
MCH: 21 pg — ABNORMAL LOW (ref 27.0–33.0)
MCHC: 27.7 g/dL — ABNORMAL LOW (ref 31.6–35.4)
MCV: 75.9 fL — ABNORMAL LOW (ref 81.4–101.7)
MPV: 10.7 fL (ref 7.5–12.5)
Monocytes Relative: 5.7 %
Neutro Abs: 5424 {cells}/uL (ref 1500–7800)
Neutrophils Relative %: 59.6 %
Platelets: 218 10*3/uL (ref 140–400)
RBC: 4.28 Million/uL (ref 3.80–5.10)
RDW: 17.8 % — ABNORMAL HIGH (ref 11.0–15.0)
Total Lymphocyte: 33.4 %
WBC: 9.1 10*3/uL (ref 3.8–10.8)

## 2024-10-26 LAB — SEDIMENTATION RATE: Sed Rate: 33 mm/h — ABNORMAL HIGH (ref 0–30)

## 2024-10-26 LAB — BASIC METABOLIC PANEL WITH GFR
Anion gap: 18 — ABNORMAL HIGH (ref 5–15)
BUN: 19 mg/dL (ref 8–23)
CO2: 16 mmol/L — ABNORMAL LOW (ref 22–32)
Calcium: 8.8 mg/dL — ABNORMAL LOW (ref 8.9–10.3)
Chloride: 104 mmol/L (ref 98–111)
Creatinine, Ser: 1.1 mg/dL — ABNORMAL HIGH (ref 0.44–1.00)
GFR, Estimated: 55 mL/min — ABNORMAL LOW
Glucose, Bld: 255 mg/dL — ABNORMAL HIGH (ref 70–99)
Potassium: 4.5 mmol/L (ref 3.5–5.1)
Sodium: 138 mmol/L (ref 135–145)

## 2024-10-26 LAB — C-REACTIVE PROTEIN: CRP: 21.9 mg/L — ABNORMAL HIGH

## 2024-10-26 LAB — RHEUMATOID FACTOR: Rheumatoid fact SerPl-aCnc: <10 [IU]/mL

## 2024-11-08 ENCOUNTER — Other Ambulatory Visit

## 2024-11-20 ENCOUNTER — Ambulatory Visit (HOSPITAL_COMMUNITY)

## 2024-11-20 ENCOUNTER — Other Ambulatory Visit (HOSPITAL_COMMUNITY)

## 2024-12-04 ENCOUNTER — Ambulatory Visit: Admitting: Podiatry

## 2025-01-07 ENCOUNTER — Telehealth (HOSPITAL_COMMUNITY): Admitting: Psychiatry

## 2025-01-23 ENCOUNTER — Ambulatory Visit: Admitting: Internal Medicine
